# Patient Record
Sex: Female | Born: 1950 | Race: Black or African American | Hispanic: No | Smoking: Never smoker
Health system: Southern US, Community
[De-identification: ages and names within clinical notes are randomized; demographics above are authoritative.]

## PROBLEM LIST (undated history)

## (undated) DIAGNOSIS — J189 Pneumonia, unspecified organism: Secondary | ICD-10-CM

## (undated) DIAGNOSIS — R7303 Prediabetes: Secondary | ICD-10-CM

## (undated) DIAGNOSIS — I10 Essential (primary) hypertension: Secondary | ICD-10-CM

## (undated) DIAGNOSIS — D649 Anemia, unspecified: Secondary | ICD-10-CM

## (undated) DIAGNOSIS — M1990 Unspecified osteoarthritis, unspecified site: Secondary | ICD-10-CM

## (undated) DIAGNOSIS — M5430 Sciatica, unspecified side: Secondary | ICD-10-CM

## (undated) DIAGNOSIS — U071 COVID-19: Secondary | ICD-10-CM

## (undated) DIAGNOSIS — C801 Malignant (primary) neoplasm, unspecified: Secondary | ICD-10-CM

## (undated) DIAGNOSIS — N189 Chronic kidney disease, unspecified: Secondary | ICD-10-CM

## (undated) DIAGNOSIS — F419 Anxiety disorder, unspecified: Secondary | ICD-10-CM

## (undated) HISTORY — PX: COLONOSCOPY W/ POLYPECTOMY: SHX1380

## (undated) HISTORY — PX: ECTOPIC PREGNANCY SURGERY: SHX613

## (undated) HISTORY — PX: FEMUR SURGERY: SHX943

## (undated) HISTORY — DX: Essential (primary) hypertension: I10

## (undated) HISTORY — DX: Prediabetes: R73.03

## (undated) HISTORY — PX: TUBAL LIGATION: SHX77

## (undated) HISTORY — DX: COVID-19: U07.1

## (undated) MED FILL — Fosaprepitant Dimeglumine For IV Infusion 150 MG (Base Eq): INTRAVENOUS | Qty: 5 | Status: AC

---

## 2009-04-05 ENCOUNTER — Ambulatory Visit: Payer: Self-pay | Admitting: Unknown Physician Specialty

## 2009-07-04 ENCOUNTER — Ambulatory Visit: Payer: Self-pay | Admitting: Internal Medicine

## 2009-10-09 LAB — HM PAP SMEAR: HM Pap smear: NORMAL

## 2010-11-15 ENCOUNTER — Ambulatory Visit: Payer: Self-pay | Admitting: Internal Medicine

## 2010-11-29 ENCOUNTER — Ambulatory Visit: Payer: Self-pay | Admitting: Internal Medicine

## 2010-12-11 ENCOUNTER — Telehealth: Payer: Self-pay | Admitting: Internal Medicine

## 2010-12-19 NOTE — Telephone Encounter (Signed)
Please make pt a follow up appointment to discuss blood pressure issues.

## 2010-12-27 NOTE — Telephone Encounter (Signed)
Spoke with pt 12/27/10.  Pt stated bp was under control  She would call back to make appointment if she has any problems/rbh

## 2011-06-21 ENCOUNTER — Other Ambulatory Visit: Payer: Self-pay | Admitting: Internal Medicine

## 2011-06-21 NOTE — Telephone Encounter (Signed)
NEEDS OV 

## 2011-08-22 ENCOUNTER — Other Ambulatory Visit: Payer: Self-pay | Admitting: Internal Medicine

## 2011-09-06 ENCOUNTER — Telehealth: Payer: Self-pay | Admitting: Internal Medicine

## 2011-09-06 MED ORDER — LOSARTAN POTASSIUM 50 MG PO TABS: 50.0000 mg | ORAL_TABLET | Freq: Every day | ORAL | Status: DC

## 2011-09-06 NOTE — Telephone Encounter (Signed)
Patient informed, Rx done.

## 2011-09-06 NOTE — Telephone Encounter (Signed)
[phone] before5    [phone] after 5 Pt called stated she left voice mail yesterday regarding About bp meds Her ent wanted her to  discontinue  her linsipril  Because of cough and wanted dr walker prescribe something differed [facility] Pt has not taken bp meds in a week bp is going up

## 2011-09-06 NOTE — Telephone Encounter (Signed)
We can call in Losartan 50mg  daily. She needs BMP drawn next week. Follow up within 1 month.

## 2011-10-03 ENCOUNTER — Other Ambulatory Visit: Payer: Self-pay

## 2011-10-10 ENCOUNTER — Ambulatory Visit (INDEPENDENT_AMBULATORY_CARE_PROVIDER_SITE_OTHER)
Admission: RE | Admit: 2011-10-10 | Discharge: 2011-10-10 | Disposition: A | Discharge status: Home / Self Care | Payer: PRIVATE HEALTH INSURANCE | Source: Ambulatory Visit | Attending: Internal Medicine | Admitting: Internal Medicine

## 2011-10-10 ENCOUNTER — Ambulatory Visit (INDEPENDENT_AMBULATORY_CARE_PROVIDER_SITE_OTHER): Payer: PRIVATE HEALTH INSURANCE | Admitting: Internal Medicine

## 2011-10-10 ENCOUNTER — Encounter: Payer: Self-pay | Admitting: Internal Medicine

## 2011-10-10 VITALS — BP 130/90 | HR 91 | Temp 98.4°F | Ht 59.0 in | Wt 157.5 lb

## 2011-10-10 DIAGNOSIS — Z1211 Encounter for screening for malignant neoplasm of colon: Secondary | ICD-10-CM | POA: Insufficient documentation

## 2011-10-10 DIAGNOSIS — R059 Cough, unspecified: Secondary | ICD-10-CM

## 2011-10-10 DIAGNOSIS — R05 Cough: Secondary | ICD-10-CM | POA: Insufficient documentation

## 2011-10-10 DIAGNOSIS — I10 Essential (primary) hypertension: Secondary | ICD-10-CM

## 2011-10-10 DIAGNOSIS — R053 Chronic cough: Secondary | ICD-10-CM

## 2011-10-10 DIAGNOSIS — Z0000 Encounter for general adult medical examination without abnormal findings: Secondary | ICD-10-CM

## 2011-10-10 LAB — CBC WITH DIFFERENTIAL/PLATELET
Basophils Relative: 0.8 % (ref 0.0–3.0)
Eosinophils Absolute: 0 10*3/uL (ref 0.0–0.7)
Eosinophils Relative: 1.4 % (ref 0.0–5.0)
Hemoglobin: 12.6 g/dL (ref 12.0–15.0)
Lymphocytes Relative: 46.4 % — ABNORMAL HIGH (ref 12.0–46.0)
Monocytes Relative: 8.6 % (ref 3.0–12.0)
Neutro Abs: 1.5 10*3/uL (ref 1.4–7.7)
Neutrophils Relative %: 42.8 % — ABNORMAL LOW (ref 43.0–77.0)
RBC: 4.03 Mil/uL (ref 3.87–5.11)
WBC: 3.5 10*3/uL — ABNORMAL LOW (ref 4.5–10.5)

## 2011-10-10 LAB — LIPID PANEL
HDL: 66.5 mg/dL (ref >39.00)
Total CHOL/HDL Ratio: 2
Triglycerides: 73 mg/dL (ref 0.0–149.0)
VLDL: 14.6 mg/dL (ref 0.0–40.0)

## 2011-10-10 LAB — COMPREHENSIVE METABOLIC PANEL
ALT: 24 U/L (ref 0–35)
AST: 26 U/L (ref 0–37)
Alkaline Phosphatase: 87 U/L (ref 39–117)
Calcium: 9.2 mg/dL (ref 8.4–10.5)
Chloride: 110 mEq/L (ref 96–112)
Creatinine, Ser: 0.7 mg/dL (ref 0.4–1.2)
Total Bilirubin: 0.5 mg/dL (ref 0.3–1.2)

## 2011-10-10 LAB — MICROALBUMIN / CREATININE URINE RATIO
Creatinine,U: 24.8 mg/dL
Microalb Creat Ratio: 0.8 mg/g (ref 0.0–30.0)

## 2011-10-10 MED ORDER — LOSARTAN POTASSIUM 50 MG PO TABS: 50.0000 mg | ORAL_TABLET | Freq: Every day | ORAL | Status: DC

## 2011-10-10 NOTE — Assessment & Plan Note (Signed)
Blood pressure fairly well-controlled today. We'll continue to monitor. If consistently greater than 140/90, will increase dose of losartan to 100 mg. We'll check renal function with labs today. Followup in one month.

## 2011-10-10 NOTE — Assessment & Plan Note (Signed)
Patient with chronic cough which was initially evaluated by an ENT physician. Patient reports that ENT evaluation is normal. Exam today is normal. No improvement in cough with change from lisinopril to losartan. Patient has no history of smoking or exposure to tobacco smoke. However, chest x-ray today shows emphysematous changes. Will set up with pulmonary medicine for evaluation to include PFTs. Question if she might benefit from long-acting bronchodilator and steroid.

## 2011-10-10 NOTE — Progress Notes (Signed)
Subjective:    Patient ID: Victoria Foley, female    DOB: May 15, 1950, 61 y.o.   MRN: [id number]  HPI 61 year old female with history of hypertension presents for followup. In regards to her hypertension, she was recently changed from lisinopril to losartan because of concern of chronic cough. She notes that she has had a cough for several months. This is described as dry irritating cough. She denies any sputum production, chest pain, shortness of breath, fever, chills. She reports normal energy level. She is not a smoker. She has not been exposed to secondhand smoke.  In regards to her hypertension, she does not regularly check her blood pressure. She reports compliance with her losartan. She denies any chest pain, palpitations, headache.  In regards to her weight, she reports that she is making efforts at weight loss. She is starting a walking program and has been limiting her overall caloric intake.  Outpatient Encounter Prescriptions as of 10/10/2011  Medication Sig Dispense Refill  . losartan (COZAAR) 50 MG tablet Take 1 tablet (50 mg total) by mouth daily.  90 tablet  4    Review of Systems  Constitutional: Negative for fever, chills, appetite change, fatigue and unexpected weight change.  HENT: Negative for ear pain, congestion, sore throat, trouble swallowing, neck pain, voice change and sinus pressure.   Eyes: Negative for visual disturbance.  Respiratory: Positive for cough. Negative for shortness of breath, wheezing and stridor.   Cardiovascular: Negative for chest pain, palpitations and leg swelling.  Gastrointestinal: Negative for nausea, vomiting, abdominal pain, diarrhea, constipation, blood in stool, abdominal distention and anal bleeding.  Genitourinary: Negative for dysuria and flank pain.  Musculoskeletal: Negative for myalgias, arthralgias and gait problem.  Skin: Negative for color change and rash.  Neurological: Negative for dizziness and headaches.  Hematological:  Negative for adenopathy. Does not bruise/bleed easily.  Psychiatric/Behavioral: Negative for suicidal ideas, disturbed wake/sleep cycle and dysphoric mood. The patient is not nervous/anxious.    BP 130/90  Pulse 91  Temp 98.4 F (36.9 C) (Oral)  Ht 4\' 11"  (1.499 m)  Wt 157 lb 8 oz (71.442 kg)  BMI 31.81 kg/m2  SpO2 97%     Objective:   Physical Exam  Constitutional: She is oriented to person, place, and time. She appears well-developed and well-nourished. No distress.  HENT:  Head: Normocephalic and atraumatic.  Right Ear: External ear normal.  Left Ear: External ear normal.  Nose: Nose normal.  Mouth/Throat: Oropharynx is clear and moist. No oropharyngeal exudate.  Eyes: Conjunctivae are normal. Pupils are equal, round, and reactive to light. Right eye exhibits no discharge. Left eye exhibits no discharge. No scleral icterus.  Neck: Normal range of motion. Neck supple. No tracheal deviation present. No thyromegaly present.  Cardiovascular: Normal rate, regular rhythm, normal heart sounds and intact distal pulses.  Exam reveals no gallop and no friction rub.   No murmur heard. Pulmonary/Chest: Effort normal and breath sounds normal. No respiratory distress. She has no wheezes. She has no rales. She exhibits no tenderness.  Abdominal: Soft. Bowel sounds are normal. She exhibits no distension. There is no tenderness.  Musculoskeletal: Normal range of motion. She exhibits no edema and no tenderness.  Lymphadenopathy:    She has no cervical adenopathy.  Neurological: She is alert and oriented to person, place, and time. No cranial nerve deficit. She exhibits normal muscle tone. Coordination normal.  Skin: Skin is warm and dry. No rash noted. She is not diaphoretic. No erythema. No pallor.  Psychiatric: She  has a normal mood and affect. Her behavior is normal. Judgment and thought content normal.          Assessment & Plan:

## 2011-11-01 ENCOUNTER — Encounter: Payer: Self-pay | Admitting: Pulmonary Disease

## 2011-11-01 ENCOUNTER — Ambulatory Visit (INDEPENDENT_AMBULATORY_CARE_PROVIDER_SITE_OTHER): Payer: PRIVATE HEALTH INSURANCE | Admitting: Pulmonary Disease

## 2011-11-01 VITALS — BP 128/84 | HR 65 | Temp 97.9°F | Ht 59.0 in | Wt 160.0 lb

## 2011-11-01 DIAGNOSIS — R059 Cough, unspecified: Secondary | ICD-10-CM

## 2011-11-01 DIAGNOSIS — R05 Cough: Secondary | ICD-10-CM

## 2011-11-01 DIAGNOSIS — R053 Chronic cough: Secondary | ICD-10-CM

## 2011-11-01 DIAGNOSIS — J438 Other emphysema: Secondary | ICD-10-CM

## 2011-11-01 DIAGNOSIS — J439 Emphysema, unspecified: Secondary | ICD-10-CM | POA: Insufficient documentation

## 2011-11-01 NOTE — Assessment & Plan Note (Addendum)
I think that Victoria Foley's cough is likely multifactorial in nature. I explained to her today that I think that reflux, postnasal drip, and ACE inhibitor are contributed. It sounds as if increasing postnasal drip from environmental allergies started the problem and then it eventually stopped once the ACE inhibitor was stopped.  Fortunately she does not have ongoing cough now which I think is in large part due to stopping the ACE inhibitor. Today we focused on strategies to prevent cough from coming back in the future. There is generally no 1 cause of cough and in studies that have attempted to look for the etiology of cough the majority of patients have had to do for causes of cough. Our focus should be on controlling her postnasal drip and acid reflux to prevent further episodes of cough.  Plan: -Acid reflux lifestyle modifications reviewed. -If and when cough comes back she is instructed to treat her postnasal drip with Lloyd Huger med rinses, Chlor-Trimeton, and over-the-counter decongestant -If the cough recurs we will discuss performing a CT of the chest to look for emphysema as discussed below

## 2011-11-01 NOTE — Progress Notes (Signed)
Subjective:    Patient ID: Victoria Foley, female    DOB: 20-Nov-1950, 61 y.o.   MRN: [id number]  HPI Mrs. Fleece is a very pleasant 61 year old female who came to our clinic today for evaluation of cough. She states that in the spring she developed a cough after having a lot of allergies and sinus congestion. The cough persisted for several weeks and so she was referred to an ear nose and throat physician. The ENT physician said that he could find no abnormalities and recommended that she stop her ACE inhibitor. She stop the ACE and the cough went away temporary lead but then it recurred again for 2 more weeks. She has now had nearly 4 weeks without the cough and she is feeling much better. She is sent switch from lisinopril to losartan. She is not sure if switching to lisinopril helped since she still had cough after coming off of it. She states that her sinus symptoms are well controlled on Claritin and fluticasone but they were poorly controlled at the time of the onset of the cough. She also notes that she has acid reflux and states that many times a week she notes regurgitation and heartburn symptoms. She has taken Zantac in the past as needed in this is worked well. She is not familiar with which foods cause gastroesophageal reflux disease. When she had the cough she said it was dry and not associated with fever chest pain or shortness of breath. She has no respiratory complaints today.  Past Medical History  Diagnosis Date  . Hypertension      Family History  Problem Relation Age of Onset  . Diabetes Mother   . Hypertension Mother   . Heart disease Mother   . Cancer Mother     breast and stomach  . Diabetes Father   . Hypertension Father   . Heart disease Father   . Kidney disease Father      History   Social History  . Marital Status: Married    Spouse Name: N/A    Number of Children: N/A  . Years of Education: N/A   Occupational History  . Not on file.   Social History  Main Topics  . Smoking status: Never Smoker   . Smokeless tobacco: Not on file  . Alcohol Use: No  . Drug Use: Not on file  . Sexually Active: Not on file   Other Topics Concern  . Not on file   Social History Narrative   Lives in [place] with husband. No petsWork - [employer]Diet - regular dietExercise - none     Allergies  Allergen Reactions  . Diclofenac Hives     Outpatient Prescriptions Prior to Visit  Medication Sig Dispense Refill  . losartan (COZAAR) 50 MG tablet Take 1 tablet (50 mg total) by mouth daily.  90 tablet  4      Review of Systems  Constitutional: Negative for fever, chills and unexpected weight change.  HENT: Negative for ear pain, nosebleeds, congestion, sore throat, rhinorrhea, sneezing, trouble swallowing, dental problem, voice change, postnasal drip and sinus pressure.   Eyes: Negative for visual disturbance.  Respiratory: Negative for cough, choking and shortness of breath.   Cardiovascular: Negative for chest pain and leg swelling.  Gastrointestinal: Negative for vomiting, abdominal pain and diarrhea.  Genitourinary: Negative for difficulty urinating.  Musculoskeletal: Positive for arthralgias.  Skin: Negative for rash.  Neurological: Negative for tremors, syncope and headaches.  Hematological: Does not bruise/bleed easily.  Objective:   Physical Exam Filed Vitals:   11/01/11 1549  BP: 128/84  Pulse: 65  Temp: 97.9 F (36.6 C)  TempSrc: Oral  Height: 4\' 11"  (1.499 m)  Weight: 160 lb (72.576 kg)  SpO2: 100%   Gen: well appearing, no acute distress HEENT: NCAT, PERRL, EOMi, OP clear, neck supple without masses PULM: CTA B CV: RRR, no mgr, no JVD AB: BS+, soft, nontender, no hsm Ext: warm, no edema, no clubbing, no cyanosis Derm: no rash or skin breakdown Neuro: A&Ox4, CN II-XII intact, strength 5/5 in all 4 extremities  June 2013 chest x-ray: Shows hyperinflation bilaterally, likely upper lobe emphysema, and  flattened diaphragms bilaterally  11/01/2011 simple spirometry: Normal    Assessment & Plan:   Chronic cough I think that Ms. Nordstrom's cough is likely multifactorial in nature. I explained to her today that I think that reflux, postnasal drip, and ACE inhibitor are contributed. It sounds as if increasing postnasal drip from environmental allergies started the problem and then it eventually stopped once the ACE inhibitor was stopped.  Fortunately she does not have ongoing cough now which I think is in large part due to stopping the ACE inhibitor. Today we focused on strategies to prevent cough from coming back in the future. There is generally no 1 cause of cough and in studies that have attempted to look for the etiology of cough the majority of patients have had to do for causes of cough. Our focus should be on controlling her postnasal drip and acid reflux to prevent further episodes of cough.  Plan: -Acid reflux lifestyle modifications reviewed. -If and when cough comes back she is instructed to treat her postnasal drip with Lloyd Huger med rinses, Chlor-Trimeton, and over-the-counter decongestant -If the cough recurs we will discuss performing a CT of the chest to look for emphysema as discussed below   Emphysema I agree with radiology that there appears to be some degree of emphysema on her chest x-ray obtained in June 2013. However she has no symptoms of emphysema and her spirometry performed today in clinic is totally normal. I explained to her that there is a rare genetic condition (alpha 1 anti-trypsin deficiency) that can be associated with emphysema in nonsmokers. I explained to her that the best way to determine whether or not she has emphysema at this point would be to perform a CT scan of the chest and then an alpha-1 test. She said that since she's doing pretty well she would prefer to forego these tests but if she changes her mind she'll let me know. If she develops a long lasting cough  again in the future we should order the CT.   Updated Medication List Outpatient Encounter Prescriptions as of 11/01/2011  Medication Sig Dispense Refill  . losartan (COZAAR) 50 MG tablet Take 1 tablet (50 mg total) by mouth daily.  90 tablet  4  . meloxicam (MOBIC) 7.5 MG tablet Take 7.5 mg by mouth daily as needed.

## 2011-11-01 NOTE — Assessment & Plan Note (Addendum)
I agree with radiology that there appears to be some degree of emphysema on her chest x-ray obtained in June 2013. However she has no symptoms of emphysema and her spirometry performed today in clinic is totally normal. I explained to her that there is a rare genetic condition (alpha 1 anti-trypsin deficiency) that can be associated with emphysema in nonsmokers. I explained to her that the best way to determine whether or not she has emphysema at this point would be to perform a CT scan of the chest and then an alpha-1 test. She said that since she's doing pretty well she would prefer to forego these tests but if she changes her mind she'll let me know. If she develops a long lasting cough again in the future we should order the CT.

## 2011-11-01 NOTE — Patient Instructions (Signed)
Please follow the lifestyle/diet recommendations for acid reflux we've provided you. If you develop cough again, we recommend the following: Use Neil Med rinses with distilled water at least twice per day using the instructions on the package. 1/2 hour after using the [facility] Med rinse, use Nasonex two puffs in each nostril once per day. Use chlortrimeton and an over the counter decongestant (ask the pharmacist for a recommendation) as needed for the cough. If you are not better in 2 weeks, then start taking Prilosec 20mg  oral daily. Use delsym as needed for cough.  If you decide that you would like to have the CT scan of your chest to look for emphysema, then call us and we will order the test for you.  We will see you back as needed or after the CT if you decide to go that route.

## 2012-02-17 ENCOUNTER — Ambulatory Visit (INDEPENDENT_AMBULATORY_CARE_PROVIDER_SITE_OTHER): Payer: PRIVATE HEALTH INSURANCE | Admitting: Internal Medicine

## 2012-02-17 ENCOUNTER — Encounter: Payer: Self-pay | Admitting: Internal Medicine

## 2012-02-17 VITALS — BP 150/90 | HR 100 | Temp 98.5°F | Ht 59.0 in | Wt 156.0 lb

## 2012-02-17 DIAGNOSIS — I10 Essential (primary) hypertension: Secondary | ICD-10-CM

## 2012-02-17 DIAGNOSIS — R0989 Other specified symptoms and signs involving the circulatory and respiratory systems: Secondary | ICD-10-CM | POA: Insufficient documentation

## 2012-02-17 DIAGNOSIS — H6690 Otitis media, unspecified, unspecified ear: Secondary | ICD-10-CM

## 2012-02-17 DIAGNOSIS — H6693 Otitis media, unspecified, bilateral: Secondary | ICD-10-CM | POA: Insufficient documentation

## 2012-02-17 MED ORDER — AMOXICILLIN-POT CLAVULANATE 875-125 MG PO TABS: 1.0000 | ORAL_TABLET | Freq: Two times a day (BID) | ORAL | Status: DC

## 2012-02-17 NOTE — Assessment & Plan Note (Signed)
Blood pressure elevated today. Encouraged patient to limit sodium intake. She will obtain a blood pressure monitor for home. She will check her blood pressure once daily and e-mail or call with results. We discussed potentially increasing losartan to 100 mg daily. Followup in 2 weeks.

## 2012-02-17 NOTE — Progress Notes (Signed)
Subjective:    Patient ID: Victoria Foley, female    DOB: 1950/07/28, 61 y.o.   MRN: [id number]  HPI  61 year old female presents for an acute visit complaining of 2 month history of "whooshing sound "in her ears at night that wakes her from sleep. She also notes some ringing in her left ear. She was seen by ENT physician for this but reports evaluation was normal. She denies any ear pain, fever, chills. She does have some chronic nasal congestion. She also feels that this "whooshing" sound may be radiating from her neck and carotids. She is worried about carotid stenosis. She denies any dizziness, lightheadedness, focal neurologic defects. This sound is preventing her from sleeping.  Outpatient Encounter Prescriptions as of 02/17/2012  Medication Sig Dispense Refill  . losartan (COZAAR) 50 MG tablet Take 1 tablet (50 mg total) by mouth daily.  90 tablet  4  . meloxicam (MOBIC) 7.5 MG tablet Take 7.5 mg by mouth daily as needed.      [name] amoxicillin-clavulanate (AUGMENTIN) 875-125 MG per tablet Take 1 tablet by mouth 2 (two) times daily.  20 tablet  0    Review of Systems  Constitutional: Negative for fever, chills, appetite change, fatigue and unexpected weight change.  HENT: Positive for congestion and tinnitus. Negative for hearing loss, ear pain, sore throat, trouble swallowing, neck pain, voice change, sinus pressure and ear discharge.   Eyes: Negative for visual disturbance.  Respiratory: Negative for cough, shortness of breath, wheezing and stridor.   Cardiovascular: Negative for chest pain, palpitations and leg swelling.  Gastrointestinal: Negative for nausea, vomiting, abdominal pain, diarrhea, constipation, blood in stool, abdominal distention and anal bleeding.  Genitourinary: Negative for dysuria and flank pain.  Musculoskeletal: Negative for myalgias, arthralgias and gait problem.  Skin: Negative for color change and rash.  Neurological: Negative for dizziness and headaches.    Hematological: Negative for adenopathy. Does not bruise/bleed easily.  Psychiatric/Behavioral: Positive for disturbed wake/sleep cycle. Negative for suicidal ideas and dysphoric mood. The patient is not nervous/anxious.    BP 150/90  Pulse 100  Temp 98.5 F (36.9 C) (Oral)  Ht 4\' 11"  (1.499 m)  Wt 156 lb (70.761 kg)  BMI 31.51 kg/m2  SpO2 99%     Objective:   Physical Exam  Constitutional: She is oriented to person, place, and time. She appears well-developed and well-nourished. No distress.  HENT:  Head: Normocephalic and atraumatic.  Right Ear: External ear normal.  Left Ear: External ear normal.  Nose: Nose normal.  Mouth/Throat: Oropharynx is clear and moist. No oropharyngeal exudate.  Eyes: Conjunctivae normal are normal. Pupils are equal, round, and reactive to light. Right eye exhibits no discharge. Left eye exhibits no discharge. No scleral icterus.  Neck: Normal range of motion and full passive range of motion without pain. Neck supple. Carotid bruit is not present. No tracheal deviation present. No mass and no thyromegaly present.  Cardiovascular: Normal rate, regular rhythm, normal heart sounds and intact distal pulses.  Exam reveals no gallop and no friction rub.   No murmur heard. Pulmonary/Chest: Effort normal and breath sounds normal. No respiratory distress. She has no wheezes. She has no rales. She exhibits no tenderness.  Musculoskeletal: Normal range of motion. She exhibits no edema and no tenderness.  Lymphadenopathy:    She has no cervical adenopathy.  Neurological: She is alert and oriented to person, place, and time. No cranial nerve deficit. She exhibits normal muscle tone. Coordination normal.  Skin: Skin is warm and  dry. No rash noted. She is not diaphoretic. No erythema. No pallor.  Psychiatric: She has a normal mood and affect. Her behavior is normal. Judgment and thought content normal.          Assessment & Plan:

## 2012-02-17 NOTE — Assessment & Plan Note (Signed)
Exam remarkable for bilateral middle ear effusions. Question if this may be contributing to "whooshing" sound that pt hears. Will treat with augmentin and OTC claritin to see if any improvement.

## 2012-02-17 NOTE — Assessment & Plan Note (Signed)
Patient complains of a "whooshing "sound over her neck and ears. She is concerned that this may be related to carotid artery stenosis. No audible sound heard on exam today. However, given risk factors of hypertension, will set up carotid Dopplers for evaluation.

## 2012-02-25 ENCOUNTER — Encounter: Payer: Self-pay | Admitting: Internal Medicine

## 2012-02-25 MED ORDER — AZITHROMYCIN 250 MG PO TABS: ORAL_TABLET | ORAL | Status: DC

## 2012-02-26 ENCOUNTER — Encounter: Payer: Self-pay | Admitting: Internal Medicine

## 2012-03-13 ENCOUNTER — Ambulatory Visit: Payer: PRIVATE HEALTH INSURANCE

## 2012-03-13 ENCOUNTER — Ambulatory Visit (INDEPENDENT_AMBULATORY_CARE_PROVIDER_SITE_OTHER): Payer: PRIVATE HEALTH INSURANCE | Admitting: Internal Medicine

## 2012-03-13 ENCOUNTER — Encounter: Payer: Self-pay | Admitting: Internal Medicine

## 2012-03-13 VITALS — BP 140/90 | HR 100 | Temp 97.9°F | Resp 16 | Wt 156.0 lb

## 2012-03-13 DIAGNOSIS — Z634 Disappearance and death of family member: Secondary | ICD-10-CM

## 2012-03-13 DIAGNOSIS — I10 Essential (primary) hypertension: Secondary | ICD-10-CM

## 2012-03-13 MED ORDER — LOSARTAN POTASSIUM 100 MG PO TABS: 100.0000 mg | ORAL_TABLET | Freq: Every day | ORAL | Status: DC

## 2012-03-13 NOTE — Progress Notes (Signed)
  Subjective:    Patient ID: Victoria Foley, female    DOB: [birth year], 61 y.o.   MRN: [id number]  HPI 61 year old female with history of hypertension presents for followup. She has not been regularly checking blood pressure at home. She denies any headache, chest pain, palpitations. She reports compliance with Cozaar 50 mg daily.  She notes that her niece recently died unexpectedly at age 22 after an infection in the setting of diabetes. She describes some depressed mood and anxiety related to this event.     Outpatient Prescriptions Prior to Visit  Medication Sig Dispense Refill  . losartan (COZAAR) 50 MG tablet Take 1 tablet (50 mg total) by mouth daily.  90 tablet  4  . meloxicam (MOBIC) 7.5 MG tablet Take 7.5 mg by mouth daily as needed.        BP 140/90  Pulse 100  Temp 97.9 F (36.6 C) (Oral)  Resp 16  Wt 156 lb (70.761 kg)  Review of Systems  Constitutional: Negative for fever, chills, appetite change, fatigue and unexpected weight change.  HENT: Negative for ear pain, congestion, sore throat, trouble swallowing, neck pain, voice change and sinus pressure.   Eyes: Negative for visual disturbance.  Respiratory: Negative for cough, shortness of breath, wheezing and stridor.   Cardiovascular: Negative for chest pain, palpitations and leg swelling.  Gastrointestinal: Negative for nausea, vomiting, abdominal pain, diarrhea, constipation, blood in stool, abdominal distention and anal bleeding.  Genitourinary: Negative for dysuria and flank pain.  Musculoskeletal: Negative for myalgias, arthralgias and gait problem.  Skin: Negative for color change and rash.  Neurological: Negative for dizziness and headaches.  Hematological: Negative for adenopathy. Does not bruise/bleed easily.  Psychiatric/Behavioral: Positive for dysphoric mood. Negative for suicidal ideas and sleep disturbance. The patient is nervous/anxious.        Objective:   Physical Exam  Constitutional: She is  oriented to person, place, and time. She appears well-developed and well-nourished. No distress.  HENT:  Head: Normocephalic and atraumatic.  Right Ear: External ear normal.  Left Ear: External ear normal.  Nose: Nose normal.  Mouth/Throat: Oropharynx is clear and moist. No oropharyngeal exudate.  Eyes: Conjunctivae normal are normal. Pupils are equal, round, and reactive to light. Right eye exhibits no discharge. Left eye exhibits no discharge. No scleral icterus.  Neck: Normal range of motion. Neck supple. No tracheal deviation present. No thyromegaly present.  Cardiovascular: Normal rate, regular rhythm, normal heart sounds and intact distal pulses.  Exam reveals no gallop and no friction rub.   No murmur heard. Pulmonary/Chest: Effort normal and breath sounds normal. No respiratory distress. She has no wheezes. She has no rales. She exhibits no tenderness.  Musculoskeletal: Normal range of motion. She exhibits no edema and no tenderness.  Lymphadenopathy:    She has no cervical adenopathy.  Neurological: She is alert and oriented to person, place, and time. No cranial nerve deficit. She exhibits normal muscle tone. Coordination normal.  Skin: Skin is warm and dry. No rash noted. She is not diaphoretic. No erythema. No pallor.  Psychiatric: Her behavior is normal. Judgment and thought content normal. She exhibits a depressed mood.          Assessment & Plan:

## 2012-03-13 NOTE — Assessment & Plan Note (Addendum)
Patient's niece recently passed away unexpectedly. Offered support today. Will set up counseling as needed. Follow up 4 weeks.

## 2012-03-13 NOTE — Assessment & Plan Note (Signed)
Blood pressure continues to be elevated today. Will increase losartan to 100 mg daily. Will recheck renal function including creatinine and potassium level in one week. Nurse check for blood pressure in one week. Followup in 4 weeks.

## 2012-03-20 ENCOUNTER — Other Ambulatory Visit: Payer: PRIVATE HEALTH INSURANCE

## 2012-06-19 ENCOUNTER — Encounter: Payer: Self-pay | Admitting: Internal Medicine

## 2012-09-23 ENCOUNTER — Other Ambulatory Visit: Payer: Self-pay | Admitting: Internal Medicine

## 2012-09-24 MED ORDER — FLUTICASONE PROPIONATE 50 MCG/ACT NA SUSP: 2.0000 | Freq: Every day | NASAL | Status: DC

## 2012-09-24 NOTE — Telephone Encounter (Signed)
Please Advise.... Patient was last seen in 02/2012 and its not on her medication list

## 2012-11-06 ENCOUNTER — Encounter: Payer: Self-pay | Admitting: Internal Medicine

## 2013-02-25 ENCOUNTER — Other Ambulatory Visit: Payer: Self-pay

## 2013-03-16 ENCOUNTER — Encounter: Payer: Self-pay | Admitting: Internal Medicine

## 2013-04-06 ENCOUNTER — Other Ambulatory Visit: Payer: Self-pay | Admitting: Internal Medicine

## 2013-04-06 DIAGNOSIS — I10 Essential (primary) hypertension: Secondary | ICD-10-CM

## 2013-04-06 NOTE — Telephone Encounter (Signed)
Okay to refill? Last seen in November 2013

## 2013-04-06 NOTE — Telephone Encounter (Signed)
No. Needs a follow up visit. We can work her in this week.

## 2013-04-08 MED ORDER — LOSARTAN POTASSIUM 100 MG PO TABS: 100.0000 mg | ORAL_TABLET | Freq: Every day | ORAL | Status: DC

## 2013-04-08 NOTE — Telephone Encounter (Signed)
Left message to call back  

## 2013-04-08 NOTE — Telephone Encounter (Signed)
Spoke with patient, she is totally out of her medication. Where can she be worked in at?

## 2013-04-08 NOTE — Telephone Encounter (Signed)
Patient returned call, informed her Dr. Dan Humphreys would send her in 1 month supply but she must schedule an appointment. Per patient she was at a funeral at the moment but she will call back tomorrow to schedule that appointment. Rx sent to pharmacy on file.

## 2013-04-08 NOTE — Addendum Note (Signed)
Addended by: Theola Sequin on: 04/08/2013 02:48 PM   Modules accepted: Orders

## 2013-04-08 NOTE — Telephone Encounter (Signed)
Let's refill x 1 month, then make an appointment in 1-2 weeks, first available slot.

## 2013-05-05 ENCOUNTER — Other Ambulatory Visit: Payer: Self-pay | Admitting: Internal Medicine

## 2013-05-05 NOTE — Telephone Encounter (Signed)
Appt 05/31/13

## 2013-05-07 ENCOUNTER — Other Ambulatory Visit: Payer: Self-pay | Admitting: Internal Medicine

## 2013-05-31 ENCOUNTER — Encounter: Payer: PRIVATE HEALTH INSURANCE | Admitting: Internal Medicine

## 2013-06-01 LAB — HM PAP SMEAR: HM Pap smear: NEGATIVE

## 2013-06-01 LAB — HM MAMMOGRAPHY

## 2013-06-02 ENCOUNTER — Ambulatory Visit (INDEPENDENT_AMBULATORY_CARE_PROVIDER_SITE_OTHER): Payer: BC Managed Care – PPO | Admitting: Internal Medicine

## 2013-06-02 ENCOUNTER — Other Ambulatory Visit (HOSPITAL_COMMUNITY)
Admission: RE | Admit: 2013-06-02 | Discharge: 2013-06-02 | Disposition: A | Discharge status: Home / Self Care | Payer: BC Managed Care – PPO | Source: Ambulatory Visit | Attending: Internal Medicine | Admitting: Internal Medicine

## 2013-06-02 ENCOUNTER — Encounter: Payer: Self-pay | Admitting: Internal Medicine

## 2013-06-02 VITALS — BP 150/90 | HR 100 | Temp 97.9°F | Ht <= 58 in | Wt 157.0 lb

## 2013-06-02 DIAGNOSIS — Z1151 Encounter for screening for human papillomavirus (HPV): Secondary | ICD-10-CM | POA: Insufficient documentation

## 2013-06-02 DIAGNOSIS — Z01419 Encounter for gynecological examination (general) (routine) without abnormal findings: Secondary | ICD-10-CM | POA: Insufficient documentation

## 2013-06-02 DIAGNOSIS — Z0000 Encounter for general adult medical examination without abnormal findings: Secondary | ICD-10-CM

## 2013-06-02 DIAGNOSIS — I10 Essential (primary) hypertension: Secondary | ICD-10-CM

## 2013-06-02 LAB — CBC WITH DIFFERENTIAL/PLATELET
Basophils Absolute: 0 10*3/uL (ref 0.0–0.1)
Basophils Relative: 0.7 % (ref 0.0–3.0)
Eosinophils Absolute: 0.1 10*3/uL (ref 0.0–0.7)
Eosinophils Relative: 1.8 % (ref 0.0–5.0)
HCT: 40.9 % (ref 36.0–46.0)
HEMOGLOBIN: 13.4 g/dL (ref 12.0–15.0)
LYMPHS ABS: 1.5 10*3/uL (ref 0.7–4.0)
Lymphocytes Relative: 37.2 % (ref 12.0–46.0)
MCHC: 32.7 g/dL (ref 30.0–36.0)
MCV: 96.6 fl (ref 78.0–100.0)
Monocytes Absolute: 0.3 10*3/uL (ref 0.1–1.0)
Monocytes Relative: 6.6 % (ref 3.0–12.0)
Neutro Abs: 2.2 10*3/uL (ref 1.4–7.7)
Neutrophils Relative %: 53.7 % (ref 43.0–77.0)
Platelets: 275 10*3/uL (ref 150.0–400.0)
RBC: 4.24 Mil/uL (ref 3.87–5.11)
RDW: 13.4 % (ref 11.5–14.6)
WBC: 4 10*3/uL — ABNORMAL LOW (ref 4.5–10.5)

## 2013-06-02 LAB — COMPREHENSIVE METABOLIC PANEL
ALT: 16 U/L (ref 0–35)
AST: 20 U/L (ref 0–37)
Albumin: 4.1 g/dL (ref 3.5–5.2)
Alkaline Phosphatase: 83 U/L (ref 39–117)
BILIRUBIN TOTAL: 0.9 mg/dL (ref 0.3–1.2)
BUN: 8 mg/dL (ref 6–23)
CO2: 26 meq/L (ref 19–32)
Calcium: 9.2 mg/dL (ref 8.4–10.5)
Chloride: 108 mEq/L (ref 96–112)
Creatinine, Ser: 0.8 mg/dL (ref 0.4–1.2)
GFR: 99.31 mL/min (ref >60.00)
Glucose, Bld: 97 mg/dL (ref 70–99)
Potassium: 3.9 mEq/L (ref 3.5–5.1)
Sodium: 143 mEq/L (ref 135–145)
Total Protein: 7.4 g/dL (ref 6.0–8.3)

## 2013-06-02 LAB — LIPID PANEL
CHOL/HDL RATIO: 2
Cholesterol: 142 mg/dL (ref 0–200)
HDL: 57.3 mg/dL (ref >39.00)
LDL CALC: 73 mg/dL (ref 0–99)
Triglycerides: 58 mg/dL (ref 0.0–149.0)
VLDL: 11.6 mg/dL (ref 0.0–40.0)

## 2013-06-02 LAB — MICROALBUMIN / CREATININE URINE RATIO
Creatinine,U: 77.6 mg/dL
Microalb Creat Ratio: 0.3 mg/g (ref 0.0–30.0)
Microalb, Ur: 0.2 mg/dL (ref 0.0–1.9)

## 2013-06-02 LAB — HM COLONOSCOPY

## 2013-06-02 MED ORDER — LOSARTAN POTASSIUM 100 MG PO TABS: ORAL_TABLET | ORAL | Status: DC

## 2013-06-02 NOTE — Assessment & Plan Note (Signed)
BP Readings from Last 3 Encounters:  06/02/13 150/90  03/13/12 140/90  02/17/12 150/90   BP elevated today, however pt reports better control at home. Will have her monitor and RTC for nurse BP check in next few weeks. If persistently above 140/90, would favor adding Amlodipine.

## 2013-06-02 NOTE — Progress Notes (Signed)
Subjective:    Patient ID: Victoria Foley, female    DOB: 1951-02-28, 63 y.o.   MRN: [id number]  HPI 63YO female presents for annual exam. She is generally feeling well. She notes some increased stress recently dealing with some marital issues. She reports feeling safe where she lives and denies any physical or emotional abuse. She has no concerns today. She is trying to follow a healthy diet. She is not currently exercising, but planning to increase walking.   Review of Systems  Constitutional: Negative for fever, chills, appetite change, fatigue and unexpected weight change.  HENT: Negative for congestion, ear pain, sinus pressure, sore throat, trouble swallowing and voice change.   Eyes: Negative for visual disturbance.  Respiratory: Negative for cough, shortness of breath, wheezing and stridor.   Cardiovascular: Negative for chest pain, palpitations and leg swelling.  Gastrointestinal: Negative for nausea, vomiting, abdominal pain, diarrhea, constipation, blood in stool, abdominal distention and anal bleeding.  Genitourinary: Negative for dysuria and flank pain.  Musculoskeletal: Negative for arthralgias, gait problem, myalgias and neck pain.  Skin: Negative for color change and rash.  Neurological: Negative for dizziness and headaches.  Hematological: Negative for adenopathy. Does not bruise/bleed easily.  Psychiatric/Behavioral: Negative for suicidal ideas, sleep disturbance and dysphoric mood. The patient is not nervous/anxious.        Objective:    BP 150/90  Pulse 100  Temp(Src) 97.9 F (36.6 C) (Oral)  Ht 4\' 10"  (1.473 m)  Wt 157 lb (71.215 kg)  BMI 32.82 kg/m2  SpO2 98% Physical Exam  Constitutional: She is oriented to person, place, and time. She appears well-developed and well-nourished. No distress.  HENT:  Head: Normocephalic and atraumatic.  Right Ear: External ear normal.  Left Ear: External ear normal.  Nose: Nose normal.  Mouth/Throat: Oropharynx is clear  and moist. No oropharyngeal exudate.  Eyes: Conjunctivae are normal. Pupils are equal, round, and reactive to light. Right eye exhibits no discharge. Left eye exhibits no discharge. No scleral icterus.  Neck: Normal range of motion. Neck supple. No tracheal deviation present. No thyromegaly present.  Cardiovascular: Normal rate, regular rhythm, normal heart sounds and intact distal pulses.  Exam reveals no gallop and no friction rub.   No murmur heard. Pulmonary/Chest: Effort normal and breath sounds normal. No respiratory distress. She has no wheezes. She has no rales. She exhibits no tenderness.  Abdominal: Soft. Bowel sounds are normal. She exhibits no distension and no mass. There is no tenderness. There is no rebound and no guarding.  Genitourinary: Rectum normal, vagina normal and uterus normal. No breast swelling, tenderness, discharge or bleeding. Pelvic exam was performed with patient supine. There is no rash, tenderness or lesion on the right labia. There is no rash, tenderness or lesion on the left labia. Uterus is not enlarged and not tender. Cervix exhibits no motion tenderness, no discharge and no friability. Right adnexum displays no mass, no tenderness and no fullness. Left adnexum displays no mass, no tenderness and no fullness. No erythema or tenderness around the vagina. No vaginal discharge found.  Musculoskeletal: Normal range of motion. She exhibits no edema and no tenderness.  Lymphadenopathy:    She has no cervical adenopathy.  Neurological: She is alert and oriented to person, place, and time. No cranial nerve deficit. She exhibits normal muscle tone. Coordination normal.  Skin: Skin is warm and dry. No rash noted. She is not diaphoretic. No erythema. No pallor.  Psychiatric: She has a normal mood and affect. Her behavior  is normal. Judgment and thought content normal.          Assessment & Plan:   Problem List Items Addressed This Visit   None       No Follow-up  on file.

## 2013-06-02 NOTE — Progress Notes (Signed)
Pre-visit discussion using our clinic review tool. No additional management support is needed unless otherwise documented below in the visit note.  

## 2013-06-02 NOTE — Assessment & Plan Note (Signed)
General medical exam normal today including breast and pelvic exam. PAP pending. Encouraged healthy diet and regular physical activity. Suggested counseling given recent marital issues, but she declines. She declines flu vaccine. Labs today including CBC, CMP, lipids. Follow up 1 month to recheck BP.

## 2013-06-03 ENCOUNTER — Telehealth: Payer: Self-pay | Admitting: Internal Medicine

## 2013-06-03 LAB — VITAMIN D 25 HYDROXY (VIT D DEFICIENCY, FRACTURES): Vit D, 25-Hydroxy: 29 ng/mL — ABNORMAL LOW (ref 30–89)

## 2013-06-03 NOTE — Telephone Encounter (Signed)
Relevant patient education assigned to patient using Emmi. ° °

## 2013-06-04 ENCOUNTER — Encounter: Payer: Self-pay | Admitting: Emergency Medicine

## 2013-06-14 ENCOUNTER — Encounter: Payer: Self-pay | Admitting: Internal Medicine

## 2013-06-29 ENCOUNTER — Ambulatory Visit: Payer: Self-pay | Admitting: Internal Medicine

## 2013-07-19 ENCOUNTER — Encounter: Payer: Self-pay | Admitting: Internal Medicine

## 2013-07-21 LAB — HM COLONOSCOPY

## 2013-08-06 ENCOUNTER — Encounter: Payer: Self-pay | Admitting: *Deleted

## 2013-12-02 ENCOUNTER — Encounter: Payer: Self-pay | Admitting: Internal Medicine

## 2013-12-02 ENCOUNTER — Ambulatory Visit (INDEPENDENT_AMBULATORY_CARE_PROVIDER_SITE_OTHER): Payer: BC Managed Care – PPO | Admitting: Internal Medicine

## 2013-12-02 VITALS — BP 130/80 | HR 91 | Temp 98.2°F | Ht <= 58 in | Wt 156.0 lb

## 2013-12-02 DIAGNOSIS — I10 Essential (primary) hypertension: Secondary | ICD-10-CM

## 2013-12-02 NOTE — Patient Instructions (Signed)
Continue current medications    Follow up in 6 months

## 2013-12-02 NOTE — Progress Notes (Signed)
Pre visit review using our clinic review tool, if applicable. No additional management support is needed unless otherwise documented below in the visit note. 

## 2013-12-02 NOTE — Assessment & Plan Note (Addendum)
BP Readings from Last 3 Encounters:  12/02/13 130/80  06/02/13 150/90  03/13/12 140/90   BP well controlled. Continue current medications. Renal function with labs today.

## 2013-12-02 NOTE — Progress Notes (Signed)
   Subjective:    Patient ID: Victoria Foley, female    DOB: [birth year], 63 y.o.   MRN: [id number]  HPI 63YO female presents for follow up.  HTN - BP running 140/60 at home. No chest pain, headache. Compliant with medications. No new concerns today. Feeling well. Going through counseling with her husband which has been helpful.   Review of Systems  Constitutional: Negative for fever, chills, appetite change, fatigue and unexpected weight change.  Eyes: Negative for visual disturbance.  Respiratory: Negative for shortness of breath.   Cardiovascular: Negative for chest pain, palpitations and leg swelling.  Gastrointestinal: Negative for abdominal pain.  Skin: Negative for color change and rash.  Hematological: Negative for adenopathy. Does not bruise/bleed easily.  Psychiatric/Behavioral: Negative for dysphoric mood. The patient is not nervous/anxious.        Objective:    BP 130/80  Pulse 91  Temp(Src) 98.2 F (36.8 C) (Oral)  Ht 4\' 10"  (1.473 m)  Wt 156 lb (70.761 kg)  BMI 32.61 kg/m2  SpO2 99% Physical Exam  Constitutional: She is oriented to person, place, and time. She appears well-developed and well-nourished. No distress.  HENT:  Head: Normocephalic and atraumatic.  Right Ear: External ear normal.  Left Ear: External ear normal.  Nose: Nose normal.  Mouth/Throat: Oropharynx is clear and moist. No oropharyngeal exudate.  Eyes: Conjunctivae are normal. Pupils are equal, round, and reactive to light. Right eye exhibits no discharge. Left eye exhibits no discharge. No scleral icterus.  Neck: Normal range of motion. Neck supple. No tracheal deviation present. No thyromegaly present.  Cardiovascular: Normal rate, regular rhythm, normal heart sounds and intact distal pulses.  Exam reveals no gallop and no friction rub.   No murmur heard. Pulmonary/Chest: Effort normal and breath sounds normal. No accessory muscle usage. Not tachypneic. No respiratory distress. She has no  decreased breath sounds. She has no wheezes. She has no rhonchi. She has no rales. She exhibits no tenderness.  Musculoskeletal: Normal range of motion. She exhibits no edema and no tenderness.  Lymphadenopathy:    She has no cervical adenopathy.  Neurological: She is alert and oriented to person, place, and time. No cranial nerve deficit. She exhibits normal muscle tone. Coordination normal.  Skin: Skin is warm and dry. No rash noted. She is not diaphoretic. No erythema. No pallor.  Psychiatric: She has a normal mood and affect. Her behavior is normal. Judgment and thought content normal.          Assessment & Plan:   Problem List Items Addressed This Visit     Unprioritized   Hypertension - Primary      BP Readings from Last 3 Encounters:  12/02/13 130/80  06/02/13 150/90  03/13/12 140/90   BP well controlled. Continue current medications. Renal function with labs today.    Relevant Orders      Microalbumin / creatinine urine ratio      Comprehensive metabolic panel       Return in about 6 months (around 06/04/2014) for Physical.

## 2013-12-03 LAB — COMPREHENSIVE METABOLIC PANEL
ALT: 18 U/L (ref 0–35)
AST: 20 U/L (ref 0–37)
Albumin: 4 g/dL (ref 3.5–5.2)
Alkaline Phosphatase: 73 U/L (ref 39–117)
BILIRUBIN TOTAL: 0.7 mg/dL (ref 0.2–1.2)
BUN: 14 mg/dL (ref 6–23)
CO2: 27 mEq/L (ref 19–32)
CREATININE: 0.8 mg/dL (ref 0.4–1.2)
Calcium: 9.5 mg/dL (ref 8.4–10.5)
Chloride: 106 mEq/L (ref 96–112)
GFR: 99.14 mL/min (ref >60.00)
Glucose, Bld: 97 mg/dL (ref 70–99)
Potassium: 4 mEq/L (ref 3.5–5.1)
Sodium: 136 mEq/L (ref 135–145)
Total Protein: 6.7 g/dL (ref 6.0–8.3)

## 2013-12-03 LAB — MICROALBUMIN / CREATININE URINE RATIO
CREATININE, U: 84.5 mg/dL
MICROALB UR: 0.2 mg/dL (ref 0.0–1.9)
MICROALB/CREAT RATIO: 0.2 mg/g (ref 0.0–30.0)

## 2013-12-16 ENCOUNTER — Ambulatory Visit (INDEPENDENT_AMBULATORY_CARE_PROVIDER_SITE_OTHER): Payer: BC Managed Care – PPO | Admitting: Adult Health

## 2013-12-16 ENCOUNTER — Encounter: Payer: Self-pay | Admitting: Adult Health

## 2013-12-16 VITALS — BP 143/82 | HR 120 | Temp 98.6°F | Resp 14 | Wt 154.5 lb

## 2013-12-16 DIAGNOSIS — R300 Dysuria: Secondary | ICD-10-CM | POA: Insufficient documentation

## 2013-12-16 DIAGNOSIS — N390 Urinary tract infection, site not specified: Secondary | ICD-10-CM

## 2013-12-16 LAB — POCT URINALYSIS DIPSTICK
BILIRUBIN UA: NEGATIVE
GLUCOSE UA: NEGATIVE
Ketones, UA: NEGATIVE
Nitrite, UA: NEGATIVE
Protein, UA: NEGATIVE
Urobilinogen, UA: 0.2
pH, UA: 5.5

## 2013-12-16 MED ORDER — CIPROFLOXACIN HCL 500 MG PO TABS: 500.0000 mg | ORAL_TABLET | Freq: Two times a day (BID) | ORAL | Status: DC

## 2013-12-16 NOTE — Progress Notes (Signed)
Patient ID: Victoria Foley, female   DOB: 12/14/1950, 63 y.o.   MRN: [id number]   Subjective:    Patient ID: Victoria Foley, female    DOB: Sep 17, 1950, 63 y.o.   MRN: [id number]  HPI  Pt is a 63 year old female who presents to clinic with dysuria. She noticed hematuria last evening. Has not taken any over-the-counter products. No fever, chills or flank pain reported.  Past Medical History  Diagnosis Date  . Hypertension     Current Outpatient Prescriptions on File Prior to Visit  Medication Sig Dispense Refill  . fluticasone (FLONASE) 50 MCG/ACT nasal spray Place 2 sprays into the nose daily.  16 g  6  . losartan (COZAAR) 100 MG tablet TAKE ONE TABLET BY MOUTH ONCE DAILY  90 tablet  3  . UNABLE TO FIND [place] Herbal tea      . Calcium-Magnesium-Vitamin D (CALCIUM MAGNESIUM PO) Take by mouth.      . Cholecalciferol (VITAMIN D-3 PO) Take by mouth.       No current facility-administered medications on file prior to visit.     Review of Systems  Constitutional: Negative for fever and chills.  Genitourinary: Positive for dysuria, urgency, frequency and hematuria. Negative for flank pain.       Objective:  BP 143/82  Pulse 120  Temp(Src) 98.6 F (37 C) (Oral)  Resp 14  Wt 154 lb 8 oz (70.081 kg)  SpO2 99%   Physical Exam  Constitutional: She is oriented to person, place, and time. No distress.  Cardiovascular: Normal rate and regular rhythm.   Pulmonary/Chest: Effort normal. No respiratory distress.  Musculoskeletal: Normal range of motion.  Neurological: She is alert and oriented to person, place, and time.  Skin: Skin is warm and dry.  Psychiatric: She has a normal mood and affect. Her behavior is normal. Judgment and thought content normal.      Assessment & Plan:   1. Dysuria Start Cipro 500 mg bid x 7 days. AZO for urinary symptoms Return 3 days post antibiotic for urinalysis to make sure blood has cleared. Tylenol for fever, chills or discomfort prn - POCT  urinalysis dipstick - CULTURE, URINE COMPREHENSIVE

## 2013-12-16 NOTE — Patient Instructions (Signed)
  Start Cipro 500 mg twice a day for 7 days.  You may try over-the-counter AZO for urinary symptoms of burning or pain with urination.  You may take Tylenol for fever or chills.  You will need to return 3 days after completing the antibiotics for a repeat urinalysis to make sure that there is no more blood in the urine.

## 2013-12-19 LAB — CULTURE, URINE COMPREHENSIVE

## 2013-12-20 ENCOUNTER — Encounter: Payer: Self-pay | Admitting: Adult Health

## 2013-12-22 NOTE — Telephone Encounter (Signed)
Called pt, notified of results and verbalized understanding.

## 2013-12-28 ENCOUNTER — Other Ambulatory Visit (INDEPENDENT_AMBULATORY_CARE_PROVIDER_SITE_OTHER): Payer: BC Managed Care – PPO

## 2013-12-28 ENCOUNTER — Telehealth: Payer: Self-pay | Admitting: *Deleted

## 2013-12-28 ENCOUNTER — Encounter: Payer: Self-pay | Admitting: Internal Medicine

## 2013-12-28 DIAGNOSIS — N390 Urinary tract infection, site not specified: Secondary | ICD-10-CM

## 2013-12-28 LAB — POCT URINALYSIS DIPSTICK
Bilirubin, UA: NEGATIVE
Glucose, UA: NEGATIVE
Ketones, UA: NEGATIVE
Nitrite, UA: NEGATIVE
PROTEIN UA: NEGATIVE
Spec Grav, UA: 1.015
Urobilinogen, UA: 0.2
pH, UA: 5

## 2013-12-28 NOTE — Telephone Encounter (Signed)
What labs and dx?  

## 2013-12-28 NOTE — Telephone Encounter (Signed)
Urinalysis for hematuria.

## 2013-12-30 ENCOUNTER — Encounter: Payer: Self-pay | Admitting: Internal Medicine

## 2013-12-30 LAB — URINE CULTURE: Colony Count: 3000

## 2014-04-03 ENCOUNTER — Other Ambulatory Visit: Payer: Self-pay | Admitting: Internal Medicine

## 2014-06-05 ENCOUNTER — Other Ambulatory Visit: Payer: Self-pay | Admitting: Internal Medicine

## 2014-06-06 ENCOUNTER — Encounter: Payer: BC Managed Care – PPO | Admitting: Internal Medicine

## 2014-06-07 ENCOUNTER — Ambulatory Visit (INDEPENDENT_AMBULATORY_CARE_PROVIDER_SITE_OTHER): Payer: BC Managed Care – PPO | Admitting: Internal Medicine

## 2014-06-07 ENCOUNTER — Encounter: Payer: Self-pay | Admitting: Internal Medicine

## 2014-06-07 VITALS — BP 136/78 | HR 110 | Temp 98.0°F | Ht <= 58 in | Wt 163.0 lb

## 2014-06-07 DIAGNOSIS — L309 Dermatitis, unspecified: Secondary | ICD-10-CM

## 2014-06-07 DIAGNOSIS — I10 Essential (primary) hypertension: Secondary | ICD-10-CM

## 2014-06-07 DIAGNOSIS — Z0000 Encounter for general adult medical examination without abnormal findings: Secondary | ICD-10-CM

## 2014-06-07 LAB — LIPID PANEL
CHOL/HDL RATIO: 3
Cholesterol: 151 mg/dL (ref 0–200)
HDL: 59.5 mg/dL (ref >39.00)
LDL CALC: 80 mg/dL (ref 0–99)
NonHDL: 91.5
Triglycerides: 56 mg/dL (ref 0.0–149.0)
VLDL: 11.2 mg/dL (ref 0.0–40.0)

## 2014-06-07 LAB — COMPREHENSIVE METABOLIC PANEL
ALT: 17 U/L (ref 0–35)
AST: 22 U/L (ref 0–37)
Albumin: 4 g/dL (ref 3.5–5.2)
Alkaline Phosphatase: 87 U/L (ref 39–117)
BILIRUBIN TOTAL: 0.5 mg/dL (ref 0.2–1.2)
BUN: 10 mg/dL (ref 6–23)
CALCIUM: 9.1 mg/dL (ref 8.4–10.5)
CHLORIDE: 108 meq/L (ref 96–112)
CO2: 27 mEq/L (ref 19–32)
CREATININE: 0.77 mg/dL (ref 0.40–1.20)
GFR: 97.5 mL/min (ref >60.00)
Glucose, Bld: 98 mg/dL (ref 70–99)
Potassium: 3.8 mEq/L (ref 3.5–5.1)
Sodium: 138 mEq/L (ref 135–145)
Total Protein: 7.3 g/dL (ref 6.0–8.3)

## 2014-06-07 LAB — CBC WITH DIFFERENTIAL/PLATELET
BASOS PCT: 0.6 % (ref 0.0–3.0)
Basophils Absolute: 0 10*3/uL (ref 0.0–0.1)
EOS ABS: 0 10*3/uL (ref 0.0–0.7)
EOS PCT: 1.6 % (ref 0.0–5.0)
HCT: 38.9 % (ref 36.0–46.0)
HEMOGLOBIN: 13.2 g/dL (ref 12.0–15.0)
Lymphocytes Relative: 54.9 % — ABNORMAL HIGH (ref 12.0–46.0)
Lymphs Abs: 1.5 10*3/uL (ref 0.7–4.0)
MCHC: 33.9 g/dL (ref 30.0–36.0)
MCV: 92.1 fl (ref 78.0–100.0)
Monocytes Absolute: 0.2 10*3/uL (ref 0.1–1.0)
Monocytes Relative: 7.3 % (ref 3.0–12.0)
NEUTROS ABS: 1 10*3/uL — AB (ref 1.4–7.7)
NEUTROS PCT: 35.6 % — AB (ref 43.0–77.0)
Platelets: 244 10*3/uL (ref 150.0–400.0)
RBC: 4.23 Mil/uL (ref 3.87–5.11)
RDW: 13.3 % (ref 11.5–15.5)
WBC: 2.8 10*3/uL — AB (ref 4.0–10.5)

## 2014-06-07 LAB — MICROALBUMIN / CREATININE URINE RATIO
CREATININE, U: 14.7 mg/dL
MICROALB/CREAT RATIO: 4.8 mg/g (ref 0.0–30.0)
Microalb, Ur: <0.7 mg/dL (ref 0.0–1.9)

## 2014-06-07 LAB — HEMOGLOBIN A1C: HEMOGLOBIN A1C: 6.2 % (ref 4.6–6.5)

## 2014-06-07 LAB — VITAMIN D 25 HYDROXY (VIT D DEFICIENCY, FRACTURES): VITD: 31.41 ng/mL (ref 30.00–100.00)

## 2014-06-07 MED ORDER — LOSARTAN POTASSIUM 100 MG PO TABS: 100.0000 mg | ORAL_TABLET | Freq: Every day | ORAL | Status: DC

## 2014-06-07 MED ORDER — TRIAMCINOLONE ACETONIDE 0.1 % EX CREA: 1.0000 "application " | TOPICAL_CREAM | Freq: Two times a day (BID) | CUTANEOUS | Status: DC

## 2014-06-07 NOTE — Assessment & Plan Note (Addendum)
General medical exam normal today including breast exam. PAP and pelvic deferred as PAP normal, HPV neg in 2015. Colonoscopy UTD. Immunizations UTD except for flu vaccine which is declined. Will check labs today including CBC, CMP, lipids, TSH.

## 2014-06-07 NOTE — Assessment & Plan Note (Signed)
BP Readings from Last 3 Encounters:  06/07/14 161/93  12/16/13 143/82  12/02/13 130/80   Will recheck BP in 1 week on Losartan, as she has been out of medication. Renal function with labs today.

## 2014-06-07 NOTE — Progress Notes (Signed)
Pre visit review using our clinic review tool, if applicable. No additional management support is needed unless otherwise documented below in the visit note. 

## 2014-06-07 NOTE — Patient Instructions (Signed)

## 2014-06-07 NOTE — Progress Notes (Signed)
Subjective:    Patient ID: Victoria Foley, female    DOB: February 10, 1951, 64 y.o.   MRN: [id number]  HPI  64YO female presents for annual exam.  HTN - out of BP med. No HA, CP.   Notes itchy area of skin posterior back over last few weeks. Made worse by wearing panty hose. Not using anything for this.  Feeling well. No other concerns today.  Past medical, surgical, family and social history per today's encounter.  Review of Systems  Constitutional: Negative for fever, chills, appetite change, fatigue and unexpected weight change.  Eyes: Negative for visual disturbance.  Respiratory: Negative for shortness of breath.   Cardiovascular: Negative for chest pain and leg swelling.  Gastrointestinal: Negative for nausea, vomiting, abdominal pain, diarrhea and constipation.  Musculoskeletal: Negative for myalgias and arthralgias.  Skin: Negative for color change and rash.  Hematological: Negative for adenopathy. Does not bruise/bleed easily.  Psychiatric/Behavioral: Negative for sleep disturbance and dysphoric mood. The patient is not nervous/anxious.        Objective:    BP 136/78 mmHg  Pulse 110  Temp(Src) 98 F (36.7 C) (Oral)  Ht 4\' 10"  (1.473 m)  Wt 163 lb (73.936 kg)  BMI 34.08 kg/m2  SpO2 100% Physical Exam  Constitutional: She is oriented to person, place, and time. She appears well-developed and well-nourished. No distress.  HENT:  Head: Normocephalic and atraumatic.  Right Ear: External ear normal.  Left Ear: External ear normal.  Nose: Nose normal.  Mouth/Throat: Oropharynx is clear and moist. No oropharyngeal exudate.  Eyes: Conjunctivae are normal. Pupils are equal, round, and reactive to light. Right eye exhibits no discharge. Left eye exhibits no discharge. No scleral icterus.  Neck: Normal range of motion. Neck supple. No tracheal deviation present. No thyromegaly present.  Cardiovascular: Normal rate, regular rhythm, normal heart sounds and intact distal  pulses.  Exam reveals no gallop and no friction rub.   No murmur heard. Pulmonary/Chest: Effort normal and breath sounds normal. No accessory muscle usage. No tachypnea. No respiratory distress. She has no decreased breath sounds. She has no wheezes. She has no rales. She exhibits no tenderness. Right breast exhibits no inverted nipple, no mass, no nipple discharge, no skin change and no tenderness. Left breast exhibits no inverted nipple, no mass, no nipple discharge, no skin change and no tenderness. Breasts are symmetrical.  Abdominal: Soft. Bowel sounds are normal. She exhibits no distension and no mass. There is no tenderness. There is no rebound and no guarding.  Musculoskeletal: Normal range of motion. She exhibits no edema or tenderness.  Lymphadenopathy:    She has no cervical adenopathy.  Neurological: She is alert and oriented to person, place, and time. No cranial nerve deficit. She exhibits normal muscle tone. Coordination normal.  Skin: Skin is warm and dry. Rash noted. Rash is maculopapular. She is not diaphoretic. No erythema. No pallor.     Psychiatric: She has a normal mood and affect. Her behavior is normal. Judgment and thought content normal.          Assessment & Plan:   Problem List Items Addressed This Visit      Unprioritized   Hypertension    BP Readings from Last 3 Encounters:  06/07/14 161/93  12/16/13 143/82  12/02/13 130/80   Will recheck BP in 1 week on Losartan, as she has been out of medication. Renal function with labs today.      Relevant Medications   losartan (COZAAR) tablet  Routine general medical examination at a health care facility - Primary    General medical exam normal today including breast exam. PAP and pelvic deferred as PAP normal, HPV neg in 2015. Colonoscopy UTD. Immunizations UTD except for flu vaccine which is declined. Will check labs today including CBC, CMP, lipids, TSH.      Relevant Orders   CBC with  Differential/Platelet   Comprehensive metabolic panel   Lipid panel   Hemoglobin A1c   Microalbumin / creatinine urine ratio   Vit D  25 hydroxy (rtn osteoporosis monitoring)    Other Visit Diagnoses    Dermatitis        Relevant Medications    TRIAMCINOLONE ACETONIDE 0.1% EX CREA        Return in about 6 months (around 12/06/2014) for Recheck.

## 2014-06-13 ENCOUNTER — Encounter: Payer: Self-pay | Admitting: Internal Medicine

## 2014-06-13 NOTE — Telephone Encounter (Signed)
Cancelled appointment

## 2014-06-22 ENCOUNTER — Other Ambulatory Visit (INDEPENDENT_AMBULATORY_CARE_PROVIDER_SITE_OTHER): Payer: BLUE CROSS/BLUE SHIELD

## 2014-06-22 ENCOUNTER — Telehealth: Payer: Self-pay | Admitting: *Deleted

## 2014-06-22 DIAGNOSIS — D72819 Decreased white blood cell count, unspecified: Secondary | ICD-10-CM

## 2014-06-22 LAB — CBC WITH DIFFERENTIAL/PLATELET
BASOS PCT: 0.6 % (ref 0.0–3.0)
Basophils Absolute: 0 10*3/uL (ref 0.0–0.1)
EOS PCT: 1.7 % (ref 0.0–5.0)
Eosinophils Absolute: 0.1 10*3/uL (ref 0.0–0.7)
HCT: 38.6 % (ref 36.0–46.0)
Hemoglobin: 13.2 g/dL (ref 12.0–15.0)
Lymphs Abs: 1.8 10*3/uL (ref 0.7–4.0)
MCHC: 34.3 g/dL (ref 30.0–36.0)
MCV: 91.4 fl (ref 78.0–100.0)
Monocytes Absolute: 0.2 10*3/uL (ref 0.1–1.0)
Monocytes Relative: 7.3 % (ref 3.0–12.0)
Neutro Abs: 1 10*3/uL — ABNORMAL LOW (ref 1.4–7.7)
Neutrophils Relative %: 32.7 % — ABNORMAL LOW (ref 43.0–77.0)
Platelets: 232 10*3/uL (ref 150.0–400.0)
RBC: 4.22 Mil/uL (ref 3.87–5.11)
RDW: 13.3 % (ref 11.5–15.5)
WBC: 3 10*3/uL — AB (ref 4.0–10.5)

## 2014-06-22 NOTE — Telephone Encounter (Signed)
Labs and dx?  

## 2014-06-22 NOTE — Telephone Encounter (Signed)
Repeat CBC for leukopenia

## 2014-09-21 ENCOUNTER — Encounter: Payer: Self-pay | Admitting: Nurse Practitioner

## 2014-09-21 ENCOUNTER — Ambulatory Visit (INDEPENDENT_AMBULATORY_CARE_PROVIDER_SITE_OTHER): Payer: BLUE CROSS/BLUE SHIELD | Admitting: Nurse Practitioner

## 2014-09-21 VITALS — BP 118/78 | HR 91 | Temp 97.4°F | Resp 12 | Ht <= 58 in | Wt 161.1 lb

## 2014-09-21 DIAGNOSIS — R399 Unspecified symptoms and signs involving the genitourinary system: Secondary | ICD-10-CM

## 2014-09-21 LAB — POCT URINALYSIS DIPSTICK
Bilirubin, UA: NEGATIVE
Glucose, UA: NEGATIVE
Ketones, UA: NEGATIVE
Leukocytes, UA: NEGATIVE
NITRITE UA: NEGATIVE
Protein, UA: NEGATIVE
Spec Grav, UA: <=1.005
UROBILINOGEN UA: 0.2
pH, UA: 5.5

## 2014-09-21 MED ORDER — SULFAMETHOXAZOLE-TRIMETHOPRIM 800-160 MG PO TABS: 1.0000 | ORAL_TABLET | Freq: Two times a day (BID) | ORAL | Status: DC

## 2014-09-21 NOTE — Assessment & Plan Note (Signed)
POCT probable for UTI. Will treat with Bactrim DS twice daily for 3 days. Encouraged yogurt/probiotics. FU prn worsening/failure to improve.

## 2014-09-21 NOTE — Progress Notes (Signed)
   Subjective:    Patient ID: Victoria Foley, female    DOB: 10-03-1950, 64 y.o.   MRN: [id number]  HPI  Victoria Foley is a 64 yo female with a CC of dysuria x 7 days.   1) Frequency and dysuria. Drinking water and mixed in some vinegar. Denies other symptoms.  Review of Systems  Constitutional: Negative for fever, chills, diaphoresis and fatigue.  Respiratory: Negative for chest tightness, shortness of breath and wheezing.   Cardiovascular: Negative for chest pain, palpitations and leg swelling.  Gastrointestinal: Negative for nausea, vomiting and diarrhea.  Genitourinary: Positive for frequency. Negative for hematuria, flank pain and difficulty urinating.  Skin: Negative for rash.      Objective:   Physical Exam  Constitutional: She is oriented to person, place, and time. She appears well-developed and well-nourished. No distress.  BP 118/78 mmHg  Pulse 91  Temp(Src) 97.4 F (36.3 C) (Oral)  Resp 12  Ht '4\' 10"'$  (1.473 m)  Wt 161 lb 1.9 oz (73.084 kg)  BMI 33.68 kg/m2  SpO2 95%   HENT:  Head: Normocephalic and atraumatic.  Right Ear: External ear normal.  Left Ear: External ear normal.  Cardiovascular: Normal rate, regular rhythm and normal heart sounds.  Exam reveals no gallop and no friction rub.   No murmur heard. Pulmonary/Chest: Effort normal and breath sounds normal. No respiratory distress. She has no wheezes. She has no rales. She exhibits no tenderness.  Abdominal: There is no CVA tenderness.  Neurological: She is alert and oriented to person, place, and time. No cranial nerve deficit. She exhibits normal muscle tone. Coordination normal.  Skin: Skin is warm and dry. No rash noted. She is not diaphoretic.  Psychiatric: She has a normal mood and affect. Her behavior is normal. Judgment and thought content normal.      Assessment & Plan:

## 2014-09-21 NOTE — Progress Notes (Signed)
Pre visit review using our clinic review tool, if applicable. No additional management support is needed unless otherwise documented below in the visit note. 

## 2014-09-21 NOTE — Patient Instructions (Addendum)
Take the Bactrim DS 1 tablet twice daily for 3 days.   Take with yogurt or probiotics ( Align, [place] or [employer])  Lots of water!

## 2014-09-24 LAB — URINE CULTURE

## 2014-12-07 ENCOUNTER — Ambulatory Visit: Payer: Self-pay | Admitting: Internal Medicine

## 2015-08-30 ENCOUNTER — Other Ambulatory Visit: Payer: Self-pay | Admitting: Internal Medicine

## 2015-08-31 NOTE — Telephone Encounter (Signed)
Last OV with you was 2/216/16, Please advise refill. thanks

## 2015-08-31 NOTE — Telephone Encounter (Signed)
Needs a follow up visit, however we can refill x1

## 2015-10-04 ENCOUNTER — Encounter: Payer: Self-pay | Admitting: Internal Medicine

## 2015-10-04 ENCOUNTER — Ambulatory Visit (INDEPENDENT_AMBULATORY_CARE_PROVIDER_SITE_OTHER): Payer: BLUE CROSS/BLUE SHIELD | Admitting: Internal Medicine

## 2015-10-04 VITALS — BP 132/88 | HR 105 | Ht <= 58 in | Wt 167.0 lb

## 2015-10-04 DIAGNOSIS — Z0000 Encounter for general adult medical examination without abnormal findings: Secondary | ICD-10-CM

## 2015-10-04 LAB — COMPREHENSIVE METABOLIC PANEL
ALK PHOS: 85 U/L (ref 39–117)
ALT: 19 U/L (ref 0–35)
AST: 23 U/L (ref 0–37)
Albumin: 4.4 g/dL (ref 3.5–5.2)
BILIRUBIN TOTAL: 0.6 mg/dL (ref 0.2–1.2)
BUN: 16 mg/dL (ref 6–23)
CO2: 25 meq/L (ref 19–32)
Calcium: 9.5 mg/dL (ref 8.4–10.5)
Chloride: 107 mEq/L (ref 96–112)
Creatinine, Ser: 0.78 mg/dL (ref 0.40–1.20)
GFR: 95.65 mL/min (ref >60.00)
GLUCOSE: 96 mg/dL (ref 70–99)
Potassium: 4 mEq/L (ref 3.5–5.1)
SODIUM: 139 meq/L (ref 135–145)
TOTAL PROTEIN: 7.5 g/dL (ref 6.0–8.3)

## 2015-10-04 LAB — HEMOGLOBIN A1C: HEMOGLOBIN A1C: 6.1 % (ref 4.6–6.5)

## 2015-10-04 LAB — POCT URINALYSIS DIPSTICK
BILIRUBIN UA: NEGATIVE
GLUCOSE UA: NEGATIVE
Ketones, UA: NEGATIVE
NITRITE UA: NEGATIVE
Protein, UA: NEGATIVE
RBC UA: NEGATIVE
Spec Grav, UA: 1.015
Urobilinogen, UA: 0.2
pH, UA: 6

## 2015-10-04 LAB — CBC WITH DIFFERENTIAL/PLATELET
BASOS ABS: 0 10*3/uL (ref 0.0–0.1)
BASOS PCT: 0.4 % (ref 0.0–3.0)
EOS ABS: 0.1 10*3/uL (ref 0.0–0.7)
Eosinophils Relative: 1.6 % (ref 0.0–5.0)
HCT: 39.7 % (ref 36.0–46.0)
HEMOGLOBIN: 13.3 g/dL (ref 12.0–15.0)
LYMPHS PCT: 48.5 % — AB (ref 12.0–46.0)
Lymphs Abs: 1.5 10*3/uL (ref 0.7–4.0)
MCHC: 33.6 g/dL (ref 30.0–36.0)
MCV: 91.8 fl (ref 78.0–100.0)
Monocytes Absolute: 0.3 10*3/uL (ref 0.1–1.0)
Monocytes Relative: 8 % (ref 3.0–12.0)
Neutro Abs: 1.3 10*3/uL — ABNORMAL LOW (ref 1.4–7.7)
Neutrophils Relative %: 41.5 % — ABNORMAL LOW (ref 43.0–77.0)
Platelets: 256 10*3/uL (ref 150.0–400.0)
RBC: 4.32 Mil/uL (ref 3.87–5.11)
RDW: 13.3 % (ref 11.5–15.5)
WBC: 3.2 10*3/uL — AB (ref 4.0–10.5)

## 2015-10-04 LAB — LIPID PANEL
CHOL/HDL RATIO: 3
Cholesterol: 157 mg/dL (ref 0–200)
HDL: 60.4 mg/dL (ref >39.00)
LDL Cholesterol: 86 mg/dL (ref 0–99)
NONHDL: 96.75
Triglycerides: 56 mg/dL (ref 0.0–149.0)
VLDL: 11.2 mg/dL (ref 0.0–40.0)

## 2015-10-04 LAB — MICROALBUMIN / CREATININE URINE RATIO
CREATININE, U: 20.3 mg/dL
MICROALB/CREAT RATIO: 3.5 mg/g (ref 0.0–30.0)
Microalb, Ur: <0.7 mg/dL (ref 0.0–1.9)

## 2015-10-04 MED ORDER — LOSARTAN POTASSIUM 100 MG PO TABS: 100.0000 mg | ORAL_TABLET | Freq: Every day | ORAL | Status: DC

## 2015-10-04 NOTE — Progress Notes (Signed)
Subjective:    Patient ID: Victoria Foley, female    DOB: Mar 21, 1951, 65 y.o.   MRN: [id number]  HPI  65YO female presents for physical exam.  Feeling well. No concerns today.   Wt Readings from Last 3 Encounters:  10/04/15 167 lb (75.751 kg)  09/21/14 161 lb 1.9 oz (73.084 kg)  06/07/14 163 lb (73.936 kg)   BP Readings from Last 3 Encounters:  10/04/15 132/88  09/21/14 118/78  06/07/14 136/78    Past Medical History  Diagnosis Date  . Hypertension    Family History  Problem Relation Age of Onset  . Diabetes Mother   . Hypertension Mother   . Heart disease Mother   . Cancer Mother     breast and stomach  . Diabetes Father   . Hypertension Father   . Heart disease Father   . Kidney disease Father    Past Surgical History  Procedure Laterality Date  . Femur surgery      left, s/p rod for fracture  . Cesarean section    . Tubal ligation     Social History   Social History  . Marital Status: Married    Spouse Name: N/A  . Number of Children: N/A  . Years of Education: N/A   Social History Main Topics  . Smoking status: Never Smoker   . Smokeless tobacco: None  . Alcohol Use: No  . Drug Use: None  . Sexual Activity: Not Asked   Other Topics Concern  . None   Social History Narrative   Lives in [place] with husband. No pets      Work - [employer]   Diet - regular diet   Exercise - none    Review of Systems  Constitutional: Negative for fever, chills, appetite change, fatigue and unexpected weight change.  Eyes: Negative for visual disturbance.  Respiratory: Negative for shortness of breath.   Cardiovascular: Negative for chest pain and leg swelling.  Gastrointestinal: Negative for nausea, vomiting, abdominal pain, diarrhea and constipation.  Musculoskeletal: Negative for myalgias and arthralgias.  Skin: Negative for color change and rash.  Hematological: Negative for adenopathy. Does not bruise/bleed easily.    Psychiatric/Behavioral: Negative for sleep disturbance and dysphoric mood. The patient is not nervous/anxious.        Objective:    BP 132/88 mmHg  Pulse 105  Ht '4\' 10"'$  (1.473 m)  Wt 167 lb (75.751 kg)  BMI 34.91 kg/m2  SpO2 97% Physical Exam  Constitutional: She is oriented to person, place, and time. She appears well-developed and well-nourished. No distress.  HENT:  Head: Normocephalic and atraumatic.  Right Ear: External ear normal.  Left Ear: External ear normal.  Nose: Nose normal.  Mouth/Throat: Oropharynx is clear and moist. No oropharyngeal exudate.  Eyes: Conjunctivae are normal. Pupils are equal, round, and reactive to light. Right eye exhibits no discharge. Left eye exhibits no discharge. No scleral icterus.  Neck: Normal range of motion. Neck supple. No tracheal deviation present. No thyromegaly present.  Cardiovascular: Normal rate, regular rhythm, normal heart sounds and intact distal pulses.  Exam reveals no gallop and no friction rub.   No murmur heard. Pulmonary/Chest: Effort normal and breath sounds normal. No accessory muscle usage. No tachypnea. No respiratory distress. She has no decreased breath sounds. She has no wheezes. She has no rales. She exhibits no tenderness. Right breast exhibits no inverted nipple, no mass, no nipple discharge, no skin change and no tenderness. Left breast exhibits no  inverted nipple, no mass, no nipple discharge, no skin change and no tenderness. Breasts are symmetrical.  Abdominal: Soft. Bowel sounds are normal. She exhibits no distension and no mass. There is no tenderness. There is no rebound and no guarding.  Musculoskeletal: Normal range of motion. She exhibits no edema or tenderness.  Lymphadenopathy:    She has no cervical adenopathy.  Neurological: She is alert and oriented to person, place, and time. No cranial nerve deficit. She exhibits normal muscle tone. Coordination normal.  Skin: Skin is warm and dry. No rash noted.  She is not diaphoretic. No erythema. No pallor.  Psychiatric: She has a normal mood and affect. Her behavior is normal. Judgment and thought content normal.          Assessment & Plan:   Problem List Items Addressed This Visit      Unprioritized   Routine general medical examination at a health care facility - Primary    General medical exam normal today including breast exam. PAP and pelvic deferred given age and normal PAP, HPV neg in 2015. Labs today. Mammogram ordered. Colonoscopy UTD. Declines flu vaccine. Encouraged healthy diet and exercise.      Relevant Orders   POCT Urinalysis Dipstick (Completed)   CBC with Differential/Platelet   Comprehensive metabolic panel   Lipid panel   Hemoglobin A1c   Microalbumin / creatinine urine ratio   MM Digital Screening       Return in about 1 year (around 10/03/2016) for Physical.  Ronette Deter, MD Internal Medicine [facility]

## 2015-10-04 NOTE — Progress Notes (Signed)
Pre visit review using our clinic review tool, if applicable. No additional management support is needed unless otherwise documented below in the visit note. 

## 2015-10-04 NOTE — Assessment & Plan Note (Addendum)
General medical exam normal today including breast exam. PAP and pelvic deferred given age and normal PAP, HPV neg in 2015. Labs today. Mammogram ordered. Colonoscopy UTD. Declines flu vaccine. Encouraged healthy diet and exercise.

## 2015-10-04 NOTE — Patient Instructions (Signed)
Health Maintenance, Female Adopting a healthy lifestyle and getting preventive care can go a long way to promote health and wellness. Talk with your health care provider about what schedule of regular examinations is right for you. This is a good chance for you to check in with your provider about disease prevention and staying healthy. In between checkups, there are plenty of things you can do on your own. Experts have done a lot of research about which lifestyle changes and preventive measures are most likely to keep you healthy. Ask your health care provider for more information. WEIGHT AND DIET  Eat a healthy diet  Be sure to include plenty of vegetables, fruits, low-fat dairy products, and lean protein.  Do not eat a lot of foods high in solid fats, added sugars, or salt.  Get regular exercise. This is one of the most important things you can do for your health.  Most adults should exercise for at least 150 minutes each week. The exercise should increase your heart rate and make you sweat (moderate-intensity exercise).  Most adults should also do strengthening exercises at least twice a week. This is in addition to the moderate-intensity exercise.  Maintain a healthy weight  Body mass index (BMI) is a measurement that can be used to identify possible weight problems. It estimates body fat based on height and weight. Your health care provider can help determine your BMI and help you achieve or maintain a healthy weight.  For females 20 years of age and older:   A BMI below 18.5 is considered underweight.  A BMI of 18.5 to 24.9 is normal.  A BMI of 25 to 29.9 is considered overweight.  A BMI of 30 and above is considered obese.  Watch levels of cholesterol and blood lipids  You should start having your blood tested for lipids and cholesterol at 64 years of age, then have this test every 5 years.  You may need to have your cholesterol levels checked more often if:  Your lipid  or cholesterol levels are high.  You are older than 64 years of age.  You are at high risk for heart disease.  CANCER SCREENING   Lung Cancer  Lung cancer screening is recommended for adults 55-80 years old who are at high risk for lung cancer because of a history of smoking.  A yearly low-dose CT scan of the lungs is recommended for people who:  Currently smoke.  Have quit within the past 15 years.  Have at least a 30-pack-year history of smoking. A pack year is smoking an average of one pack of cigarettes a day for 1 year.  Yearly screening should continue until it has been 15 years since you quit.  Yearly screening should stop if you develop a health problem that would prevent you from having lung cancer treatment.  Breast Cancer  Practice breast self-awareness. This means understanding how your breasts normally appear and feel.  It also means doing regular breast self-exams. Let your health care provider know about any changes, no matter how small.  If you are in your 20s or 30s, you should have a clinical breast exam (CBE) by a health care provider every 1-3 years as part of a regular health exam.  If you are 40 or older, have a CBE every year. Also consider having a breast X-ray (mammogram) every year.  If you have a family history of breast cancer, talk to your health care provider about genetic screening.  If you   are at high risk for breast cancer, talk to your health care provider about having an MRI and a mammogram every year.  Breast cancer gene (BRCA) assessment is recommended for women who have family members with BRCA-related cancers. BRCA-related cancers include:  Breast.  Ovarian.  Tubal.  Peritoneal cancers.  Results of the assessment will determine the need for genetic counseling and BRCA1 and BRCA2 testing. Cervical Cancer Your health care provider may recommend that you be screened regularly for cancer of the pelvic organs (ovaries, uterus, and  vagina). This screening involves a pelvic examination, including checking for microscopic changes to the surface of your cervix (Pap test). You may be encouraged to have this screening done every 3 years, beginning at age 21.  For women ages 30-65, health care providers may recommend pelvic exams and Pap testing every 3 years, or they may recommend the Pap and pelvic exam, combined with testing for human papilloma virus (HPV), every 5 years. Some types of HPV increase your risk of cervical cancer. Testing for HPV may also be done on women of any age with unclear Pap test results.  Other health care providers may not recommend any screening for nonpregnant women who are considered low risk for pelvic cancer and who do not have symptoms. Ask your health care provider if a screening pelvic exam is right for you.  If you have had past treatment for cervical cancer or a condition that could lead to cancer, you need Pap tests and screening for cancer for at least 20 years after your treatment. If Pap tests have been discontinued, your risk factors (such as having a new sexual partner) need to be reassessed to determine if screening should resume. Some women have medical problems that increase the chance of getting cervical cancer. In these cases, your health care provider may recommend more frequent screening and Pap tests. Colorectal Cancer  This type of cancer can be detected and often prevented.  Routine colorectal cancer screening usually begins at 64 years of age and continues through 64 years of age.  Your health care provider may recommend screening at an earlier age if you have risk factors for colon cancer.  Your health care provider may also recommend using home test kits to check for hidden blood in the stool.  A small camera at the end of a tube can be used to examine your colon directly (sigmoidoscopy or colonoscopy). This is done to check for the earliest forms of colorectal  cancer.  Routine screening usually begins at age 50.  Direct examination of the colon should be repeated every 5-10 years through 64 years of age. However, you may need to be screened more often if early forms of precancerous polyps or small growths are found. Skin Cancer  Check your skin from head to toe regularly.  Tell your health care provider about any new moles or changes in moles, especially if there is a change in a mole's shape or color.  Also tell your health care provider if you have a mole that is larger than the size of a pencil eraser.  Always use sunscreen. Apply sunscreen liberally and repeatedly throughout the day.  Protect yourself by wearing long sleeves, pants, a wide-brimmed hat, and sunglasses whenever you are outside. HEART DISEASE, DIABETES, AND HIGH BLOOD PRESSURE   High blood pressure causes heart disease and increases the risk of stroke. High blood pressure is more likely to develop in:  People who have blood pressure in the high end   of the normal range (130-139/85-89 mm Hg).  People who are overweight or obese.  People who are African American.  If you are 38-23 years of age, have your blood pressure checked every 3-5 years. If you are 61 years of age or older, have your blood pressure checked every year. You should have your blood pressure measured twice--once when you are at a hospital or clinic, and once when you are not at a hospital or clinic. Record the average of the two measurements. To check your blood pressure when you are not at a hospital or clinic, you can use:  An automated blood pressure machine at a pharmacy.  A home blood pressure monitor.  If you are between 45 years and 39 years old, ask your health care provider if you should take aspirin to prevent strokes.  Have regular diabetes screenings. This involves taking a blood sample to check your fasting blood sugar level.  If you are at a normal weight and have a low risk for diabetes,  have this test once every three years after 64 years of age.  If you are overweight and have a high risk for diabetes, consider being tested at a younger age or more often. PREVENTING INFECTION  Hepatitis B  If you have a higher risk for hepatitis B, you should be screened for this virus. You are considered at high risk for hepatitis B if:  You were born in a country where hepatitis B is common. Ask your health care provider which countries are considered high risk.  Your parents were born in a high-risk country, and you have not been immunized against hepatitis B (hepatitis B vaccine).  You have HIV or AIDS.  You use needles to inject street drugs.  You live with someone who has hepatitis B.  You have had sex with someone who has hepatitis B.  You get hemodialysis treatment.  You take certain medicines for conditions, including cancer, organ transplantation, and autoimmune conditions. Hepatitis C  Blood testing is recommended for:  Everyone born from [birth year] through 1964.  Anyone with known risk factors for hepatitis C. Sexually transmitted infections (STIs)  You should be screened for sexually transmitted infections (STIs) including gonorrhea and chlamydia if:  You are sexually active and are younger than 64 years of age.  You are older than 64 years of age and your health care provider tells you that you are at risk for this type of infection.  Your sexual activity has changed since you were last screened and you are at an increased risk for chlamydia or gonorrhea. Ask your health care provider if you are at risk.  If you do not have HIV, but are at risk, it may be recommended that you take a prescription medicine daily to prevent HIV infection. This is called pre-exposure prophylaxis (PrEP). You are considered at risk if:  You are sexually active and do not regularly use condoms or know the HIV status of your partner(s).  You take drugs by injection.  You are sexually  active with a partner who has HIV. Talk with your health care provider about whether you are at high risk of being infected with HIV. If you choose to begin PrEP, you should first be tested for HIV. You should then be tested every 3 months for as long as you are taking PrEP.  PREGNANCY   If you are premenopausal and you may become pregnant, ask your health care provider about preconception counseling.  If you may  become pregnant, take 400 to 800 micrograms (mcg) of folic acid every day.  If you want to prevent pregnancy, talk to your health care provider about birth control (contraception). OSTEOPOROSIS AND MENOPAUSE   Osteoporosis is a disease in which the bones lose minerals and strength with aging. This can result in serious bone fractures. Your risk for osteoporosis can be identified using a bone density scan.  If you are 61 years of age or older, or if you are at risk for osteoporosis and fractures, ask your health care provider if you should be screened.  Ask your health care provider whether you should take a calcium or vitamin D supplement to lower your risk for osteoporosis.  Menopause may have certain physical symptoms and risks.  Hormone replacement therapy may reduce some of these symptoms and risks. Talk to your health care provider about whether hormone replacement therapy is right for you.  HOME CARE INSTRUCTIONS   Schedule regular health, dental, and eye exams.  Stay current with your immunizations.   Do not use any tobacco products including cigarettes, chewing tobacco, or electronic cigarettes.  If you are pregnant, do not drink alcohol.  If you are breastfeeding, limit how much and how often you drink alcohol.  Limit alcohol intake to no more than 1 drink per day for nonpregnant women. One drink equals 12 ounces of beer, 5 ounces of wine, or 1 ounces of hard liquor.  Do not use street drugs.  Do not share needles.  Ask your health care provider for help if  you need support or information about quitting drugs.  Tell your health care provider if you often feel depressed.  Tell your health care provider if you have ever been abused or do not feel safe at home.   This information is not intended to replace advice given to you by your health care provider. Make sure you discuss any questions you have with your health care provider.   Document Released: 10/22/2010 Document Revised: 04/29/2014 Document Reviewed: 03/10/2013 Elsevier Interactive Patient Education [employer].

## 2015-10-18 DIAGNOSIS — H2513 Age-related nuclear cataract, bilateral: Secondary | ICD-10-CM | POA: Diagnosis not present

## 2015-12-20 ENCOUNTER — Other Ambulatory Visit: Payer: Self-pay | Admitting: Internal Medicine

## 2015-12-20 ENCOUNTER — Ambulatory Visit
Admission: RE | Admit: 2015-12-20 | Discharge: 2015-12-20 | Disposition: A | Discharge status: Home / Self Care | Payer: BLUE CROSS/BLUE SHIELD | Source: Ambulatory Visit | Attending: Internal Medicine | Admitting: Internal Medicine

## 2015-12-20 DIAGNOSIS — Z1231 Encounter for screening mammogram for malignant neoplasm of breast: Secondary | ICD-10-CM | POA: Insufficient documentation

## 2015-12-20 DIAGNOSIS — Z0000 Encounter for general adult medical examination without abnormal findings: Secondary | ICD-10-CM

## 2016-03-04 ENCOUNTER — Ambulatory Visit (INDEPENDENT_AMBULATORY_CARE_PROVIDER_SITE_OTHER): Payer: BLUE CROSS/BLUE SHIELD | Admitting: Podiatry

## 2016-03-04 ENCOUNTER — Ambulatory Visit (INDEPENDENT_AMBULATORY_CARE_PROVIDER_SITE_OTHER): Payer: BLUE CROSS/BLUE SHIELD

## 2016-03-04 VITALS — BP 133/81 | HR 103 | Temp 97.4°F | Resp 16 | Ht 59.0 in | Wt 168.0 lb

## 2016-03-04 DIAGNOSIS — M79672 Pain in left foot: Secondary | ICD-10-CM

## 2016-03-04 DIAGNOSIS — M7752 Other enthesopathy of left foot: Secondary | ICD-10-CM | POA: Diagnosis not present

## 2016-03-04 NOTE — Progress Notes (Signed)
   Subjective:    Patient ID: Victoria Foley, female    DOB: 05-Sep-1950, 65 y.o.   MRN: [id number]  HPI: She presents today chief complaint of pain comes past several months of the forefoot left. She states that it bothers her with certain shoe gear. She denies any trauma.    Review of Systems  All other systems reviewed and are negative.      Objective:   Physical Exam: Vital signs are stable alert and oriented 3. Pulses are strongly palpable. Neurologic sensorium is intact. Degenerative reflexes are intact. Muscle strength was 5 over 5 dorsiflexion plantar flexors and inverters everters OF the musculature is intact. Orthopedic evaluation demonstrates pain on palpation and range of motion of the second metatarsophalangeal joint of the left foot. Radiographs taken today in the office demonstrate osseously mature individual with no major osseous abnormalities. Slightly elongated second metatarsal possibly associated with capsulitis.       Assessment & Plan:  Capsulitis second metatarsophalangeal joint left foot.  Plan: Discussed etiology pathology conservative surgical therapies injected the second metatarsophalangeal joint periarticular [place] today with Kenalog and local anesthetic left foot and discussed appropriate shoe gear. Follow up with her in 1 month

## 2016-10-04 ENCOUNTER — Encounter: Payer: BLUE CROSS/BLUE SHIELD | Admitting: Internal Medicine

## 2016-10-04 ENCOUNTER — Ambulatory Visit (INDEPENDENT_AMBULATORY_CARE_PROVIDER_SITE_OTHER): Payer: BLUE CROSS/BLUE SHIELD | Admitting: Family Medicine

## 2016-10-04 ENCOUNTER — Other Ambulatory Visit (HOSPITAL_COMMUNITY)
Admission: RE | Admit: 2016-10-04 | Discharge: 2016-10-04 | Disposition: A | Discharge status: Home / Self Care | Payer: BLUE CROSS/BLUE SHIELD | Source: Ambulatory Visit | Attending: Internal Medicine | Admitting: Internal Medicine

## 2016-10-04 VITALS — BP 124/86 | HR 102 | Temp 98.5°F | Ht 59.0 in | Wt 171.0 lb

## 2016-10-04 DIAGNOSIS — Z1159 Encounter for screening for other viral diseases: Secondary | ICD-10-CM

## 2016-10-04 DIAGNOSIS — Z124 Encounter for screening for malignant neoplasm of cervix: Secondary | ICD-10-CM | POA: Diagnosis not present

## 2016-10-04 DIAGNOSIS — Z01419 Encounter for gynecological examination (general) (routine) without abnormal findings: Secondary | ICD-10-CM | POA: Insufficient documentation

## 2016-10-04 DIAGNOSIS — Z0000 Encounter for general adult medical examination without abnormal findings: Secondary | ICD-10-CM

## 2016-10-04 LAB — COMPREHENSIVE METABOLIC PANEL
ALBUMIN: 4.4 g/dL (ref 3.5–5.2)
ALT: 22 U/L (ref 0–35)
AST: 27 U/L (ref 0–37)
Alkaline Phosphatase: 84 U/L (ref 39–117)
BUN: 15 mg/dL (ref 6–23)
CHLORIDE: 105 meq/L (ref 96–112)
CO2: 23 mEq/L (ref 19–32)
CREATININE: 0.81 mg/dL (ref 0.40–1.20)
Calcium: 9.5 mg/dL (ref 8.4–10.5)
GFR: 91.28 mL/min (ref >60.00)
Glucose, Bld: 91 mg/dL (ref 70–99)
Potassium: 3.7 mEq/L (ref 3.5–5.1)
SODIUM: 138 meq/L (ref 135–145)
TOTAL PROTEIN: 7.5 g/dL (ref 6.0–8.3)
Total Bilirubin: 0.8 mg/dL (ref 0.2–1.2)

## 2016-10-04 LAB — LIPID PANEL
CHOLESTEROL: 152 mg/dL (ref 0–200)
HDL: 63.1 mg/dL (ref >39.00)
LDL CALC: 77 mg/dL (ref 0–99)
NONHDL: 88.9
Total CHOL/HDL Ratio: 2
Triglycerides: 62 mg/dL (ref 0.0–149.0)
VLDL: 12.4 mg/dL (ref 0.0–40.0)

## 2016-10-04 LAB — CBC
HCT: 40.4 % (ref 36.0–46.0)
Hemoglobin: 13.4 g/dL (ref 12.0–15.0)
MCHC: 33.3 g/dL (ref 30.0–36.0)
MCV: 93.6 fl (ref 78.0–100.0)
PLATELETS: 251 10*3/uL (ref 150.0–400.0)
RBC: 4.31 Mil/uL (ref 3.87–5.11)
RDW: 13.5 % (ref 11.5–15.5)
WBC: 2.9 10*3/uL — ABNORMAL LOW (ref 4.0–10.5)

## 2016-10-04 LAB — TSH: TSH: 2.12 u[IU]/mL (ref 0.35–4.50)

## 2016-10-04 LAB — HEMOGLOBIN A1C: HEMOGLOBIN A1C: 6.3 % (ref 4.6–6.5)

## 2016-10-04 NOTE — Assessment & Plan Note (Signed)
Pap performed today.  Labs today - see orders. Declines immunizations. Advised healthy diet and weight loss.

## 2016-10-04 NOTE — Patient Instructions (Signed)

## 2016-10-04 NOTE — Progress Notes (Signed)
Subjective:  Patient ID: Victoria Foley, female    DOB: 06/18/1950  Age: 66 y.o. MRN: [id number]  CC: Annual physical  HPI Victoria Foley is a 66 y.o. female presents to the clinic today for an annual physical exam.  Preventative Healthcare  Pap smear: In need of.  Mammogram: Up to date. Needs later this year.  Colonoscopy: Up to date.  Immunizations  Tetanus - Declines.   Pneumococcal - Not indicated.  Zoster - Declines.  Hepatitis C screening - Okay with screening.   Labs: Labs today.  Alcohol use: No.  Smoking/tobacco use: No.  STD/HIV testing: Declines.   PMH, Surgical Hx, Family Hx, Social History reviewed and updated as below.  Past Medical History:  Diagnosis Date  . Hypertension    Past Surgical History:  Procedure Laterality Date  . CESAREAN SECTION    . FEMUR SURGERY     left, s/p rod for fracture  . TUBAL LIGATION     Family History  Problem Relation Age of Onset  . Diabetes Mother   . Hypertension Mother   . Heart disease Mother   . Cancer Mother        breast and stomach  . Breast cancer Mother 64  . Diabetes Father   . Hypertension Father   . Heart disease Father   . Kidney disease Father    Social History  Substance Use Topics  . Smoking status: Never Smoker  . Smokeless tobacco: Not on file  . Alcohol use No    Review of Systems  HENT: Positive for tinnitus.   Musculoskeletal:       Occasional knee pain.  All other systems reviewed and are negative.  Objective:   Today's Vitals: BP 124/86 (BP Location: Left Arm, Patient Position: Sitting, Cuff Size: Normal)   Pulse (!) 102   Temp 98.5 F (36.9 C) (Oral)   Ht 4\' 11"  (1.499 m)   Wt 171 lb (77.6 kg)   SpO2 98%   BMI 34.54 kg/m   Physical Exam  Constitutional: She is oriented to person, place, and time. She appears well-developed and well-nourished. No distress.  HENT:  Head: Normocephalic and atraumatic.  Nose: Nose normal.  Mouth/Throat: Oropharynx is clear  and moist. No oropharyngeal exudate.  Normal TM's bilaterally.   Eyes: Conjunctivae are normal. No scleral icterus.  Neck: Neck supple.  Cardiovascular: Normal rate and regular rhythm.   No murmur heard. Pulmonary/Chest: Effort normal and breath sounds normal. She has no wheezes. She has no rales.  Abdominal: Soft. She exhibits no distension. There is no tenderness. There is no rebound and no guarding.  Genitourinary:  Genitourinary Comments: Pelvic Exam: External: normal female genitalia without lesions or masses Vagina: normal without lesions or masses Cervix: normal without lesions or masses. Pap smear: performed   Musculoskeletal: Normal range of motion. She exhibits no edema.  Lymphadenopathy:    She has no cervical adenopathy.  Neurological: She is alert and oriented to person, place, and time.  Skin: Skin is warm and dry. No rash noted.  Psychiatric: She has a normal mood and affect.  Vitals reviewed.  Assessment & Plan:   Problem List Items Addressed This Visit    Annual physical exam - Primary    Pap performed today.  Labs today - see orders. Declines immunizations. Advised healthy diet and weight loss.       Relevant Orders   CBC   Hemoglobin A1c   Comprehensive metabolic panel   Lipid panel  TSH    Other Visit Diagnoses    Need for hepatitis C screening test       Relevant Orders   Hepatitis C Antibody   Cervical cancer screening       Relevant Orders   Cytology - PAP     Follow-up: Return in about 1 year (around 10/04/2017).  [place]

## 2016-10-05 LAB — HEPATITIS C ANTIBODY: HCV AB: NEGATIVE

## 2016-10-08 LAB — CYTOLOGY - PAP: DIAGNOSIS: NEGATIVE

## 2016-12-02 ENCOUNTER — Other Ambulatory Visit: Payer: Self-pay | Admitting: Family Medicine

## 2016-12-02 ENCOUNTER — Encounter: Payer: Self-pay | Admitting: Family Medicine

## 2016-12-02 ENCOUNTER — Other Ambulatory Visit: Payer: Self-pay | Admitting: Internal Medicine

## 2016-12-02 DIAGNOSIS — L309 Dermatitis, unspecified: Secondary | ICD-10-CM

## 2016-12-02 MED ORDER — TRIAMCINOLONE ACETONIDE 0.1 % EX CREA: 1.0000 "application " | TOPICAL_CREAM | Freq: Two times a day (BID) | CUTANEOUS | 0 refills | Status: DC

## 2016-12-02 MED ORDER — FLUTICASONE PROPIONATE 50 MCG/ACT NA SUSP: NASAL | 1 refills | Status: DC

## 2016-12-02 NOTE — Telephone Encounter (Signed)
Please advise for refill, thanks, last filled in 2016 by Dr. Gilford Rile

## 2016-12-03 MED ORDER — LOSARTAN POTASSIUM 100 MG PO TABS: 100.0000 mg | ORAL_TABLET | Freq: Every day | ORAL | 3 refills | Status: DC

## 2016-12-04 DIAGNOSIS — M1712 Unilateral primary osteoarthritis, left knee: Secondary | ICD-10-CM | POA: Diagnosis not present

## 2016-12-04 DIAGNOSIS — M25562 Pain in left knee: Secondary | ICD-10-CM | POA: Diagnosis not present

## 2017-02-10 ENCOUNTER — Ambulatory Visit (INDEPENDENT_AMBULATORY_CARE_PROVIDER_SITE_OTHER): Payer: BLUE CROSS/BLUE SHIELD | Admitting: Podiatry

## 2017-02-10 ENCOUNTER — Encounter: Payer: Self-pay | Admitting: Podiatry

## 2017-02-10 DIAGNOSIS — M7752 Other enthesopathy of left foot: Secondary | ICD-10-CM | POA: Diagnosis not present

## 2017-02-12 NOTE — Progress Notes (Signed)
She presents today for follow-up of her capsulitis of the second metatarsophalangeal joint. She states that this foot has done okay until about August minutes started to hurt more. Her last visit was 03/04/2016.  Objective: Vital signs are stable she is alert and oriented 3. Pulses are palpable. She has mild edema overlying the second metatarsophalangeal joint of the left foot but very little in the way of digital deformity. She does have pain on in range of motion of the second metatarsophalangeal joint. It is mildly tender on palpation dorsally plantarly and dorsolateral aspect of the joint in particular.  Assessment: Capsulitis of the second metatarsophalangeal joint of the left foot.  Plan: Discussed etiology pathology conservative versus surgical therapies. At this point we performed a periarticular injection with Kenalog and local anesthetic to help alleviate her symptoms. We discussed appropriate shoe gear stretching exercises ice therapy and shoe modifications. Instructing her to make sure that she wear stiff soled shoes even in the house and not to go barefoot wear flip flops or sandals. I will follow-up with her on an as-needed basis.

## 2017-10-02 ENCOUNTER — Other Ambulatory Visit: Payer: Self-pay | Admitting: Family Medicine

## 2017-10-07 ENCOUNTER — Encounter: Payer: BLUE CROSS/BLUE SHIELD | Admitting: Family Medicine

## 2017-10-10 ENCOUNTER — Ambulatory Visit: Payer: BLUE CROSS/BLUE SHIELD | Admitting: Internal Medicine

## 2017-10-10 ENCOUNTER — Encounter: Payer: Self-pay | Admitting: Internal Medicine

## 2017-10-10 VITALS — BP 142/94 | HR 77 | Temp 98.1°F | Ht 59.0 in | Wt 154.2 lb

## 2017-10-10 DIAGNOSIS — L309 Dermatitis, unspecified: Secondary | ICD-10-CM | POA: Diagnosis not present

## 2017-10-10 DIAGNOSIS — I10 Essential (primary) hypertension: Secondary | ICD-10-CM | POA: Diagnosis not present

## 2017-10-10 DIAGNOSIS — Z1159 Encounter for screening for other viral diseases: Secondary | ICD-10-CM | POA: Diagnosis not present

## 2017-10-10 DIAGNOSIS — E559 Vitamin D deficiency, unspecified: Secondary | ICD-10-CM | POA: Diagnosis not present

## 2017-10-10 DIAGNOSIS — Z1231 Encounter for screening mammogram for malignant neoplasm of breast: Secondary | ICD-10-CM

## 2017-10-10 DIAGNOSIS — Z1389 Encounter for screening for other disorder: Secondary | ICD-10-CM | POA: Diagnosis not present

## 2017-10-10 DIAGNOSIS — Z0000 Encounter for general adult medical examination without abnormal findings: Secondary | ICD-10-CM | POA: Diagnosis not present

## 2017-10-10 DIAGNOSIS — Z0184 Encounter for antibody response examination: Secondary | ICD-10-CM

## 2017-10-10 DIAGNOSIS — D72819 Decreased white blood cell count, unspecified: Secondary | ICD-10-CM

## 2017-10-10 DIAGNOSIS — R7303 Prediabetes: Secondary | ICD-10-CM | POA: Insufficient documentation

## 2017-10-10 DIAGNOSIS — Z1329 Encounter for screening for other suspected endocrine disorder: Secondary | ICD-10-CM

## 2017-10-10 MED ORDER — LOSARTAN POTASSIUM 100 MG PO TABS: 100.0000 mg | ORAL_TABLET | Freq: Every day | ORAL | 3 refills | Status: DC

## 2017-10-10 MED ORDER — TRIAMCINOLONE ACETONIDE 0.1 % EX CREA: 1.0000 "application " | TOPICAL_CREAM | Freq: Two times a day (BID) | CUTANEOUS | 2 refills | Status: DC

## 2017-10-10 NOTE — Progress Notes (Signed)
Pre visit review using our clinic review tool, if applicable. No additional management support is needed unless otherwise documented below in the visit note. 

## 2017-10-10 NOTE — Patient Instructions (Addendum)
Please follow up in 3-4 months  Schedule fasting labs no food only water and medications x 12 hours as soon as possible    DASH Eating Plan DASH stands for "Dietary Approaches to Stop Hypertension." The DASH eating plan is a healthy eating plan that has been shown to reduce high blood pressure (hypertension). It may also reduce your risk for type 2 diabetes, heart disease, and stroke. The DASH eating plan may also help with weight loss. What are tips for following this plan? General guidelines  Avoid eating more than 2,300 mg (milligrams) of salt (sodium) a day. If you have hypertension, you may need to reduce your sodium intake to 1,500 mg a day.  Limit alcohol intake to no more than 1 drink a day for nonpregnant women and 2 drinks a day for men. One drink equals 12 oz of beer, 5 oz of wine, or 1 oz of hard liquor.  Work with your health care provider to maintain a healthy body weight or to lose weight. Ask what an ideal weight is for you.  Get at least 30 minutes of exercise that causes your heart to beat faster (aerobic exercise) most days of the week. Activities may include walking, swimming, or biking.  Work with your health care provider or diet and nutrition specialist (dietitian) to adjust your eating plan to your individual calorie needs. Reading food labels  Check food labels for the amount of sodium per serving. Choose foods with less than 5 percent of the Daily Value of sodium. Generally, foods with less than 300 mg of sodium per serving fit into this eating plan.  To find whole grains, look for the word "whole" as the first word in the ingredient list. Shopping  Buy products labeled as "low-sodium" or "no salt added."  Buy fresh foods. Avoid canned foods and premade or frozen meals. Cooking  Avoid adding salt when cooking. Use salt-free seasonings or herbs instead of table salt or sea salt. Check with your health care provider or pharmacist before using salt  substitutes.  Do not fry foods. Cook foods using healthy methods such as baking, boiling, grilling, and broiling instead.  Cook with heart-healthy oils, such as olive, canola, soybean, or sunflower oil. Meal planning   Eat a balanced diet that includes: ? 5 or more servings of fruits and vegetables each day. At each meal, try to fill half of your plate with fruits and vegetables. ? Up to 6-8 servings of whole grains each day. ? Less than 6 oz of lean meat, poultry, or fish each day. A 3-oz serving of meat is about the same size as a deck of cards. One egg equals 1 oz. ? 2 servings of low-fat dairy each day. ? A serving of nuts, seeds, or beans 5 times each week. ? Heart-healthy fats. Healthy fats called Omega-3 fatty acids are found in foods such as flaxseeds and coldwater fish, like sardines, salmon, and mackerel.  Limit how much you eat of the following: ? Canned or prepackaged foods. ? Food that is high in trans fat, such as fried foods. ? Food that is high in saturated fat, such as fatty meat. ? Sweets, desserts, sugary drinks, and other foods with added sugar. ? Full-fat dairy products.  Do not salt foods before eating.  Try to eat at least 2 vegetarian meals each week.  Eat more home-cooked food and less restaurant, buffet, and fast food.  When eating at a restaurant, ask that your food be prepared with  less salt or no salt, if possible. What foods are recommended? The items listed may not be a complete list. Talk with your dietitian about what dietary choices are best for you. Grains Whole-grain or whole-wheat bread. Whole-grain or whole-wheat pasta. Brown rice. [name]. Bulgur. Whole-grain and low-sodium cereals. Pita bread. Low-fat, low-sodium crackers. Whole-wheat flour tortillas. Vegetables Fresh or frozen vegetables (raw, steamed, roasted, or grilled). Low-sodium or reduced-sodium tomato and vegetable juice. Low-sodium or reduced-sodium tomato sauce and tomato  paste. Low-sodium or reduced-sodium canned vegetables. Fruits All fresh, dried, or frozen fruit. Canned fruit in natural juice (without added sugar). Meat and other protein foods Skinless chicken or [place]. Ground chicken or [place]. Pork with fat trimmed off. Fish and seafood. Egg whites. Dried beans, peas, or lentils. Unsalted nuts, nut butters, and seeds. Unsalted canned beans. Lean cuts of beef with fat trimmed off. Low-sodium, lean deli meat. Dairy Low-fat (1%) or fat-free (skim) milk. Fat-free, low-fat, or reduced-fat cheeses. Nonfat, low-sodium ricotta or cottage cheese. Low-fat or nonfat yogurt. Low-fat, low-sodium cheese. Fats and oils Soft margarine without trans fats. Vegetable oil. Low-fat, reduced-fat, or light mayonnaise and salad dressings (reduced-sodium). Canola, safflower, olive, soybean, and sunflower oils. Avocado. Seasoning and other foods Herbs. Spices. Seasoning mixes without salt. Unsalted popcorn and pretzels. Fat-free sweets. What foods are not recommended? The items listed may not be a complete list. Talk with your dietitian about what dietary choices are best for you. Grains Baked goods made with fat, such as croissants, muffins, or some breads. Dry pasta or rice meal packs. Vegetables Creamed or fried vegetables. Vegetables in a cheese sauce. Regular canned vegetables (not low-sodium or reduced-sodium). Regular canned tomato sauce and paste (not low-sodium or reduced-sodium). Regular tomato and vegetable juice (not low-sodium or reduced-sodium). Victoria Foley. Olives. Fruits Canned fruit in a light or heavy syrup. Fried fruit. Fruit in cream or butter sauce. Meat and other protein foods Fatty cuts of meat. Ribs. Fried meat. Victoria Foley. Sausage. Bologna and other processed lunch meats. Salami. Fatback. Hotdogs. Bratwurst. Salted nuts and seeds. Canned beans with added salt. Canned or smoked fish. Whole eggs or egg yolks. Chicken or [place] with skin. Dairy Whole or 2% milk,  cream, and half-and-half. Whole or full-fat cream cheese. Whole-fat or sweetened yogurt. Full-fat cheese. Nondairy creamers. Whipped toppings. Processed cheese and cheese spreads. Fats and oils Butter. Stick margarine. Lard. Shortening. Ghee. Bacon fat. Tropical oils, such as coconut, palm kernel, or palm oil. Seasoning and other foods Salted popcorn and pretzels. Onion salt, garlic salt, seasoned salt, table salt, and sea salt. Worcestershire sauce. Tartar sauce. Barbecue sauce. Teriyaki sauce. Soy sauce, including reduced-sodium. Steak sauce. Canned and packaged gravies. Fish sauce. Oyster sauce. Cocktail sauce. Horseradish that you find on the shelf. Ketchup. Mustard. Meat flavorings and tenderizers. Bouillon cubes. Hot sauce and Tabasco sauce. Premade or packaged marinades. Premade or packaged taco seasonings. Relishes. Regular salad dressings. Where to find more information:  National Heart, Lung, and [place]: https://wilson-eaton.com/  American Heart Association: www.heart.org Summary  The DASH eating plan is a healthy eating plan that has been shown to reduce high blood pressure (hypertension). It may also reduce your risk for type 2 diabetes, heart disease, and stroke.  With the DASH eating plan, you should limit salt (sodium) intake to 2,300 mg a day. If you have hypertension, you may need to reduce your sodium intake to 1,500 mg a day.  When on the DASH eating plan, aim to eat more fresh fruits and vegetables, whole grains, lean proteins,  low-fat dairy, and heart-healthy fats.  Work with your health care provider or diet and nutrition specialist (dietitian) to adjust your eating plan to your individual calorie needs. This information is not intended to replace advice given to you by your health care provider. Make sure you discuss any questions you have with your health care provider. Document Released: 03/28/2011 Document Revised: 04/01/2016 Document Reviewed: 04/01/2016 Elsevier  Interactive Patient Education  2018 [employer].  Hypertension Hypertension is another name for high blood pressure. High blood pressure forces your heart to work harder to pump blood. This can cause problems over time. There are two numbers in a blood pressure reading. There is a top number (systolic) over a bottom number (diastolic). It is best to have a blood pressure below 120/80. Healthy choices can help lower your blood pressure. You may need medicine to help lower your blood pressure if:  Your blood pressure cannot be lowered with healthy choices.  Your blood pressure is higher than 130/80.  Follow these instructions at home: Eating and drinking  If directed, follow the DASH eating plan. This diet includes: ? Filling half of your plate at each meal with fruits and vegetables. ? Filling one quarter of your plate at each meal with whole grains. Whole grains include whole wheat pasta, brown rice, and whole grain bread. ? Eating or drinking low-fat dairy products, such as skim milk or low-fat yogurt. ? Filling one quarter of your plate at each meal with low-fat (lean) proteins. Low-fat proteins include fish, skinless chicken, eggs, beans, and tofu. ? Avoiding fatty meat, cured and processed meat, or chicken with skin. ? Avoiding premade or processed food.  Eat less than 1,500 mg of salt (sodium) a day.  Limit alcohol use to no more than 1 drink a day for nonpregnant women and 2 drinks a day for men. One drink equals 12 oz of beer, 5 oz of wine, or 1 oz of hard liquor. Lifestyle  Work with your doctor to stay at a healthy weight or to lose weight. Ask your doctor what the best weight is for you.  Get at least 30 minutes of exercise that causes your heart to beat faster (aerobic exercise) most days of the week. This may include walking, swimming, or biking.  Get at least 30 minutes of exercise that strengthens your muscles (resistance exercise) at least 3 days a week. This may  include lifting weights or pilates.  Do not use any products that contain nicotine or tobacco. This includes cigarettes and e-cigarettes. If you need help quitting, ask your doctor.  Check your blood pressure at home as told by your doctor.  Keep all follow-up visits as told by your doctor. This is important. Medicines  Take over-the-counter and prescription medicines only as told by your doctor. Follow directions carefully.  Do not skip doses of blood pressure medicine. The medicine does not work as well if you skip doses. Skipping doses also puts you at risk for problems.  Ask your doctor about side effects or reactions to medicines that you should watch for. Contact a doctor if:  You think you are having a reaction to the medicine you are taking.  You have headaches that keep coming back (recurring).  You feel dizzy.  You have swelling in your ankles.  You have trouble with your vision. Get help right away if:  You get a very bad headache.  You start to feel confused.  You feel weak or numb.  You feel faint.  You get very bad pain in your: ? Chest. ? Belly (abdomen).  You throw up (vomit) more than once.  You have trouble breathing. Summary  Hypertension is another name for high blood pressure.  Making healthy choices can help lower blood pressure. If your blood pressure cannot be controlled with healthy choices, you may need to take medicine. This information is not intended to replace advice given to you by your health care provider. Make sure you discuss any questions you have with your health care provider. Document Released: 09/25/2007 Document Revised: 03/06/2016 Document Reviewed: 03/06/2016 Elsevier Interactive Patient Education  2018 [place] white blood cell count/Leukopenia Leukopenia is a condition in which you have a low number of white blood cells. White blood cells help the body to fight infections. The number of white blood cells in  the body varies from person to person. There are five types of white blood cells. Two types (lymphocytes and neutrophils) make up most of the white blood cell count. When lymphocytes are low, the condition is called lymphocytopenia. When neutrophils are low, it is called neutropenia. Neutropenia is the most dangerous type of leukopenia because it can lead to dangerous infections. What are the causes? This condition is commonly caused by damage to soft tissue inside of the bones (bone marrow), which is where most white blood cells are made. Bone marrow can get damaged by:  Medicine or X-ray treatments for cancer (chemotherapy or radiation therapy).  Serious infections.  Cancer of the white blood cells (leukemia, lymphoma, or myeloma).  Medicines, including: ? Certain antibiotics. ? Certain heart medicines. ? Steroids. ? Certain medicines used to treat diseases of the immune system (autoimmune diseases), like rheumatoid arthritis.  Leukopenia also happens when white blood cells are destroyed after leaving the bone marrow, which may result from:  Liver disease.  Autoimmune disease.  Vitamin B deficiencies.  What are the signs or symptoms? One of the most common signs of leukopenia, especially severe neutropenia, is having a lot of bacterial infections. Different infections have different symptoms. An infection in your lungs may cause coughing. A urinary tract infection may cause frequent urination and a burning sensation. You may also get infections of the blood, skin, rectum, throat, sinuses, or ears. Some people have no symptoms. If you do have symptoms, they may include:  Fever.  Fatigue.  Swollen glands (lymph nodes).  Painful mouth ulcers.  Gum disease.  How is this diagnosed? This condition may be diagnosed based on:  Your medical history.  A physical exam to check for swollen lymph nodes and an enlarged spleen. Your spleen is an organ on the left side of your body that  stores white blood cells.  Tests, such as: ? A complete blood count. This blood test counts each type of white cell. ? Bone marrow aspiration. Some bone marrow is removed to be checked under a microscope. ? Lymph node biopsy. Some lymph node tissue is removed to be checked under a microscope. ? Other types of blood tests or imaging tests.  How is this treated? Treatment of leukopenia depends on the cause. Some common treatments include:  Antibiotic medicine to treat bacterial infections.  Stopping medicines that may cause leukopenia.  Medicines to stimulate neutrophil production (hematopoietic growth factors), to treat neutropenia.  Follow these instructions at home:  Take over-the-counter and prescription medicines only as told by your health care provider. This includes supplements and vitamins.  If you were prescribed an antibiotic medicine, take it as told  by your health care provider. Do not stop taking the antibiotic even if you start to feel better.  Preventing infection is important if you have leukopenia. To prevent infection: ? Avoid close contact with sick people. ? Wash your hands frequently with soap and water. If soap and water are not available, use hand sanitizer. ? Do not&nbsp;eat uncooked or undercooked meats. ? Wash fruits and vegetables before eating them. ? Do not eat or drink unpasteurized dairy products. ? Get regular dental care, and maintain good dental hygiene. You should visit the dentist at least once every 6 months.  Keep all follow-up visits as told by your health care provider. This is important. Contact a health care provider if:  You have chills or a fever.  You have symptoms of an infection. Get help right away if:  You have a fever that lasts for more than 2-3 days.  You have symptoms that last for more than 2-3 days.  You have trouble breathing.  You have chest pain. This information is not intended to replace advice given to you by  your health care provider. Make sure you discuss any questions you have with your health care provider. Document Released: 04/13/2013 Document Revised: 02/27/2016 Document Reviewed: 02/27/2016 Elsevier Interactive Patient Education  [employer].

## 2017-10-10 NOTE — Progress Notes (Signed)
Chief Complaint  Patient presents with  . Annual Exam  . Transitions Of Care   TOC/physical no complaints declines vaccines   1. Low WBC since 2013 feeling well  2. HTN sl elevated today takes losartan 100 mg qhs  3. Irritation underarms uses secret deoderant and prn TMC to help with irritation    Review of Systems  Constitutional: Negative for weight loss.  HENT: Negative for hearing loss.   Eyes: Negative for blurred vision.  Respiratory: Negative for shortness of breath.   Cardiovascular: Negative for chest pain.  Gastrointestinal: Negative for abdominal pain and blood in stool.  Musculoskeletal: Negative for falls.  Skin: Positive for rash.  Neurological: Negative for headaches.  Psychiatric/Behavioral: Negative for depression.   Past Medical History:  Diagnosis Date  . Hypertension    Past Surgical History:  Procedure Laterality Date  . CESAREAN SECTION    . FEMUR SURGERY     left, s/p rod for fracture  . TUBAL LIGATION     Family History  Problem Relation Age of Onset  . Diabetes Mother   . Hypertension Mother   . Heart disease Mother   . Cancer Mother        breast and stomach  . Breast cancer Mother 36  . Diabetes Father   . Hypertension Father   . Heart disease Father   . Kidney disease Father    Social History   Socioeconomic History  . Marital status: Married    Spouse name: Not on file  . Number of children: Not on file  . Years of education: Not on file  . Highest education level: Not on file  Occupational History  . Not on file  Social Needs  . Financial resource strain: Not on file  . Food insecurity:    Worry: Not on file    Inability: Not on file  . Transportation needs:    Medical: Not on file    Non-medical: Not on file  Tobacco Use  . Smoking status: Never Smoker  . Smokeless tobacco: Never Used  Substance and Sexual Activity  . Alcohol use: No  . Drug use: Not on file  . Sexual activity: Not on file  Lifestyle  . Physical  activity:    Days per week: Not on file    Minutes per session: Not on file  . Stress: Not on file  Relationships  . Social connections:    Talks on phone: Not on file    Gets together: Not on file    Attends religious service: Not on file    Active member of club or organization: Not on file    Attends meetings of clubs or organizations: Not on file    Relationship status: Not on file  . Intimate partner violence:    Fear of current or ex partner: Not on file    Emotionally abused: Not on file    Physically abused: Not on file    Forced sexual activity: Not on file  Other Topics Concern  . Not on file  Social History Narrative   Lives in [place] with husband. No pets      Work - [employer]   Diet - regular diet   Exercise - none   Current Meds  Medication Sig  . fluticasone (FLONASE) 50 MCG/ACT nasal spray USE TWO SPRAYS IN EACH NOSTRIL ONCE DAILY  . losartan (COZAAR) 100 MG tablet Take 1 tablet (100 mg total) by mouth daily.  [name] triamcinolone cream (  KENALOG) 0.1 % Apply 1 application topically 2 (two) times daily.  . [DISCONTINUED] losartan (COZAAR) 100 MG tablet Take 1 tablet (100 mg total) by mouth daily.  . [DISCONTINUED] triamcinolone cream (KENALOG) 0.1 % Apply 1 application topically 2 (two) times daily.   Allergies  Allergen Reactions  . Diclofenac Hives  . Lisinopril     cough   No results found for this or any previous visit (from the past 2160 hour(s)). Objective  Body mass index is 31.14 kg/m. Wt Readings from Last 3 Encounters:  10/10/17 154 lb 3.2 oz (69.9 kg)  10/04/16 171 lb (77.6 kg)  03/04/16 168 lb (76.2 kg)   Temp Readings from Last 3 Encounters:  10/10/17 98.1 F (36.7 C) (Oral)  10/04/16 98.5 F (36.9 C) (Oral)  03/04/16 97.4 F (36.3 C)   BP Readings from Last 3 Encounters:  10/10/17 (!) 142/94  10/04/16 124/86  03/04/16 133/81   Pulse Readings from Last 3 Encounters:  10/10/17 77  10/04/16 (!) 102  03/04/16 (!) 103     Physical Exam  Constitutional: She is oriented to person, place, and time. She appears well-developed and well-nourished. She is cooperative.  HENT:  Head: Normocephalic and atraumatic.  Mouth/Throat: Oropharynx is clear and moist and mucous membranes are normal.  Eyes: Pupils are equal, round, and reactive to light. Conjunctivae are normal.  Cardiovascular: Normal rate, regular rhythm and normal heart sounds.  Pulmonary/Chest: Effort normal and breath sounds normal. Right breast exhibits no inverted nipple, no mass, no nipple discharge, no skin change and no tenderness. Left breast exhibits no inverted nipple, no mass, no nipple discharge, no skin change and no tenderness. No breast swelling, tenderness, discharge or bleeding. Breasts are symmetrical.  Neurological: She is alert and oriented to person, place, and time. Gait normal.  Skin: Skin is warm, dry and intact.  Psychiatric: She has a normal mood and affect. Her speech is normal and behavior is normal. Judgment and thought content normal. Cognition and memory are normal.  Nursing note and vitals reviewed.   Assessment   1. Annual  2. Leukopenia  3. HTN/prediabets  4. Contact dermatitis  Plan  1.  Declines vaccines will redisc Tdap in future   Out of age window pap had 10/04/16 normal no HPV no h/o abnormal  Colonoscopy had 07/23/13 Dr. Tiffany Kocher hyperplastic polyp repeat in 10 years  Breast exam today nl referred mammo DEXA 07/04/09 normal  Declines STD check, hep B Hep C neg 10/04/16  sch fasting labs  rec healthy diet choices and exercise  UA today  2.  Repeat if abnormal again since 2013 refer H/o further w/u  3. Refilled losartan 100 mg qhs  F/u in 3-4 months is elevated add hctz or norvasc rec buy cuff and log bp  4. Refilled TMC rec try electric razor and change deoderant   Provider: Dr. Olivia Mackie McLean-Scocuzza-[facility]

## 2017-10-11 LAB — MICROSCOPIC EXAMINATION: Casts: NONE SEEN /lpf

## 2017-10-11 LAB — URINALYSIS, ROUTINE W REFLEX MICROSCOPIC
BILIRUBIN UA: NEGATIVE
GLUCOSE, UA: NEGATIVE
KETONES UA: NEGATIVE
NITRITE UA: NEGATIVE
PROTEIN UA: NEGATIVE
RBC UA: NEGATIVE
SPEC GRAV UA: 1.006 (ref 1.005–1.030)
UUROB: 0.2 mg/dL (ref 0.2–1.0)
pH, UA: 5 (ref 5.0–7.5)

## 2017-10-14 ENCOUNTER — Other Ambulatory Visit: Payer: Self-pay | Admitting: Internal Medicine

## 2017-10-14 ENCOUNTER — Other Ambulatory Visit (INDEPENDENT_AMBULATORY_CARE_PROVIDER_SITE_OTHER): Payer: BLUE CROSS/BLUE SHIELD

## 2017-10-14 DIAGNOSIS — Z1329 Encounter for screening for other suspected endocrine disorder: Secondary | ICD-10-CM

## 2017-10-14 DIAGNOSIS — I10 Essential (primary) hypertension: Secondary | ICD-10-CM

## 2017-10-14 DIAGNOSIS — Z0184 Encounter for antibody response examination: Secondary | ICD-10-CM | POA: Diagnosis not present

## 2017-10-14 DIAGNOSIS — E559 Vitamin D deficiency, unspecified: Secondary | ICD-10-CM | POA: Diagnosis not present

## 2017-10-14 DIAGNOSIS — Z1159 Encounter for screening for other viral diseases: Secondary | ICD-10-CM | POA: Diagnosis not present

## 2017-10-14 DIAGNOSIS — D72819 Decreased white blood cell count, unspecified: Secondary | ICD-10-CM

## 2017-10-14 LAB — COMPREHENSIVE METABOLIC PANEL
ALBUMIN: 4.3 g/dL (ref 3.5–5.2)
ALT: 15 U/L (ref 0–35)
AST: 21 U/L (ref 0–37)
Alkaline Phosphatase: 70 U/L (ref 39–117)
BUN: 15 mg/dL (ref 6–23)
CALCIUM: 9.4 mg/dL (ref 8.4–10.5)
CHLORIDE: 105 meq/L (ref 96–112)
CO2: 26 meq/L (ref 19–32)
Creatinine, Ser: 0.75 mg/dL (ref 0.40–1.20)
GFR: 99.45 mL/min (ref >60.00)
Glucose, Bld: 96 mg/dL (ref 70–99)
POTASSIUM: 4 meq/L (ref 3.5–5.1)
SODIUM: 139 meq/L (ref 135–145)
Total Bilirubin: 0.8 mg/dL (ref 0.2–1.2)
Total Protein: 7.4 g/dL (ref 6.0–8.3)

## 2017-10-14 LAB — CBC WITH DIFFERENTIAL/PLATELET
BASOS PCT: 1.2 % (ref 0.0–3.0)
Basophils Absolute: 0 10*3/uL (ref 0.0–0.1)
EOS ABS: 0 10*3/uL (ref 0.0–0.7)
EOS PCT: 1.5 % (ref 0.0–5.0)
HCT: 38.3 % (ref 36.0–46.0)
Hemoglobin: 12.9 g/dL (ref 12.0–15.0)
Lymphocytes Relative: 46.5 % — ABNORMAL HIGH (ref 12.0–46.0)
Lymphs Abs: 1.4 10*3/uL (ref 0.7–4.0)
MCHC: 33.6 g/dL (ref 30.0–36.0)
MCV: 95.1 fl (ref 78.0–100.0)
MONO ABS: 0.2 10*3/uL (ref 0.1–1.0)
Monocytes Relative: 7.2 % (ref 3.0–12.0)
NEUTROS ABS: 1.3 10*3/uL — AB (ref 1.4–7.7)
Neutrophils Relative %: 43.6 % (ref 43.0–77.0)
PLATELETS: 259 10*3/uL (ref 150.0–400.0)
RBC: 4.03 Mil/uL (ref 3.87–5.11)
RDW: 13.3 % (ref 11.5–15.5)
WBC: 3 10*3/uL — AB (ref 4.0–10.5)

## 2017-10-14 LAB — LIPID PANEL
Cholesterol: 149 mg/dL (ref 0–200)
HDL: 60.6 mg/dL (ref >39.00)
LDL Cholesterol: 77 mg/dL (ref 0–99)
NonHDL: 88.85
Total CHOL/HDL Ratio: 2
Triglycerides: 59 mg/dL (ref 0.0–149.0)
VLDL: 11.8 mg/dL (ref 0.0–40.0)

## 2017-10-14 LAB — VITAMIN D 25 HYDROXY (VIT D DEFICIENCY, FRACTURES): VITD: 46.13 ng/mL (ref 30.00–100.00)

## 2017-10-14 LAB — TSH: TSH: 2.22 u[IU]/mL (ref 0.35–4.50)

## 2017-10-14 LAB — HEMOGLOBIN A1C: HEMOGLOBIN A1C: 6 % (ref 4.6–6.5)

## 2017-10-15 LAB — MEASLES/MUMPS/RUBELLA IMMUNITY
Mumps IgG: 67 AU/mL
Rubella: 23.7 index
Rubeola IgG: >300 AU/mL

## 2017-10-16 ENCOUNTER — Telehealth: Payer: Self-pay

## 2017-10-16 NOTE — Telephone Encounter (Signed)
Copied from [place] [id number]. Topic: Referral - Question >> Oct 16, 2017  8:24 AM Synthia Innocent wrote: Reason for CRM: Someone called her to schedule referral oncology appt, she called back to the number that called her from [facility], they state they did not call. Concerned and would like appt scheduled. Please advise

## 2017-10-22 ENCOUNTER — Encounter: Payer: Self-pay | Admitting: Internal Medicine

## 2017-10-22 ENCOUNTER — Inpatient Hospital Stay: Payer: BLUE CROSS/BLUE SHIELD | Attending: Oncology | Admitting: Internal Medicine

## 2017-10-22 DIAGNOSIS — Z800 Family history of malignant neoplasm of digestive organs: Secondary | ICD-10-CM | POA: Diagnosis not present

## 2017-10-22 DIAGNOSIS — D72819 Decreased white blood cell count, unspecified: Secondary | ICD-10-CM | POA: Diagnosis not present

## 2017-10-22 DIAGNOSIS — I10 Essential (primary) hypertension: Secondary | ICD-10-CM

## 2017-10-22 DIAGNOSIS — Z79899 Other long term (current) drug therapy: Secondary | ICD-10-CM

## 2017-10-22 DIAGNOSIS — Z803 Family history of malignant neoplasm of breast: Secondary | ICD-10-CM | POA: Diagnosis not present

## 2017-10-22 DIAGNOSIS — D708 Other neutropenia: Secondary | ICD-10-CM

## 2017-10-22 DIAGNOSIS — D709 Neutropenia, unspecified: Secondary | ICD-10-CM

## 2017-10-22 NOTE — Assessment & Plan Note (Addendum)
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  I had a long discussion with the patient regarding the multiple etiologies of her mild neutropenia-including but not limited to benign [drug exposure; autoimmune; benign ethnic neutropenia] versus malignant causes [clinically less likely-but potentially could include bone marrow malignant disorders].   #Given the fact patient is asymptomatic/and the neutropenia is mild to moderate/intermittent since 2013-I think it is reasonable to hold off any further work-up at this time-   Unless patient gets symptomatic/or her blood counts gets worse.  #The above plan of care was discussed with the patient and her family detail.  She was asked to call us if her counts get worse/symptomatic.  She is agreeable with the plan.  Patient follow-up with me in 6 months with labs.  Thank you Dr.McClean for allowing me to participate in the care of your pleasant patient. Please do not hesitate to contact me with questions or concerns in the interim.  # 45 minutes face-to-face with the patient discussing the above plan of care; more than 50% of time spent on prognosis/ natural history; counseling and coordination.

## 2017-10-22 NOTE — Progress Notes (Signed)
[place] NOTE  Patient Care Team: McLean-Scocuzza, Nino Glow, MD as PCP - General (Internal Medicine)  CHIEF COMPLAINTS/PURPOSE OF CONSULTATION:  Leukopenia.  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     HISTORY OF PRESENTING ILLNESS:  Victoria Foley 67 y.o.  female with no significant past medical history except for well-controlled hypertension has been referred to [place] for further evaluation recommendations for intermittent leukopenia.  Patient denies any infectious signs and symptoms.  No fever no chills.  Denies any skin infections/denies any recent hospitalizations.  Patient does admit to weight loss of 20 pounds which is intentional over 1 to 2 years.  Appetite is good.  No nausea no vomiting.  Review of Systems  Constitutional: Negative for chills, diaphoresis, fever, malaise/fatigue and weight loss.  HENT: Negative for nosebleeds and sore throat.   Eyes: Negative for double vision.  Respiratory: Negative for cough, hemoptysis, sputum production, shortness of breath and wheezing.   Cardiovascular: Negative for chest pain, palpitations, orthopnea and leg swelling.  Gastrointestinal: Negative for abdominal pain, blood in stool, constipation, diarrhea, heartburn, melena, nausea and vomiting.  Genitourinary: Negative for dysuria, frequency and urgency.  Musculoskeletal: Negative for back pain and joint pain.  Skin: Negative.  Negative for itching and rash.  Neurological: Negative for dizziness, tingling, focal weakness, weakness and headaches.  Endo/Heme/Allergies: Does not bruise/bleed easily.  Psychiatric/Behavioral: Negative for depression. The patient is not nervous/anxious and does not have insomnia.      MEDICAL HISTORY:  Past Medical History:  Diagnosis Date  . Hypertension     SURGICAL HISTORY: Past Surgical History:   Procedure Laterality Date  . CESAREAN SECTION    . FEMUR SURGERY     left, s/p rod for fracture  . TUBAL LIGATION      SOCIAL HISTORY: 15 mins away; never smoked; never alcohol; working Environmental consultant at [employer].  Social History   Socioeconomic History  . Marital status: Married    Spouse name: Not on file  . Number of children: Not on file  . Years of education: Not on file  . Highest education level: Not on file  Occupational History  . Not on file  Social Needs  . Financial resource strain: Not on file  . Food insecurity:    Worry: Not on file    Inability: Not on file  . Transportation needs:    Medical: Not on file    Non-medical: Not on file  Tobacco Use  . Smoking status: Never Smoker  . Smokeless tobacco: Never Used  Substance and Sexual Activity  . Alcohol use: No  . Drug use: Not on file  . Sexual activity: Not on file  Lifestyle  . Physical activity:    Days per week: Not on file    Minutes per session: Not on file  . Stress: Not on file  Relationships  . Social connections:    Talks on phone: Not on file    Gets together: Not on file    Attends religious service: Not on file    Active member of club or organization: Not on file    Attends meetings of clubs or organizations: Not on file    Relationship status: Not on file  . Intimate partner violence:    Fear of current or ex partner: Not on file    Emotionally abused: Not on file    Physically abused:  Not on file    Forced sexual activity: Not on file  Other Topics Concern  . Not on file  Social History Narrative   Lives in [place] with husband. No pets   2 daughters and 3 granddaughters      Work - [employer]   Diet - regular diet   Exercise - none    FAMILY HISTORY: Family History  Problem Relation Age of Onset  . Diabetes Mother   . Hypertension Mother   . Heart disease Mother   . Cancer Mother        breast and stomach  . Breast cancer Mother 79  . Diabetes Father   .  Hypertension Father   . Heart disease Father   . Kidney disease Father     ALLERGIES:  is allergic to diclofenac and lisinopril.  MEDICATIONS:  Current Outpatient Medications  Medication Sig Dispense Refill  . fluticasone (FLONASE) 50 MCG/ACT nasal spray USE TWO SPRAYS IN EACH NOSTRIL ONCE DAILY 16 g 1  . losartan (COZAAR) 100 MG tablet Take 1 tablet (100 mg total) by mouth daily. 90 tablet 3  . triamcinolone cream (KENALOG) 0.1 % Apply 1 application topically 2 (two) times daily. 45 g 2   No current facility-administered medications for this visit.       [name]  PHYSICAL EXAMINATION: ECOG PERFORMANCE STATUS: 0 - Asymptomatic  Vitals:   10/22/17 1148  BP: (!) 158/99  Pulse: (!) 120  Resp: 16  Temp: (!) 97.3 F (36.3 C)   Filed Weights   10/22/17 1148  Weight: 151 lb (68.5 kg)    GENERAL: Well-nourished well-developed; Alert, no distress and comfortable.  Accompanied by family.  EYES: no pallor or icterus OROPHARYNX: no thrush or ulceration; NECK: supple; no lymph nodes felt. LYMPH:  no palpable lymphadenopathy in the axillary or inguinal regions LUNGS: Decreased breath sounds auscultation bilaterally. No wheeze or crackles HEART/CVS: regular rate & rhythm and no murmurs; No lower extremity edema ABDOMEN:abdomen soft, non-tender and normal bowel sounds. No hepatomegaly or splenomegaly.  Musculoskeletal:no cyanosis of digits and no clubbing  PSYCH: alert & oriented x 3 with fluent speech NEURO: no focal motor/sensory deficits SKIN:  no rashes or significant lesions  LABORATORY DATA:  I have reviewed the data as listed Lab Results  Component Value Date   WBC 3.0 (L) 10/14/2017   HGB 12.9 10/14/2017   HCT 38.3 10/14/2017   MCV 95.1 10/14/2017   PLT 259.0 10/14/2017   Recent Labs    10/14/17 0955  NA 139  K 4.0  CL 105  CO2 26  GLUCOSE 96  BUN 15  CREATININE 0.75  CALCIUM 9.4  PROT 7.4  ALBUMIN 4.3  AST 21  ALT 15  ALKPHOS 70  BILITOT 0.8     RADIOGRAPHIC STUDIES: I have personally reviewed the radiological images as listed and agreed with the findings in the report. No results found.  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  I had a long discussion with the patient regarding the multiple etiologies of her mild neutropenia-including but not limited to benign [drug exposure; autoimmune; benign ethnic neutropenia] versus malignant causes [clinically less likely-but potentially could include bone marrow malignant disorders].   #Given the fact patient is asymptomatic/and the neutropenia is mild to moderate/intermittent since 2013-I think it is reasonable to hold off any further work-up at this time-   Unless patient gets symptomatic/or her blood counts gets worse.  #The above plan  of care was discussed with the patient and her family detail.  She was asked to call us if her counts get worse/symptomatic.  She is agreeable with the plan.  Patient follow-up with me in 6 months with labs.  Thank you Dr.McClean for allowing me to participate in the care of your pleasant patient. Please do not hesitate to contact me with questions or concerns in the interim.  # 45 minutes face-to-face with the patient discussing the above plan of care; more than 50% of time spent on prognosis/ natural history; counseling and coordination.      All questions were answered. The patient knows to call the clinic with any problems, questions or concerns.       Cammie Sickle, MD 10/23/2017 2:33 PM

## 2017-10-27 ENCOUNTER — Encounter: Payer: BLUE CROSS/BLUE SHIELD | Admitting: Oncology

## 2017-10-31 ENCOUNTER — Ambulatory Visit
Admission: RE | Admit: 2017-10-31 | Discharge: 2017-10-31 | Disposition: A | Discharge status: Home / Self Care | Payer: BLUE CROSS/BLUE SHIELD | Source: Ambulatory Visit | Attending: Internal Medicine | Admitting: Internal Medicine

## 2017-10-31 DIAGNOSIS — Z1231 Encounter for screening mammogram for malignant neoplasm of breast: Secondary | ICD-10-CM | POA: Insufficient documentation

## 2017-11-02 ENCOUNTER — Other Ambulatory Visit: Payer: Self-pay | Admitting: Internal Medicine

## 2017-11-02 DIAGNOSIS — N6310 Unspecified lump in the right breast, unspecified quadrant: Secondary | ICD-10-CM

## 2017-11-03 ENCOUNTER — Encounter: Payer: Self-pay | Admitting: *Deleted

## 2017-11-05 ENCOUNTER — Ambulatory Visit
Admission: RE | Admit: 2017-11-05 | Discharge: 2017-11-05 | Disposition: A | Discharge status: Home / Self Care | Payer: BLUE CROSS/BLUE SHIELD | Source: Ambulatory Visit | Attending: Internal Medicine | Admitting: Internal Medicine

## 2017-11-05 DIAGNOSIS — N6310 Unspecified lump in the right breast, unspecified quadrant: Secondary | ICD-10-CM

## 2017-11-05 DIAGNOSIS — N6313 Unspecified lump in the right breast, lower outer quadrant: Secondary | ICD-10-CM | POA: Diagnosis not present

## 2017-11-05 DIAGNOSIS — R928 Other abnormal and inconclusive findings on diagnostic imaging of breast: Secondary | ICD-10-CM | POA: Diagnosis not present

## 2017-11-07 ENCOUNTER — Other Ambulatory Visit: Payer: Self-pay | Admitting: Internal Medicine

## 2017-11-07 DIAGNOSIS — N6001 Solitary cyst of right breast: Secondary | ICD-10-CM

## 2018-01-13 ENCOUNTER — Ambulatory Visit: Payer: BLUE CROSS/BLUE SHIELD | Admitting: Internal Medicine

## 2018-01-13 ENCOUNTER — Encounter: Payer: Self-pay | Admitting: Internal Medicine

## 2018-01-13 VITALS — BP 138/88 | HR 80 | Temp 97.6°F | Ht 59.0 in | Wt 153.4 lb

## 2018-01-13 DIAGNOSIS — M79672 Pain in left foot: Secondary | ICD-10-CM

## 2018-01-13 DIAGNOSIS — I10 Essential (primary) hypertension: Secondary | ICD-10-CM

## 2018-01-13 DIAGNOSIS — E162 Hypoglycemia, unspecified: Secondary | ICD-10-CM

## 2018-01-13 DIAGNOSIS — D72819 Decreased white blood cell count, unspecified: Secondary | ICD-10-CM

## 2018-01-13 DIAGNOSIS — R7303 Prediabetes: Secondary | ICD-10-CM

## 2018-01-13 NOTE — Patient Instructions (Addendum)
Please have repeat mammogram 04/2018 order is in for [place]  Please follow up with hematology/oncolonopy blood doctor 04/2018  Referred to Dr. Milinda Pointer   F/u in 6 month s   Foot Pain Many things can cause foot pain. Some common causes are:  An injury.  A sprain.  Arthritis.  Blisters.  Bunions.  Follow these instructions at home: Pay attention to any changes in your symptoms. Take these actions to help with your discomfort:  If directed, put ice on the affected area: ? Put ice in a plastic bag. ? Place a towel between your skin and the bag. ? Leave the ice on for 15-20 minutes, 3?4 times a day for 2 days.  Take over-the-counter and prescription medicines only as told by your health care provider.  Wear comfortable, supportive shoes that fit you well. Do not wear high heels.  Do not stand or walk for long periods of time.  Do not lift a lot of weight. This can put added pressure on your feet.  Do stretches to relieve foot pain and stiffness as told by your health care provider.  Rub your foot gently.  Keep your feet clean and dry.  Contact a health care provider if:  Your pain does not get better after a few days of self-care.  Your pain gets worse.  You cannot stand on your foot. Get help right away if:  Your foot is numb or tingling.  Your foot or toes are swollen.  Your foot or toes turn white or blue.  You have warmth and redness along your foot. This information is not intended to replace advice given to you by your health care provider. Make sure you discuss any questions you have with your health care provider. Document Released: 05/05/2015 Document Revised: 09/14/2015 Document Reviewed: 05/04/2014 Elsevier Interactive Patient Education  [employer].

## 2018-01-13 NOTE — Progress Notes (Signed)
Chief Complaint  Patient presents with  . Follow-up   3 month f/u  1. HTN controlled on losartan 100 mg qd logging at home and 130s/70s 2. C/o left foot pain ball of foot intermittently 0/10 today walking or sitting in the car makes sore prev saw Dr. Milinda Pointer podiatry 01/2017 with kenalog injection will refer back today  3. Palpitations today fasting x 2 days only protein shake trying to lose will check cbg to make sure not hypoglycemic. cbg 68  Repeat HR 80 manual   Review of Systems  Constitutional: Positive for weight loss.       Trying to lose  HENT: Negative for hearing loss.   Eyes: Negative for blurred vision.  Respiratory: Negative for shortness of breath.   Cardiovascular: Negative for chest pain.  Musculoskeletal: Positive for joint pain.  Skin: Negative for rash.  Neurological: Negative for headaches.  Psychiatric/Behavioral: Negative for depression.   Past Medical History:  Diagnosis Date  . Hypertension    Past Surgical History:  Procedure Laterality Date  . CESAREAN SECTION    . FEMUR SURGERY     left, s/p rod for fracture  . TUBAL LIGATION     Family History  Problem Relation Age of Onset  . Diabetes Mother   . Hypertension Mother   . Heart disease Mother   . Cancer Mother        breast and stomach  . Breast cancer Mother 31  . Diabetes Father   . Hypertension Father   . Heart disease Father   . Kidney disease Father    Social History   Socioeconomic History  . Marital status: Married    Spouse name: Not on file  . Number of children: Not on file  . Years of education: Not on file  . Highest education level: Not on file  Occupational History  . Not on file  Social Needs  . Financial resource strain: Not on file  . Food insecurity:    Worry: Not on file    Inability: Not on file  . Transportation needs:    Medical: Not on file    Non-medical: Not on file  Tobacco Use  . Smoking status: Never Smoker  . Smokeless tobacco: Never Used    Substance and Sexual Activity  . Alcohol use: No  . Drug use: Not on file  . Sexual activity: Not on file  Lifestyle  . Physical activity:    Days per week: Not on file    Minutes per session: Not on file  . Stress: Not on file  Relationships  . Social connections:    Talks on phone: Not on file    Gets together: Not on file    Attends religious service: Not on file    Active member of club or organization: Not on file    Attends meetings of clubs or organizations: Not on file    Relationship status: Not on file  . Intimate partner violence:    Fear of current or ex partner: Not on file    Emotionally abused: Not on file    Physically abused: Not on file    Forced sexual activity: Not on file  Other Topics Concern  . Not on file  Social History Narrative   Lives in [place] with husband. No pets   2 daughters and 3 granddaughters      Work - [employer]   Diet - regular diet   Exercise - none   Current  Meds  Medication Sig  . fluticasone (FLONASE) 50 MCG/ACT nasal spray USE TWO SPRAYS IN EACH NOSTRIL ONCE DAILY  . losartan (COZAAR) 100 MG tablet Take 1 tablet (100 mg total) by mouth daily.  [name] triamcinolone cream (KENALOG) 0.1 % Apply 1 application topically 2 (two) times daily.   Allergies  Allergen Reactions  . Diclofenac Hives  . Lisinopril     cough   No results found for this or any previous visit (from the past 2160 hour(s)). Objective  Body mass index is 30.98 kg/m. Wt Readings from Last 3 Encounters:  01/13/18 153 lb 6.4 oz (69.6 kg)  10/22/17 151 lb (68.5 kg)  10/10/17 154 lb 3.2 oz (69.9 kg)   Temp Readings from Last 3 Encounters:  01/13/18 97.6 F (36.4 C) (Oral)  10/22/17 (!) 97.3 F (36.3 C) (Tympanic)  10/10/17 98.1 F (36.7 C) (Oral)   BP Readings from Last 3 Encounters:  01/13/18 138/88  10/22/17 (!) 158/99  10/10/17 (!) 142/94   Pulse Readings from Last 3 Encounters:  01/13/18 (!) 108  10/22/17 (!) 120  10/10/17 77     Physical Exam  Constitutional: She is oriented to person, place, and time. Vital signs are normal. She appears well-developed and well-nourished. She is cooperative.  HENT:  Head: Normocephalic and atraumatic.  Mouth/Throat: Oropharynx is clear and moist and mucous membranes are normal.  Eyes: Pupils are equal, round, and reactive to light. Conjunctivae are normal.  Cardiovascular: Normal rate, regular rhythm and normal heart sounds.  Pulmonary/Chest: Effort normal and breath sounds normal.  Musculoskeletal:       Feet:  Neurological: She is alert and oriented to person, place, and time. Gait normal.  Skin: Skin is warm, dry and intact.  Psychiatric: She has a normal mood and affect. Her speech is normal and behavior is normal. Judgment and thought content normal. Cognition and memory are normal.  Nursing note and vitals reviewed.   Assessment   1. HTN controlled  2. Left foot pain  3. Palpitations normalized likely related to low cbg 68 today repeat HR 80 manually  4. Prediabetes  5. HM Plan   1. Cont meds  2. Referred podiatry  3. Encouraged to snack with fasting  4. Exercise to lose healthy diet choices  5.  Declines vaccines will redisc Tdap in future   Out of age window pap had 10/04/16 normal no HPV no h/o abnormal  Colonoscopy had 07/23/13 Dr. Tiffany Kocher hyperplastic polyp repeat in 10 years  mammo 10/2017 oil cyst right breast repeat dx mammo in 6 months DEXA 07/04/09 normal  Declines STD check, hep B Hep C neg 10/04/16  sch fasting labs  rec healthy diet choices and exercise   Provider: Dr. Olivia Mackie McLean-Scocuzza-Internal Medicine

## 2018-01-13 NOTE — Progress Notes (Signed)
Pre visit review using our clinic review tool, if applicable. No additional management support is needed unless otherwise documented below in the visit note. 

## 2018-02-02 ENCOUNTER — Encounter: Payer: Self-pay | Admitting: Podiatry

## 2018-02-02 ENCOUNTER — Encounter

## 2018-02-02 ENCOUNTER — Ambulatory Visit (INDEPENDENT_AMBULATORY_CARE_PROVIDER_SITE_OTHER): Payer: BLUE CROSS/BLUE SHIELD | Admitting: Podiatry

## 2018-02-02 DIAGNOSIS — M7752 Other enthesopathy of left foot: Secondary | ICD-10-CM

## 2018-02-02 NOTE — Progress Notes (Signed)
She presents today chief complaint of pain to the second metatarsal phalangeal joint of the left foot.  States that is been bothering me since August.  She states that it only starts up after a long car trip.  Objective: Vital signs are stable she is alert and oriented x3 she has pain on palpation and range of motion of the second metatarsophalangeal joint of the left foot pulses remain palpable.  Mild medial deviation of the second toe with a diastases between the second and third digits no dorsal dislocations.  Assessment: Capsulitis second metatarsophalangeal joint left foot.  Plan: Discussed etiology pathology conservative surgical therapies at this point time reinjected 10 mg of Kenalog around the second metatarsal phalangeal joint most of it is lateral.  This has local anesthetic associated with it.  And was performed after sterile Betadine skin prep.  Follow-up with her on an as-needed basis.

## 2018-02-04 ENCOUNTER — Ambulatory Visit: Payer: BLUE CROSS/BLUE SHIELD | Admitting: Podiatry

## 2018-03-09 ENCOUNTER — Encounter: Payer: Self-pay | Admitting: Internal Medicine

## 2018-03-10 ENCOUNTER — Encounter: Payer: Self-pay | Admitting: Family Medicine

## 2018-03-10 ENCOUNTER — Ambulatory Visit: Payer: BLUE CROSS/BLUE SHIELD | Admitting: Family Medicine

## 2018-03-10 VITALS — BP 130/88 | HR 97 | Temp 97.6°F | Ht 59.0 in | Wt 154.0 lb

## 2018-03-10 DIAGNOSIS — R300 Dysuria: Secondary | ICD-10-CM | POA: Diagnosis not present

## 2018-03-10 DIAGNOSIS — N390 Urinary tract infection, site not specified: Secondary | ICD-10-CM

## 2018-03-10 DIAGNOSIS — R319 Hematuria, unspecified: Secondary | ICD-10-CM | POA: Diagnosis not present

## 2018-03-10 LAB — POCT URINALYSIS DIPSTICK
Bilirubin, UA: NEGATIVE
GLUCOSE UA: NEGATIVE
KETONES UA: NEGATIVE
Nitrite, UA: NEGATIVE
Protein, UA: NEGATIVE
SPEC GRAV UA: 1.01 (ref 1.010–1.025)
Urobilinogen, UA: 0.2 E.U./dL
pH, UA: 6.5 (ref 5.0–8.0)

## 2018-03-10 MED ORDER — SULFAMETHOXAZOLE-TRIMETHOPRIM 800-160 MG PO TABS
1.0000 | ORAL_TABLET | Freq: Two times a day (BID) | ORAL | 0 refills | Status: DC
Start: 2018-03-10 — End: 2018-10-14

## 2018-03-10 NOTE — Patient Instructions (Signed)
Happy Holidays!     Urinary Tract Infection, Adult A urinary tract infection (UTI) is an infection of any part of the urinary tract. The urinary tract includes the:  Kidneys.  Ureters.  Bladder.  Urethra.  These organs make, store, and get rid of pee (urine) in the body. Follow these instructions at home:  Take over-the-counter and prescription medicines only as told by your doctor.  If you were prescribed an antibiotic medicine, take it as told by your doctor. Do not stop taking the antibiotic even if you start to feel better.  Avoid the following drinks: ? Alcohol. ? Caffeine. ? Tea. ? Carbonated drinks.  Drink enough fluid to keep your pee clear or pale yellow.  Keep all follow-up visits as told by your doctor. This is important.  Make sure to: ? Empty your bladder often and completely. Do not to hold pee for long periods of time. ? Empty your bladder before and after sex. ? Wipe from front to back after a bowel movement if you are female. Use each tissue one time when you wipe. Contact a doctor if:  You have back pain.  You have a fever.  You feel sick to your stomach (nauseous).  You throw up (vomit).  Your symptoms do not get better after 3 days.  Your symptoms go away and then come back. Get help right away if:  You have very bad back pain.  You have very bad lower belly (abdominal) pain.  You are throwing up and cannot keep down any medicines or water. This information is not intended to replace advice given to you by your health care provider. Make sure you discuss any questions you have with your health care provider. Document Released: 09/25/2007 Document Revised: 09/14/2015 Document Reviewed: 02/27/2015 Elsevier Interactive Patient Education  [employer].

## 2018-03-10 NOTE — Progress Notes (Signed)
   Subjective:    Patient ID: Victoria Foley, female    DOB: Jan 31, 1951, 67 y.o.   MRN: [id number]  HPI  Presents to clinic c/o frequent urination for 2 weeks.  Patient does also report some burning with urination at times, but not every time she uses the restroom.  Denies any fever or chills.  Denies nausea, vomiting or diarrhea.  Patient Active Problem List   Diagnosis Date Noted  . Left foot pain 01/13/2018  . Other neutropenia ([place]) 10/22/2017  . Dermatitis 10/10/2017  . Leukopenia 10/10/2017  . Prediabetes 10/10/2017  . Annual physical exam 10/04/2016  . Emphysema 11/01/2011  . Hypertension 10/10/2011   Social History   Tobacco Use  . Smoking status: Never Smoker  . Smokeless tobacco: Never Used  Substance Use Topics  . Alcohol use: No   Review of Systems  Constitutional: Negative for chills, fatigue and fever.  HENT: Negative for congestion, ear pain, sinus pain and sore throat.   Eyes: Negative.   Respiratory: Negative for cough, shortness of breath and wheezing.   Cardiovascular: Negative for chest pain, palpitations and leg swelling.  Gastrointestinal: Negative for abdominal pain, diarrhea, nausea and vomiting.  Genitourinary: + dysuria, frequency and urgency.  Musculoskeletal: Negative for arthralgias and myalgias.  Skin: Negative for color change, pallor and rash.  Neurological: Negative for syncope, light-headedness and headaches.  Psychiatric/Behavioral: The patient is not nervous/anxious.       Objective:   Physical Exam  Constitutional: She is oriented to person, place, and time. She appears well-nourished. No distress.  HENT:  Head: Normocephalic and atraumatic.  Eyes: Conjunctivae and EOM are normal. No scleral icterus.  Neck: Neck supple. No tracheal deviation present.  Cardiovascular: Normal rate and regular rhythm.  Pulmonary/Chest: Effort normal and breath sounds normal. No respiratory distress.  Abdominal: Soft. Bowel sounds are normal. There  is tenderness (mild suprapubic tenderness. ).  Musculoskeletal: She exhibits no edema.  Neurological: She is alert and oriented to person, place, and time.  Skin: Skin is warm and dry. No pallor.  Psychiatric: She has a normal mood and affect. Her behavior is normal.  Nursing note and vitals reviewed.  Vitals:   03/10/18 1111  BP: 130/88  Pulse: 97  Temp: 97.6 F (36.4 C)  SpO2: 98%      Assessment & Plan:   UTI, dysuria- urinalysis is positive for some blood and leukocytes.  We will send urine culture to lab as well.  Patient will take Bactrim twice daily for 5 days.  Advised to increase water intake, avoid excess sugary and or caffeinated beverages.  Patient will be contacted with urine culture results.  We will recheck urinalysis microscopic in approximately 4 weeks to be sure her blood has resolved.  Keep regularly scheduled follow-up with PCP.  Return to clinic sooner if any issues arise or if current symptoms persist or worsen.

## 2018-03-12 LAB — URINE CULTURE
MICRO NUMBER:: 91392277
SPECIMEN QUALITY: ADEQUATE

## 2018-03-30 ENCOUNTER — Other Ambulatory Visit: Payer: BLUE CROSS/BLUE SHIELD

## 2018-03-30 DIAGNOSIS — R319 Hematuria, unspecified: Secondary | ICD-10-CM | POA: Diagnosis not present

## 2018-03-30 DIAGNOSIS — N390 Urinary tract infection, site not specified: Secondary | ICD-10-CM | POA: Diagnosis not present

## 2018-03-31 LAB — URINALYSIS, ROUTINE W REFLEX MICROSCOPIC
BILIRUBIN UA: NEGATIVE
Glucose, UA: NEGATIVE
Ketones, UA: NEGATIVE
Leukocytes, UA: NEGATIVE
NITRITE UA: NEGATIVE
PH UA: 6.5 (ref 5.0–7.5)
PROTEIN UA: NEGATIVE
RBC UA: NEGATIVE
Specific Gravity, UA: 1.006 (ref 1.005–1.030)
UUROB: 0.2 mg/dL (ref 0.2–1.0)

## 2018-04-08 ENCOUNTER — Ambulatory Visit: Payer: BLUE CROSS/BLUE SHIELD | Admitting: Internal Medicine

## 2018-04-08 DIAGNOSIS — Z0289 Encounter for other administrative examinations: Secondary | ICD-10-CM

## 2018-04-29 ENCOUNTER — Inpatient Hospital Stay: Payer: BLUE CROSS/BLUE SHIELD

## 2018-04-29 ENCOUNTER — Inpatient Hospital Stay: Payer: BLUE CROSS/BLUE SHIELD | Admitting: Internal Medicine

## 2018-05-22 ENCOUNTER — Ambulatory Visit
Admission: RE | Admit: 2018-05-22 | Discharge: 2018-05-22 | Disposition: A | Discharge status: Home / Self Care | Payer: BLUE CROSS/BLUE SHIELD | Source: Ambulatory Visit | Attending: Internal Medicine | Admitting: Internal Medicine

## 2018-05-22 DIAGNOSIS — R928 Other abnormal and inconclusive findings on diagnostic imaging of breast: Secondary | ICD-10-CM | POA: Diagnosis not present

## 2018-05-22 DIAGNOSIS — N6001 Solitary cyst of right breast: Secondary | ICD-10-CM | POA: Insufficient documentation

## 2018-05-25 ENCOUNTER — Other Ambulatory Visit: Payer: Self-pay | Admitting: Internal Medicine

## 2018-05-25 DIAGNOSIS — Z1231 Encounter for screening mammogram for malignant neoplasm of breast: Secondary | ICD-10-CM

## 2018-05-25 DIAGNOSIS — N6310 Unspecified lump in the right breast, unspecified quadrant: Secondary | ICD-10-CM

## 2018-07-26 ENCOUNTER — Encounter: Payer: Self-pay | Admitting: Internal Medicine

## 2018-07-27 ENCOUNTER — Other Ambulatory Visit: Payer: Self-pay | Admitting: Internal Medicine

## 2018-07-27 ENCOUNTER — Telehealth: Payer: Self-pay | Admitting: Internal Medicine

## 2018-07-27 DIAGNOSIS — N3000 Acute cystitis without hematuria: Secondary | ICD-10-CM

## 2018-07-27 NOTE — Telephone Encounter (Signed)
Please schedule a telephone or video visit for this  But 1st pt needs to drop off urine for culture today if possible   Thanks [place]

## 2018-07-27 NOTE — Telephone Encounter (Signed)
patient has been scheduled for tomorrow

## 2018-07-28 ENCOUNTER — Other Ambulatory Visit: Payer: Self-pay

## 2018-07-28 ENCOUNTER — Encounter: Payer: Self-pay | Admitting: Internal Medicine

## 2018-07-28 ENCOUNTER — Ambulatory Visit (INDEPENDENT_AMBULATORY_CARE_PROVIDER_SITE_OTHER): Payer: BLUE CROSS/BLUE SHIELD | Admitting: Internal Medicine

## 2018-07-28 ENCOUNTER — Other Ambulatory Visit: Payer: BLUE CROSS/BLUE SHIELD

## 2018-07-28 DIAGNOSIS — N3000 Acute cystitis without hematuria: Secondary | ICD-10-CM | POA: Diagnosis not present

## 2018-07-28 NOTE — Addendum Note (Signed)
Addended by: Arby Barrette on: 07/28/2018 10:43 AM   Modules accepted: Orders

## 2018-07-28 NOTE — Progress Notes (Signed)
Telemedicine Note  I connected with Victoria Foley on 07/28/18 at  9:35 AM EDT by a video enabled telemedicine application and verified that I am speaking with the correct person using two identifiers.  Location patient: home Location provider:work  Persons participating in the virtual visit: patient, provider  I discussed the limitations of evaluation and management by telemedicine and the availability of in person appointments. The patient expressed understanding and agreed to proceed.   HPI: Saturday 07/25/2018 noticed increased urinary frequency and tingling but now sx's have improved. She has only tried increased water intake nothing else and has had UTI before. No fever     ROS: See pertinent positives and negatives per HPI.  Past Medical History:  Diagnosis Date  . Hypertension   . Prediabetes     Past Surgical History:  Procedure Laterality Date  . CESAREAN SECTION    . FEMUR SURGERY     left, s/p rod for fracture  . TUBAL LIGATION      Family History  Problem Relation Age of Onset  . Diabetes Mother   . Hypertension Mother   . Heart disease Mother   . Cancer Mother        breast and stomach  . Breast cancer Mother 62  . Diabetes Father   . Hypertension Father   . Heart disease Father   . Kidney disease Father     SOCIAL HX: married    Current Outpatient Medications:  .  fluticasone (FLONASE) 50 MCG/ACT nasal spray, USE TWO SPRAYS IN EACH NOSTRIL ONCE DAILY, Disp: 16 g, Rfl: 1 .  losartan (COZAAR) 100 MG tablet, Take 1 tablet (100 mg total) by mouth daily., Disp: 90 tablet, Rfl: 3 .  sulfamethoxazole-trimethoprim (BACTRIM DS,SEPTRA DS) 800-160 MG tablet, Take 1 tablet by mouth 2 (two) times daily., Disp: 10 tablet, Rfl: 0 .  triamcinolone cream (KENALOG) 0.1 %, Apply 1 application topically 2 (two) times daily., Disp: 45 g, Rfl: 2  EXAM:  VITALS per patient if applicable:  GENERAL: alert, oriented, appears well and in no acute distress  HEENT:  atraumatic, conjunttiva clear, no obvious abnormalities on inspection of external nose and ears  NECK: normal movements of the head and neck  LUNGS: on inspection no signs of respiratory distress, breathing rate appears normal, no obvious gross SOB, gasping or wheezing  CV: no obvious cyanosis  MS: moves all visible extremities without noticeable abnormality  PSYCH/NEURO: pleasant and cooperative, no obvious depression or anxiety, speech and thought processing grossly intact  ASSESSMENT AND PLAN:  Discussed the following assessment and plan:  Acute cystitis without hematuria UA and culture today at 10:45 am  Increase water  Can try AZO with pyridium (this will turn urine orange), AZO with cranberry or organic cranberry juice Pt to wait for now on antibiotics x 2 days when culture result is back    I discussed the assessment and treatment plan with the patient. The patient was provided an opportunity to ask questions and all were answered. The patient agreed with the plan and demonstrated an understanding of the instructions.   The patient was advised to call back or seek an in-person evaluation if the symptoms worsen or if the condition fails to improve as anticipated.  I provided 5-10 minutes of non-face-to-face time during this encounter.   Nino Glow McLean-Scocuzza, MD

## 2018-07-29 LAB — URINALYSIS, ROUTINE W REFLEX MICROSCOPIC
Bilirubin, UA: NEGATIVE
Glucose, UA: NEGATIVE
Ketones, UA: NEGATIVE
Nitrite, UA: NEGATIVE
Protein,UA: NEGATIVE
RBC, UA: NEGATIVE
Specific Gravity, UA: 1.008 (ref 1.005–1.030)
Urobilinogen, Ur: 0.2 mg/dL (ref 0.2–1.0)
pH, UA: 7.5 (ref 5.0–7.5)

## 2018-07-29 LAB — MICROSCOPIC EXAMINATION
Casts: NONE SEEN /lpf
Epithelial Cells (non renal): NONE SEEN /hpf (ref 0–10)

## 2018-07-30 ENCOUNTER — Other Ambulatory Visit: Payer: Self-pay | Admitting: Internal Medicine

## 2018-07-30 DIAGNOSIS — N3000 Acute cystitis without hematuria: Secondary | ICD-10-CM

## 2018-07-30 LAB — URINE CULTURE

## 2018-07-30 MED ORDER — CIPROFLOXACIN HCL 500 MG PO TABS: 500.0000 mg | ORAL_TABLET | Freq: Two times a day (BID) | ORAL | 0 refills | Status: DC

## 2018-10-14 ENCOUNTER — Ambulatory Visit (INDEPENDENT_AMBULATORY_CARE_PROVIDER_SITE_OTHER): Payer: BC Managed Care – PPO | Admitting: Internal Medicine

## 2018-10-14 ENCOUNTER — Other Ambulatory Visit: Payer: Self-pay

## 2018-10-14 DIAGNOSIS — J309 Allergic rhinitis, unspecified: Secondary | ICD-10-CM

## 2018-10-14 DIAGNOSIS — Z1329 Encounter for screening for other suspected endocrine disorder: Secondary | ICD-10-CM | POA: Diagnosis not present

## 2018-10-14 DIAGNOSIS — Z1231 Encounter for screening mammogram for malignant neoplasm of breast: Secondary | ICD-10-CM

## 2018-10-14 DIAGNOSIS — I10 Essential (primary) hypertension: Secondary | ICD-10-CM | POA: Diagnosis not present

## 2018-10-14 DIAGNOSIS — N3000 Acute cystitis without hematuria: Secondary | ICD-10-CM

## 2018-10-14 DIAGNOSIS — E785 Hyperlipidemia, unspecified: Secondary | ICD-10-CM

## 2018-10-14 DIAGNOSIS — N6001 Solitary cyst of right breast: Secondary | ICD-10-CM

## 2018-10-14 MED ORDER — LORATADINE 10 MG PO TABS: 10.0000 mg | ORAL_TABLET | Freq: Every day | ORAL | 3 refills | Status: DC | PRN

## 2018-10-14 MED ORDER — FLUTICASONE PROPIONATE 50 MCG/ACT NA SUSP: NASAL | 12 refills | Status: DC

## 2018-10-14 MED ORDER — LOSARTAN POTASSIUM 100 MG PO TABS: 100.0000 mg | ORAL_TABLET | Freq: Every day | ORAL | 3 refills | Status: DC

## 2018-10-14 NOTE — Progress Notes (Signed)
Virtual Visit via Video Note  I connected with Victoria Foley   on 10/14/18 at  8:30 AM EDT by a video enabled telemedicine application and verified that I am speaking with the correct person using two identifiers.  Location patient: home Location provider:work  Persons participating in the virtual visit: patient, provider  I discussed the limitations of evaluation and management by telemedicine and the availability of in person appointments. The patient expressed understanding and agreed to proceed.   HPI: 1. HTN on losartan 100 mg qd  BP 6/23 143/70 then 130/70 she has not been checking her BP daily  2. UTI sx's resolved  3. HLD will repeat lipid upcoming  4. Chronic pain in b/l feet she f/u with podiatry and had injection in her left foot which caused numbness periodically which comes and goes since injection  5. Allergies seasonal worse in spring she wants refill of flonase and also take otc claritin    ROS: See pertinent positives and negatives per HPI.  Past Medical History:  Diagnosis Date  . Hypertension   . Prediabetes     Past Surgical History:  Procedure Laterality Date  . CESAREAN SECTION    . FEMUR SURGERY     left, s/p rod for fracture  . TUBAL LIGATION      Family History  Problem Relation Age of Onset  . Diabetes Mother   . Hypertension Mother   . Heart disease Mother   . Cancer Mother        breast and stomach  . Breast cancer Mother 41  . Diabetes Father   . Hypertension Father   . Heart disease Father   . Kidney disease Father     SOCIAL HX: married lives a home    Current Outpatient Medications:  .  fluticasone (FLONASE) 50 MCG/ACT nasal spray, USE TWO SPRAYS IN EACH NOSTRIL ONCE DAILY prn, Disp: 16 g, Rfl: 12 .  loratadine (CLARITIN) 10 MG tablet, Take 1 tablet (10 mg total) by mouth daily as needed for allergies., Disp: 90 tablet, Rfl: 3 .  losartan (COZAAR) 100 MG tablet, Take 1 tablet (100 mg total) by mouth daily., Disp: 90 tablet, Rfl:  3 .  triamcinolone cream (KENALOG) 0.1 %, Apply 1 application topically 2 (two) times daily., Disp: 45 g, Rfl: 2  EXAM:  VITALS per patient if applicable:  GENERAL: alert, oriented, appears well and in no acute distress  HEENT: atraumatic, conjunttiva clear, no obvious abnormalities on inspection of external nose and ears  NECK: normal movements of the head and neck  LUNGS: on inspection no signs of respiratory distress, breathing rate appears normal, no obvious gross SOB, gasping or wheezing  CV: no obvious cyanosis  MS: moves all visible extremities without noticeable abnormality  PSYCH/NEURO: pleasant and cooperative, no obvious depression or anxiety, speech and thought processing grossly intact  ASSESSMENT AND PLAN:  Discussed the following assessment and plan:  Acute cystitis without hematuria - Plan: Urinalysis, Routine w reflex microscopic, Urine Culture  Essential hypertension - Plan: losartan (COZAAR) 100 MG tablet, Comprehensive metabolic panel, CBC w/Diff, Lipid panel My chart in 2 weeks if BP not <130/<80 will rec norvasc 2.5 mg qd pt thinks allergic to sulfa   Hyperlipidemia, unspecified hyperlipidemia type - Plan: Lipid panel   Cyst of right breast - Plan: MM DIAG BREAST TOMO BILATERAL ordered    Allergic rhinitis, unspecified seasonality, unspecified trigger - Plan: loratadine (CLARITIN) 10 MG tablet, fluticasone (FLONASE) 50 MCG/ACT nasal spray  HM Declines  vaccines will redisc Tdap in future   Out of age window pap had 10/04/16 normal no HPV no h/o abnormal  Colonoscopy had 07/23/13 Dr. Tiffany Kocher hyperplastic polyp repeat in 10 years  mammo 10/2017 oil cyst right breast repeat dx mammo in 6 months -re ordered mammo 11/20/18 had 05/22/18 rec b/l diag in 6 months right breast cyst   DEXA 07/04/09 normal  Declines STD check, hep B Hep C neg 10/04/16  sch fasting labs  rec healthy diet choices and exercise     I discussed the assessment and treatment plan  with the patient. The patient was provided an opportunity to ask questions and all were answered. The patient agreed with the plan and demonstrated an understanding of the instructions.   The patient was advised to call back or seek an in-person evaluation if the symptoms worsen or if the condition fails to improve as anticipated.  Time spent 15 minutes  Delorise Jackson, MD

## 2018-10-14 NOTE — Patient Instructions (Signed)
Consider alpha lipoic acid 600 mg up to 2x per day for numbness in foot   Paresthesia Paresthesia is an abnormal burning or prickling sensation. It is usually felt in the hands, arms, legs, or feet. However, it may occur in any part of the body. Usually, paresthesia is not painful. It may feel like:  Tingling or numbness.  Pins and needles.  Skin crawling.  Buzzing.  Arms or legs falling asleep.  Itching. Paresthesia may occur without any clear cause, or it may be caused by:  Breathing too quickly (hyperventilation).  Pressure on a nerve.  An underlying medical condition.  Side effects of a medication.  Nutritional deficiencies.  Exposure to toxic chemicals. Most people experience temporary (transient) paresthesia at some time in their lives. For some people, it may be long-lasting (chronic) because of an underlying medical condition. If you have paresthesia that lasts a long time, you may need to be evaluated by your health care provider. Follow these instructions at home: Alcohol use   Do not drink alcohol if: ? Your health care provider tells you not to drink. ? You are pregnant, may be pregnant, or are planning to become pregnant.  If you drink alcohol, limit how much you have: ? 0-1 drink a day for women. ? 0-2 drinks a day for men.  Be aware of how much alcohol is in your drink. In the [place] one drink equals one typical bottle of beer (12 oz), one-half glass of wine (5 oz), or one shot of hard liquor (1 oz). Nutrition  Eat a healthy diet. This includes: ? Eating foods that are high in fiber, such as fresh fruits and vegetables, whole grains, and beans. ? Limiting foods that are high in fat and processed sugars, such as fried or sweet foods. General instructions  Take over-the-counter and prescription medicines only as told by your health care provider.  Do not use any products that contain nicotine or tobacco, such as cigarettes and e-cigarettes. These can  keep blood from reaching damaged nerves. If you need help quitting, ask your health care provider.  If you have diabetes, work closely with your health care provider to keep your blood sugar under control.  If you have numbness in your feet: ? Check every day for signs of injury or infection. Watch for redness, warmth, and swelling. ? Wear padded socks and comfortable shoes. These help protect your feet.  Keep all follow-up visits as told by your health care provider. This is important. Contact a health care provider if you:  Have paresthesia that gets worse or does not go away.  Have a burning or prickling feeling that gets worse when you walk.  Have pain, cramps, or dizziness.  Develop a rash. Get help right away if you:  Feel weak.  Have trouble walking or moving.  Have problems with speech, understanding, or vision.  Feel confused.  Cannot control your bladder or bowel movements.  Have numbness after an injury.  Develop new weakness in an arm or leg.  Faint. Summary  Paresthesia is an abnormal burning or prickling sensation that is usually felt in the hands, arms, legs, or feet. It may also occur in other parts of the body.  Paresthesia may occur without any clear cause, or it may be caused by breathing too quickly (hyperventilation), pressure on a nerve, an underlying medical condition, side effects of a medication, nutritional deficiencies, or exposure to toxic chemicals.  If you have paresthesia that lasts a long time, you  may need to be evaluated by your health care provider. This information is not intended to replace advice given to you by your health care provider. Make sure you discuss any questions you have with your health care provider. Document Released: 03/29/2002 Document Revised: 04/17/2017 Document Reviewed: 04/17/2017 Elsevier Interactive Patient Education  2019 [employer].

## 2018-10-27 ENCOUNTER — Other Ambulatory Visit: Payer: BC Managed Care – PPO

## 2018-11-04 ENCOUNTER — Other Ambulatory Visit: Payer: Self-pay

## 2018-11-04 ENCOUNTER — Other Ambulatory Visit (INDEPENDENT_AMBULATORY_CARE_PROVIDER_SITE_OTHER): Payer: BC Managed Care – PPO

## 2018-11-04 DIAGNOSIS — Z1329 Encounter for screening for other suspected endocrine disorder: Secondary | ICD-10-CM | POA: Diagnosis not present

## 2018-11-04 DIAGNOSIS — E785 Hyperlipidemia, unspecified: Secondary | ICD-10-CM

## 2018-11-04 DIAGNOSIS — N3000 Acute cystitis without hematuria: Secondary | ICD-10-CM

## 2018-11-04 DIAGNOSIS — I10 Essential (primary) hypertension: Secondary | ICD-10-CM

## 2018-11-04 LAB — COMPREHENSIVE METABOLIC PANEL
ALT: 14 U/L (ref 0–35)
AST: 18 U/L (ref 0–37)
Albumin: 4.3 g/dL (ref 3.5–5.2)
Alkaline Phosphatase: 80 U/L (ref 39–117)
BUN: 18 mg/dL (ref 6–23)
CO2: 26 mEq/L (ref 19–32)
Calcium: 8.9 mg/dL (ref 8.4–10.5)
Chloride: 108 mEq/L (ref 96–112)
Creatinine, Ser: 0.85 mg/dL (ref 0.40–1.20)
GFR: 80.72 mL/min (ref >60.00)
Glucose, Bld: 96 mg/dL (ref 70–99)
Potassium: 4.3 mEq/L (ref 3.5–5.1)
Sodium: 141 mEq/L (ref 135–145)
Total Bilirubin: 0.6 mg/dL (ref 0.2–1.2)
Total Protein: 6.7 g/dL (ref 6.0–8.3)

## 2018-11-04 LAB — CBC WITH DIFFERENTIAL/PLATELET
Basophils Absolute: 0 10*3/uL (ref 0.0–0.1)
Basophils Relative: 0.7 % (ref 0.0–3.0)
Eosinophils Absolute: 0.1 10*3/uL (ref 0.0–0.7)
Eosinophils Relative: 2.1 % (ref 0.0–5.0)
HCT: 38.7 % (ref 36.0–46.0)
Hemoglobin: 13 g/dL (ref 12.0–15.0)
Lymphocytes Relative: 49 % — ABNORMAL HIGH (ref 12.0–46.0)
Lymphs Abs: 1.6 10*3/uL (ref 0.7–4.0)
MCHC: 33.5 g/dL (ref 30.0–36.0)
MCV: 95.1 fl (ref 78.0–100.0)
Monocytes Absolute: 0.3 10*3/uL (ref 0.1–1.0)
Monocytes Relative: 8 % (ref 3.0–12.0)
Neutro Abs: 1.3 10*3/uL — ABNORMAL LOW (ref 1.4–7.7)
Neutrophils Relative %: 40.2 % — ABNORMAL LOW (ref 43.0–77.0)
Platelets: 234 10*3/uL (ref 150.0–400.0)
RBC: 4.07 Mil/uL (ref 3.87–5.11)
RDW: 13.1 % (ref 11.5–15.5)
WBC: 3.2 10*3/uL — ABNORMAL LOW (ref 4.0–10.5)

## 2018-11-04 LAB — LIPID PANEL
Cholesterol: 136 mg/dL (ref 0–200)
HDL: 62.3 mg/dL (ref >39.00)
LDL Cholesterol: 65 mg/dL (ref 0–99)
NonHDL: 73.7
Total CHOL/HDL Ratio: 2
Triglycerides: 44 mg/dL (ref 0.0–149.0)
VLDL: 8.8 mg/dL (ref 0.0–40.0)

## 2018-11-04 LAB — TSH: TSH: 2.31 u[IU]/mL (ref 0.35–4.50)

## 2018-11-05 ENCOUNTER — Ambulatory Visit
Admission: RE | Admit: 2018-11-05 | Discharge: 2018-11-05 | Disposition: A | Discharge status: Home / Self Care | Payer: BC Managed Care – PPO | Source: Ambulatory Visit | Attending: Internal Medicine | Admitting: Internal Medicine

## 2018-11-05 DIAGNOSIS — Z1231 Encounter for screening mammogram for malignant neoplasm of breast: Secondary | ICD-10-CM | POA: Diagnosis not present

## 2018-11-05 DIAGNOSIS — N6310 Unspecified lump in the right breast, unspecified quadrant: Secondary | ICD-10-CM

## 2018-11-05 DIAGNOSIS — N6001 Solitary cyst of right breast: Secondary | ICD-10-CM | POA: Insufficient documentation

## 2018-11-05 DIAGNOSIS — R928 Other abnormal and inconclusive findings on diagnostic imaging of breast: Secondary | ICD-10-CM | POA: Diagnosis not present

## 2018-11-06 LAB — URINE CULTURE
MICRO NUMBER:: 669822
Result:: NO GROWTH
SPECIMEN QUALITY:: ADEQUATE

## 2018-11-06 LAB — URINALYSIS, ROUTINE W REFLEX MICROSCOPIC
Bacteria, UA: NONE SEEN /HPF
Bilirubin Urine: NEGATIVE
Glucose, UA: NEGATIVE
Hgb urine dipstick: NEGATIVE
Hyaline Cast: NONE SEEN /LPF
Ketones, ur: NEGATIVE
Nitrite: NEGATIVE
Protein, ur: NEGATIVE
Specific Gravity, Urine: 1.018 (ref 1.001–1.03)
pH: <=5 (ref 5.0–8.0)

## 2018-12-14 ENCOUNTER — Encounter: Payer: Self-pay | Admitting: Internal Medicine

## 2019-02-18 ENCOUNTER — Ambulatory Visit: Payer: BC Managed Care – PPO | Admitting: Internal Medicine

## 2019-04-15 ENCOUNTER — Other Ambulatory Visit: Payer: BC Managed Care – PPO

## 2019-06-14 ENCOUNTER — Ambulatory Visit: Payer: Self-pay | Attending: Internal Medicine

## 2019-06-14 DIAGNOSIS — Z23 Encounter for immunization: Secondary | ICD-10-CM | POA: Insufficient documentation

## 2019-06-14 NOTE — Progress Notes (Signed)
   Covid-19 Vaccination Clinic  Name:  Victoria Foley    MRN: [id number] DOB: Apr 07, 1951  06/14/2019  [name] was observed post Covid-19 immunization for 15 minutes without incidence. She was provided with Vaccine Information Sheet and instruction to access the V-Safe system.   [name] was instructed to call 911 with any severe reactions post vaccine: [name] Difficulty breathing  . Swelling of your face and throat  . A fast heartbeat  . A bad rash all over your body  . Dizziness and weakness    Immunizations Administered    Name Date Dose VIS Date Route   Moderna COVID-19 Vaccine 06/14/2019  4:38 PM 0.5 mL 03/23/2019 Intramuscular   Manufacturer: Moderna   Lot: [id number]   [place]: 23953-202-33

## 2019-07-13 ENCOUNTER — Ambulatory Visit: Payer: Self-pay | Attending: Internal Medicine

## 2019-07-13 DIAGNOSIS — Z23 Encounter for immunization: Secondary | ICD-10-CM

## 2019-07-13 NOTE — Progress Notes (Signed)
   Covid-19 Vaccination Clinic  Name:  Victoria Foley    MRN: [id number] DOB: 1950-08-06  07/13/2019  Ms. Shepler was observed post Covid-19 immunization for 15 minutes without incident. She was provided with Vaccine Information Sheet and instruction to access the V-Safe system.   Ms. Seibold was instructed to call 911 with any severe reactions post vaccine: [name] Difficulty breathing  . Swelling of face and throat  . A fast heartbeat  . A bad rash all over body  . Dizziness and weakness   Immunizations Administered    Name Date Dose VIS Date Route   Moderna COVID-19 Vaccine 07/13/2019  3:32 PM 0.5 mL 03/23/2019 Intramuscular   Manufacturer: Levan Hurst   Lot: [id number]   [place]: [id number]

## 2019-07-19 ENCOUNTER — Telehealth: Payer: Self-pay | Admitting: Internal Medicine

## 2019-07-19 NOTE — Telephone Encounter (Signed)
Please call and schedule pt f/u in office   [place]

## 2019-07-21 ENCOUNTER — Ambulatory Visit (INDEPENDENT_AMBULATORY_CARE_PROVIDER_SITE_OTHER): Payer: BC Managed Care – PPO | Admitting: Internal Medicine

## 2019-07-21 ENCOUNTER — Encounter: Payer: Self-pay | Admitting: Internal Medicine

## 2019-07-21 ENCOUNTER — Other Ambulatory Visit: Payer: Self-pay

## 2019-07-21 VITALS — BP 130/78 | HR 84 | Temp 97.7°F | Ht 59.0 in | Wt 162.4 lb

## 2019-07-21 DIAGNOSIS — H9319 Tinnitus, unspecified ear: Secondary | ICD-10-CM

## 2019-07-21 DIAGNOSIS — I10 Essential (primary) hypertension: Secondary | ICD-10-CM | POA: Diagnosis not present

## 2019-07-21 DIAGNOSIS — L739 Follicular disorder, unspecified: Secondary | ICD-10-CM

## 2019-07-21 DIAGNOSIS — N3000 Acute cystitis without hematuria: Secondary | ICD-10-CM | POA: Diagnosis not present

## 2019-07-21 DIAGNOSIS — J309 Allergic rhinitis, unspecified: Secondary | ICD-10-CM

## 2019-07-21 DIAGNOSIS — R928 Other abnormal and inconclusive findings on diagnostic imaging of breast: Secondary | ICD-10-CM

## 2019-07-21 DIAGNOSIS — R7303 Prediabetes: Secondary | ICD-10-CM

## 2019-07-21 DIAGNOSIS — N6310 Unspecified lump in the right breast, unspecified quadrant: Secondary | ICD-10-CM

## 2019-07-21 MED ORDER — LORATADINE 10 MG PO TABS: 10.0000 mg | ORAL_TABLET | Freq: Every day | ORAL | 3 refills | Status: DC | PRN

## 2019-07-21 MED ORDER — HYDROCORTISONE 2.5 % EX CREA: TOPICAL_CREAM | Freq: Two times a day (BID) | CUTANEOUS | 2 refills | Status: DC

## 2019-07-21 MED ORDER — FLUTICASONE PROPIONATE 50 MCG/ACT NA SUSP: NASAL | 12 refills | Status: DC

## 2019-07-21 MED ORDER — CLINDAMYCIN PHOSPHATE 1 % EX LOTN: TOPICAL_LOTION | Freq: Two times a day (BID) | CUTANEOUS | 11 refills | Status: DC

## 2019-07-21 MED ORDER — LOSARTAN POTASSIUM 100 MG PO TABS: 100.0000 mg | ORAL_TABLET | Freq: Every day | ORAL | 3 refills | Status: DC

## 2019-07-21 NOTE — Patient Instructions (Addendum)
[employer] Essential deodorant Rosemary Lavender  Jason deodorant    Folliculitis  Folliculitis is inflammation of the hair follicles. Folliculitis most commonly occurs on the scalp, thighs, legs, back, and buttocks. However, it can occur anywhere on the body. What are the causes? This condition may be caused by:  A bacterial infection (common).  A fungal infection.  A viral infection.  Contact with certain chemicals, especially oils and tars.  Shaving or waxing.  Greasy ointments or creams applied to the skin. Long-lasting folliculitis and folliculitis that keeps coming back may be caused by bacteria. This bacteria can live anywhere on your skin and is often found in the nostrils. What increases the risk? You are more likely to develop this condition if you have:  A weakened immune system.  Diabetes.  Obesity. What are the signs or symptoms? Symptoms of this condition include:  Redness.  Soreness.  Swelling.  Itching.  Small white or yellow, pus-filled, itchy spots (pustules) that appear over a reddened area. If there is an infection that goes deep into the follicle, these may develop into a boil (furuncle).  A group of closely packed boils (carbuncle). These tend to form in hairy, sweaty areas of the body. How is this diagnosed? This condition is diagnosed with a skin exam. To find what is causing the condition, your health care provider may take a sample of one of the pustules or boils for testing in a lab. How is this treated? This condition may be treated by:  Applying warm compresses to the affected areas.  Taking an antibiotic medicine or applying an antibiotic medicine to the skin.  Applying or bathing with an antiseptic solution.  Taking an over-the-counter medicine to help with itching.  Having a procedure to drain any pustules or boils. This may be done if a pustule or boil contains a lot of pus or fluid.  Having laser hair removal. This may be  done to treat long-lasting folliculitis. Follow these instructions at home: Managing pain and swelling   If directed, apply heat to the affected area as often as told by your health care provider. Use the heat source that your health care provider recommends, such as a moist heat pack or a heating pad. ? Place a towel between your skin and the heat source. ? Leave the heat on for 20-30 minutes. ? Remove the heat if your skin turns bright red. This is especially important if you are unable to feel pain, heat, or cold. You may have a greater risk of getting burned. General instructions  If you were prescribed an antibiotic medicine, take it or apply it as told by your health care provider. Do not stop using the antibiotic even if your condition improves.  Check the irritated area every day for signs of infection. Check for: ? Redness, swelling, or pain. ? Fluid or blood. ? Warmth. ? Pus or a bad smell.  Do not shave irritated skin.  Take over-the-counter and prescription medicines only as told by your health care provider.  Keep all follow-up visits as told by your health care provider. This is important. Get help right away if:  You have more redness, swelling, or pain in the affected area.  Red streaks are spreading from the affected area.  You have a fever. Summary  Folliculitis is inflammation of the hair follicles. Folliculitis most commonly occurs on the scalp, thighs, legs, back, and buttocks.  This condition may be treated by taking an antibiotic medicine or applying  an antibiotic medicine to the skin, and applying or bathing with an antiseptic solution.  If you were prescribed an antibiotic medicine, take it or apply it as told by your health care provider. Do not stop using the antibiotic even if your condition improves.  Get help right away if you have new or worsening symptoms.  Keep all follow-up visits as told by your health care provider. This is important. This  information is not intended to replace advice given to you by your health care provider. Make sure you discuss any questions you have with your health care provider. Document Revised: 11/15/2017 Document Reviewed: 11/15/2017 Elsevier Patient Education  [place].   Tinnitus Tinnitus refers to hearing a sound when there is no actual source for that sound. This is often described as ringing in the ears. However, people with this condition may hear a variety of noises, in one ear or in both ears. The sounds of tinnitus can be soft, loud, or somewhere in between. Tinnitus can last for a few seconds or can be constant for days. It may go away without treatment and come back at various times. When tinnitus is constant or happens often, it can lead to other problems, such as trouble sleeping and trouble concentrating. Almost everyone experiences tinnitus at some point. Tinnitus that is long-lasting (chronic) or comes back often (recurs) may require medical attention. What are the causes? The cause of tinnitus is often not known. In some cases, it can result from:  Exposure to loud noises from machinery, music, or other sources.  An object (foreign body) stuck in the ear.  Earwax buildup.  Drinking alcohol or caffeine.  Taking certain medicines.  Age-related hearing loss. It may also be caused by medical conditions such as:  Ear or sinus infections.  High blood pressure.  Heart diseases.  Anemia.  Allergies.  Meniere's disease.  Thyroid problems.  Tumors.  A weak, bulging blood vessel (aneurysm) near the ear. What are the signs or symptoms? The main symptom of tinnitus is hearing a sound when there is no source for that sound. It may sound like:  Buzzing.  Roaring.  Ringing.  Blowing air.  Hissing.  Whistling.  Sizzling.  Humming.  Running water.  A musical note.  Tapping. Symptoms may affect only one ear (unilateral) or both ears (bilateral). How  is this diagnosed? Tinnitus is diagnosed based on your symptoms, your medical history, and a physical exam. Your health care provider may do a thorough hearing test (audiologic exam) if your tinnitus:  Is unilateral.  Causes hearing difficulties.  Lasts 6 months or longer. You may work with a health care provider who specializes in hearing disorders (audiologist). You may be asked questions about your symptoms and how they affect your daily life. You may have other tests done, such as:  CT scan.  MRI.  An imaging test of how blood flows through your blood vessels (angiogram). How is this treated? Treating an underlying medical condition can sometimes make tinnitus go away. If your tinnitus continues, other treatments may include:  Medicines.  Therapy and counseling to help you manage the stress of living with tinnitus.  Sound generators to mask the tinnitus. These include: ? Tabletop sound machines that play relaxing sounds to help you fall asleep. ? Wearable devices that fit in your ear and play sounds or music. ? Acoustic neural stimulation. This involves using headphones to listen to music that contains an auditory signal. Over time, listening to this signal  may change some pathways in your brain and make you less sensitive to tinnitus. This treatment is used for very severe cases when no other treatment is working.  Using hearing aids or cochlear implants if your tinnitus is related to hearing loss. Hearing aids are worn in the outer ear. Cochlear implants are surgically placed in the inner ear. Follow these instructions at home: Managing symptoms      When possible, avoid being in loud places and being exposed to loud sounds.  Wear hearing protection, such as earplugs, when you are exposed to loud noises.  Use a white noise machine, a humidifier, or other devices to mask the sound of tinnitus.  Practice techniques for reducing stress, such as meditation, yoga, or deep  breathing. Work with your health care provider if you need help with managing stress.  Sleep with your head slightly raised. This may reduce the impact of tinnitus. General instructions  Do not use stimulants, such as nicotine, alcohol, or caffeine. Talk with your health care provider about other stimulants to avoid. Stimulants are substances that can make you feel alert and attentive by increasing certain activities in the body (such as heart rate and blood pressure). These substances may make tinnitus worse.  Take over-the-counter and prescription medicines only as told by your health care provider.  Try to get plenty of sleep each night.  Keep all follow-up visits as told by your health care provider. This is important. Contact a health care provider if:  Your tinnitus continues for 3 weeks or longer without stopping.  You develop sudden hearing loss.  Your symptoms get worse or do not get better with home care.  You feel you are not able to manage the stress of living with tinnitus. Get help right away if:  You develop tinnitus after a head injury.  You have tinnitus along with any of the following: ? Dizziness. ? Loss of balance. ? Nausea and vomiting. ? Sudden, severe headache. These symptoms may represent a serious problem that is an emergency. Do not wait to see if the symptoms will go away. Get medical help right away. Call your local emergency services (911 in the [place] Do not drive yourself to the hospital. Summary  Tinnitus refers to hearing a sound when there is no actual source for that sound. This is often described as ringing in the ears.  Symptoms may affect only one ear (unilateral) or both ears (bilateral).  Use a white noise machine, a humidifier, or other devices to mask the sound of tinnitus.  Do not use stimulants, such as nicotine, alcohol, or caffeine. Talk with your health care provider about other stimulants to avoid. These substances may make  tinnitus worse. This information is not intended to replace advice given to you by your health care provider. Make sure you discuss any questions you have with your health care provider. Document Revised: 10/21/2018 Document Reviewed: 01/16/2017 Elsevier Patient Education  2020 [employer].

## 2019-07-21 NOTE — Progress Notes (Signed)
Chief Complaint  Patient presents with  . Arm Problem    Underarm irritation from shaving no matter what products are used. Onset every year but worse this year.    F/u 1. HTN controlled on losartan 100 mg qd  2. Folliculitis worse x few months and worse this year using toms deo which is new and disposable razors  Rash is itching under arms tried cortisone otc    Review of Systems  Constitutional: Negative for weight loss.  Respiratory: Negative for shortness of breath.   Cardiovascular: Negative for chest pain.  Gastrointestinal: Negative for abdominal pain.  Musculoskeletal: Negative for falls.  Skin: Positive for itching and rash.  Neurological: Negative for headaches.  Psychiatric/Behavioral: Negative for memory loss.   Past Medical History:  Diagnosis Date  . Hypertension   . Prediabetes    Past Surgical History:  Procedure Laterality Date  . CESAREAN SECTION    . FEMUR SURGERY     left, s/p rod for fracture  . TUBAL LIGATION     Family History  Problem Relation Age of Onset  . Diabetes Mother   . Hypertension Mother   . Heart disease Mother   . Cancer Mother        breast and stomach  . Breast cancer Mother 74  . Diabetes Father   . Hypertension Father   . Heart disease Father   . Kidney disease Father    Social History   Socioeconomic History  . Marital status: Married    Spouse name: Not on file  . Number of children: Not on file  . Years of education: Not on file  . Highest education level: Not on file  Occupational History  . Not on file  Tobacco Use  . Smoking status: Never Smoker  . Smokeless tobacco: Never Used  Substance and Sexual Activity  . Alcohol use: No  . Drug use: Not on file  . Sexual activity: Not on file  Other Topics Concern  . Not on file  Social History Narrative   Lives in [place] with husband. No pets   2 daughters and 3 granddaughters      Work - [employer]   Diet - regular diet   Exercise - none   Social  Determinants of Radio broadcast assistant Strain:   . Difficulty of Paying Living Expenses:   Food Insecurity:   . Worried About Charity fundraiser in the Last Year:   . Arboriculturist in the Last Year:   Transportation Needs:   . Film/video editor (Medical):   [name] Lack of Transportation (Non-Medical):   Physical Activity:   . Days of Exercise per Week:   . Minutes of Exercise per Session:   Stress:   . Feeling of Stress :   Social Connections:   . Frequency of Communication with Friends and Family:   . Frequency of Social Gatherings with Friends and Family:   . Attends Religious Services:   . Active Member of Clubs or Organizations:   . Attends Archivist Meetings:   [name] Marital Status:   Intimate Partner Violence:   . Fear of Current or Ex-Partner:   . Emotionally Abused:   [name] Physically Abused:   . Sexually Abused:    Current Meds  Medication Sig  . fluticasone (FLONASE) 50 MCG/ACT nasal spray USE TWO SPRAYS IN EACH NOSTRIL ONCE DAILY prn  . loratadine (CLARITIN) 10 MG tablet Take 1 tablet (10 mg total)  by mouth daily as needed for allergies.  [name] losartan (COZAAR) 100 MG tablet Take 1 tablet (100 mg total) by mouth daily.  [name] triamcinolone cream (KENALOG) 0.1 % Apply 1 application topically 2 (two) times daily.  . [DISCONTINUED] fluticasone (FLONASE) 50 MCG/ACT nasal spray USE TWO SPRAYS IN EACH NOSTRIL ONCE DAILY prn  . [DISCONTINUED] loratadine (CLARITIN) 10 MG tablet Take 1 tablet (10 mg total) by mouth daily as needed for allergies.  . [DISCONTINUED] losartan (COZAAR) 100 MG tablet Take 1 tablet (100 mg total) by mouth daily.   Allergies  Allergen Reactions  . Diclofenac Hives  . Lisinopril     cough  . Sulfa Antibiotics     ? Allergy    No results found for this or any previous visit (from the past 2160 hour(s)). Objective  Body mass index is 32.8 kg/m. Wt Readings from Last 3 Encounters:  07/21/19 162 lb 6.4 oz (73.7 kg)  03/10/18 154 lb (69.9  kg)  01/13/18 153 lb 6.4 oz (69.6 kg)   Temp Readings from Last 3 Encounters:  07/21/19 97.7 F (36.5 C) (Temporal)  03/10/18 97.6 F (36.4 C) (Oral)  01/13/18 97.6 F (36.4 C) (Oral)   BP Readings from Last 3 Encounters:  07/21/19 130/78  03/10/18 130/88  01/13/18 138/88   Pulse Readings from Last 3 Encounters:  07/21/19 84  03/10/18 97  01/13/18 80    Physical Exam Vitals and nursing note reviewed.  Constitutional:      Appearance: Normal appearance. She is well-developed and well-groomed.  HENT:     Head: Normocephalic and atraumatic.  Eyes:     Conjunctiva/sclera: Conjunctivae normal.     Pupils: Pupils are equal, round, and reactive to light.  Cardiovascular:     Rate and Rhythm: Normal rate and regular rhythm.     Heart sounds: Normal heart sounds. No murmur.  Pulmonary:     Effort: Pulmonary effort is normal.     Breath sounds: Normal breath sounds.  Abdominal:     General: Abdomen is flat. Bowel sounds are normal.     Tenderness: There is no abdominal tenderness.  Skin:    General: Skin is warm and dry.  Neurological:     General: No focal deficit present.     Mental Status: She is alert and oriented to person, place, and time. Mental status is at baseline.     Gait: Gait normal.  Psychiatric:        Attention and Perception: Attention and perception normal.        Mood and Affect: Mood and affect normal.        Speech: Speech normal.        Behavior: Behavior normal. Behavior is cooperative.        Thought Content: Thought content normal.        Cognition and Memory: Cognition and memory normal.        Judgment: Judgment normal.     Assessment  Plan  Folliculitis - Plan: clindamycin (CLEOCIN T) 1 % lotion, hydrocortisone 2.5 % cream  Essential hypertension - Plan: losartan (COZAAR) 100 MG tablet, Comprehensive metabolic panel, Lipid panel, CBC w/Diff  Allergic rhinitis, unspecified seasonality, unspecified trigger - Plan: loratadine (CLARITIN)  10 MG tablet, fluticasone (FLONASE) 50 MCG/ACT nasal spray  Acute cystitis without hematuria - Plan: Urinalysis, Routine w reflex microscopic, Urine Culture  Prediabetes - Plan: Hemoglobin A1c  Tinnitus, unspecified laterality  Breast mass, right - Plan: MM DIAG BREAST TOMO BILATERAL  Abnormal  mammogram - Plan: MM DIAG BREAST TOMO BILATERAL  HM Declines vaccines will redisc Tdap in future  covid had 2/2   Out of age window pap had 10/04/16 normal no HPV no h/o abnormal  Colonoscopy had 07/23/13 Dr. Tiffany Kocher hyperplastic polyp repeat in 10 years mammo 10/2017 oil cyst right breast repeat dx mammo in 6 months -re ordered mammo 11/20/18 had 05/22/18 rec b/l diag in 6 months right breast cyst   DEXA 07/04/09 normal  Declines STD check, hep B Hep C neg 10/04/16  sch fasting labs  rec healthy diet choices and exercise  Provider: Dr. Olivia Mackie McLean-Scocuzza-[facility]

## 2019-09-17 ENCOUNTER — Encounter: Payer: Self-pay | Admitting: Internal Medicine

## 2019-09-23 ENCOUNTER — Other Ambulatory Visit: Payer: Self-pay

## 2019-09-23 ENCOUNTER — Other Ambulatory Visit (INDEPENDENT_AMBULATORY_CARE_PROVIDER_SITE_OTHER): Payer: PPO

## 2019-09-23 DIAGNOSIS — R7303 Prediabetes: Secondary | ICD-10-CM | POA: Diagnosis not present

## 2019-09-23 DIAGNOSIS — I10 Essential (primary) hypertension: Secondary | ICD-10-CM

## 2019-09-23 DIAGNOSIS — N3000 Acute cystitis without hematuria: Secondary | ICD-10-CM

## 2019-09-23 LAB — LIPID PANEL
Cholesterol: 147 mg/dL (ref 0–200)
HDL: 65.8 mg/dL (ref >39.00)
LDL Cholesterol: 70 mg/dL (ref 0–99)
NonHDL: 81.65
Total CHOL/HDL Ratio: 2
Triglycerides: 57 mg/dL (ref 0.0–149.0)
VLDL: 11.4 mg/dL (ref 0.0–40.0)

## 2019-09-23 LAB — COMPREHENSIVE METABOLIC PANEL
ALT: 15 U/L (ref 0–35)
AST: 19 U/L (ref 0–37)
Albumin: 4.2 g/dL (ref 3.5–5.2)
Alkaline Phosphatase: 71 U/L (ref 39–117)
BUN: 15 mg/dL (ref 6–23)
CO2: 28 mEq/L (ref 19–32)
Calcium: 9.2 mg/dL (ref 8.4–10.5)
Chloride: 105 mEq/L (ref 96–112)
Creatinine, Ser: 0.78 mg/dL (ref 0.40–1.20)
GFR: 88.9 mL/min (ref >60.00)
Glucose, Bld: 97 mg/dL (ref 70–99)
Potassium: 4.5 mEq/L (ref 3.5–5.1)
Sodium: 138 mEq/L (ref 135–145)
Total Bilirubin: 0.7 mg/dL (ref 0.2–1.2)
Total Protein: 6.9 g/dL (ref 6.0–8.3)

## 2019-09-23 LAB — CBC WITH DIFFERENTIAL/PLATELET
Basophils Absolute: 0 10*3/uL (ref 0.0–0.1)
Basophils Relative: 0.9 % (ref 0.0–3.0)
Eosinophils Absolute: 0.1 10*3/uL (ref 0.0–0.7)
Eosinophils Relative: 1.9 % (ref 0.0–5.0)
HCT: 39.2 % (ref 36.0–46.0)
Hemoglobin: 13.2 g/dL (ref 12.0–15.0)
Lymphocytes Relative: 44.7 % (ref 12.0–46.0)
Lymphs Abs: 1.3 10*3/uL (ref 0.7–4.0)
MCHC: 33.6 g/dL (ref 30.0–36.0)
MCV: 95.2 fl (ref 78.0–100.0)
Monocytes Absolute: 0.3 10*3/uL (ref 0.1–1.0)
Monocytes Relative: 9 % (ref 3.0–12.0)
Neutro Abs: 1.2 10*3/uL — ABNORMAL LOW (ref 1.4–7.7)
Neutrophils Relative %: 43.5 % (ref 43.0–77.0)
Platelets: 235 10*3/uL (ref 150.0–400.0)
RBC: 4.12 Mil/uL (ref 3.87–5.11)
RDW: 13.7 % (ref 11.5–15.5)
WBC: 2.8 10*3/uL — ABNORMAL LOW (ref 4.0–10.5)

## 2019-09-23 LAB — HEMOGLOBIN A1C: Hgb A1c MFr Bld: 6.2 % (ref 4.6–6.5)

## 2019-09-24 LAB — URINALYSIS, ROUTINE W REFLEX MICROSCOPIC
Bilirubin Urine: NEGATIVE
Glucose, UA: NEGATIVE
Hgb urine dipstick: NEGATIVE
Ketones, ur: NEGATIVE
Leukocytes,Ua: NEGATIVE
Nitrite: NEGATIVE
Protein, ur: NEGATIVE
Specific Gravity, Urine: 1.004 (ref 1.001–1.03)
pH: 7.5 (ref 5.0–8.0)

## 2019-09-24 LAB — URINE CULTURE
MICRO NUMBER:: 10549083
SPECIMEN QUALITY:: ADEQUATE

## 2019-09-28 ENCOUNTER — Other Ambulatory Visit: Payer: Self-pay

## 2019-09-30 ENCOUNTER — Other Ambulatory Visit: Payer: Self-pay

## 2019-09-30 ENCOUNTER — Ambulatory Visit (INDEPENDENT_AMBULATORY_CARE_PROVIDER_SITE_OTHER): Payer: PPO | Admitting: Internal Medicine

## 2019-09-30 ENCOUNTER — Encounter: Payer: Self-pay | Admitting: Internal Medicine

## 2019-09-30 VITALS — BP 126/84 | HR 121 | Temp 97.5°F | Ht <= 58 in | Wt 158.1 lb

## 2019-09-30 DIAGNOSIS — E559 Vitamin D deficiency, unspecified: Secondary | ICD-10-CM

## 2019-09-30 DIAGNOSIS — E610 Copper deficiency: Secondary | ICD-10-CM | POA: Diagnosis not present

## 2019-09-30 DIAGNOSIS — E538 Deficiency of other specified B group vitamins: Secondary | ICD-10-CM

## 2019-09-30 DIAGNOSIS — R7303 Prediabetes: Secondary | ICD-10-CM

## 2019-09-30 DIAGNOSIS — I10 Essential (primary) hypertension: Secondary | ICD-10-CM

## 2019-09-30 DIAGNOSIS — D72819 Decreased white blood cell count, unspecified: Secondary | ICD-10-CM | POA: Diagnosis not present

## 2019-09-30 DIAGNOSIS — R928 Other abnormal and inconclusive findings on diagnostic imaging of breast: Secondary | ICD-10-CM

## 2019-09-30 DIAGNOSIS — Z1329 Encounter for screening for other suspected endocrine disorder: Secondary | ICD-10-CM

## 2019-09-30 DIAGNOSIS — Z1389 Encounter for screening for other disorder: Secondary | ICD-10-CM

## 2019-09-30 LAB — FOLATE: Folate: >24.8 ng/mL (ref >5.9)

## 2019-09-30 LAB — VITAMIN B12: Vitamin B-12: 741 pg/mL (ref 211–911)

## 2019-09-30 NOTE — Patient Instructions (Addendum)
Ambi for dark spots  tendskin for ingrown hairs and irritation after shaving Alpha lipoic acid 600 mg 2x per day can try this if needed  Peripheral Neuropathy Peripheral neuropathy is a type of nerve damage. It affects nerves that carry signals between the spinal cord and the arms, legs, and the rest of the body (peripheral nerves). It does not affect nerves in the spinal cord or brain. In peripheral neuropathy, one nerve or a group of nerves may be damaged. Peripheral neuropathy is a broad category that includes many specific nerve disorders, like diabetic neuropathy, hereditary neuropathy, and carpal tunnel syndrome. What are the causes? This condition may be caused by:  Diabetes. This is the most common cause of peripheral neuropathy.  Nerve injury.  Pressure or stress on a nerve that lasts a long time.  Lack (deficiency) of B vitamins. This can result from alcoholism, poor diet, or a restricted diet.  Infections.  Autoimmune diseases, such as rheumatoid arthritis and systemic lupus erythematosus.  Nerve diseases that are passed from parent to child (inherited).  Some medicines, such as cancer medicines (chemotherapy).  Poisonous (toxic) substances, such as lead and mercury.  Too little blood flowing to the legs.  Kidney disease.  Thyroid disease. In some cases, the cause of this condition is not known. What are the signs or symptoms? Symptoms of this condition depend on which of your nerves is damaged. Common symptoms include:  Loss of feeling (numbness) in the feet, hands, or both.  Tingling in the feet, hands, or both.  Burning pain.  Very sensitive skin.  Weakness.  Not being able to move a part of the body (paralysis).  Muscle twitching.  Clumsiness or poor coordination.  Loss of balance.  Not being able to control your bladder.  Feeling dizzy.  Sexual problems. How is this diagnosed? Diagnosing and finding the cause of peripheral neuropathy can be  difficult. Your health care provider will take your medical history and do a physical exam. A neurological exam will also be done. This involves checking things that are affected by your brain, spinal cord, and nerves (nervous system). For example, your health care provider will check your reflexes, how you move, and what you can feel. You may have other tests, such as:  Blood tests.  Electromyogram (EMG) and nerve conduction tests. These tests check nerve function and how well the nerves are controlling the muscles.  Imaging tests, such as CT scans or MRI to rule out other causes of your symptoms.  Removing a small piece of nerve to be examined in a lab (nerve biopsy). This is rare.  Removing and examining a small amount of the fluid that surrounds the brain and spinal cord (lumbar puncture). This is rare. How is this treated? Treatment for this condition may involve:  Treating the underlying cause of the neuropathy, such as diabetes, kidney disease, or vitamin deficiencies.  Stopping medicines that can cause neuropathy, such as chemotherapy.  Medicine to relieve pain. Medicines may include: ? Prescription or over-the-counter pain medicine. ? Antiseizure medicine. ? Antidepressants. ? Pain-relieving patches that are applied to painful areas of skin.  Surgery to relieve pressure on a nerve or to destroy a nerve that is causing pain.  Physical therapy to help improve movement and balance.  Devices to help you move around (assistive devices). Follow these instructions at home: Medicines  Take over-the-counter and prescription medicines only as told by your health care provider. Do not take any other medicines without first asking your  health care provider.  Do not drive or use heavy machinery while taking prescription pain medicine. Lifestyle   Do not use any products that contain nicotine or tobacco, such as cigarettes and e-cigarettes. Smoking keeps blood from reaching damaged  nerves. If you need help quitting, ask your health care provider.  Avoid or limit alcohol. Too much alcohol can cause a vitamin B deficiency, and vitamin B is needed for healthy nerves.  Eat a healthy diet. This includes: ? Eating foods that are high in fiber, such as fresh fruits and vegetables, whole grains, and beans. ? Limiting foods that are high in fat and processed sugars, such as fried or sweet foods. General instructions   If you have diabetes, work closely with your health care provider to keep your blood sugar under control.  If you have numbness in your feet: ? Check every day for signs of injury or infection. Watch for redness, warmth, and swelling. ? Wear padded socks and comfortable shoes. These help protect your feet.  Develop a good support system. Living with peripheral neuropathy can be stressful. Consider talking with a mental health specialist or joining a support group.  Use assistive devices and attend physical therapy as told by your health care provider. This may include using a walker or a cane.  Keep all follow-up visits as told by your health care provider. This is important. Contact a health care provider if:  You have new signs or symptoms of peripheral neuropathy.  You are struggling emotionally from dealing with peripheral neuropathy.  Your pain is not well-controlled. Get help right away if:  You have an injury or infection that is not healing normally.  You develop new weakness in an arm or leg.  You fall frequently. Summary  Peripheral neuropathy is when the nerves in the arms, or legs are damaged, resulting in numbness, weakness, or pain.  There are many causes of peripheral neuropathy, including diabetes, pinched nerves, vitamin deficiencies, autoimmune disease, and hereditary conditions.  Diagnosing and finding the cause of peripheral neuropathy can be difficult. Your health care provider will take your medical history, do a physical  exam, and do tests, including blood tests and nerve function tests.  Treatment involves treating the underlying cause of the neuropathy and taking medicines to help control pain. Physical therapy and assistive devices may also help. This information is not intended to replace advice given to you by your health care provider. Make sure you discuss any questions you have with your health care provider. Document Revised: 03/21/2017 Document Reviewed: 06/17/2016 Elsevier Patient Education  2020 [employer].    Prediabetes Eating Plan Prediabetes is a condition that causes blood sugar (glucose) levels to be higher than normal. This increases the risk for developing diabetes. In order to prevent diabetes from developing, your health care provider may recommend a diet and other lifestyle changes to help you:  Control your blood glucose levels.  Improve your cholesterol levels.  Manage your blood pressure. Your health care provider may recommend working with a diet and nutrition specialist (dietitian) to make a meal plan that is best for you. What are tips for following this plan? Lifestyle  Set weight loss goals with the help of your health care team. It is recommended that most people with prediabetes lose 7% of their current body weight.  Exercise for at least 30 minutes at least 5 days a week.  Attend a support group or seek ongoing support from a mental health counselor.  Take over-the-counter  and prescription medicines only as told by your health care provider. Reading food labels  Read food labels to check the amount of fat, salt (sodium), and sugar in prepackaged foods. Avoid foods that have: ? Saturated fats. ? Trans fats. ? Added sugars.  Avoid foods that have more than 300 milligrams (mg) of sodium per serving. Limit your daily sodium intake to less than 2,300 mg each day. Shopping  Avoid buying pre-made and processed foods. Cooking  Cook with olive oil. Do not use  butter, lard, or ghee.  Bake, broil, grill, or boil foods. Avoid frying. Meal planning   Work with your dietitian to develop an eating plan that is right for you. This may include: ? Tracking how many calories you take in. Use a food diary, notebook, or mobile application to track what you eat at each meal. ? Using the glycemic index (GI) to plan your meals. The index tells you how quickly a food will raise your blood glucose. Choose low-GI foods. These foods take a longer time to raise blood glucose.  Consider following a Mediterranean diet. This diet includes: ? Several servings each day of fresh fruits and vegetables. ? Eating fish at least twice a week. ? Several servings each day of whole grains, beans, nuts, and seeds. ? Using olive oil instead of other fats. ? Moderate alcohol consumption. ? Eating small amounts of red meat and whole-fat dairy.  If you have high blood pressure, you may need to limit your sodium intake or follow a diet such as the DASH eating plan. DASH is an eating plan that aims to lower high blood pressure. What foods are recommended? The items listed below may not be a complete list. Talk with your dietitian about what dietary choices are best for you. Grains Whole grains, such as whole-wheat or whole-grain breads, crackers, cereals, and pasta. Unsweetened oatmeal. Bulgur. Barley. Quinoa. Brown rice. Corn or whole-wheat flour tortillas or taco shells. Vegetables Lettuce. Spinach. Peas. Beets. Cauliflower. Cabbage. Broccoli. Carrots. Tomatoes. Squash. Eggplant. Herbs. Peppers. Onions. Cucumbers. Brussels sprouts. Fruits Berries. Bananas. Apples. Oranges. Grapes. Papaya. Mango. Pomegranate. Kiwi. Grapefruit. Cherries. Meats and other protein foods Seafood. Poultry without skin. Lean cuts of pork and beef. Tofu. Eggs. Nuts. Beans. Dairy Low-fat or fat-free dairy products, such as yogurt, cottage cheese, and cheese. Beverages Water. Tea. Coffee. Sugar-free or  diet soda. Seltzer water. Lowfat or no-fat milk. Milk alternatives, such as soy or almond milk. Fats and oils Olive oil. Canola oil. Sunflower oil. Grapeseed oil. Avocado. Walnuts. Sweets and desserts Sugar-free or low-fat pudding. Sugar-free or low-fat ice cream and other frozen treats. Seasoning and other foods Herbs. Sodium-free spices. Mustard. Relish. Low-fat, low-sugar ketchup. Low-fat, low-sugar barbecue sauce. Low-fat or fat-free mayonnaise. What foods are not recommended? The items listed below may not be a complete list. Talk with your dietitian about what dietary choices are best for you. Grains Refined white flour and flour products, such as bread, pasta, snack foods, and cereals. Vegetables Canned vegetables. Frozen vegetables with butter or cream sauce. Fruits Fruits canned with syrup. Meats and other protein foods Fatty cuts of meat. Poultry with skin. Breaded or fried meat. Processed meats. Dairy Full-fat yogurt, cheese, or milk. Beverages Sweetened drinks, such as sweet iced tea and soda. Fats and oils Butter. Lard. Ghee. Sweets and desserts Baked goods, such as cake, cupcakes, pastries, cookies, and cheesecake. Seasoning and other foods Spice mixes with added salt. Ketchup. Barbecue sauce. Mayonnaise. Summary  To prevent diabetes from developing, you may  need to make diet and other lifestyle changes to help control blood sugar, improve cholesterol levels, and manage your blood pressure.  Set weight loss goals with the help of your health care team. It is recommended that most people with prediabetes lose 7 percent of their current body weight.  Consider following a Mediterranean diet that includes plenty of fresh fruits and vegetables, whole grains, beans, nuts, seeds, fish, lean meat, low-fat dairy, and healthy oils. This information is not intended to replace advice given to you by your health care provider. Make sure you discuss any questions you have with your  health care provider. Document Revised: 07/31/2018 Document Reviewed: 06/12/2016 Elsevier Patient Education  2020 [employer].

## 2019-09-30 NOTE — Progress Notes (Signed)
Chief Complaint  Patient presents with  . Annual Exam   F/u  1. HTN controlled on losartan 100 mg qd 2. Prediabetes A1C 6.2 will start exercise program  3. Rash underarms improved but dark spots using otc medication to fade  4. Leukopenia, chronic declines h/o but agreeable folic acid, N82 and copper today denies fatigue, wt loss, night sweats   Review of Systems  Constitutional: Negative for weight loss.  HENT: Negative for hearing loss.   Eyes: Negative for blurred vision.  Respiratory: Negative for shortness of breath.   Cardiovascular: Negative for chest pain.  Gastrointestinal: Negative for abdominal pain.  Musculoskeletal: Negative for falls.  Skin: Negative for rash.  Neurological: Negative for headaches.  Psychiatric/Behavioral: Negative for depression.   Past Medical History:  Diagnosis Date  . Hypertension   . Prediabetes    Past Surgical History:  Procedure Laterality Date  . CESAREAN SECTION    . FEMUR SURGERY     left, s/p rod for fracture  . TUBAL LIGATION     Family History  Problem Relation Age of Onset  . Diabetes Mother   . Hypertension Mother   . Heart disease Mother   . Cancer Mother        breast and stomach  . Breast cancer Mother 15  . Diabetes Father   . Hypertension Father   . Heart disease Father   . Kidney disease Father    Social History   Socioeconomic History  . Marital status: Married    Spouse name: Not on file  . Number of children: Not on file  . Years of education: Not on file  . Highest education level: Not on file  Occupational History  . Not on file  Tobacco Use  . Smoking status: Never Smoker  . Smokeless tobacco: Never Used  Substance and Sexual Activity  . Alcohol use: No  . Drug use: Not on file  . Sexual activity: Not on file  Other Topics Concern  . Not on file  Social History Narrative   Lives in [place] with husband. No pets   2 daughters and 3 granddaughters      Work - [employer] retired  09/2019   Diet - regular diet   Exercise - none   Social Determinants of Health   Financial Resource Strain:   . Difficulty of Paying Living Expenses:   Food Insecurity:   . Worried About Charity fundraiser in the Last Year:   . Arboriculturist in the Last Year:   Transportation Needs:   . Film/video editor (Medical):   [name] Lack of Transportation (Non-Medical):   Physical Activity:   . Days of Exercise per Week:   . Minutes of Exercise per Session:   Stress:   . Feeling of Stress :   Social Connections:   . Frequency of Communication with Friends and Family:   . Frequency of Social Gatherings with Friends and Family:   . Attends Religious Services:   . Active Member of Clubs or Organizations:   . Attends Archivist Meetings:   [name] Marital Status:   Intimate Partner Violence:   . Fear of Current or Ex-Partner:   . Emotionally Abused:   [name] Physically Abused:   . Sexually Abused:    Current Meds  Medication Sig  . Chromium Picolinate (CHROMIUM PICOLATE PO) Take by mouth daily.  . fluticasone (FLONASE) 50 MCG/ACT nasal spray USE TWO SPRAYS IN EACH NOSTRIL ONCE DAILY  prn  . loratadine (CLARITIN) 10 MG tablet Take 1 tablet (10 mg total) by mouth daily as needed for allergies.  [name] losartan (COZAAR) 100 MG tablet Take 1 tablet (100 mg total) by mouth daily.  . Multiple Vitamin (ONE-DAILY MULTI-VITAMIN PO) Take by mouth daily.  [name] VITAMIN D PO Take by mouth daily.   Allergies  Allergen Reactions  . Diclofenac Hives  . Lisinopril     cough  . Sulfa Antibiotics     ? Allergy    Recent Results (from the past 2160 hour(s))  Urine Culture     Status: None   Collection Time: 09/23/19  9:46 AM   Specimen: Urine  Result Value Ref Range   MICRO NUMBER: [id number]    SPECIMEN QUALITY: Adequate    Sample Source NOT GIVEN    STATUS: FINAL    ISOLATE 1:      Growth of mixed flora was isolated, suggesting probable contamination. No further testing will be performed. If  clinically indicated, recollection using a method to minimize contamination, with prompt transfer to Urine Culture Transport Tube, is  recommended.   Urinalysis, Routine w reflex microscopic     Status: None   Collection Time: 09/23/19  9:46 AM  Result Value Ref Range   Color, Urine YELLOW YELLOW   APPearance CLEAR CLEAR   Specific Gravity, Urine 1.004 1.001 - 1.03   pH 7.5 5.0 - 8.0   Glucose, UA NEGATIVE NEGATIVE   Bilirubin Urine NEGATIVE NEGATIVE   Ketones, ur NEGATIVE NEGATIVE   Hgb urine dipstick NEGATIVE NEGATIVE   Protein, ur NEGATIVE NEGATIVE   Nitrite NEGATIVE NEGATIVE   Leukocytes,Ua NEGATIVE NEGATIVE  Hemoglobin A1c     Status: None   Collection Time: 09/23/19  9:46 AM  Result Value Ref Range   Hgb A1c MFr Bld 6.2 4.6 - 6.5 %    Comment: Glycemic Control Guidelines for People with Diabetes:Non Diabetic:  <6%Goal of Therapy: <7%Additional Action Suggested:  >8%   CBC w/Diff     Status: Abnormal   Collection Time: 09/23/19  9:46 AM  Result Value Ref Range   WBC 2.8 (L) 4.0 - 10.5 K/uL   RBC 4.12 3.87 - 5.11 Mil/uL   Hemoglobin 13.2 12.0 - 15.0 g/dL   HCT 39.2 36 - 46 %   MCV 95.2 78.0 - 100.0 fl   MCHC 33.6 30.0 - 36.0 g/dL   RDW 13.7 11.5 - 15.5 %   Platelets 235.0 150 - 400 K/uL   Neutrophils Relative % 43.5 43 - 77 %   Lymphocytes Relative 44.7 12 - 46 %   Monocytes Relative 9.0 3 - 12 %   Eosinophils Relative 1.9 0 - 5 %   Basophils Relative 0.9 0 - 3 %   Neutro Abs 1.2 (L) 1.4 - 7.7 K/uL   Lymphs Abs 1.3 0.7 - 4.0 K/uL   Monocytes Absolute 0.3 0 - 1 K/uL   Eosinophils Absolute 0.1 0 - 0 K/uL   Basophils Absolute 0.0 0 - 0 K/uL  Lipid panel     Status: None   Collection Time: 09/23/19  9:46 AM  Result Value Ref Range   Cholesterol 147 0 - 200 mg/dL    Comment: ATP III Classification       Desirable:  < 200 mg/dL               Borderline High:  200 - 239 mg/dL          High:  > =  240 mg/dL   Triglycerides 57.0 0 - 149 mg/dL    Comment: Normal:  <150  mg/dLBorderline High:  150 - 199 mg/dL   HDL 65.80 >39.00 mg/dL   VLDL 11.4 0.0 - 40.0 mg/dL   LDL Cholesterol 70 0 - 99 mg/dL   Total CHOL/HDL Ratio 2     Comment:                Men          Women1/2 Average Risk     3.4          3.3Average Risk          5.0          4.42X Average Risk          9.6          7.13X Average Risk          15.0          11.0                       NonHDL 81.65     Comment: NOTE:  Non-HDL goal should be 30 mg/dL higher than patient's LDL goal (i.e. LDL goal of < 70 mg/dL, would have non-HDL goal of < 100 mg/dL)  Comprehensive metabolic panel     Status: None   Collection Time: 09/23/19  9:46 AM  Result Value Ref Range   Sodium 138 135 - 145 mEq/L   Potassium 4.5 3.5 - 5.1 mEq/L   Chloride 105 96 - 112 mEq/L   CO2 28 19 - 32 mEq/L   Glucose, Bld 97 70 - 99 mg/dL   BUN 15 6 - 23 mg/dL   Creatinine, Ser 0.78 0.40 - 1.20 mg/dL   Total Bilirubin 0.7 0.2 - 1.2 mg/dL   Alkaline Phosphatase 71 39 - 117 U/L   AST 19 0 - 37 U/L   ALT 15 0 - 35 U/L   Total Protein 6.9 6.0 - 8.3 g/dL   Albumin 4.2 3.5 - 5.2 g/dL   GFR 88.90 >60.00 mL/min   Calcium 9.2 8.4 - 10.5 mg/dL   Objective  Body mass index is 33.19 kg/m. Wt Readings from Last 3 Encounters:  09/30/19 158 lb 1.9 oz (71.7 kg)  07/21/19 162 lb 6.4 oz (73.7 kg)  03/10/18 154 lb (69.9 kg)   Temp Readings from Last 3 Encounters:  09/30/19 (!) 97.5 F (36.4 C) (Temporal)  07/21/19 97.7 F (36.5 C) (Temporal)  03/10/18 97.6 F (36.4 C) (Oral)   BP Readings from Last 3 Encounters:  09/30/19 126/84  07/21/19 130/78  03/10/18 130/88   Pulse Readings from Last 3 Encounters:  09/30/19 (!) 121  07/21/19 84  03/10/18 97    Physical Exam Vitals and nursing note reviewed.  Constitutional:      Appearance: Normal appearance. She is well-developed and well-groomed.  HENT:     Head: Normocephalic and atraumatic.  Eyes:     Conjunctiva/sclera: Conjunctivae normal.     Pupils: Pupils are equal, round,  and reactive to light.  Cardiovascular:     Rate and Rhythm: Normal rate and regular rhythm.     Heart sounds: Normal heart sounds. No murmur heard.   Pulmonary:     Effort: Pulmonary effort is normal.     Breath sounds: Normal breath sounds.  Skin:    General: Skin is warm and dry.  Neurological:     General: No focal deficit present.  Mental Status: She is alert and oriented to person, place, and time. Mental status is at baseline.     Gait: Gait normal.  Psychiatric:        Attention and Perception: Attention and perception normal.        Mood and Affect: Mood and affect normal.        Speech: Speech normal.        Behavior: Behavior normal. Behavior is cooperative.        Thought Content: Thought content normal.        Cognition and Memory: Cognition and memory normal.        Judgment: Judgment normal.     Assessment  Plan  Essential hypertension Cont meds   Leukopenia, unspecified type - Plan: B12, Folate, Copper, Serum Declines h/o for now  Abnormal mammogram - Plan: MM DIAG BREAST TOMO BILATERAL due 11/05/19   Prediabetes  rec exercise and healthy diet   HM Declines vaccines  will redisc Tdap in future  covid had 2/2   Out of age window pap had 10/04/16 normal no HPV no h/o abnormal  Colonoscopy had 07/23/13 Dr. Tiffany Kocher hyperplastic polyp repeat in 10 years mammo 10/2017 oil cyst right breast repeat dx mammo in 6 months -re ordered mammo 11/20/18 had 05/22/18 rec b/l diag in 6 months right breast cyst -ordered mammo pt to call and schedule  DEXA 07/04/09 normal  Declines STD check, hep B Hep C neg 10/04/16  sch fasting labs  rec healthy diet choices and exercise  Provider: Dr. Olivia Mackie McLean-Scocuz[facility]

## 2019-10-02 LAB — COPPER, SERUM: Copper: 143 ug/dL (ref 70–175)

## 2019-11-09 ENCOUNTER — Ambulatory Visit
Admission: RE | Admit: 2019-11-09 | Discharge: 2019-11-09 | Disposition: A | Discharge status: Home / Self Care | Payer: PPO | Source: Ambulatory Visit | Attending: Internal Medicine | Admitting: Internal Medicine

## 2019-11-09 DIAGNOSIS — R928 Other abnormal and inconclusive findings on diagnostic imaging of breast: Secondary | ICD-10-CM

## 2019-12-01 DIAGNOSIS — H2513 Age-related nuclear cataract, bilateral: Secondary | ICD-10-CM | POA: Diagnosis not present

## 2020-01-17 ENCOUNTER — Telehealth (INDEPENDENT_AMBULATORY_CARE_PROVIDER_SITE_OTHER): Payer: PPO | Admitting: Nurse Practitioner

## 2020-01-17 ENCOUNTER — Encounter: Payer: Self-pay | Admitting: Nurse Practitioner

## 2020-01-17 VITALS — BP 141/82 | HR 88 | Ht <= 58 in | Wt 155.0 lb

## 2020-01-17 DIAGNOSIS — J309 Allergic rhinitis, unspecified: Secondary | ICD-10-CM

## 2020-01-17 NOTE — Progress Notes (Signed)
Virtual Visit via Virtual Note  This visit type was conducted due to national recommendations for restrictions regarding the COVID-19 pandemic (e.g. social distancing).  This format is felt to be most appropriate for this patient at this time.  All issues noted in this document were discussed and addressed.  No physical exam was performed (except for noted visual exam findings with Video Visits).   I connected with@ on 01/17/20 at  2:00 PM EDT by a video enabled telemedicine application or telephone and verified that I am speaking with the correct person using two identifiers. Location patient: home Location provider: work or home office Persons participating in the virtual visit: patient, provider  I discussed the limitations, risks, security and privacy concerns of performing an evaluation and management service by telephone and the availability of in person appointments. I also discussed with the patient that there may be a patient responsible charge related to this service. The patient expressed understanding and agreed to proceed.  Reason for visit: Patient had a runny nose, dry mouth, and burning sensation in the top of her mouth.  She started using Flonase is feeling better.  HPI: Patient reports she just had a runny nose and just a little congestion started about 2 days ago.  Her mouth was dry her eyes were itchy.  She has been drinking a lot of water, tried a little Claritin, nasal spray, and she is feeling much better now.  She does not think she has a sinus infection. This feels like her allergies. No fevers or chills headache facial pressure sore throat.  She has had 2 negative  Covid tests.  She is vaccinated with Moderna in  March. She feels like her house is dry and has not tried her humidifier.    ROS: See pertinent positives and negatives per HPI.  Past Medical History:  Diagnosis Date  . Hypertension   . Prediabetes     Past Surgical History:  Procedure Laterality Date  .  CESAREAN SECTION    . FEMUR SURGERY     left, s/p rod for fracture  . TUBAL LIGATION      Family History  Problem Relation Age of Onset  . Diabetes Mother   . Hypertension Mother   . Heart disease Mother   . Cancer Mother        breast and stomach  . Breast cancer Mother 8  . Diabetes Father   . Hypertension Father   . Heart disease Father   . Kidney disease Father     SOCIAL HX: Never smoked.   Current Outpatient Medications:  .  Chromium Picolinate (CHROMIUM PICOLATE PO), Take by mouth daily., Disp: , Rfl:  .  fluticasone (FLONASE) 50 MCG/ACT nasal spray, USE TWO SPRAYS IN EACH NOSTRIL ONCE DAILY prn, Disp: 16 g, Rfl: 12 .  loratadine (CLARITIN) 10 MG tablet, Take 1 tablet (10 mg total) by mouth daily as needed for allergies., Disp: 90 tablet, Rfl: 3 .  losartan (COZAAR) 100 MG tablet, Take 1 tablet (100 mg total) by mouth daily., Disp: 90 tablet, Rfl: 3 .  Multiple Vitamin (ONE-DAILY MULTI-VITAMIN PO), Take by mouth daily., Disp: , Rfl:  .  Omega-3 Fatty Acids (FISH OIL) 1000 MG CAPS, Take by mouth., Disp: , Rfl:  .  VITAMIN D PO, Take by mouth daily., Disp: , Rfl:   EXAM:  VITALS per patient if applicable:  GENERAL: alert, oriented, appears well and in no acute distress  HEENT: atraumatic, conjunctiva clear, no obvious abnormalities  on inspection of external nose and ears  NECK: normal movements of the head and neck  LUNGS: on inspection no signs of respiratory distress, breathing rate appears normal, no obvious gross SOB, gasping or wheezing  CV: no obvious cyanosis  MS: moves all visible extremities without noticeable abnormality  PSYCH/NEURO: pleasant and cooperative, no obvious depression or anxiety, speech and thought processing grossly intact  ASSESSMENT AND PLAN:  Discussed the following assessment and plan:  No diagnosis found.  No problem-specific Assessment & Plan notes found for this encounter.   Please begin taking your Claritin on a daily  basis. You may continue with your Flonase twice daily.  You have itchy eyes, and allergy symptoms and these routine allergy medicines taken on a regular basis  may help.  Use your humidification at home.    You may use simply saline nasal spray per bottle directions.  You do not have symptoms of a sinus infection at this time, and fortunately two Covid tests have returned negative. Please continue to treat for allergies as you are doing and monitor for resolution.  Call back if symptoms do not resolve or if you develop fevers, chills, headache, facial pressure, purulent yellow-green discharge, cough, or simply start to feel worse over time. I discussed the assessment and treatment plan with the patient. The patient was provided an opportunity to ask questions and all were answered. The patient agreed with the plan and demonstrated an understanding of the instructions.   The patient was advised to call back or seek an in-person evaluation if the symptoms worsen or if the condition fails to improve as anticipated.  Denice Paradise, NP Adult Nurse Practitioner [place] [phone]

## 2020-01-17 NOTE — Patient Instructions (Addendum)
Please begin taking your Claritin on a daily basis. You may continue with your Flonase twice daily.  You have itchy eyes, and allergy symptoms and these routine allergy medicines taken on a regular basis  may help.  Use your humidification at home.    You may use simply saline nasal spray per bottle directions.  You do not have symptoms of a sinus infection at this time, and fortunately two Covid tests have returned negative. Please continue to treat for allergies as you are doing and monitor for resolution.  Call back if symptoms do not resolve or if you develop fevers, chills, headache, facial pressure, purulent yellow-green discharge, cough, or simply start to feel worse over time.    Allergic Rhinitis, Adult Allergic rhinitis is an allergic reaction that affects the mucous membrane inside the nose. It causes sneezing, a runny or stuffy nose, and the feeling of mucus going down the back of the throat (postnasal drip). Allergic rhinitis can be mild to severe. There are two types of allergic rhinitis:  Seasonal. This type is also called hay fever. It happens only during certain seasons.  Perennial. This type can happen at any time of the year. What are the causes? This condition happens when the body's defense system (immune system) responds to certain harmless substances called allergens as though they were germs.  Seasonal allergic rhinitis is triggered by pollen, which can come from grasses, trees, and weeds. Perennial allergic rhinitis may be caused by:  House dust mites.  Pet dander.  Mold spores. What are the signs or symptoms? Symptoms of this condition include:  Sneezing.  Runny or stuffy nose (nasal congestion).  Postnasal drip.  Itchy nose.  Tearing of the eyes.  Trouble sleeping.  Daytime sleepiness. How is this diagnosed? This condition may be diagnosed based on:  Your medical history.  A physical exam.  Tests to check for related conditions, such  as: ? Asthma. ? Pink eye. ? Ear infection. ? Upper respiratory infection.  Tests to find out which allergens trigger your symptoms. These may include skin or blood tests. How is this treated? There is no cure for this condition, but treatment can help control symptoms. Treatment may include:  Taking medicines that block allergy symptoms, such as antihistamines. Medicine may be given as a shot, nasal spray, or pill.  Avoiding the allergen.  Desensitization. This treatment involves getting ongoing shots until your body becomes less sensitive to the allergen. This treatment may be done if other treatments do not help.  If taking medicine and avoiding the allergen does not work, new, stronger medicines may be prescribed. Follow these instructions at home:  Find out what you are allergic to. Common allergens include smoke, dust, and pollen.  Avoid the things you are allergic to. These are some things you can do to help avoid allergens: ? Replace carpet with wood, tile, or vinyl flooring. Carpet can trap dander and dust. ? Do not smoke. Do not allow smoking in your home. ? Change your heating and air conditioning filter at least once a month. ? During allergy season:  Keep windows closed as much as possible.  Plan outdoor activities when pollen counts are lowest. This is usually during the evening hours.  When coming indoors, change clothing and shower before sitting on furniture or bedding.  Take over-the-counter and prescription medicines only as told by your health care provider.  Keep all follow-up visits as told by your health care provider. This is important. Contact a health  care provider if:  You have a fever.  You develop a persistent cough.  You make whistling sounds when you breathe (you wheeze).  Your symptoms interfere with your normal daily activities. Get help right away if:  You have shortness of breath. Summary  This condition can be managed by taking  medicines as directed and avoiding allergens.  Contact your health care provider if you develop a persistent cough or fever.  During allergy season, keep windows closed as much as possible. This information is not intended to replace advice given to you by your health care provider. Make sure you discuss any questions you have with your health care provider. Document Revised: 03/21/2017 Document Reviewed: 05/16/2016 Elsevier Patient Education  2020 [employer].

## 2020-02-28 ENCOUNTER — Other Ambulatory Visit: Payer: Self-pay

## 2020-02-28 ENCOUNTER — Ambulatory Visit (INDEPENDENT_AMBULATORY_CARE_PROVIDER_SITE_OTHER): Payer: PPO

## 2020-02-28 ENCOUNTER — Ambulatory Visit: Payer: PPO | Admitting: Podiatry

## 2020-02-28 ENCOUNTER — Encounter: Payer: Self-pay | Admitting: Podiatry

## 2020-02-28 DIAGNOSIS — M722 Plantar fascial fibromatosis: Secondary | ICD-10-CM

## 2020-02-28 NOTE — Patient Instructions (Signed)

## 2020-02-28 NOTE — Progress Notes (Signed)
She presents today concerned about pain that she is experiencing during the daytime primarily and numbness and tingling beneath the second metatarsophalangeal joint left.  She is also started to feel some pain in her bilateral heels right greater than left and states that that is only been going on intermittently over the past 4 to 5 months she really does not have any a.m. pain no pain with more activity.  Objective: Vital signs are stable alert and oriented x3.  Pulses are palpable.  There is no erythema edema cellulitis drainage or odor still has some tenderness on palpation of the second metatarsophalangeal joint.  She has pain on palpation bilateral heels.  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  Also she has plantar fasciitis.  Plan: I injected the right heel today 20 mg Kenalog 5 mg of Marcaine.  Provided her with information regarding physical therapy and a prescription for that.  I also provided her with plantar fascial braces.  Would like to follow-up with her when she is completed physical therapy.

## 2020-03-09 DIAGNOSIS — H2513 Age-related nuclear cataract, bilateral: Secondary | ICD-10-CM | POA: Diagnosis not present

## 2020-03-27 DIAGNOSIS — R2689 Other abnormalities of gait and mobility: Secondary | ICD-10-CM | POA: Diagnosis not present

## 2020-03-27 DIAGNOSIS — M25572 Pain in left ankle and joints of left foot: Secondary | ICD-10-CM | POA: Diagnosis not present

## 2020-03-27 DIAGNOSIS — M25571 Pain in right ankle and joints of right foot: Secondary | ICD-10-CM | POA: Diagnosis not present

## 2020-03-31 DIAGNOSIS — M25571 Pain in right ankle and joints of right foot: Secondary | ICD-10-CM | POA: Diagnosis not present

## 2020-03-31 DIAGNOSIS — M25572 Pain in left ankle and joints of left foot: Secondary | ICD-10-CM | POA: Diagnosis not present

## 2020-03-31 DIAGNOSIS — R2689 Other abnormalities of gait and mobility: Secondary | ICD-10-CM | POA: Diagnosis not present

## 2020-04-04 DIAGNOSIS — M25571 Pain in right ankle and joints of right foot: Secondary | ICD-10-CM | POA: Diagnosis not present

## 2020-04-04 DIAGNOSIS — R2689 Other abnormalities of gait and mobility: Secondary | ICD-10-CM | POA: Diagnosis not present

## 2020-04-04 DIAGNOSIS — M25572 Pain in left ankle and joints of left foot: Secondary | ICD-10-CM | POA: Diagnosis not present

## 2020-04-07 DIAGNOSIS — R2689 Other abnormalities of gait and mobility: Secondary | ICD-10-CM | POA: Diagnosis not present

## 2020-04-07 DIAGNOSIS — M25572 Pain in left ankle and joints of left foot: Secondary | ICD-10-CM | POA: Diagnosis not present

## 2020-04-07 DIAGNOSIS — M25571 Pain in right ankle and joints of right foot: Secondary | ICD-10-CM | POA: Diagnosis not present

## 2020-04-10 ENCOUNTER — Other Ambulatory Visit: Payer: Self-pay

## 2020-04-10 ENCOUNTER — Ambulatory Visit (INDEPENDENT_AMBULATORY_CARE_PROVIDER_SITE_OTHER): Payer: PPO | Admitting: Podiatry

## 2020-04-10 ENCOUNTER — Encounter: Payer: Self-pay | Admitting: Podiatry

## 2020-04-10 DIAGNOSIS — M778 Other enthesopathies, not elsewhere classified: Secondary | ICD-10-CM

## 2020-04-10 DIAGNOSIS — M722 Plantar fascial fibromatosis: Secondary | ICD-10-CM

## 2020-04-10 DIAGNOSIS — M2170 Unequal limb length (acquired), unspecified site: Secondary | ICD-10-CM | POA: Diagnosis not present

## 2020-04-10 NOTE — Progress Notes (Signed)
She presents today for follow-up of her capsulitis bilaterally.  She states that it does not really hurt anymore but still have a pulling sensation that seems to be improving.  Objective: Vital signs are stable alert oriented x3.  Pulses are palpable.  There is no erythema edema cellulitis drainage or odor.  Assessment: Resolving neuritis and capsulitis.  Plan: Follow-up with me on an as-needed basis.  We discussed appropriate shoe gear stretching exercise ice therapy and shoe gear modifications.

## 2020-04-11 DIAGNOSIS — M25571 Pain in right ankle and joints of right foot: Secondary | ICD-10-CM | POA: Diagnosis not present

## 2020-04-11 DIAGNOSIS — R2689 Other abnormalities of gait and mobility: Secondary | ICD-10-CM | POA: Diagnosis not present

## 2020-04-11 DIAGNOSIS — M25572 Pain in left ankle and joints of left foot: Secondary | ICD-10-CM | POA: Diagnosis not present

## 2020-04-17 DIAGNOSIS — M25571 Pain in right ankle and joints of right foot: Secondary | ICD-10-CM | POA: Diagnosis not present

## 2020-04-17 DIAGNOSIS — R2689 Other abnormalities of gait and mobility: Secondary | ICD-10-CM | POA: Diagnosis not present

## 2020-04-17 DIAGNOSIS — M25572 Pain in left ankle and joints of left foot: Secondary | ICD-10-CM | POA: Diagnosis not present

## 2020-04-20 DIAGNOSIS — R2689 Other abnormalities of gait and mobility: Secondary | ICD-10-CM | POA: Diagnosis not present

## 2020-04-20 DIAGNOSIS — M25572 Pain in left ankle and joints of left foot: Secondary | ICD-10-CM | POA: Diagnosis not present

## 2020-04-20 DIAGNOSIS — M25571 Pain in right ankle and joints of right foot: Secondary | ICD-10-CM | POA: Diagnosis not present

## 2020-04-28 ENCOUNTER — Other Ambulatory Visit: Payer: Self-pay

## 2020-04-28 DIAGNOSIS — J309 Allergic rhinitis, unspecified: Secondary | ICD-10-CM

## 2020-04-28 MED ORDER — LORATADINE 10 MG PO TABS: 10.0000 mg | ORAL_TABLET | Freq: Every day | ORAL | 1 refills | Status: DC | PRN

## 2020-08-20 ENCOUNTER — Other Ambulatory Visit: Payer: Self-pay | Admitting: Internal Medicine

## 2020-08-20 DIAGNOSIS — I10 Essential (primary) hypertension: Secondary | ICD-10-CM

## 2020-09-27 ENCOUNTER — Telehealth: Payer: Self-pay

## 2020-09-27 NOTE — Telephone Encounter (Signed)
Patient's last labs were a year ago.   Which orders would you like placed? I can place the orders and then call Patient to schedule

## 2020-09-27 NOTE — Telephone Encounter (Signed)
Pt would like labs prior to her appt on 10/03/20

## 2020-09-28 NOTE — Addendum Note (Signed)
Addended by: Orland Mustard on: 09/28/2020 05:24 PM   Modules accepted: Orders

## 2020-09-28 NOTE — Telephone Encounter (Signed)
Please sch fasting labs before appt 10/03/20

## 2020-09-29 NOTE — Telephone Encounter (Signed)
Per chart fasting labs have been scheduled.

## 2020-10-02 ENCOUNTER — Other Ambulatory Visit (INDEPENDENT_AMBULATORY_CARE_PROVIDER_SITE_OTHER): Payer: PPO

## 2020-10-02 ENCOUNTER — Other Ambulatory Visit: Payer: Self-pay

## 2020-10-02 DIAGNOSIS — D72819 Decreased white blood cell count, unspecified: Secondary | ICD-10-CM

## 2020-10-02 DIAGNOSIS — Z1389 Encounter for screening for other disorder: Secondary | ICD-10-CM | POA: Diagnosis not present

## 2020-10-02 DIAGNOSIS — I10 Essential (primary) hypertension: Secondary | ICD-10-CM | POA: Diagnosis not present

## 2020-10-02 DIAGNOSIS — Z1329 Encounter for screening for other suspected endocrine disorder: Secondary | ICD-10-CM

## 2020-10-02 DIAGNOSIS — R7303 Prediabetes: Secondary | ICD-10-CM

## 2020-10-02 DIAGNOSIS — E559 Vitamin D deficiency, unspecified: Secondary | ICD-10-CM

## 2020-10-02 LAB — COMPREHENSIVE METABOLIC PANEL
ALT: 14 U/L (ref 0–35)
AST: 19 U/L (ref 0–37)
Albumin: 4.4 g/dL (ref 3.5–5.2)
Alkaline Phosphatase: 78 U/L (ref 39–117)
BUN: 14 mg/dL (ref 6–23)
CO2: 26 mEq/L (ref 19–32)
Calcium: 9.4 mg/dL (ref 8.4–10.5)
Chloride: 105 mEq/L (ref 96–112)
Creatinine, Ser: 0.82 mg/dL (ref 0.40–1.20)
GFR: 73.24 mL/min (ref >60.00)
Glucose, Bld: 94 mg/dL (ref 70–99)
Potassium: 3.6 mEq/L (ref 3.5–5.1)
Sodium: 140 mEq/L (ref 135–145)
Total Bilirubin: 0.9 mg/dL (ref 0.2–1.2)
Total Protein: 7.2 g/dL (ref 6.0–8.3)

## 2020-10-02 LAB — CBC WITH DIFFERENTIAL/PLATELET
Basophils Absolute: 0 10*3/uL (ref 0.0–0.1)
Basophils Relative: 0.8 % (ref 0.0–3.0)
Eosinophils Absolute: 0 10*3/uL (ref 0.0–0.7)
Eosinophils Relative: 1.3 % (ref 0.0–5.0)
HCT: 40.8 % (ref 36.0–46.0)
Hemoglobin: 13.5 g/dL (ref 12.0–15.0)
Lymphocytes Relative: 41.3 % (ref 12.0–46.0)
Lymphs Abs: 1.4 10*3/uL (ref 0.7–4.0)
MCHC: 33 g/dL (ref 30.0–36.0)
MCV: 95.6 fl (ref 78.0–100.0)
Monocytes Absolute: 0.2 10*3/uL (ref 0.1–1.0)
Monocytes Relative: 6 % (ref 3.0–12.0)
Neutro Abs: 1.7 10*3/uL (ref 1.4–7.7)
Neutrophils Relative %: 50.6 % (ref 43.0–77.0)
Platelets: 252 10*3/uL (ref 150.0–400.0)
RBC: 4.27 Mil/uL (ref 3.87–5.11)
RDW: 13.6 % (ref 11.5–15.5)
WBC: 3.3 10*3/uL — ABNORMAL LOW (ref 4.0–10.5)

## 2020-10-02 LAB — LIPID PANEL
Cholesterol: 149 mg/dL (ref 0–200)
HDL: 66.2 mg/dL (ref >39.00)
LDL Cholesterol: 72 mg/dL (ref 0–99)
NonHDL: 83.23
Total CHOL/HDL Ratio: 2
Triglycerides: 57 mg/dL (ref 0.0–149.0)
VLDL: 11.4 mg/dL (ref 0.0–40.0)

## 2020-10-02 LAB — TSH: TSH: 2.54 u[IU]/mL (ref 0.35–4.50)

## 2020-10-02 LAB — VITAMIN D 25 HYDROXY (VIT D DEFICIENCY, FRACTURES): VITD: 52.09 ng/mL (ref 30.00–100.00)

## 2020-10-02 LAB — HEMOGLOBIN A1C: Hgb A1c MFr Bld: 6 % (ref 4.6–6.5)

## 2020-10-03 ENCOUNTER — Encounter: Payer: Self-pay | Admitting: Internal Medicine

## 2020-10-03 ENCOUNTER — Ambulatory Visit (INDEPENDENT_AMBULATORY_CARE_PROVIDER_SITE_OTHER): Payer: PPO | Admitting: Internal Medicine

## 2020-10-03 VITALS — BP 150/80 | HR 123 | Temp 98.2°F | Ht <= 58 in | Wt 148.6 lb

## 2020-10-03 DIAGNOSIS — H259 Unspecified age-related cataract: Secondary | ICD-10-CM

## 2020-10-03 DIAGNOSIS — F419 Anxiety disorder, unspecified: Secondary | ICD-10-CM | POA: Diagnosis not present

## 2020-10-03 DIAGNOSIS — R000 Tachycardia, unspecified: Secondary | ICD-10-CM

## 2020-10-03 DIAGNOSIS — R7303 Prediabetes: Secondary | ICD-10-CM

## 2020-10-03 DIAGNOSIS — Z0001 Encounter for general adult medical examination with abnormal findings: Secondary | ICD-10-CM

## 2020-10-03 DIAGNOSIS — I10 Essential (primary) hypertension: Secondary | ICD-10-CM

## 2020-10-03 DIAGNOSIS — F32A Depression, unspecified: Secondary | ICD-10-CM

## 2020-10-03 DIAGNOSIS — Z1231 Encounter for screening mammogram for malignant neoplasm of breast: Secondary | ICD-10-CM | POA: Diagnosis not present

## 2020-10-03 DIAGNOSIS — F4321 Adjustment disorder with depressed mood: Secondary | ICD-10-CM

## 2020-10-03 DIAGNOSIS — J309 Allergic rhinitis, unspecified: Secondary | ICD-10-CM

## 2020-10-03 DIAGNOSIS — E2839 Other primary ovarian failure: Secondary | ICD-10-CM

## 2020-10-03 LAB — URINALYSIS, ROUTINE W REFLEX MICROSCOPIC
Bilirubin Urine: NEGATIVE
Glucose, UA: NEGATIVE
Hgb urine dipstick: NEGATIVE
Ketones, ur: NEGATIVE
Leukocytes,Ua: NEGATIVE
Nitrite: NEGATIVE
Protein, ur: NEGATIVE
Specific Gravity, Urine: 1.004 (ref 1.001–1.035)
pH: 6.5 (ref 5.0–8.0)

## 2020-10-03 LAB — PATHOLOGIST SMEAR REVIEW

## 2020-10-03 MED ORDER — FLUTICASONE PROPIONATE 50 MCG/ACT NA SUSP: NASAL | 12 refills | Status: DC

## 2020-10-03 MED ORDER — NEBIVOLOL HCL 2.5 MG PO TABS: 2.5000 mg | ORAL_TABLET | Freq: Every day | ORAL | 3 refills | Status: DC

## 2020-10-03 NOTE — Patient Instructions (Addendum)
Consider Tdap vaccine and 3rd and 4th moderna   Call and schedule 11/08/20 mammogram and bone density   Therapy   [facility] [address] [place] [phone] 2. [facility]  [facility] [place] [address] [phone]   [facility] [place] [address] [place] [address] [phone]  [facility] [address] [phone]  Td (Tetanus, Diphtheria) Vaccine (Tdap) : What You Need to Know 1. Why get vaccinated? Td vaccine can prevent tetanus and diphtheria. Tetanus enters the body through cuts or wounds. Diphtheria spreads from person to person. TETANUS (T) causes painful stiffening of the muscles. Tetanus can lead to serious health problems, including being unable to open the mouth, having trouble swallowing and breathing, or death. DIPHTHERIA (D) can lead to difficulty breathing, heart failure, paralysis, or death. 2. Td vaccine Td is only for children 7 years and older, adolescents, and adults.  Td is usually given as a booster dose every 10 years, or after 5 years in the case of a severe or dirty wound or burn. Another vaccine, called "Tdap," may be used instead of Td. Tdap protects against pertussis, also known as "whooping cough," in addition to tetanus anddiphtheria. Td may be given at the same time as other vaccines. 3. Talk with your health care provider Tell your vaccination provider if the person getting the vaccine: Has had an allergic reaction after a previous dose of any vaccine that protects against tetanus or diphtheria, or has any severe, life-threatening allergies Has ever had Guillain-Barr Syndrome (also called "GBS") Has had severe pain or swelling after a previous dose of any vaccine that protects against tetanus or diphtheria In some cases, your health care provider may decide to postpone Td vaccinationuntil a future visit. People  with minor illnesses, such as a cold, may be vaccinated. People who are moderately or severely ill should usually wait until they recover beforegetting Td vaccine.  Your health care provider can give you more information. 4. Risks of a vaccine reaction Pain, redness, or swelling where the shot was given, mild fever, headache, feeling tired, and nausea, vomiting, diarrhea, or stomachache sometimes happen after Td vaccination. People sometimes faint after medical procedures, including vaccination. Tellyour provider if you feel dizzy or have vision changes or ringing in the ears.  As with any medicine, there is a very remote chance of a vaccine causing asevere allergic reaction, other serious injury, or death. 5. What if there is a serious problem? An allergic reaction could occur after the vaccinated person leaves the clinic. If you see signs of a severe allergic reaction (hives, swelling of the face and throat, difficulty breathing, a fast heartbeat, dizziness, or weakness), call [phone] get the person to the nearest hospital.  For other signs that concern you, call your health care provider.  Adverse reactions should be reported to the Vaccine Adverse Event Reporting System (VAERS). Your health care provider will usually file this report, or you can do it yourself. Visit the VAERS website at www.vaers.SamedayNews.es or call 445-457-2341. VAERS is only for reporting reactions, and VAERS staff members do not give medical advice. 6. The National Vaccine Injury Compensation Program The [employer] Vaccine Injury Compensation Program (VICP) is a federal program that was created to compensate people who may have been injured by certain vaccines. Claims regarding alleged injury or death due to vaccination have a time limit for filing, which may  be as short as two years. Visit the VICP website at GoldCloset.com.ee or call 254 738 4728to learn about the program and about filing a claim. 7. How can I  learn more? Ask your health care provider. Call your local or state health department. Visit the website of the Food and Drug Administration (FDA) for vaccine package inserts and additional information at TraderRating.uy. Contact the Centers for Disease Control and Prevention (CDC): Call (517)849-7526 (1-800-CDC-INFO) or Visit CDC's website at http://hunter.com/. Vaccine Information Statement Td (Tetanus, Diphtheria) Vaccine (11/26/2019) This information is not intended to replace advice given to you by your health care provider. Make sure you discuss any questions you have with your healthcare provider. Document Revised: 01/13/2020 Document Reviewed: 01/13/2020 Elsevier Patient Education  2022 [employer].

## 2020-10-03 NOTE — Progress Notes (Signed)
Chief Complaint  Patient presents with   Follow-up   Annual  Anxiety/depression phq 9 score 8 and gad 7 6 husband died 06/05/2020 motorcycle accident married 55 years declines meds wants therapy possibly with Melody Comas and ST uncontrolled not checking BP at home she is anxiety today takes losartan 100 mg qd  W/o sxs  Review of Systems  Constitutional:  Positive for weight loss.  HENT:  Negative for hearing loss.   Eyes:  Negative for blurred vision.  Respiratory:  Negative for shortness of breath.   Cardiovascular:  Negative for chest pain.  Gastrointestinal:  Negative for abdominal pain.  Musculoskeletal:  Positive for falls. Negative for joint pain.  Skin:  Negative for rash.  Neurological:  Negative for dizziness and headaches.  Psychiatric/Behavioral:  Positive for depression. The patient is nervous/anxious.   Past Medical History:  Diagnosis Date   Hypertension    Prediabetes    Past Surgical History:  Procedure Laterality Date   CESAREAN SECTION     FEMUR SURGERY     left, s/p rod for fracture   TUBAL LIGATION     Family History  Problem Relation Age of Onset   Diabetes Mother    Hypertension Mother    Heart disease Mother    Cancer Mother        breast and stomach   Breast cancer Mother 44   Diabetes Father    Hypertension Father    Heart disease Father    Kidney disease Father    Social History   Socioeconomic History   Marital status: Married    Spouse name: Not on file   Number of children: Not on file   Years of education: Not on file   Highest education level: Not on file  Occupational History   Not on file  Tobacco Use   Smoking status: Never   Smokeless tobacco: Never  Substance and Sexual Activity   Alcohol use: No   Drug use: Not on file   Sexual activity: Not on file  Other Topics Concern   Not on file  Social History Narrative   Lives in [place] but husband died 06/05/20 motorcycle accident bday 10/19/20 married 82 years   No pets   2  daughters and 3 granddaughters      Work - [employer] retired 09/2019   Diet - regular diet   Exercise - none   Social Determinants of Radio broadcast assistant Strain: Not on file  Food Insecurity: Not on file  Transportation Needs: Not on file  Physical Activity: Not on file  Stress: Not on file  Social Connections: Not on file  Intimate Partner Violence: Not on file   Current Meds  Medication Sig   Chromium Picolinate (CHROMIUM PICOLATE PO) Take by mouth daily.   loratadine (CLARITIN) 10 MG tablet Take 1 tablet (10 mg total) by mouth daily as needed for allergies.   losartan (COZAAR) 100 MG tablet TAKE ONE TABLET BY MOUTH DAILY   Multiple Vitamin (ONE-DAILY MULTI-VITAMIN PO) Take by mouth daily.   nebivolol (BYSTOLIC) 2.5 MG tablet Take 1 tablet (2.5 mg total) by mouth daily.   [DISCONTINUED] fluticasone (FLONASE) 50 MCG/ACT nasal spray USE TWO SPRAYS IN EACH NOSTRIL ONCE DAILY prn   Allergies  Allergen Reactions   Diclofenac Hives   Lisinopril     cough   Sulfa Antibiotics     ? Allergy    Recent Results (from the past 2160 hour(s))  Vitamin D (  25 hydroxy)     Status: None   Collection Time: 10/02/20  8:46 AM  Result Value Ref Range   VITD 52.09 30.00 - 100.00 ng/mL  Hemoglobin A1c     Status: None   Collection Time: 10/02/20  8:46 AM  Result Value Ref Range   Hgb A1c MFr Bld 6.0 4.6 - 6.5 %    Comment: Glycemic Control Guidelines for People with Diabetes:Non Diabetic:  <6%Goal of Therapy: <7%Additional Action Suggested:  >8%   Urinalysis, Routine w reflex microscopic     Status: None   Collection Time: 10/02/20  8:46 AM  Result Value Ref Range   Color, Urine YELLOW YELLOW   APPearance CLEAR CLEAR   Specific Gravity, Urine 1.004 1.001 - 1.035   pH 6.5 5.0 - 8.0   Glucose, UA NEGATIVE NEGATIVE   Bilirubin Urine NEGATIVE NEGATIVE   Ketones, ur NEGATIVE NEGATIVE   Hgb urine dipstick NEGATIVE NEGATIVE   Protein, ur NEGATIVE NEGATIVE   Nitrite NEGATIVE  NEGATIVE   Leukocytes,Ua NEGATIVE NEGATIVE  TSH     Status: None   Collection Time: 10/02/20  8:46 AM  Result Value Ref Range   TSH 2.54 0.35 - 4.50 uIU/mL  CBC with Differential/Platelet     Status: Abnormal   Collection Time: 10/02/20  8:46 AM  Result Value Ref Range   WBC 3.3 (L) 4.0 - 10.5 K/uL   RBC 4.27 3.87 - 5.11 Mil/uL   Hemoglobin 13.5 12.0 - 15.0 g/dL   HCT 40.8 36.0 - 46.0 %   MCV 95.6 78.0 - 100.0 fl   MCHC 33.0 30.0 - 36.0 g/dL   RDW 13.6 11.5 - 15.5 %   Platelets 252.0 150.0 - 400.0 K/uL   Neutrophils Relative % 50.6 43.0 - 77.0 %   Lymphocytes Relative 41.3 12.0 - 46.0 %   Monocytes Relative 6.0 3.0 - 12.0 %   Eosinophils Relative 1.3 0.0 - 5.0 %   Basophils Relative 0.8 0.0 - 3.0 %   Neutro Abs 1.7 1.4 - 7.7 K/uL   Lymphs Abs 1.4 0.7 - 4.0 K/uL   Monocytes Absolute 0.2 0.1 - 1.0 K/uL   Eosinophils Absolute 0.0 0.0 - 0.7 K/uL   Basophils Absolute 0.0 0.0 - 0.1 K/uL  Comprehensive metabolic panel     Status: None   Collection Time: 10/02/20  8:46 AM  Result Value Ref Range   Sodium 140 135 - 145 mEq/L   Potassium 3.6 3.5 - 5.1 mEq/L   Chloride 105 96 - 112 mEq/L   CO2 26 19 - 32 mEq/L   Glucose, Bld 94 70 - 99 mg/dL   BUN 14 6 - 23 mg/dL   Creatinine, Ser 0.82 0.40 - 1.20 mg/dL   Total Bilirubin 0.9 0.2 - 1.2 mg/dL   Alkaline Phosphatase 78 39 - 117 U/L   AST 19 0 - 37 U/L   ALT 14 0 - 35 U/L   Total Protein 7.2 6.0 - 8.3 g/dL   Albumin 4.4 3.5 - 5.2 g/dL   GFR 73.24 >60.00 mL/min    Comment: Calculated using the CKD-EPI Creatinine Equation (2021)   Calcium 9.4 8.4 - 10.5 mg/dL  Lipid panel     Status: None   Collection Time: 10/02/20  8:46 AM  Result Value Ref Range   Cholesterol 149 0 - 200 mg/dL    Comment: ATP III Classification       Desirable:  < 200 mg/dL  Borderline High:  200 - 239 mg/dL          High:  > = 240 mg/dL   Triglycerides 57.0 0.0 - 149.0 mg/dL    Comment: Normal:  <150 mg/dLBorderline High:  150 - 199 mg/dL   HDL  66.20 >39.00 mg/dL   VLDL 11.4 0.0 - 40.0 mg/dL   LDL Cholesterol 72 0 - 99 mg/dL   Total CHOL/HDL Ratio 2     Comment:                Men          Women1/2 Average Risk     3.4          3.3Average Risk          5.0          4.42X Average Risk          9.6          7.13X Average Risk          15.0          11.0                       NonHDL 83.23     Comment: NOTE:  Non-HDL goal should be 30 mg/dL higher than patient's LDL goal (i.e. LDL goal of < 70 mg/dL, would have non-HDL goal of < 100 mg/dL)   Objective  Body mass index is 31.2 kg/m. Wt Readings from Last 3 Encounters:  10/03/20 148 lb 9.6 oz (67.4 kg)  01/17/20 155 lb (70.3 kg)  09/30/19 158 lb 1.9 oz (71.7 kg)   Temp Readings from Last 3 Encounters:  10/03/20 98.2 F (36.8 C) (Oral)  09/30/19 (!) 97.5 F (36.4 C) (Temporal)  07/21/19 97.7 F (36.5 C) (Temporal)   BP Readings from Last 3 Encounters:  10/03/20 (!) 150/80  01/17/20 (!) 141/82  09/30/19 126/84   Pulse Readings from Last 3 Encounters:  10/03/20 (!) 123  01/17/20 88  09/30/19 (!) 121    Physical Exam Vitals and nursing note reviewed.  Constitutional:      Appearance: Normal appearance. She is well-developed and well-groomed. She is obese.  Cardiovascular:     Rate and Rhythm: Regular rhythm. Tachycardia present.     Heart sounds: Normal heart sounds. No murmur heard. Pulmonary:     Effort: Pulmonary effort is normal.     Breath sounds: Normal breath sounds.  Abdominal:     Tenderness: There is no abdominal tenderness.  Skin:    General: Skin is warm and dry.  Neurological:     General: No focal deficit present.     Mental Status: She is alert and oriented to person, place, and time. Mental status is at baseline.     Gait: Gait normal.  Psychiatric:        Attention and Perception: Attention and perception normal.        Mood and Affect: Mood and affect normal.        Speech: Speech normal.        Behavior: Behavior normal. Behavior is  cooperative.        Thought Content: Thought content normal.        Cognition and Memory: Cognition and memory normal.        Judgment: Judgment normal.    Assessment  Plan  Encounter for general adult medical examination with abnormal findings See below HM  Grief Anxiety and depression Disc with  therapy today   Allergic rhinitis, unspecified seasonality, unspecified trigger - Plan: fluticasone (FLONASE) 50 MCG/ACT nasal spray  Senile cataract of left eye, unspecified age-related cataract type F/u eye MD no surgery yet    Hypertension, unspecified type - Plan: nebivolol (BYSTOLIC) 2.5 MG tablet losartan 100 mg qd will monitor BP and get filled if BP>130/>80  Sinus tachycardia - Plan: nebivolol (BYSTOLIC) 2.5 MG tablet  Prediabetes  Healthy diet and exercise   HM Declines vaccines will redisc Tdap in future rx given 10/03/20  covid had 2/2 consider booster   Out of age window pap had 10/04/16 normal no HPV no h/o abnormal Colonoscopy had 07/23/13 Dr. Tiffany Kocher hyperplastic polyp repeat in 10 years  mammo 10/2017 oil cyst right breast repeat dx mammo in 6 months -re ordered mammo 11/20/18 had 05/22/18 rec b/l diag in 6 months right breast cyst  -ordered mammo pt to call and schedule   DEXA 07/04/09 normal Declines STD check, hep B Hep C neg 10/04/16 rec healthy diet choices and exercise     Provider: Dr. Olivia Mackie McLean-Scocuz[facility]

## 2020-10-12 IMAGING — MG DIGITAL DIAGNOSTIC BILAT W/ TOMO W/ CAD
8 series · 8 of 24 positions shown · non-contrast
Comparison: 11/05/2018 and earlier

CLINICAL DATA: Two year follow-up for 2 probably benign masses in
the RIGHT breast.

EXAM:
DIGITAL DIAGNOSTIC BILATERAL MAMMOGRAM WITH TOMO AND CAD

[L CC synth-2D]
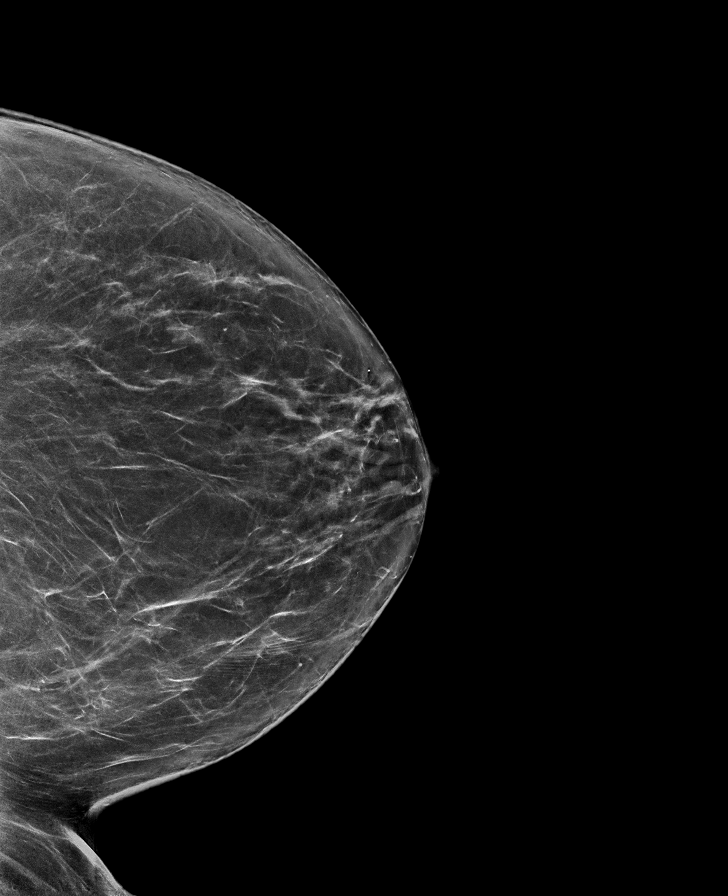

[R CC synth-2D]
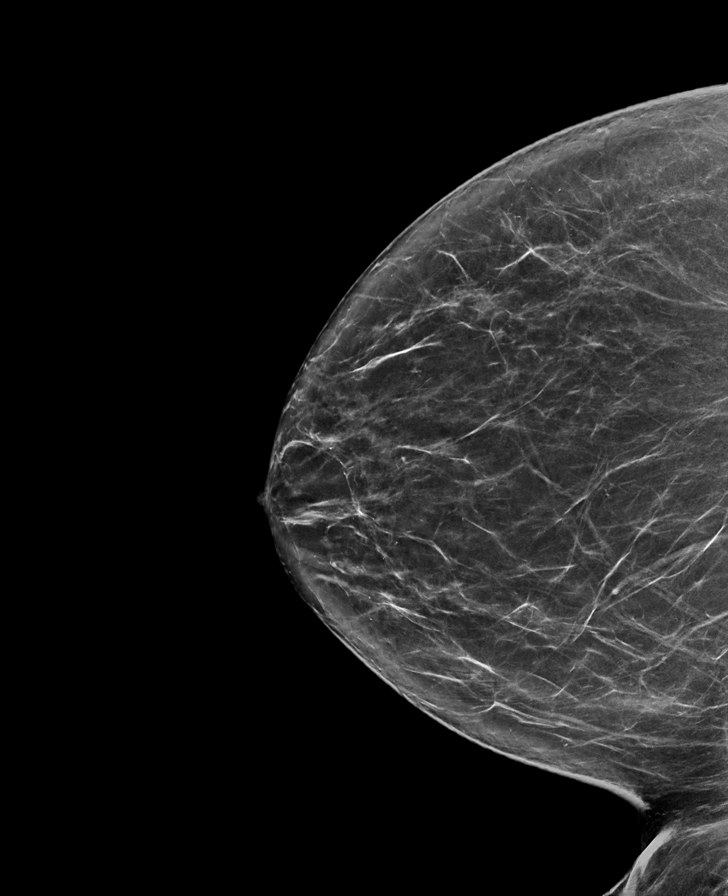

[R MLO synth-2D]
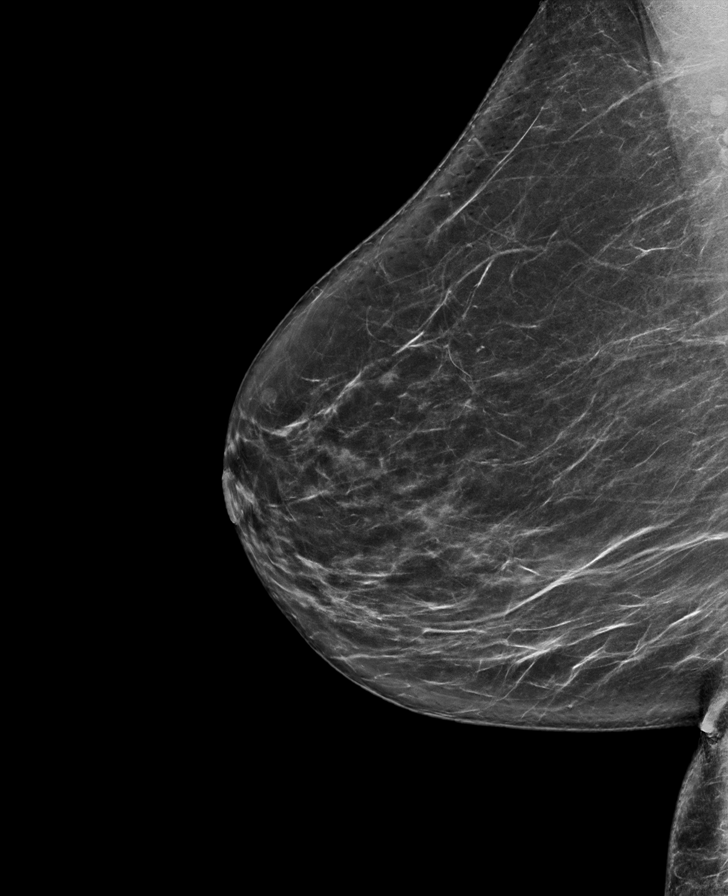

[L MLO synth-2D]
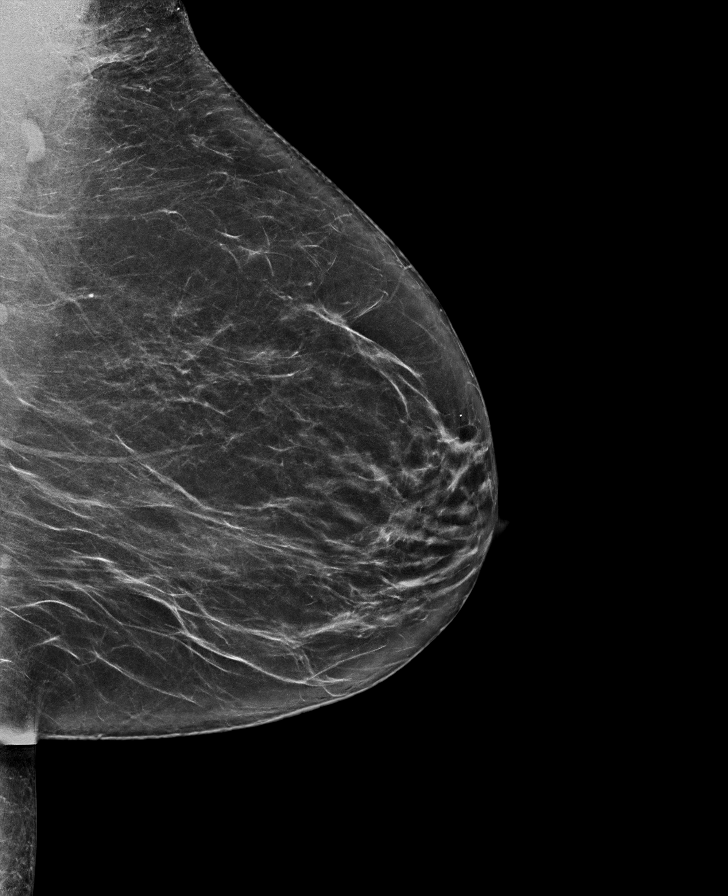

[R CC tomo · tomo slice 39/76.0]
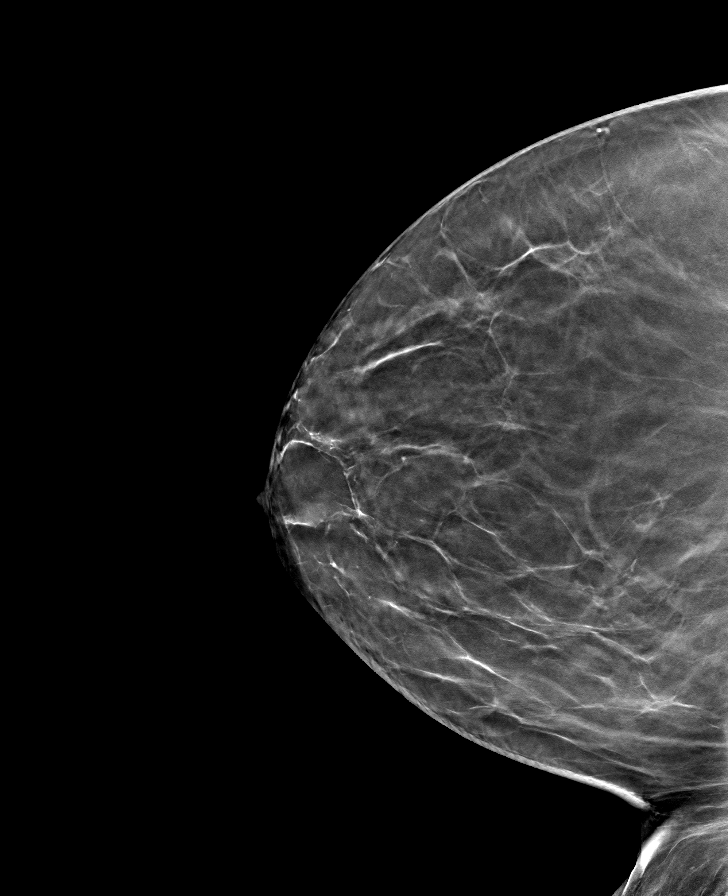

[L CC tomo · tomo slice 39/77.0]
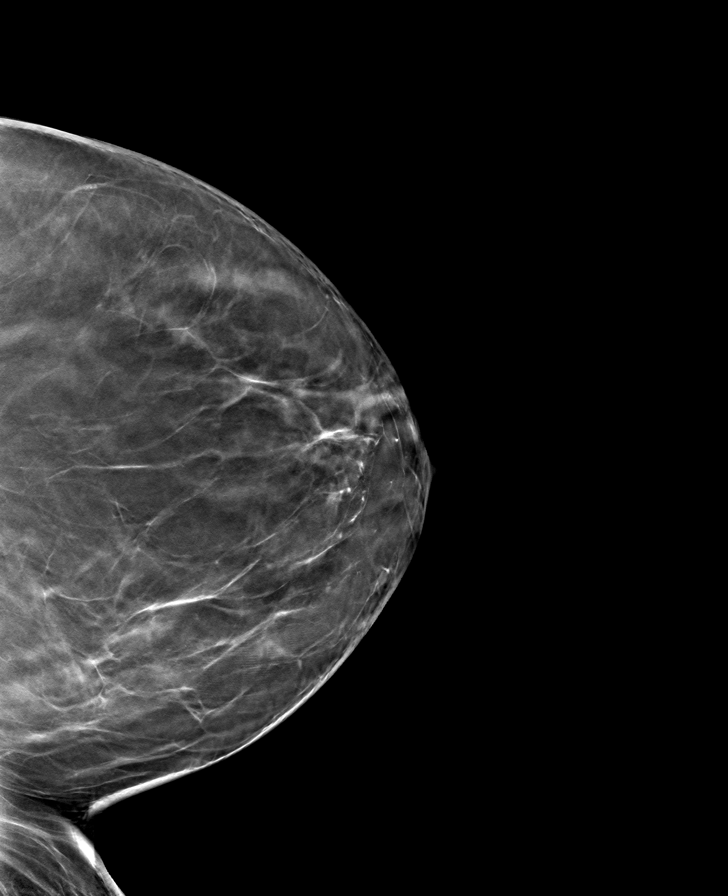

[R MLO tomo · tomo slice 41/80.0]
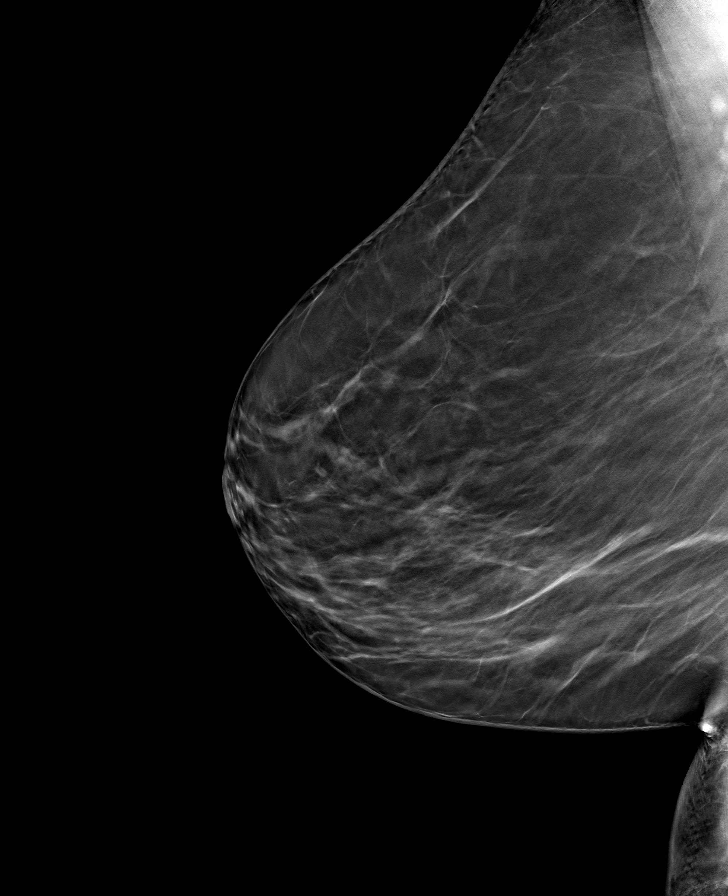

[L MLO tomo · tomo slice 40/79.0]
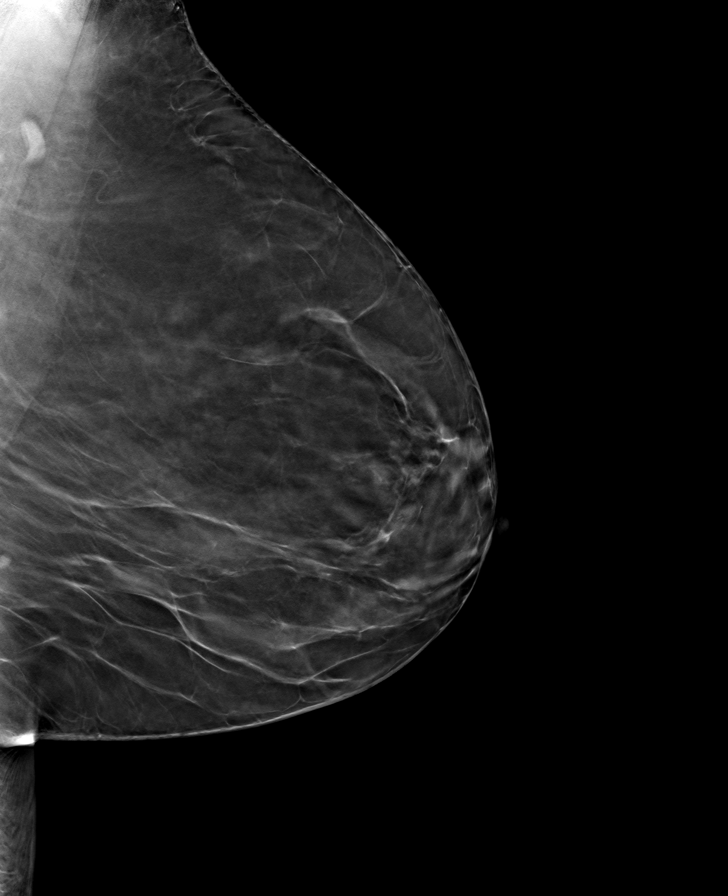

[8 of 24 positions shown; findings below may reference images not displayed]

ACR Breast Density Category b: There are scattered areas of
fibroglandular density.
FINDINGS: Circumscribed low-attenuation mass in the superficial LOWER OUTER
QUADRANT of the RIGHT breast appear stable compared with prior
studies. A smaller mass is identified also in the LOWER OUTER
QUADRANT RIGHT breast, posterior depth, and also appears stable. No
new or suspicious findings in either breast.

Mammographic images were processed with CAD.
IMPRESSION: No mammographic evidence for malignancy. Stable benign masses in the
RIGHT breast.

RECOMMENDATION:
Screening mammogram in one year.(Code:[id number])

I have discussed the findings and recommendations with the patient.
If applicable, a reminder letter will be sent to the patient
regarding the next appointment.

BI-RADS CATEGORY  2: Benign.

## 2020-12-11 ENCOUNTER — Ambulatory Visit
Admission: RE | Admit: 2020-12-11 | Discharge: 2020-12-11 | Disposition: A | Discharge status: Home / Self Care | Payer: PPO | Source: Ambulatory Visit | Attending: Internal Medicine | Admitting: Internal Medicine

## 2020-12-11 ENCOUNTER — Other Ambulatory Visit: Payer: Self-pay

## 2020-12-11 DIAGNOSIS — Z1231 Encounter for screening mammogram for malignant neoplasm of breast: Secondary | ICD-10-CM

## 2020-12-11 DIAGNOSIS — E2839 Other primary ovarian failure: Secondary | ICD-10-CM | POA: Insufficient documentation

## 2020-12-11 DIAGNOSIS — Z780 Asymptomatic menopausal state: Secondary | ICD-10-CM | POA: Diagnosis not present

## 2020-12-19 ENCOUNTER — Ambulatory Visit (INDEPENDENT_AMBULATORY_CARE_PROVIDER_SITE_OTHER): Payer: PPO

## 2020-12-19 VITALS — BP 129/86 | HR 85 | Ht 59.84 in | Wt 148.0 lb

## 2020-12-19 DIAGNOSIS — Z0000 Encounter for general adult medical examination without abnormal findings: Secondary | ICD-10-CM | POA: Diagnosis not present

## 2020-12-19 NOTE — Patient Instructions (Addendum)
  Ms. Beever , Thank you for taking time to come for your Medicare Wellness Visit. I appreciate your ongoing commitment to your health goals. Please review the following plan we discussed and let me know if I can assist you in the future.   These are the goals we discussed:  Goals      Maintain Healthy Lifestyle     Healthy diet Stay active        This is a list of the screening recommended for you and due dates:  Health Maintenance  Topic Date Due   Zoster (Shingles) Vaccine (1 of 2) 01/03/2021*   COVID-19 Vaccine (3 - Moderna risk series) 09/20/2021*   Pneumonia vaccines (1 of 2 - PCV13) 10/03/2021*   Tetanus Vaccine  12/19/2021*   Mammogram  12/12/2022   Colon Cancer Screening  07/22/2023   DEXA scan (bone density measurement)  Completed   Hepatitis C Screening: USPSTF Recommendation to screen - Ages 2-79 yo.  Completed   HPV Vaccine  Aged Out   Flu Shot  Discontinued  *Topic was postponed. The date shown is not the original due date.

## 2020-12-19 NOTE — Progress Notes (Signed)
Subjective:   Victoria Foley is a 70 y.o. female who presents for an Initial Medicare Annual Wellness Visit.  Review of Systems    No ROS.  Medicare Wellness Virtual Visit.  Visual/audio telehealth visit, UTA vital signs.   See social history for additional risk factors.   Cardiac Risk Factors include: advanced age (>45men, >107 women);hypertension     Objective:    Today's Vitals   12/19/20 1041  BP: 129/86  Pulse: 85  Weight: 148 lb (67.1 kg)  Height: 4' 11.84" (1.52 m)   Body mass index is 29.06 kg/m.  Advanced Directives 12/19/2020 10/22/2017  Does Patient Have a Medical Advance Directive? No Yes  Type of Advance Directive - Living will  Does patient want to make changes to medical advance directive? - No - Patient declined  Would patient like information on creating a medical advance directive? No - Patient declined -    Current Medications (verified) Outpatient Encounter Medications as of 12/19/2020  Medication Sig   Chromium Picolinate (CHROMIUM PICOLATE PO) Take by mouth daily.   fluticasone (FLONASE) 50 MCG/ACT nasal spray USE TWO SPRAYS IN EACH NOSTRIL ONCE DAILY prn   loratadine (CLARITIN) 10 MG tablet Take 10 mg by mouth daily.   losartan (COZAAR) 100 MG tablet TAKE ONE TABLET BY MOUTH DAILY   Multiple Vitamin (ONE-DAILY MULTI-VITAMIN PO) Take by mouth daily.   nebivolol (BYSTOLIC) 2.5 MG tablet Take 1 tablet (2.5 mg total) by mouth daily. (Patient not taking: Reported on 12/19/2020)   Omega-3 Fatty Acids (FISH OIL) 1000 MG CAPS Take by mouth. (Patient not taking: Reported on 10/03/2020)   No facility-administered encounter medications on file as of 12/19/2020.    Allergies (verified) Diclofenac, Lisinopril, and Sulfa antibiotics   History: Past Medical History:  Diagnosis Date   Hypertension    Prediabetes    Past Surgical History:  Procedure Laterality Date   CESAREAN SECTION     FEMUR SURGERY     left, s/p rod for fracture   TUBAL LIGATION      Family History  Problem Relation Age of Onset   Diabetes Mother    Hypertension Mother    Heart disease Mother    Cancer Mother        breast and stomach   Breast cancer Mother 12   Diabetes Father    Hypertension Father    Heart disease Father    Kidney disease Father    Social History   Socioeconomic History   Marital status: Married    Spouse name: Not on file   Number of children: Not on file   Years of education: Not on file   Highest education level: Not on file  Occupational History   Not on file  Tobacco Use   Smoking status: Never   Smokeless tobacco: Never  Substance and Sexual Activity   Alcohol use: No   Drug use: Not on file   Sexual activity: Not on file  Other Topics Concern   Not on file  Social History Narrative   Lives in [place] but husband died 06-26-2020 motorcycle accident bday 10/19/20 married 74 years   No pets   2 daughters and 3 granddaughters      Work - [employer] retired 09/2019   Diet - regular diet   Exercise - none   Social Determinants of Health   Financial Resource Strain: Low Risk    Difficulty of Paying Living Expenses: Not hard at all  Food Insecurity: No  Food Insecurity   Worried About Charity fundraiser in the Last Year: Never true   Ran Out of Food in the Last Year: Never true  Transportation Needs: No Transportation Needs   Lack of Transportation (Medical): No   Lack of Transportation (Non-Medical): No  Physical Activity: Not on file  Stress: No Stress Concern Present   Feeling of Stress : Not at all  Social Connections: Unknown   Frequency of Communication with Friends and Family: More than three times a week   Frequency of Social Gatherings with Friends and Family: Not on file   Attends Religious Services: Not on file   Active Member of Clubs or Organizations: Not on file   Attends Archivist Meetings: Not on file   Marital Status: Not on file    Tobacco Counseling Counseling given: Not  Answered   Clinical Intake:  Pre-visit preparation completed: Yes        Diabetes: No  How often do you need to have someone help you when you read instructions, pamphlets, or other written materials from your doctor or pharmacy?: 1 - Never   Interpreter Needed?: No      Activities of Daily Living In your present state of health, do you have any difficulty performing the following activities: 12/19/2020  Hearing? N  Vision? N  Difficulty concentrating or making decisions? N  Walking or climbing stairs? N  Dressing or bathing? N  Doing errands, shopping? N  Preparing Food and eating ? N  Using the Toilet? N  In the past six months, have you accidently leaked urine? N  Do you have problems with loss of bowel control? N  Managing your Medications? N  Managing your Finances? N  Housekeeping or managing your Housekeeping? N  Some recent data might be hidden    Patient Care Team: McLean-Scocuzza, Nino Glow, MD as PCP - General (Internal Medicine)  Indicate any recent Medical Services you may have received from other than [facility] providers in the past year (date may be approximate).     Assessment:   This is a routine wellness examination for Victoria Foley.  I connected with Victoria Foley today by telephone and verified that I am speaking with the correct person using two identifiers. Location patient: home Location provider: work Persons participating in the virtual visit: patient, Marine scientist.    I discussed the limitations, risks, security and privacy concerns of performing an evaluation and management service by telephone and the availability of in person appointments. The patient expressed understanding and verbally consented to this telephonic visit.    Interactive audio and video telecommunications were attempted between this provider and patient, however failed, due to patient having technical difficulties OR patient did not have access to video capability.  We continued and completed  visit with audio only.  Some vital signs may be absent or patient reported.   Hearing/Vision screen Hearing Screening - Comments:: Patient is able to hear conversational tones without difficulty.  No issues reported. Vision Screening - Comments:: They have seen their ophthalmologist in the last 12 months.    Dietary issues and exercise activities discussed: Current Exercise Habits: Home exercise routine, Type of exercise: strength training/weights, Intensity: Mild   Goals Addressed             This Visit's Progress    Maintain Healthy Lifestyle       Healthy diet Stay active       Depression Screen [facility] 2/9 Scores 12/19/2020 10/03/2020 09/30/2019 07/21/2019 01/13/2018 10/04/2015  06/07/2014  PHQ - 2 Score 0 2 0 0 0 0 0  PHQ- 9 Score - 8 - - - - -    Fall Risk Fall Risk  12/19/2020 10/03/2020 09/30/2019 07/21/2019 01/13/2018  Falls in the past year? 0 1 0 0 No  Number falls in past yr: - 1 0 0 -  Injury with Fall? - 1 0 0 -  Risk for fall due to : - History of fall(s) - - -  Follow up Falls evaluation completed Falls evaluation completed Falls evaluation completed Falls evaluation completed -    FALL RISK PREVENTION PERTAINING TO THE HOME: Adequate lighting in your home to reduce risk of falls? Yes   ASSISTIVE DEVICES UTILIZED TO PREVENT FALLS: Use of a cane, walker or w/c? No   TIMED UP AND GO: Was the test performed? No .   Cognitive Function:        Immunizations Immunization History  Administered Date(s) Administered   Moderna Sars-Covid-2 Vaccination 06/14/2019, 07/13/2019   TDAP status: Due, Education has been provided regarding the importance of this vaccine. Advised may receive this vaccine at local pharmacy or Health Dept. Aware to provide a copy of the vaccination record if obtained from local pharmacy or Health Dept. Verbalized acceptance and understanding. Deferred.   Health Maintenance Health Maintenance  Topic Date Due   Zoster Vaccines- Shingrix (1 of  2) 01/03/2021 (Originally 11/03/1969)   COVID-19 Vaccine (3 - Moderna risk series) 09/20/2021 (Originally 08/10/2019)   PNA vac Low Risk Adult (1 of 2 - PCV13) 10/03/2021 (Originally 11/02/2016)   TETANUS/TDAP  12/19/2021 (Originally 11/03/1969)   MAMMOGRAM  12/12/2022   COLONOSCOPY (Pts 45-60yrs Insurance coverage will need to be confirmed)  07/22/2023   DEXA SCAN  Completed   Hepatitis C Screening  Completed   HPV VACCINES  Aged Out   INFLUENZA VACCINE  Discontinued   Lung Cancer Screening: (Low Dose CT Chest recommended if Age 58-80 years, 30 pack-year currently smoking OR have quit w/in 15years.) does not qualify.   Vision Screening: Recommended annual ophthalmology exams for early detection of glaucoma and other disorders of the eye.  Dental Screening: Recommended annual dental exams for proper oral hygiene.  Community Resource Referral / Chronic Care Management: CRR required this visit?  No   CCM required this visit?  No      Plan:   Keep all routine maintenance appointments.   I have personally reviewed and noted the following in the patient's chart:   Medical and social history Use of alcohol, tobacco or illicit drugs  Current medications and supplements including opioid prescriptions. Patient is not currently taking opioid prescriptions. Functional ability and status Nutritional status Physical activity Advanced directives List of other physicians Hospitalizations, surgeries, and ER visits in previous 12 months Vitals Screenings to include cognitive, depression, and falls Referrals and appointments  In addition, I have reviewed and discussed with patient certain preventive protocols, quality metrics, and best practice recommendations. A written personalized care plan for preventive services as well as general preventive health recommendations were provided to patient via mychart.     Varney Biles, LPN   4/33/2951

## 2021-01-16 ENCOUNTER — Other Ambulatory Visit: Payer: Self-pay | Admitting: Internal Medicine

## 2021-01-25 ENCOUNTER — Telehealth: Payer: Self-pay | Admitting: Internal Medicine

## 2021-01-25 NOTE — Telephone Encounter (Signed)
Patient is requesting a refill on her loratadine (CLARITIN) 10 MG tablet.

## 2021-01-25 NOTE — Telephone Encounter (Signed)
Called and spoke with [name]. Victoria Foley states that she was unaware that we have already sent off the medication. Pt will call the pharmacy and go pick up her medicine.

## 2021-03-05 ENCOUNTER — Telehealth: Payer: Self-pay | Admitting: Internal Medicine

## 2021-03-05 NOTE — Telephone Encounter (Signed)
Patient returned your call.

## 2021-03-05 NOTE — Telephone Encounter (Signed)
See mychart in regards to patient's mom.

## 2021-03-06 DIAGNOSIS — M545 Low back pain, unspecified: Secondary | ICD-10-CM | POA: Diagnosis not present

## 2021-03-06 DIAGNOSIS — M25551 Pain in right hip: Secondary | ICD-10-CM | POA: Diagnosis not present

## 2021-03-08 DIAGNOSIS — H2513 Age-related nuclear cataract, bilateral: Secondary | ICD-10-CM | POA: Diagnosis not present

## 2021-03-09 DIAGNOSIS — M545 Low back pain, unspecified: Secondary | ICD-10-CM | POA: Diagnosis not present

## 2021-03-26 ENCOUNTER — Encounter: Payer: Self-pay | Admitting: Internal Medicine

## 2021-03-27 DIAGNOSIS — M545 Low back pain, unspecified: Secondary | ICD-10-CM | POA: Diagnosis not present

## 2021-04-03 DIAGNOSIS — M5416 Radiculopathy, lumbar region: Secondary | ICD-10-CM | POA: Diagnosis not present

## 2021-04-05 ENCOUNTER — Encounter: Payer: Self-pay | Admitting: Internal Medicine

## 2021-04-05 ENCOUNTER — Ambulatory Visit (INDEPENDENT_AMBULATORY_CARE_PROVIDER_SITE_OTHER): Payer: PPO | Admitting: Internal Medicine

## 2021-04-05 ENCOUNTER — Other Ambulatory Visit: Payer: Self-pay

## 2021-04-05 VITALS — BP 128/86 | HR 92 | Temp 97.2°F | Ht 59.84 in | Wt 151.0 lb

## 2021-04-05 DIAGNOSIS — R7303 Prediabetes: Secondary | ICD-10-CM | POA: Diagnosis not present

## 2021-04-05 DIAGNOSIS — H269 Unspecified cataract: Secondary | ICD-10-CM

## 2021-04-05 DIAGNOSIS — D708 Other neutropenia: Secondary | ICD-10-CM

## 2021-04-05 DIAGNOSIS — F4321 Adjustment disorder with depressed mood: Secondary | ICD-10-CM

## 2021-04-05 DIAGNOSIS — R937 Abnormal findings on diagnostic imaging of other parts of musculoskeletal system: Secondary | ICD-10-CM

## 2021-04-05 DIAGNOSIS — Z1231 Encounter for screening mammogram for malignant neoplasm of breast: Secondary | ICD-10-CM

## 2021-04-05 DIAGNOSIS — M47816 Spondylosis without myelopathy or radiculopathy, lumbar region: Secondary | ICD-10-CM | POA: Diagnosis not present

## 2021-04-05 DIAGNOSIS — Z23 Encounter for immunization: Secondary | ICD-10-CM

## 2021-04-05 DIAGNOSIS — G5701 Lesion of sciatic nerve, right lower limb: Secondary | ICD-10-CM | POA: Insufficient documentation

## 2021-04-05 DIAGNOSIS — I10 Essential (primary) hypertension: Secondary | ICD-10-CM

## 2021-04-05 DIAGNOSIS — D72819 Decreased white blood cell count, unspecified: Secondary | ICD-10-CM

## 2021-04-05 DIAGNOSIS — M25551 Pain in right hip: Secondary | ICD-10-CM | POA: Insufficient documentation

## 2021-04-05 DIAGNOSIS — R000 Tachycardia, unspecified: Secondary | ICD-10-CM | POA: Diagnosis not present

## 2021-04-05 DIAGNOSIS — M5416 Radiculopathy, lumbar region: Secondary | ICD-10-CM

## 2021-04-05 DIAGNOSIS — J309 Allergic rhinitis, unspecified: Secondary | ICD-10-CM | POA: Diagnosis not present

## 2021-04-05 DIAGNOSIS — E2839 Other primary ovarian failure: Secondary | ICD-10-CM

## 2021-04-05 LAB — CBC WITH DIFFERENTIAL/PLATELET
Basophils Absolute: 0 10*3/uL (ref 0.0–0.1)
Basophils Relative: 0.9 % (ref 0.0–3.0)
Eosinophils Absolute: 0.1 10*3/uL (ref 0.0–0.7)
Eosinophils Relative: 2.1 % (ref 0.0–5.0)
HCT: 40.1 % (ref 36.0–46.0)
Hemoglobin: 13.5 g/dL (ref 12.0–15.0)
Lymphocytes Relative: 48 % — ABNORMAL HIGH (ref 12.0–46.0)
Lymphs Abs: 1.5 10*3/uL (ref 0.7–4.0)
MCHC: 33.8 g/dL (ref 30.0–36.0)
MCV: 94.6 fl (ref 78.0–100.0)
Monocytes Absolute: 0.2 10*3/uL (ref 0.1–1.0)
Monocytes Relative: 7.5 % (ref 3.0–12.0)
Neutro Abs: 1.3 10*3/uL — ABNORMAL LOW (ref 1.4–7.7)
Neutrophils Relative %: 41.5 % — ABNORMAL LOW (ref 43.0–77.0)
Platelets: 285 10*3/uL (ref 150.0–400.0)
RBC: 4.24 Mil/uL (ref 3.87–5.11)
RDW: 13.4 % (ref 11.5–15.5)
WBC: 3 10*3/uL — ABNORMAL LOW (ref 4.0–10.5)

## 2021-04-05 LAB — LIPID PANEL
Cholesterol: 182 mg/dL (ref 0–200)
HDL: 80.3 mg/dL (ref >39.00)
LDL Cholesterol: 84 mg/dL (ref 0–99)
NonHDL: 102.12
Total CHOL/HDL Ratio: 2
Triglycerides: 89 mg/dL (ref 0.0–149.0)
VLDL: 17.8 mg/dL (ref 0.0–40.0)

## 2021-04-05 LAB — HEMOGLOBIN A1C: Hgb A1c MFr Bld: 6 % (ref 4.6–6.5)

## 2021-04-05 LAB — COMPREHENSIVE METABOLIC PANEL
ALT: 23 U/L (ref 0–35)
AST: 22 U/L (ref 0–37)
Albumin: 4.6 g/dL (ref 3.5–5.2)
Alkaline Phosphatase: 94 U/L (ref 39–117)
BUN: 14 mg/dL (ref 6–23)
CO2: 27 mEq/L (ref 19–32)
Calcium: 10.1 mg/dL (ref 8.4–10.5)
Chloride: 104 mEq/L (ref 96–112)
Creatinine, Ser: 0.89 mg/dL (ref 0.40–1.20)
GFR: 66.15 mL/min (ref >60.00)
Glucose, Bld: 93 mg/dL (ref 70–99)
Potassium: 4.5 mEq/L (ref 3.5–5.1)
Sodium: 140 mEq/L (ref 135–145)
Total Bilirubin: 0.9 mg/dL (ref 0.2–1.2)
Total Protein: 7.7 g/dL (ref 6.0–8.3)

## 2021-04-05 MED ORDER — LOSARTAN POTASSIUM 100 MG PO TABS: 100.0000 mg | ORAL_TABLET | Freq: Every day | ORAL | 3 refills | Status: DC

## 2021-04-05 MED ORDER — LORATADINE 10 MG PO TABS: ORAL_TABLET | ORAL | 3 refills | Status: DC

## 2021-04-05 MED ORDER — TETANUS-DIPHTH-ACELL PERTUSSIS 5-2.5-18.5 LF-MCG/0.5 IM SUSP: 0.5000 mL | Freq: Once | INTRAMUSCULAR | 0 refills | Status: AC

## 2021-04-05 MED ORDER — NEBIVOLOL HCL 2.5 MG PO TABS: 2.5000 mg | ORAL_TABLET | Freq: Every day | ORAL | 3 refills | Status: DC

## 2021-04-05 NOTE — Patient Instructions (Addendum)
Consider 4 total moderna doses  Piriformis Syndrome Piriformis syndrome is a condition that can cause pain and numbness in your buttocks and down the back of your leg. Piriformis syndrome happens when the small muscle that connects the base of your spine to your hip (piriformis muscle) presses on the nerve that runs down the back of your leg (sciatic nerve). The piriformis muscle helps your hip rotate and helps to bring your leg back and out. It also helps shift your weight to keep you stable while you are walking. The sciatic nerve runs under or through the piriformis muscle. Damage to the piriformis muscle can cause spasms that put pressure on the nerve below. This causes pain and discomfort while sitting and moving. The pain may feel as if it begins in the buttock and spreads (radiates) down your hip and thigh. What are the causes? This condition is caused by pressure on the sciatic nerve from the piriformis muscle. The piriformis muscle can get irritated with overuse, especially if other hip muscles are weak and the piriformis muscle has to do extra work. Piriformis syndrome can also occur after an injury, like a fall onto your buttocks. What increases the risk? You are more likely to develop this condition if you: Are a woman. Sit for long periods of time. Are a cyclist. Have weak buttocks muscles (gluteal muscles). What are the signs or symptoms? Symptoms of this condition include: Pain, tingling, or numbness that starts in the buttock and runs down the back of your leg (sciatica). Pain in the groin or thigh area. Your symptoms may get worse: The longer you sit. When you walk, run, or climb stairs. When straining to have a bowel movement. How is this diagnosed? This condition is diagnosed based on your symptoms, medical history, and physical exam. During the exam, your health care provider may: Move your leg into different positions to check for pain. Press on the muscles of your hip  and buttock to see if that increases your symptoms. You may also have tests, including: Imaging tests such as X-rays, CT, MRI, or ultrasound. Electromyogram (EMG). This test measures electrical signals sent by your nerves into the muscles. Nerve conduction study. This test measures how well electrical signals pass through your nerves. How is this treated? This condition may be treated by: Stopping all activities that cause pain or make your condition worse. Applying ice or using heat therapy. Taking medicines to reduce pain and swelling. Taking a muscle relaxer (muscle relaxant) to stop muscle spasms. Doing range-of-motion and strengthening exercises (physical therapy) as told by your health care provider. Having massage, acupuncture, or local electrical stimulation (transcutaneous electrical nerve stimulation, TENS). Getting an injection of medicine in the piriformis muscle. Your health care provider will choose the medicine based on your condition. He or she may inject: An anti-inflammatory medicine (steroid) to reduce swelling. A numbing medicine (local anesthetic) to block the pain. Botulinum toxin. The toxin blocks nerve impulses to specific muscles to reduce muscle tension. In rare cases, you may need surgery to cut the muscle and release pressure on the nerve if other treatments do not work. Follow these instructions at home: Activity Do not sit for long periods. Get up and walk around every 20 minutes or as often as told by your health care provider. When driving long distances, make sure to take frequent stops to get up and stretch. Use a cushion when you sit on hard surfaces. Do exercises as told by your health care provider. Return  to your normal activities as told by your health care provider. Ask your health care provider what activities are safe for you. Managing pain, stiffness, and swelling   If directed, apply heat to the area as often as told by your health care provider.  Use the heat source that your health care provider recommends, such as a moist heat pack or a heating pad. Place a towel between your skin and the heat source. Leave the heat on for 20-30 minutes. Remove the heat if your skin turns bright red. This is especially important if you are unable to feel pain, heat, or cold. You have a greater risk of getting burned. If directed, put ice on the injured area. To do this: Put ice in a plastic bag. Place a towel between your skin and the bag. Leave the ice on for 20 minutes, 2-3 times a day. Remove the ice if your skin turns bright red. This is very important. If you cannot feel pain, heat, or cold, you have a greater risk of damage to the area. General instructions Take over-the-counter and prescription medicines only as told by your health care provider. Ask your health care provider if the medicine prescribed to you requires you to avoid driving or using machinery. You may need to take these actions to prevent or treat constipation: Drink enough fluid to keep your urine pale yellow. Take over-the-counter or prescription medicines. Eat foods that are high in fiber, such as beans, whole grains, and fresh fruits and vegetables. Limit foods that are high in fat and processed sugars, such as fried or sweet foods. Keep all follow-up visits. This is important. How is this prevented? Do not sit for longer than 20 minutes at a time. When you sit, choose padded surfaces. Warm up and stretch before being active. Cool down and stretch after being active. Contact a health care provider if: Your pain and stiffness continue or get worse. Your leg or hip becomes weak. You have changes in your bowel function or bladder function. Summary Piriformis syndrome is a condition that can cause pain, tingling, and numbness in your buttocks and down the back of your leg. You may try applying heat or ice to relieve the pain. Do not sit for long periods. Get up and walk  around every 20 minutes or as often as told by your health care provider. This information is not intended to replace advice given to you by your health care provider. Make sure you discuss any questions you have with your health care provider. Document Revised: 10/02/2020 Document Reviewed: 10/02/2020 Elsevier Patient Education  [place].  Piriformis Syndrome Rehab Ask your health care provider which exercises are safe for you. Do exercises exactly as told by your health care provider and adjust them as directed. It is normal to feel mild stretching, pulling, tightness, or discomfort as you do these exercises. Stop right away if you feel sudden pain or your pain gets worse. Do not begin these exercises until told by your health care provider. Stretching and range-of-motion exercises These exercises warm up your muscles and joints and improve the movement and flexibility of your hip and pelvis. The exercises also help to relieve pain, numbness, and tingling. Nerve root  Sit on a firm surface that is high enough that you can swing your left / right foot freely. Place a folded towel under your left / right thigh. This is optional. Drop your head forward and round your back. While you keep your left /  right foot relaxed, slowly straighten your left / right knee until you feel a slight pull behind your knee or calf. If your leg is fully extended and you still do not feel a pull, slowly tilt your foot and toes toward you. Hold this position for __________ seconds. Slowly return your knee to its starting position. Hip rotation This is an exercise in which you lie on your back and stretch the muscles that rotate your hip (hip rotators) to stretch your buttocks. Lie on your back on a firm surface. Pull your left / right knee toward your same shoulder with your left / right hand until your knee is pointing toward the ceiling. Hold your left / right ankle with your other hand. Keeping your knee  steady, gently pull your left / right ankle toward your other shoulder until you feel a stretch in your buttocks. Hold this position for __________ seconds. Repeat __________ times. Complete this exercise __________ times a day. Hip extensor This is an exercise in which you lie on your back and pull your knee to your chest. Lie on your back on a firm surface. Both of your legs should be straight. Pull your left / right knee to your chest. Hold your leg in this position by holding on to the back of your thigh or the front of your knee. Hold this position for __________ seconds. Slowly return to the starting position. Repeat __________ times. Complete this exercise __________ times a day. Strengthening exercises These exercises build strength and endurance in your hip and thigh muscles. Endurance is the ability to use your muscles for a long time, even after they get tired. Straight leg raises, side-lying This exercise strengthens the muscles that rotate the leg at the hip and move it away from your body (hip abductors). Lie on your side with your left / right leg in the top position. Lie so your head, shoulder, knee, and hip line up. Bend your bottom knee to help you balance. Lift your top leg 4-6 inches (10-15 cm) while keeping your toes pointed straight ahead. Hold this position for __________ seconds. Slowly lower your leg to the starting position. Let your muscles relax completely after each repetition. Repeat __________ times. Complete this exercise __________ times a day. Hip abduction and rotation This is sometimes called quadruped (on hands and knees) exercises. Get on your hands and knees on a firm, lightly padded surface. Your hands should be directly below your shoulders, and your knees should be directly below your hips. Lift your left / right knee out to the side. Keep your knee bent. Do not twist your body. Hold this position for __________ seconds. Slowly lower your  leg. Repeat __________ times. Complete this exercise __________ times a day. Straight leg raises, prone This exercise stretches the muscles that move the hips (hip extensors). Lie on your abdomen on a firm surface (prone position). Tense the muscles in your buttocks and lift your left / right leg about 4 inches (10 cm). Keep your knee straight as you lift your leg. If you cannot lift your leg that high without arching your back, place a pillow under your hips. Hold this position for __________ seconds. Slowly lower your leg to the starting position. Let your muscles relax completely after each repetition. Repeat __________ times. Complete this exercise __________ times a day. This information is not intended to replace advice given to you by your health care provider. Make sure you discuss any questions you have with your health  care provider. Document Revised: 10/10/2020 Document Reviewed: 10/10/2020 Elsevier Patient Education  [place].

## 2021-04-05 NOTE — Progress Notes (Signed)
Chief Complaint  Patient presents with   Follow-up   F/u  1. Grief will start counseling next well due to husband of 32 years passin 2022  2. Abnormal MRI, lumbar radiculopathy right hip pain [facility] had MRI lumbar 03/28/21 lumbar radiculopathy L4, facet arthritis mild to severe multiple levels tried ibuprofen and zanaflex ? Dose and prednisne and better right hip 2/10 pain today  3. Htn on losartan 100 mg qd did not pick up bystolic 2.5 mg qd    Review of Systems  Constitutional:  Negative for weight loss.  HENT:  Negative for hearing loss.   Eyes:  Negative for blurred vision.  Respiratory:  Negative for shortness of breath.   Cardiovascular:  Negative for chest pain.  Gastrointestinal:  Negative for abdominal pain and blood in stool.  Genitourinary:  Negative for dysuria.  Musculoskeletal:  Negative for falls and joint pain.  Skin:  Negative for rash.  Neurological:  Negative for headaches.  Psychiatric/Behavioral:  Negative for depression.   Past Medical History:  Diagnosis Date   Hypertension    Prediabetes    Past Surgical History:  Procedure Laterality Date   CESAREAN SECTION     FEMUR SURGERY     left, s/p rod for fracture   TUBAL LIGATION     Family History  Problem Relation Age of Onset   Diabetes Mother    Hypertension Mother    Heart disease Mother    Cancer Mother        breast and stomach   Breast cancer Mother 70   Diabetes Father    Hypertension Father    Heart disease Father    Kidney disease Father    Social History   Socioeconomic History   Marital status: Married    Spouse name: Not on file   Number of children: Not on file   Years of education: Not on file   Highest education level: Not on file  Occupational History   Not on file  Tobacco Use   Smoking status: Never   Smokeless tobacco: Never  Substance and Sexual Activity   Alcohol use: No   Drug use: Not on file   Sexual activity: Not on file  Other Topics Concern    Not on file  Social History Narrative   Lives in [place] but husband died Jun 08, 2020 motorcycle accident bday 10/19/20 married 22 years   No pets   2 daughters and 3 granddaughters      Work - [employer] retired 09/2019   Diet - regular diet   Exercise - none   Social Determinants of Radio broadcast assistant Strain: Low Risk    Difficulty of Paying Living Expenses: Not hard at all  Food Insecurity: No Food Insecurity   Worried About Charity fundraiser in the Last Year: Never true   Arboriculturist in the Last Year: Never true  Transportation Needs: No Transportation Needs   Lack of Transportation (Medical): No   Lack of Transportation (Non-Medical): No  Physical Activity: Not on file  Stress: No Stress Concern Present   Feeling of Stress : Not at all  Social Connections: Unknown   Frequency of Communication with Friends and Family: More than three times a week   Frequency of Social Gatherings with Friends and Family: Not on file   Attends Religious Services: Not on file   Active Member of Clubs or Organizations: Not on file   Attends Archivist Meetings:  Not on file   Marital Status: Not on file  Intimate Partner Violence: Not At Risk   Fear of Current or Ex-Partner: No   Emotionally Abused: No   Physically Abused: No   Sexually Abused: No   Current Meds  Medication Sig   BLACK CURRANT SEED OIL PO Take by mouth.   Chromium Picolinate (CHROMIUM PICOLATE PO) Take by mouth daily.   fluticasone (FLONASE) 50 MCG/ACT nasal spray USE TWO SPRAYS IN EACH NOSTRIL ONCE DAILY prn   Multiple Vitamin (ONE-DAILY MULTI-VITAMIN PO) Take by mouth daily.   Omega-3 Fatty Acids (FISH OIL) 1000 MG CAPS Take by mouth.   Tdap (BOOSTRIX) 5-2.5-18.5 LF-MCG/0.5 injection Inject 0.5 mLs into the muscle once for 1 dose.   [DISCONTINUED] loratadine (CLARITIN) 10 MG tablet TAKE ONE TABLET BY MOUTH DAILY AS NEEDED FOR ALLERGIES   [DISCONTINUED] losartan (COZAAR) 100 MG tablet TAKE ONE  TABLET BY MOUTH DAILY   Allergies  Allergen Reactions   Diclofenac Hives   Lisinopril     cough   Sulfa Antibiotics     ? Allergy    No results found for this or any previous visit (from the past 2160 hour(s)). Objective  Body mass index is 29.65 kg/m. Wt Readings from Last 3 Encounters:  04/05/21 151 lb (68.5 kg)  12/19/20 148 lb (67.1 kg)  10/03/20 148 lb 9.6 oz (67.4 kg)   Temp Readings from Last 3 Encounters:  04/05/21 (!) 97.2 F (36.2 C) (Temporal)  10/03/20 98.2 F (36.8 C) (Oral)  09/30/19 (!) 97.5 F (36.4 C) (Temporal)   BP Readings from Last 3 Encounters:  04/05/21 128/86  12/19/20 129/86  10/03/20 (!) 150/80   Pulse Readings from Last 3 Encounters:  04/05/21 92  12/19/20 85  10/03/20 (!) 123    Physical Exam Vitals and nursing note reviewed.  Constitutional:      Appearance: Normal appearance. She is well-developed and well-groomed.  HENT:     Head: Normocephalic and atraumatic.  Eyes:     Conjunctiva/sclera: Conjunctivae normal.     Pupils: Pupils are equal, round, and reactive to light.  Cardiovascular:     Rate and Rhythm: Normal rate and regular rhythm.     Heart sounds: Normal heart sounds. No murmur heard. Pulmonary:     Effort: Pulmonary effort is normal.     Breath sounds: Normal breath sounds.  Abdominal:     General: Abdomen is flat. Bowel sounds are normal.     Tenderness: There is no abdominal tenderness.  Musculoskeletal:        General: No tenderness.  Skin:    General: Skin is warm and dry.  Neurological:     General: No focal deficit present.     Mental Status: She is alert and oriented to person, place, and time. Mental status is at baseline.     Cranial Nerves: Cranial nerves 2-12 are intact.     Gait: Gait is intact.  Psychiatric:        Attention and Perception: Attention and perception normal.        Mood and Affect: Mood and affect normal.        Speech: Speech normal.        Behavior: Behavior normal. Behavior is  cooperative.        Thought Content: Thought content normal.        Cognition and Memory: Cognition and memory normal.        Judgment: Judgment normal.    Assessment  Plan  Essential hypertension - Plan: Comprehensive metabolic panel, Lipid panel, CBC with Differential/Platelet, losartan (COZAAR) 790 MG tablet Bystolic 2.5 mg qd   Leukopenia, unspecified type - Plan: CBC with Differential/Platelet, Pathologist smear review  Prediabetes - Plan: Hemoglobin A1c Healthy diet and exercise   Sinus tachycardia - Plan: nebivolol (BYSTOLIC) 2.5 MG tablet  Right hip pain - Plan: Ambulatory referral to Physical Therapy, CANCELED: Ambulatory referral to Physical Therapy [facility]   Abnormal MRI, lumbar spine - Plan: Ambulatory referral to Physical Therapy,   Lumbar facet arthropathy - Plan: Ambulatory referral to Physical Therapy,   Lumbar radiculopathy - Plan: Ambulatory referral to Physical Therapy,  Piriformis syndrome, right - Plan: Ambulatory referral to Physical Therapy  Allergic rhinitis, unspecified seasonality, unspecified trigger - Plan: loratadine (CLARITIN) 10 MG tablet  Grief  Counseling will start next week   HM Declines vaccines will redisc Tdap in future rx given 10/03/20, 04/04/21  covid had 2/2 consider booster declines as of 04/04/21 consider 2 more shots   Out of age window pap had 10/04/16 normal no HPV no h/o abnormal  Colonoscopy had 07/23/13 Dr. Tiffany Kocher hyperplastic polyp repeat in 10 years   mammo 10/2017 oil cyst right breast repeat dx mammo in 6 months -re ordered mammo 11/20/18 had 05/22/18 rec b/l diag in 6 months right breast cyst  -mammogram 12/11/20 negative    DEXA 07/04/09 normal  Declines STD check, hep B Hep C neg 10/04/16 rec healthy diet choices and exercise  Provider: Dr. Olivia Mackie McLean-Scocuzza-[facility]

## 2021-04-06 LAB — PATHOLOGIST SMEAR REVIEW

## 2021-04-09 ENCOUNTER — Encounter: Payer: Self-pay | Admitting: Internal Medicine

## 2021-04-09 NOTE — Telephone Encounter (Signed)
-----   Message from Delorise Jackson, MD sent at 04/06/2021  6:37 PM EST ----- +prediabetes  Liver kidneys normal  Cholesterol normal  White blood cell ct low stable for years does not appear worrisome

## 2021-04-20 ENCOUNTER — Telehealth: Payer: PPO | Admitting: Physician Assistant

## 2021-04-20 DIAGNOSIS — U071 COVID-19: Secondary | ICD-10-CM | POA: Diagnosis not present

## 2021-04-20 MED ORDER — NIRMATRELVIR/RITONAVIR (PAXLOVID)TABLET: ORAL_TABLET | ORAL | 0 refills | Status: DC

## 2021-04-20 NOTE — Progress Notes (Signed)
Ms. denis, koppel are scheduled for a virtual visit with your provider today.    Just as we do with appointments in the office, we must obtain your consent to participate.  Your consent will be active for this visit and any virtual visit you may have with one of our providers in the next 365 days.    If you have a MyChart account, I can also send a copy of this consent to you electronically.  All virtual visits are billed to your insurance company just like a traditional visit in the office.  As this is a virtual visit, video technology does not allow for your provider to perform a traditional examination.  This may limit your provider's ability to fully assess your condition.  If your provider identifies any concerns that need to be evaluated in person or the need to arrange testing such as labs, EKG, etc, we will make arrangements to do so.    Although advances in technology are sophisticated, we cannot ensure that it will always work on either your end or our end.  If the connection with a video visit is poor, we may have to switch to a telephone visit.  With either a video or telephone visit, we are not always able to ensure that we have a secure connection.   I need to obtain your verbal consent now.   Are you willing to proceed with your visit today?   Victoria Foley has provided verbal consent on 04/20/2021 for a virtual visit (video or telephone).   Abigail Butts, PA-C 04/20/2021  1:04 PM   Date:  04/20/2021   ID:  Victoria Foley, DOB 03/26/1951, MRN [id number]  Patient Location: Home Provider Location: Home Office   Participants: Patient and Provider for Visit and Wrap up  Method of visit: Video  Location of Patient: Home Location of Provider: Home Office Consent was obtain for visit over the video. Services rendered by provider: Visit was performed via video  A video enabled telemedicine application was used and I verified that I am speaking with the correct person  using two identifiers.  PCP:  McLean-Scocuzza, Nino Glow, MD   Chief Complaint:  COVID +  History of Present Illness:    Victoria Foley is a 70 y.o. female with history as stated below. Presents video telehealth for an acute care visit.  Pt reports sinus pressure, congestion and headache.  Pt reports dry cough started 3 days ago. No fevers, chills, N/V, chest pain.  Denies SOB.  Pt taking Nyquil with some relief.  Nothing makes the symptoms worse.  Pt reports vaccinated for COVID, not boosted.    Denies previous COVID infection.   Medication - losartan, vitamin supplements, probiotic - pt not taking Bystolic  Records reviewed - creatinine normal in Dec 2022.    Past Medical, Surgical, Social History, Allergies, and Medications have been Reviewed.  Patient Active Problem List   Diagnosis Date Noted   Abnormal MRI, lumbar spine 04/05/2021   Lumbar facet arthropathy 04/05/2021   Lumbar radiculopathy 04/05/2021   Right hip pain 04/05/2021   Piriformis syndrome, right 04/05/2021   Cataract of left eye 04/05/2021   Age-related cataract of left eye 10/03/2020   Sinus tachycardia 10/03/2020   Tinnitus 07/21/2019   Left foot pain 01/13/2018   Other neutropenia ([place]) 10/22/2017   Dermatitis 10/10/2017   Leukopenia 10/10/2017   Prediabetes 10/10/2017   Annual physical exam 10/04/2016   Emphysema 11/01/2011   Essential hypertension 10/10/2011  Social History   Tobacco Use   Smoking status: Never   Smokeless tobacco: Never  Substance Use Topics   Alcohol use: No     Current Outpatient Medications:    nirmatrelvir/ritonavir EUA (PAXLOVID) 20 x 150 MG & 10 x 100MG  TABS, Take nirmatrelvir (150 mg) two tablets twice daily for 5 days and ritonavir (100 mg) one tablet twice daily for 5 days., Disp: 30 tablet, Rfl: 0   BLACK CURRANT SEED OIL PO, Take by mouth., Disp: , Rfl:    Chromium Picolinate (CHROMIUM PICOLATE PO), Take by mouth daily., Disp: , Rfl:    fluticasone  (FLONASE) 50 MCG/ACT nasal spray, USE TWO SPRAYS IN EACH NOSTRIL ONCE DAILY prn, Disp: 16 g, Rfl: 12   loratadine (CLARITIN) 10 MG tablet, TAKE ONE TABLET BY MOUTH DAILY AS NEEDED FOR ALLERGIES, Disp: 90 tablet, Rfl: 3   losartan (COZAAR) 100 MG tablet, Take 1 tablet (100 mg total) by mouth daily., Disp: 90 tablet, Rfl: 3   Multiple Vitamin (ONE-DAILY MULTI-VITAMIN PO), Take by mouth daily., Disp: , Rfl:    nebivolol (BYSTOLIC) 2.5 MG tablet, Take 1 tablet (2.5 mg total) by mouth daily., Disp: 90 tablet, Rfl: 3   Omega-3 Fatty Acids (FISH OIL) 1000 MG CAPS, Take by mouth., Disp: , Rfl:    Allergies  Allergen Reactions   Diclofenac Hives   Lisinopril     cough   Sulfa Antibiotics     ? Allergy      Review of Systems  Constitutional:  Negative for chills and fever.  HENT:  Positive for congestion. Negative for ear pain and sore throat.   Eyes:  Negative for blurred vision and double vision.  Respiratory:  Positive for cough. Negative for shortness of breath and wheezing.   Cardiovascular:  Negative for chest pain, palpitations and leg swelling.  Gastrointestinal:  Negative for abdominal pain, diarrhea, nausea and vomiting.  Genitourinary:  Negative for dysuria.  Musculoskeletal:  Negative for myalgias.  Skin:  Negative for rash.  Neurological:  Positive for headaches. Negative for loss of consciousness and weakness.  Psychiatric/Behavioral:  The patient is not nervous/anxious.   See HPI for history of present illness.  Physical Exam Constitutional:      General: She is not in acute distress.    Appearance: Normal appearance. She is not ill-appearing.  HENT:     Head: Normocephalic and atraumatic.     Nose: No congestion.  Eyes:     Extraocular Movements: Extraocular movements intact.  Pulmonary:     Effort: Pulmonary effort is normal.     Comments: Dry cough Speaks in full sentences Musculoskeletal:        General: Normal range of motion.     Cervical back: Normal range of  motion.  Skin:    Coloration: Skin is not pale.  Neurological:     General: No focal deficit present.     Mental Status: She is alert. Mental status is at baseline.  Psychiatric:        Mood and Affect: Mood normal.              A&P  1. COVID-19  - Paxlovid  - Continue NyQuil and other symptomatic therapy  -Rest, push fluids  -Go to the emergency department for worsening symptoms, shortness of breath or other concerns.   Patient voiced understanding and agreement to plan.   Time:   Today, I have spent 15 minutes with the patient with telehealth technology discussing the above problems,  reviewing the chart, previous notes, medications and orders.    Tests Ordered: No orders of the defined types were placed in this encounter.   Medication Changes: Meds ordered this encounter  Medications   nirmatrelvir/ritonavir EUA (PAXLOVID) 20 x 150 MG & 10 x 100MG  TABS    Sig: Take nirmatrelvir (150 mg) two tablets twice daily for 5 days and ritonavir (100 mg) one tablet twice daily for 5 days.    Dispense:  30 tablet    Refill:  0     Disposition:  Follow up ER for worsening symptoms  Signed, Abigail Butts, PA-C  04/20/2021 1:11 PM

## 2021-04-20 NOTE — Patient Instructions (Signed)
1. COVID-19  - Paxlovid  - Continue NyQuil and other symptomatic therapy  -Rest, push fluids  -Go to the emergency department for worsening symptoms, shortness of breath or other concerns.

## 2021-05-02 DIAGNOSIS — M25551 Pain in right hip: Secondary | ICD-10-CM | POA: Diagnosis not present

## 2021-05-02 DIAGNOSIS — M4726 Other spondylosis with radiculopathy, lumbar region: Secondary | ICD-10-CM | POA: Diagnosis not present

## 2021-05-04 DIAGNOSIS — M4726 Other spondylosis with radiculopathy, lumbar region: Secondary | ICD-10-CM | POA: Diagnosis not present

## 2021-05-04 DIAGNOSIS — M25551 Pain in right hip: Secondary | ICD-10-CM | POA: Diagnosis not present

## 2021-05-09 DIAGNOSIS — M4726 Other spondylosis with radiculopathy, lumbar region: Secondary | ICD-10-CM | POA: Diagnosis not present

## 2021-05-09 DIAGNOSIS — M25551 Pain in right hip: Secondary | ICD-10-CM | POA: Diagnosis not present

## 2021-05-11 DIAGNOSIS — M4726 Other spondylosis with radiculopathy, lumbar region: Secondary | ICD-10-CM | POA: Diagnosis not present

## 2021-05-11 DIAGNOSIS — M25551 Pain in right hip: Secondary | ICD-10-CM | POA: Diagnosis not present

## 2021-05-14 DIAGNOSIS — M25551 Pain in right hip: Secondary | ICD-10-CM | POA: Diagnosis not present

## 2021-05-14 DIAGNOSIS — M4726 Other spondylosis with radiculopathy, lumbar region: Secondary | ICD-10-CM | POA: Diagnosis not present

## 2021-05-17 DIAGNOSIS — M4726 Other spondylosis with radiculopathy, lumbar region: Secondary | ICD-10-CM | POA: Diagnosis not present

## 2021-05-17 DIAGNOSIS — M25551 Pain in right hip: Secondary | ICD-10-CM | POA: Diagnosis not present

## 2021-05-21 DIAGNOSIS — M4726 Other spondylosis with radiculopathy, lumbar region: Secondary | ICD-10-CM | POA: Diagnosis not present

## 2021-05-21 DIAGNOSIS — M25551 Pain in right hip: Secondary | ICD-10-CM | POA: Diagnosis not present

## 2021-05-24 DIAGNOSIS — M4726 Other spondylosis with radiculopathy, lumbar region: Secondary | ICD-10-CM | POA: Diagnosis not present

## 2021-05-24 DIAGNOSIS — M25551 Pain in right hip: Secondary | ICD-10-CM | POA: Diagnosis not present

## 2021-05-28 DIAGNOSIS — M4726 Other spondylosis with radiculopathy, lumbar region: Secondary | ICD-10-CM | POA: Diagnosis not present

## 2021-05-28 DIAGNOSIS — M25551 Pain in right hip: Secondary | ICD-10-CM | POA: Diagnosis not present

## 2021-05-30 DIAGNOSIS — M25551 Pain in right hip: Secondary | ICD-10-CM | POA: Diagnosis not present

## 2021-05-30 DIAGNOSIS — M4726 Other spondylosis with radiculopathy, lumbar region: Secondary | ICD-10-CM | POA: Diagnosis not present

## 2021-08-10 ENCOUNTER — Other Ambulatory Visit: Payer: Self-pay | Admitting: Internal Medicine

## 2021-08-10 DIAGNOSIS — I10 Essential (primary) hypertension: Secondary | ICD-10-CM

## 2021-08-14 DIAGNOSIS — M5416 Radiculopathy, lumbar region: Secondary | ICD-10-CM | POA: Diagnosis not present

## 2021-08-14 DIAGNOSIS — M25551 Pain in right hip: Secondary | ICD-10-CM | POA: Diagnosis not present

## 2021-09-07 ENCOUNTER — Ambulatory Visit (INDEPENDENT_AMBULATORY_CARE_PROVIDER_SITE_OTHER): Payer: PPO

## 2021-09-07 ENCOUNTER — Ambulatory Visit
Admission: EM | Admit: 2021-09-07 | Discharge: 2021-09-07 | Disposition: A | Discharge status: Home / Self Care | Payer: PPO | Attending: Emergency Medicine | Admitting: Emergency Medicine

## 2021-09-07 ENCOUNTER — Encounter: Payer: Self-pay | Admitting: Emergency Medicine

## 2021-09-07 DIAGNOSIS — M25562 Pain in left knee: Secondary | ICD-10-CM | POA: Diagnosis not present

## 2021-09-07 DIAGNOSIS — M1712 Unilateral primary osteoarthritis, left knee: Secondary | ICD-10-CM | POA: Diagnosis not present

## 2021-09-07 DIAGNOSIS — M7989 Other specified soft tissue disorders: Secondary | ICD-10-CM | POA: Diagnosis not present

## 2021-09-07 DIAGNOSIS — M25572 Pain in left ankle and joints of left foot: Secondary | ICD-10-CM | POA: Diagnosis not present

## 2021-09-07 HISTORY — DX: Sciatica, unspecified side: M54.30
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
## 2021-09-07 NOTE — Discharge Instructions (Addendum)
I will call you with your x-ray results.  Take Tylenol or ibuprofen as needed for discomfort.  Follow-up with orthopedics if your symptoms are not improving.
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
## 2021-09-12 DIAGNOSIS — M25551 Pain in right hip: Secondary | ICD-10-CM | POA: Diagnosis not present

## 2021-09-26 DIAGNOSIS — M5416 Radiculopathy, lumbar region: Secondary | ICD-10-CM | POA: Diagnosis not present

## 2021-10-04 ENCOUNTER — Telehealth: Payer: Self-pay | Admitting: Internal Medicine

## 2021-10-04 ENCOUNTER — Encounter: Payer: Self-pay | Admitting: Internal Medicine

## 2021-10-04 ENCOUNTER — Ambulatory Visit (INDEPENDENT_AMBULATORY_CARE_PROVIDER_SITE_OTHER): Payer: PPO | Admitting: Internal Medicine

## 2021-10-04 ENCOUNTER — Ambulatory Visit (INDEPENDENT_AMBULATORY_CARE_PROVIDER_SITE_OTHER): Payer: PPO

## 2021-10-04 VITALS — BP 140/90 | HR 100 | Temp 98.6°F | Resp 14 | Ht 59.84 in | Wt 149.4 lb

## 2021-10-04 DIAGNOSIS — J309 Allergic rhinitis, unspecified: Secondary | ICD-10-CM | POA: Diagnosis not present

## 2021-10-04 DIAGNOSIS — I10 Essential (primary) hypertension: Secondary | ICD-10-CM | POA: Diagnosis not present

## 2021-10-04 DIAGNOSIS — R5383 Other fatigue: Secondary | ICD-10-CM

## 2021-10-04 DIAGNOSIS — M5416 Radiculopathy, lumbar region: Secondary | ICD-10-CM | POA: Diagnosis not present

## 2021-10-04 DIAGNOSIS — R7303 Prediabetes: Secondary | ICD-10-CM | POA: Diagnosis not present

## 2021-10-04 DIAGNOSIS — M25561 Pain in right knee: Secondary | ICD-10-CM | POA: Diagnosis not present

## 2021-10-04 DIAGNOSIS — M25551 Pain in right hip: Secondary | ICD-10-CM

## 2021-10-04 DIAGNOSIS — J9801 Acute bronchospasm: Secondary | ICD-10-CM

## 2021-10-04 DIAGNOSIS — M47814 Spondylosis without myelopathy or radiculopathy, thoracic region: Secondary | ICD-10-CM

## 2021-10-04 DIAGNOSIS — N281 Cyst of kidney, acquired: Secondary | ICD-10-CM

## 2021-10-04 DIAGNOSIS — R911 Solitary pulmonary nodule: Secondary | ICD-10-CM

## 2021-10-04 DIAGNOSIS — Z1389 Encounter for screening for other disorder: Secondary | ICD-10-CM | POA: Diagnosis not present

## 2021-10-04 DIAGNOSIS — R000 Tachycardia, unspecified: Secondary | ICD-10-CM

## 2021-10-04 DIAGNOSIS — Z1231 Encounter for screening mammogram for malignant neoplasm of breast: Secondary | ICD-10-CM | POA: Diagnosis not present

## 2021-10-04 DIAGNOSIS — R748 Abnormal levels of other serum enzymes: Secondary | ICD-10-CM

## 2021-10-04 DIAGNOSIS — Z1329 Encounter for screening for other suspected endocrine disorder: Secondary | ICD-10-CM

## 2021-10-04 DIAGNOSIS — C3490 Malignant neoplasm of unspecified part of unspecified bronchus or lung: Secondary | ICD-10-CM

## 2021-10-04 DIAGNOSIS — R0982 Postnasal drip: Secondary | ICD-10-CM | POA: Diagnosis not present

## 2021-10-04 DIAGNOSIS — R062 Wheezing: Secondary | ICD-10-CM | POA: Diagnosis not present

## 2021-10-04 LAB — CBC WITH DIFFERENTIAL/PLATELET
Basophils Absolute: 0 10*3/uL (ref 0.0–0.1)
Basophils Relative: 0.6 % (ref 0.0–3.0)
Eosinophils Absolute: 0.1 10*3/uL (ref 0.0–0.7)
Eosinophils Relative: 1.8 % (ref 0.0–5.0)
HCT: 39.1 % (ref 36.0–46.0)
Hemoglobin: 12.9 g/dL (ref 12.0–15.0)
Lymphocytes Relative: 36.4 % (ref 12.0–46.0)
Lymphs Abs: 1.6 10*3/uL (ref 0.7–4.0)
MCHC: 33 g/dL (ref 30.0–36.0)
MCV: 95 fl (ref 78.0–100.0)
Monocytes Absolute: 0.3 10*3/uL (ref 0.1–1.0)
Monocytes Relative: 6.9 % (ref 3.0–12.0)
Neutro Abs: 2.3 10*3/uL (ref 1.4–7.7)
Neutrophils Relative %: 54.3 % (ref 43.0–77.0)
Platelets: 235 10*3/uL (ref 150.0–400.0)
RBC: 4.11 Mil/uL (ref 3.87–5.11)
RDW: 13.8 % (ref 11.5–15.5)
WBC: 4.3 10*3/uL (ref 4.0–10.5)

## 2021-10-04 LAB — COMPREHENSIVE METABOLIC PANEL
ALT: 19 U/L (ref 0–35)
AST: 24 U/L (ref 0–37)
Albumin: 4.5 g/dL (ref 3.5–5.2)
Alkaline Phosphatase: 159 U/L — ABNORMAL HIGH (ref 39–117)
BUN: 20 mg/dL (ref 6–23)
CO2: 26 mEq/L (ref 19–32)
Calcium: 10.3 mg/dL (ref 8.4–10.5)
Chloride: 105 mEq/L (ref 96–112)
Creatinine, Ser: 0.86 mg/dL (ref 0.40–1.20)
GFR: 68.69 mL/min (ref >60.00)
Glucose, Bld: 101 mg/dL — ABNORMAL HIGH (ref 70–99)
Potassium: 4.5 mEq/L (ref 3.5–5.1)
Sodium: 140 mEq/L (ref 135–145)
Total Bilirubin: 0.7 mg/dL (ref 0.2–1.2)
Total Protein: 7.4 g/dL (ref 6.0–8.3)

## 2021-10-04 LAB — LIPID PANEL
Cholesterol: 183 mg/dL (ref 0–200)
HDL: 83.7 mg/dL (ref >39.00)
LDL Cholesterol: 86 mg/dL (ref 0–99)
NonHDL: 99.08
Total CHOL/HDL Ratio: 2
Triglycerides: 67 mg/dL (ref 0.0–149.0)
VLDL: 13.4 mg/dL (ref 0.0–40.0)

## 2021-10-04 LAB — HEMOGLOBIN A1C: Hgb A1c MFr Bld: 6.1 % (ref 4.6–6.5)

## 2021-10-04 LAB — TSH: TSH: 1.81 u[IU]/mL (ref 0.35–5.50)

## 2021-10-04 MED ORDER — LORATADINE 10 MG PO TABS: ORAL_TABLET | ORAL | 3 refills | Status: DC

## 2021-10-04 MED ORDER — ALBUTEROL SULFATE HFA 108 (90 BASE) MCG/ACT IN AERS: 2.0000 | INHALATION_SPRAY | Freq: Four times a day (QID) | RESPIRATORY_TRACT | 0 refills | Status: DC | PRN

## 2021-10-04 MED ORDER — FLUTICASONE PROPIONATE 50 MCG/ACT NA SUSP: NASAL | 12 refills | Status: DC

## 2021-10-04 MED ORDER — LOSARTAN POTASSIUM 100 MG PO TABS: 100.0000 mg | ORAL_TABLET | Freq: Every day | ORAL | 3 refills | Status: DC

## 2021-10-04 MED ORDER — TRAMADOL HCL 50 MG PO TABS
50.0000 mg | ORAL_TABLET | Freq: Two times a day (BID) | ORAL | 0 refills | Status: AC | PRN
Start: 2021-10-04 — End: 2021-10-09

## 2021-10-04 MED ORDER — NEBIVOLOL HCL 2.5 MG PO TABS: 2.5000 mg | ORAL_TABLET | Freq: Every day | ORAL | 3 refills | Status: DC

## 2021-10-04 MED ORDER — AZELASTINE HCL 0.1 % NA SOLN: 2.0000 | Freq: Two times a day (BID) | NASAL | 12 refills | Status: DC

## 2021-10-04 NOTE — Telephone Encounter (Signed)
Lft pt vm to call ofc to sch CT. thanks 

## 2021-10-04 NOTE — Addendum Note (Signed)
Addended by: Orland Mustard on: 10/04/2021 12:30 PM   Modules accepted: Orders

## 2021-10-04 NOTE — Patient Instructions (Addendum)
Colonoscopy 07/24/2023 due   Call for mammogram [facility] [address], [place], [place] [address] Hours:  Open ? Closes 5?PM Phone: [phone]    High Cholesterol  High cholesterol is a condition in which the blood has high levels of a white, waxy substance similar to fat (cholesterol). The liver makes all the cholesterol that the body needs. The human body needs small amounts of cholesterol to help build cells. A person gets extra or excess cholesterol from the food that he or she eats. The blood carries cholesterol from the liver to the rest of the body. If you have high cholesterol, deposits (plaques) may build up on the walls of your arteries. Arteries are the blood vessels that carry blood away from your heart. These plaques make the arteries narrow and stiff. Cholesterol plaques increase your risk for heart attack and stroke. Work with your health care provider to keep your cholesterol levels in a healthy range. What increases the risk? The following factors may make you more likely to develop this condition: Eating foods that are high in animal fat (saturated fat) or cholesterol. Being overweight. Not getting enough exercise. A family history of high cholesterol (familial hypercholesterolemia). Use of tobacco products. Having diabetes. What are the signs or symptoms? In most cases, high cholesterol does not usually cause any symptoms. In severe cases, very high cholesterol levels can cause: Fatty bumps under the skin (xanthomas). A white or gray ring around the black center (pupil) of the eye. How is this diagnosed? This condition may be diagnosed based on the results of a blood test. If you are older than 70 years of age, your health care provider may check your cholesterol levels every 4-6 years. You may be checked more often if you have high cholesterol or other risk factors for heart disease. The blood test for cholesterol measures: "Bad" cholesterol, or LDL  cholesterol. This is the main type of cholesterol that causes heart disease. The desired level is less than 100 mg/dL (2.59 mmol/L). "Good" cholesterol, or HDL cholesterol. HDL helps protect against heart disease by cleaning the arteries and carrying the LDL to the liver for processing. The desired level for HDL is 60 mg/dL (1.55 mmol/L) or higher. Triglycerides. These are fats that your body can store or burn for energy. The desired level is less than 150 mg/dL (1.69 mmol/L). Total cholesterol. This measures the total amount of cholesterol in your blood and includes LDL, HDL, and triglycerides. The desired level is less than 200 mg/dL (5.17 mmol/L). How is this treated? Treatment for high cholesterol starts with lifestyle changes, such as diet and exercise. Diet changes. You may be asked to eat foods that have more fiber and less saturated fats or added sugar. Lifestyle changes. These may include regular exercise, maintaining a healthy weight, and quitting use of tobacco products. Medicines. These are given when diet and lifestyle changes have not worked. You may be prescribed a statin medicine to help lower your cholesterol levels. Follow these instructions at home: Eating and drinking  Eat a healthy, balanced diet. This diet includes: Daily servings of a variety of fresh, frozen, or canned fruits and vegetables. Daily servings of whole grain foods that are rich in fiber. Foods that are low in saturated fats and trans fats. These include poultry and fish without skin, lean cuts of meat, and low-fat dairy products. A variety of fish, especially oily fish that contain omega-3 fatty acids. Aim to eat fish at least 2 times a week.  Avoid foods and drinks that have added sugar. Use healthy cooking methods, such as roasting, grilling, broiling, baking, poaching, steaming, and stir-frying. Do not fry your food except for stir-frying. If you drink alcohol: Limit how much you have to: 0-1 drink a day  for women who are not pregnant. 0-2 drinks a day for men. Know how much alcohol is in a drink. In the [place] one drink equals one 12 oz bottle of beer (355 mL), one 5 oz glass of wine (148 mL), or one 1 oz glass of hard liquor (44 mL). Lifestyle  Get regular exercise. Aim to exercise for a total of 150 minutes a week. Increase your activity level by doing activities such as gardening, walking, and taking the stairs. Do not use any products that contain nicotine or tobacco. These products include cigarettes, chewing tobacco, and vaping devices, such as e-cigarettes. If you need help quitting, ask your health care provider. General instructions Take over-the-counter and prescription medicines only as told by your health care provider. Keep all follow-up visits. This is important. Where to find more information American Heart Association: www.heart.org National Heart, Lung, and Blood Institute: https://wilson-eaton.com/ Contact a health care provider if: You have trouble achieving or maintaining a healthy diet or weight. You are starting an exercise program. You are unable to stop smoking. Get help right away if: You have chest pain. You have trouble breathing. You have discomfort or pain in your jaw, neck, back, shoulder, or arm. You have any symptoms of a stroke. "BE FAST" is an easy way to remember the main warning signs of a stroke: B - Balance. Signs are dizziness, sudden trouble walking, or loss of balance. E - Eyes. Signs are trouble seeing or a sudden change in vision. F - Face. Signs are sudden weakness or numbness of the face, or the face or eyelid drooping on one side. A - Arms. Signs are weakness or numbness in an arm. This happens suddenly and usually on one side of the body. S - Speech. Signs are sudden trouble speaking, slurred speech, or trouble understanding what people say. T - Time. Time to call emergency services. Write down what time symptoms started. You have other signs of a  stroke, such as: A sudden, severe headache with no known cause. Nausea or vomiting. Seizure. These symptoms may represent a serious problem that is an emergency. Do not wait to see if the symptoms will go away. Get medical help right away. Call your local emergency services (911 in the [place] Do not drive yourself to the hospital. Summary Cholesterol plaques increase your risk for heart attack and stroke. Work with your health care provider to keep your cholesterol levels in a healthy range. Eat a healthy, balanced diet, get regular exercise, and maintain a healthy weight. Do not use any products that contain nicotine or tobacco. These products include cigarettes, chewing tobacco, and vaping devices, such as e-cigarettes. Get help right away if you have any symptoms of a stroke. This information is not intended to replace advice given to you by your health care provider. Make sure you discuss any questions you have with your health care provider. Document Revised: 06/22/2020 Document Reviewed: 06/12/2020 Elsevier Patient Education  [place].  Cholesterol Content in Foods Cholesterol is a waxy, fat-like substance that helps to carry fat in the blood. The body needs cholesterol in small amounts, but too much cholesterol can cause damage to the arteries and heart. What foods have cholesterol?  Cholesterol is found  in animal-based foods, such as meat, seafood, and dairy. Generally, low-fat dairy and lean meats have less cholesterol than full-fat dairy and fatty meats. The milligrams of cholesterol per serving (mg per serving) of common cholesterol-containing foods are listed below. Meats and other proteins Egg -- one large whole egg has 186 mg. Veal shank -- 4 oz (113 g) has 141 mg. Lean ground [place] (93% lean) -- 4 oz (113 g) has 118 mg. Fat-trimmed lamb loin -- 4 oz (113 g) has 106 mg. Lean ground beef (90% lean) -- 4 oz (113 g) has 100 mg. Lobster -- 3.5 oz (99 g) has 90 mg. Pork  loin chops -- 4 oz (113 g) has 86 mg. Canned salmon -- 3.5 oz (99 g) has 83 mg. Fat-trimmed beef top loin -- 4 oz (113 g) has 78 mg. Frankfurter -- 1 frank (3.5 oz or 99 g) has 77 mg. Crab -- 3.5 oz (99 g) has 71 mg. Roasted chicken without skin, white meat -- 4 oz (113 g) has 66 mg. Light bologna -- 2 oz (57 g) has 45 mg. Deli-cut [place] -- 2 oz (57 g) has 31 mg. Canned tuna -- 3.5 oz (99 g) has 31 mg. [name] -- 1 oz (28 g) has 29 mg. Oysters and mussels (raw) -- 3.5 oz (99 g) has 25 mg. Mackerel -- 1 oz (28 g) has 22 mg. Trout -- 1 oz (28 g) has 20 mg. Pork sausage -- 1 link (1 oz or 28 g) has 17 mg. Salmon -- 1 oz (28 g) has 16 mg. Tilapia -- 1 oz (28 g) has 14 mg. Dairy Soft-serve ice cream --  cup (4 oz or 86 g) has 103 mg. Whole-milk yogurt -- 1 cup (8 oz or 245 g) has 29 mg. Cheddar cheese -- 1 oz (28 g) has 28 mg. American cheese -- 1 oz (28 g) has 28 mg. Whole milk -- 1 cup (8 oz or 250 mL) has 23 mg. 2% milk -- 1 cup (8 oz or 250 mL) has 18 mg. Cream cheese -- 1 tablespoon (Tbsp) (14.5 g) has 15 mg. Cottage cheese --  cup (4 oz or 113 g) has 14 mg. Low-fat (1%) milk -- 1 cup (8 oz or 250 mL) has 10 mg. Sour cream -- 1 Tbsp (12 g) has 8.5 mg. Low-fat yogurt -- 1 cup (8 oz or 245 g) has 8 mg. Nonfat Greek yogurt -- 1 cup (8 oz or 228 g) has 7 mg. Half-and-half cream -- 1 Tbsp (15 mL) has 5 mg. Fats and oils Cod liver oil -- 1 tablespoon (Tbsp) (13.6 g) has 82 mg. Butter -- 1 Tbsp (14 g) has 15 mg. Lard -- 1 Tbsp (12.8 g) has 14 mg. Bacon grease -- 1 Tbsp (12.9 g) has 14 mg. Mayonnaise -- 1 Tbsp (13.8 g) has 5-10 mg. Margarine -- 1 Tbsp (14 g) has 3-10 mg. The items listed above may not be a complete list of foods with cholesterol. Exact amounts of cholesterol in these foods may vary depending on specific ingredients and brands. Contact a dietitian for more information. What foods do not have cholesterol? Most plant-based foods do not have cholesterol unless you combine  them with a food that has cholesterol. Foods without cholesterol include: Grains and cereals. Vegetables. Fruits. Vegetable oils, such as olive, canola, and sunflower oil. Legumes, such as peas, beans, and lentils. Nuts and seeds. Egg whites. The items listed above may not be a complete list of foods that  do not have cholesterol. Contact a dietitian for more information. Summary The body needs cholesterol in small amounts, but too much cholesterol can cause damage to the arteries and heart. Cholesterol is found in animal-based foods, such as meat, seafood, and dairy. Generally, low-fat dairy and lean meats have less cholesterol than full-fat dairy and fatty meats. This information is not intended to replace advice given to you by your health care provider. Make sure you discuss any questions you have with your health care provider. Document Revised: 08/18/2020 Document Reviewed: 08/18/2020 Elsevier Patient Education  [place].

## 2021-10-04 NOTE — Addendum Note (Signed)
Addended by: Orland Mustard on: 10/04/2021 03:41 PM   Modules accepted: Orders

## 2021-10-04 NOTE — Addendum Note (Signed)
Addended by: Orland Mustard on: 10/04/2021 12:34 PM   Modules accepted: Orders
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
## 2021-10-05 ENCOUNTER — Other Ambulatory Visit (INDEPENDENT_AMBULATORY_CARE_PROVIDER_SITE_OTHER): Payer: PPO

## 2021-10-05 DIAGNOSIS — M5416 Radiculopathy, lumbar region: Secondary | ICD-10-CM | POA: Diagnosis not present

## 2021-10-05 DIAGNOSIS — R748 Abnormal levels of other serum enzymes: Secondary | ICD-10-CM | POA: Diagnosis not present

## 2021-10-05 LAB — URINALYSIS, ROUTINE W REFLEX MICROSCOPIC
Bilirubin Urine: NEGATIVE
Glucose, UA: NEGATIVE
Hgb urine dipstick: NEGATIVE
Ketones, ur: NEGATIVE
Leukocytes,Ua: NEGATIVE
Nitrite: NEGATIVE
Protein, ur: NEGATIVE
Specific Gravity, Urine: 1.005 (ref 1.001–1.035)
pH: 7.5 (ref 5.0–8.0)

## 2021-10-10 ENCOUNTER — Ambulatory Visit
Admission: RE | Admit: 2021-10-10 | Discharge: 2021-10-10 | Disposition: A | Discharge status: Home / Self Care | Payer: PPO | Source: Ambulatory Visit | Attending: Internal Medicine | Admitting: Internal Medicine

## 2021-10-10 DIAGNOSIS — R911 Solitary pulmonary nodule: Secondary | ICD-10-CM | POA: Diagnosis not present

## 2021-10-10 DIAGNOSIS — R918 Other nonspecific abnormal finding of lung field: Secondary | ICD-10-CM | POA: Diagnosis not present

## 2021-10-11 ENCOUNTER — Other Ambulatory Visit: Payer: Self-pay | Admitting: Family

## 2021-10-11 ENCOUNTER — Telehealth: Payer: Self-pay

## 2021-10-11 DIAGNOSIS — R911 Solitary pulmonary nodule: Secondary | ICD-10-CM

## 2021-10-11 LAB — ALKALINE PHOSPHATASE, ISOENZYMES
Alkaline Phosphatase: 176 IU/L — ABNORMAL HIGH (ref 44–121)
BONE FRACTION: 48 % (ref 14–68)
INTESTINAL FRAC.: 2 % (ref 0–18)
LIVER FRACTION: 50 % (ref 18–85)

## 2021-10-11 LAB — GAMMA GT: GGT: 23 IU/L (ref 0–60)

## 2021-10-11 NOTE — Telephone Encounter (Signed)
Malachy Mood called from [facility] to state the CT chest results for patient were faxed to [place] around midnight last night.  Malachy Mood states there is a critical finding on it.  Malachy Mood states there is a left upper pulmonary nodule, highly suspicious for metastatic disease.

## 2021-10-12 ENCOUNTER — Other Ambulatory Visit: Payer: Self-pay | Admitting: Family

## 2021-10-12 DIAGNOSIS — R911 Solitary pulmonary nodule: Secondary | ICD-10-CM

## 2021-10-12 DIAGNOSIS — R748 Abnormal levels of other serum enzymes: Secondary | ICD-10-CM

## 2021-10-17 ENCOUNTER — Ambulatory Visit: Payer: PPO | Admitting: Pulmonary Disease

## 2021-10-17 ENCOUNTER — Telehealth: Payer: Self-pay

## 2021-10-17 ENCOUNTER — Encounter: Payer: Self-pay | Admitting: Pulmonary Disease

## 2021-10-17 VITALS — BP 136/80 | HR 99 | Temp 97.4°F | Ht 59.0 in | Wt 148.0 lb

## 2021-10-17 DIAGNOSIS — R911 Solitary pulmonary nodule: Secondary | ICD-10-CM

## 2021-10-17 DIAGNOSIS — R918 Other nonspecific abnormal finding of lung field: Secondary | ICD-10-CM | POA: Diagnosis not present

## 2021-10-17 NOTE — Patient Instructions (Signed)
We are getting 2 scans 1 is a PET/CT and 1 is a mapping scan that will help me guide for the procedure.  Procedure will be scheduled for 31 July at 12:30 PM  We will see you in follow-up in 4 to 6 weeks time call sooner should any new problems arise.  We will stay in contact with you after the procedure.

## 2021-10-17 NOTE — Telephone Encounter (Signed)
Robotic bronchoscopy with navigation and cellvizio scheduled 11/19/2021. RFX:[id number] ME:BRAX nodule  Rodena Piety, please advise. Thanks
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
## 2021-10-18 NOTE — Telephone Encounter (Signed)
Phone pre admit visit 11/09/2021 between 1-5p and covid test 11/16/2021 between 8-12.  Patient is aware of above dates and voiced her understanding.  Nothing further needed.

## 2021-10-18 NOTE — Telephone Encounter (Signed)
For the codes 31627, [phone] Prior [place] Not Required Refer # Harrell Gave T. 10/18/2021

## 2021-10-19 DIAGNOSIS — R2689 Other abnormalities of gait and mobility: Secondary | ICD-10-CM | POA: Diagnosis not present

## 2021-10-19 DIAGNOSIS — M25551 Pain in right hip: Secondary | ICD-10-CM | POA: Diagnosis not present

## 2021-10-21 ENCOUNTER — Encounter: Payer: Self-pay | Admitting: Internal Medicine

## 2021-10-25 DIAGNOSIS — M25551 Pain in right hip: Secondary | ICD-10-CM | POA: Diagnosis not present

## 2021-10-25 DIAGNOSIS — R2689 Other abnormalities of gait and mobility: Secondary | ICD-10-CM | POA: Diagnosis not present

## 2021-10-29 ENCOUNTER — Encounter: Payer: Self-pay | Admitting: Pulmonary Disease

## 2021-10-29 ENCOUNTER — Encounter: Payer: Self-pay | Admitting: Internal Medicine

## 2021-11-05 DIAGNOSIS — R2689 Other abnormalities of gait and mobility: Secondary | ICD-10-CM | POA: Diagnosis not present

## 2021-11-05 DIAGNOSIS — M25551 Pain in right hip: Secondary | ICD-10-CM | POA: Diagnosis not present

## 2021-11-08 DIAGNOSIS — M25551 Pain in right hip: Secondary | ICD-10-CM | POA: Diagnosis not present

## 2021-11-08 DIAGNOSIS — R2689 Other abnormalities of gait and mobility: Secondary | ICD-10-CM | POA: Diagnosis not present

## 2021-11-09 ENCOUNTER — Encounter
Admission: RE | Admit: 2021-11-09 | Discharge: 2021-11-09 | Disposition: A | Discharge status: Home / Self Care | Payer: PPO | Source: Ambulatory Visit | Attending: Pulmonary Disease | Admitting: Pulmonary Disease

## 2021-11-09 ENCOUNTER — Other Ambulatory Visit: Payer: Self-pay

## 2021-11-09 DIAGNOSIS — I10 Essential (primary) hypertension: Secondary | ICD-10-CM

## 2021-11-09 DIAGNOSIS — Z01812 Encounter for preprocedural laboratory examination: Secondary | ICD-10-CM

## 2021-11-09 HISTORY — DX: Anxiety disorder, unspecified: F41.9

## 2021-11-09 HISTORY — DX: Pneumonia, unspecified organism: J18.9

## 2021-11-09 HISTORY — DX: Prediabetes: R73.03

## 2021-11-09 HISTORY — DX: Anemia, unspecified: D64.9

## 2021-11-09 HISTORY — DX: Unspecified osteoarthritis, unspecified site: M19.90

## 2021-11-09 NOTE — Patient Instructions (Addendum)
Your procedure is scheduled on: 11/19/21 - Monday Report to the Registration Desk on the 1st floor of the [facility]. To find out your arrival time, please call [phone] between 1PM - 3PM on: 11/16/21 - Friday If your arrival time is 6:00 am, do not arrive prior to that time as the [facility] entrance doors do not open until 6:00 am.  REMEMBER: Instructions that are not followed completely may result in serious medical risk, up to and including death; or upon the discretion of your surgeon and anesthesiologist your surgery may need to be rescheduled.  Do not eat food after midnight the night before surgery.  No gum chewing, lozengers or hard candies.  TAKE THESE MEDICATIONS THE MORNING OF SURGERY WITH A SIP OF WATER:  - nebivolol (BYSTOLIC)  Use albuterol (VENTOLIN HFA)  on the day of surgery and bring to the hospital.  One week prior to surgery: Stop Anti-inflammatories (NSAIDS) such as Advil, Aleve, Ibuprofen, Motrin, Naproxen, Naprosyn and Aspirin based products such as Excedrin, Goodys Powder, BC Powder.  Stop ANY OVER THE COUNTER supplements until after surgery.  You may take Tylenol if needed for pain up until the day of surgery.  No Alcohol for 24 hours before or after surgery.  No Smoking including e-cigarettes for 24 hours prior to surgery.  No chewable tobacco products for at least 6 hours prior to surgery.  No nicotine patches on the day of surgery.  Do not use any "recreational" drugs for at least a week prior to your surgery.  Please be advised that the combination of cocaine and anesthesia may have negative outcomes, up to and including death. If you test positive for cocaine, your surgery will be cancelled.  On the morning of surgery brush your teeth with toothpaste and water, you may rinse your mouth with mouthwash if you wish. Do not swallow any toothpaste or mouthwash.  Do not wear jewelry, make-up, hairpins, clips or nail polish.  Do not wear  lotions, powders, or perfumes.   Do not shave body from the neck down 48 hours prior to surgery just in case you cut yourself which could leave a site for infection.  Also, freshly shaved skin may become irritated if using the CHG soap.  Contact lenses, hearing aids and dentures may not be worn into surgery.  Do not bring valuables to the hospital. [facility] is not responsible for any missing/lost belongings or valuables.   Notify your doctor if there is any change in your medical condition (cold, fever, infection).  Wear comfortable clothing (specific to your surgery type) to the hospital.  After surgery, you can help prevent lung complications by doing breathing exercises.  Take deep breaths and cough every 1-2 hours. Your doctor may order a device called an Incentive Spirometer to help you take deep breaths. When coughing or sneezing, hold a pillow firmly against your incision with both hands. This is called "splinting." Doing this helps protect your incision. It also decreases belly discomfort.  If you are being admitted to the hospital overnight, leave your suitcase in the car. After surgery it may be brought to your room.  If you are being discharged the day of surgery, you will not be allowed to drive home. You will need a responsible adult (18 years or older) to drive you home and stay with you that night.   If you are taking public transportation, you will need to have a responsible adult (18 years or older) with you. Please  confirm with your physician that it is acceptable to use public transportation.   Please call the [facility] Dept. at [phone] if you have any questions about these instructions.  Surgery Visitation Policy:  Patients undergoing a surgery or procedure may have two family members or support persons with them as long as the person is not COVID-19 positive or experiencing its symptoms.   Inpatient Visitation:    Visiting hours are 7 a.m.  to 8 p.m. Up to four visitors are allowed at one time in a patient room, including children. The visitors may rotate out with other people during the day. One designated support person (adult) may remain overnight.

## 2021-11-12 DIAGNOSIS — N281 Cyst of kidney, acquired: Secondary | ICD-10-CM | POA: Insufficient documentation

## 2021-11-12 DIAGNOSIS — R2689 Other abnormalities of gait and mobility: Secondary | ICD-10-CM | POA: Diagnosis not present

## 2021-11-12 DIAGNOSIS — M25551 Pain in right hip: Secondary | ICD-10-CM | POA: Diagnosis not present

## 2021-11-12 NOTE — Addendum Note (Signed)
Addended by: Orland Mustard on: 11/12/2021 09:37 PM   Modules accepted: Orders

## 2021-11-14 ENCOUNTER — Ambulatory Visit
Admission: RE | Admit: 2021-11-14 | Discharge: 2021-11-14 | Disposition: A | Discharge status: Home / Self Care | Payer: PPO | Source: Ambulatory Visit | Attending: Pulmonary Disease | Admitting: Pulmonary Disease

## 2021-11-14 DIAGNOSIS — K8020 Calculus of gallbladder without cholecystitis without obstruction: Secondary | ICD-10-CM | POA: Insufficient documentation

## 2021-11-14 DIAGNOSIS — M4316 Spondylolisthesis, lumbar region: Secondary | ICD-10-CM | POA: Insufficient documentation

## 2021-11-14 DIAGNOSIS — R918 Other nonspecific abnormal finding of lung field: Secondary | ICD-10-CM | POA: Insufficient documentation

## 2021-11-14 DIAGNOSIS — R911 Solitary pulmonary nodule: Secondary | ICD-10-CM | POA: Diagnosis present

## 2021-11-14 LAB — GLUCOSE, CAPILLARY: Glucose-Capillary: 104 mg/dL — ABNORMAL HIGH (ref 70–99)

## 2021-11-14 MED ORDER — FLUDEOXYGLUCOSE F - 18 (FDG) INJECTION
7.7000 | Freq: Once | INTRAVENOUS | Status: AC | PRN
Start: 2021-11-14 — End: 2021-11-14
  Administered 2021-11-14: 8.09 via INTRAVENOUS

## 2021-11-15 DIAGNOSIS — R2689 Other abnormalities of gait and mobility: Secondary | ICD-10-CM | POA: Diagnosis not present

## 2021-11-15 DIAGNOSIS — M25551 Pain in right hip: Secondary | ICD-10-CM | POA: Diagnosis not present

## 2021-11-16 ENCOUNTER — Encounter
Admission: RE | Admit: 2021-11-16 | Discharge: 2021-11-16 | Disposition: A | Discharge status: Home / Self Care | Payer: PPO | Source: Ambulatory Visit | Attending: Pulmonary Disease | Admitting: Pulmonary Disease

## 2021-11-16 DIAGNOSIS — Z01812 Encounter for preprocedural laboratory examination: Secondary | ICD-10-CM | POA: Insufficient documentation

## 2021-11-16 DIAGNOSIS — Z01818 Encounter for other preprocedural examination: Secondary | ICD-10-CM

## 2021-11-16 DIAGNOSIS — Z20822 Contact with and (suspected) exposure to covid-19: Secondary | ICD-10-CM | POA: Diagnosis not present

## 2021-11-16 DIAGNOSIS — I10 Essential (primary) hypertension: Secondary | ICD-10-CM | POA: Diagnosis not present

## 2021-11-16 LAB — SARS CORONAVIRUS 2 (TAT 6-24 HRS): SARS Coronavirus 2: NEGATIVE

## 2021-11-19 ENCOUNTER — Ambulatory Visit: Payer: PPO | Admitting: Anesthesiology

## 2021-11-19 ENCOUNTER — Encounter: Payer: Self-pay | Admitting: Pulmonary Disease

## 2021-11-19 ENCOUNTER — Ambulatory Visit: Payer: PPO

## 2021-11-19 ENCOUNTER — Encounter
Admission: RE | Disposition: A | Discharge status: Home / Self Care | Payer: Self-pay | Source: Home / Self Care | Attending: Pulmonary Disease

## 2021-11-19 ENCOUNTER — Other Ambulatory Visit: Payer: Self-pay

## 2021-11-19 ENCOUNTER — Ambulatory Visit
Admission: RE | Admit: 2021-11-19 | Discharge: 2021-11-19 | Disposition: A | Discharge status: Home / Self Care | Payer: PPO | Attending: Pulmonary Disease | Admitting: Pulmonary Disease

## 2021-11-19 ENCOUNTER — Telehealth: Payer: Self-pay | Admitting: Pulmonary Disease

## 2021-11-19 DIAGNOSIS — R918 Other nonspecific abnormal finding of lung field: Secondary | ICD-10-CM

## 2021-11-19 DIAGNOSIS — R911 Solitary pulmonary nodule: Secondary | ICD-10-CM | POA: Insufficient documentation

## 2021-11-19 DIAGNOSIS — R590 Localized enlarged lymph nodes: Secondary | ICD-10-CM

## 2021-11-19 DIAGNOSIS — M898X8 Other specified disorders of bone, other site: Secondary | ICD-10-CM

## 2021-11-19 DIAGNOSIS — R079 Chest pain, unspecified: Secondary | ICD-10-CM | POA: Diagnosis not present

## 2021-11-19 DIAGNOSIS — C3412 Malignant neoplasm of upper lobe, left bronchus or lung: Secondary | ICD-10-CM | POA: Insufficient documentation

## 2021-11-19 DIAGNOSIS — F419 Anxiety disorder, unspecified: Secondary | ICD-10-CM | POA: Diagnosis not present

## 2021-11-19 DIAGNOSIS — C3410 Malignant neoplasm of upper lobe, unspecified bronchus or lung: Secondary | ICD-10-CM | POA: Diagnosis not present

## 2021-11-19 DIAGNOSIS — E119 Type 2 diabetes mellitus without complications: Secondary | ICD-10-CM | POA: Diagnosis not present

## 2021-11-19 DIAGNOSIS — C3490 Malignant neoplasm of unspecified part of unspecified bronchus or lung: Secondary | ICD-10-CM | POA: Diagnosis not present

## 2021-11-19 DIAGNOSIS — Z8616 Personal history of COVID-19: Secondary | ICD-10-CM | POA: Diagnosis not present

## 2021-11-19 DIAGNOSIS — R000 Tachycardia, unspecified: Secondary | ICD-10-CM | POA: Diagnosis not present

## 2021-11-19 DIAGNOSIS — C3492 Malignant neoplasm of unspecified part of left bronchus or lung: Secondary | ICD-10-CM | POA: Diagnosis not present

## 2021-11-19 SURGERY — BRONCHOSCOPY, WITH BIOPSY USING ELECTROMAGNETIC NAVIGATION
Anesthesia: General | Laterality: Left

## 2021-11-19 MED ORDER — DEXAMETHASONE SODIUM PHOSPHATE 10 MG/ML IJ SOLN
INTRAMUSCULAR | Status: DC | PRN
  Administered 2021-11-19: 10 mg via INTRAVENOUS

## 2021-11-19 MED ORDER — CHLORHEXIDINE GLUCONATE 0.12 % MT SOLN: 15.0000 mL | Freq: Once | OROMUCOSAL | Status: AC

## 2021-11-19 MED ORDER — FAMOTIDINE 20 MG PO TABS: 20.0000 mg | ORAL_TABLET | Freq: Once | ORAL | Status: AC

## 2021-11-19 MED ORDER — FENTANYL CITRATE (PF) 100 MCG/2ML IJ SOLN
INTRAMUSCULAR | Status: AC
  Filled 2021-11-19: qty 2

## 2021-11-19 MED ORDER — LIDOCAINE HCL (CARDIAC) PF 100 MG/5ML IV SOSY
PREFILLED_SYRINGE | INTRAVENOUS | Status: DC | PRN
  Administered 2021-11-19: 80 mg via INTRAVENOUS

## 2021-11-19 MED ORDER — LACTATED RINGERS IV SOLN: INTRAVENOUS | Status: DC

## 2021-11-19 MED ORDER — SODIUM CHLORIDE 0.9 % IV SOLN: Freq: Once | INTRAVENOUS | Status: DC

## 2021-11-19 MED ORDER — IPRATROPIUM-ALBUTEROL 0.5-2.5 (3) MG/3ML IN SOLN
RESPIRATORY_TRACT | Status: AC
  Administered 2021-11-19: 3 mL via RESPIRATORY_TRACT
  Filled 2021-11-19: qty 3

## 2021-11-19 MED ORDER — CHLORHEXIDINE GLUCONATE 0.12 % MT SOLN
OROMUCOSAL | Status: AC
  Administered 2021-11-19: 15 mL via OROMUCOSAL
  Filled 2021-11-19: qty 15

## 2021-11-19 MED ORDER — FAMOTIDINE 20 MG PO TABS
ORAL_TABLET | ORAL | Status: AC
  Administered 2021-11-19: 20 mg via ORAL
  Filled 2021-11-19: qty 1

## 2021-11-19 MED ORDER — IPRATROPIUM-ALBUTEROL 0.5-2.5 (3) MG/3ML IN SOLN: 3.0000 mL | Freq: Once | RESPIRATORY_TRACT | Status: AC

## 2021-11-19 MED ORDER — MIDAZOLAM HCL 2 MG/2ML IJ SOLN
INTRAMUSCULAR | Status: AC
  Filled 2021-11-19: qty 2

## 2021-11-19 MED ORDER — FENTANYL CITRATE (PF) 100 MCG/2ML IJ SOLN
INTRAMUSCULAR | Status: DC | PRN
  Administered 2021-11-19: 50 ug via INTRAVENOUS

## 2021-11-19 MED ORDER — ROCURONIUM BROMIDE 100 MG/10ML IV SOLN
INTRAVENOUS | Status: DC | PRN
  Administered 2021-11-19: 20 mg via INTRAVENOUS
  Administered 2021-11-19: 50 mg via INTRAVENOUS

## 2021-11-19 MED ORDER — ORAL CARE MOUTH RINSE: 15.0000 mL | Freq: Once | OROMUCOSAL | Status: AC

## 2021-11-19 MED ORDER — GLYCOPYRROLATE 0.2 MG/ML IJ SOLN
INTRAMUSCULAR | Status: DC | PRN
  Administered 2021-11-19: .2 mg via INTRAVENOUS

## 2021-11-19 MED ORDER — ONDANSETRON HCL 4 MG/2ML IJ SOLN
INTRAMUSCULAR | Status: DC | PRN
  Administered 2021-11-19 (×2): 4 mg via INTRAVENOUS

## 2021-11-19 MED ORDER — SUGAMMADEX SODIUM 200 MG/2ML IV SOLN
INTRAVENOUS | Status: DC | PRN
  Administered 2021-11-19: 300 mg via INTRAVENOUS

## 2021-11-19 MED ORDER — MIDAZOLAM HCL 2 MG/2ML IJ SOLN
INTRAMUSCULAR | Status: DC | PRN
  Administered 2021-11-19: 2 mg via INTRAVENOUS

## 2021-11-19 MED ORDER — PROPOFOL 10 MG/ML IV BOLUS
INTRAVENOUS | Status: DC | PRN
  Administered 2021-11-19 (×2): 100 mg via INTRAVENOUS

## 2021-11-19 NOTE — Anesthesia Preprocedure Evaluation (Signed)
Anesthesia Evaluation  Patient identified by MRN, date of birth, ID band Patient awake    Reviewed: Allergy & Precautions, NPO status , Patient's Chart, lab work & pertinent test results  Airway Mallampati: III  TM Distance: >3 FB Neck ROM: full    Dental  (+) Chipped   Pulmonary pneumonia,    Pulmonary exam normal        Cardiovascular Exercise Tolerance: Good hypertension, (-) angina(-) Past MI Normal cardiovascular exam     Neuro/Psych  Neuromuscular disease negative psych ROS   GI/Hepatic negative GI ROS, Neg liver ROS,   Endo/Other  diabetes  Renal/GU Renal disease     Musculoskeletal   Abdominal   Peds  Hematology negative hematology ROS (+)   Anesthesia Other Findings Past Medical History: No date: Anemia No date: Anxiety No date: Arthritis No date: COVID-19     Comment:  03/2021 No date: Hypertension No date: Pneumonia No date: Pre-diabetes No date: Prediabetes No date: Sciatica  Past Surgical History: No date: CESAREAN SECTION No date: COLONOSCOPY W/ POLYPECTOMY No date: ECTOPIC PREGNANCY SURGERY No date: FEMUR SURGERY     Comment:  left, s/p rod for fracture No date: TUBAL LIGATION  BMI    Body Mass Index: 29.88 kg/m      Reproductive/Obstetrics negative OB ROS                             Anesthesia Physical Anesthesia Plan  ASA: 3  Anesthesia Plan: General ETT   Post-op Pain Management:    Induction: Intravenous  PONV Risk Score and Plan: Ondansetron, Dexamethasone, Midazolam and Treatment may vary due to age or medical condition  Airway Management Planned: Oral ETT  Additional Equipment:   Intra-op Plan:   Post-operative Plan: Extubation in OR  Informed Consent: I have reviewed the patients History and Physical, chart, labs and discussed the procedure including the risks, benefits and alternatives for the proposed anesthesia with the patient or  authorized representative who has indicated his/her understanding and acceptance.     Dental Advisory Given  Plan Discussed with: Anesthesiologist, CRNA and Surgeon  Anesthesia Plan Comments: (Patient consented for risks of anesthesia including but not limited to:  - adverse reactions to medications - damage to eyes, teeth, lips or other oral mucosa - nerve damage due to positioning  - sore throat or hoarseness - Damage to heart, brain, nerves, lungs, other parts of body or loss of life  Patient voiced understanding.)        Anesthesia Quick Evaluation

## 2021-11-19 NOTE — Anesthesia Procedure Notes (Signed)
Procedure Name: Intubation Date/Time: 11/19/2021 1:20 PM  Performed by: Lily Peer, Hava Massingale, CRNAPre-anesthesia Checklist: Patient identified, Emergency Drugs available, Suction available and Patient being monitored Patient Re-evaluated:Patient Re-evaluated prior to induction Oxygen Delivery Method: Circle system utilized Preoxygenation: Pre-oxygenation with 100% oxygen Induction Type: IV induction Ventilation: Mask ventilation without difficulty Laryngoscope Size: McGraph and 3 Grade View: Grade I Tube type: Oral Tube size: 8.0 mm Number of attempts: 1 Airway Equipment and Method: Stylet Placement Confirmation: ETT inserted through vocal cords under direct vision, positive ETCO2 and breath sounds checked- equal and bilateral Secured at: 19 cm Tube secured with: Tape Dental Injury: Teeth and Oropharynx as per pre-operative assessment

## 2021-11-19 NOTE — Telephone Encounter (Signed)
Patient is aware of below message and voiced her understanding.  Nothing further needed.   

## 2021-11-19 NOTE — Op Note (Signed)
PROCEDURES:  Robotic assisted bronchoscopy Cellvizio probe based confocal laser endomicroscopy (pCLE) Augmented fluoroscopy Endobronchial ultrasound   Indication: Left upper lobe nodule/mass 2.4 x 2.2 cm, hilar mass/adenopathy 2.1 cm,  innumerable small nodules in the lung all subcentimeter, query carcinoma with metastasis.  Preoperative Diagnosis: Left upper lobe nodule/mass and hilar mass/adenopathy plus innumerable small nodules in the lung, rule out carcinoma Post Procedure Diagnosis: Same as above. Consent: Verbal/Written: obtained  Benefits, limitations and potential complications of the procedure were discussed with the patient/family.  Complications from bronchoscopy are rare and most often minor, but if they occur they may include breathing difficulty, vocal cord spasm, hoarseness, slight fever, vomiting, dizziness, bronchospasm, infection, low blood oxygen, bleeding from biopsy site, or an allergic reaction to medications.  It is uncommon for patients to experience other more serious complications for example: Collapsed lung requiring chest tube placement, respiratory failure, heart attack and/or cardiac arrhythmia.  Patient understood the potential complications and agreed to proceed.  Surgeon: Renold Don, MD Assistant/Scrub: Sullivan Lone, RRT Circulator: Annia Belt, RRT Anesthesiologist/CRNA: Norm Salt, CRNA Cytotechnology: [facility] Fluoroscopy technician: Denver Faster, RT Representatives: Shon Millet, Auris Warwick ([employer])  Type of Anesthesia: General endotracheal  Procedures Performed:   Robotic bronchoscopy: Procedure consists of robotic navigation comprised of electromagnetics, optical pattern recognition and robotic kinematic data - to triangulate bronchoscope location during the procedure and provide accurate positional data to biopsy a lesion. Cellvizio probe based confocal laser endomicroscopy (pCLE) utilizing blue laser endomicroscopy. Augmented  fluoroscopy with Body Vision. Endobronchial ultrasound for examination of the mediastinum.  Description of Procedure:  Robotic bronchoscopy: The patient was brought to Procedure Room 2 (Bronchoscopy Suite) in the OR area where appropriate timeout was taken with the staff after the patient was inducted under general anesthesia.  The patient was inducted under general anesthesia and intubated by the anesthesia team.  Patient was intubated with a #8 ET tube without difficulty.  Tube was secured at 4 cm above the carina.  A Portex adapter was placed on the ET tube flange.  Once the patient was under adequate general anesthesia the Olympus therapeutic video bronchoscope was advanced and an anatomic airway tour and surveillance bronchoscopy was performed.The distal trachea appeared unremarkable. The main carina was sharp.  No secretions were seen in either right or left mainstem bronchi. The RUL, RLL, RML appeared to be free of endobronchial masses, lesions, or purulent secretions. Likewise, the LLL/LUL appeared to be free of endobronchial masses, lesions, or purulent secretions.  There was some mild erythema of the most apical subsegment of the left upper lobe however there was no distinct mass. Once the survey bronchoscopy was completed, registration for the augmented fluoroscopy (Body Vision) was then performed with the fluoroscopic C arm.  Once this was completed, the robotic bronchoscope ET tube adapter was placed and ETT was cut to proper length and secured on the mid plane.  The [facility] robotic scope was then advanced through the ETT and registration was performed successfully.  There was good correlation between the robotic mapping and bronchoscopic mapping. With the assistance of fused navigation, the bronchoscope was advanced to the left upper lobe nodule/mass. The tip of the working channel sat within 12 mm of the nodule  Positioning was confirmed with augmented fluoroscopy.  Airway narrowing could be seen  distally in this area.  At this point Cellvizio probe based confocal laser endomicroscopy (pCLE) was utilized to confirm target acquisition.  The images through [facility] were consistent with targeted lesion changes.   Augmented  fluoroscopy via Body Vision was utilized to optimize the position most favorable for biopsies, then the robotic bronchoscope was anchored to maintain position.  At this point a cytology brush was used to sample the area, ROSE was positive for lesional cells on first pass.  A second brushing was then performed and the brush cut into CytoLyt preservative for cytology analysis.  After confirmation of excellent hemostasis, 6 transbronchial biopsies were performed of the LUL nodule/mass.  Once this was completed the robotic bronchoscope was then retracted and navigation to the second target previously selected in the lingula was performed.  Once the target was acquired, imaging was Cellvizio endomicroscopy was performed.  The positioning was also confirmed with augmented fluoroscopy.  Cellvizio images where consistent with targeted lesion.  At this point a total of 10 biopsies were obtained utilizing Cellvizio to "sweep" the area.  A BAL was also obtained at this segment, which sent for cytology analysis.  For BAL sample acquisition purposes, 20 ml of normal saline were instilled, and approximately 4 ml were recovered/trapped and sent for analysis.  This concluded the robotic portion of the case and the robotic bronchoscope was then retracted all the way out after confirmation of excellent hemostasis.   Once this was completed and the robotic bronchoscope was retrieved the Olympus endobronchial ultrasound (EBUS) scope was introduced into the airway via Portex adapter.  At this point the mediastinum was examined there was a cluster of lymph nodes noted in the subcarinal space, that were too small to sample.  The precarinal area did not show any significant adenopathy.  There was small shotty  adenopathy on the right hilar area that did not meet criteria for malignant node.  Reexamination of the left hilar area showed the suprahilar mass but no distinct lymphadenopathy, this was surrounded by numerous blood vessels.  There was not a clear window for sampling of this area.  After the endobronchial ultrasound examination was completed, the airway was inspected for adequate hemostasis.  Once this was confirmed, the patient received bronchial lavage with 10 mL of 1% lidocaine via the ET tube. The patient tolerated the procedure well. No significant bleeding was observed at the conclusion of the procedure.  At this point, the patient was allowed to emerge from general anesthesia, and was extubated in the procedure room without incident.  The patient  was taken to the PACU in satisfactory condition.  Auscultation of the lungs showed no change from pre bronchoscopy examination.  Patient tolerated the procedure very well with no untoward effects of anesthesia noted.   Specimens Obtained:  Transbronchial Forceps Biopsy: X 6 of nodule/mass, left upper lobe, X 10 of lingula nodule  Transbronchial Brush: X 2, left upper lobe  Targeted BAL: 4 mL, lingula  Fluoroscopy: Augmented fluoroscopy (Body Vision) was utilized during the course of this procedure to assure that biopsies were taken in a safe manner under fluoroscopic guidance with spot films required.  Total fluoroscopy time: 3 minutes 33 seconds, total dose: 25 mGy.  Intraoperative images:  Left upper lobe erythema but no distinct mass:   Fluoroscopy image during lingula sampling:   Airway narrowing at distal bronchus noted on navigation to lesion in the left upper lobe, see arrow:   Cellvizio images of the left upper lobe nodule/mass:       Acquisition of second target in the lingula:   Cellvizio image of the nodule in the lingula:   EBUS images: Left hilar station:   Subcarinal station subcentimeter lymph nodes, benign  appearing:   Complications:None, no pneumothorax on post film:   Estimated Blood Loss: Nil    Assessment and Plan/Additional Comments: 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 Derrill Kay, MD Advanced Bronchoscopy PCCM [facility]    *This note was dictated using voice recognition software/Dragon.  Despite best efforts to proofread, errors can occur which can change the meaning.  Any change was purely unintentional.

## 2021-11-19 NOTE — Progress Notes (Signed)
   11/19/21 1200  Clinical Encounter Type  Visited With Patient  Visit Type Initial;Pre-op   Chaplain provided pre-op support

## 2021-11-19 NOTE — Discharge Instructions (Addendum)
AMBULATORY SURGERY  DISCHARGE INSTRUCTIONS   The drugs that you were given will stay in your system until tomorrow so for the next 24 hours you should not:  Drive an automobile Make any legal decisions Drink any alcoholic beverage   You may resume regular meals tomorrow.  Today it is better to start with liquids and gradually work up to solid foods.  You may eat anything you prefer, but it is better to start with liquids, then soup and crackers, and gradually work up to solid foods.   Please notify your doctor immediately if you have any unusual bleeding, trouble breathing, redness and pain at the surgery site, drainage, fever, or pain not relieved by medication.    Additional Instructions:  Per Dr. Patsey Berthold, you will get a call Wednesday or Thursday with your results and then a follow up appointment will be scheduled. Dr. Patsey Berthold also says that you will need a MRI of your hip related to pain, that they will be scheduling.       Please contact your physician with any problems or Same Day Surgery at [phone], Monday through Friday 6 am to 4 pm, or [place] at [facility] number at [phone].

## 2021-11-19 NOTE — Telephone Encounter (Signed)
Patient has abnormal imaging by PET/CT of the pelvic bony structures.  This could represent metastatic disease.  She is being evaluated for lung cancer.  Radiologist recommended dedicated MRI of the pelvis, see PET/CT report of 14 November 2021.  Renold Don, MD Advanced Bronchoscopy PCCM [facility]

## 2021-11-19 NOTE — Interval H&P Note (Signed)
Victoria Foley has presented today for surgery, with the diagnosis of left upper lobe nodule/mass and multiple lung nodules, left hilar adenopathy.  The various methods of treatment have been discussed with the patient and family. After consideration of risks, benefits and other options for treatment, the patient has consented to  Procedure(s) with comments: ROBOTIC ASSISTED BRONCHOSCOPY, ENDOBRONCHIAL ULTRASOUND and biopsies as necessary procedures as a surgical intervention.  The patient's history has been reviewed, patient examined, no change in status, stable for surgery.  I have reviewed the patient's chart and labs.  Questions were answered to the patient's satisfaction.  Benefits, limitations and potential complications of the procedure were discussed with the patient/family.  Complications from bronchoscopy are rare and most often minor, but if they occur they may include breathing difficulty, vocal cord spasm, hoarseness, slight fever, vomiting, dizziness, bronchospasm, infection, low blood oxygen, bleeding from biopsy site, or an allergic reaction to medications.  It is uncommon for patients to experience other more serious complications for example: Collapsed lung requiring chest tube placement, respiratory failure, heart attack and/or cardiac arrhythmia.  Patient agrees to proceed.  Renold Don, MD Advanced Bronchoscopy PCCM [facility]    *This note was dictated using voice recognition software/Dragon.  Despite best efforts to proofread, errors can occur which can change the meaning. Any transcriptional errors that result from this process are unintentional and may not be fully corrected at the time of dictation.

## 2021-11-19 NOTE — Transfer of Care (Signed)
Immediate Anesthesia Transfer of Care Note  Patient: Victoria Foley  Procedure(s) Performed: ROBOTIC ASSISTED NAVIGATIONAL BRONCHOSCOPY (Left)  Patient Location: PACU  Anesthesia Type:General  Level of Consciousness: awake, drowsy and patient cooperative  Airway & Oxygen Therapy: Patient Spontanous Breathing and Patient connected to face mask oxygen  Post-op Assessment: Report given to RN and Post -op Vital signs reviewed and stable  Post vital signs: Reviewed and stable  Last Vitals:  Vitals Value Taken Time  BP 139/68 11/19/21 1451  Temp    Pulse 93 11/19/21 1454  Resp 16 11/19/21 1454  SpO2 100 % 11/19/21 1454  Vitals shown include unvalidated device data.  Last Pain:  Vitals:   11/19/21 1109  TempSrc: Temporal  PainSc: 0-No pain         Complications: No notable events documented.
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
## 2021-11-20 NOTE — Anesthesia Postprocedure Evaluation (Signed)
Anesthesia Post Note  Patient: Victoria Foley  Procedure(s) Performed: ROBOTIC ASSISTED NAVIGATIONAL BRONCHOSCOPY (Left)  Patient location during evaluation: PACU Anesthesia Type: General Level of consciousness: awake and alert Pain management: pain level controlled Vital Signs Assessment: post-procedure vital signs reviewed and stable Respiratory status: spontaneous breathing, nonlabored ventilation, respiratory function stable and patient connected to nasal cannula oxygen Cardiovascular status: blood pressure returned to baseline and stable Postop Assessment: no apparent nausea or vomiting Anesthetic complications: no   No notable events documented.   Last Vitals:  Vitals:   11/19/21 1515 11/19/21 1527  BP: 138/71 (!) 149/80  Pulse: 93 96  Resp: 14 16  Temp: (!) 36.3 C   SpO2: 99% 96%    Last Pain:  Vitals:   11/19/21 1527  TempSrc:   PainSc: 0-No pain                 Molli Barrows

## 2021-11-21 ENCOUNTER — Telehealth: Payer: Self-pay | Admitting: Internal Medicine

## 2021-11-21 ENCOUNTER — Other Ambulatory Visit: Payer: Self-pay | Admitting: Anatomic Pathology

## 2021-11-21 ENCOUNTER — Encounter: Payer: Self-pay | Admitting: Internal Medicine

## 2021-11-21 DIAGNOSIS — M25551 Pain in right hip: Secondary | ICD-10-CM | POA: Diagnosis not present

## 2021-11-21 DIAGNOSIS — R2689 Other abnormalities of gait and mobility: Secondary | ICD-10-CM | POA: Diagnosis not present

## 2021-11-21 DIAGNOSIS — C3490 Malignant neoplasm of unspecified part of unspecified bronchus or lung: Secondary | ICD-10-CM | POA: Insufficient documentation

## 2021-11-21 LAB — CYTOLOGY - NON PAP

## 2021-11-21 LAB — SURGICAL PATHOLOGY

## 2021-11-21 NOTE — Telephone Encounter (Signed)
Spoke with pt today re bronchoscopy results after Dr. Merrie Roof   Dr. Patsey Berthold placed oncology referral and MRI pelvis based on PET scan 11/14/21  Pt states she will talk with her daughters will do local oncology but she is also consider [facility] in [place] or [facility] as well and having both local and distant teams I rec if this is what she desires  she is not a smoker nor been exposed to 2nd hand smoking   She will let me know after speaking to her daughters what she wants to do I will be in [place] x 2 weeks starting Monday and not back until 12/10/21 but will document this for covering teams and Dr. Patsey Berthold is aware  Dr. Olivia Mackie McLean-Scocuzza

## 2021-11-22 ENCOUNTER — Encounter: Payer: Self-pay | Admitting: Internal Medicine

## 2021-11-22 ENCOUNTER — Encounter: Payer: Self-pay | Admitting: *Deleted

## 2021-11-22 DIAGNOSIS — C3490 Malignant neoplasm of unspecified part of unspecified bronchus or lung: Secondary | ICD-10-CM

## 2021-11-22 NOTE — Progress Notes (Signed)
Referral received from Dr. Patsey Berthold for newly diagnosis adenocarcinoma. Reviewed upcoming appts with patient. Informed that it is recommended for her to have brain MRI to complete staging for lung cancer. Brain MRI scheduled with MRI of the pelvis on Tues 8/8 with arrival at 630pm. All questions answered during call. Contact info given and instructed to call with any questions or needs. Pt verbalized understanding.

## 2021-11-23 ENCOUNTER — Telehealth: Payer: Self-pay | Admitting: Internal Medicine

## 2021-11-23 NOTE — Telephone Encounter (Signed)
Patient would like a medical care referral for [place].

## 2021-11-25 NOTE — Addendum Note (Signed)
Addended by: Orland Mustard on: 11/25/2021 11:58 PM   Modules accepted: Orders

## 2021-11-26 NOTE — Telephone Encounter (Signed)
Placed 2 referrals  [facility]  And  Locally Dr. Janese Banks   Please schedule for this patient asap   Thank you

## 2021-11-27 ENCOUNTER — Encounter: Payer: Self-pay | Admitting: Pulmonary Disease

## 2021-11-27 ENCOUNTER — Ambulatory Visit
Admission: RE | Admit: 2021-11-27 | Discharge: 2021-11-27 | Disposition: A | Discharge status: Home / Self Care | Payer: PPO | Source: Ambulatory Visit | Attending: Pulmonary Disease | Admitting: Pulmonary Disease

## 2021-11-27 ENCOUNTER — Institutional Professional Consult (permissible substitution): Payer: PPO | Admitting: Radiation Oncology

## 2021-11-27 ENCOUNTER — Ambulatory Visit
Admission: RE | Admit: 2021-11-27 | Discharge: 2021-11-27 | Disposition: A | Discharge status: Home / Self Care | Payer: PPO | Source: Ambulatory Visit | Attending: Internal Medicine | Admitting: Internal Medicine

## 2021-11-27 DIAGNOSIS — G9389 Other specified disorders of brain: Secondary | ICD-10-CM | POA: Diagnosis not present

## 2021-11-27 DIAGNOSIS — S32401A Unspecified fracture of right acetabulum, initial encounter for closed fracture: Secondary | ICD-10-CM | POA: Diagnosis not present

## 2021-11-27 DIAGNOSIS — C3490 Malignant neoplasm of unspecified part of unspecified bronchus or lung: Secondary | ICD-10-CM | POA: Diagnosis not present

## 2021-11-27 DIAGNOSIS — M5126 Other intervertebral disc displacement, lumbar region: Secondary | ICD-10-CM | POA: Diagnosis not present

## 2021-11-27 DIAGNOSIS — M898X8 Other specified disorders of bone, other site: Secondary | ICD-10-CM | POA: Diagnosis not present

## 2021-11-27 DIAGNOSIS — R1900 Intra-abdominal and pelvic swelling, mass and lump, unspecified site: Secondary | ICD-10-CM | POA: Diagnosis not present

## 2021-11-27 DIAGNOSIS — M4316 Spondylolisthesis, lumbar region: Secondary | ICD-10-CM | POA: Diagnosis not present

## 2021-11-27 MED ORDER — GADOBUTROL 1 MMOL/ML IV SOLN
6.0000 mL | Freq: Once | INTRAVENOUS | Status: AC | PRN
  Administered 2021-11-27: 6 mL via INTRAVENOUS

## 2021-11-28 ENCOUNTER — Encounter: Payer: Self-pay | Admitting: *Deleted

## 2021-11-28 ENCOUNTER — Ambulatory Visit
Admission: RE | Admit: 2021-11-28 | Discharge: 2021-11-28 | Disposition: A | Discharge status: Home / Self Care | Payer: PPO | Source: Ambulatory Visit | Attending: Radiation Oncology | Admitting: Radiation Oncology

## 2021-11-28 ENCOUNTER — Encounter: Payer: Self-pay | Admitting: Internal Medicine

## 2021-11-28 ENCOUNTER — Inpatient Hospital Stay: Payer: PPO | Attending: Internal Medicine | Admitting: Internal Medicine

## 2021-11-28 ENCOUNTER — Other Ambulatory Visit: Payer: Self-pay

## 2021-11-28 ENCOUNTER — Inpatient Hospital Stay: Payer: PPO

## 2021-11-28 ENCOUNTER — Other Ambulatory Visit: Payer: Self-pay | Admitting: *Deleted

## 2021-11-28 VITALS — BP 145/80 | HR 85 | Temp 97.8°F | Resp 18 | Ht 59.02 in | Wt 144.6 lb

## 2021-11-28 DIAGNOSIS — I10 Essential (primary) hypertension: Secondary | ICD-10-CM | POA: Insufficient documentation

## 2021-11-28 DIAGNOSIS — R059 Cough, unspecified: Secondary | ICD-10-CM | POA: Insufficient documentation

## 2021-11-28 DIAGNOSIS — K8020 Calculus of gallbladder without cholecystitis without obstruction: Secondary | ICD-10-CM | POA: Insufficient documentation

## 2021-11-28 DIAGNOSIS — J90 Pleural effusion, not elsewhere classified: Secondary | ICD-10-CM | POA: Diagnosis not present

## 2021-11-28 DIAGNOSIS — Z8616 Personal history of COVID-19: Secondary | ICD-10-CM | POA: Insufficient documentation

## 2021-11-28 DIAGNOSIS — Z79899 Other long term (current) drug therapy: Secondary | ICD-10-CM | POA: Insufficient documentation

## 2021-11-28 DIAGNOSIS — C7951 Secondary malignant neoplasm of bone: Secondary | ICD-10-CM | POA: Insufficient documentation

## 2021-11-28 DIAGNOSIS — Z923 Personal history of irradiation: Secondary | ICD-10-CM | POA: Insufficient documentation

## 2021-11-28 DIAGNOSIS — Z803 Family history of malignant neoplasm of breast: Secondary | ICD-10-CM | POA: Insufficient documentation

## 2021-11-28 DIAGNOSIS — Z510 Encounter for antineoplastic radiation therapy: Secondary | ICD-10-CM | POA: Insufficient documentation

## 2021-11-28 DIAGNOSIS — Z800 Family history of malignant neoplasm of digestive organs: Secondary | ICD-10-CM | POA: Insufficient documentation

## 2021-11-28 DIAGNOSIS — C3490 Malignant neoplasm of unspecified part of unspecified bronchus or lung: Secondary | ICD-10-CM

## 2021-11-28 DIAGNOSIS — Z7952 Long term (current) use of systemic steroids: Secondary | ICD-10-CM | POA: Diagnosis not present

## 2021-11-28 DIAGNOSIS — Z5112 Encounter for antineoplastic immunotherapy: Secondary | ICD-10-CM | POA: Insufficient documentation

## 2021-11-28 DIAGNOSIS — M25551 Pain in right hip: Secondary | ICD-10-CM

## 2021-11-28 DIAGNOSIS — D649 Anemia, unspecified: Secondary | ICD-10-CM | POA: Diagnosis not present

## 2021-11-28 DIAGNOSIS — C3412 Malignant neoplasm of upper lobe, left bronchus or lung: Secondary | ICD-10-CM | POA: Insufficient documentation

## 2021-11-28 DIAGNOSIS — Z9221 Personal history of antineoplastic chemotherapy: Secondary | ICD-10-CM | POA: Insufficient documentation

## 2021-11-28 DIAGNOSIS — M129 Arthropathy, unspecified: Secondary | ICD-10-CM | POA: Insufficient documentation

## 2021-11-28 DIAGNOSIS — E538 Deficiency of other specified B group vitamins: Secondary | ICD-10-CM | POA: Insufficient documentation

## 2021-11-28 LAB — CBC WITH DIFFERENTIAL/PLATELET
Abs Immature Granulocytes: 0.01 10*3/uL (ref 0.00–0.07)
Basophils Absolute: 0 10*3/uL (ref 0.0–0.1)
Basophils Relative: 1 %
Eosinophils Absolute: 0 10*3/uL (ref 0.0–0.5)
Eosinophils Relative: 1 %
HCT: 37.4 % (ref 36.0–46.0)
Hemoglobin: 12.6 g/dL (ref 12.0–15.0)
Immature Granulocytes: 0 %
Lymphocytes Relative: 22 %
Lymphs Abs: 1.1 10*3/uL (ref 0.7–4.0)
MCH: 31.8 pg (ref 26.0–34.0)
MCHC: 33.7 g/dL (ref 30.0–36.0)
MCV: 94.4 fL (ref 80.0–100.0)
Monocytes Absolute: 0.3 10*3/uL (ref 0.1–1.0)
Monocytes Relative: 6 %
Neutro Abs: 3.3 10*3/uL (ref 1.7–7.7)
Neutrophils Relative %: 70 %
Platelets: 254 10*3/uL (ref 150–400)
RBC: 3.96 MIL/uL (ref 3.87–5.11)
RDW: 13 % (ref 11.5–15.5)
WBC: 4.7 10*3/uL (ref 4.0–10.5)
nRBC: 0 % (ref 0.0–0.2)

## 2021-11-28 LAB — COMPREHENSIVE METABOLIC PANEL
ALT: 13 U/L (ref 0–44)
AST: 23 U/L (ref 15–41)
Albumin: 4.1 g/dL (ref 3.5–5.0)
Alkaline Phosphatase: 186 U/L — ABNORMAL HIGH (ref 38–126)
Anion gap: 10 (ref 5–15)
BUN: 19 mg/dL (ref 8–23)
CO2: 24 mmol/L (ref 22–32)
Calcium: 9.7 mg/dL (ref 8.9–10.3)
Chloride: 106 mmol/L (ref 98–111)
Creatinine, Ser: 1.16 mg/dL — ABNORMAL HIGH (ref 0.44–1.00)
GFR, Estimated: 51 mL/min — ABNORMAL LOW (ref >60)
Glucose, Bld: 123 mg/dL — ABNORMAL HIGH (ref 70–99)
Potassium: 3.7 mmol/L (ref 3.5–5.1)
Sodium: 140 mmol/L (ref 135–145)
Total Bilirubin: 0.8 mg/dL (ref 0.3–1.2)
Total Protein: 7.5 g/dL (ref 6.5–8.1)

## 2021-11-28 LAB — TSH: TSH: 1.374 u[IU]/mL (ref 0.350–4.500)

## 2021-11-28 MED ORDER — TRAMADOL HCL 50 MG PO TABS
ORAL_TABLET | ORAL | 0 refills | Status: DC
  Filled 2021-11-28: qty 30, 7d supply, fill #0

## 2021-11-28 MED ORDER — TRAMADOL HCL 50 MG PO TABS: 50.0000 mg | ORAL_TABLET | Freq: Four times a day (QID) | ORAL | 0 refills | Status: AC | PRN

## 2021-11-28 MED ORDER — TRAMADOL HCL 50 MG PO TABS: 50.0000 mg | ORAL_TABLET | Freq: Four times a day (QID) | ORAL | 0 refills | Status: DC | PRN

## 2021-11-28 MED ORDER — TRAMADOL HCL 50 MG PO TABS
50.0000 mg | ORAL_TABLET | Freq: Four times a day (QID) | ORAL | 0 refills | Status: DC | PRN
Start: 2021-11-28 — End: 2021-11-28

## 2021-11-28 NOTE — Progress Notes (Signed)
Met with patient and her family during initial consult with Dr. Agrawal and Dr. Chrystal to discuss lung cancer diagnosis and treatment options. All questions answered during visit. Reviewed upcoming appts with patient and her family. Introduced to navigator services. Contact info given and instructed to call with any questions or needs. Pt and her family verbalized understanding.  

## 2021-11-28 NOTE — Telephone Encounter (Signed)
The purpose of the follow-up is routine after procedure.  If she is not having issues after the procedure the most important follow-ups for her are with oncology which she already has scheduled.  If she wishes to cancel the appointment that is okay.

## 2021-11-28 NOTE — Progress Notes (Signed)
DISCONTINUE ON PATHWAY REGIMEN - Non-Small Cell Lung  No Medical Intervention - Off Treatment.  REASON: Other Reason PRIOR TREATMENT: Off Treatment  START OFF PATHWAY REGIMEN - Non-Small Cell Lung   [id number]:Pembrolizumab 200 mg  IV D1 + Pemetrexed 500 mg/m2 IV D1 + Carboplatin AUC=5 IV D1 q21 Days:   A cycle is every 21 days:     Pembrolizumab      Pemetrexed      Carboplatin   **Always confirm dose/schedule in your pharmacy ordering system**  Patient Characteristics: Stage IV Metastatic, Nonsquamous, Awaiting Molecular Test Results and Need to Start Chemotherapy, PS = 0, 1 Therapeutic Status: Stage IV Metastatic Histology: Nonsquamous Cell Broad Molecular Profiling Status: Awaiting Molecular Test Results and Need to Start Chemotherapy ECOG Performance Status: 1 Intent of Therapy: Non-Curative / Palliative Intent, Discussed with Patient

## 2021-11-28 NOTE — Progress Notes (Unsigned)
[facility] NOTE  Patient Care Team: McLean-Scocuzza, Nino Glow, MD as PCP - General (Internal Medicine) Telford Nab, RN as Oncology Nurse Navigator  ASSESSMENT & PLAN:  Primary lung adenocarcinoma [facility]) We discussed about diagnosis of stage IV lung cancer, prognosis and treatment options.  The goal of the treatment will be palliative intent. Foundation 1 testing will be sent to look for targetable treatment options.   Discussed about combination of chemotherapy with immunotherapy based on keynote 189 trial. Eligible patients with previously untreated metastatic nonsquamous non-small-cell lung cancer without EGFR/ALK alterations were randomly assigned 2:1 to pembrolizumab 200 mg or placebo once every 3 weeks for up to 35 cycles with pemetrexed and investigator's choice of carboplatin/cisplatin for four cycles, followed by maintenance pemetrexed until disease progression or unacceptable toxicity. Primary end points were overall survival (OS) and progression-free survival (PFS). Among 616 randomly assigned patients (n = 410, pembrolizumab plus pemetrexed-platinum; n = 206, placebo plus pemetrexed-platinum), median time from random assignment to data cutoff (June 27, 2020) was 64.6 (range, 60.1-72.4) months. Hazard ratio (95% CI) for OS was 0.60 (0.50 to 0.72) and PFS was 0.50 (0.42 to 0.60) for pembrolizumab plus platinum-pemetrexed versus placebo plus platinum-pemetrexed.  Side effects were discussed including but not limited to nausea, vomiting, fever, chills, low blood counts, hair thinning, impaired kidney function, inflammation of various organs such as thyroid, colon, liver, lung, rash.  We will do folic acid, vitamin V03 and Decadron 4 mg 1 day prior and 3 days after pemetrexed. Chemotherapy teaching session will be scheduled. Port placement discussed. Patient is understandbly overwhelmed with the information. She will think about it and let me know.   Patient will  undergo CT simulation for RT to right hip tomorrow Dr. Donella Stade.  Plan is for 10 sessions of radiation therapy.  In the meantime, we may have foundation 1 results available.  If she does not have targeted mutations we will start her on chemoimmunotherapy after completing radiation to avoid combined hematologic toxicity.  MRI brain was reviewed. No evidence of intracranial metastasis. There is 1 cm indeterminate lesion in frontal calvarium.  We will monitor. Labs today  Pt is in the process to get second opinion from [place].     Right hip pain - secondary to metastatic disease.  - MRI pelvis showed IMPRESSION: 1. Abnormal marrow signal throughout the right ilium involving the right acetabulum, right superior pubic ramus and right inferior pubic ramus, right side of the sacrum and a smaller focus of abnormal marrow lesion in the left side of the sacrum. Findings are concerning for metastatic disease. 2. Nondisplaced pathologic fracture of the quadrilateral plate of the right acetabulum.  Plan for 10 sessions of RT starting Dr. Donella Stade for palliation  Prescribed tramadol 50 mg every 6 hours as needed for 2 weeks.   Orders Placed This Encounter  Procedures   CBC with Differential    Standing Status:   Future    Number of Occurrences:   1    Standing Expiration Date:   11/28/2022   Comprehensive metabolic panel    Standing Status:   Future    Number of Occurrences:   1    Standing Expiration Date:   11/28/2022   TSH    Standing Status:   Future    Number of Occurrences:   1    Standing Expiration Date:   11/29/2022    The total time spent in the appointment was 60 minutes encounter with patients including review of  chart and various tests results, discussions about plan of care and coordination of care plan   All questions were answered. The patient knows to call the clinic with any problems, questions or concerns. No barriers to learning was detected.  Victoria Canary, MD 8/10/202312:24  PM  CHIEF COMPLAINTS/PURPOSE OF CONSULTATION:  Newly diagnosed stage IV lung cancer  HISTORY OF PRESENTING ILLNESS:  Victoria Foley 71 y.o. female is here accompanied with daughter Lynelle Smoke and second daughter Lexine Baton over the phone.   Patient denies fever, chills, nausea, vomiting, shortness of breath, abdominal pain, bleeding, bowel or bladder issues. Occasional cough.  Energy level is good.  Appetite is good. Reports 5 lbs weight loss in the past few months.  Patient reports pain in right hip and pelvis. Has started using cane for ambulation, uses naproxen on and off. Requesting for better pain medications.    I have reviewed her chart and materials related to her cancer extensively and collaborated history with the patient. Summary of oncologic history is as follows: Oncology History  Primary lung adenocarcinoma ([place])  10/10/2021 Initial Diagnosis   Primary lung adenocarcinoma ([place]) Stage IV  Patient seen by PCP for wheezing for 2 weeks. Imaging with CXR followed by CT chest showed Spiculated 2.1 x 2.2 cm left upper lobe pulmonary nodule with associated pleural tethering. Innumerable subcentimeter pulmonary nodules throughout the lungs.      11/14/2021 PET scan   IMPRESSION: 1. Left suprahilar nodule with maximum SUV 4.5, and a more distal 2.2 cm left upper lobe nodule with maximum SUV of 5.5. 2. Innumerable scattered small pulmonary nodules, most of which are under 5 mm in diameter, throughout both lungs. Some are cavitary. Possibilities include cavitating hematogenous disseminated malignancy versus infectious etiology subset as septic emboli. Most of the nodules are stable, some are minimally enlarged compared to 10/10/2021. 3. Abnormal mild permeative type bony findings in the right hemipelvis associated with substantially accentuated metabolic activity. Possible associated pathologic fracture through the right quadrilateral plate and right acetabulum. The findings involve  the right ilium and ischium but also the right lateral sacral ala and a small focus in the left sacrum.    Pathology Results   She had transbronchial biopsies by Dr. Patsey Berthold on 11/19/21.  Pathology from LUL nodule showed adenocarcinoma, consistent with lung primary.    11/19/2021 Cancer Staging   Staging form: Lung, AJCC 8th Edition - Clinical stage from 11/19/2021: Stage IVB (cT1c, cM1c) - Signed by Victoria Canary, MD on 11/27/2021 Stage prefix: Initial diagnosis   12/18/2021 -  Chemotherapy   Patient is on Treatment Plan : LUNG Carboplatin (5) + Pemetrexed (500) + Pembrolizumab (200) D1 q21d Induction x 4 cycles / Maintenance Pemetrexed (500) + Pembrolizumab (200) D1 q21d       MEDICAL HISTORY:  Past Medical History:  Diagnosis Date   Anemia    Anxiety    Arthritis    COVID-19    03/2021   Hypertension    Pneumonia    Pre-diabetes    Prediabetes    Sciatica     SURGICAL HISTORY: Past Surgical History:  Procedure Laterality Date   CESAREAN SECTION     COLONOSCOPY W/ POLYPECTOMY     ECTOPIC PREGNANCY SURGERY     FEMUR SURGERY     left, s/p rod for fracture   TUBAL LIGATION      SOCIAL HISTORY: Social History   Socioeconomic History   Marital status: Widowed    Spouse name: Not on file   Number  of children: Not on file   Years of education: Not on file   Highest education level: Not on file  Occupational History   Not on file  Tobacco Use   Smoking status: Never   Smokeless tobacco: Never  Vaping Use   Vaping Use: Never used  Substance and Sexual Activity   Alcohol use: No   Drug use: Never   Sexual activity: Not on file  Other Topics Concern   Not on file  Social History Narrative   Lives in [place] but husband died 07-02-2020 motorcycle accident bday 10/19/20 married 53 years   No pets   2 daughters and 3 granddaughters      Work - [employer] retired 09/2019   Diet - regular diet   Exercise - none   Social Determinants of Health   Financial  Resource Strain: Low Risk  (12/19/2020)   Overall Financial Resource Strain (CARDIA)    Difficulty of Paying Living Expenses: Not hard at all  Food Insecurity: No Food Insecurity (12/19/2020)   Hunger Vital Sign    Worried About Running Out of Food in the Last Year: Never true    [place] in the Last Year: Never true  Transportation Needs: No Transportation Needs (12/19/2020)   PRAPARE - Hydrologist (Medical): No    Lack of Transportation (Non-Medical): No  Physical Activity: Not on file  Stress: No Stress Concern Present (12/19/2020)   [place]    Feeling of Stress : Not at all  Social Connections: Unknown (12/19/2020)   Social Connection and Isolation Panel [NHANES]    Frequency of Communication with Friends and Family: More than three times a week    Frequency of Social Gatherings with Friends and Family: Not on file    Attends Religious Services: Not on file    Active Member of Clubs or Organizations: Not on file    Attends Archivist Meetings: Not on file    Marital Status: Not on file  Intimate Partner Violence: Not At Risk (12/19/2020)   Humiliation, Afraid, Rape, and Kick questionnaire    Fear of Current or Ex-Partner: No    Emotionally Abused: No    Physically Abused: No    Sexually Abused: No    FAMILY HISTORY: Family History  Problem Relation Age of Onset   Diabetes Mother    Hypertension Mother    Heart disease Mother    Cancer Mother        breast and stomach   Breast cancer Mother 68   Diabetes Father    Hypertension Father    Heart disease Father    Kidney disease Father     ALLERGIES:  is allergic to diclofenac, lisinopril, and sulfa antibiotics.  MEDICATIONS:  Current Outpatient Medications  Medication Sig Dispense Refill   albuterol (VENTOLIN HFA) 108 (90 Base) MCG/ACT inhaler Inhale 2 puffs into the lungs every 6 (six) hours as needed for  wheezing or shortness of breath. 18 g 0   cholecalciferol (VITAMIN D3) 25 MCG (1000 UNIT) tablet Take 1,000 Units by mouth daily.     fluticasone (FLONASE) 50 MCG/ACT nasal spray USE TWO SPRAYS IN EACH NOSTRIL ONCE DAILY prn 16 g 12   loratadine (CLARITIN) 10 MG tablet TAKE ONE TABLET BY MOUTH DAILY AS NEEDED FOR ALLERGIES 90 tablet 3   losartan (COZAAR) 100 MG tablet Take 1 tablet (100 mg total) by mouth daily. [place]  tablet 3   Multiple Vitamin (ONE-DAILY MULTI-VITAMIN PO) Take by mouth daily.     nebivolol (BYSTOLIC) 2.5 MG tablet Take 1 tablet (2.5 mg total) by mouth daily. 90 tablet 3   Chromium Picolinate (CHROMIUM PICOLATE PO) Take by mouth daily. (Patient not taking: Reported on 11/28/2021)     Omega-3 Fatty Acids (FISH OIL) 1000 MG CAPS Take by mouth. (Patient not taking: Reported on 11/28/2021)     traMADol (ULTRAM) 50 MG tablet Take 1 tablet (50 mg total) by mouth every 6 (six) hours as needed for up to 15 days for moderate pain or severe pain. 60 tablet 0   traMADol (ULTRAM) 50 MG tablet Take one tablet by mouth every 6 hours as needed for up to 15 days for moderate or severe pain 60 tablet 0   Turmeric (QC TUMERIC COMPLEX PO) Take 2 tablets by mouth daily. (Patient not taking: Reported on 11/28/2021)     No current facility-administered medications for this visit.    REVIEW OF SYSTEMS:   Constitutional: Denies fevers, chills or abnormal night sweats Eyes: Denies blurriness of vision, double vision or watery eyes Ears, nose, mouth, throat, and face: Denies mucositis or sore throat Respiratory: Denies cough, dyspnea or wheezes Cardiovascular: Denies palpitation, chest discomfort or lower extremity swelling Gastrointestinal:  Denies nausea, heartburn or change in bowel habits Skin: Denies abnormal skin rashes Lymphatics: Denies new lymphadenopathy or easy bruising Neurological:Denies numbness, tingling or new weaknesses Behavioral/Psych: Mood is stable, no new changes  All other systems  were reviewed with the patient and are negative.  PHYSICAL EXAMINATION: ECOG PERFORMANCE STATUS: 2  Vitals:   11/28/21 0905  BP: (!) 145/80  Pulse: 85  Resp: 18  Temp: 97.8 F (36.6 C)  SpO2: 100%   Filed Weights   11/28/21 0905  Weight: 144 lb 9.6 oz (65.6 kg)    GENERAL:alert, no distress and comfortable SKIN: skin color, texture, turgor are normal, no rashes or significant lesions NECK: supple LUNGS: normal breathing effort HEART: regular rate, no lower extremity edema Musculoskeletal:no cyanosis of digits and no clubbing  PSYCH: alert & oriented x 3 with fluent speech NEURO: no focal motor/sensory deficits  LABORATORY DATA:  I have reviewed the data as listed Lab Results  Component Value Date   WBC 4.7 11/28/2021   HGB 12.6 11/28/2021   HCT 37.4 11/28/2021   MCV 94.4 11/28/2021   PLT 254 11/28/2021   Recent Labs    04/05/21 1106 10/04/21 1129 10/05/21 1128 11/28/21 0956  NA 140 140  --  140  K 4.5 4.5  --  3.7  CL 104 105  --  106  CO2 27 26  --  24  GLUCOSE 93 101*  --  123*  BUN 14 20  --  19  CREATININE 0.89 0.86  --  1.16*  CALCIUM 10.1 10.3  --  9.7  GFRNONAA  --   --   --  51*  PROT 7.7 7.4  --  7.5  ALBUMIN 4.6 4.5  --  4.1  AST 22 24  --  23  ALT 23 19  --  13  ALKPHOS 94 159* 176* 186*  BILITOT 0.9 0.7  --  0.8    RADIOGRAPHIC STUDIES: I have personally reviewed the radiological images as listed and agreed with the findings in the report. MR PELVIS W WO CONTRAST  Result Date: 11/28/2021 CLINICAL DATA:  Pelvic mass. Patient presents for further characterization. EXAM: MRI PELVIS WITHOUT AND WITH CONTRAST TECHNIQUE:  Multiplanar multisequence MR imaging of the pelvis was performed both before and after administration of intravenous contrast. CONTRAST:  61m GADAVIST GADOBUTROL 1 MMOL/ML IV SOLN COMPARISON:  PET-CT 11/14/2021 FINDINGS: Bones: Prior left hip ORIF. No acute left hip fracture, dislocation or avascular necrosis. No right proximal  femur fracture. Nondisplaced fracture of the quadrilateral plate of the right acetabulum through pathologic bone. Abnormal marrow signal throughout the right ilium involving the right acetabulum, right superior pubic ramus and right inferior pubic ramus. Abnormal marrow signal in the right side of the sacrum and a smaller focus of abnormal marrow lesion in the left side of the sacrum. Normal sacrum and sacroiliac joints. No SI joint widening or erosive changes. Grade 1 anterolisthesis of L4 on L5 secondary to moderate facet arthropathy. Mild broad-based disc bulge at L4-5. Articular cartilage and labrum Articular cartilage:  No chondral defect. Labrum: Grossly intact, but evaluation is limited by lack of intraarticular fluid. Joint or bursal effusion Joint effusion:  No hip joint effusion.  No SI joint effusion. Bursae:  Mild right iliopsoas bursitis. Muscles and tendons Flexors: Normal. Extensors: Normal. Abductors: Normal. Adductors: Normal. Gluteals: Mild muscle edema in the right gluteus minimus and medius muscles likely reflecting mild muscle strain. Hamstrings: Normal. Other findings No pelvic free fluid. No fluid collection or hematoma. No inguinal lymphadenopathy. No inguinal hernia. IMPRESSION: 1. Abnormal marrow signal throughout the right ilium involving the right acetabulum, right superior pubic ramus and right inferior pubic ramus, right side of the sacrum and a smaller focus of abnormal marrow lesion in the left side of the sacrum. Findings are concerning for metastatic disease. 2. Nondisplaced pathologic fracture of the quadrilateral plate of the right acetabulum. 3. Mild muscle edema in the right gluteus minimus and medius muscles likely reflecting mild muscle strain. Electronically Signed   By: HKathreen DevoidM.D.   On: 11/28/2021 09:46   MR Brain W Wo Contrast  Result Date: 11/28/2021 CLINICAL DATA:  Non-small cell lung cancer (NSCLC), staging Metastatic disease evaluation EXAM: MRI HEAD WITHOUT  AND WITH CONTRAST TECHNIQUE: Multiplanar, multiecho pulse sequences of the brain and surrounding structures were obtained without and with intravenous contrast. CONTRAST:  647mGADAVIST GADOBUTROL 1 MMOL/ML IV SOLN COMPARISON:  None Available. FINDINGS: Brain: There is no acute infarction or intracranial hemorrhage. There is no hydrocephalus or extra-axial fluid collection. Ventricles and sulci are normal in size and configuration. Minimal patchy T2 hyperintensity in the supratentorial white matter is nonspecific but could reflect minor chronic microvascular ischemic changes. No abnormal parenchymal enhancement. Vascular: Major vessel flow voids at the skull base are preserved. Skull and upper cervical spine: 1 cm enhancing lesion of the left frontal calvarium (series 32, image 109). Normal marrow signal is otherwise preserved. Sinuses/Orbits: Paranasal sinuses are aerated. Orbits are unremarkable. Other: Sella is unremarkable.  Mastoid air cells are clear. IMPRESSION: No evidence of intracranial metastatic disease. Indeterminate 1 cm left frontal calvarium lesion. Electronically Signed   By: PrMacy Mis.D.   On: 11/28/2021 09:17   DG Chest Port 1 View  Result Date: 11/19/2021 CLINICAL DATA:  Status post bronchoscopy EXAM: PORTABLE CHEST 1 VIEW COMPARISON:  Previous studies including CT chest done on 11/14/2021 FINDINGS: Cardiac size is within normal limits. There is homogeneous opacity in left suprahilar region. Diffuse nodularity is seen in both lungs. There is blunting of left lateral CP angle suggesting small effusion. There is no pneumothorax. IMPRESSION: There is homogeneous opacity in left suprahilar region suggesting pneumonia or underlying neoplasm. Diffuse nodularity in  both lungs suggests possible metastatic disease. Small left pleural effusion. Electronically Signed   By: Elmer Picker M.D.   On: 11/19/2021 15:08   DG C-Arm 1-60 Min-No Report  Result Date: 11/19/2021 Fluoroscopy was  utilized by the requesting physician.  No radiographic interpretation.   CT SUPER D CHEST WO [place] PILOT  Result Date: 11/14/2021 CLINICAL DATA:  Lung nodules, preprocedural for bronchoscopy EXAM: CT CHEST WITHOUT CONTRAST TECHNIQUE: Multidetector CT imaging of the chest was performed using thin slice collimation for electromagnetic bronchoscopy planning purposes, without intravenous contrast. RADIATION DOSE REDUCTION: This exam was performed according to the departmental dose-optimization program which includes automated exposure control, adjustment of the mA and/or kV according to patient size and/or use of iterative reconstruction technique. COMPARISON:  PET-CT 11/14/2021 and chest CT 10/10/2021 FINDINGS: Cardiovascular: Mild atherosclerotic calcification of the aortic arch. Mediastinum/Nodes: Unremarkable Lungs/Pleura: Innumerable scattered pulmonary nodules, most under 5 mm in diameter, many cavitary. Left upper lobe suprahilar lesion about 2.1 cm in diameter, with associated substantial density extending around the left upper lobe bronchial structures which are narrowed, and a second more distal/cephalad nodule measuring 2.4 by 2.2 cm on image 37 series 3 with surrounding airspace opacity. There is also some peripheral ground-glass opacity in the right upper lobe on image 31 series 3 likely from postobstructive pneumonitis related to the dominant lesions. As noted on the PET-CT, some of the smaller nodules are mildly enlarged compared to 10/10/2021 although for the most part the nodules are stable and very similar to that exam, without definite new nodules observed. Upper Abdomen: Cholelithiasis. Musculoskeletal: There is some faintly accentuated activity in the right transverse process of T8 on the PET-CT, without visible abnormality on the CT scan in this region. IMPRESSION: 1. Innumerable scattered pulmonary nodules, the majority under 5 mm in diameter, and many cavitary. Possibilities include  infectious process such as tuberculosis or fungal disease, or hematogenous metastatic disease with predilection for cavitation. Most of these nodules are stable from 10/10/2021, some are minimally larger. 2. Dominant left suprahilar lesion and adjacent more peripheral left upper lobe lesion, both just over 2 cm in diameter, potentially reflecting malignancy or manifestation of atypical infection such as tuberculosis. 3. Cholelithiasis. 4. No CT abnormalities observed in the vicinity of the mildly accentuated right transverse process activity at T8 on today's PET-CT. Electronically Signed   By: Van Clines M.D.   On: 11/14/2021 16:07   NM PET Image Initial (PI) Skull Base To Thigh (F-18 FDG)  Result Date: 11/14/2021 CLINICAL DATA:  Initial treatment strategy for pulmonary nodule. EXAM: NUCLEAR MEDICINE PET SKULL BASE TO THIGH TECHNIQUE: 8.1 mCi F-18 FDG was injected intravenously. Full-ring PET imaging was performed from the skull base to thigh after the radiotracer. CT data was obtained and used for attenuation correction and anatomic localization. Fasting blood glucose: One hundred four mg/dl COMPARISON:  Chest CT 10/10/2021 FINDINGS: Mediastinal blood pool activity: SUV max 2.3 Liver activity: SUV max NA NECK: No significant abnormal hypermetabolic activity in this region. Incidental CT findings: none CHEST: Left upper lobe suprahilar airspace opacity noted within immediately suprahilar component measuring about 1.9 cm in diameter with maximum SUV 4.5, and a somewhat more distal nodular component measuring 2.2 by 1.9 cm on image 61 of series 2 with maximum SUV 5.5. Surrounding mild airspace opacity noted. Innumerable small pulmonary nodules. Many of these have central lucency potentially early cavitation. Most of these nodules are under 0.5 cm in diameter. However, an index right lower lobe nodule measures 0.7 by  0.6 cm on image 66 series 3, formerly 0.6 by 0.6 cm on 10/10/2021. Many of the nodules are  stable and summer mildly enlarged compared to prior. I do not definitively see new nodules compared to the 10/10/2021 exam. For the most part these individual small nodules are not discernibly hypermetabolic on PET-CT, but are below sensitive PET-CT size thresholds. Incidental CT findings: none ABDOMEN/PELVIS: No significant abnormal hypermetabolic activity in this region. Incidental CT findings: Cholelithiasis. SKELETON: Indistinct cortical irregularity, abnormal hypermetabolic activity throughout most of the right iliac and extending down into the right ischium comp on accompanied by some areas of cortical irregularity and possible mild permeative findings. Possible pathologic fracture in the quadrilateral space and anterior acetabulum, questionably extending vertically in the iliac bone. Posterior right iliac involvement adjacent to the SI joint has a maximum SUV of 5.6. There is adjacent abnormal accentuated metabolic activity in the right sacral ala, particularly adjacent to the SI joint, maximum SUV 6.7. Subtle focus of accentuated metabolic activity in the left sacrum, maximum SUV 4.4. Faint focus of accentuated activity in the right lateral process of T8, maximum SUV 2.5 Incidental CT findings: IM nail in the femur traversing a mid femoral region of healed deformity. Grade 1 anterolisthesis at L4-5. IMPRESSION: 1. Left suprahilar nodule with maximum SUV 4.5, and a more distal 2.2 cm left upper lobe nodule with maximum SUV of 5.5. 2. Innumerable scattered small pulmonary nodules, most of which are under 5 mm in diameter, throughout both lungs. Some are cavitary. Possibilities include cavitating hematogenous disseminated malignancy versus infectious etiology subset as septic emboli. Most of the nodules are stable, some are minimally enlarged compared to 10/10/2021. 3. Abnormal mild permeative type bony findings in the right hemipelvis associated with substantially accentuated metabolic activity. Possible  associated pathologic fracture through the right quadrilateral plate and right acetabulum. The findings involve the right ilium and ischium but also the right lateral sacral ala and a small focus in the left sacrum. 4. Overall integrating these findings, possibilities include infectious disease such as tuberculosis involving the lungs with bony involvement of the pelvis; lung cancer with bony spread and potential hematogenous dissemination leading to the innumerable pulmonary nodules; or an unusual right pelvic osteosarcoma with metastatic disease to the lungs. I recommend dedicated MRI of the pelvis with and without contrast to further investigate the pelvic findings, and consider bronchoscopy for tissue sampling in the left suprahilar region. Also correlate with any constitutional symptoms for special risk factors for infectious disease. 5. Cholelithiasis. 6. Grade 1 anterolisthesis at L4-5. Electronically Signed   By: Van Clines M.D.   On: 11/14/2021 16:01

## 2021-11-28 NOTE — Patient Instructions (Addendum)
[facility] orders faxed to [place], copy faxed to pathology department for specimen to be sent as well  Message received from Dr. Johnanna Schneiders in pathology, request originally submitted for tissue from lung nodule biopsy of LUL. More tissue available in biopsy of left lingular so this will be sent instead. Update order with new specimen # and tissue information faxed to [facility], copy of pathology report faxed as well.
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
## 2021-11-28 NOTE — Assessment & Plan Note (Signed)
-   secondary to metastatic disease.  - MRI pelvis showed IMPRESSION: 1. Abnormal marrow signal throughout the right ilium involving the right acetabulum, right superior pubic ramus and right inferior pubic ramus, right side of the sacrum and a smaller focus of abnormal marrow lesion in the left side of the sacrum. Findings are concerning for metastatic disease. 2. Nondisplaced pathologic fracture of the quadrilateral plate of the right acetabulum.  Plan for 10 sessions of RT starting Dr. Donella Stade for palliation  Prescribed tramadol 50 mg every 6 hours as needed for 2 weeks.

## 2021-11-28 NOTE — Progress Notes (Deleted)
Non-Small Cell Lung - No Medical Intervention - Off Treatment.  Patient Characteristics: Stage IV Metastatic, Nonsquamous, Awaiting Molecular Test Results and Need to Start Chemotherapy, PS = 0, 1 Therapeutic Status: Stage IV Metastatic Histology: Nonsquamous Cell Broad Molecular Profiling Status: Awaiting Molecular Test Results and Need to Start Chemotherapy ECOG Performance Status: 1

## 2021-11-28 NOTE — Telephone Encounter (Signed)
Dr. Gonzalez, please advise. Thanks 

## 2021-11-28 NOTE — Assessment & Plan Note (Addendum)
We discussed about diagnosis of stage IV lung cancer, prognosis and treatment options.  The goal of the treatment will be palliative intent. [facility] testing will be sent to look for targetable treatment options.   Discussed about combination of chemotherapy with immunotherapy based on keynote 189 trial. Eligible patients with previously untreated metastatic nonsquamous non-small-cell lung cancer without EGFR/ALK alterations were randomly assigned 2:1 to pembrolizumab 200 mg or placebo once every 3 weeks for up to 35 cycles with pemetrexed and investigator's choice of carboplatin/cisplatin for four cycles, followed by maintenance pemetrexed until disease progression or unacceptable toxicity. Primary end points were overall survival (OS) and progression-free survival (PFS). Among 616 randomly assigned patients (n = 410, pembrolizumab plus pemetrexed-platinum; n = 206, placebo plus pemetrexed-platinum), median time from random assignment to data cutoff (June 27, 2020) was 64.6 (range, 60.1-72.4) months. Hazard ratio (95% CI) for OS was 0.60 (0.50 to 0.72) and PFS was 0.50 (0.42 to 0.60) for pembrolizumab plus platinum-pemetrexed versus placebo plus platinum-pemetrexed.  Side effects were discussed including but not limited to nausea, vomiting, fever, chills, low blood counts, hair thinning, impaired kidney function, inflammation of various organs such as thyroid, colon, liver, lung, rash.  We will do folic acid, vitamin W65 and Decadron 4 mg 1 day prior and 3 days after pemetrexed. Chemotherapy teaching session will be scheduled. Port placement discussed. Patient is understandbly overwhelmed with the information. She will think about it and let me know.   Patient will undergo CT simulation for RT to right hip tomorrow Dr. Donella Stade.  Plan is for 10 sessions of radiation therapy.  In the meantime, we may have [facility] 1 results available.  If she does not have targeted mutations we will start her on  chemoimmunotherapy after completing radiation to avoid combined hematologic toxicity.  MRI brain was reviewed. No evidence of intracranial metastasis. There is 1 cm indeterminate lesion in frontal calvarium.  We will monitor. Labs today  Pt is in the process to get second opinion from [place].

## 2021-11-29 ENCOUNTER — Other Ambulatory Visit: Payer: Self-pay

## 2021-11-29 ENCOUNTER — Ambulatory Visit
Admission: RE | Admit: 2021-11-29 | Discharge: 2021-11-29 | Disposition: A | Discharge status: Home / Self Care | Payer: PPO | Source: Ambulatory Visit | Attending: Radiation Oncology | Admitting: Radiation Oncology

## 2021-11-29 ENCOUNTER — Encounter: Payer: Self-pay | Admitting: *Deleted

## 2021-11-29 ENCOUNTER — Inpatient Hospital Stay: Payer: PPO

## 2021-11-29 ENCOUNTER — Encounter: Payer: Self-pay | Admitting: Internal Medicine

## 2021-11-29 DIAGNOSIS — C7951 Secondary malignant neoplasm of bone: Secondary | ICD-10-CM | POA: Diagnosis not present

## 2021-11-29 DIAGNOSIS — Z510 Encounter for antineoplastic radiation therapy: Secondary | ICD-10-CM | POA: Diagnosis not present

## 2021-11-29 DIAGNOSIS — C3412 Malignant neoplasm of upper lobe, left bronchus or lung: Secondary | ICD-10-CM | POA: Diagnosis not present

## 2021-11-29 NOTE — Progress Notes (Signed)
Met with patient and her daughter during CT simulation appt today. Pt stated that she has noticed that she is itching all over and feels like her eyes are puffy today. Informed pt to continue to monitor symptoms at this time. Encouraged topical lotion to help with dry skin and may take benadryl tonight to see if it helps with her itching. Reviewed upcoming appt for follow up and possible infusion on 8/29 with Dr. Darrall Dears. Pt informed that results from [facility] testing should be back by that appt. Results and possible treatment changes will be discussed with her at that time. Instructed pt to call with any further questions or needs. Pt verbalized understanding.

## 2021-11-30 ENCOUNTER — Other Ambulatory Visit: Payer: Self-pay | Admitting: *Deleted

## 2021-11-30 DIAGNOSIS — C3490 Malignant neoplasm of unspecified part of unspecified bronchus or lung: Secondary | ICD-10-CM

## 2021-12-01 ENCOUNTER — Other Ambulatory Visit: Payer: Self-pay

## 2021-12-03 DIAGNOSIS — C3412 Malignant neoplasm of upper lobe, left bronchus or lung: Secondary | ICD-10-CM | POA: Diagnosis not present

## 2021-12-03 DIAGNOSIS — Z510 Encounter for antineoplastic radiation therapy: Secondary | ICD-10-CM | POA: Diagnosis not present

## 2021-12-03 DIAGNOSIS — C7951 Secondary malignant neoplasm of bone: Secondary | ICD-10-CM | POA: Diagnosis not present

## 2021-12-04 ENCOUNTER — Encounter: Payer: Self-pay | Admitting: Internal Medicine

## 2021-12-04 ENCOUNTER — Ambulatory Visit: Payer: PPO | Admitting: Pulmonary Disease

## 2021-12-05 ENCOUNTER — Other Ambulatory Visit: Payer: Self-pay

## 2021-12-05 ENCOUNTER — Inpatient Hospital Stay (HOSPITAL_BASED_OUTPATIENT_CLINIC_OR_DEPARTMENT_OTHER): Payer: PPO | Admitting: Hospice and Palliative Medicine

## 2021-12-05 ENCOUNTER — Ambulatory Visit
Admission: RE | Admit: 2021-12-05 | Discharge: 2021-12-05 | Disposition: A | Discharge status: Home / Self Care | Payer: PPO | Source: Ambulatory Visit | Attending: Radiation Oncology | Admitting: Radiation Oncology

## 2021-12-05 DIAGNOSIS — Z510 Encounter for antineoplastic radiation therapy: Secondary | ICD-10-CM | POA: Diagnosis not present

## 2021-12-05 DIAGNOSIS — C3490 Malignant neoplasm of unspecified part of unspecified bronchus or lung: Secondary | ICD-10-CM

## 2021-12-05 DIAGNOSIS — C3412 Malignant neoplasm of upper lobe, left bronchus or lung: Secondary | ICD-10-CM | POA: Diagnosis not present

## 2021-12-05 DIAGNOSIS — C7951 Secondary malignant neoplasm of bone: Secondary | ICD-10-CM | POA: Diagnosis not present

## 2021-12-05 LAB — RAD ONC ARIA SESSION SUMMARY
Course Elapsed Days: 0
Plan Fractions Treated to Date: 1
Plan Prescribed Dose Per Fraction: 3 Gy
Plan Total Fractions Prescribed: 10
Plan Total Prescribed Dose: 30 Gy
Reference Point Dosage Given to Date: 3 Gy
Reference Point Session Dosage Given: 3 Gy
Session Number: 1

## 2021-12-05 NOTE — Progress Notes (Signed)
Multidisciplinary Oncology Council Documentation  KIRSI HUGH was presented by our [facility] on 12/05/2021, which included representatives from:  Palliative Care Dietitian  Physical/Occupational Therapist Nurse Navigator Genetics Speech Therapist Social work Survivorship RN Financial Navigator Research RN   Tayla currently presents with history of lung cancer  We reviewed previous medical and familial history, history of present illness, and recent lab results along with all available histopathologic and imaging studies. The [place] considered available treatment options and made the following recommendations/referrals:  Consider palliative care and nutrition and rehab screening   The MOC is a meeting of clinicians from various specialty areas who evaluate and discuss patients for whom a multidisciplinary approach is being considered. Final determinations in the plan of care are those of the provider(s).   Today's extended care, comprehensive team conference, Areej was not present for the discussion and was not examined.

## 2021-12-06 ENCOUNTER — Ambulatory Visit
Admission: RE | Admit: 2021-12-06 | Discharge: 2021-12-06 | Disposition: A | Discharge status: Home / Self Care | Payer: PPO | Source: Ambulatory Visit | Attending: Radiation Oncology | Admitting: Radiation Oncology

## 2021-12-06 ENCOUNTER — Other Ambulatory Visit: Payer: Self-pay

## 2021-12-06 DIAGNOSIS — Z510 Encounter for antineoplastic radiation therapy: Secondary | ICD-10-CM | POA: Diagnosis not present

## 2021-12-06 DIAGNOSIS — C3490 Malignant neoplasm of unspecified part of unspecified bronchus or lung: Secondary | ICD-10-CM | POA: Diagnosis not present

## 2021-12-06 LAB — RAD ONC ARIA SESSION SUMMARY
Course Elapsed Days: 1
Plan Fractions Treated to Date: 2
Plan Prescribed Dose Per Fraction: 3 Gy
Plan Total Fractions Prescribed: 10
Plan Total Prescribed Dose: 30 Gy
Reference Point Dosage Given to Date: 6 Gy
Reference Point Session Dosage Given: 3 Gy
Session Number: 2

## 2021-12-07 ENCOUNTER — Encounter: Payer: Self-pay | Admitting: Internal Medicine

## 2021-12-07 ENCOUNTER — Other Ambulatory Visit: Payer: Self-pay | Admitting: *Deleted

## 2021-12-07 ENCOUNTER — Other Ambulatory Visit: Payer: Self-pay

## 2021-12-07 ENCOUNTER — Other Ambulatory Visit: Payer: Self-pay | Admitting: Internal Medicine

## 2021-12-07 ENCOUNTER — Ambulatory Visit
Admission: RE | Admit: 2021-12-07 | Discharge: 2021-12-07 | Disposition: A | Discharge status: Home / Self Care | Payer: PPO | Source: Ambulatory Visit | Attending: Radiation Oncology | Admitting: Radiation Oncology

## 2021-12-07 ENCOUNTER — Telehealth: Payer: Self-pay | Admitting: *Deleted

## 2021-12-07 DIAGNOSIS — C3490 Malignant neoplasm of unspecified part of unspecified bronchus or lung: Secondary | ICD-10-CM

## 2021-12-07 DIAGNOSIS — Z510 Encounter for antineoplastic radiation therapy: Secondary | ICD-10-CM | POA: Diagnosis not present

## 2021-12-07 LAB — RAD ONC ARIA SESSION SUMMARY
Course Elapsed Days: 2
Plan Fractions Treated to Date: 3
Plan Prescribed Dose Per Fraction: 3 Gy
Plan Total Fractions Prescribed: 10
Plan Total Prescribed Dose: 30 Gy
Reference Point Dosage Given to Date: 9 Gy
Reference Point Session Dosage Given: 3 Gy
Session Number: 3

## 2021-12-07 MED ORDER — DEXAMETHASONE 4 MG PO TABS
ORAL_TABLET | ORAL | 0 refills | Status: DC
  Filled 2021-12-07: qty 6, 3d supply, fill #0

## 2021-12-07 MED ORDER — FOLIC ACID 1 MG PO TABS
1.0000 mg | ORAL_TABLET | Freq: Every day | ORAL | 3 refills | Status: DC
  Filled 2021-12-07: qty 100, 90d supply, fill #0
  Filled 2022-03-08: qty 100, 90d supply, fill #1

## 2021-12-07 MED ORDER — ONDANSETRON HCL 8 MG PO TABS
8.0000 mg | ORAL_TABLET | Freq: Three times a day (TID) | ORAL | 1 refills | Status: DC | PRN
  Filled 2021-12-07: qty 30, 10d supply, fill #0

## 2021-12-07 MED ORDER — LIDOCAINE-PRILOCAINE 2.5-2.5 % EX CREA
TOPICAL_CREAM | CUTANEOUS | 3 refills | Status: DC
  Filled 2021-12-07 – 2021-12-11 (×3): qty 30, 30d supply, fill #0

## 2021-12-07 MED ORDER — PROCHLORPERAZINE MALEATE 10 MG PO TABS
10.0000 mg | ORAL_TABLET | Freq: Four times a day (QID) | ORAL | 1 refills | Status: DC | PRN
  Filled 2021-12-07: qty 30, 8d supply, fill #0

## 2021-12-07 NOTE — Progress Notes (Signed)
Patient on schedule for Port placement 8/22, called and spoke with patient on phone with pre procedure instructions given. Made aware to be here at 0900, NPO after [place] prior to procedure as well as driver post procedure/recovery/discharge. Stated understanding.

## 2021-12-07 NOTE — Telephone Encounter (Signed)
Call placed to patient to review upcoming appointments for port placement on 8/22 and b12 injection on 8/23. IR RN Jocelyn Lamer has also reached out to discuss port placement instructions for 8/22. Patient denies any concerns or questions at this time and is agreeable to current plan. Encouraged to call with any needs or concerns.

## 2021-12-08 ENCOUNTER — Other Ambulatory Visit: Payer: Self-pay

## 2021-12-10 ENCOUNTER — Other Ambulatory Visit: Payer: Self-pay

## 2021-12-10 ENCOUNTER — Other Ambulatory Visit (HOSPITAL_COMMUNITY): Payer: Self-pay | Admitting: Physician Assistant

## 2021-12-10 ENCOUNTER — Ambulatory Visit
Admission: RE | Admit: 2021-12-10 | Discharge: 2021-12-10 | Disposition: A | Discharge status: Home / Self Care | Payer: PPO | Source: Ambulatory Visit | Attending: Radiation Oncology | Admitting: Radiation Oncology

## 2021-12-10 DIAGNOSIS — C3492 Malignant neoplasm of unspecified part of left bronchus or lung: Secondary | ICD-10-CM | POA: Diagnosis not present

## 2021-12-10 DIAGNOSIS — C7951 Secondary malignant neoplasm of bone: Secondary | ICD-10-CM | POA: Diagnosis not present

## 2021-12-10 DIAGNOSIS — Z510 Encounter for antineoplastic radiation therapy: Secondary | ICD-10-CM | POA: Diagnosis not present

## 2021-12-10 LAB — RAD ONC ARIA SESSION SUMMARY
Course Elapsed Days: 5
Plan Fractions Treated to Date: 4
Plan Prescribed Dose Per Fraction: 3 Gy
Plan Total Fractions Prescribed: 10
Plan Total Prescribed Dose: 30 Gy
Reference Point Dosage Given to Date: 12 Gy
Reference Point Session Dosage Given: 3 Gy
Session Number: 4

## 2021-12-10 NOTE — H&P (Signed)
Chief Complaint: Patient was seen in consultation today for stage IV lung adenocarcinoma at the request of [place]  Referring Physician(s): [place]  Supervising Physician: Jacqulynn Cadet  Patient Status: [facility] - Out-pt  History of Present Illness: Victoria Foley is a 71 y.o. female diagnosed with stage VI lung adenocarcinoma after presenting to PCP in June 2023 c/o wheezing x 2 weeks. CXR followed by CT chest showed spiculated 2.1 x 2.2 cm LUL pulmonary nodule.Pt was referred to Dr. Darrall Dears, oncology. She is currently receiving chemotherapy and his been referred to IR for tunneled catheter with port to continue treatment.   Past Medical History:  Diagnosis Date   Anemia    Anxiety    Arthritis    COVID-19    03/2021   Hypertension    Pneumonia    Pre-diabetes    Prediabetes    Sciatica     Past Surgical History:  Procedure Laterality Date   CESAREAN SECTION     COLONOSCOPY W/ POLYPECTOMY     ECTOPIC PREGNANCY SURGERY     FEMUR SURGERY     left, s/p rod for fracture   TUBAL LIGATION      Allergies: Diclofenac, Lisinopril, and Sulfa antibiotics  Medications: Prior to Admission medications   Medication Sig Start Date End Date Taking? Authorizing Provider  albuterol (VENTOLIN HFA) 108 (90 Base) MCG/ACT inhaler Inhale 2 puffs into the lungs every 6 (six) hours as needed for wheezing or shortness of breath. 10/04/21   McLean-Scocuzza, Nino Glow, MD  cholecalciferol (VITAMIN D3) 25 MCG (1000 UNIT) tablet Take 1,000 Units by mouth daily.    [provider]  Chromium Picolinate (CHROMIUM PICOLATE PO) Take by mouth daily. Patient not taking: Reported on 11/28/2021    [provider]  dexamethasone (DECADRON) 4 MG tablet Take 1 tab 2 times daily starting day before pemetrexed. Then take 2 tabs daily x 2 days starting day after carboplatin. Take with food. 12/07/21   Jane Canary, MD  fluticasone (FLONASE) 50 MCG/ACT nasal spray USE TWO  SPRAYS IN EACH NOSTRIL ONCE DAILY prn 10/04/21   McLean-Scocuzza, Nino Glow, MD  folic acid (FOLVITE) 1 MG tablet Take 1 tablet (1 mg total) by mouth daily. Start 7 days before pemetrexed chemotherapy. Continue until 21 days after pemetrexed completed. 12/07/21   Jane Canary, MD  lidocaine-prilocaine (EMLA) cream Apply to port site 30 mins prior using once 12/07/21   Jane Canary, MD  loratadine (CLARITIN) 10 MG tablet TAKE ONE TABLET BY MOUTH DAILY AS NEEDED FOR ALLERGIES 10/04/21   McLean-Scocuzza, Nino Glow, MD  losartan (COZAAR) 100 MG tablet Take 1 tablet (100 mg total) by mouth daily. 10/04/21   McLean-Scocuzza, Nino Glow, MD  Multiple Vitamin (ONE-DAILY MULTI-VITAMIN PO) Take by mouth daily.    [provider]  nebivolol (BYSTOLIC) 2.5 MG tablet Take 1 tablet (2.5 mg total) by mouth daily. 10/04/21   McLean-Scocuzza, Nino Glow, MD  Omega-3 Fatty Acids (FISH OIL) 1000 MG CAPS Take by mouth. Patient not taking: Reported on 11/28/2021    [provider]  ondansetron (ZOFRAN) 8 MG tablet Take 1 tablet (8 mg total) by mouth every 8 (eight) hours as needed for nausea or vomiting. Start on the third day after carboplatin. 12/07/21   Jane Canary, MD  prochlorperazine (COMPAZINE) 10 MG tablet Take 1 tablet (10 mg total) by mouth every 6 (six) hours as needed for nausea or vomiting. 12/07/21   Jane Canary, MD  traMADol (ULTRAM) 50 MG tablet Take 1  tablet (50 mg total) by mouth every 6 (six) hours as needed for up to 15 days for moderate pain or severe pain. 11/28/21 12/13/21  Jane Canary, MD  traMADol Veatrice Bourbon) 50 MG tablet Take one tablet by mouth every 6 hours as needed for up to 15 days for moderate or severe pain 11/28/21   Jane Canary, MD  Turmeric (QC TUMERIC COMPLEX PO) Take 2 tablets by mouth daily. Patient not taking: Reported on 11/28/2021    [provider]     Family History  Problem Relation Age of Onset   Diabetes Mother    Hypertension Mother    Heart disease  Mother    Cancer Mother        breast and stomach   Breast cancer Mother 35   Diabetes Father    Hypertension Father    Heart disease Father    Kidney disease Father     Social History   Socioeconomic History   Marital status: Widowed    Spouse name: Not on file   Number of children: Not on file   Years of education: Not on file   Highest education level: Not on file  Occupational History   Not on file  Tobacco Use   Smoking status: Never   Smokeless tobacco: Never  Vaping Use   Vaping Use: Never used  Substance and Sexual Activity   Alcohol use: No   Drug use: Never   Sexual activity: Not Currently  Other Topics Concern   Not on file  Social History Narrative   Lives in [place] but husband died Jun 28, 2020 motorcycle accident bday 10/19/20 married 72 years   No pets   2 daughters and 3 granddaughters      Work - [employer] retired 09/2019   Diet - regular diet   Exercise - none   Social Determinants of Health   Financial Resource Strain: Low Risk  (12/19/2020)   Overall Financial Resource Strain (CARDIA)    Difficulty of Paying Living Expenses: Not hard at all  Food Insecurity: No Food Insecurity (12/19/2020)   Hunger Vital Sign    Worried About Running Out of Food in the Last Year: Never true    [place] in the Last Year: Never true  Transportation Needs: No Transportation Needs (12/19/2020)   PRAPARE - Hydrologist (Medical): No    Lack of Transportation (Non-Medical): No  Physical Activity: Not on file  Stress: No Stress Concern Present (12/19/2020)   [place]    Feeling of Stress : Not at all  Social Connections: Unknown (12/19/2020)   Social Connection and Isolation Panel [NHANES]    Frequency of Communication with Friends and Family: More than three times a week    Frequency of Social Gatherings with Friends and Family: Not on file    Attends Religious  Services: Not on file    Active Member of Clubs or Organizations: Not on file    Attends Archivist Meetings: Not on file    Marital Status: Not on file     Review of Systems: A 12 point ROS discussed and pertinent positives are indicated in the HPI above.  All other systems are negative.  Review of Systems  All other systems reviewed and are negative.   Vital Signs: BP (!) 143/77   Pulse 74   Temp 98 F (36.7 C) (Oral)   Resp 17   Ht  4\' 11"  (1.499 m)   Wt 147 lb (66.7 kg)   SpO2 97%   BMI 29.69 kg/m     Physical Exam Vitals reviewed.  Constitutional:      General: She is not in acute distress.    Appearance: Normal appearance. She is not ill-appearing.  HENT:     Head: Normocephalic and atraumatic.     Mouth/Throat:     Mouth: Mucous membranes are dry.     Pharynx: Oropharynx is clear.  Eyes:     Extraocular Movements: Extraocular movements intact.     Pupils: Pupils are equal, round, and reactive to light.  Cardiovascular:     Rate and Rhythm: Normal rate and regular rhythm.     Pulses: Normal pulses.     Heart sounds: Normal heart sounds.  Pulmonary:     Effort: Pulmonary effort is normal. No respiratory distress.     Breath sounds: Normal breath sounds.  Abdominal:     General: Bowel sounds are normal. There is no distension.     Tenderness: There is no abdominal tenderness. There is no guarding.  Musculoskeletal:     Right lower leg: No edema.     Left lower leg: No edema.  Skin:    General: Skin is warm and dry.  Neurological:     Mental Status: She is alert and oriented to person, place, and time.  Psychiatric:        Mood and Affect: Mood normal.        Behavior: Behavior normal.        Thought Content: Thought content normal.        Judgment: Judgment normal.     Imaging: MR PELVIS W WO CONTRAST  Result Date: 11/28/2021 CLINICAL DATA:  Pelvic mass. Patient presents for further characterization. EXAM: MRI PELVIS WITHOUT AND WITH  CONTRAST TECHNIQUE: Multiplanar multisequence MR imaging of the pelvis was performed both before and after administration of intravenous contrast. CONTRAST:  42mL GADAVIST GADOBUTROL 1 MMOL/ML IV SOLN COMPARISON:  PET-CT 11/14/2021 FINDINGS: Bones: Prior left hip ORIF. No acute left hip fracture, dislocation or avascular necrosis. No right proximal femur fracture. Nondisplaced fracture of the quadrilateral plate of the right acetabulum through pathologic bone. Abnormal marrow signal throughout the right ilium involving the right acetabulum, right superior pubic ramus and right inferior pubic ramus. Abnormal marrow signal in the right side of the sacrum and a smaller focus of abnormal marrow lesion in the left side of the sacrum. Normal sacrum and sacroiliac joints. No SI joint widening or erosive changes. Grade 1 anterolisthesis of L4 on L5 secondary to moderate facet arthropathy. Mild broad-based disc bulge at L4-5. Articular cartilage and labrum Articular cartilage:  No chondral defect. Labrum: Grossly intact, but evaluation is limited by lack of intraarticular fluid. Joint or bursal effusion Joint effusion:  No hip joint effusion.  No SI joint effusion. Bursae:  Mild right iliopsoas bursitis. Muscles and tendons Flexors: Normal. Extensors: Normal. Abductors: Normal. Adductors: Normal. Gluteals: Mild muscle edema in the right gluteus minimus and medius muscles likely reflecting mild muscle strain. Hamstrings: Normal. Other findings No pelvic free fluid. No fluid collection or hematoma. No inguinal lymphadenopathy. No inguinal hernia. IMPRESSION: 1. Abnormal marrow signal throughout the right ilium involving the right acetabulum, right superior pubic ramus and right inferior pubic ramus, right side of the sacrum and a smaller focus of abnormal marrow lesion in the left side of the sacrum. Findings are concerning for metastatic disease. 2. Nondisplaced pathologic fracture of  the quadrilateral plate of the right  acetabulum. 3. Mild muscle edema in the right gluteus minimus and medius muscles likely reflecting mild muscle strain. Electronically Signed   By: Kathreen Devoid M.D.   On: 11/28/2021 09:46   MR Brain W Wo Contrast  Result Date: 11/28/2021 CLINICAL DATA:  Non-small cell lung cancer (NSCLC), staging Metastatic disease evaluation EXAM: MRI HEAD WITHOUT AND WITH CONTRAST TECHNIQUE: Multiplanar, multiecho pulse sequences of the brain and surrounding structures were obtained without and with intravenous contrast. CONTRAST:  87mL GADAVIST GADOBUTROL 1 MMOL/ML IV SOLN COMPARISON:  None Available. FINDINGS: Brain: There is no acute infarction or intracranial hemorrhage. There is no hydrocephalus or extra-axial fluid collection. Ventricles and sulci are normal in size and configuration. Minimal patchy T2 hyperintensity in the supratentorial white matter is nonspecific but could reflect minor chronic microvascular ischemic changes. No abnormal parenchymal enhancement. Vascular: Major vessel flow voids at the skull base are preserved. Skull and upper cervical spine: 1 cm enhancing lesion of the left frontal calvarium (series 32, image 109). Normal marrow signal is otherwise preserved. Sinuses/Orbits: Paranasal sinuses are aerated. Orbits are unremarkable. Other: Sella is unremarkable.  Mastoid air cells are clear. IMPRESSION: No evidence of intracranial metastatic disease. Indeterminate 1 cm left frontal calvarium lesion. Electronically Signed   By: Macy Mis M.D.   On: 11/28/2021 09:17   DG Chest Port 1 View  Result Date: 11/19/2021 CLINICAL DATA:  Status post bronchoscopy EXAM: PORTABLE CHEST 1 VIEW COMPARISON:  Previous studies including CT chest done on 11/14/2021 FINDINGS: Cardiac size is within normal limits. There is homogeneous opacity in left suprahilar region. Diffuse nodularity is seen in both lungs. There is blunting of left lateral CP angle suggesting small effusion. There is no pneumothorax. IMPRESSION:  There is homogeneous opacity in left suprahilar region suggesting pneumonia or underlying neoplasm. Diffuse nodularity in both lungs suggests possible metastatic disease. Small left pleural effusion. Electronically Signed   By: Elmer Picker M.D.   On: 11/19/2021 15:08   DG C-Arm 1-60 Min-No Report  Result Date: 11/19/2021 Fluoroscopy was utilized by the requesting physician.  No radiographic interpretation.   CT SUPER D CHEST WO MONARCH PILOT  Result Date: 11/14/2021 CLINICAL DATA:  Lung nodules, preprocedural for bronchoscopy EXAM: CT CHEST WITHOUT CONTRAST TECHNIQUE: Multidetector CT imaging of the chest was performed using thin slice collimation for electromagnetic bronchoscopy planning purposes, without intravenous contrast. RADIATION DOSE REDUCTION: This exam was performed according to the departmental dose-optimization program which includes automated exposure control, adjustment of the mA and/or kV according to patient size and/or use of iterative reconstruction technique. COMPARISON:  PET-CT 11/14/2021 and chest CT 10/10/2021 FINDINGS: Cardiovascular: Mild atherosclerotic calcification of the aortic arch. Mediastinum/Nodes: Unremarkable Lungs/Pleura: Innumerable scattered pulmonary nodules, most under 5 mm in diameter, many cavitary. Left upper lobe suprahilar lesion about 2.1 cm in diameter, with associated substantial density extending around the left upper lobe bronchial structures which are narrowed, and a second more distal/cephalad nodule measuring 2.4 by 2.2 cm on image 37 series 3 with surrounding airspace opacity. There is also some peripheral ground-glass opacity in the right upper lobe on image 31 series 3 likely from postobstructive pneumonitis related to the dominant lesions. As noted on the PET-CT, some of the smaller nodules are mildly enlarged compared to 10/10/2021 although for the most part the nodules are stable and very similar to that exam, without definite new nodules  observed. Upper Abdomen: Cholelithiasis. Musculoskeletal: There is some faintly accentuated activity in the right transverse process  of T8 on the PET-CT, without visible abnormality on the CT scan in this region. IMPRESSION: 1. Innumerable scattered pulmonary nodules, the majority under 5 mm in diameter, and many cavitary. Possibilities include infectious process such as tuberculosis or fungal disease, or hematogenous metastatic disease with predilection for cavitation. Most of these nodules are stable from 10/10/2021, some are minimally larger. 2. Dominant left suprahilar lesion and adjacent more peripheral left upper lobe lesion, both just over 2 cm in diameter, potentially reflecting malignancy or manifestation of atypical infection such as tuberculosis. 3. Cholelithiasis. 4. No CT abnormalities observed in the vicinity of the mildly accentuated right transverse process activity at T8 on today's PET-CT. Electronically Signed   By: Van Clines M.D.   On: 11/14/2021 16:07   NM PET Image Initial (PI) Skull Base To Thigh (F-18 FDG)  Result Date: 11/14/2021 CLINICAL DATA:  Initial treatment strategy for pulmonary nodule. EXAM: NUCLEAR MEDICINE PET SKULL BASE TO THIGH TECHNIQUE: 8.1 mCi F-18 FDG was injected intravenously. Full-ring PET imaging was performed from the skull base to thigh after the radiotracer. CT data was obtained and used for attenuation correction and anatomic localization. Fasting blood glucose: One hundred four mg/dl COMPARISON:  Chest CT 10/10/2021 FINDINGS: Mediastinal blood pool activity: SUV max 2.3 Liver activity: SUV max NA NECK: No significant abnormal hypermetabolic activity in this region. Incidental CT findings: none CHEST: Left upper lobe suprahilar airspace opacity noted within immediately suprahilar component measuring about 1.9 cm in diameter with maximum SUV 4.5, and a somewhat more distal nodular component measuring 2.2 by 1.9 cm on image 61 of series 2 with maximum SUV  5.5. Surrounding mild airspace opacity noted. Innumerable small pulmonary nodules. Many of these have central lucency potentially early cavitation. Most of these nodules are under 0.5 cm in diameter. However, an index right lower lobe nodule measures 0.7 by 0.6 cm on image 66 series 3, formerly 0.6 by 0.6 cm on 10/10/2021. Many of the nodules are stable and summer mildly enlarged compared to prior. I do not definitively see new nodules compared to the 10/10/2021 exam. For the most part these individual small nodules are not discernibly hypermetabolic on PET-CT, but are below sensitive PET-CT size thresholds. Incidental CT findings: none ABDOMEN/PELVIS: No significant abnormal hypermetabolic activity in this region. Incidental CT findings: Cholelithiasis. SKELETON: Indistinct cortical irregularity, abnormal hypermetabolic activity throughout most of the right iliac and extending down into the right ischium comp on accompanied by some areas of cortical irregularity and possible mild permeative findings. Possible pathologic fracture in the quadrilateral space and anterior acetabulum, questionably extending vertically in the iliac bone. Posterior right iliac involvement adjacent to the SI joint has a maximum SUV of 5.6. There is adjacent abnormal accentuated metabolic activity in the right sacral ala, particularly adjacent to the SI joint, maximum SUV 6.7. Subtle focus of accentuated metabolic activity in the left sacrum, maximum SUV 4.4. Faint focus of accentuated activity in the right lateral process of T8, maximum SUV 2.5 Incidental CT findings: IM nail in the femur traversing a mid femoral region of healed deformity. Grade 1 anterolisthesis at L4-5. IMPRESSION: 1. Left suprahilar nodule with maximum SUV 4.5, and a more distal 2.2 cm left upper lobe nodule with maximum SUV of 5.5. 2. Innumerable scattered small pulmonary nodules, most of which are under 5 mm in diameter, throughout both lungs. Some are cavitary.  Possibilities include cavitating hematogenous disseminated malignancy versus infectious etiology subset as septic emboli. Most of the nodules are stable, some are minimally enlarged  compared to 10/10/2021. 3. Abnormal mild permeative type bony findings in the right hemipelvis associated with substantially accentuated metabolic activity. Possible associated pathologic fracture through the right quadrilateral plate and right acetabulum. The findings involve the right ilium and ischium but also the right lateral sacral ala and a small focus in the left sacrum. 4. Overall integrating these findings, possibilities include infectious disease such as tuberculosis involving the lungs with bony involvement of the pelvis; lung cancer with bony spread and potential hematogenous dissemination leading to the innumerable pulmonary nodules; or an unusual right pelvic osteosarcoma with metastatic disease to the lungs. I recommend dedicated MRI of the pelvis with and without contrast to further investigate the pelvic findings, and consider bronchoscopy for tissue sampling in the left suprahilar region. Also correlate with any constitutional symptoms for special risk factors for infectious disease. 5. Cholelithiasis. 6. Grade 1 anterolisthesis at L4-5. Electronically Signed   By: Van Clines M.D.   On: 11/14/2021 16:01    Labs:  CBC: Recent Labs    04/05/21 1106 10/04/21 1129 11/28/21 0956  WBC 3.0* 4.3 4.7  HGB 13.5 12.9 12.6  HCT 40.1 39.1 37.4  PLT 285.0 235.0 254    COAGS: No results for input(s): "INR", "APTT" in the last 8760 hours.  BMP: Recent Labs    04/05/21 1106 10/04/21 1129 11/28/21 0956  NA 140 140 140  K 4.5 4.5 3.7  CL 104 105 106  CO2 27 26 24   GLUCOSE 93 101* 123*  BUN 14 20 19   CALCIUM 10.1 10.3 9.7  CREATININE 0.89 0.86 1.16*  GFRNONAA  --   --  51*    LIVER FUNCTION TESTS: Recent Labs    04/05/21 1106 10/04/21 1129 10/05/21 1128 11/28/21 0956  BILITOT 0.9 0.7  --   0.8  AST 22 24  --  23  ALT 23 19  --  13  ALKPHOS 94 159* 176* 186*  PROT 7.7 7.4  --  7.5  ALBUMIN 4.6 4.5  --  4.1    TUMOR MARKERS: No results for input(s): "AFPTM", "CEA", "CA199", "CHROMGRNA" in the last 8760 hours.  Assessment and Plan: Victoria Foley is a 71 yo female diagnosed with stage VI lung adenocarcinoma after presenting to PCP in June 2023 c/o wheezing x 2 weeks. CXR followed by CT chest showed spiculated 2.1 x 2.2 cm LUL pulmonary nodule.Pt was referred to Dr. Darrall Dears, oncology. She is currently receiving chemotherapy and his been referred to IR for tunneled catheter with port to continue treatment.   Pt resting on stretcher. She is A&O, calm and pleasant with daughter at bedside.   She is in no distress.  Pt is NPO per order.   Risks and benefits of image guided tunneled catheter with port placement was discussed with the patient including, but not limited to bleeding, infection, pneumothorax, or fibrin sheath development and need for additional procedures.  All of the patient's questions were answered, patient is agreeable to proceed. Consent signed and in chart.   Thank you for this interesting consult.  I greatly enjoyed meeting Victoria Foley and look forward to participating in their care.  A copy of this report was sent to the requesting provider on this date.  Electronically Signed: Tyson Alias, NP 12/11/2021, 10:03 AM   I spent a total of 20 minutes in face to face in clinical consultation, greater than 50% of which was counseling/coordinating care for stage IV lung adenocarcinoma.

## 2021-12-11 ENCOUNTER — Encounter: Payer: Self-pay | Admitting: Radiology

## 2021-12-11 ENCOUNTER — Ambulatory Visit
Admission: RE | Admit: 2021-12-11 | Discharge: 2021-12-11 | Disposition: A | Discharge status: Home / Self Care | Payer: PPO | Source: Ambulatory Visit | Attending: Internal Medicine | Admitting: Internal Medicine

## 2021-12-11 ENCOUNTER — Other Ambulatory Visit: Payer: Self-pay

## 2021-12-11 ENCOUNTER — Ambulatory Visit
Admission: RE | Admit: 2021-12-11 | Discharge: 2021-12-11 | Disposition: A | Discharge status: Home / Self Care | Payer: PPO | Source: Ambulatory Visit | Attending: Radiation Oncology | Admitting: Radiation Oncology

## 2021-12-11 DIAGNOSIS — Z452 Encounter for adjustment and management of vascular access device: Secondary | ICD-10-CM | POA: Diagnosis not present

## 2021-12-11 DIAGNOSIS — C3490 Malignant neoplasm of unspecified part of unspecified bronchus or lung: Secondary | ICD-10-CM

## 2021-12-11 DIAGNOSIS — C7951 Secondary malignant neoplasm of bone: Secondary | ICD-10-CM | POA: Diagnosis not present

## 2021-12-11 DIAGNOSIS — C799 Secondary malignant neoplasm of unspecified site: Secondary | ICD-10-CM | POA: Diagnosis not present

## 2021-12-11 DIAGNOSIS — Z510 Encounter for antineoplastic radiation therapy: Secondary | ICD-10-CM | POA: Diagnosis not present

## 2021-12-11 DIAGNOSIS — C3491 Malignant neoplasm of unspecified part of right bronchus or lung: Secondary | ICD-10-CM | POA: Insufficient documentation

## 2021-12-11 DIAGNOSIS — C3412 Malignant neoplasm of upper lobe, left bronchus or lung: Secondary | ICD-10-CM | POA: Diagnosis not present

## 2021-12-11 HISTORY — PX: IR IMAGING GUIDED PORT INSERTION: IMG5740

## 2021-12-11 LAB — RAD ONC ARIA SESSION SUMMARY
Course Elapsed Days: 6
Plan Fractions Treated to Date: 5
Plan Prescribed Dose Per Fraction: 3 Gy
Plan Total Fractions Prescribed: 10
Plan Total Prescribed Dose: 30 Gy
Reference Point Dosage Given to Date: 15 Gy
Reference Point Session Dosage Given: 3 Gy
Session Number: 5

## 2021-12-11 MED ORDER — MIDAZOLAM HCL 2 MG/2ML IJ SOLN
INTRAMUSCULAR | Status: AC | PRN
  Administered 2021-12-11: 1 mg via INTRAVENOUS

## 2021-12-11 MED ORDER — LIDOCAINE-EPINEPHRINE 1 %-1:100000 IJ SOLN
INTRAMUSCULAR | Status: AC
  Administered 2021-12-11: 20 mL
  Filled 2021-12-11: qty 1

## 2021-12-11 MED ORDER — FENTANYL CITRATE (PF) 100 MCG/2ML IJ SOLN
INTRAMUSCULAR | Status: AC
  Filled 2021-12-11: qty 2

## 2021-12-11 MED ORDER — SODIUM CHLORIDE 0.9 % IV SOLN
INTRAVENOUS | Status: DC
  Filled 2021-12-11: qty 1000

## 2021-12-11 MED ORDER — FENTANYL CITRATE (PF) 100 MCG/2ML IJ SOLN
INTRAMUSCULAR | Status: AC | PRN
  Administered 2021-12-11: 25 ug via INTRAVENOUS
  Administered 2021-12-11: 50 ug via INTRAVENOUS

## 2021-12-11 MED ORDER — HEPARIN SOD (PORK) LOCK FLUSH 100 UNIT/ML IV SOLN
INTRAVENOUS | Status: AC
  Administered 2021-12-11: 500 [IU]
  Filled 2021-12-11: qty 5

## 2021-12-11 MED ORDER — LORAZEPAM 2 MG/ML IJ SOLN
INTRAMUSCULAR | Status: AC | PRN
  Administered 2021-12-11: .5 mg via INTRAVENOUS

## 2021-12-11 MED ORDER — MIDAZOLAM HCL 2 MG/2ML IJ SOLN
INTRAMUSCULAR | Status: AC
  Filled 2021-12-11: qty 4

## 2021-12-11 NOTE — Procedures (Signed)
Interventional Radiology Procedure Note  Procedure: Placement of a right IJ approach single lumen PowerPort.  Tip is positioned at the superior cavoatrial junction and catheter is ready for immediate use.  Complications: No immediate Recommendations:  - Ok to shower tomorrow - Do not submerge for 7 days - Routine line care   Signed,  Marzell Isakson K. Cadince Hilscher, MD   

## 2021-12-12 ENCOUNTER — Inpatient Hospital Stay: Payer: PPO

## 2021-12-12 ENCOUNTER — Other Ambulatory Visit: Payer: Self-pay

## 2021-12-12 ENCOUNTER — Ambulatory Visit
Admission: RE | Admit: 2021-12-12 | Discharge: 2021-12-12 | Disposition: A | Discharge status: Home / Self Care | Payer: PPO | Source: Ambulatory Visit | Attending: Radiation Oncology | Admitting: Radiation Oncology

## 2021-12-12 DIAGNOSIS — C3490 Malignant neoplasm of unspecified part of unspecified bronchus or lung: Secondary | ICD-10-CM

## 2021-12-12 DIAGNOSIS — Z510 Encounter for antineoplastic radiation therapy: Secondary | ICD-10-CM | POA: Diagnosis not present

## 2021-12-12 LAB — RAD ONC ARIA SESSION SUMMARY
Course Elapsed Days: 7
Plan Fractions Treated to Date: 6
Plan Prescribed Dose Per Fraction: 3 Gy
Plan Total Fractions Prescribed: 10
Plan Total Prescribed Dose: 30 Gy
Reference Point Dosage Given to Date: 18 Gy
Reference Point Session Dosage Given: 3 Gy
Session Number: 6

## 2021-12-12 LAB — CBC
HCT: 34.1 % — ABNORMAL LOW (ref 36.0–46.0)
Hemoglobin: 11.5 g/dL — ABNORMAL LOW (ref 12.0–15.0)
MCH: 32.2 pg (ref 26.0–34.0)
MCHC: 33.7 g/dL (ref 30.0–36.0)
MCV: 95.5 fL (ref 80.0–100.0)
Platelets: 210 10*3/uL (ref 150–400)
RBC: 3.57 MIL/uL — ABNORMAL LOW (ref 3.87–5.11)
RDW: 13.2 % (ref 11.5–15.5)
WBC: 4.3 10*3/uL (ref 4.0–10.5)
nRBC: 0 % (ref 0.0–0.2)

## 2021-12-12 MED ORDER — CYANOCOBALAMIN 1000 MCG/ML IJ SOLN
1000.0000 ug | Freq: Once | INTRAMUSCULAR | Status: AC
  Administered 2021-12-12: 1000 ug via INTRAMUSCULAR
  Filled 2021-12-12: qty 1

## 2021-12-13 ENCOUNTER — Other Ambulatory Visit: Payer: Self-pay

## 2021-12-13 ENCOUNTER — Ambulatory Visit
Admission: RE | Admit: 2021-12-13 | Discharge: 2021-12-13 | Disposition: A | Discharge status: Home / Self Care | Payer: PPO | Source: Ambulatory Visit | Attending: Radiation Oncology | Admitting: Radiation Oncology

## 2021-12-13 DIAGNOSIS — Z510 Encounter for antineoplastic radiation therapy: Secondary | ICD-10-CM | POA: Diagnosis not present

## 2021-12-13 LAB — RAD ONC ARIA SESSION SUMMARY
Course Elapsed Days: 8
Plan Fractions Treated to Date: 7
Plan Prescribed Dose Per Fraction: 3 Gy
Plan Total Fractions Prescribed: 10
Plan Total Prescribed Dose: 30 Gy
Reference Point Dosage Given to Date: 21 Gy
Reference Point Session Dosage Given: 3 Gy
Session Number: 7

## 2021-12-14 ENCOUNTER — Other Ambulatory Visit: Payer: Self-pay

## 2021-12-14 ENCOUNTER — Ambulatory Visit
Admission: RE | Admit: 2021-12-14 | Discharge: 2021-12-14 | Disposition: A | Discharge status: Home / Self Care | Payer: PPO | Source: Ambulatory Visit | Attending: Radiation Oncology | Admitting: Radiation Oncology

## 2021-12-14 DIAGNOSIS — Z510 Encounter for antineoplastic radiation therapy: Secondary | ICD-10-CM | POA: Diagnosis not present

## 2021-12-14 LAB — RAD ONC ARIA SESSION SUMMARY
Course Elapsed Days: 9
Plan Fractions Treated to Date: 8
Plan Prescribed Dose Per Fraction: 3 Gy
Plan Total Fractions Prescribed: 10
Plan Total Prescribed Dose: 30 Gy
Reference Point Dosage Given to Date: 24 Gy
Reference Point Session Dosage Given: 3 Gy
Session Number: 8

## 2021-12-15 ENCOUNTER — Other Ambulatory Visit: Payer: Self-pay

## 2021-12-17 ENCOUNTER — Ambulatory Visit
Admission: RE | Admit: 2021-12-17 | Discharge: 2021-12-17 | Disposition: A | Discharge status: Home / Self Care | Payer: PPO | Source: Ambulatory Visit | Attending: Radiation Oncology | Admitting: Radiation Oncology

## 2021-12-17 ENCOUNTER — Other Ambulatory Visit: Payer: Self-pay

## 2021-12-17 ENCOUNTER — Ambulatory Visit: Payer: PPO

## 2021-12-17 DIAGNOSIS — Z510 Encounter for antineoplastic radiation therapy: Secondary | ICD-10-CM | POA: Diagnosis not present

## 2021-12-17 LAB — RAD ONC ARIA SESSION SUMMARY
Course Elapsed Days: 12
Plan Fractions Treated to Date: 9
Plan Prescribed Dose Per Fraction: 3 Gy
Plan Total Fractions Prescribed: 10
Plan Total Prescribed Dose: 30 Gy
Reference Point Dosage Given to Date: 27 Gy
Reference Point Session Dosage Given: 3 Gy
Session Number: 9

## 2021-12-18 ENCOUNTER — Encounter: Payer: Self-pay | Admitting: *Deleted

## 2021-12-18 ENCOUNTER — Other Ambulatory Visit: Payer: PPO

## 2021-12-18 ENCOUNTER — Inpatient Hospital Stay: Payer: PPO

## 2021-12-18 ENCOUNTER — Encounter: Payer: Self-pay | Admitting: Internal Medicine

## 2021-12-18 ENCOUNTER — Ambulatory Visit
Admission: RE | Admit: 2021-12-18 | Discharge: 2021-12-18 | Disposition: A | Discharge status: Home / Self Care | Payer: PPO | Source: Ambulatory Visit | Attending: Radiation Oncology | Admitting: Radiation Oncology

## 2021-12-18 ENCOUNTER — Other Ambulatory Visit: Payer: Self-pay

## 2021-12-18 ENCOUNTER — Inpatient Hospital Stay (HOSPITAL_BASED_OUTPATIENT_CLINIC_OR_DEPARTMENT_OTHER): Payer: PPO | Admitting: Internal Medicine

## 2021-12-18 VITALS — BP 139/77 | HR 72 | Temp 97.8°F | Resp 18

## 2021-12-18 VITALS — BP 153/84 | HR 80 | Temp 98.8°F | Resp 18 | Wt 147.6 lb

## 2021-12-18 DIAGNOSIS — C7951 Secondary malignant neoplasm of bone: Secondary | ICD-10-CM

## 2021-12-18 DIAGNOSIS — C3490 Malignant neoplasm of unspecified part of unspecified bronchus or lung: Secondary | ICD-10-CM

## 2021-12-18 DIAGNOSIS — Z510 Encounter for antineoplastic radiation therapy: Secondary | ICD-10-CM | POA: Diagnosis not present

## 2021-12-18 DIAGNOSIS — C3412 Malignant neoplasm of upper lobe, left bronchus or lung: Secondary | ICD-10-CM | POA: Diagnosis not present

## 2021-12-18 DIAGNOSIS — M25551 Pain in right hip: Secondary | ICD-10-CM | POA: Diagnosis not present

## 2021-12-18 LAB — RAD ONC ARIA SESSION SUMMARY
Course Elapsed Days: 13
Plan Fractions Treated to Date: 10
Plan Prescribed Dose Per Fraction: 3 Gy
Plan Total Fractions Prescribed: 10
Plan Total Prescribed Dose: 30 Gy
Reference Point Dosage Given to Date: 30 Gy
Reference Point Session Dosage Given: 3 Gy
Session Number: 10

## 2021-12-18 LAB — COMPREHENSIVE METABOLIC PANEL
ALT: 24 U/L (ref 0–44)
AST: 27 U/L (ref 15–41)
Albumin: 3.8 g/dL (ref 3.5–5.0)
Alkaline Phosphatase: 146 U/L — ABNORMAL HIGH (ref 38–126)
Anion gap: 6 (ref 5–15)
BUN: 10 mg/dL (ref 8–23)
CO2: 23 mmol/L (ref 22–32)
Calcium: 9.1 mg/dL (ref 8.9–10.3)
Chloride: 109 mmol/L (ref 98–111)
Creatinine, Ser: 0.71 mg/dL (ref 0.44–1.00)
GFR, Estimated: >60 mL/min (ref >60)
Glucose, Bld: 121 mg/dL — ABNORMAL HIGH (ref 70–99)
Potassium: 3.6 mmol/L (ref 3.5–5.1)
Sodium: 138 mmol/L (ref 135–145)
Total Bilirubin: 0.8 mg/dL (ref 0.3–1.2)
Total Protein: 7 g/dL (ref 6.5–8.1)

## 2021-12-18 LAB — CBC WITH DIFFERENTIAL/PLATELET
Abs Immature Granulocytes: 0.02 10*3/uL (ref 0.00–0.07)
Basophils Absolute: 0 10*3/uL (ref 0.0–0.1)
Basophils Relative: 1 %
Eosinophils Absolute: 0.1 10*3/uL (ref 0.0–0.5)
Eosinophils Relative: 3 %
HCT: 34.4 % — ABNORMAL LOW (ref 36.0–46.0)
Hemoglobin: 11.7 g/dL — ABNORMAL LOW (ref 12.0–15.0)
Immature Granulocytes: 1 %
Lymphocytes Relative: 17 %
Lymphs Abs: 0.7 10*3/uL (ref 0.7–4.0)
MCH: 32.3 pg (ref 26.0–34.0)
MCHC: 34 g/dL (ref 30.0–36.0)
MCV: 95 fL (ref 80.0–100.0)
Monocytes Absolute: 0.4 10*3/uL (ref 0.1–1.0)
Monocytes Relative: 9 %
Neutro Abs: 2.9 10*3/uL (ref 1.7–7.7)
Neutrophils Relative %: 69 %
Platelets: 221 10*3/uL (ref 150–400)
RBC: 3.62 MIL/uL — ABNORMAL LOW (ref 3.87–5.11)
RDW: 13 % (ref 11.5–15.5)
WBC: 4.2 10*3/uL (ref 4.0–10.5)
nRBC: 0 % (ref 0.0–0.2)

## 2021-12-18 MED ORDER — SODIUM CHLORIDE 0.9 % IV SOLN
150.0000 mg | Freq: Once | INTRAVENOUS | Status: AC
  Administered 2021-12-18: 150 mg via INTRAVENOUS
  Filled 2021-12-18: qty 5

## 2021-12-18 MED ORDER — PALONOSETRON HCL INJECTION 0.25 MG/5ML
0.2500 mg | Freq: Once | INTRAVENOUS | Status: AC
  Administered 2021-12-18: 0.25 mg via INTRAVENOUS
  Filled 2021-12-18: qty 5

## 2021-12-18 MED ORDER — HEPARIN SOD (PORK) LOCK FLUSH 100 UNIT/ML IV SOLN
INTRAVENOUS | Status: AC
  Filled 2021-12-18: qty 5

## 2021-12-18 MED ORDER — DEXAMETHASONE 4 MG PO TABS
ORAL_TABLET | ORAL | 0 refills | Status: DC
  Filled 2021-12-18: qty 20, 10d supply, fill #0

## 2021-12-18 MED ORDER — SODIUM CHLORIDE 0.9 % IV SOLN
500.0000 mg/m2 | Freq: Once | INTRAVENOUS | Status: AC
  Administered 2021-12-18: 800 mg via INTRAVENOUS
  Filled 2021-12-18: qty 32

## 2021-12-18 MED ORDER — SODIUM CHLORIDE 0.9 % IV SOLN
200.0000 mg | Freq: Once | INTRAVENOUS | Status: AC
  Administered 2021-12-18: 200 mg via INTRAVENOUS
  Filled 2021-12-18: qty 8

## 2021-12-18 MED ORDER — SODIUM CHLORIDE 0.9 % IV SOLN
10.0000 mg | Freq: Once | INTRAVENOUS | Status: AC
  Administered 2021-12-18: 10 mg via INTRAVENOUS
  Filled 2021-12-18: qty 1

## 2021-12-18 MED ORDER — BENZONATATE 200 MG PO CAPS
200.0000 mg | ORAL_CAPSULE | Freq: Three times a day (TID) | ORAL | 0 refills | Status: DC | PRN
  Filled 2021-12-18: qty 30, 10d supply, fill #0

## 2021-12-18 MED ORDER — SODIUM CHLORIDE 0.9 % IV SOLN
Freq: Once | INTRAVENOUS | Status: AC
  Filled 2021-12-18: qty 250

## 2021-12-18 MED ORDER — SODIUM CHLORIDE 0.9 % IV SOLN
396.0000 mg | Freq: Once | INTRAVENOUS | Status: AC
  Administered 2021-12-18: 400 mg via INTRAVENOUS
  Filled 2021-12-18: qty 40

## 2021-12-18 MED ORDER — HEPARIN SOD (PORK) LOCK FLUSH 100 UNIT/ML IV SOLN
500.0000 [IU] | Freq: Once | INTRAVENOUS | Status: AC | PRN
  Administered 2021-12-18: 500 [IU]
  Filled 2021-12-18: qty 5

## 2021-12-18 NOTE — Progress Notes (Signed)
Met with patient during follow up visit with Dr. Darrall Dears prior to start chemotherapy today. No barriers or needs identified during visit. Informed pt that will find out what [facility] needs prior to her consult with them but will let her know before she leaves today. Informed that will be given appts prior to leaving today as well. Instructed to call with any questions or needs. Pt verbalized understanding.

## 2021-12-18 NOTE — Progress Notes (Signed)
Patient here today for follow up and treatment consideration regarding lung cancer. Patient reports hip pain has improved with radiation, still has some stiffness when she is weight bearing. Patient has consulted with [facility].

## 2021-12-18 NOTE — Patient Instructions (Signed)
Instrucciones al darle de alta: Discharge Instructions Gracias por elegir al [facility] de Cncer de [place] para brindarle atencin mdica de oncologa y Music therapist.  Si usted tiene cita de laboratorio con [employer] de [place], por favor entre por la puerta principal y regstrese en el escritorio central de informacin.   Use ropa cmoda y [place] para tener fcil acceso a las vas del Portacath (acceso venoso de Engineer, site duracin) o la lnea PICC (catter central colocado por va perifrica).   Nos esforzamos por ofrecerle tiempo de calidad con su proveedor. Es posible que tenga que volver a programar su cita si llega tarde (15 minutos o ms).  El llegar tarde le afecta a usted y a otros pacientes cuyas citas son posteriores a Merchandiser, retail.  Adems, si usted falta a tres o ms citas sin avisar a la oficina, puede ser retirado(a) de la clnica a discrecin del proveedor.      Para las solicitudes de renovacin de recetas, pida a su farmacia que se ponga en contacto con nuestra oficina y deje que transcurran 81 horas para que se complete el proceso de las renovaciones.    Hoy usted recibi los siguientes agentes de quimioterapia e/o inmunoterapia       Para ayudar a prevenir las nuseas y los vmitos despus de su tratamiento, le recomendamos que tome su medicamento para las nuseas segn las indicaciones.  LOS SNTOMAS QUE DEBEN COMUNICARSE INMEDIATAMENTE SE INDICAN A CONTINUACIN: *FIEBRE SUPERIOR A 100.4 F (38 C) O MS *ESCALOFROS O SUDORACIN *NUSEAS Y VMITOS QUE NO SE CONTROLAN CON EL MEDICAMENTO PARA LAS NUSEAS *DIFICULTAD INUSUAL PARA RESPIRAR  *MORETONES O HEMORRAGIAS NO HABITUALES *PROBLEMAS URINARIOS (dolor o ardor al Garment/textile technologist o frecuencia para Garment/textile technologist) *PROBLEMAS INTESTINALES (diarrea inusual, estreimiento, dolor cerca del ano) SENSIBILIDAD EN LA BOCA Y EN LA GARGANTA CON O SIN LA PRESENCIA DE LCERAS (dolor de garganta, llagas en la boca o dolor de muelas/dientes) ERUPCIN, HINCHAZN  O DOLORES INUSUALES FLUJO VAGINAL INUSUAL O PICAZN/RASQUIA    Los puntos marcados con un asterisco ( *) indican una posible emergencia y debe hacer un seguimiento tan pronto como le sea posible o vaya al Departamento de Emergencias si se le presenta algn problema.  Por favor, muestre la [place] DE ADVERTENCIA DE Windy Canny DE ADVERTENCIA DE Benay Spice al registrarse en [address] de Emergencias y a la enfermera de triaje.  Si tiene preguntas despus de su visita o necesita cancelar o volver a programar su cita, por favor pngase en contacto con [facility] [place] [phone]  y [place] instrucciones. Las horas de oficina son de 8:00 a.m. a 4:30 p.m. de lunes a viernes. Por favor, tenga en cuenta que los mensajes de voz que se dejan despus de las 4:00 p.m. posiblemente no se devolvern hasta el siguiente da de [place].  Cerramos los fines de semana y [employer]. En todo momento tiene acceso a una enfermera para preguntas urgentes. Por favor, llame al nmero principal de la clnica [phone] y [place] instrucciones.   Para cualquier pregunta que no sea de carcter urgente, tambin puede ponerse en contacto con su proveedor [employer] Ahora ofrecemos visitas electrnicas para cualquier persona mayor de 18 aos que solicite atencin mdica en lnea para los sntomas que no sean urgentes. Para ms detalles vaya a [email or web address].   Tambin puede bajar la aplicacin de MyChart! Vaya a la tienda de aplicaciones, busque "MyChart", abra la aplicacin, seleccione [place], e ingrese  con su nombre de usuario y la contrasea de Pharmacist, community.  Las mscaras son opcionales en los centros de Hotel manager. Si desea que su equipo de cuidados mdicos use una [employer] atienden, por favor hgaselo saber al personal. Bethann Berkshire una persona de apoyo que tenga por lo menos 16 aos para que le acompae a sus citas.

## 2021-12-18 NOTE — Patient Instructions (Signed)
Take Dexamethasone 4 mg twice a day tomorrow.   Starting next cycle, take 4 mg two times a day, a day prior to chemotherapy. Do not take on the day of chemotherapy. Then take for one day after chemotherapy.

## 2021-12-18 NOTE — Progress Notes (Signed)
[place] OFFICE PROGRESS NOTE  Patient Care Team: McLean-Scocuzza, Nino Glow, MD as PCP - General (Internal Medicine) Telford Nab, RN as Oncology Nurse Navigator    ASSESSMENT & PLAN:  # Primary lung adenocarcinoma [facility]), Stage IV, PDL1 1% -Diagnosed 11/19/2021 after transbronchial biopsy of LUL nodule by Dr. Patsey Berthold.  Metastatic to pelvis, bilateral hips R>L and numerous pulmonary nodules. -MRI brain showed no intracranial mets. 1 cm indeterminate left frontal calvarium lesion. Andrews 1 testing showed PD-L1 1%.  No other targetable mutations.  Report below  -We will proceed with carboplatin AUC 5, pemetrexed 500 and Keytruda 200 mg every 3-week for 4 cycles.  We will repeat the scans after 4 cycles.  If she has positive response, we will start on maintenance pemetrexed and Keytruda.  She is taking folic acid 1 mg daily.  Vitamin B12 injection every 9 weeks.  We will do Decadron 4 mg twice daily a day prior and a day after chemotherapy.  Side effects were again discussed with the patient.  Labs reviewed and okay to proceed. -Antiemetics Zofran and Compazine as needed -Sought second opinion with Dr. Varney Biles at [facility] who agreed with our treatment plan.  He has sent out lung fusion panel which we will follow-up.  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 Nondisplaced pathologic fracture of the quadrilateral plate of the right acetabulum.  -Completed 10 sessions of RT total 30 Gy with Dr. Donella Stade on 12/18/2021 -Could not tolerate tramadol due to itching.  She is using Tylenol currently and reports her pain is manageable. -She was referred to Ortho oncology by Dr. Varney Biles [facility] Oncology at [facility]  when seen as a second opinion.  We were able to obtain the orders for the necessary x-ray and patient was informed that she can get them done as walk-in.  #Normocytic anemia -Could be related to radiation therapy -We will monitor.  # Dry Cough  -Could be related to her allergies and also irritation from mass -She is using Flonase and Claritin which is not helping.   - Discussed about starting NyQuil and sent a prescription for Tessalon Perles.  If still bothersome, we can consider codeine syrup.   RTC in 1 week for MD visit and labs RTC in 3 weeks for MD visit, labs and cycle 2   Orders Placed This Encounter  Procedures   DG FEMUR, MIN 2 VIEWS RIGHT    Standing Status:   Future    Standing Expiration Date:   12/19/2022    Order Specific Question:   Reason for Exam (SYMPTOM  OR DIAGNOSIS REQUIRED)    Answer:   right hip pain, difficulty weight bearing, known metastatic disease    Order Specific Question:   Preferred imaging location?    Answer:   [facility]   DG HIP UNILAT W OR W/O PELVIS 2-3 VIEWS RIGHT    Standing Status:   Future    Standing Expiration Date:   12/19/2022    Order Specific Question:   Reason for Exam (SYMPTOM  OR DIAGNOSIS REQUIRED)    Answer:   right hip pain, difficulty with weight bearing, known metastatic disease    Order Specific Question:   Preferred imaging location?  Answer:   [facility]- 12/06/2021   All questions were answered. The patient knows to call the clinic with any problems, questions or concerns. The total time spent in the appointment was 30 minutes encounter with patients including review of chart and various tests results, discussions about plan of care and coordination of care plan   Jane Canary, MD 12/18/2021 10:16 AM  INTERVAL HISTORY: Please see below for problem oriented charting. she returns for treatment follow-up accompanied with daughter Lynelle Smoke.  She is taking Tylenol for her right hip pain  which is manageable.  She is unable to tolerate tramadol due to itching.  She reports stiffness in her right hip all along.  Completed her last dose of radiation today.  Her appetite has been poor.  She continues to have dry cough with postnasal drip not responding to Claritin and Flonase.  Patient denies fever, chills, nausea, vomiting, shortness of breath, abdominal pain, bleeding, bowel or bladder issues.   REVIEW OF SYSTEMS:   Positive ROS as above.  Rest 10 points ROS negative   I have reviewed the past medical history, past surgical history, social history and family history with the patient and they are unchanged from previous note.  ALLERGIES:  is allergic to diclofenac, lisinopril, and sulfa antibiotics.  MEDICATIONS:  Current Outpatient Medications  Medication Sig Dispense Refill   albuterol (VENTOLIN HFA) 108 (90 Base) MCG/ACT inhaler Inhale 2 puffs into the lungs every 6 (six) hours as needed for wheezing or shortness of breath. 18 g 0   benzonatate (TESSALON) 200 MG capsule Take 1 capsule (200 mg total) by mouth 3 (three) times daily as needed for cough. 30 capsule 0   cholecalciferol (VITAMIN D3) 25 MCG (1000 UNIT) tablet Take 1,000 Units by mouth daily.     dexamethasone (DECADRON) 4 MG tablet Starting next cycle, take 4 mg two times a day, a day prior to chemotherapy. Do not take on the day of chemotherapy. Then take for one day after chemotherapy. 20 tablet 0   fluticasone (FLONASE) 50 MCG/ACT nasal spray USE TWO SPRAYS IN EACH NOSTRIL ONCE DAILY prn 16 g 12   folic acid (FOLVITE) 1 MG tablet Take 1 tablet (1 mg total) by mouth daily. Start 7 days before pemetrexed chemotherapy. Continue until 21 days after pemetrexed completed. 100 tablet 3   lidocaine-prilocaine (EMLA) cream Apply to port site 30 mins prior using once 30 g 3   loratadine (CLARITIN) 10 MG tablet TAKE ONE TABLET BY MOUTH DAILY AS NEEDED FOR ALLERGIES 90 tablet 3   losartan (COZAAR) 100 MG tablet Take 1 tablet  (100 mg total) by mouth daily. 90 tablet 3   Multiple Vitamin (ONE-DAILY MULTI-VITAMIN PO) Take by mouth daily.     nebivolol (BYSTOLIC) 2.5 MG tablet Take 1 tablet (2.5 mg total) by mouth daily. 90 tablet 3   Chromium Picolinate (CHROMIUM PICOLATE PO) Take by mouth daily. (Patient not taking: Reported on 11/28/2021)     Omega-3 Fatty Acids (FISH OIL) 1000 MG CAPS Take by mouth. (Patient not taking: Reported on 11/28/2021)     ondansetron (ZOFRAN) 8 MG tablet Take 1 tablet (8 mg total) by mouth every 8 (eight) hours as needed for nausea or vomiting. Start on the third day after carboplatin. (Patient not taking: Reported on 12/18/2021) 30 tablet 1   prochlorperazine (COMPAZINE) 10 MG tablet Take 1 tablet (10 mg total) by mouth every 6 (six) hours as needed for nausea or vomiting. (Patient not taking:  Reported on 12/18/2021) 30 tablet 1   Turmeric (QC TUMERIC COMPLEX PO) Take 2 tablets by mouth daily. (Patient not taking: Reported on 11/28/2021)     No current facility-administered medications for this visit.   Facility-Administered Medications Ordered in Other Visits  Medication Dose Route Frequency Provider Last Rate Last Admin   CARBOplatin (PARAPLATIN) 400 mg in sodium chloride 0.9 % 250 mL chemo infusion  400 mg Intravenous Once Jane Canary, MD       dexamethasone (DECADRON) 10 mg in sodium chloride 0.9 % 50 mL IVPB  10 mg Intravenous Once Jane Canary, MD       fosaprepitant (EMEND) 150 mg in sodium chloride 0.9 % 145 mL IVPB  150 mg Intravenous Once Jane Canary, MD       heparin lock flush 100 UNIT/ML injection            heparin lock flush 100 unit/mL  500 Units Intracatheter Once PRN Jane Canary, MD       pembrolizumab [facility]) 200 mg in sodium chloride 0.9 % 50 mL chemo infusion  200 mg Intravenous Once Jane Canary, MD       PEMEtrexed (ALIMTA) 800 mg in sodium chloride 0.9 % 100 mL chemo infusion  500 mg/m2 (Treatment Plan Recorded) Intravenous Once Jane Canary, MD         SUMMARY OF ONCOLOGIC HISTORY: Oncology History  Primary lung adenocarcinoma ([place])  10/10/2021 Initial Diagnosis   Primary lung adenocarcinoma ([place]) Stage IV  Patient seen by PCP for wheezing for 2 weeks. Imaging with CXR followed by CT chest showed Spiculated 2.1 x 2.2 cm left upper lobe pulmonary nodule with associated pleural tethering. Innumerable subcentimeter pulmonary nodules throughout the lungs.      11/14/2021 PET scan   IMPRESSION: 1. Left suprahilar nodule with maximum SUV 4.5, and a more distal 2.2 cm left upper lobe nodule with maximum SUV of 5.5. 2. Innumerable scattered small pulmonary nodules, most of which are under 5 mm in diameter, throughout both lungs. Some are cavitary. Possibilities include cavitating hematogenous disseminated malignancy versus infectious etiology subset as septic emboli. Most of the nodules are stable, some are minimally enlarged compared to 10/10/2021. 3. Abnormal mild permeative type bony findings in the right hemipelvis associated with substantially accentuated metabolic activity. Possible associated pathologic fracture through the right quadrilateral plate and right acetabulum. The findings involve the right ilium and ischium but also the right lateral sacral ala and a small focus in the left sacrum.    Pathology Results   She had transbronchial biopsies by Dr. Patsey Berthold on 11/19/21.  Pathology from LUL nodule showed adenocarcinoma, consistent with lung primary.    11/19/2021 Cancer Staging   Staging form: Lung, AJCC 8th Edition - Clinical stage from 11/19/2021: Stage IVB (cT1c, cM1c) - Signed by Jane Canary, MD on 11/27/2021 Stage prefix: Initial diagnosis   11/27/2021 Imaging   MRI pelvis  IMPRESSION: 1. Abnormal marrow signal throughout the right ilium involving the right acetabulum, right superior pubic ramus and right inferior pubic ramus, right side of the sacrum and a smaller focus of abnormal marrow lesion in the left side  of the sacrum. Findings are concerning for metastatic disease. 2. Nondisplaced pathologic fracture of the quadrilateral plate of the right acetabulum. 3. Mild muscle edema in the right gluteus minimus and medius muscles likely reflecting mild muscle strain.  MRI brain  IMPRESSION: No evidence of intracranial metastatic disease.   Indeterminate 1 cm left frontal calvarium lesion.   12/18/2021 -  Chemotherapy   Patient is on Treatment Plan : LUNG Carboplatin (5) + Pemetrexed (500) + Pembrolizumab (200) D1 q21d Induction x 4 cycles / Maintenance Pemetrexed (500) + Pembrolizumab (200) D1 q21d       PHYSICAL EXAMINATION: ECOG PERFORMANCE STATUS: 2 - Symptomatic, <50% confined to bed  Vitals:   12/18/21 1003  BP: (!) 153/84  Pulse: 80  Resp: 18  Temp: 98.8 F (37.1 C)  SpO2: 99%   Filed Weights   12/18/21 1003  Weight: 147 lb 9.6 oz (67 kg)    Physical Exam Constitutional:      Appearance: Normal appearance.  HENT:     Head: Normocephalic and atraumatic.  Cardiovascular:     Rate and Rhythm: Normal rate.  Pulmonary:     Effort: Pulmonary effort is normal.  Musculoskeletal:     Right lower leg: No edema.     Left lower leg: No edema.  Skin:    General: Skin is warm.  Neurological:     General: No focal deficit present.     Mental Status: She is oriented to person, place, and time.  Psychiatric:        Mood and Affect: Mood normal.     LABORATORY DATA:  I have reviewed the data as listed    Component Value Date/Time   NA 138 12/18/2021 0837   K 3.6 12/18/2021 0837   CL 109 12/18/2021 0837   CO2 23 12/18/2021 0837   GLUCOSE 121 (H) 12/18/2021 0837   BUN 10 12/18/2021 0837   CREATININE 0.71 12/18/2021 0837   CALCIUM 9.1 12/18/2021 0837   PROT 7.0 12/18/2021 0837   ALBUMIN 3.8 12/18/2021 0837   AST 27 12/18/2021 0837   ALT 24 12/18/2021 0837   ALKPHOS 146 (H) 12/18/2021 0837   BILITOT 0.8 12/18/2021 0837   GFRNONAA >60 12/18/2021 0837    No results  found for: "SPEP", "UPEP"  Lab Results  Component Value Date   WBC 4.2 12/18/2021   NEUTROABS 2.9 12/18/2021   HGB 11.7 (L) 12/18/2021   HCT 34.4 (L) 12/18/2021   MCV 95.0 12/18/2021   PLT 221 12/18/2021      Chemistry      Component Value Date/Time   NA 138 12/18/2021 0837   K 3.6 12/18/2021 0837   CL 109 12/18/2021 0837   CO2 23 12/18/2021 0837   BUN 10 12/18/2021 0837   CREATININE 0.71 12/18/2021 0837      Component Value Date/Time   CALCIUM 9.1 12/18/2021 0837   ALKPHOS 146 (H) 12/18/2021 0837   AST 27 12/18/2021 0837   ALT 24 12/18/2021 0837   BILITOT 0.8 12/18/2021 0837       RADIOGRAPHIC STUDIES: I have personally reviewed the radiological images as listed and agreed with the findings in the report. IR IMAGING GUIDED PORT INSERTION  Result Date: 12/11/2021 INDICATION: Primary adenocarcinoma of the right lung with metastatic disease. Patient presents for port catheter placement for durable venous access. EXAM: IMPLANTED PORT A CATH PLACEMENT WITH ULTRASOUND AND FLUOROSCOPIC GUIDANCE MEDICATIONS: None. ANESTHESIA/SEDATION: Versed 1.5 mg IV; Fentanyl 75 mcg IV; Moderate Sedation Time:  20 minutes The patient was continuously monitored during the procedure by the interventional radiology nurse under my direct supervision. FLUOROSCOPY: Radiation exposure index: 6.7 mGy reference air kerma COMPLICATIONS: None immediate. PROCEDURE: The right neck and chest was prepped with chlorhexidine, and draped in the usual sterile fashion using maximum barrier technique (cap and mask, sterile gown, sterile gloves, large sterile sheet, hand hygiene  and cutaneous antiseptic). Local anesthesia was attained by infiltration with 1% lidocaine with epinephrine. Ultrasound demonstrated patency of the right internal jugular vein, and this was documented with an image. Under real-time ultrasound guidance, this vein was accessed with a 21 gauge micropuncture needle and image documentation was performed.  A small dermatotomy was made at the access site with an 11 scalpel. A 0.018" wire was advanced into the SVC and the access needle exchanged for a 109F micropuncture vascular sheath. The 0.018" wire was then removed and a 0.035" wire advanced into the IVC. An appropriate location for the subcutaneous reservoir was selected below the clavicle and an incision was made through the skin and underlying soft tissues. The subcutaneous tissues were then dissected using a combination of blunt and sharp surgical technique and a pocket was formed. A single lumen power injectable portacatheter was then tunneled through the subcutaneous tissues from the pocket to the dermatotomy and the port reservoir placed within the subcutaneous pocket. The venous access site was then serially dilated and a peel away vascular sheath placed over the wire. The wire was removed and the port catheter advanced into position under fluoroscopic guidance. The catheter tip is positioned in the superior cavoatrial junction. This was documented with a spot image. The portacatheter was then tested and found to flush and aspirate well. The port was flushed with saline followed by 100 units/mL heparinized saline. The pocket was then closed in two layers using first subdermal inverted interrupted absorbable sutures followed by a running subcuticular suture. The epidermis was then sealed with Dermabond. The dermatotomy at the venous access site was also closed with Dermabond. IMPRESSION: Successful placement of a right IJ approach Power Port with ultrasound and fluoroscopic guidance. The catheter is ready for use. Electronically Signed   By: Jacqulynn Cadet M.D.   On: 12/11/2021 16:27   MR PELVIS W WO CONTRAST  Result Date: 11/28/2021 CLINICAL DATA:  Pelvic mass. Patient presents for further characterization. EXAM: MRI PELVIS WITHOUT AND WITH CONTRAST TECHNIQUE: Multiplanar multisequence MR imaging of the pelvis was performed both before and after  administration of intravenous contrast. CONTRAST:  22m GADAVIST GADOBUTROL 1 MMOL/ML IV SOLN COMPARISON:  PET-CT 11/14/2021 FINDINGS: Bones: Prior left hip ORIF. No acute left hip fracture, dislocation or avascular necrosis. No right proximal femur fracture. Nondisplaced fracture of the quadrilateral plate of the right acetabulum through pathologic bone. Abnormal marrow signal throughout the right ilium involving the right acetabulum, right superior pubic ramus and right inferior pubic ramus. Abnormal marrow signal in the right side of the sacrum and a smaller focus of abnormal marrow lesion in the left side of the sacrum. Normal sacrum and sacroiliac joints. No SI joint widening or erosive changes. Grade 1 anterolisthesis of L4 on L5 secondary to moderate facet arthropathy. Mild broad-based disc bulge at L4-5. Articular cartilage and labrum Articular cartilage:  No chondral defect. Labrum: Grossly intact, but evaluation is limited by lack of intraarticular fluid. Joint or bursal effusion Joint effusion:  No hip joint effusion.  No SI joint effusion. Bursae:  Mild right iliopsoas bursitis. Muscles and tendons Flexors: Normal. Extensors: Normal. Abductors: Normal. Adductors: Normal. Gluteals: Mild muscle edema in the right gluteus minimus and medius muscles likely reflecting mild muscle strain. Hamstrings: Normal. Other findings No pelvic free fluid. No fluid collection or hematoma. No inguinal lymphadenopathy. No inguinal hernia. IMPRESSION: 1. Abnormal marrow signal throughout the right ilium involving the right acetabulum, right superior pubic ramus and right inferior pubic ramus, right side of  the sacrum and a smaller focus of abnormal marrow lesion in the left side of the sacrum. Findings are concerning for metastatic disease. 2. Nondisplaced pathologic fracture of the quadrilateral plate of the right acetabulum. 3. Mild muscle edema in the right gluteus minimus and medius muscles likely reflecting mild muscle  strain. Electronically Signed   By: Kathreen Devoid M.D.   On: 11/28/2021 09:46   MR Brain W Wo Contrast  Result Date: 11/28/2021 CLINICAL DATA:  Non-small cell lung cancer (NSCLC), staging Metastatic disease evaluation EXAM: MRI HEAD WITHOUT AND WITH CONTRAST TECHNIQUE: Multiplanar, multiecho pulse sequences of the brain and surrounding structures were obtained without and with intravenous contrast. CONTRAST:  34m GADAVIST GADOBUTROL 1 MMOL/ML IV SOLN COMPARISON:  None Available. FINDINGS: Brain: There is no acute infarction or intracranial hemorrhage. There is no hydrocephalus or extra-axial fluid collection. Ventricles and sulci are normal in size and configuration. Minimal patchy T2 hyperintensity in the supratentorial white matter is nonspecific but could reflect minor chronic microvascular ischemic changes. No abnormal parenchymal enhancement. Vascular: Major vessel flow voids at the skull base are preserved. Skull and upper cervical spine: 1 cm enhancing lesion of the left frontal calvarium (series 32, image 109). Normal marrow signal is otherwise preserved. Sinuses/Orbits: Paranasal sinuses are aerated. Orbits are unremarkable. Other: Sella is unremarkable.  Mastoid air cells are clear. IMPRESSION: No evidence of intracranial metastatic disease. Indeterminate 1 cm left frontal calvarium lesion. Electronically Signed   By: PMacy MisM.D.   On: 11/28/2021 09:17   DG Chest Port 1 View  Result Date: 11/19/2021 CLINICAL DATA:  Status post bronchoscopy EXAM: PORTABLE CHEST 1 VIEW COMPARISON:  Previous studies including CT chest done on 11/14/2021 FINDINGS: Cardiac size is within normal limits. There is homogeneous opacity in left suprahilar region. Diffuse nodularity is seen in both lungs. There is blunting of left lateral CP angle suggesting small effusion. There is no pneumothorax. IMPRESSION: There is homogeneous opacity in left suprahilar region suggesting pneumonia or underlying neoplasm. Diffuse  nodularity in both lungs suggests possible metastatic disease. Small left pleural effusion. Electronically Signed   By: PElmer PickerM.D.   On: 11/19/2021 15:08   DG C-Arm 1-60 Min-No Report  Result Date: 11/19/2021 Fluoroscopy was utilized by the requesting physician.  No radiographic interpretation.

## 2021-12-19 ENCOUNTER — Telehealth: Payer: Self-pay

## 2021-12-19 ENCOUNTER — Ambulatory Visit
Admission: RE | Admit: 2021-12-19 | Discharge: 2021-12-19 | Disposition: A | Discharge status: Home / Self Care | Payer: PPO | Attending: Internal Medicine | Admitting: Internal Medicine

## 2021-12-19 ENCOUNTER — Encounter: Payer: Self-pay | Admitting: Internal Medicine

## 2021-12-19 ENCOUNTER — Ambulatory Visit
Admission: RE | Admit: 2021-12-19 | Discharge: 2021-12-19 | Disposition: A | Discharge status: Home / Self Care | Payer: PPO | Source: Ambulatory Visit | Attending: Internal Medicine | Admitting: Internal Medicine

## 2021-12-19 DIAGNOSIS — C7951 Secondary malignant neoplasm of bone: Secondary | ICD-10-CM | POA: Insufficient documentation

## 2021-12-19 DIAGNOSIS — M25551 Pain in right hip: Secondary | ICD-10-CM | POA: Diagnosis not present

## 2021-12-19 DIAGNOSIS — M1611 Unilateral primary osteoarthritis, right hip: Secondary | ICD-10-CM | POA: Diagnosis not present

## 2021-12-19 NOTE — Telephone Encounter (Signed)
Telephone call to patient for follow up after receiving first infusion.   Patient daughter Lynelle Smoke states infusion went great.  States eating good and drinking plenty of fluids.   Denies any nausea or vomiting.  Encouraged patient to call for any concerns or questions.

## 2021-12-20 ENCOUNTER — Ambulatory Visit (INDEPENDENT_AMBULATORY_CARE_PROVIDER_SITE_OTHER): Payer: PPO

## 2021-12-20 VITALS — Ht 59.0 in | Wt 147.0 lb

## 2021-12-20 DIAGNOSIS — Z0000 Encounter for general adult medical examination without abnormal findings: Secondary | ICD-10-CM

## 2021-12-20 NOTE — Patient Instructions (Addendum)
Victoria Foley , Thank you for taking time to come for your Medicare Wellness Visit. I appreciate your ongoing commitment to your health goals. Please review the following plan we discussed and let me know if I can assist you in the future.   These are the goals we discussed:  Goals      Maintain Healthy Lifestyle     Healthy diet Stay active Strength training         This is a list of the screening recommended for you and due dates:  Health Maintenance  Topic Date Due   Tetanus Vaccine  Never done   Zoster (Shingles) Vaccine (1 of 2) 01/04/2022*   Pneumonia Vaccine (1 - PCV) 04/05/2022*   Mammogram  12/12/2022   Colon Cancer Screening  07/22/2023   DEXA scan (bone density measurement)  Completed   Hepatitis C Screening: USPSTF Recommendation to screen - Ages 82-79 yo.  Completed   HPV Vaccine  Aged Out   Flu Shot  Discontinued   COVID-19 Vaccine  Discontinued  *Topic was postponed. The date shown is not the original due date.    Preventive Care 38 Years and Older, Female Preventive care refers to lifestyle choices and visits with your health care provider that can promote health and wellness. What does preventive care include? A yearly physical exam. This is also called an annual well check. Dental exams once or twice a year. Routine eye exams. Ask your health care provider how often you should have your eyes checked. Personal lifestyle choices, including: Daily care of your teeth and gums. Regular physical activity. Eating a healthy diet. Avoiding tobacco and drug use. Limiting alcohol use. Practicing safe sex. Taking low-dose aspirin every day. Taking vitamin and mineral supplements as recommended by your health care provider. What happens during an annual well check? The services and screenings done by your health care provider during your annual well check will depend on your age, overall health, lifestyle risk factors, and family history of disease. Counseling   Your health care provider may ask you questions about your: Alcohol use. Tobacco use. Drug use. Emotional well-being. Home and relationship well-being. Sexual activity. Eating habits. History of falls. Memory and ability to understand (cognition). Work and work Statistician. Reproductive health. Screening  You may have the following tests or measurements: Height, weight, and BMI. Blood pressure. Lipid and cholesterol levels. These may be checked every 5 years, or more frequently if you are over 10 years old. Skin check. Lung cancer screening. You may have this screening every year starting at age 40 if you have a 30-pack-year history of smoking and currently smoke or have quit within the past 15 years. Fecal occult blood test (FOBT) of the stool. You may have this test every year starting at age 90. Flexible sigmoidoscopy or colonoscopy. You may have a sigmoidoscopy every 5 years or a colonoscopy every 10 years starting at age 39. Hepatitis C blood test. Hepatitis B blood test. Sexually transmitted disease (STD) testing. Diabetes screening. This is done by checking your blood sugar (glucose) after you have not eaten for a while (fasting). You may have this done every 1-3 years. Bone density scan. This is done to screen for osteoporosis. You may have this done starting at age 86. Mammogram. This may be done every 1-2 years. Talk to your health care provider about how often you should have regular mammograms. Talk with your health care provider about your test results, treatment options, and if necessary, the need for  more tests. Vaccines  Your health care provider may recommend certain vaccines, such as: Influenza vaccine. This is recommended every year. Tetanus, diphtheria, and acellular pertussis (Tdap, Td) vaccine. You may need a Td booster every 10 years. Zoster vaccine. You may need this after age 46. Pneumococcal 13-valent conjugate (PCV13) vaccine. One dose is recommended  after age 41. Pneumococcal polysaccharide (PPSV23) vaccine. One dose is recommended after age 53. Talk to your health care provider about which screenings and vaccines you need and how often you need them. This information is not intended to replace advice given to you by your health care provider. Make sure you discuss any questions you have with your health care provider. Document Released: 05/05/2015 Document Revised: 12/27/2015 Document Reviewed: 02/07/2015 Elsevier Interactive Patient Education  2017 [place] Prevention in the Home Falls can cause injuries. They can happen to people of all ages. There are many things you can do to make your home safe and to help prevent falls. What can I do on the outside of my home? Regularly fix the edges of walkways and driveways and fix any cracks. Remove anything that might make you trip as you walk through a door, such as a raised step or threshold. Trim any bushes or trees on the path to your home. Use bright outdoor lighting. Clear any walking paths of anything that might make someone trip, such as rocks or tools. Regularly check to see if handrails are loose or broken. Make sure that both sides of any steps have handrails. Any raised decks and porches should have guardrails on the edges. Have any leaves, snow, or ice cleared regularly. Use sand or salt on walking paths during winter. Clean up any spills in your garage right away. This includes oil or grease spills. What can I do in the bathroom? Use night lights. Install grab bars by the toilet and in the tub and shower. Do not use towel bars as grab bars. Use non-skid mats or decals in the tub or shower. If you need to sit down in the shower, use a plastic, non-slip stool. Keep the floor dry. Clean up any water that spills on the floor as soon as it happens. Remove soap buildup in the tub or shower regularly. Attach bath mats securely with double-sided non-slip rug tape. Do not  have throw rugs and other things on the floor that can make you trip. What can I do in the bedroom? Use night lights. Make sure that you have a light by your bed that is easy to reach. Do not use any sheets or blankets that are too big for your bed. They should not hang down onto the floor. Have a firm chair that has side arms. You can use this for support while you get dressed. Do not have throw rugs and other things on the floor that can make you trip. What can I do in the kitchen? Clean up any spills right away. Avoid walking on wet floors. Keep items that you use a lot in easy-to-reach places. If you need to reach something above you, use a strong step stool that has a grab bar. Keep electrical cords out of the way. Do not use floor polish or wax that makes floors slippery. If you must use wax, use non-skid floor wax. Do not have throw rugs and other things on the floor that can make you trip. What can I do with my stairs? Do not leave any items on the stairs. Make  sure that there are handrails on both sides of the stairs and use them. Fix handrails that are broken or loose. Make sure that handrails are as long as the stairways. Check any carpeting to make sure that it is firmly attached to the stairs. Fix any carpet that is loose or worn. Avoid having throw rugs at the top or bottom of the stairs. If you do have throw rugs, attach them to the floor with carpet tape. Make sure that you have a light switch at the top of the stairs and the bottom of the stairs. If you do not have them, ask someone to add them for you. What else can I do to help prevent falls? Wear shoes that: Do not have high heels. Have rubber bottoms. Are comfortable and fit you well. Are closed at the toe. Do not wear sandals. If you use a stepladder: Make sure that it is fully opened. Do not climb a closed stepladder. Make sure that both sides of the stepladder are locked into place. Ask someone to hold it for  you, if possible. Clearly mark and make sure that you can see: Any grab bars or handrails. First and last steps. Where the edge of each step is. Use tools that help you move around (mobility aids) if they are needed. These include: Canes. Walkers. Scooters. Crutches. Turn on the lights when you go into a dark area. Replace any light bulbs as soon as they burn out. Set up your furniture so you have a clear path. Avoid moving your furniture around. If any of your floors are uneven, fix them. If there are any pets around you, be aware of where they are. Review your medicines with your doctor. Some medicines can make you feel dizzy. This can increase your chance of falling. Ask your doctor what other things that you can do to help prevent falls. This information is not intended to replace advice given to you by your health care provider. Make sure you discuss any questions you have with your health care provider. Document Released: 02/02/2009 Document Revised: 09/14/2015 Document Reviewed: 05/13/2014 Elsevier Interactive Patient Education  2017 [employer].

## 2021-12-20 NOTE — Progress Notes (Signed)
Subjective:   Victoria Foley is a 71 y.o. female who presents for Medicare Annual (Subsequent) preventive examination.  Review of Systems    No ROS.  Medicare Wellness Virtual Visit.  Visual/audio telehealth visit, UTA vital signs.   See social history for additional risk factors.   Cardiac Risk Factors include: advanced age (>32men, >4 women)     Objective:    Today's Vitals   12/20/21 1136  Weight: 147 lb (66.7 kg)  Height: 4\' 11"  (1.499 m)   Body mass index is 29.69 kg/m.     12/20/2021   11:34 AM 11/28/2021    9:04 AM 11/19/2021   11:19 AM 11/09/2021    3:07 PM 12/19/2020   10:51 AM 10/22/2017   11:46 AM  Advanced Directives  Does Patient Have a Medical Advance Directive? Yes Yes Yes Yes No Yes  Type of Paramedic of [place];Living will Living will Living will   Living will  Does patient want to make changes to medical advance directive? No - Patient declined  No - Patient declined   No - Patient declined  Copy of [place] in Chart? No - copy requested       Would patient like information on creating a medical advance directive?     No - Patient declined     Current Medications (verified) Outpatient Encounter Medications as of 12/20/2021  Medication Sig   albuterol (VENTOLIN HFA) 108 (90 Base) MCG/ACT inhaler Inhale 2 puffs into the lungs every 6 (six) hours as needed for wheezing or shortness of breath.   benzonatate (TESSALON) 200 MG capsule Take 1 capsule (200 mg total) by mouth 3 (three) times daily as needed for cough.   cholecalciferol (VITAMIN D3) 25 MCG (1000 UNIT) tablet Take 1,000 Units by mouth daily.   Chromium Picolinate (CHROMIUM PICOLATE PO) Take by mouth daily. (Patient not taking: Reported on 11/28/2021)   dexamethasone (DECADRON) 4 MG tablet Starting next cycle, take 4 mg two times a day, a day prior to chemotherapy. Do not take on the day of chemotherapy. Then take for one day after chemotherapy.    fluticasone (FLONASE) 50 MCG/ACT nasal spray USE TWO SPRAYS IN EACH NOSTRIL ONCE DAILY prn   folic acid (FOLVITE) 1 MG tablet Take 1 tablet (1 mg total) by mouth daily. Start 7 days before pemetrexed chemotherapy. Continue until 21 days after pemetrexed completed.   lidocaine-prilocaine (EMLA) cream Apply to port site 30 mins prior using once   loratadine (CLARITIN) 10 MG tablet TAKE ONE TABLET BY MOUTH DAILY AS NEEDED FOR ALLERGIES   losartan (COZAAR) 100 MG tablet Take 1 tablet (100 mg total) by mouth daily.   Multiple Vitamin (ONE-DAILY MULTI-VITAMIN PO) Take by mouth daily.   nebivolol (BYSTOLIC) 2.5 MG tablet Take 1 tablet (2.5 mg total) by mouth daily.   Omega-3 Fatty Acids (FISH OIL) 1000 MG CAPS Take by mouth. (Patient not taking: Reported on 11/28/2021)   ondansetron (ZOFRAN) 8 MG tablet Take 1 tablet (8 mg total) by mouth every 8 (eight) hours as needed for nausea or vomiting. Start on the third day after carboplatin. (Patient not taking: Reported on 12/18/2021)   prochlorperazine (COMPAZINE) 10 MG tablet Take 1 tablet (10 mg total) by mouth every 6 (six) hours as needed for nausea or vomiting. (Patient not taking: Reported on 12/18/2021)   Turmeric (QC TUMERIC COMPLEX PO) Take 2 tablets by mouth daily. (Patient not taking: Reported on 11/28/2021)   Facility-Administered Encounter Medications  as of 12/20/2021  Medication   heparin lock flush 100 UNIT/ML injection    Allergies (verified) Diclofenac, Lisinopril, and Sulfa antibiotics   History: Past Medical History:  Diagnosis Date   Anemia    Anxiety    Arthritis    COVID-19    03/2021   Hypertension    Pneumonia    Pre-diabetes    Prediabetes    Sciatica    Past Surgical History:  Procedure Laterality Date   CESAREAN SECTION     COLONOSCOPY W/ POLYPECTOMY     ECTOPIC PREGNANCY SURGERY     FEMUR SURGERY     left, s/p rod for fracture   IR IMAGING GUIDED PORT INSERTION  12/11/2021   TUBAL LIGATION     Family History   Problem Relation Age of Onset   Diabetes Mother    Hypertension Mother    Heart disease Mother    Cancer Mother        breast and stomach   Breast cancer Mother 19   Diabetes Father    Hypertension Father    Heart disease Father    Kidney disease Father    Social History   Socioeconomic History   Marital status: Widowed    Spouse name: Not on file   Number of children: Not on file   Years of education: Not on file   Highest education level: Not on file  Occupational History   Not on file  Tobacco Use   Smoking status: Never   Smokeless tobacco: Never  Vaping Use   Vaping Use: Never used  Substance and Sexual Activity   Alcohol use: No   Drug use: Never   Sexual activity: Not Currently  Other Topics Concern   Not on file  Social History Narrative   Lives in [place] but husband died 2020/06/08 motorcycle accident bday 10/19/20 married 48 years   No pets   2 daughters and 3 granddaughters      Work - [employer] retired 09/2019   Diet - regular diet   Exercise - none   Social Determinants of Health   Financial Resource Strain: Low Risk  (12/20/2021)   Overall Financial Resource Strain (CARDIA)    Difficulty of Paying Living Expenses: Not hard at all  Food Insecurity: No Food Insecurity (12/20/2021)   Hunger Vital Sign    Worried About Running Out of Food in the Last Year: Never true    [place] in the Last Year: Never true  Transportation Needs: No Transportation Needs (12/20/2021)   PRAPARE - Hydrologist (Medical): No    Lack of Transportation (Non-Medical): No  Physical Activity: Not on file  Stress: No Stress Concern Present (12/20/2021)   [place]    Feeling of Stress : Not at all  Social Connections: Unknown (12/19/2020)   Social Connection and Isolation Panel [NHANES]    Frequency of Communication with Friends and Family: More than three times a week     Frequency of Social Gatherings with Friends and Family: Not on file    Attends Religious Services: Not on file    Active Member of Clubs or Organizations: Not on file    Attends Archivist Meetings: Not on file    Marital Status: Not on file    Tobacco Counseling Counseling given: Not Answered   Clinical Intake:  Pre-visit preparation completed: Yes  Diabetes: No  How often do you need to have someone help you when you read instructions, pamphlets, or other written materials from your doctor or pharmacy?: 1 - Never  Interpreter Needed?: No      Activities of Daily Living    12/20/2021   11:39 AM 12/11/2021    9:34 AM  In your present state of health, do you have any difficulty performing the following activities:  Hearing? 0 0  Vision? 0 0  Difficulty concentrating or making decisions? 0 0  Walking or climbing stairs? 1 0  Dressing or bathing? 0 0  Doing errands, shopping? 0   Preparing Food and eating ? N   Using the Toilet? N   In the past six months, have you accidently leaked urine? N   Do you have problems with loss of bowel control? N   Managing your Medications? N   Managing your Finances? N   Housekeeping or managing your Housekeeping? N     Patient Care Team: McLean-Scocuzza, Nino Glow, MD as PCP - General (Internal Medicine) Telford Nab, RN as Oncology Nurse Navigator  Indicate any recent Medical Services you may have received from other than [facility] providers in the past year (date may be approximate).     Assessment:   This is a routine wellness examination for Yeraldi.  Virtual Visit via Telephone Note  I connected with  MARGARITA CROKE on 12/20/21 at 11:30 AM EDT by telephone and verified that I am speaking with the correct person using two identifiers.  Location: Patient: home Provider: office Persons participating in the virtual visit: patient/Nurse Health Advisor   I discussed the limitations of performing an evaluation  and management service by telehealth. We continued and completed visit with audio only. Some vital signs may be absent or patient reported.   Hearing/Vision screen Hearing Screening - Comments:: Patient is able to hear conversational tones without difficulty.  No issues reported. Vision Screening - Comments:: Followed by Dr. Edison Pace Wears corrective/readers lenses They have seen their ophthalmologist in the last 12 months.    Dietary issues and exercise activities discussed: Current Exercise Habits: The patient does not participate in regular exercise at present   Goals Addressed             This Visit's Progress    Maintain Healthy Lifestyle       Healthy diet Stay active Strength training        Depression Screen    12/20/2021   11:38 AM 10/04/2021   10:52 AM 12/19/2020   10:49 AM 10/03/2020    9:41 AM 09/30/2019    9:41 AM 07/21/2019    1:40 PM 01/13/2018    8:22 AM  PHQ 2/9 Scores  PHQ - 2 Score 0 1 0 2 0 0 0  PHQ- 9 Score    8       Fall Risk    12/20/2021   12:55 PM 10/04/2021   10:52 AM 12/19/2020   10:51 AM 10/03/2020    8:58 AM 09/30/2019    9:41 AM  Fall Risk   Falls in the past year? 0 0 0 1 0  Number falls in past yr:  1  1 0  Injury with Fall?  0  1 0  Risk for fall due to : Impaired balance/gait History of fall(s)  History of fall(s)   Risk for fall due to: Comment Cane/walker in use      Follow up Falls evaluation completed Falls evaluation completed  Falls evaluation completed Falls evaluation completed Falls evaluation completed    FALL RISK PREVENTION PERTAINING TO THE HOME: Home free of loose throw rugs in walkways, pet beds, electrical cords, etc? Yes  Adequate lighting in your home to reduce risk of falls? Yes   ASSISTIVE DEVICES UTILIZED TO PREVENT FALLS:  Life alert? No  Use of a cane, walker or w/c? Yes  Grab bars in the bathroom? Yes  Shower chair or bench in shower? No  Elevated toilet seat or a handicapped toilet? No   TIMED UP AND  GO: Was the test performed? No .   Cognitive Function:        12/20/2021   12:56 PM  6CIT Screen  What Year? 0 points  What month? 0 points  What time? 0 points  Count back from 20 0 points  Months in reverse 0 points  Repeat phrase 0 points  Total Score 0 points    Immunizations Immunization History  Administered Date(s) Administered   Moderna Sars-Covid-2 Vaccination 06/14/2019, 07/13/2019   TDAP status: Due, Education has been provided regarding the importance of this vaccine. Advised may receive this vaccine at local pharmacy or Health Dept. Aware to provide a copy of the vaccination record if obtained from local pharmacy or Health Dept. Verbalized acceptance and understanding.  Screening Tests Health Maintenance  Topic Date Due   TETANUS/TDAP  Never done   Zoster Vaccines- Shingrix (1 of 2) 01/04/2022 (Originally 11/03/1969)   Pneumonia Vaccine 109+ Years old (1 - PCV) 04/05/2022 (Originally 11/02/2016)   MAMMOGRAM  12/12/2022   COLONOSCOPY (Pts 45-49yrs Insurance coverage will need to be confirmed)  07/22/2023   DEXA SCAN  Completed   Hepatitis C Screening  Completed   HPV VACCINES  Aged Out   INFLUENZA VACCINE  Discontinued   COVID-19 Vaccine  Discontinued   Health Maintenance Health Maintenance Due  Topic Date Due   TETANUS/TDAP  Never done   Lung Cancer Screening: (Low Dose CT Chest recommended if Age 45-80 years, 30 pack-year currently smoking OR have quit w/in 15years.) does not qualify.   Vision Screening: Recommended annual ophthalmology exams for early detection of glaucoma and other disorders of the eye.  Dental Screening: Recommended annual dental exams for proper oral hygiene  Community Resource Referral / Chronic Care Management: CRR required this visit?  No   CCM required this visit?  No      Plan:     I have personally reviewed and noted the following in the patient's chart:   Medical and social history Use of alcohol, tobacco or illicit  drugs  Current medications and supplements including opioid prescriptions. Patient is not currently taking opioid prescriptions. Functional ability and status Nutritional status Physical activity Advanced directives List of other physicians Hospitalizations, surgeries, and ER visits in previous 12 months Vitals Screenings to include cognitive, depression, and falls Referrals and appointments  In addition, I have reviewed and discussed with patient certain preventive protocols, quality metrics, and best practice recommendations. A written personalized care plan for preventive services as well as general preventive health recommendations were provided to patient.     Varney Biles, LPN   11/10/5748

## 2021-12-21 ENCOUNTER — Other Ambulatory Visit: Payer: Self-pay | Admitting: Oncology

## 2021-12-21 ENCOUNTER — Encounter: Payer: Self-pay | Admitting: *Deleted

## 2021-12-21 ENCOUNTER — Telehealth: Payer: Self-pay | Admitting: Oncology

## 2021-12-21 DIAGNOSIS — R300 Dysuria: Secondary | ICD-10-CM

## 2021-12-21 MED ORDER — OMEPRAZOLE 20 MG PO CPDR
20.0000 mg | DELAYED_RELEASE_CAPSULE | Freq: Every day | ORAL | 0 refills | Status: DC
Start: 2021-12-21 — End: 2022-06-25

## 2021-12-21 NOTE — Telephone Encounter (Signed)
Patient called to report indigestion and mild dysuria. No fever chills, no flank pain.  Recommend omeprazole, avoid NSAIDS. Rx sent Dysuria, recommend increase oral hydration, otc azo, check UA, urine culture. She can come to hospital lab and get checked . Patient appreciates advise and will call if additional symptoms

## 2021-12-21 NOTE — Patient Instructions (Signed)
Image reports for right hip and right femur xrays faxed to [facility] as requested by Richarda Blade, NP. [facility] will have to request images through [facility] (communicated via fax that images would need to be requested by [facility]). Fax confirmation received.

## 2021-12-22 ENCOUNTER — Other Ambulatory Visit
Admission: RE | Admit: 2021-12-22 | Discharge: 2021-12-22 | Disposition: A | Discharge status: Home / Self Care | Payer: PPO | Attending: Oncology | Admitting: Oncology

## 2021-12-22 DIAGNOSIS — R300 Dysuria: Secondary | ICD-10-CM | POA: Insufficient documentation

## 2021-12-22 DIAGNOSIS — C3490 Malignant neoplasm of unspecified part of unspecified bronchus or lung: Secondary | ICD-10-CM | POA: Insufficient documentation

## 2021-12-22 LAB — URINALYSIS, COMPLETE (UACMP) WITH MICROSCOPIC
Bacteria, UA: NONE SEEN
Bilirubin Urine: NEGATIVE
Glucose, UA: NEGATIVE mg/dL
Ketones, ur: NEGATIVE mg/dL
Leukocytes,Ua: NEGATIVE
Nitrite: NEGATIVE
Protein, ur: NEGATIVE mg/dL
Specific Gravity, Urine: 1.009 (ref 1.005–1.030)
pH: 5 (ref 5.0–8.0)

## 2021-12-22 LAB — CBC
HCT: 33.8 % — ABNORMAL LOW (ref 36.0–46.0)
Hemoglobin: 11.2 g/dL — ABNORMAL LOW (ref 12.0–15.0)
MCH: 31.1 pg (ref 26.0–34.0)
MCHC: 33.1 g/dL (ref 30.0–36.0)
MCV: 93.9 fL (ref 80.0–100.0)
Platelets: 223 10*3/uL (ref 150–400)
RBC: 3.6 MIL/uL — ABNORMAL LOW (ref 3.87–5.11)
RDW: 12.7 % (ref 11.5–15.5)
WBC: 3 10*3/uL — ABNORMAL LOW (ref 4.0–10.5)
nRBC: 0 % (ref 0.0–0.2)

## 2021-12-24 LAB — URINE CULTURE

## 2021-12-25 ENCOUNTER — Other Ambulatory Visit: Payer: Self-pay | Admitting: *Deleted

## 2021-12-25 DIAGNOSIS — C3490 Malignant neoplasm of unspecified part of unspecified bronchus or lung: Secondary | ICD-10-CM

## 2021-12-26 ENCOUNTER — Inpatient Hospital Stay: Payer: PPO

## 2021-12-26 ENCOUNTER — Other Ambulatory Visit: Payer: Self-pay | Admitting: *Deleted

## 2021-12-26 ENCOUNTER — Inpatient Hospital Stay: Payer: PPO | Admitting: Internal Medicine

## 2021-12-26 ENCOUNTER — Encounter: Payer: Self-pay | Admitting: *Deleted

## 2021-12-26 ENCOUNTER — Encounter: Payer: Self-pay | Admitting: Internal Medicine

## 2021-12-26 ENCOUNTER — Inpatient Hospital Stay: Payer: PPO | Attending: Internal Medicine

## 2021-12-26 VITALS — BP 130/80 | HR 96 | Temp 98.7°F

## 2021-12-26 DIAGNOSIS — M47816 Spondylosis without myelopathy or radiculopathy, lumbar region: Secondary | ICD-10-CM | POA: Insufficient documentation

## 2021-12-26 DIAGNOSIS — Z79899 Other long term (current) drug therapy: Secondary | ICD-10-CM | POA: Diagnosis not present

## 2021-12-26 DIAGNOSIS — C3490 Malignant neoplasm of unspecified part of unspecified bronchus or lung: Secondary | ICD-10-CM

## 2021-12-26 DIAGNOSIS — C7801 Secondary malignant neoplasm of right lung: Secondary | ICD-10-CM | POA: Diagnosis not present

## 2021-12-26 DIAGNOSIS — Z5111 Encounter for antineoplastic chemotherapy: Secondary | ICD-10-CM | POA: Diagnosis not present

## 2021-12-26 DIAGNOSIS — D649 Anemia, unspecified: Secondary | ICD-10-CM | POA: Insufficient documentation

## 2021-12-26 DIAGNOSIS — R059 Cough, unspecified: Secondary | ICD-10-CM | POA: Diagnosis not present

## 2021-12-26 DIAGNOSIS — C3412 Malignant neoplasm of upper lobe, left bronchus or lung: Secondary | ICD-10-CM | POA: Diagnosis not present

## 2021-12-26 DIAGNOSIS — C7951 Secondary malignant neoplasm of bone: Secondary | ICD-10-CM | POA: Insufficient documentation

## 2021-12-26 DIAGNOSIS — Z7952 Long term (current) use of systemic steroids: Secondary | ICD-10-CM | POA: Diagnosis not present

## 2021-12-26 LAB — COMPREHENSIVE METABOLIC PANEL
ALT: 23 U/L (ref 0–44)
AST: 25 U/L (ref 15–41)
Albumin: 3.8 g/dL (ref 3.5–5.0)
Alkaline Phosphatase: 121 U/L (ref 38–126)
Anion gap: 6 (ref 5–15)
BUN: 24 mg/dL — ABNORMAL HIGH (ref 8–23)
CO2: 23 mmol/L (ref 22–32)
Calcium: 9.3 mg/dL (ref 8.9–10.3)
Chloride: 106 mmol/L (ref 98–111)
Creatinine, Ser: 1.07 mg/dL — ABNORMAL HIGH (ref 0.44–1.00)
GFR, Estimated: 56 mL/min — ABNORMAL LOW (ref >60)
Glucose, Bld: 137 mg/dL — ABNORMAL HIGH (ref 70–99)
Potassium: 3.9 mmol/L (ref 3.5–5.1)
Sodium: 135 mmol/L (ref 135–145)
Total Bilirubin: 0.6 mg/dL (ref 0.3–1.2)
Total Protein: 7.1 g/dL (ref 6.5–8.1)

## 2021-12-26 LAB — CBC WITH DIFFERENTIAL/PLATELET
Abs Immature Granulocytes: 0.02 10*3/uL (ref 0.00–0.07)
Basophils Absolute: 0 10*3/uL (ref 0.0–0.1)
Basophils Relative: 0 %
Eosinophils Absolute: 0 10*3/uL (ref 0.0–0.5)
Eosinophils Relative: 0 %
HCT: 32 % — ABNORMAL LOW (ref 36.0–46.0)
Hemoglobin: 11 g/dL — ABNORMAL LOW (ref 12.0–15.0)
Immature Granulocytes: 1 %
Lymphocytes Relative: 17 %
Lymphs Abs: 0.5 10*3/uL — ABNORMAL LOW (ref 0.7–4.0)
MCH: 31.8 pg (ref 26.0–34.0)
MCHC: 34.4 g/dL (ref 30.0–36.0)
MCV: 92.5 fL (ref 80.0–100.0)
Monocytes Absolute: 0.2 10*3/uL (ref 0.1–1.0)
Monocytes Relative: 5 %
Neutro Abs: 2.3 10*3/uL (ref 1.7–7.7)
Neutrophils Relative %: 77 %
Platelets: 152 10*3/uL (ref 150–400)
RBC: 3.46 MIL/uL — ABNORMAL LOW (ref 3.87–5.11)
RDW: 11.8 % (ref 11.5–15.5)
WBC: 3 10*3/uL — ABNORMAL LOW (ref 4.0–10.5)
nRBC: 0 % (ref 0.0–0.2)

## 2021-12-26 MED ORDER — SODIUM CHLORIDE 0.9% FLUSH
10.0000 mL | Freq: Once | INTRAVENOUS | Status: AC
  Administered 2021-12-26: 10 mL via INTRAVENOUS
  Filled 2021-12-26: qty 10

## 2021-12-26 MED ORDER — HEPARIN SOD (PORK) LOCK FLUSH 100 UNIT/ML IV SOLN
500.0000 [IU] | Freq: Once | INTRAVENOUS | Status: AC
  Administered 2021-12-26: 500 [IU] via INTRAVENOUS
  Filled 2021-12-26: qty 5

## 2021-12-26 MED ORDER — SODIUM CHLORIDE 0.9 % IV SOLN
Freq: Once | INTRAVENOUS | Status: AC
  Filled 2021-12-26: qty 250

## 2021-12-26 NOTE — Progress Notes (Signed)
[place] OFFICE PROGRESS NOTE  Patient Care Team: Earlie Server, MD as PCP - General (Oncology) Telford Nab, RN as Oncology Nurse Navigator    ASSESSMENT & PLAN:  # Primary lung adenocarcinoma [facility]), Stage IV, PDL1 1% -Diagnosed 11/19/2021 after transbronchial biopsy of LUL nodule by Dr. Patsey Berthold.  Metastatic to pelvis, bilateral hips R>L and numerous pulmonary nodules. -MRI brain showed no intracranial mets. 1 cm indeterminate left frontal calvarium lesion. Hal[place] 1 testing showed PD-L1 1%.  No other targetable mutations.  Report below  -Received cycle 1 of carboplatin, Alimta and Keytruda 1 week ago.  Overall tolerated well.  Labs reviewed and nonactionable. -Her appetite is fair.  We will proceed with hydration today. -Sought second opinion with Dr. Varney Biles at [facility] who agreed with our treatment plan.  He has sent out lung fusion panel which we will follow-up.  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 Nondisplaced pathologic fracture of the quadrilateral plate of the right acetabulum.  -Completed 10 sessions of RT total 30 Gy with Dr. Donella Stade on 12/18/2021 -Could not tolerate tramadol due to itching.  She is using Tylenol currently and reports her pain is manageable. -She did x-rays of hip as recommended by orthopedics at [facility].  We sent records to them.  #Normocytic anemia -Likely related to chemotherapy -We will monitor.  # Dry Cough  -Improved -She is using Flonase and Claritin which is not helping.   -She is using [employer] as needed.  RTC in 2 weeks for MD visit, cycle 2 and labs    No orders of the defined types were placed in this encounter.  [facility]-  12/06/2021   All questions were answered. The patient knows to call the clinic with any problems, questions or concerns. The total time spent in the appointment was 30 minutes encounter with patients including review of chart and various tests results, discussions about plan of care and coordination of care plan   Jane Canary, MD 12/26/2021 11:38 AM  INTERVAL HISTORY: Please see below for problem oriented charting. she returns for treatment follow-up accompanied with daughter Lexine Baton.  Patient was seen today post cycle 1 of chemotherapy.  Overall she tolerated well.  Denies any nausea vomiting, fever, chills.  Her appetite is fair.  She tries to eat as much as she can.  She supplements with protein shakes.  Reports occasional dizziness when standing up which has been happening for few weeks now.  Denies any vision changes.  Denies shortness of breath, chest pain.  She called over the weekend for mild dysuria and indigestion.  Her UA was unremarkable.  Symptoms have resolved now.  REVIEW OF SYSTEMS:   Positive ROS as above.  Rest 10 points ROS negative   I have reviewed the past medical history, past surgical history, social history and family history with the patient and they are unchanged from previous note.  ALLERGIES:  is allergic to diclofenac, lisinopril, and sulfa antibiotics.  MEDICATIONS:  Current Outpatient Medications  Medication Sig Dispense Refill   albuterol (VENTOLIN HFA) 108 (90 Base) MCG/ACT inhaler Inhale 2 puffs into the lungs every 6 (six) hours as needed for wheezing or shortness of breath. 18 g 0   benzonatate (TESSALON) 200  MG capsule Take 1 capsule (200 mg total) by mouth 3 (three) times daily as needed for cough. 30 capsule 0   cholecalciferol (VITAMIN D3) 25 MCG (1000 UNIT) tablet Take 1,000 Units by mouth daily.     Chromium Picolinate (CHROMIUM PICOLATE PO) Take by mouth daily.     fluticasone (FLONASE) 50 MCG/ACT nasal spray USE TWO SPRAYS IN EACH NOSTRIL ONCE  DAILY prn 16 g 12   folic acid (FOLVITE) 1 MG tablet Take 1 tablet (1 mg total) by mouth daily. Start 7 days before pemetrexed chemotherapy. Continue until 21 days after pemetrexed completed. 100 tablet 3   lidocaine-prilocaine (EMLA) cream Apply to port site 30 mins prior using once 30 g 3   loratadine (CLARITIN) 10 MG tablet TAKE ONE TABLET BY MOUTH DAILY AS NEEDED FOR ALLERGIES 90 tablet 3   losartan (COZAAR) 100 MG tablet Take 1 tablet (100 mg total) by mouth daily. 90 tablet 3   Multiple Vitamin (ONE-DAILY MULTI-VITAMIN PO) Take by mouth daily.     nebivolol (BYSTOLIC) 2.5 MG tablet Take 1 tablet (2.5 mg total) by mouth daily. 90 tablet 3   omeprazole (PRILOSEC) 20 MG capsule Take 1 capsule (20 mg total) by mouth daily. 10 capsule 0   dexamethasone (DECADRON) 4 MG tablet Starting next cycle, take 4 mg two times a day, a day prior to chemotherapy. Do not take on the day of chemotherapy. Then take for one day after chemotherapy. (Patient not taking: Reported on 12/26/2021) 20 tablet 0   Omega-3 Fatty Acids (FISH OIL) 1000 MG CAPS Take by mouth. (Patient not taking: Reported on 11/28/2021)     ondansetron (ZOFRAN) 8 MG tablet Take 1 tablet (8 mg total) by mouth every 8 (eight) hours as needed for nausea or vomiting. Start on the third day after carboplatin. (Patient not taking: Reported on 12/18/2021) 30 tablet 1   prochlorperazine (COMPAZINE) 10 MG tablet Take 1 tablet (10 mg total) by mouth every 6 (six) hours as needed for nausea or vomiting. (Patient not taking: Reported on 12/18/2021) 30 tablet 1   Turmeric (QC TUMERIC COMPLEX PO) Take 2 tablets by mouth daily. (Patient not taking: Reported on 11/28/2021)     No current facility-administered medications for this visit.   Facility-Administered Medications Ordered in Other Visits  Medication Dose Route Frequency Provider Last Rate Last Admin   heparin lock flush 100 UNIT/ML injection             SUMMARY OF ONCOLOGIC HISTORY: Oncology History   Primary lung adenocarcinoma ([place])  10/10/2021 Initial Diagnosis   Primary lung adenocarcinoma ([place]) Stage IV  Patient seen by PCP for wheezing for 2 weeks. Imaging with CXR followed by CT chest showed Spiculated 2.1 x 2.2 cm left upper lobe pulmonary nodule with associated pleural tethering. Innumerable subcentimeter pulmonary nodules throughout the lungs.      11/14/2021 PET scan   IMPRESSION: 1. Left suprahilar nodule with maximum SUV 4.5, and a more distal 2.2 cm left upper lobe nodule with maximum SUV of 5.5. 2. Innumerable scattered small pulmonary nodules, most of which are under 5 mm in diameter, throughout both lungs. Some are cavitary. Possibilities include cavitating hematogenous disseminated malignancy versus infectious etiology subset as septic emboli. Most of the nodules are stable, some are minimally enlarged compared to 10/10/2021. 3. Abnormal mild permeative type bony findings in the right hemipelvis associated with substantially accentuated metabolic activity. Possible associated pathologic fracture through the right quadrilateral plate and right acetabulum. The findings involve the  right ilium and ischium but also the right lateral sacral ala and a small focus in the left sacrum.    Pathology Results   She had transbronchial biopsies by Dr. Patsey Berthold on 11/19/21.  Pathology from LUL nodule showed adenocarcinoma, consistent with lung primary.    11/19/2021 Cancer Staging   Staging form: Lung, AJCC 8th Edition - Clinical stage from 11/19/2021: Stage IVB (cT1c, cM1c) - Signed by Jane Canary, MD on 11/27/2021 Stage prefix: Initial diagnosis   11/27/2021 Imaging   MRI pelvis  IMPRESSION: 1. Abnormal marrow signal throughout the right ilium involving the right acetabulum, right superior pubic ramus and right inferior pubic ramus, right side of the sacrum and a smaller focus of abnormal marrow lesion in the left side of the sacrum. Findings are concerning for metastatic  disease. 2. Nondisplaced pathologic fracture of the quadrilateral plate of the right acetabulum. 3. Mild muscle edema in the right gluteus minimus and medius muscles likely reflecting mild muscle strain.  MRI brain  IMPRESSION: No evidence of intracranial metastatic disease.   Indeterminate 1 cm left frontal calvarium lesion.   12/18/2021 -  Chemotherapy   Patient is on Treatment Plan : LUNG Carboplatin (5) + Pemetrexed (500) + Pembrolizumab (200) D1 q21d Induction x 4 cycles / Maintenance Pemetrexed (500) + Pembrolizumab (200) D1 q21d       PHYSICAL EXAMINATION: ECOG PERFORMANCE STATUS: 2 - Symptomatic, <50% confined to bed  Vitals:   12/26/21 0850  BP: 130/80  Pulse: 96  Temp: 98.7 F (37.1 C)  SpO2: 100%    There were no vitals filed for this visit.   Physical Exam Constitutional:      Appearance: Normal appearance.  HENT:     Head: Normocephalic and atraumatic.  Cardiovascular:     Rate and Rhythm: Normal rate.  Pulmonary:     Effort: Pulmonary effort is normal.  Musculoskeletal:     Right lower leg: No edema.     Left lower leg: No edema.  Skin:    General: Skin is warm.  Neurological:     General: No focal deficit present.     Mental Status: She is oriented to person, place, and time.  Psychiatric:        Mood and Affect: Mood normal.     LABORATORY DATA:  I have reviewed the data as listed    Component Value Date/Time   NA 135 12/26/2021 0842   K 3.9 12/26/2021 0842   CL 106 12/26/2021 0842   CO2 23 12/26/2021 0842   GLUCOSE 137 (H) 12/26/2021 0842   BUN 24 (H) 12/26/2021 0842   CREATININE 1.07 (H) 12/26/2021 0842   CALCIUM 9.3 12/26/2021 0842   PROT 7.1 12/26/2021 0842   ALBUMIN 3.8 12/26/2021 0842   AST 25 12/26/2021 0842   ALT 23 12/26/2021 0842   ALKPHOS 121 12/26/2021 0842   BILITOT 0.6 12/26/2021 0842   GFRNONAA 56 (L) 12/26/2021 0842    No results found for: "SPEP", "UPEP"  Lab Results  Component Value Date   WBC 3.0 (L)  12/26/2021   NEUTROABS 2.3 12/26/2021   HGB 11.0 (L) 12/26/2021   HCT 32.0 (L) 12/26/2021   MCV 92.5 12/26/2021   PLT 152 12/26/2021      Chemistry      Component Value Date/Time   NA 135 12/26/2021 0842   K 3.9 12/26/2021 0842   CL 106 12/26/2021 0842   CO2 23 12/26/2021 0842   BUN 24 (H) 12/26/2021 3382  CREATININE 1.07 (H) 12/26/2021 0842      Component Value Date/Time   CALCIUM 9.3 12/26/2021 0842   ALKPHOS 121 12/26/2021 0842   AST 25 12/26/2021 0842   ALT 23 12/26/2021 0842   BILITOT 0.6 12/26/2021 0842       RADIOGRAPHIC STUDIES: I have personally reviewed the radiological images as listed and agreed with the findings in the report. DG FEMUR, MIN 2 VIEWS RIGHT  Result Date: 12/20/2021 CLINICAL DATA:  Right hip pain and difficulty weight-bearing Known metastatic disease EXAM: RIGHT FEMUR 2 VIEWS; DG HIP (WITH OR WITHOUT PELVIS) 2-3V RIGHT COMPARISON:  MRI pelvis 11/27/2021 FINDINGS: Right hip: Left femoral intramedullary nail partially visualized. The visualized portions are intact. Advanced degenerative changes of the lower lumbar spine are partially visualized. Irregular lucency and sclerosis of the right acetabulum, superior and inferior pubic rami is consistent with known nondisplaced fracture and metastatic involvement. Right femur: No fracture or dislocation. No osseous erosions, lytic, or blastic lesions. IMPRESSION: 1. Irregular lucency and sclerosis of the right acetabulum, superior and inferior pubic rami is consistent with known metastatic involvement and nondisplaced fracture. These findings are better visualized on recent MRI from 11/27/2021. 2. No acute abnormality of the right femur. No lytic or blastic lesions of the right femur. Electronically Signed   By: Miachel Roux M.D.   On: 12/20/2021 09:57   DG HIP UNILAT W OR W/O PELVIS 2-3 VIEWS RIGHT  Result Date: 12/20/2021 CLINICAL DATA:  Right hip pain and difficulty weight-bearing Known metastatic disease  EXAM: RIGHT FEMUR 2 VIEWS; DG HIP (WITH OR WITHOUT PELVIS) 2-3V RIGHT COMPARISON:  MRI pelvis 11/27/2021 FINDINGS: Right hip: Left femoral intramedullary nail partially visualized. The visualized portions are intact. Advanced degenerative changes of the lower lumbar spine are partially visualized. Irregular lucency and sclerosis of the right acetabulum, superior and inferior pubic rami is consistent with known nondisplaced fracture and metastatic involvement. Right femur: No fracture or dislocation. No osseous erosions, lytic, or blastic lesions. IMPRESSION: 1. Irregular lucency and sclerosis of the right acetabulum, superior and inferior pubic rami is consistent with known metastatic involvement and nondisplaced fracture. These findings are better visualized on recent MRI from 11/27/2021. 2. No acute abnormality of the right femur. No lytic or blastic lesions of the right femur. Electronically Signed   By: Miachel Roux M.D.   On: 12/20/2021 09:57   IR IMAGING GUIDED PORT INSERTION  Result Date: 12/11/2021 INDICATION: Primary adenocarcinoma of the right lung with metastatic disease. Patient presents for port catheter placement for durable venous access. EXAM: IMPLANTED PORT A CATH PLACEMENT WITH ULTRASOUND AND FLUOROSCOPIC GUIDANCE MEDICATIONS: None. ANESTHESIA/SEDATION: Versed 1.5 mg IV; Fentanyl 75 mcg IV; Moderate Sedation Time:  20 minutes The patient was continuously monitored during the procedure by the interventional radiology nurse under my direct supervision. FLUOROSCOPY: Radiation exposure index: 6.7 mGy reference air kerma COMPLICATIONS: None immediate. PROCEDURE: The right neck and chest was prepped with chlorhexidine, and draped in the usual sterile fashion using maximum barrier technique (cap and mask, sterile gown, sterile gloves, large sterile sheet, hand hygiene and cutaneous antiseptic). Local anesthesia was attained by infiltration with 1% lidocaine with epinephrine. Ultrasound demonstrated  patency of the right internal jugular vein, and this was documented with an image. Under real-time ultrasound guidance, this vein was accessed with a 21 gauge micropuncture needle and image documentation was performed. A small dermatotomy was made at the access site with an 11 scalpel. A 0.018" wire was advanced into the SVC and the access  needle exchanged for a 80F micropuncture vascular sheath. The 0.018" wire was then removed and a 0.035" wire advanced into the IVC. An appropriate location for the subcutaneous reservoir was selected below the clavicle and an incision was made through the skin and underlying soft tissues. The subcutaneous tissues were then dissected using a combination of blunt and sharp surgical technique and a pocket was formed. A single lumen power injectable portacatheter was then tunneled through the subcutaneous tissues from the pocket to the dermatotomy and the port reservoir placed within the subcutaneous pocket. The venous access site was then serially dilated and a peel away vascular sheath placed over the wire. The wire was removed and the port catheter advanced into position under fluoroscopic guidance. The catheter tip is positioned in the superior cavoatrial junction. This was documented with a spot image. The portacatheter was then tested and found to flush and aspirate well. The port was flushed with saline followed by 100 units/mL heparinized saline. The pocket was then closed in two layers using first subdermal inverted interrupted absorbable sutures followed by a running subcuticular suture. The epidermis was then sealed with Dermabond. The dermatotomy at the venous access site was also closed with Dermabond. IMPRESSION: Successful placement of a right IJ approach Power Port with ultrasound and fluoroscopic guidance. The catheter is ready for use. Electronically Signed   By: Jacqulynn Cadet M.D.   On: 12/11/2021 16:27   MR PELVIS W WO CONTRAST  Result Date:  11/28/2021 CLINICAL DATA:  Pelvic mass. Patient presents for further characterization. EXAM: MRI PELVIS WITHOUT AND WITH CONTRAST TECHNIQUE: Multiplanar multisequence MR imaging of the pelvis was performed both before and after administration of intravenous contrast. CONTRAST:  11m GADAVIST GADOBUTROL 1 MMOL/ML IV SOLN COMPARISON:  PET-CT 11/14/2021 FINDINGS: Bones: Prior left hip ORIF. No acute left hip fracture, dislocation or avascular necrosis. No right proximal femur fracture. Nondisplaced fracture of the quadrilateral plate of the right acetabulum through pathologic bone. Abnormal marrow signal throughout the right ilium involving the right acetabulum, right superior pubic ramus and right inferior pubic ramus. Abnormal marrow signal in the right side of the sacrum and a smaller focus of abnormal marrow lesion in the left side of the sacrum. Normal sacrum and sacroiliac joints. No SI joint widening or erosive changes. Grade 1 anterolisthesis of L4 on L5 secondary to moderate facet arthropathy. Mild broad-based disc bulge at L4-5. Articular cartilage and labrum Articular cartilage:  No chondral defect. Labrum: Grossly intact, but evaluation is limited by lack of intraarticular fluid. Joint or bursal effusion Joint effusion:  No hip joint effusion.  No SI joint effusion. Bursae:  Mild right iliopsoas bursitis. Muscles and tendons Flexors: Normal. Extensors: Normal. Abductors: Normal. Adductors: Normal. Gluteals: Mild muscle edema in the right gluteus minimus and medius muscles likely reflecting mild muscle strain. Hamstrings: Normal. Other findings No pelvic free fluid. No fluid collection or hematoma. No inguinal lymphadenopathy. No inguinal hernia. IMPRESSION: 1. Abnormal marrow signal throughout the right ilium involving the right acetabulum, right superior pubic ramus and right inferior pubic ramus, right side of the sacrum and a smaller focus of abnormal marrow lesion in the left side of the sacrum. Findings  are concerning for metastatic disease. 2. Nondisplaced pathologic fracture of the quadrilateral plate of the right acetabulum. 3. Mild muscle edema in the right gluteus minimus and medius muscles likely reflecting mild muscle strain. Electronically Signed   By: HKathreen DevoidM.D.   On: 11/28/2021 09:46   MR Brain W Wo Contrast  Result Date: 11/28/2021 CLINICAL DATA:  Non-small cell lung cancer (NSCLC), staging Metastatic disease evaluation EXAM: MRI HEAD WITHOUT AND WITH CONTRAST TECHNIQUE: Multiplanar, multiecho pulse sequences of the brain and surrounding structures were obtained without and with intravenous contrast. CONTRAST:  23m GADAVIST GADOBUTROL 1 MMOL/ML IV SOLN COMPARISON:  None Available. FINDINGS: Brain: There is no acute infarction or intracranial hemorrhage. There is no hydrocephalus or extra-axial fluid collection. Ventricles and sulci are normal in size and configuration. Minimal patchy T2 hyperintensity in the supratentorial white matter is nonspecific but could reflect minor chronic microvascular ischemic changes. No abnormal parenchymal enhancement. Vascular: Major vessel flow voids at the skull base are preserved. Skull and upper cervical spine: 1 cm enhancing lesion of the left frontal calvarium (series 32, image 109). Normal marrow signal is otherwise preserved. Sinuses/Orbits: Paranasal sinuses are aerated. Orbits are unremarkable. Other: Sella is unremarkable.  Mastoid air cells are clear. IMPRESSION: No evidence of intracranial metastatic disease. Indeterminate 1 cm left frontal calvarium lesion. Electronically Signed   By: PMacy MisM.D.   On: 11/28/2021 09:17

## 2021-12-26 NOTE — Progress Notes (Signed)
Patient here today for follow up regarding lung cancer, patient had first chemotherapy treatment last week.

## 2021-12-27 ENCOUNTER — Encounter: Payer: Self-pay | Admitting: Internal Medicine

## 2022-01-07 MED FILL — Fosaprepitant Dimeglumine For IV Infusion 150 MG (Base Eq): INTRAVENOUS | Qty: 5 | Status: AC

## 2022-01-07 MED FILL — Dexamethasone Sodium Phosphate Inj 100 MG/10ML: INTRAMUSCULAR | Qty: 1 | Status: AC

## 2022-01-08 ENCOUNTER — Inpatient Hospital Stay: Payer: PPO | Admitting: Internal Medicine

## 2022-01-08 ENCOUNTER — Encounter: Payer: Self-pay | Admitting: Internal Medicine

## 2022-01-08 ENCOUNTER — Inpatient Hospital Stay: Payer: PPO

## 2022-01-08 VITALS — BP 123/70 | HR 87 | Temp 98.0°F | Resp 18 | Wt 145.6 lb

## 2022-01-08 DIAGNOSIS — C3490 Malignant neoplasm of unspecified part of unspecified bronchus or lung: Secondary | ICD-10-CM

## 2022-01-08 DIAGNOSIS — D649 Anemia, unspecified: Secondary | ICD-10-CM

## 2022-01-08 DIAGNOSIS — Z5111 Encounter for antineoplastic chemotherapy: Secondary | ICD-10-CM | POA: Diagnosis not present

## 2022-01-08 LAB — CBC WITH DIFFERENTIAL/PLATELET
Abs Immature Granulocytes: 0.2 10*3/uL — ABNORMAL HIGH (ref 0.00–0.07)
Basophils Absolute: 0 10*3/uL (ref 0.0–0.1)
Basophils Relative: 0 %
Eosinophils Absolute: 0 10*3/uL (ref 0.0–0.5)
Eosinophils Relative: 0 %
HCT: 29 % — ABNORMAL LOW (ref 36.0–46.0)
Hemoglobin: 9.9 g/dL — ABNORMAL LOW (ref 12.0–15.0)
Immature Granulocytes: 4 %
Lymphocytes Relative: 19 %
Lymphs Abs: 1 10*3/uL (ref 0.7–4.0)
MCH: 31.6 pg (ref 26.0–34.0)
MCHC: 34.1 g/dL (ref 30.0–36.0)
MCV: 92.7 fL (ref 80.0–100.0)
Monocytes Absolute: 0.5 10*3/uL (ref 0.1–1.0)
Monocytes Relative: 9 %
Neutro Abs: 3.7 10*3/uL (ref 1.7–7.7)
Neutrophils Relative %: 68 %
Platelets: 281 10*3/uL (ref 150–400)
RBC: 3.13 MIL/uL — ABNORMAL LOW (ref 3.87–5.11)
RDW: 12.3 % (ref 11.5–15.5)
WBC: 5.4 10*3/uL (ref 4.0–10.5)
nRBC: 0 % (ref 0.0–0.2)

## 2022-01-08 LAB — COMPREHENSIVE METABOLIC PANEL
ALT: 33 U/L (ref 0–44)
AST: 32 U/L (ref 15–41)
Albumin: 3.7 g/dL (ref 3.5–5.0)
Alkaline Phosphatase: 114 U/L (ref 38–126)
Anion gap: 4 — ABNORMAL LOW (ref 5–15)
BUN: 26 mg/dL — ABNORMAL HIGH (ref 8–23)
CO2: 23 mmol/L (ref 22–32)
Calcium: 9.5 mg/dL (ref 8.9–10.3)
Chloride: 108 mmol/L (ref 98–111)
Creatinine, Ser: 0.82 mg/dL (ref 0.44–1.00)
GFR, Estimated: >60 mL/min (ref >60)
Glucose, Bld: 124 mg/dL — ABNORMAL HIGH (ref 70–99)
Potassium: 3.8 mmol/L (ref 3.5–5.1)
Sodium: 135 mmol/L (ref 135–145)
Total Bilirubin: 0.4 mg/dL (ref 0.3–1.2)
Total Protein: 7.3 g/dL (ref 6.5–8.1)

## 2022-01-08 MED ORDER — SODIUM CHLORIDE 0.9 % IV SOLN
Freq: Once | INTRAVENOUS | Status: AC
  Filled 2022-01-08: qty 250

## 2022-01-08 MED ORDER — SODIUM CHLORIDE 0.9 % IV SOLN
10.0000 mg | Freq: Once | INTRAVENOUS | Status: AC
  Administered 2022-01-08: 10 mg via INTRAVENOUS
  Filled 2022-01-08: qty 10

## 2022-01-08 MED ORDER — PALONOSETRON HCL INJECTION 0.25 MG/5ML
0.2500 mg | Freq: Once | INTRAVENOUS | Status: AC
  Administered 2022-01-08: 0.25 mg via INTRAVENOUS
  Filled 2022-01-08: qty 5

## 2022-01-08 MED ORDER — SODIUM CHLORIDE 0.9 % IV SOLN
200.0000 mg | Freq: Once | INTRAVENOUS | Status: AC
  Administered 2022-01-08: 200 mg via INTRAVENOUS
  Filled 2022-01-08: qty 8

## 2022-01-08 MED ORDER — SODIUM CHLORIDE 0.9 % IV SOLN
150.0000 mg | Freq: Once | INTRAVENOUS | Status: AC
  Administered 2022-01-08: 150 mg via INTRAVENOUS
  Filled 2022-01-08: qty 150

## 2022-01-08 MED ORDER — SODIUM CHLORIDE 0.9 % IV SOLN
500.0000 mg/m2 | Freq: Once | INTRAVENOUS | Status: AC
  Administered 2022-01-08: 800 mg via INTRAVENOUS
  Filled 2022-01-08: qty 12

## 2022-01-08 MED ORDER — SODIUM CHLORIDE 0.9 % IV SOLN
396.0000 mg | Freq: Once | INTRAVENOUS | Status: AC
  Administered 2022-01-08: 400 mg via INTRAVENOUS
  Filled 2022-01-08: qty 40

## 2022-01-08 MED ORDER — HEPARIN SOD (PORK) LOCK FLUSH 100 UNIT/ML IV SOLN
500.0000 [IU] | Freq: Once | INTRAVENOUS | Status: AC | PRN
  Administered 2022-01-08: 500 [IU]
  Filled 2022-01-08: qty 5

## 2022-01-08 NOTE — Progress Notes (Signed)
Patient here today for follow up regarding lung cancer. Patient denies concerns today.

## 2022-01-08 NOTE — Progress Notes (Signed)
[place] OFFICE PROGRESS NOTE  Patient Care Team: Earlie Server, MD as PCP - General (Oncology) Telford Nab, RN as Oncology Nurse Navigator  TREATMENT:  Right hip palliative RT 30 cGy completed 12/18/2021 Carboplatin, alimta and Keytruda started 12/18/2021  ASSESSMENT & PLAN:  # Primary lung adenocarcinoma ([place]), Stage IV, PDL1 1% -Diagnosed 11/19/2021 after transbronchial biopsy of LUL nodule by Dr. Patsey Berthold.  Metastatic to pelvis, bilateral hips R>L and numerous pulmonary nodules. -MRI brain showed no intracranial mets. 1 cm indeterminate left frontal calvarium lesion. [place] 1 testing showed PD-L1 1%.  No other targetable mutations.  Report below  -Labs reviewed and acceptable.  Proceed with cycle 2 of carboplatin, Alimta and Keytruda.  -Sought second opinion with Dr. Varney Biles at [facility] who agreed with our treatment plan.  He has sent out lung fusion panel which we will follow-up.  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 Nondisplaced pathologic fracture of the quadrilateral plate of the right acetabulum.  -Completed 10 sessions of RT total 30 Gy with Dr. Donella Stade on 12/18/2021 -Her pain is significantly improved. -Was seen by [facility].  No surgical intervention needed.  #Normocytic anemia -Likely related to chemotherapy -Hemoglobin trending down.  Today 9.9.  We will do iron studies next time.  # Dry Cough  -Improved -She is using Flonase and Claritin which is not helping.   -She is using [employer] as needed.  RTC in 3 weeks for MD visit, cycle 3 carbo Alimta Keytruda and labs.   Orders Placed This Encounter  Procedures   Iron and TIBC([facility])    Standing Status:    Future    Standing Expiration Date:   01/09/2023   Ferritin    Standing Status:   Future    Standing Expiration Date:   01/09/2023   CBC with Differential    Standing Status:   Future    Standing Expiration Date:   01/30/2023   Comprehensive metabolic panel    Standing Status:   Future    Standing Expiration Date:   01/30/2023   [facility]- 12/06/2021   All questions were answered. The patient knows to call the clinic with any problems, questions or concerns. The total time spent in the appointment was 30 minutes encounter with patients including review of chart and various tests results, discussions about plan of care and coordination of care plan   Jane Canary, MD 01/08/2022 9:28 AM  INTERVAL HISTORY: Please see below for problem oriented charting. she returns for treatment follow-up accompanied with daughter Lexine Baton.  Here for cycle 2 of chemo.  She is tolerating well. Patient denies fever, chills, nausea, vomiting, shortness of breath, cough, abdominal pain, bleeding, bowel or bladder issues. Appetite and energy levels are fair. Denies pain. She did not think she felt any different after hydration last time.  REVIEW OF SYSTEMS:   Positive ROS as above.  Rest 10 points ROS negative   I have reviewed the past medical history, past surgical history, social history and family history with the patient and they are unchanged from previous note.  ALLERGIES:  is allergic to diclofenac, lisinopril, and sulfa antibiotics.  MEDICATIONS:  Current Outpatient Medications  Medication Sig Dispense Refill   albuterol (VENTOLIN HFA)  108 (90 Base) MCG/ACT inhaler Inhale 2 puffs into the lungs every 6 (six) hours as needed for wheezing or shortness of breath. 18 g 0   benzonatate (TESSALON) 200 MG capsule Take 1 capsule (200 mg total) by mouth 3 (three) times daily as needed for cough. 30 capsule 0   cholecalciferol (VITAMIN D3) 25 MCG (1000 UNIT) tablet Take 1,000 Units by mouth daily.      Chromium Picolinate (CHROMIUM PICOLATE PO) Take by mouth daily.     dexamethasone (DECADRON) 4 MG tablet Starting next cycle, take 4 mg two times a day, a day prior to chemotherapy. Do not take on the day of chemotherapy. Then take for one day after chemotherapy. 20 tablet 0   fluticasone (FLONASE) 50 MCG/ACT nasal spray USE TWO SPRAYS IN EACH NOSTRIL ONCE DAILY prn 16 g 12   folic acid (FOLVITE) 1 MG tablet Take 1 tablet (1 mg total) by mouth daily. Start 7 days before pemetrexed chemotherapy. Continue until 21 days after pemetrexed completed. 100 tablet 3   lidocaine-prilocaine (EMLA) cream Apply to port site 30 mins prior using once 30 g 3   loratadine (CLARITIN) 10 MG tablet TAKE ONE TABLET BY MOUTH DAILY AS NEEDED FOR ALLERGIES 90 tablet 3   losartan (COZAAR) 100 MG tablet Take 1 tablet (100 mg total) by mouth daily. 90 tablet 3   Multiple Vitamin (ONE-DAILY MULTI-VITAMIN PO) Take by mouth daily.     nebivolol (BYSTOLIC) 2.5 MG tablet Take 1 tablet (2.5 mg total) by mouth daily. 90 tablet 3   Omega-3 Fatty Acids (FISH OIL) 1000 MG CAPS Take by mouth.     omeprazole (PRILOSEC) 20 MG capsule Take 1 capsule (20 mg total) by mouth daily. 10 capsule 0   ondansetron (ZOFRAN) 8 MG tablet Take 1 tablet (8 mg total) by mouth every 8 (eight) hours as needed for nausea or vomiting. Start on the third day after carboplatin. (Patient not taking: Reported on 12/18/2021) 30 tablet 1   prochlorperazine (COMPAZINE) 10 MG tablet Take 1 tablet (10 mg total) by mouth every 6 (six) hours as needed for nausea or vomiting. (Patient not taking: Reported on 12/18/2021) 30 tablet 1   Turmeric (QC TUMERIC COMPLEX PO) Take 2 tablets by mouth daily. (Patient not taking: Reported on 11/28/2021)     No current facility-administered medications for this visit.   Facility-Administered Medications Ordered in Other Visits  Medication Dose Route Frequency Provider Last Rate Last Admin   heparin lock flush 100 UNIT/ML injection              SUMMARY OF ONCOLOGIC HISTORY: Oncology History  Primary lung adenocarcinoma ([place])  10/10/2021 Initial Diagnosis   Primary lung adenocarcinoma ([place]) Stage IV  Patient seen by PCP for wheezing for 2 weeks. Imaging with CXR followed by CT chest showed Spiculated 2.1 x 2.2 cm left upper lobe pulmonary nodule with associated pleural tethering. Innumerable subcentimeter pulmonary nodules throughout the lungs.      11/14/2021 PET scan   IMPRESSION: 1. Left suprahilar nodule with maximum SUV 4.5, and a more distal 2.2 cm left upper lobe nodule with maximum SUV of 5.5. 2. Innumerable scattered small pulmonary nodules, most of which are under 5 mm in diameter, throughout both lungs. Some are cavitary. Possibilities include cavitating hematogenous disseminated malignancy versus infectious etiology subset as septic emboli. Most of the nodules are stable, some are minimally enlarged compared to 10/10/2021. 3. Abnormal mild permeative type bony findings in the right hemipelvis associated with substantially  accentuated metabolic activity. Possible associated pathologic fracture through the right quadrilateral plate and right acetabulum. The findings involve the right ilium and ischium but also the right lateral sacral ala and a small focus in the left sacrum.    Pathology Results   She had transbronchial biopsies by Dr. Patsey Berthold on 11/19/21.  Pathology from LUL nodule showed adenocarcinoma, consistent with lung primary.    11/19/2021 Cancer Staging   Staging form: Lung, AJCC 8th Edition - Clinical stage from 11/19/2021: Stage IVB (cT1c, cM1c) - Signed by Jane Canary, MD on 11/27/2021 Stage prefix: Initial diagnosis   11/27/2021 Imaging   MRI pelvis  IMPRESSION: 1. Abnormal marrow signal throughout the right ilium involving the right acetabulum, right superior pubic ramus and right inferior pubic ramus, right side of the sacrum and a smaller focus of abnormal marrow lesion in the  left side of the sacrum. Findings are concerning for metastatic disease. 2. Nondisplaced pathologic fracture of the quadrilateral plate of the right acetabulum. 3. Mild muscle edema in the right gluteus minimus and medius muscles likely reflecting mild muscle strain.  MRI brain  IMPRESSION: No evidence of intracranial metastatic disease.   Indeterminate 1 cm left frontal calvarium lesion.   12/18/2021 -  Chemotherapy   Patient is on Treatment Plan : LUNG Carboplatin (5) + Pemetrexed (500) + Pembrolizumab (200) D1 q21d Induction x 4 cycles / Maintenance Pemetrexed (500) + Pembrolizumab (200) D1 q21d       PHYSICAL EXAMINATION: ECOG PERFORMANCE STATUS: 2 - Symptomatic, <50% confined to bed  Vitals:   01/08/22 0910  BP: 123/70  Pulse: 87  Resp: 18  Temp: 98 F (36.7 C)  SpO2: 100%    Filed Weights   01/08/22 0910  Weight: 145 lb 9.6 oz (66 kg)     Physical Exam Constitutional:      Appearance: Normal appearance.  HENT:     Head: Normocephalic and atraumatic.  Cardiovascular:     Rate and Rhythm: Normal rate.  Pulmonary:     Effort: Pulmonary effort is normal.  Musculoskeletal:     Right lower leg: No edema.     Left lower leg: No edema.  Skin:    General: Skin is warm.  Neurological:     General: No focal deficit present.     Mental Status: She is oriented to person, place, and time.  Psychiatric:        Mood and Affect: Mood normal.     LABORATORY DATA:  I have reviewed the data as listed    Component Value Date/Time   NA 135 01/08/2022 0846   K 3.8 01/08/2022 0846   CL 108 01/08/2022 0846   CO2 23 01/08/2022 0846   GLUCOSE 124 (H) 01/08/2022 0846   BUN 26 (H) 01/08/2022 0846   CREATININE 0.82 01/08/2022 0846   CALCIUM 9.5 01/08/2022 0846   PROT 7.3 01/08/2022 0846   ALBUMIN 3.7 01/08/2022 0846   AST 32 01/08/2022 0846   ALT 33 01/08/2022 0846   ALKPHOS 114 01/08/2022 0846   BILITOT 0.4 01/08/2022 0846   GFRNONAA >60 01/08/2022 0846    No  results found for: "SPEP", "UPEP"  Lab Results  Component Value Date   WBC 5.4 01/08/2022   NEUTROABS 3.7 01/08/2022   HGB 9.9 (L) 01/08/2022   HCT 29.0 (L) 01/08/2022   MCV 92.7 01/08/2022   PLT 281 01/08/2022      Chemistry      Component Value Date/Time   NA 135 01/08/2022 0846  K 3.8 01/08/2022 0846   CL 108 01/08/2022 0846   CO2 23 01/08/2022 0846   BUN 26 (H) 01/08/2022 0846   CREATININE 0.82 01/08/2022 0846      Component Value Date/Time   CALCIUM 9.5 01/08/2022 0846   ALKPHOS 114 01/08/2022 0846   AST 32 01/08/2022 0846   ALT 33 01/08/2022 0846   BILITOT 0.4 01/08/2022 0846       RADIOGRAPHIC STUDIES: I have personally reviewed the radiological images as listed and agreed with the findings in the report. DG FEMUR, MIN 2 VIEWS RIGHT  Result Date: 12/20/2021 CLINICAL DATA:  Right hip pain and difficulty weight-bearing Known metastatic disease EXAM: RIGHT FEMUR 2 VIEWS; DG HIP (WITH OR WITHOUT PELVIS) 2-3V RIGHT COMPARISON:  MRI pelvis 11/27/2021 FINDINGS: Right hip: Left femoral intramedullary nail partially visualized. The visualized portions are intact. Advanced degenerative changes of the lower lumbar spine are partially visualized. Irregular lucency and sclerosis of the right acetabulum, superior and inferior pubic rami is consistent with known nondisplaced fracture and metastatic involvement. Right femur: No fracture or dislocation. No osseous erosions, lytic, or blastic lesions. IMPRESSION: 1. Irregular lucency and sclerosis of the right acetabulum, superior and inferior pubic rami is consistent with known metastatic involvement and nondisplaced fracture. These findings are better visualized on recent MRI from 11/27/2021. 2. No acute abnormality of the right femur. No lytic or blastic lesions of the right femur. Electronically Signed   By: Miachel Roux M.D.   On: 12/20/2021 09:57   DG HIP UNILAT W OR W/O PELVIS 2-3 VIEWS RIGHT  Result Date: 12/20/2021 CLINICAL  DATA:  Right hip pain and difficulty weight-bearing Known metastatic disease EXAM: RIGHT FEMUR 2 VIEWS; DG HIP (WITH OR WITHOUT PELVIS) 2-3V RIGHT COMPARISON:  MRI pelvis 11/27/2021 FINDINGS: Right hip: Left femoral intramedullary nail partially visualized. The visualized portions are intact. Advanced degenerative changes of the lower lumbar spine are partially visualized. Irregular lucency and sclerosis of the right acetabulum, superior and inferior pubic rami is consistent with known nondisplaced fracture and metastatic involvement. Right femur: No fracture or dislocation. No osseous erosions, lytic, or blastic lesions. IMPRESSION: 1. Irregular lucency and sclerosis of the right acetabulum, superior and inferior pubic rami is consistent with known metastatic involvement and nondisplaced fracture. These findings are better visualized on recent MRI from 11/27/2021. 2. No acute abnormality of the right femur. No lytic or blastic lesions of the right femur. Electronically Signed   By: Miachel Roux M.D.   On: 12/20/2021 09:57   IR IMAGING GUIDED PORT INSERTION  Result Date: 12/11/2021 INDICATION: Primary adenocarcinoma of the right lung with metastatic disease. Patient presents for port catheter placement for durable venous access. EXAM: IMPLANTED PORT A CATH PLACEMENT WITH ULTRASOUND AND FLUOROSCOPIC GUIDANCE MEDICATIONS: None. ANESTHESIA/SEDATION: Versed 1.5 mg IV; Fentanyl 75 mcg IV; Moderate Sedation Time:  20 minutes The patient was continuously monitored during the procedure by the interventional radiology nurse under my direct supervision. FLUOROSCOPY: Radiation exposure index: 6.7 mGy reference air kerma COMPLICATIONS: None immediate. PROCEDURE: The right neck and chest was prepped with chlorhexidine, and draped in the usual sterile fashion using maximum barrier technique (cap and mask, sterile gown, sterile gloves, large sterile sheet, hand hygiene and cutaneous antiseptic). Local anesthesia was attained  by infiltration with 1% lidocaine with epinephrine. Ultrasound demonstrated patency of the right internal jugular vein, and this was documented with an image. Under real-time ultrasound guidance, this vein was accessed with a 21 gauge micropuncture needle and image documentation was performed.  A small dermatotomy was made at the access site with an 11 scalpel. A 0.018" wire was advanced into the SVC and the access needle exchanged for a 51F micropuncture vascular sheath. The 0.018" wire was then removed and a 0.035" wire advanced into the IVC. An appropriate location for the subcutaneous reservoir was selected below the clavicle and an incision was made through the skin and underlying soft tissues. The subcutaneous tissues were then dissected using a combination of blunt and sharp surgical technique and a pocket was formed. A single lumen power injectable portacatheter was then tunneled through the subcutaneous tissues from the pocket to the dermatotomy and the port reservoir placed within the subcutaneous pocket. The venous access site was then serially dilated and a peel away vascular sheath placed over the wire. The wire was removed and the port catheter advanced into position under fluoroscopic guidance. The catheter tip is positioned in the superior cavoatrial junction. This was documented with a spot image. The portacatheter was then tested and found to flush and aspirate well. The port was flushed with saline followed by 100 units/mL heparinized saline. The pocket was then closed in two layers using first subdermal inverted interrupted absorbable sutures followed by a running subcuticular suture. The epidermis was then sealed with Dermabond. The dermatotomy at the venous access site was also closed with Dermabond. IMPRESSION: Successful placement of a right IJ approach Power Port with ultrasound and fluoroscopic guidance. The catheter is ready for use. Electronically Signed   By: Jacqulynn Cadet M.D.   On:  12/11/2021 16:27

## 2022-01-08 NOTE — Patient Instructions (Signed)
[facility] AT [place]  Discharge Instructions: Thank you for choosing [facility] to provide your oncology and hematology care.  If you have a lab appointment with the [facility], please go directly to the [facility] and check in at the registration area.  Wear comfortable clothing and clothing appropriate for easy access to any Portacath or PICC line.   We strive to give you quality time with your provider. You may need to reschedule your appointment if you arrive late (15 or more minutes).  Arriving late affects you and other patients whose appointments are after yours.  Also, if you miss three or more appointments without notifying the office, you may be dismissed from the clinic at the provider's discretion.      For prescription refill requests, have your pharmacy contact our office and allow 72 hours for refills to be completed.    Today you received the following chemotherapy and/or immunotherapy agents KEYTRUDA, ALIMTA, CARBOPLATIN      To help prevent nausea and vomiting after your treatment, we encourage you to take your nausea medication as directed.  BELOW ARE SYMPTOMS THAT SHOULD BE REPORTED IMMEDIATELY: *FEVER GREATER THAN 100.4 F (38 C) OR HIGHER *CHILLS OR SWEATING *NAUSEA AND VOMITING THAT IS NOT CONTROLLED WITH YOUR NAUSEA MEDICATION *UNUSUAL SHORTNESS OF BREATH *UNUSUAL BRUISING OR BLEEDING *URINARY PROBLEMS (pain or burning when urinating, or frequent urination) *BOWEL PROBLEMS (unusual diarrhea, constipation, pain near the anus) TENDERNESS IN MOUTH AND THROAT WITH OR WITHOUT PRESENCE OF ULCERS (sore throat, sores in mouth, or a toothache) UNUSUAL RASH, SWELLING OR PAIN  UNUSUAL VAGINAL DISCHARGE OR ITCHING   Items with * indicate a potential emergency and should be followed up as soon as possible or go to the Emergency Department if any problems should occur.  Please show the CHEMOTHERAPY ALERT CARD or IMMUNOTHERAPY ALERT  CARD at check-in to the Emergency Department and triage nurse.  Should you have questions after your visit or need to cancel or reschedule your appointment, please contact [facility] [place] AT [place]  [phone] and follow the prompts.  Office hours are 8:00 a.m. to 4:30 p.m. Monday - Friday. Please note that voicemails left after 4:00 p.m. may not be returned until the following business day.  We are closed weekends and major holidays. You have access to a nurse at all times for urgent questions. Please call the main number to the clinic [phone] and follow the prompts.  For any non-urgent questions, you may also contact your provider using MyChart. We now offer e-Visits for anyone 53 and older to request care online for non-urgent symptoms. For details visit [email or web address].   Also download the MyChart app! Go to the app store, search "MyChart", open the app, select [facility], and log in with your MyChart username and password.  Masks are optional in the cancer centers. If you would like for your care team to wear a mask while they are taking care of you, please let them know. For doctor visits, patients may have with them one support person who is at least 70 years old. At this time, visitors are not allowed in the infusion area.  Pembrolizumab Injection What is this medication? PEMBROLIZUMAB (PEM broe LIZ ue mab) treats some types of cancer. It works by helping your immune system slow or stop the spread of cancer cells. It is a monoclonal antibody. This medicine may be used for other purposes; ask your health care provider or pharmacist if you  have questions. COMMON BRAND NAME(S): Keytruda What should I tell my care team before I take this medication? They need to know if you have any of these conditions: Allogeneic stem cell transplant (uses someone else's stem cells) Autoimmune diseases, such as Crohn disease, ulcerative colitis, lupus History of chest  radiation Nervous system problems, such as Guillain-Barre syndrome, myasthenia gravis Organ transplant An unusual or allergic reaction to pembrolizumab, other medications, foods, dyes, or preservatives Pregnant or trying to get pregnant Breast-feeding How should I use this medication? This medication is injected into a vein. It is given by your care team in a hospital or clinic setting. A special MedGuide will be given to you before each treatment. Be sure to read this information carefully each time. Talk to your care team about the use of this medication in children. While it may be prescribed for children as young as 6 months for selected conditions, precautions do apply. Overdosage: If you think you have taken too much of this medicine contact a poison control center or emergency room at once. NOTE: This medicine is only for you. Do not share this medicine with others. What if I miss a dose? Keep appointments for follow-up doses. It is important not to miss your dose. Call your care team if you are unable to keep an appointment. What may interact with this medication? Interactions have not been studied. This list may not describe all possible interactions. Give your health care provider a list of all the medicines, herbs, non-prescription drugs, or dietary supplements you use. Also tell them if you smoke, drink alcohol, or use illegal drugs. Some items may interact with your medicine. What should I watch for while using this medication? Your condition will be monitored carefully while you are receiving this medication. You may need blood work while taking this medication. This medication may cause serious skin reactions. They can happen weeks to months after starting the medication. Contact your care team right away if you notice fevers or flu-like symptoms with a rash. The rash may be red or purple and then turn into blisters or peeling of the skin. You may also notice a red rash with  swelling of the face, lips, or lymph nodes in your neck or under your arms. Tell your care team right away if you have any change in your eyesight. Talk to your care team if you may be pregnant. Serious birth defects can occur if you take this medication during pregnancy and for 4 months after the last dose. You will need a negative pregnancy test before starting this medication. Contraception is recommended while taking this medication and for 4 months after the last dose. Your care team can help you find the option that works for you. Do not breastfeed while taking this medication and for 4 months after the last dose. What side effects may I notice from receiving this medication? Side effects that you should report to your care team as soon as possible: Allergic reactions--skin rash, itching, hives, swelling of the face, lips, tongue, or throat Dry cough, shortness of breath or trouble breathing Eye pain, redness, irritation, or discharge with blurry or decreased vision Heart muscle inflammation--unusual weakness or fatigue, shortness of breath, chest pain, fast or irregular heartbeat, dizziness, swelling of the ankles, feet, or hands Hormone gland problems--headache, sensitivity to light, unusual weakness or fatigue, dizziness, fast or irregular heartbeat, increased sensitivity to cold or heat, excessive sweating, constipation, hair loss, increased thirst or amount of urine, tremors or shaking,  irritability Infusion reactions--chest pain, shortness of breath or trouble breathing, feeling faint or lightheaded Kidney injury (glomerulonephritis)--decrease in the amount of urine, red or dark brown urine, foamy or bubbly urine, swelling of the ankles, hands, or feet Liver injury--right upper belly pain, loss of appetite, nausea, light-colored stool, dark yellow or brown urine, yellowing skin or eyes, unusual weakness or fatigue Pain, tingling, or numbness in the hands or feet, muscle weakness, change in  vision, confusion or trouble speaking, loss of balance or coordination, trouble walking, seizures Rash, fever, and swollen lymph nodes Redness, blistering, peeling, or loosening of the skin, including inside the mouth Sudden or severe stomach pain, bloody diarrhea, fever, nausea, vomiting Side effects that usually do not require medical attention (report to your care team if they continue or are bothersome): Bone, joint, or muscle pain Diarrhea Fatigue Loss of appetite Nausea Skin rash This list may not describe all possible side effects. Call your doctor for medical advice about side effects. You may report side effects to FDA at 1-800-FDA-1088. Where should I keep my medication? This medication is given in a hospital or clinic. It will not be stored at home. NOTE: This sheet is a summary. It may not cover all possible information. If you have questions about this medicine, talk to your doctor, pharmacist, or health care provider.  2023 Elsevier/Gold Standard (2021-07-30 00:00:00)  Pemetrexed Injection What is this medication? PEMETREXED (PEM e TREX ed) treats some types of cancer. It works by slowing down the growth of cancer cells. This medicine may be used for other purposes; ask your health care provider or pharmacist if you have questions. COMMON BRAND NAME(S): Alimta, PEMFEXY What should I tell my care team before I take this medication? They need to know if you have any of these conditions: Infection, such as chickenpox, cold sores, or herpes Kidney disease Low blood cell levels (white cells, red cells, and platelets) Lung or breathing disease, such as asthma Radiation therapy An unusual or allergic reaction to pemetrexed, other medications, foods, dyes, or preservatives If you or your partner are pregnant or trying to get pregnant Breast-feeding How should I use this medication? This medication is injected into a vein. It is given by your care team in a hospital or clinic  setting. Talk to your care team about the use of this medication in children. Special care may be needed. Overdosage: If you think you have taken too much of this medicine contact a poison control center or emergency room at once. NOTE: This medicine is only for you. Do not share this medicine with others. What if I miss a dose? Keep appointments for follow-up doses. It is important not to miss your dose. Call your care team if you are unable to keep an appointment. What may interact with this medication? Do not take this medication with any of the following: Live virus vaccines This medication may also interact with the following: Ibuprofen This list may not describe all possible interactions. Give your health care provider a list of all the medicines, herbs, non-prescription drugs, or dietary supplements you use. Also tell them if you smoke, drink alcohol, or use illegal drugs. Some items may interact with your medicine. What should I watch for while using this medication? Your condition will be monitored carefully while you are receiving this medication. This medication may make you feel generally unwell. This is not uncommon as chemotherapy can affect healthy cells as well as cancer cells. Report any side effects. Continue your course  of treatment even though you feel ill unless your care team tells you to stop. This medication can cause serious side effects. To reduce the risk, your care team may give you other medications to take before receiving this one. Be sure to follow the directions from your care team. This medication can cause a rash or redness in areas of the body that have previously had radiation therapy. If you have had radiation therapy, tell your care team if you notice a rash in this area. This medication may increase your risk of getting an infection. Call your care team for advice if you get a fever, chills, sore throat, or other symptoms of a cold or flu. Do not treat  yourself. Try to avoid being around people who are sick. Be careful brushing or flossing your teeth or using a toothpick because you may get an infection or bleed more easily. If you have any dental work done, tell your dentist you are receiving this medication. Avoid taking medications that contain aspirin, acetaminophen, ibuprofen, naproxen, or ketoprofen unless instructed by your care team. These medications may hide a fever. Check with your care team if you have severe diarrhea, nausea, and vomiting, or if you sweat a lot. The loss of too much body fluid may make it dangerous for you to take this medication. Talk to your care team if you or your partner wish to become pregnant or think either of you might be pregnant. This medication can cause serious birth defects if taken during pregnancy and for 6 months after the last dose. A negative pregnancy test is required before starting this medication. A reliable form of contraception is recommended while taking this medication and for 6 months after the last dose. Talk to your care team about reliable forms of contraception. Do not father a child while taking this medication and for 3 months after the last dose. Use a condom while having sex during this time period. Do not breastfeed while taking this medication and for 1 week after the last dose. This medication may cause infertility. Talk to your care team if you are concerned about your fertility. What side effects may I notice from receiving this medication? Side effects that you should report to your care team as soon as possible: Allergic reactions--skin rash, itching, hives, swelling of the face, lips, tongue, or throat Dry cough, shortness of breath or trouble breathing Infection--fever, chills, cough, sore throat, wounds that don't heal, pain or trouble when passing urine, general feeling of discomfort or being unwell Kidney injury--decrease in the amount of urine, swelling of the ankles, hands,  or feet Low red blood cell level--unusual weakness or fatigue, dizziness, headache, trouble breathing Redness, blistering, peeling, or loosening of the skin, including inside the mouth Unusual bruising or bleeding Side effects that usually do not require medical attention (report to your care team if they continue or are bothersome): Fatigue Loss of appetite Nausea Vomiting This list may not describe all possible side effects. Call your doctor for medical advice about side effects. You may report side effects to FDA at 1-800-FDA-1088. Where should I keep my medication? This medication is given in a hospital or clinic. It will not be stored at home. NOTE: This sheet is a summary. It may not cover all possible information. If you have questions about this medicine, talk to your doctor, pharmacist, or health care provider.  2023 Elsevier/Gold Standard (2021-08-13 00:00:00)   Carboplatin Injection What is this medication? CARBOPLATIN (KAR boe pla tin)  treats some types of cancer. It works by slowing down the growth of cancer cells. This medicine may be used for other purposes; ask your health care provider or pharmacist if you have questions. COMMON BRAND NAME(S): Paraplatin What should I tell my care team before I take this medication? They need to know if you have any of these conditions: Blood disorders Hearing problems Kidney disease Recent or ongoing radiation therapy An unusual or allergic reaction to carboplatin, cisplatin, other medications, foods, dyes, or preservatives Pregnant or trying to get pregnant Breast-feeding How should I use this medication? This medication is injected into a vein. It is given by your care team in a hospital or clinic setting. Talk to your care team about the use of this medication in children. Special care may be needed. Overdosage: If you think you have taken too much of this medicine contact a poison control center or emergency room at once. NOTE:  This medicine is only for you. Do not share this medicine with others. What if I miss a dose? Keep appointments for follow-up doses. It is important not to miss your dose. Call your care team if you are unable to keep an appointment. What may interact with this medication? Medications for seizures Some antibiotics, such as amikacin, gentamicin, neomycin, streptomycin, tobramycin Vaccines This list may not describe all possible interactions. Give your health care provider a list of all the medicines, herbs, non-prescription drugs, or dietary supplements you use. Also tell them if you smoke, drink alcohol, or use illegal drugs. Some items may interact with your medicine. What should I watch for while using this medication? Your condition will be monitored carefully while you are receiving this medication. You may need blood work while taking this medication. This medication may make you feel generally unwell. This is not uncommon, as chemotherapy can affect healthy cells as well as cancer cells. Report any side effects. Continue your course of treatment even though you feel ill unless your care team tells you to stop. In some cases, you may be given additional medications to help with side effects. Follow all directions for their use. This medication may increase your risk of getting an infection. Call your care team for advice if you get a fever, chills, sore throat, or other symptoms of a cold or flu. Do not treat yourself. Try to avoid being around people who are sick. Avoid taking medications that contain aspirin, acetaminophen, ibuprofen, naproxen, or ketoprofen unless instructed by your care team. These medications may hide a fever. Be careful brushing or flossing your teeth or using a toothpick because you may get an infection or bleed more easily. If you have any dental work done, tell your dentist you are receiving this medication. Talk to your care team if you wish to become pregnant or think  you might be pregnant. This medication can cause serious birth defects. Talk to your care team about effective forms of contraception. Do not breast-feed while taking this medication. What side effects may I notice from receiving this medication? Side effects that you should report to your care team as soon as possible: Allergic reactions--skin rash, itching, hives, swelling of the face, lips, tongue, or throat Infection--fever, chills, cough, sore throat, wounds that don't heal, pain or trouble when passing urine, general feeling of discomfort or being unwell Low red blood cell level--unusual weakness or fatigue, dizziness, headache, trouble breathing Pain, tingling, or numbness in the hands or feet, muscle weakness, change in vision, confusion or trouble speaking,  loss of balance or coordination, trouble walking, seizures Unusual bruising or bleeding Side effects that usually do not require medical attention (report to your care team if they continue or are bothersome): Hair loss Nausea Unusual weakness or fatigue Vomiting This list may not describe all possible side effects. Call your doctor for medical advice about side effects. You may report side effects to FDA at 1-800-FDA-1088. Where should I keep my medication? This medication is given in a hospital or clinic. It will not be stored at home. NOTE: This sheet is a summary. It may not cover all possible information. If you have questions about this medicine, talk to your doctor, pharmacist, or health care provider.  2023 Elsevier/Gold Standard (2021-07-31 00:00:00)

## 2022-01-09 ENCOUNTER — Other Ambulatory Visit: Payer: Self-pay

## 2022-01-11 ENCOUNTER — Other Ambulatory Visit: Payer: Self-pay

## 2022-01-14 ENCOUNTER — Other Ambulatory Visit: Payer: Self-pay | Admitting: *Deleted

## 2022-01-14 ENCOUNTER — Encounter: Payer: Self-pay | Admitting: *Deleted

## 2022-01-14 ENCOUNTER — Other Ambulatory Visit: Payer: Self-pay

## 2022-01-14 ENCOUNTER — Ambulatory Visit: Payer: PPO

## 2022-01-14 DIAGNOSIS — C3490 Malignant neoplasm of unspecified part of unspecified bronchus or lung: Secondary | ICD-10-CM

## 2022-01-15 ENCOUNTER — Inpatient Hospital Stay: Payer: PPO

## 2022-01-17 ENCOUNTER — Inpatient Hospital Stay: Payer: PPO

## 2022-01-17 ENCOUNTER — Other Ambulatory Visit: Payer: Self-pay | Admitting: Internal Medicine

## 2022-01-17 ENCOUNTER — Encounter: Payer: Self-pay | Admitting: Hospice and Palliative Medicine

## 2022-01-17 ENCOUNTER — Inpatient Hospital Stay (HOSPITAL_BASED_OUTPATIENT_CLINIC_OR_DEPARTMENT_OTHER): Payer: PPO | Admitting: Hospice and Palliative Medicine

## 2022-01-17 VITALS — BP 103/59 | HR 83 | Temp 99.0°F | Resp 17 | Wt 145.2 lb

## 2022-01-17 DIAGNOSIS — C3490 Malignant neoplasm of unspecified part of unspecified bronchus or lung: Secondary | ICD-10-CM

## 2022-01-17 DIAGNOSIS — Z5111 Encounter for antineoplastic chemotherapy: Secondary | ICD-10-CM | POA: Diagnosis not present

## 2022-01-17 LAB — CBC WITH DIFFERENTIAL/PLATELET
Abs Immature Granulocytes: 0.02 10*3/uL (ref 0.00–0.07)
Basophils Absolute: 0 10*3/uL (ref 0.0–0.1)
Basophils Relative: 1 %
Eosinophils Absolute: 0 10*3/uL (ref 0.0–0.5)
Eosinophils Relative: 2 %
HCT: 28.9 % — ABNORMAL LOW (ref 36.0–46.0)
Hemoglobin: 10 g/dL — ABNORMAL LOW (ref 12.0–15.0)
Immature Granulocytes: 1 %
Lymphocytes Relative: 35 %
Lymphs Abs: 0.5 10*3/uL — ABNORMAL LOW (ref 0.7–4.0)
MCH: 31.6 pg (ref 26.0–34.0)
MCHC: 34.6 g/dL (ref 30.0–36.0)
MCV: 91.5 fL (ref 80.0–100.0)
Monocytes Absolute: 0.2 10*3/uL (ref 0.1–1.0)
Monocytes Relative: 10 %
Neutro Abs: 0.8 10*3/uL — ABNORMAL LOW (ref 1.7–7.7)
Neutrophils Relative %: 51 %
Platelets: 126 10*3/uL — ABNORMAL LOW (ref 150–400)
RBC: 3.16 MIL/uL — ABNORMAL LOW (ref 3.87–5.11)
RDW: 11.9 % (ref 11.5–15.5)
WBC: 1.5 10*3/uL — ABNORMAL LOW (ref 4.0–10.5)
nRBC: 1.3 % — ABNORMAL HIGH (ref 0.0–0.2)

## 2022-01-17 LAB — COMPREHENSIVE METABOLIC PANEL
ALT: 29 U/L (ref 0–44)
AST: 32 U/L (ref 15–41)
Albumin: 3.4 g/dL — ABNORMAL LOW (ref 3.5–5.0)
Alkaline Phosphatase: 122 U/L (ref 38–126)
Anion gap: 4 — ABNORMAL LOW (ref 5–15)
BUN: 20 mg/dL (ref 8–23)
CO2: 26 mmol/L (ref 22–32)
Calcium: 9 mg/dL (ref 8.9–10.3)
Chloride: 104 mmol/L (ref 98–111)
Creatinine, Ser: 1.06 mg/dL — ABNORMAL HIGH (ref 0.44–1.00)
GFR, Estimated: 57 mL/min — ABNORMAL LOW (ref >60)
Glucose, Bld: 108 mg/dL — ABNORMAL HIGH (ref 70–99)
Potassium: 4.2 mmol/L (ref 3.5–5.1)
Sodium: 134 mmol/L — ABNORMAL LOW (ref 135–145)
Total Bilirubin: 0.5 mg/dL (ref 0.3–1.2)
Total Protein: 7 g/dL (ref 6.5–8.1)

## 2022-01-17 MED ORDER — SODIUM CHLORIDE 0.9 % IV SOLN
Freq: Once | INTRAVENOUS | Status: AC
  Filled 2022-01-17: qty 250

## 2022-01-17 MED ORDER — LEVOFLOXACIN 500 MG PO TABS: 500.0000 mg | ORAL_TABLET | Freq: Every day | ORAL | 0 refills | Status: DC

## 2022-01-17 MED ORDER — HEPARIN SOD (PORK) LOCK FLUSH 100 UNIT/ML IV SOLN
500.0000 [IU] | Freq: Once | INTRAVENOUS | Status: AC | PRN
  Administered 2022-01-17: 500 [IU]
  Filled 2022-01-17: qty 5

## 2022-01-17 MED ORDER — SODIUM CHLORIDE 0.9% FLUSH
10.0000 mL | Freq: Once | INTRAVENOUS | Status: AC | PRN
  Administered 2022-01-17: 10 mL
  Filled 2022-01-17: qty 10

## 2022-01-17 NOTE — Progress Notes (Signed)
Patient was given an appt earlier in week for [facility] and possible fluids. Pt states no one called or messaged her back to let her know an appt had been made. Pt showed up in clinic today requesting IVF as she feels "dry". Appt made for [facility] and IVF's. Pt is eating and drinking. Denies N/V. Denies pain. Mild constipation not requiring any medication. Has some fatigue.

## 2022-01-17 NOTE — Progress Notes (Signed)
Symptom Management [place] at [facility] [phone]) [phone] [phone]) [phone]  Patient Care Team: Earlie Server, MD as PCP - General (Oncology) Telford Nab, RN as Oncology Nurse Navigator   NAME OF PATIENT: Victoria Foley  [id number]  09-20-1950   DATE OF VISIT: 01/17/22  REASON FOR CONSULT: RITHA SAMPEDRO is a 71 y.o. 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  Patient is status post RT to the right hip due to nondisplaced pathologic fracture.  She is on systemic chemotherapy/immunotherapy  INTERVAL HISTORY: Patient saw Dr. Darrall Dears on 01/08/2022 for cycle 2 carbo/Alimta/Keytruda.  Patient was a walk-in to clinic requesting IV fluids due to feeling dehydrated but otherwise feels like she is doing well.  She endorses poor oral intake recently.  She denies fever or chills.  No nausea, vomiting, or diarrhea.  She does have mild constipation for which she takes MiraLAX.  Denies any neurologic complaints. Denies recent fevers or illnesses. Denies any easy bleeding or bruising. Denies chest pain. Denies any nausea, vomiting, or diarrhea. Denies urinary complaints. Patient offers no further specific complaints today.   PAST MEDICAL HISTORY: Past Medical History:  Diagnosis Date   Anemia    Anxiety    Arthritis    COVID-19    03/2021   Hypertension    Pneumonia    Pre-diabetes    Prediabetes    Sciatica     PAST SURGICAL HISTORY:  Past Surgical History:  Procedure Laterality Date   CESAREAN SECTION     COLONOSCOPY W/ POLYPECTOMY     ECTOPIC PREGNANCY SURGERY     FEMUR SURGERY     left, s/p rod for fracture   IR IMAGING GUIDED PORT INSERTION  12/11/2021   TUBAL LIGATION      HEMATOLOGY/ONCOLOGY HISTORY:  Oncology History  Primary lung adenocarcinoma ([place])  10/10/2021 Initial Diagnosis   Primary lung adenocarcinoma ([place]) Stage  IV  Patient seen by PCP for wheezing for 2 weeks. Imaging with CXR followed by CT chest showed Spiculated 2.1 x 2.2 cm left upper lobe pulmonary nodule with associated pleural tethering. Innumerable subcentimeter pulmonary nodules throughout the lungs.      11/14/2021 PET scan   IMPRESSION: 1. Left suprahilar nodule with maximum SUV 4.5, and a more distal 2.2 cm left upper lobe nodule with maximum SUV of 5.5. 2. Innumerable scattered small pulmonary nodules, most of which are under 5 mm in diameter, throughout both lungs. Some are cavitary. Possibilities include cavitating hematogenous disseminated malignancy versus infectious etiology subset as septic emboli. Most of the nodules are stable, some are minimally enlarged compared to 10/10/2021. 3. Abnormal mild permeative type bony findings in the right hemipelvis associated with substantially accentuated metabolic activity. Possible associated pathologic fracture through the right quadrilateral plate and right acetabulum. The findings involve the right ilium and ischium but also the right lateral sacral ala and a small focus in the left sacrum.    Pathology Results   She had transbronchial biopsies by Dr. Patsey Berthold on 11/19/21.  Pathology from LUL nodule showed adenocarcinoma, consistent with lung primary.    11/19/2021 Cancer Staging   Staging form: Lung, AJCC 8th Edition - Clinical stage from 11/19/2021: Stage IVB (cT1c, cM1c) - Signed by Jane Canary, MD on 11/27/2021 Stage prefix: Initial diagnosis   11/27/2021 Imaging   MRI pelvis  IMPRESSION: 1. Abnormal marrow signal throughout the right ilium involving the right acetabulum, right superior pubic  ramus and right inferior pubic ramus, right side of the sacrum and a smaller focus of abnormal marrow lesion in the left side of the sacrum. Findings are concerning for metastatic disease. 2. Nondisplaced pathologic fracture of the quadrilateral plate of the right acetabulum. 3. Mild  muscle edema in the right gluteus minimus and medius muscles likely reflecting mild muscle strain.  MRI brain  IMPRESSION: No evidence of intracranial metastatic disease.   Indeterminate 1 cm left frontal calvarium lesion.   12/18/2021 -  Chemotherapy   Patient is on Treatment Plan : LUNG Carboplatin (5) + Pemetrexed (500) + Pembrolizumab (200) D1 q21d Induction x 4 cycles / Maintenance Pemetrexed (500) + Pembrolizumab (200) D1 q21d       ALLERGIES:  is allergic to diclofenac, lisinopril, and sulfa antibiotics.  MEDICATIONS:  Current Outpatient Medications  Medication Sig Dispense Refill   albuterol (VENTOLIN HFA) 108 (90 Base) MCG/ACT inhaler Inhale 2 puffs into the lungs every 6 (six) hours as needed for wheezing or shortness of breath. 18 g 0   benzonatate (TESSALON) 200 MG capsule Take 1 capsule (200 mg total) by mouth 3 (three) times daily as needed for cough. 30 capsule 0   cholecalciferol (VITAMIN D3) 25 MCG (1000 UNIT) tablet Take 1,000 Units by mouth daily.     Chromium Picolinate (CHROMIUM PICOLATE PO) Take by mouth daily.     dexamethasone (DECADRON) 4 MG tablet Starting next cycle, take 4 mg two times a day, a day prior to chemotherapy. Do not take on the day of chemotherapy. Then take for one day after chemotherapy. 20 tablet 0   fluticasone (FLONASE) 50 MCG/ACT nasal spray USE TWO SPRAYS IN EACH NOSTRIL ONCE DAILY prn 16 g 12   folic acid (FOLVITE) 1 MG tablet Take 1 tablet (1 mg total) by mouth daily. Start 7 days before pemetrexed chemotherapy. Continue until 21 days after pemetrexed completed. 100 tablet 3   lidocaine-prilocaine (EMLA) cream Apply to port site 30 mins prior using once 30 g 3   loratadine (CLARITIN) 10 MG tablet TAKE ONE TABLET BY MOUTH DAILY AS NEEDED FOR ALLERGIES 90 tablet 3   losartan (COZAAR) 100 MG tablet Take 1 tablet (100 mg total) by mouth daily. 90 tablet 3   Multiple Vitamin (ONE-DAILY MULTI-VITAMIN PO) Take by mouth daily.     nebivolol  (BYSTOLIC) 2.5 MG tablet Take 1 tablet (2.5 mg total) by mouth daily. 90 tablet 3   Omega-3 Fatty Acids (FISH OIL) 1000 MG CAPS Take by mouth.     omeprazole (PRILOSEC) 20 MG capsule Take 1 capsule (20 mg total) by mouth daily. 10 capsule 0   ondansetron (ZOFRAN) 8 MG tablet Take 1 tablet (8 mg total) by mouth every 8 (eight) hours as needed for nausea or vomiting. Start on the third day after carboplatin. (Patient not taking: Reported on 12/18/2021) 30 tablet 1   prochlorperazine (COMPAZINE) 10 MG tablet Take 1 tablet (10 mg total) by mouth every 6 (six) hours as needed for nausea or vomiting. (Patient not taking: Reported on 12/18/2021) 30 tablet 1   Turmeric (QC TUMERIC COMPLEX PO) Take 2 tablets by mouth daily. (Patient not taking: Reported on 11/28/2021)     No current facility-administered medications for this visit.   Facility-Administered Medications Ordered in Other Visits  Medication Dose Route Frequency Provider Last Rate Last Admin   heparin lock flush 100 UNIT/ML injection            heparin lock flush 100 unit/mL  500  Units Intracatheter Once PRN Jane Canary, MD        VITAL SIGNS: BP (!) 103/59 (BP Location: Left Arm, Patient Position: Sitting)   Pulse 83   Temp 99 F (37.2 C) (Tympanic)   Resp 17   Wt 145 lb 3.2 oz (65.9 kg)   SpO2 100%   BMI 29.33 kg/m  Filed Weights   01/17/22 1427  Weight: 145 lb 3.2 oz (65.9 kg)    Estimated body mass index is 29.33 kg/m as calculated from the following:   Height as of 12/20/21: 4\' 11"  (1.499 m).   Weight as of this encounter: 145 lb 3.2 oz (65.9 kg).  LABS: CBC:    Component Value Date/Time   WBC 1.5 (L) 01/17/2022 1359   HGB 10.0 (L) 01/17/2022 1359   HCT 28.9 (L) 01/17/2022 1359   PLT 126 (L) 01/17/2022 1359   MCV 91.5 01/17/2022 1359   NEUTROABS 0.8 (L) 01/17/2022 1359   LYMPHSABS 0.5 (L) 01/17/2022 1359   MONOABS 0.2 01/17/2022 1359   EOSABS 0.0 01/17/2022 1359   BASOSABS 0.0 01/17/2022 1359   Comprehensive  Metabolic Panel:    Component Value Date/Time   NA 134 (L) 01/17/2022 1359   K 4.2 01/17/2022 1359   CL 104 01/17/2022 1359   CO2 26 01/17/2022 1359   BUN 20 01/17/2022 1359   CREATININE 1.06 (H) 01/17/2022 1359   GLUCOSE 108 (H) 01/17/2022 1359   CALCIUM 9.0 01/17/2022 1359   AST 32 01/17/2022 1359   ALT 29 01/17/2022 1359   ALKPHOS 122 01/17/2022 1359   BILITOT 0.5 01/17/2022 1359   PROT 7.0 01/17/2022 1359   ALBUMIN 3.4 (L) 01/17/2022 1359    RADIOGRAPHIC STUDIES: DG FEMUR, MIN 2 VIEWS RIGHT  Result Date: 12/20/2021 CLINICAL DATA:  Right hip pain and difficulty weight-bearing Known metastatic disease EXAM: RIGHT FEMUR 2 VIEWS; DG HIP (WITH OR WITHOUT PELVIS) 2-3V RIGHT COMPARISON:  MRI pelvis 11/27/2021 FINDINGS: Right hip: Left femoral intramedullary nail partially visualized. The visualized portions are intact. Advanced degenerative changes of the lower lumbar spine are partially visualized. Irregular lucency and sclerosis of the right acetabulum, superior and inferior pubic rami is consistent with known nondisplaced fracture and metastatic involvement. Right femur: No fracture or dislocation. No osseous erosions, lytic, or blastic lesions. IMPRESSION: 1. Irregular lucency and sclerosis of the right acetabulum, superior and inferior pubic rami is consistent with known metastatic involvement and nondisplaced fracture. These findings are better visualized on recent MRI from 11/27/2021. 2. No acute abnormality of the right femur. No lytic or blastic lesions of the right femur. Electronically Signed   By: Miachel Roux M.D.   On: 12/20/2021 09:57   DG HIP UNILAT W OR W/O PELVIS 2-3 VIEWS RIGHT  Result Date: 12/20/2021 CLINICAL DATA:  Right hip pain and difficulty weight-bearing Known metastatic disease EXAM: RIGHT FEMUR 2 VIEWS; DG HIP (WITH OR WITHOUT PELVIS) 2-3V RIGHT COMPARISON:  MRI pelvis 11/27/2021 FINDINGS: Right hip: Left femoral intramedullary nail partially visualized. The  visualized portions are intact. Advanced degenerative changes of the lower lumbar spine are partially visualized. Irregular lucency and sclerosis of the right acetabulum, superior and inferior pubic rami is consistent with known nondisplaced fracture and metastatic involvement. Right femur: No fracture or dislocation. No osseous erosions, lytic, or blastic lesions. IMPRESSION: 1. Irregular lucency and sclerosis of the right acetabulum, superior and inferior pubic rami is consistent with known metastatic involvement and nondisplaced fracture. These findings are better visualized on recent MRI from 11/27/2021. 2.  No acute abnormality of the right femur. No lytic or blastic lesions of the right femur. Electronically Signed   By: Miachel Roux M.D.   On: 12/20/2021 09:57    PERFORMANCE STATUS (ECOG) : 1 - Symptomatic but completely ambulatory  Review of Systems Unless otherwise noted, a complete review of systems is negative.  Physical Exam General: NAD Cardiovascular: regular rate and rhythm Pulmonary: clear ant fields Abdomen: soft, nontender, + bowel sounds GU: no suprapubic tenderness Extremities: no edema, no joint deformities Skin: no rashes Neurological: Weakness but otherwise nonfocal  IMPRESSION/PLAN: Dehydration -Labs appear consistent with dehydration with mild hyponatremia/AKI.  Proceed with IV fluids.  Neutropenia -likely secondary to chemotherapy.  Patient should be at nadir.  Likely will need G-CSF in future.  Discussed with Dr. Doyne Keel and will proceed with prophylactic Levaquin.  Plan to bring patient back next week for labs/fluids   Patient expressed understanding and was in agreement with this plan. She also understands that She can call clinic at any time with any questions, concerns, or complaints.   Thank you for allowing me to participate in the care of this very pleasant patient.   Time Total: 15 minutes  Visit consisted of counseling and education dealing with the  complex and emotionally intense issues of symptom management in the setting of serious illness.Greater than 50%  of this time was spent counseling and coordinating care related to the above assessment and plan.  Signed by: Altha Harm, PhD, NP-C

## 2022-01-17 NOTE — Progress Notes (Signed)
Patient came for infusion today.  CBC showed WBC 1.5 with ANC 0.8. no signs of infection. Will do prophylactic FQ for 7 days.   Will obtain auth for Udenyca.

## 2022-01-18 ENCOUNTER — Other Ambulatory Visit: Payer: Self-pay

## 2022-01-21 ENCOUNTER — Inpatient Hospital Stay: Payer: PPO | Attending: Internal Medicine

## 2022-01-21 ENCOUNTER — Other Ambulatory Visit: Payer: Self-pay

## 2022-01-21 ENCOUNTER — Inpatient Hospital Stay: Payer: PPO

## 2022-01-21 ENCOUNTER — Ambulatory Visit
Admission: RE | Admit: 2022-01-21 | Discharge: 2022-01-21 | Disposition: A | Discharge status: Home / Self Care | Payer: PPO | Source: Ambulatory Visit | Attending: Radiation Oncology | Admitting: Radiation Oncology

## 2022-01-21 ENCOUNTER — Other Ambulatory Visit: Payer: Self-pay | Admitting: *Deleted

## 2022-01-21 ENCOUNTER — Inpatient Hospital Stay (HOSPITAL_BASED_OUTPATIENT_CLINIC_OR_DEPARTMENT_OTHER): Payer: PPO | Admitting: Medical Oncology

## 2022-01-21 VITALS — BP 112/67 | HR 98 | Temp 98.6°F | Resp 18 | Ht 59.0 in | Wt 146.2 lb

## 2022-01-21 DIAGNOSIS — C7951 Secondary malignant neoplasm of bone: Secondary | ICD-10-CM | POA: Diagnosis not present

## 2022-01-21 DIAGNOSIS — Z8616 Personal history of COVID-19: Secondary | ICD-10-CM | POA: Insufficient documentation

## 2022-01-21 DIAGNOSIS — R918 Other nonspecific abnormal finding of lung field: Secondary | ICD-10-CM | POA: Diagnosis not present

## 2022-01-21 DIAGNOSIS — Z7952 Long term (current) use of systemic steroids: Secondary | ICD-10-CM | POA: Diagnosis not present

## 2022-01-21 DIAGNOSIS — C3490 Malignant neoplasm of unspecified part of unspecified bronchus or lung: Secondary | ICD-10-CM

## 2022-01-21 DIAGNOSIS — Z923 Personal history of irradiation: Secondary | ICD-10-CM | POA: Diagnosis not present

## 2022-01-21 DIAGNOSIS — Z79899 Other long term (current) drug therapy: Secondary | ICD-10-CM | POA: Insufficient documentation

## 2022-01-21 DIAGNOSIS — T887XXA Unspecified adverse effect of drug or medicament, initial encounter: Secondary | ICD-10-CM

## 2022-01-21 DIAGNOSIS — I10 Essential (primary) hypertension: Secondary | ICD-10-CM | POA: Diagnosis not present

## 2022-01-21 DIAGNOSIS — M129 Arthropathy, unspecified: Secondary | ICD-10-CM | POA: Diagnosis not present

## 2022-01-21 DIAGNOSIS — R252 Cramp and spasm: Secondary | ICD-10-CM | POA: Diagnosis not present

## 2022-01-21 DIAGNOSIS — C3412 Malignant neoplasm of upper lobe, left bronchus or lung: Secondary | ICD-10-CM | POA: Diagnosis not present

## 2022-01-21 DIAGNOSIS — E860 Dehydration: Secondary | ICD-10-CM

## 2022-01-21 DIAGNOSIS — D649 Anemia, unspecified: Secondary | ICD-10-CM | POA: Diagnosis not present

## 2022-01-21 DIAGNOSIS — Z5111 Encounter for antineoplastic chemotherapy: Secondary | ICD-10-CM | POA: Diagnosis not present

## 2022-01-21 DIAGNOSIS — R609 Edema, unspecified: Secondary | ICD-10-CM | POA: Insufficient documentation

## 2022-01-21 DIAGNOSIS — R059 Cough, unspecified: Secondary | ICD-10-CM | POA: Diagnosis not present

## 2022-01-21 DIAGNOSIS — M6282 Rhabdomyolysis: Secondary | ICD-10-CM

## 2022-01-21 LAB — BASIC METABOLIC PANEL
Anion gap: 4 — ABNORMAL LOW (ref 5–15)
BUN: 18 mg/dL (ref 8–23)
CO2: 25 mmol/L (ref 22–32)
Calcium: 8.9 mg/dL (ref 8.9–10.3)
Chloride: 110 mmol/L (ref 98–111)
Creatinine, Ser: 1.33 mg/dL — ABNORMAL HIGH (ref 0.44–1.00)
GFR, Estimated: 43 mL/min — ABNORMAL LOW (ref >60)
Glucose, Bld: 113 mg/dL — ABNORMAL HIGH (ref 70–99)
Potassium: 4 mmol/L (ref 3.5–5.1)
Sodium: 139 mmol/L (ref 135–145)

## 2022-01-21 LAB — CBC WITH DIFFERENTIAL/PLATELET
Abs Immature Granulocytes: 0.02 10*3/uL (ref 0.00–0.07)
Basophils Absolute: 0 10*3/uL (ref 0.0–0.1)
Basophils Relative: 0 %
Eosinophils Absolute: 0 10*3/uL (ref 0.0–0.5)
Eosinophils Relative: 1 %
HCT: 26 % — ABNORMAL LOW (ref 36.0–46.0)
Hemoglobin: 9.1 g/dL — ABNORMAL LOW (ref 12.0–15.0)
Immature Granulocytes: 1 %
Lymphocytes Relative: 24 %
Lymphs Abs: 0.5 10*3/uL — ABNORMAL LOW (ref 0.7–4.0)
MCH: 31.9 pg (ref 26.0–34.0)
MCHC: 35 g/dL (ref 30.0–36.0)
MCV: 91.2 fL (ref 80.0–100.0)
Monocytes Absolute: 0.3 10*3/uL (ref 0.1–1.0)
Monocytes Relative: 16 %
Neutro Abs: 1.3 10*3/uL — ABNORMAL LOW (ref 1.7–7.7)
Neutrophils Relative %: 58 %
Platelets: 89 10*3/uL — ABNORMAL LOW (ref 150–400)
RBC: 2.85 MIL/uL — ABNORMAL LOW (ref 3.87–5.11)
RDW: 11.9 % (ref 11.5–15.5)
WBC: 2.1 10*3/uL — ABNORMAL LOW (ref 4.0–10.5)
nRBC: 0 % (ref 0.0–0.2)

## 2022-01-21 LAB — MAGNESIUM: Magnesium: 2 mg/dL (ref 1.7–2.4)

## 2022-01-21 LAB — CK: Total CK: 66 U/L (ref 38–234)

## 2022-01-21 MED ORDER — SODIUM CHLORIDE 0.9% FLUSH
10.0000 mL | Freq: Once | INTRAVENOUS | Status: AC
  Administered 2022-01-21: 10 mL via INTRAVENOUS
  Filled 2022-01-21: qty 10

## 2022-01-21 MED ORDER — HEPARIN SOD (PORK) LOCK FLUSH 100 UNIT/ML IV SOLN
500.0000 [IU] | Freq: Once | INTRAVENOUS | Status: AC
  Administered 2022-01-21: 500 [IU] via INTRAVENOUS
  Filled 2022-01-21: qty 5

## 2022-01-21 MED ORDER — SODIUM CHLORIDE 0.9 % IV SOLN
Freq: Once | INTRAVENOUS | Status: AC
  Filled 2022-01-21: qty 250

## 2022-01-21 NOTE — Progress Notes (Signed)
Radiation Oncology Follow up Note  Name: Victoria Foley   Date:   01/21/2022 MRN:  [id number] DOB: 04/27/1950    This 71 y.o. 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  REFERRING PROVIDER: McLean-Scocuzza, Olivia Mackie *  HPI: Patient is a 71 year old female now at 1 month having completed palliative radiation therapy to her right hip and right pelvis for metastatic involvement of stage IV adenocarcinoma the lung.  Seen today in routine follow-up she is doing well she still ambulates with some pain although it is markedly improved..  She is currently on carboplatinum Alimta and Keytruda.  She is tolerating that well.  COMPLICATIONS OF TREATMENT: none  FOLLOW UP COMPLIANCE: keeps appointments   PHYSICAL EXAM:  BP 112/67   Pulse 98   Temp 98.6 F (37 C)   Resp 18   Ht 4\' 11"  (1.499 m)   Wt 146 lb 3.2 oz (66.3 kg)   BMI 29.53 kg/m  Range of motion of her lower extremities does not elicit pain motor or sensory in detail levels are equal and symmetric in lower extremities.  Well-developed well-nourished patient in NAD. HEENT reveals PERLA, EOMI, discs not visualized.  Oral cavity is clear. No oral mucosal lesions are identified. Neck is clear without evidence of cervical or supraclavicular adenopathy. Lungs are clear to A&P. Cardiac examination is essentially unremarkable with regular rate and rhythm without murmur rub or thrill. Abdomen is benign with no organomegaly or masses noted. Motor sensory and DTR levels are equal and symmetric in the upper and lower extremities. Cranial nerves II through XII are grossly intact. Proprioception is intact. No peripheral adenopathy or edema is identified. No motor or sensory levels are noted. Crude visual fields are within normal range.  RADIOLOGY RESULTS: No current films for review  PLAN: Present time she is  achieved good palliative response to treatment to her right hip and pelvis.  And pleased with her overall progress.  I will turn follow-up care over to medical oncology she continues under treatment.  We will be happy to reevaluate her at any time for further palliative treatment.  I would like to take this opportunity to thank you for allowing me to participate in the care of your patient.Noreene Filbert, MD

## 2022-01-21 NOTE — Progress Notes (Unsigned)
Pt came up for leg cramps when walking at times. It started at sat. 9/30. She does not say pain just calls it cramping

## 2022-01-21 NOTE — Progress Notes (Signed)
Symptom Management Bee at [facility] [phone] [phone]  Patient Care Team: Earlie Server, MD as PCP - General (Oncology) Telford Nab, RN as Oncology Nurse Navigator   Name of the patient: Victoria Foley  [id number]  10-05-1950   Date of visit: 01/21/22  Reason for Consult: Victoria Foley is a 71 y.o. female with primary adenocarcinoma who presents today for:   Muscle Cramps: Patient reports that since Saturday she has noticed cramping of her legs when walking. Occurs as soon as she starts to walk and resolves when she stops walking. Occurring on both legs from hips to toes. Feels like ache like. No overt calf pain, SOB, chest pain, palpitation, fever, cold symptoms. No weakness or neuro changes. No other cramps notes. She took a tylenol yesterday which helped. Not good at hydrating with water per patient. No new supplements; only new medication is levaquin which she started on Saturday for sepsis prophylaxis given neutropenia.   PAST MEDICAL HISTORY: Past Medical History:  Diagnosis Date   Anemia    Anxiety    Arthritis    COVID-19    03/2021   Hypertension    Pneumonia    Pre-diabetes    Prediabetes    Sciatica     PAST SURGICAL HISTORY:  Past Surgical History:  Procedure Laterality Date   CESAREAN SECTION     COLONOSCOPY W/ POLYPECTOMY     ECTOPIC PREGNANCY SURGERY     FEMUR SURGERY     left, s/p rod for fracture   IR IMAGING GUIDED PORT INSERTION  12/11/2021   TUBAL LIGATION      HEMATOLOGY/ONCOLOGY HISTORY:  Oncology History  Primary lung adenocarcinoma ([place])  10/10/2021 Initial Diagnosis   Primary lung adenocarcinoma ([place]) Stage IV  Patient seen by PCP for wheezing for 2 weeks. Imaging with CXR followed by CT chest showed Spiculated 2.1 x 2.2 cm left upper lobe pulmonary nodule with associated pleural tethering. Innumerable subcentimeter pulmonary nodules throughout the lungs.      11/14/2021 PET  scan   IMPRESSION: 1. Left suprahilar nodule with maximum SUV 4.5, and a more distal 2.2 cm left upper lobe nodule with maximum SUV of 5.5. 2. Innumerable scattered small pulmonary nodules, most of which are under 5 mm in diameter, throughout both lungs. Some are cavitary. Possibilities include cavitating hematogenous disseminated malignancy versus infectious etiology subset as septic emboli. Most of the nodules are stable, some are minimally enlarged compared to 10/10/2021. 3. Abnormal mild permeative type bony findings in the right hemipelvis associated with substantially accentuated metabolic activity. Possible associated pathologic fracture through the right quadrilateral plate and right acetabulum. The findings involve the right ilium and ischium but also the right lateral sacral ala and a small focus in the left sacrum.    Pathology Results   She had transbronchial biopsies by Dr. Patsey Berthold on 11/19/21.  Pathology from LUL nodule showed adenocarcinoma, consistent with lung primary.    11/19/2021 Cancer Staging   Staging form: Lung, AJCC 8th Edition - Clinical stage from 11/19/2021: Stage IVB (cT1c, cM1c) - Signed by Jane Canary, MD on 11/27/2021 Stage prefix: Initial diagnosis   11/27/2021 Imaging   MRI pelvis  IMPRESSION: 1. Abnormal marrow signal throughout the right ilium involving the right acetabulum, right superior pubic ramus and right inferior pubic ramus, right side of the sacrum and a smaller focus of abnormal marrow lesion in the left side of the sacrum. Findings are concerning for metastatic disease. 2. Nondisplaced pathologic  fracture of the quadrilateral plate of the right acetabulum. 3. Mild muscle edema in the right gluteus minimus and medius muscles likely reflecting mild muscle strain.  MRI brain  IMPRESSION: No evidence of intracranial metastatic disease.   Indeterminate 1 cm left frontal calvarium lesion.   12/18/2021 -  Chemotherapy   Patient is on  Treatment Plan : LUNG Carboplatin (5) + Pemetrexed (500) + Pembrolizumab (200) D1 q21d Induction x 4 cycles / Maintenance Pemetrexed (500) + Pembrolizumab (200) D1 q21d       ALLERGIES:  is allergic to diclofenac, lisinopril, and sulfa antibiotics.  MEDICATIONS:  Current Outpatient Medications  Medication Sig Dispense Refill   albuterol (VENTOLIN HFA) 108 (90 Base) MCG/ACT inhaler Inhale 2 puffs into the lungs every 6 (six) hours as needed for wheezing or shortness of breath. 18 g 0   benzonatate (TESSALON) 200 MG capsule Take 1 capsule (200 mg total) by mouth 3 (three) times daily as needed for cough. 30 capsule 0   Chromium Picolinate (CHROMIUM PICOLATE PO) Take by mouth daily. (Patient not taking: Reported on 01/21/2022)     dexamethasone (DECADRON) 4 MG tablet Starting next cycle, take 4 mg two times a day, a day prior to chemotherapy. Do not take on the day of chemotherapy. Then take for one day after chemotherapy. 20 tablet 0   fluticasone (FLONASE) 50 MCG/ACT nasal spray USE TWO SPRAYS IN EACH NOSTRIL ONCE DAILY prn 16 g 12   folic acid (FOLVITE) 1 MG tablet Take 1 tablet (1 mg total) by mouth daily. Start 7 days before pemetrexed chemotherapy. Continue until 21 days after pemetrexed completed. 100 tablet 3   levofloxacin (LEVAQUIN) 500 MG tablet Take 1 tablet (500 mg total) by mouth daily. 7 tablet 0   lidocaine-prilocaine (EMLA) cream Apply to port site 30 mins prior using once 30 g 3   loratadine (CLARITIN) 10 MG tablet TAKE ONE TABLET BY MOUTH DAILY AS NEEDED FOR ALLERGIES 90 tablet 3   losartan (COZAAR) 100 MG tablet Take 1 tablet (100 mg total) by mouth daily. 90 tablet 3   Multiple Vitamin (ONE-DAILY MULTI-VITAMIN PO) Take by mouth daily.     nebivolol (BYSTOLIC) 2.5 MG tablet Take 1 tablet (2.5 mg total) by mouth daily. 90 tablet 3   Omega-3 Fatty Acids (FISH OIL) 1000 MG CAPS Take by mouth. (Patient not taking: Reported on 01/21/2022)     omeprazole (PRILOSEC) 20 MG capsule Take 1  capsule (20 mg total) by mouth daily. (Patient not taking: Reported on 01/21/2022) 10 capsule 0   ondansetron (ZOFRAN) 8 MG tablet Take 1 tablet (8 mg total) by mouth every 8 (eight) hours as needed for nausea or vomiting. Start on the third day after carboplatin. 30 tablet 1   prochlorperazine (COMPAZINE) 10 MG tablet Take 1 tablet (10 mg total) by mouth every 6 (six) hours as needed for nausea or vomiting. (Patient not taking: Reported on 12/18/2021) 30 tablet 1   Turmeric (QC TUMERIC COMPLEX PO) Take 2 tablets by mouth daily. (Patient not taking: Reported on 11/28/2021)     No current facility-administered medications for this visit.   Facility-Administered Medications Ordered in Other Visits  Medication Dose Route Frequency Provider Last Rate Last Admin   heparin lock flush 100 UNIT/ML injection            heparin lock flush 100 unit/mL  500 Units Intravenous Once Stepfon Rawles M, PA-C        VITAL SIGNS: There were no vitals taken for  this visit. There were no vitals filed for this visit.  Estimated body mass index is 29.29 kg/m as calculated from the following:   Height as of an earlier encounter on 01/21/22: 4\' 11"  (1.499 m).   Weight as of an earlier encounter on 01/21/22: 145 lb (65.8 kg). As obtained in Radiation Oncology just prior to her arrival upstairs: BP 112/67 Pulse 98 Weight 145 Height 4'11"  LABS: CBC:    Component Value Date/Time   WBC 2.1 (L) 01/21/2022 1405   HGB 9.1 (L) 01/21/2022 1405   HCT 26.0 (L) 01/21/2022 1405   PLT 89 (L) 01/21/2022 1405   MCV 91.2 01/21/2022 1405   NEUTROABS 1.3 (L) 01/21/2022 1405   LYMPHSABS 0.5 (L) 01/21/2022 1405   MONOABS 0.3 01/21/2022 1405   EOSABS 0.0 01/21/2022 1405   BASOSABS 0.0 01/21/2022 1405   Comprehensive Metabolic Panel:    Component Value Date/Time   NA 139 01/21/2022 1405   K 4.0 01/21/2022 1405   CL 110 01/21/2022 1405   CO2 25 01/21/2022 1405   BUN 18 01/21/2022 1405   CREATININE 1.33 (H) 01/21/2022  1405   GLUCOSE 113 (H) 01/21/2022 1405   CALCIUM 8.9 01/21/2022 1405   AST 32 01/17/2022 1359   ALT 29 01/17/2022 1359   ALKPHOS 122 01/17/2022 1359   BILITOT 0.5 01/17/2022 1359   PROT 7.0 01/17/2022 1359   ALBUMIN 3.4 (L) 01/17/2022 1359    RADIOGRAPHIC STUDIES: No results found.  PERFORMANCE STATUS (ECOG) : 1 - Symptomatic but completely ambulatory  Review of Systems Unless otherwise noted, a complete review of systems is negative.  Physical Exam General: NAD Cardiovascular: regular rate and rhythm, pedal pulses 2+ bilaterally. No significant varicosities of bilateral legs.  Pulmonary: clear ant fields Extremities: no edema, no joint deformities Skin: no rashes, pallor or edema, rubor MSK: No joint effusion, negative Homans sign bilaterally  Neurological: Extremity strength 5/5 bilaterally   Assessment and Plan- Patient is a 71 y.o. female    Encounter Diagnosis  Name Primary?   Muscle cramping Yes   Muscle cramping is new. CBC shows hemoglobin of 9.1 (10 4 days ago), WBC 2.1 up from 1.5 4 days ago. ANC is up to 1.3 from 0.8, BMP shows normal sodium, normal potassium, magnesium is also normal. Creatinine shows slight elevation to 1.33 from 1.06 (likely secondary to dehydration). Suspect cause is dehydration. 1L IVF administered. She will continue oral supplementation of fluids. She will finish her Levaquin course ideally however if cramping does not improve we will elect to hold this medication as her neutropenia has improved.    Patient expressed understanding and was in agreement with this plan. She also understands that She can call clinic at any time with any questions, concerns, or complaints.   Thank you for allowing me to participate in the care of this very pleasant patient.   Time Total: 25  Visit consisted of counseling and education dealing with the complex and emotionally intense issues of symptom management in the setting of serious illness.Greater than 50%   of this time was spent counseling and coordinating care related to the above assessment and plan.  Signed by: Nelwyn Salisbury, PA-C

## 2022-01-23 ENCOUNTER — Other Ambulatory Visit: Payer: Self-pay

## 2022-01-25 ENCOUNTER — Ambulatory Visit: Payer: PPO

## 2022-01-25 ENCOUNTER — Other Ambulatory Visit: Payer: PPO

## 2022-01-29 ENCOUNTER — Inpatient Hospital Stay (HOSPITAL_BASED_OUTPATIENT_CLINIC_OR_DEPARTMENT_OTHER): Payer: PPO | Admitting: Internal Medicine

## 2022-01-29 ENCOUNTER — Inpatient Hospital Stay: Payer: PPO

## 2022-01-29 ENCOUNTER — Encounter: Payer: Self-pay | Admitting: Internal Medicine

## 2022-01-29 VITALS — BP 146/85 | HR 99 | Temp 98.0°F | Resp 18 | Wt 149.4 lb

## 2022-01-29 DIAGNOSIS — C3490 Malignant neoplasm of unspecified part of unspecified bronchus or lung: Secondary | ICD-10-CM

## 2022-01-29 DIAGNOSIS — Z5111 Encounter for antineoplastic chemotherapy: Secondary | ICD-10-CM | POA: Diagnosis not present

## 2022-01-29 DIAGNOSIS — D649 Anemia, unspecified: Secondary | ICD-10-CM

## 2022-01-29 LAB — CBC WITH DIFFERENTIAL/PLATELET
Abs Immature Granulocytes: 0.21 10*3/uL — ABNORMAL HIGH (ref 0.00–0.07)
Basophils Absolute: 0 10*3/uL (ref 0.0–0.1)
Basophils Relative: 1 %
Eosinophils Absolute: 0 10*3/uL (ref 0.0–0.5)
Eosinophils Relative: 0 %
HCT: 24.3 % — ABNORMAL LOW (ref 36.0–46.0)
Hemoglobin: 8.3 g/dL — ABNORMAL LOW (ref 12.0–15.0)
Immature Granulocytes: 5 %
Lymphocytes Relative: 16 %
Lymphs Abs: 0.7 10*3/uL (ref 0.7–4.0)
MCH: 31.6 pg (ref 26.0–34.0)
MCHC: 34.2 g/dL (ref 30.0–36.0)
MCV: 92.4 fL (ref 80.0–100.0)
Monocytes Absolute: 0.6 10*3/uL (ref 0.1–1.0)
Monocytes Relative: 13 %
Neutro Abs: 2.8 10*3/uL (ref 1.7–7.7)
Neutrophils Relative %: 65 %
Platelets: 315 10*3/uL (ref 150–400)
RBC: 2.63 MIL/uL — ABNORMAL LOW (ref 3.87–5.11)
RDW: 13.1 % (ref 11.5–15.5)
WBC: 4.3 10*3/uL (ref 4.0–10.5)
nRBC: 0.5 % — ABNORMAL HIGH (ref 0.0–0.2)

## 2022-01-29 LAB — IRON AND TIBC
Iron: 84 ug/dL (ref 28–170)
Saturation Ratios: 35 % — ABNORMAL HIGH (ref 10.4–31.8)
TIBC: 242 ug/dL — ABNORMAL LOW (ref 250–450)
UIBC: 158 ug/dL

## 2022-01-29 LAB — COMPREHENSIVE METABOLIC PANEL
ALT: 38 U/L (ref 0–44)
AST: 37 U/L (ref 15–41)
Albumin: 3.2 g/dL — ABNORMAL LOW (ref 3.5–5.0)
Alkaline Phosphatase: 117 U/L (ref 38–126)
Anion gap: 7 (ref 5–15)
BUN: 22 mg/dL (ref 8–23)
CO2: 22 mmol/L (ref 22–32)
Calcium: 8.4 mg/dL — ABNORMAL LOW (ref 8.9–10.3)
Chloride: 107 mmol/L (ref 98–111)
Creatinine, Ser: 1.45 mg/dL — ABNORMAL HIGH (ref 0.44–1.00)
GFR, Estimated: 39 mL/min — ABNORMAL LOW (ref >60)
Glucose, Bld: 131 mg/dL — ABNORMAL HIGH (ref 70–99)
Potassium: 3.8 mmol/L (ref 3.5–5.1)
Sodium: 136 mmol/L (ref 135–145)
Total Bilirubin: 0.4 mg/dL (ref 0.3–1.2)
Total Protein: 7 g/dL (ref 6.5–8.1)

## 2022-01-29 LAB — FERRITIN: Ferritin: 800 ng/mL — ABNORMAL HIGH (ref 11–307)

## 2022-01-29 MED ORDER — DARBEPOETIN ALFA 200 MCG/0.4ML IJ SOSY
200.0000 ug | PREFILLED_SYRINGE | Freq: Once | INTRAMUSCULAR | Status: AC
  Administered 2022-01-29: 200 ug via SUBCUTANEOUS
  Filled 2022-01-29: qty 0.4

## 2022-01-29 MED ORDER — SODIUM CHLORIDE 0.9 % IV SOLN
312.0000 mg | Freq: Once | INTRAVENOUS | Status: AC
  Administered 2022-01-29: 310 mg via INTRAVENOUS
  Filled 2022-01-29: qty 31

## 2022-01-29 MED ORDER — SODIUM CHLORIDE 0.9% FLUSH
10.0000 mL | INTRAVENOUS | Status: DC | PRN
  Administered 2022-01-29: 10 mL via INTRAVENOUS
  Filled 2022-01-29: qty 10

## 2022-01-29 MED ORDER — PEGFILGRASTIM 6 MG/0.6ML ~~LOC~~ PSKT
6.0000 mg | PREFILLED_SYRINGE | Freq: Once | SUBCUTANEOUS | Status: AC
  Administered 2022-01-29: 6 mg via SUBCUTANEOUS
  Filled 2022-01-29: qty 0.6

## 2022-01-29 MED ORDER — SODIUM CHLORIDE 0.9 % IV SOLN
200.0000 mg | Freq: Once | INTRAVENOUS | Status: AC
  Administered 2022-01-29: 200 mg via INTRAVENOUS
  Filled 2022-01-29: qty 200

## 2022-01-29 MED ORDER — SODIUM CHLORIDE 0.9 % IV SOLN
Freq: Once | INTRAVENOUS | Status: AC
  Filled 2022-01-29: qty 250

## 2022-01-29 MED ORDER — PALONOSETRON HCL INJECTION 0.25 MG/5ML
0.2500 mg | Freq: Once | INTRAVENOUS | Status: AC
  Administered 2022-01-29: 0.25 mg via INTRAVENOUS
  Filled 2022-01-29: qty 5

## 2022-01-29 MED ORDER — SODIUM CHLORIDE 0.9 % IV SOLN
150.0000 mg | Freq: Once | INTRAVENOUS | Status: AC
  Administered 2022-01-29: 150 mg via INTRAVENOUS
  Filled 2022-01-29: qty 150

## 2022-01-29 MED ORDER — HEPARIN SOD (PORK) LOCK FLUSH 100 UNIT/ML IV SOLN
500.0000 [IU] | Freq: Once | INTRAVENOUS | Status: AC | PRN
  Filled 2022-01-29: qty 5

## 2022-01-29 MED ORDER — SODIUM CHLORIDE 0.9 % IV SOLN
10.0000 mg | Freq: Once | INTRAVENOUS | Status: AC
  Administered 2022-01-29: 10 mg via INTRAVENOUS
  Filled 2022-01-29: qty 10

## 2022-01-29 MED ORDER — SODIUM CHLORIDE 0.9 % IV SOLN
250.0000 mg/m2 | Freq: Once | INTRAVENOUS | Status: AC
  Administered 2022-01-29: 400 mg via INTRAVENOUS
  Filled 2022-01-29: qty 16

## 2022-01-29 MED ORDER — HEPARIN SOD (PORK) LOCK FLUSH 100 UNIT/ML IV SOLN
INTRAVENOUS | Status: AC
  Administered 2022-01-29: 500 [IU]
  Filled 2022-01-29: qty 5

## 2022-01-29 MED ORDER — CYANOCOBALAMIN 1000 MCG/ML IJ SOLN
1000.0000 ug | Freq: Once | INTRAMUSCULAR | Status: AC
  Administered 2022-01-29: 1000 ug via INTRAMUSCULAR
  Filled 2022-01-29: qty 1

## 2022-01-29 NOTE — Progress Notes (Signed)
CrCl ~39.  Per MD, reduce dose of Alimta 50% and carbo dose calculated for CrCl (todays dose only).

## 2022-01-29 NOTE — Patient Instructions (Signed)
With Neulasta Onpro, you can experience bone pain. If that happens, you can take Claritin 10 mg once daily as needed for up to 5 days.

## 2022-01-29 NOTE — Progress Notes (Signed)
[place] OFFICE PROGRESS NOTE  Patient Care Team: Earlie Server, MD as PCP - General (Oncology) Telford Nab, RN as Oncology Nurse Navigator  TREATMENT:  Right hip palliative RT 30 cGy completed 12/18/2021 Carboplatin, alimta and Keytruda started 12/18/2021  ASSESSMENT & PLAN:  # Primary lung adenocarcinoma ([place]), Stage IV, PDL1 1% -Diagnosed 11/19/2021 after transbronchial biopsy of LUL nodule by Dr. Patsey Berthold.  Metastatic to pelvis, bilateral hips R>L and numerous pulmonary nodules. -MRI brain showed no intracranial mets. 1 cm indeterminate left frontal calvarium lesion. [facility] testing showed PD-L1 1%.  No other targetable mutations.  Report below  -Labs reviewed. CBC with differential WBC 4.3, hemoglobin 8.3 and platelets 315.  ANC 2.8.  CMP showed worsening creatinine of 1.45.  Ferritin 800.  Saturation 35%.  Due to worsening creatinine, we will adjust the dose of carboplatin and decrease pemetrexed by 50%.  Continue with same dose of Keytruda.  Add 500 mL normal saline.  With cycle #2, patient had ANC nadir 0.8.  She was given prophylactic Levaquin which she took for 5 days.  Will add Neulasta onpro with current cycle.  In 1 week she will come back for hydration 1 L normal saline.  -Sought second opinion with Dr. Varney Biles at [facility] who agreed with our treatment plan.  He has sent out lung fusion panel which we will follow-up.  # Normocytic anemia  -Likely secondary to chemotherapy.  Hemoglobin today 8.3.  Iron panel consistent with anemia of chronic disease. - will add Aranesp 200 mcg with every cycle.  We will increase the dose depending on hemoglobin response.  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 Nondisplaced pathologic fracture of the quadrilateral plate of the right acetabulum.  -Completed 10 sessions of RT total 30 Gy with Dr. Donella Stade on 12/18/2021 -Her pain is significantly improved. -Was seen by [facility].  No surgical intervention needed.  # Dry Cough  -Improved -She is using Flonase and Claritin which is not helping.   -She is using [employer] as needed.  #Mild difficulty swallowing -Reports mild difficulty with swallowing of solid food started soon after chemotherapy.  Denies any pain. -Stable.  No nausea or vomiting. -Discussed we can continue to monitor for now.  # Access - port  RTC in 3 weeks for MD visit, cycle 4 carbo Alimta Keytruda and labs. RTC in 1 week for hydration and labs.   Orders Placed This Encounter  Procedures   CBC with Differential    Standing Status:   Future    Standing Expiration Date:   02/20/2023   Comprehensive metabolic panel    Standing Status:   Future    Standing Expiration Date:   02/20/2023   CBC with Differential    Standing Status:   Future    Standing Expiration Date:   16/04/958   Basic metabolic panel    Standing Status:   Future    Standing Expiration Date:   01/29/2023   [facility]- 12/06/2021   All questions were answered. The patient knows to call the clinic with any problems, questions or concerns. The total time spent in the appointment was 30 minutes encounter with patients including review of chart and various tests results,  discussions about plan of care and coordination of care plan   Jane Canary, MD 01/29/2022 9:45 AM  INTERVAL HISTORY: Please see below for problem oriented charting.  Patient following with [place] for treatment of stage IV lung adenocarcinoma. Patient seen today accompanied by her daughter prior to cycle 3 of chemoimmunotherapy. Her appetite is fair.  She thinks she can do better with hydration.  She did receive IV fluids midcycle last time and felt better.  She  reports mild difficulty with swallowing of solid foods which started soon after chemotherapy.  Denies any nausea, vomiting, fever or chills.  No issues with swallowing liquids.  She continues to have dry cough but noticed some improvement with Tessalon Perles.  REVIEW OF SYSTEMS:   Positive ROS as above.  Rest 10 points ROS negative   I have reviewed the past medical history, past surgical history, social history and family history with the patient and they are unchanged from previous note.  ALLERGIES:  is allergic to diclofenac, lisinopril, and sulfa antibiotics.  MEDICATIONS:  Current Outpatient Medications  Medication Sig Dispense Refill   albuterol (VENTOLIN HFA) 108 (90 Base) MCG/ACT inhaler Inhale 2 puffs into the lungs every 6 (six) hours as needed for wheezing or shortness of breath. 18 g 0   benzonatate (TESSALON) 200 MG capsule Take 1 capsule (200 mg total) by mouth 3 (three) times daily as needed for cough. 30 capsule 0   dexamethasone (DECADRON) 4 MG tablet Starting next cycle, take 4 mg two times a day, a day prior to chemotherapy. Do not take on the day of chemotherapy. Then take for one day after chemotherapy. 20 tablet 0   fluticasone (FLONASE) 50 MCG/ACT nasal spray USE TWO SPRAYS IN EACH NOSTRIL ONCE DAILY prn 16 g 12   folic acid (FOLVITE) 1 MG tablet Take 1 tablet (1 mg total) by mouth daily. Start 7 days before pemetrexed chemotherapy. Continue until 21 days after pemetrexed completed. 100 tablet 3   lidocaine-prilocaine (EMLA) cream Apply to port site 30 mins prior using once 30 g 3   loratadine (CLARITIN) 10 MG tablet TAKE ONE TABLET BY MOUTH DAILY AS NEEDED FOR ALLERGIES 90 tablet 3   losartan (COZAAR) 100 MG tablet Take 1 tablet (100 mg total) by mouth daily. 90 tablet 3   Multiple Vitamin (ONE-DAILY MULTI-VITAMIN PO) Take by mouth daily.     nebivolol (BYSTOLIC) 2.5 MG tablet Take 1 tablet (2.5 mg total) by mouth daily. 90 tablet 3   ondansetron (ZOFRAN) 8 MG tablet  Take 1 tablet (8 mg total) by mouth every 8 (eight) hours as needed for nausea or vomiting. Start on the third day after carboplatin. 30 tablet 1   Chromium Picolinate (CHROMIUM PICOLATE PO) Take by mouth daily. (Patient not taking: Reported on 01/21/2022)     Omega-3 Fatty Acids (FISH OIL) 1000 MG CAPS Take by mouth. (Patient not taking: Reported on 01/21/2022)     omeprazole (PRILOSEC) 20 MG capsule Take 1 capsule (20 mg total) by mouth daily. (Patient not taking: Reported on 01/21/2022) 10 capsule 0   prochlorperazine (COMPAZINE) 10 MG tablet Take 1 tablet (10 mg total) by mouth every 6 (six) hours as needed for nausea or vomiting. (Patient not taking: Reported on 12/18/2021) 30 tablet 1   Turmeric (QC TUMERIC COMPLEX PO) Take 2 tablets by mouth daily. (Patient not taking: Reported on 11/28/2021)     No current facility-administered medications for this visit.   Facility-Administered Medications Ordered in Other Visits  Medication  Dose Route Frequency Provider Last Rate Last Admin   heparin lock flush 100 UNIT/ML injection            sodium chloride flush (NS) 0.9 % injection 10 mL  10 mL Intravenous PRN Jane Canary, MD   10 mL at 01/29/22 0836    SUMMARY OF ONCOLOGIC HISTORY: Oncology History  Primary lung adenocarcinoma ([place])  10/10/2021 Initial Diagnosis   Primary lung adenocarcinoma ([place]) Stage IV  Patient seen by PCP for wheezing for 2 weeks. Imaging with CXR followed by CT chest showed Spiculated 2.1 x 2.2 cm left upper lobe pulmonary nodule with associated pleural tethering. Innumerable subcentimeter pulmonary nodules throughout the lungs.      11/14/2021 PET scan   IMPRESSION: 1. Left suprahilar nodule with maximum SUV 4.5, and a more distal 2.2 cm left upper lobe nodule with maximum SUV of 5.5. 2. Innumerable scattered small pulmonary nodules, most of which are under 5 mm in diameter, throughout both lungs. Some are cavitary. Possibilities include cavitating hematogenous  disseminated malignancy versus infectious etiology subset as septic emboli. Most of the nodules are stable, some are minimally enlarged compared to 10/10/2021. 3. Abnormal mild permeative type bony findings in the right hemipelvis associated with substantially accentuated metabolic activity. Possible associated pathologic fracture through the right quadrilateral plate and right acetabulum. The findings involve the right ilium and ischium but also the right lateral sacral ala and a small focus in the left sacrum.    Pathology Results   She had transbronchial biopsies by Dr. Patsey Berthold on 11/19/21.  Pathology from LUL nodule showed adenocarcinoma, consistent with lung primary.    11/19/2021 Cancer Staging   Staging form: Lung, AJCC 8th Edition - Clinical stage from 11/19/2021: Stage IVB (cT1c, cM1c) - Signed by Jane Canary, MD on 11/27/2021 Stage prefix: Initial diagnosis   11/27/2021 Imaging   MRI pelvis  IMPRESSION: 1. Abnormal marrow signal throughout the right ilium involving the right acetabulum, right superior pubic ramus and right inferior pubic ramus, right side of the sacrum and a smaller focus of abnormal marrow lesion in the left side of the sacrum. Findings are concerning for metastatic disease. 2. Nondisplaced pathologic fracture of the quadrilateral plate of the right acetabulum. 3. Mild muscle edema in the right gluteus minimus and medius muscles likely reflecting mild muscle strain.  MRI brain  IMPRESSION: No evidence of intracranial metastatic disease.   Indeterminate 1 cm left frontal calvarium lesion.   12/18/2021 -  Chemotherapy   Patient is on Treatment Plan : LUNG Carboplatin (5) + Pemetrexed (500) + Pembrolizumab (200) D1 q21d Induction x 4 cycles / Maintenance Pemetrexed (500) + Pembrolizumab (200) D1 q21d       PHYSICAL EXAMINATION: ECOG PERFORMANCE STATUS: 2 - Symptomatic, <50% confined to bed  Vitals:   01/29/22 0851  BP: (!) 146/85  Pulse: 99   Resp: 18  Temp: 98 F (36.7 C)  SpO2: 100%    Filed Weights   01/29/22 0851  Weight: 149 lb 6.4 oz (67.8 kg)     Physical Exam Constitutional:      Appearance: Normal appearance.  HENT:     Head: Normocephalic and atraumatic.  Cardiovascular:     Rate and Rhythm: Normal rate.  Pulmonary:     Effort: Pulmonary effort is normal.  Musculoskeletal:     Right lower leg: No edema.     Left lower leg: No edema.  Skin:    General: Skin is warm.  Neurological:     General:  No focal deficit present.     Mental Status: She is oriented to person, place, and time.  Psychiatric:        Mood and Affect: Mood normal.     LABORATORY DATA:  I have reviewed the data as listed    Component Value Date/Time   NA 136 01/29/2022 0836   K 3.8 01/29/2022 0836   CL 107 01/29/2022 0836   CO2 22 01/29/2022 0836   GLUCOSE 131 (H) 01/29/2022 0836   BUN 22 01/29/2022 0836   CREATININE 1.45 (H) 01/29/2022 0836   CALCIUM 8.4 (L) 01/29/2022 0836   PROT 7.0 01/29/2022 0836   ALBUMIN 3.2 (L) 01/29/2022 0836   AST 37 01/29/2022 0836   ALT 38 01/29/2022 0836   ALKPHOS 117 01/29/2022 0836   BILITOT 0.4 01/29/2022 0836   GFRNONAA 39 (L) 01/29/2022 0836    No results found for: "SPEP", "UPEP"  Lab Results  Component Value Date   WBC 4.3 01/29/2022   NEUTROABS 2.8 01/29/2022   HGB 8.3 (L) 01/29/2022   HCT 24.3 (L) 01/29/2022   MCV 92.4 01/29/2022   PLT 315 01/29/2022      Chemistry      Component Value Date/Time   NA 136 01/29/2022 0836   K 3.8 01/29/2022 0836   CL 107 01/29/2022 0836   CO2 22 01/29/2022 0836   BUN 22 01/29/2022 0836   CREATININE 1.45 (H) 01/29/2022 0836      Component Value Date/Time   CALCIUM 8.4 (L) 01/29/2022 0836   ALKPHOS 117 01/29/2022 0836   AST 37 01/29/2022 0836   ALT 38 01/29/2022 0836   BILITOT 0.4 01/29/2022 0836       RADIOGRAPHIC STUDIES: I have personally reviewed the radiological images as listed and agreed with the findings in the  report. No results found.

## 2022-01-29 NOTE — Patient Instructions (Signed)
[facility] CANCER CTR AT [place]  Discharge Instructions: Thank you for choosing [facility] to provide your oncology and hematology care.  If you have a lab appointment with the [facility], please go directly to the [facility] and check in at the registration area.  Wear comfortable clothing and clothing appropriate for easy access to any Portacath or PICC line.   We strive to give you quality time with your provider. You may need to reschedule your appointment if you arrive late (15 or more minutes).  Arriving late affects you and other patients whose appointments are after yours.  Also, if you miss three or more appointments without notifying the office, you may be dismissed from the clinic at the provider's discretion.      For prescription refill requests, have your pharmacy contact our office and allow 72 hours for refills to be completed.    Today you received the following chemotherapy and/or immunotherapy agents keytruda, alimta, carboplatin   To help prevent nausea and vomiting after your treatment, we encourage you to take your nausea medication as directed.  BELOW ARE SYMPTOMS THAT SHOULD BE REPORTED IMMEDIATELY: *FEVER GREATER THAN 100.4 F (38 C) OR HIGHER *CHILLS OR SWEATING *NAUSEA AND VOMITING THAT IS NOT CONTROLLED WITH YOUR NAUSEA MEDICATION *UNUSUAL SHORTNESS OF BREATH *UNUSUAL BRUISING OR BLEEDING *URINARY PROBLEMS (pain or burning when urinating, or frequent urination) *BOWEL PROBLEMS (unusual diarrhea, constipation, pain near the anus) TENDERNESS IN MOUTH AND THROAT WITH OR WITHOUT PRESENCE OF ULCERS (sore throat, sores in mouth, or a toothache) UNUSUAL RASH, SWELLING OR PAIN  UNUSUAL VAGINAL DISCHARGE OR ITCHING   Items with * indicate a potential emergency and should be followed up as soon as possible or go to the Emergency Department if any problems should occur.  Please show the CHEMOTHERAPY ALERT CARD or IMMUNOTHERAPY ALERT CARD  at check-in to the Emergency Department and triage nurse.  Should you have questions after your visit or need to cancel or reschedule your appointment, please contact [facility] CANCER [place] AT [place]  [phone] and follow the prompts.  Office hours are 8:00 a.m. to 4:30 p.m. Monday - Friday. Please note that voicemails left after 4:00 p.m. may not be returned until the following business day.  We are closed weekends and major holidays. You have access to a nurse at all times for urgent questions. Please call the main number to the clinic [phone] and follow the prompts.  For any non-urgent questions, you may also contact your provider using MyChart. We now offer e-Visits for anyone 12 and older to request care online for non-urgent symptoms. For details visit [email or web address].   Also download the MyChart app! Go to the app store, search "MyChart", open the app, select [facility], and log in with your MyChart username and password.  Masks are optional in the cancer centers. If you would like for your care team to wear a mask while they are taking care of you, please let them know. For doctor visits, patients may have with them one support person who is at least 70 years old. At this time, visitors are not allowed in the infusion area.

## 2022-01-29 NOTE — Progress Notes (Signed)
Patient here today for follow up regarding lung cancer. Patient denies pain today. Patient has completed Levaquin at this time, did not take last 2 doses due to itching. Patient reports appetite is fair, denies nausea and vomiting. Patient reports new difficulty swallowing.

## 2022-01-30 ENCOUNTER — Other Ambulatory Visit: Payer: Self-pay

## 2022-02-05 ENCOUNTER — Inpatient Hospital Stay: Payer: PPO

## 2022-02-05 VITALS — BP 118/73 | HR 95 | Temp 99.0°F | Resp 18

## 2022-02-05 DIAGNOSIS — Z5111 Encounter for antineoplastic chemotherapy: Secondary | ICD-10-CM | POA: Diagnosis not present

## 2022-02-05 DIAGNOSIS — C3490 Malignant neoplasm of unspecified part of unspecified bronchus or lung: Secondary | ICD-10-CM

## 2022-02-05 LAB — BASIC METABOLIC PANEL
Anion gap: 8 (ref 5–15)
BUN: 20 mg/dL (ref 8–23)
CO2: 20 mmol/L — ABNORMAL LOW (ref 22–32)
Calcium: 8.6 mg/dL — ABNORMAL LOW (ref 8.9–10.3)
Chloride: 111 mmol/L (ref 98–111)
Creatinine, Ser: 1.24 mg/dL — ABNORMAL HIGH (ref 0.44–1.00)
GFR, Estimated: 47 mL/min — ABNORMAL LOW (ref >60)
Glucose, Bld: 129 mg/dL — ABNORMAL HIGH (ref 70–99)
Potassium: 3.6 mmol/L (ref 3.5–5.1)
Sodium: 139 mmol/L (ref 135–145)

## 2022-02-05 LAB — CBC WITH DIFFERENTIAL/PLATELET
Abs Immature Granulocytes: 0.12 10*3/uL — ABNORMAL HIGH (ref 0.00–0.07)
Basophils Absolute: 0 10*3/uL (ref 0.0–0.1)
Basophils Relative: 1 %
Eosinophils Absolute: 0 10*3/uL (ref 0.0–0.5)
Eosinophils Relative: 0 %
HCT: 23.3 % — ABNORMAL LOW (ref 36.0–46.0)
Hemoglobin: 8 g/dL — ABNORMAL LOW (ref 12.0–15.0)
Immature Granulocytes: 3 %
Lymphocytes Relative: 12 %
Lymphs Abs: 0.5 10*3/uL — ABNORMAL LOW (ref 0.7–4.0)
MCH: 32 pg (ref 26.0–34.0)
MCHC: 34.3 g/dL (ref 30.0–36.0)
MCV: 93.2 fL (ref 80.0–100.0)
Monocytes Absolute: 0.2 10*3/uL (ref 0.1–1.0)
Monocytes Relative: 6 %
Neutro Abs: 2.9 10*3/uL (ref 1.7–7.7)
Neutrophils Relative %: 78 %
Platelets: 113 10*3/uL — ABNORMAL LOW (ref 150–400)
RBC: 2.5 MIL/uL — ABNORMAL LOW (ref 3.87–5.11)
RDW: 13 % (ref 11.5–15.5)
Smear Review: NORMAL
WBC: 3.8 10*3/uL — ABNORMAL LOW (ref 4.0–10.5)
nRBC: 0 % (ref 0.0–0.2)

## 2022-02-05 MED ORDER — SODIUM CHLORIDE 0.9 % IV SOLN
Freq: Once | INTRAVENOUS | Status: AC
  Filled 2022-02-05: qty 250

## 2022-02-05 MED ORDER — SODIUM CHLORIDE 0.9% FLUSH
10.0000 mL | Freq: Once | INTRAVENOUS | Status: AC | PRN
  Administered 2022-02-05: 10 mL
  Filled 2022-02-05: qty 10

## 2022-02-05 MED ORDER — HEPARIN SOD (PORK) LOCK FLUSH 100 UNIT/ML IV SOLN
500.0000 [IU] | Freq: Once | INTRAVENOUS | Status: AC | PRN
  Administered 2022-02-05: 500 [IU]
  Filled 2022-02-05: qty 5

## 2022-02-15 ENCOUNTER — Inpatient Hospital Stay: Payer: PPO

## 2022-02-15 ENCOUNTER — Other Ambulatory Visit: Payer: PPO

## 2022-02-15 NOTE — Progress Notes (Signed)
Nutrition Assessment   Reason for Assessment:  Poor appetite   ASSESSMENT:  71 year old female with stage IV lung cancer.  Past medical history of HTN, pre-DM.  Patient completed palliative radiation to right hip on 12/18/21.  Patient receiving carboplatin, pemetrexed and keytruda.   Spoke with patient via phone.  Patient reports that her appetite is been decreased for especially that last few weeks.  Reports indigestion.  Typical day is egg and toast or cereal or grits and bacon for breakfast or premier protein shake.  Lunch is sandwich or soup.  Supper is leftover from lunch or meat and couple sides.  Snacks on almonds, cheese and crackers, cheezits, peanut butter and crackers.  Denies nausea.  Some constipation but taking miralax and senokot.  Says that she has some tightness when swallowing.  Denies coughing or getting choked with swallowing.  Denies trouble swallowing liquids.     Medications: decadron, folic acid, omega 3, claritin, MVI, prilosec.     Labs: glucose 129, creatinine 1.24, Calcium 8.6   Anthropometrics:   Height: 4'11" Weight: 149 lb on 10/10 UBW: 151 lb  (03/2021) per chart BMI: 30  Weight during treatment 145-150s, overall stable   Estimated Energy Needs  Kcals: 1700-2000 Protein: 85-100 g Fluid: 1700-2000 ml   NUTRITION DIAGNOSIS: Increased nutrient needs related to cancer as evidenced by estimated needs.   INTERVENTION:  Encouraged protein food at each meal Continue premier protein shakes as snacks Discussed chopping foods adding moisture to foods for ease of swallowing.  Could consider referral to SLP to evaluate swallowing.  Contact information provided   MONITORING, EVALUATION, GOAL: weight trends, intake   Next Visit: Friday, Nov 17 phone call  Matyas Baisley B. Zenia Resides, [place], [place] Registered Dietitian [phone]

## 2022-02-16 ENCOUNTER — Other Ambulatory Visit: Payer: Self-pay

## 2022-02-19 ENCOUNTER — Encounter: Payer: Self-pay | Admitting: Internal Medicine

## 2022-02-19 ENCOUNTER — Inpatient Hospital Stay: Payer: PPO

## 2022-02-19 ENCOUNTER — Inpatient Hospital Stay (HOSPITAL_BASED_OUTPATIENT_CLINIC_OR_DEPARTMENT_OTHER): Payer: PPO | Admitting: Internal Medicine

## 2022-02-19 VITALS — BP 143/85 | HR 107 | Temp 98.7°F | Resp 20 | Wt 143.6 lb

## 2022-02-19 DIAGNOSIS — D649 Anemia, unspecified: Secondary | ICD-10-CM | POA: Diagnosis not present

## 2022-02-19 DIAGNOSIS — C3490 Malignant neoplasm of unspecified part of unspecified bronchus or lung: Secondary | ICD-10-CM

## 2022-02-19 DIAGNOSIS — Z5111 Encounter for antineoplastic chemotherapy: Secondary | ICD-10-CM

## 2022-02-19 DIAGNOSIS — T451X5A Adverse effect of antineoplastic and immunosuppressive drugs, initial encounter: Secondary | ICD-10-CM

## 2022-02-19 DIAGNOSIS — D6481 Anemia due to antineoplastic chemotherapy: Secondary | ICD-10-CM

## 2022-02-19 DIAGNOSIS — Z5112 Encounter for antineoplastic immunotherapy: Secondary | ICD-10-CM

## 2022-02-19 LAB — COMPREHENSIVE METABOLIC PANEL
ALT: 21 U/L (ref 0–44)
AST: 29 U/L (ref 15–41)
Albumin: 3.5 g/dL (ref 3.5–5.0)
Alkaline Phosphatase: 134 U/L — ABNORMAL HIGH (ref 38–126)
Anion gap: 6 (ref 5–15)
BUN: 21 mg/dL (ref 8–23)
CO2: 19 mmol/L — ABNORMAL LOW (ref 22–32)
Calcium: 9.2 mg/dL (ref 8.9–10.3)
Chloride: 109 mmol/L (ref 98–111)
Creatinine, Ser: 1.21 mg/dL — ABNORMAL HIGH (ref 0.44–1.00)
GFR, Estimated: 48 mL/min — ABNORMAL LOW (ref >60)
Glucose, Bld: 137 mg/dL — ABNORMAL HIGH (ref 70–99)
Potassium: 4 mmol/L (ref 3.5–5.1)
Sodium: 134 mmol/L — ABNORMAL LOW (ref 135–145)
Total Bilirubin: 0.2 mg/dL — ABNORMAL LOW (ref 0.3–1.2)
Total Protein: 7.6 g/dL (ref 6.5–8.1)

## 2022-02-19 LAB — CBC WITH DIFFERENTIAL/PLATELET
Abs Immature Granulocytes: 0.41 10*3/uL — ABNORMAL HIGH (ref 0.00–0.07)
Basophils Absolute: 0 10*3/uL (ref 0.0–0.1)
Basophils Relative: 0 %
Eosinophils Absolute: 0 10*3/uL (ref 0.0–0.5)
Eosinophils Relative: 0 %
HCT: 22.9 % — ABNORMAL LOW (ref 36.0–46.0)
Hemoglobin: 7.5 g/dL — ABNORMAL LOW (ref 12.0–15.0)
Immature Granulocytes: 3 %
Lymphocytes Relative: 8 %
Lymphs Abs: 1.1 10*3/uL (ref 0.7–4.0)
MCH: 31.5 pg (ref 26.0–34.0)
MCHC: 32.8 g/dL (ref 30.0–36.0)
MCV: 96.2 fL (ref 80.0–100.0)
Monocytes Absolute: 0.9 10*3/uL (ref 0.1–1.0)
Monocytes Relative: 7 %
Neutro Abs: 11.6 10*3/uL — ABNORMAL HIGH (ref 1.7–7.7)
Neutrophils Relative %: 82 %
Platelets: 236 10*3/uL (ref 150–400)
RBC: 2.38 MIL/uL — ABNORMAL LOW (ref 3.87–5.11)
RDW: 14.5 % (ref 11.5–15.5)
WBC: 14.1 10*3/uL — ABNORMAL HIGH (ref 4.0–10.5)
nRBC: 0.4 % — ABNORMAL HIGH (ref 0.0–0.2)

## 2022-02-19 MED ORDER — SODIUM CHLORIDE 0.9 % IV SOLN
500.0000 mg/m2 | Freq: Once | INTRAVENOUS | Status: AC
  Administered 2022-02-19: 800 mg via INTRAVENOUS
  Filled 2022-02-19: qty 20

## 2022-02-19 MED ORDER — SODIUM CHLORIDE 0.9 % IV SOLN
200.0000 mg | Freq: Once | INTRAVENOUS | Status: AC
  Administered 2022-02-19: 200 mg via INTRAVENOUS
  Filled 2022-02-19: qty 8

## 2022-02-19 MED ORDER — SODIUM CHLORIDE 0.9 % IV SOLN
150.0000 mg | Freq: Once | INTRAVENOUS | Status: AC
  Administered 2022-02-19: 150 mg via INTRAVENOUS
  Filled 2022-02-19: qty 150

## 2022-02-19 MED ORDER — SODIUM CHLORIDE 0.9 % IV SOLN
10.0000 mg | Freq: Once | INTRAVENOUS | Status: AC
  Administered 2022-02-19: 10 mg via INTRAVENOUS
  Filled 2022-02-19: qty 10

## 2022-02-19 MED ORDER — DARBEPOETIN ALFA 300 MCG/0.6ML IJ SOSY
300.0000 ug | PREFILLED_SYRINGE | Freq: Once | INTRAMUSCULAR | Status: AC
  Administered 2022-02-19: 300 ug via SUBCUTANEOUS
  Filled 2022-02-19: qty 0.6

## 2022-02-19 MED ORDER — SODIUM CHLORIDE 0.9 % IV SOLN: 310.0000 mg | Freq: Once | INTRAVENOUS | Status: DC

## 2022-02-19 MED ORDER — SODIUM CHLORIDE 0.9 % IV SOLN
350.0000 mg | Freq: Once | INTRAVENOUS | Status: AC
  Administered 2022-02-19: 350 mg via INTRAVENOUS
  Filled 2022-02-19: qty 35

## 2022-02-19 MED ORDER — SODIUM CHLORIDE 0.9 % IV SOLN
Freq: Once | INTRAVENOUS | Status: AC
  Filled 2022-02-19: qty 250

## 2022-02-19 MED ORDER — PALONOSETRON HCL INJECTION 0.25 MG/5ML
0.2500 mg | Freq: Once | INTRAVENOUS | Status: AC
  Administered 2022-02-19: 0.25 mg via INTRAVENOUS
  Filled 2022-02-19: qty 5

## 2022-02-19 MED ORDER — PEGFILGRASTIM 6 MG/0.6ML ~~LOC~~ PSKT
6.0000 mg | PREFILLED_SYRINGE | Freq: Once | SUBCUTANEOUS | Status: AC
  Administered 2022-02-19: 6 mg via SUBCUTANEOUS
  Filled 2022-02-19: qty 0.6

## 2022-02-19 NOTE — Patient Instructions (Signed)
[facility] CANCER CTR AT [place]  Discharge Instructions: Thank you for choosing [facility] to provide your oncology and hematology care.  If you have a lab appointment with the [facility], please go directly to the [facility] and check in at the registration area.  Wear comfortable clothing and clothing appropriate for easy access to any Portacath or PICC line.   We strive to give you quality time with your provider. You may need to reschedule your appointment if you arrive late (15 or more minutes).  Arriving late affects you and other patients whose appointments are after yours.  Also, if you miss three or more appointments without notifying the office, you may be dismissed from the clinic at the provider's discretion.      For prescription refill requests, have your pharmacy contact our office and allow 72 hours for refills to be completed.    Today you received the following chemotherapy and/or immunotherapy agents: Keytruda, Alimta, Carboplatin      To help prevent nausea and vomiting after your treatment, we encourage you to take your nausea medication as directed.  BELOW ARE SYMPTOMS THAT SHOULD BE REPORTED IMMEDIATELY: *FEVER GREATER THAN 100.4 F (38 C) OR HIGHER *CHILLS OR SWEATING *NAUSEA AND VOMITING THAT IS NOT CONTROLLED WITH YOUR NAUSEA MEDICATION *UNUSUAL SHORTNESS OF BREATH *UNUSUAL BRUISING OR BLEEDING *URINARY PROBLEMS (pain or burning when urinating, or frequent urination) *BOWEL PROBLEMS (unusual diarrhea, constipation, pain near the anus) TENDERNESS IN MOUTH AND THROAT WITH OR WITHOUT PRESENCE OF ULCERS (sore throat, sores in mouth, or a toothache) UNUSUAL RASH, SWELLING OR PAIN  UNUSUAL VAGINAL DISCHARGE OR ITCHING   Items with * indicate a potential emergency and should be followed up as soon as possible or go to the Emergency Department if any problems should occur.  Please show the CHEMOTHERAPY ALERT CARD or IMMUNOTHERAPY ALERT  CARD at check-in to the Emergency Department and triage nurse.  Should you have questions after your visit or need to cancel or reschedule your appointment, please contact [facility] CANCER [place] AT [place]  [phone] and follow the prompts.  Office hours are 8:00 a.m. to 4:30 p.m. Monday - Friday. Please note that voicemails left after 4:00 p.m. may not be returned until the following business day.  We are closed weekends and major holidays. You have access to a nurse at all times for urgent questions. Please call the main number to the clinic [phone] and follow the prompts.  For any non-urgent questions, you may also contact your provider using MyChart. We now offer e-Visits for anyone 64 and older to request care online for non-urgent symptoms. For details visit [email or web address].   Also download the MyChart app! Go to the app store, search "MyChart", open the app, select , and log in with your MyChart username and password.  Masks are optional in the cancer centers. If you would like for your care team to wear a mask while they are taking care of you, please let them know. For doctor visits, patients may have with them one support person who is at least 70 years old. At this time, visitors are not allowed in the infusion area.

## 2022-02-19 NOTE — Progress Notes (Signed)
Confirmed with MD - wants full dose Alimta today.

## 2022-02-19 NOTE — Progress Notes (Signed)
[facility] OFFICE PROGRESS NOTE  Patient Care Team: Earlie Server, MD as PCP - General (Oncology) Telford Nab, RN as Oncology Nurse Navigator  TREATMENT:  Right hip palliative RT 30 cGy completed 12/18/2021 Carboplatin, alimta and Keytruda started 12/18/2021  ASSESSMENT & PLAN:  # Primary lung adenocarcinoma ([place]), Stage IV, PDL1 1% -Diagnosed 11/19/2021 after transbronchial biopsy of LUL nodule by Dr. Patsey Berthold.  Metastatic to pelvis, bilateral hips R>L and numerous pulmonary nodules. -MRI brain showed no intracranial mets. 1 cm indeterminate left frontal calvarium lesion. [place] 1 testing showed PD-L1 1%.  No other targetable mutations.  Report below -Labs reviewed.  Creatinine improved from 1.4-1.2 today.  Hemoglobin 7.5 noted.  WBC and platelets acceptable.  I will proceed with cycle 4 of carboplatin, Alimta and Keytruda at full dose today.  With next cycle I will drop carboplatin.  Patient will come back next Wednesday for hydration and labs. Add thyroid function tests today.   -Ordered CT chest, abdomen and pelvis to assess for treatment response.  -Sought second opinion with Dr. [facility]ney Biles at [facility] who agreed with our treatment plan.  He has sent out lung fusion panel which we will follow-up.  # Normocytic anemia  -Likely secondary to chemotherapy and iron panel consistent with anemia of chronic disease. -Hemoglobin continues to trend down.  7.5 today we will increase the dose of Aranesp to 300 mcg. I will drop carboplatin with next cycle.  Next week when patient comes back for hydration I will hold tube for blood bank in case she needs blood transfusion.  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  Nondisplaced pathologic fracture of the quadrilateral plate of the right acetabulum.  -Completed 10 sessions of RT total 30 Gy with Dr. Donella Stade on 12/18/2021 -Her pain is significantly improved. -Was seen by [facility].  No surgical intervention needed.  # Dry Cough  -Improved -She is using Flonase and Claritin which is not helping.   -She is using [employer] as needed.  #Mild difficulty swallowing -Reports mild difficulty with swallowing of solid food started soon after chemotherapy.  Denies any pain. -Stable.  No nausea or vomiting. -Discussed we can continue to monitor for now.  # Access - port  RTC in 1 week for hydration, labs, poss blood transfusion RTC in 3 weeks for MD visit, labs, cycle 5 Alimta and Keytruda, to discuss scans.   Orders Placed This Encounter  Procedures   CT CHEST WO CONTRAST    Standing Status:   Future    Standing Expiration Date:   02/20/2023    Order Specific Question:   Preferred imaging location?    Answer:   [facility]    Order Specific Question:   Radiology Contrast Protocol - do NOT remove file path    Answer:   [email or web address]   CT ABDOMEN PELVIS WO CONTRAST    Standing Status:   Future    Standing Expiration Date:   02/19/2023    Order Specific Question:   Preferred imaging location?    Answer:   [facility]    Order Specific Question:   Is Oral Contrast requested for this exam?    Answer:   No oral contrast    Order  Specific Question:   Reason for No Oral Contrast    Answer:   Other    Order Specific Question:   Please answer why no oral contrast is requested    Answer:   no required   CBC with Differential    Standing Status:   Future    Standing Expiration Date:   03/13/2023   Comprehensive metabolic panel    Standing Status:   Future    Standing Expiration Date:   03/13/2023   CBC with Differential    Standing Status:   Future    Standing Expiration Date:    04/03/2023   Comprehensive metabolic panel    Standing Status:   Future    Standing Expiration Date:   04/03/2023   CBC with Differential    Standing Status:   Future    Standing Expiration Date:   04/24/2023   Comprehensive metabolic panel    Standing Status:   Future    Standing Expiration Date:   04/24/2023   CBC with Differential/Platelet    Standing Status:   Future    Standing Expiration Date:   42/70/6237   Basic metabolic panel    Standing Status:   Future    Standing Expiration Date:   02/19/2023   Vitamin B12    Standing Status:   Future    Standing Expiration Date:   02/20/2023   Hold Tube- Blood Bank    Standing Status:   Future    Standing Expiration Date:   02/20/2023   [facility]- 12/06/2021   All questions were answered. The patient knows to call the clinic with any problems, questions or concerns. The total time spent in the appointment was 35 minutes encounter with patients including review of chart and various tests results, discussions about plan of care and coordination of care plan   Jane Canary, MD 02/19/2022 10:13 AM  INTERVAL HISTORY: Please see below for problem oriented charting.  Patient following with [place] for treatment of stage IV lung adenocarcinoma. Patient seen today accompanied by her daughter prior to cycle 4 of chemoimmunotherapy. Patient continues to tolerate chemotherapy reasonably well.  Denies any fever, chills, nausea, vomiting, shortness of breath.  She has intermittent difficulty with swallowing solids.  Denies any diarrhea or abdominal pain.  REVIEW OF SYSTEMS:   Positive ROS as above.  Rest 10 points ROS negative   I have reviewed the past medical history, past surgical history, social history and family history with the patient and they are unchanged from previous note.  ALLERGIES:  is allergic to diclofenac, lisinopril, and sulfa antibiotics.  MEDICATIONS:  Current Outpatient Medications  Medication Sig Dispense Refill    albuterol (VENTOLIN HFA) 108 (90 Base) MCG/ACT inhaler Inhale 2 puffs into the lungs every 6 (six) hours as needed for wheezing or shortness of breath. 18 g 0   benzonatate (TESSALON) 200 MG capsule Take 1 capsule (200 mg total) by mouth 3 (three) times daily as needed for cough. 30 capsule 0   dexamethasone (DECADRON) 4 MG tablet Starting next cycle, take 4 mg two times a day, a day prior to chemotherapy. Do not take on the day of chemotherapy. Then take for one day after chemotherapy. 20 tablet 0   fluticasone (FLONASE) 50 MCG/ACT nasal spray USE TWO SPRAYS IN EACH NOSTRIL ONCE DAILY prn 16 g 12   folic acid (FOLVITE) 1 MG tablet Take 1 tablet (1 mg total) by mouth daily. Start 7 days before pemetrexed chemotherapy. Continue until 79  days after pemetrexed completed. 100 tablet 3   lidocaine-prilocaine (EMLA) cream Apply to port site 30 mins prior using once 30 g 3   loratadine (CLARITIN) 10 MG tablet TAKE ONE TABLET BY MOUTH DAILY AS NEEDED FOR ALLERGIES 90 tablet 3   losartan (COZAAR) 100 MG tablet Take 1 tablet (100 mg total) by mouth daily. 90 tablet 3   Multiple Vitamin (ONE-DAILY MULTI-VITAMIN PO) Take by mouth daily.     nebivolol (BYSTOLIC) 2.5 MG tablet Take 1 tablet (2.5 mg total) by mouth daily. 90 tablet 3   ondansetron (ZOFRAN) 8 MG tablet Take 1 tablet (8 mg total) by mouth every 8 (eight) hours as needed for nausea or vomiting. Start on the third day after carboplatin. 30 tablet 1   Chromium Picolinate (CHROMIUM PICOLATE PO) Take by mouth daily. (Patient not taking: Reported on 01/21/2022)     Omega-3 Fatty Acids (FISH OIL) 1000 MG CAPS Take by mouth. (Patient not taking: Reported on 01/21/2022)     omeprazole (PRILOSEC) 20 MG capsule Take 1 capsule (20 mg total) by mouth daily. (Patient not taking: Reported on 01/21/2022) 10 capsule 0   prochlorperazine (COMPAZINE) 10 MG tablet Take 1 tablet (10 mg total) by mouth every 6 (six) hours as needed for nausea or vomiting. (Patient not taking:  Reported on 12/18/2021) 30 tablet 1   Turmeric (QC TUMERIC COMPLEX PO) Take 2 tablets by mouth daily. (Patient not taking: Reported on 11/28/2021)     No current facility-administered medications for this visit.   Facility-Administered Medications Ordered in Other Visits  Medication Dose Route Frequency Provider Last Rate Last Admin   CARBOplatin (PARAPLATIN) 350 mg in sodium chloride 0.9 % 250 mL chemo infusion  350 mg Intravenous Once Jane Canary, MD       Darbepoetin Alfa (ARANESP) injection 300 mcg  300 mcg Subcutaneous Once Jane Canary, MD       dexamethasone (DECADRON) 10 mg in sodium chloride 0.9 % 50 mL IVPB  10 mg Intravenous Once Jane Canary, MD 204 mL/hr at 02/19/22 1008 10 mg at 02/19/22 1008   fosaprepitant (EMEND) 150 mg in sodium chloride 0.9 % 145 mL IVPB  150 mg Intravenous Once Jane Canary, MD       heparin lock flush 100 UNIT/ML injection            pegfilgrastim (NEULASTA ONPRO KIT) injection 6 mg  6 mg Subcutaneous Once Jane Canary, MD       pembrolizumab [facility]) 200 mg in sodium chloride 0.9 % 50 mL chemo infusion  200 mg Intravenous Once Jane Canary, MD       PEMEtrexed (ALIMTA) 800 mg in sodium chloride 0.9 % 100 mL chemo infusion  500 mg/m2 (Treatment Plan Recorded) Intravenous Once Jane Canary, MD        SUMMARY OF ONCOLOGIC HISTORY: Oncology History  Primary lung adenocarcinoma ([place])  10/10/2021 Initial Diagnosis   Primary lung adenocarcinoma ([place]) Stage IV  Patient seen by PCP for wheezing for 2 weeks. Imaging with CXR followed by CT chest showed Spiculated 2.1 x 2.2 cm left upper lobe pulmonary nodule with associated pleural tethering. Innumerable subcentimeter pulmonary nodules throughout the lungs.      11/14/2021 PET scan   IMPRESSION: 1. Left suprahilar nodule with maximum SUV 4.5, and a more distal 2.2 cm left upper lobe nodule with maximum SUV of 5.5. 2. Innumerable scattered small pulmonary nodules, most of which are under  5 mm in diameter, throughout both lungs. Some are cavitary. Possibilities  include cavitating hematogenous disseminated malignancy versus infectious etiology subset as septic emboli. Most of the nodules are stable, some are minimally enlarged compared to 10/10/2021. 3. Abnormal mild permeative type bony findings in the right hemipelvis associated with substantially accentuated metabolic activity. Possible associated pathologic fracture through the right quadrilateral plate and right acetabulum. The findings involve the right ilium and ischium but also the right lateral sacral ala and a small focus in the left sacrum.    Pathology Results   She had transbronchial biopsies by Dr. Patsey Berthold on 11/19/21.  Pathology from LUL nodule showed adenocarcinoma, consistent with lung primary.    11/19/2021 Cancer Staging   Staging form: Lung, AJCC 8th Edition - Clinical stage from 11/19/2021: Stage IVB (cT1c, cM1c) - Signed by Jane Canary, MD on 11/27/2021 Stage prefix: Initial diagnosis   11/27/2021 Imaging   MRI pelvis  IMPRESSION: 1. Abnormal marrow signal throughout the right ilium involving the right acetabulum, right superior pubic ramus and right inferior pubic ramus, right side of the sacrum and a smaller focus of abnormal marrow lesion in the left side of the sacrum. Findings are concerning for metastatic disease. 2. Nondisplaced pathologic fracture of the quadrilateral plate of the right acetabulum. 3. Mild muscle edema in the right gluteus minimus and medius muscles likely reflecting mild muscle strain.  MRI brain  IMPRESSION: No evidence of intracranial metastatic disease.   Indeterminate 1 cm left frontal calvarium lesion.   12/18/2021 -  Chemotherapy   Patient is on Treatment Plan : LUNG Carboplatin (5) + Pemetrexed (500) + Pembrolizumab (200) D1 q21d Induction x 4 cycles / Maintenance Pemetrexed (500) + Pembrolizumab (200) D1 q21d       PHYSICAL EXAMINATION: ECOG PERFORMANCE  STATUS: 2 - Symptomatic, <50% confined to bed  Vitals:   02/19/22 0923  BP: (!) 143/85  Pulse: (!) 107  Resp: 20  Temp: 98.7 F (37.1 C)  SpO2: 100%    Filed Weights   02/19/22 0923  Weight: 143 lb 9.6 oz (65.1 kg)     Physical Exam Constitutional:      Appearance: Normal appearance.  HENT:     Head: Normocephalic and atraumatic.  Cardiovascular:     Rate and Rhythm: Normal rate.  Pulmonary:     Effort: Pulmonary effort is normal.  Musculoskeletal:     Right lower leg: No edema.     Left lower leg: No edema.  Skin:    General: Skin is warm.  Neurological:     General: No focal deficit present.     Mental Status: She is oriented to person, place, and time.  Psychiatric:        Mood and Affect: Mood normal.     LABORATORY DATA:  I have reviewed the data as listed    Component Value Date/Time   NA 134 (L) 02/19/2022 0904   K 4.0 02/19/2022 0904   CL 109 02/19/2022 0904   CO2 19 (L) 02/19/2022 0904   GLUCOSE 137 (H) 02/19/2022 0904   BUN 21 02/19/2022 0904   CREATININE 1.21 (H) 02/19/2022 0904   CALCIUM 9.2 02/19/2022 0904   PROT 7.6 02/19/2022 0904   ALBUMIN 3.5 02/19/2022 0904   AST 29 02/19/2022 0904   ALT 21 02/19/2022 0904   ALKPHOS 134 (H) 02/19/2022 0904   BILITOT 0.2 (L) 02/19/2022 0904   GFRNONAA 48 (L) 02/19/2022 0904    No results found for: "SPEP", "UPEP"  Lab Results  Component Value Date   WBC 14.1 (H) 02/19/2022  NEUTROABS 11.6 (H) 02/19/2022   HGB 7.5 (L) 02/19/2022   HCT 22.9 (L) 02/19/2022   MCV 96.2 02/19/2022   PLT 236 02/19/2022      Chemistry      Component Value Date/Time   NA 134 (L) 02/19/2022 0904   K 4.0 02/19/2022 0904   CL 109 02/19/2022 0904   CO2 19 (L) 02/19/2022 0904   BUN 21 02/19/2022 0904   CREATININE 1.21 (H) 02/19/2022 0904      Component Value Date/Time   CALCIUM 9.2 02/19/2022 0904   ALKPHOS 134 (H) 02/19/2022 0904   AST 29 02/19/2022 0904   ALT 21 02/19/2022 0904   BILITOT 0.2 (L) 02/19/2022  0904       RADIOGRAPHIC STUDIES: I have personally reviewed the radiological images as listed and agreed with the findings in the report. No results found.

## 2022-02-20 ENCOUNTER — Other Ambulatory Visit: Payer: Self-pay

## 2022-02-20 LAB — THYROID PANEL WITH TSH
Free Thyroxine Index: 1.8 (ref 1.2–4.9)
T3 Uptake Ratio: 29 % (ref 24–39)
T4, Total: 6.3 ug/dL (ref 4.5–12.0)
TSH: 0.731 u[IU]/mL (ref 0.450–4.500)

## 2022-02-23 ENCOUNTER — Other Ambulatory Visit: Payer: Self-pay

## 2022-02-26 ENCOUNTER — Other Ambulatory Visit: Payer: Self-pay

## 2022-02-27 ENCOUNTER — Ambulatory Visit
Admission: RE | Admit: 2022-02-27 | Discharge: 2022-02-27 | Disposition: A | Discharge status: Home / Self Care | Payer: PPO | Source: Ambulatory Visit | Attending: Internal Medicine | Admitting: Internal Medicine

## 2022-02-27 ENCOUNTER — Other Ambulatory Visit: Payer: PPO

## 2022-02-27 ENCOUNTER — Ambulatory Visit (HOSPITAL_BASED_OUTPATIENT_CLINIC_OR_DEPARTMENT_OTHER): Payer: PPO | Admitting: Medical Oncology

## 2022-02-27 ENCOUNTER — Inpatient Hospital Stay: Payer: PPO | Attending: Internal Medicine

## 2022-02-27 ENCOUNTER — Inpatient Hospital Stay: Payer: PPO

## 2022-02-27 ENCOUNTER — Other Ambulatory Visit: Payer: Self-pay | Admitting: *Deleted

## 2022-02-27 VITALS — BP 102/65 | HR 105 | Temp 98.5°F | Resp 20

## 2022-02-27 DIAGNOSIS — D709 Neutropenia, unspecified: Secondary | ICD-10-CM

## 2022-02-27 DIAGNOSIS — Z5112 Encounter for antineoplastic immunotherapy: Secondary | ICD-10-CM | POA: Insufficient documentation

## 2022-02-27 DIAGNOSIS — D649 Anemia, unspecified: Secondary | ICD-10-CM

## 2022-02-27 DIAGNOSIS — Z1231 Encounter for screening mammogram for malignant neoplasm of breast: Secondary | ICD-10-CM | POA: Insufficient documentation

## 2022-02-27 DIAGNOSIS — C3490 Malignant neoplasm of unspecified part of unspecified bronchus or lung: Secondary | ICD-10-CM

## 2022-02-27 DIAGNOSIS — R300 Dysuria: Secondary | ICD-10-CM | POA: Diagnosis not present

## 2022-02-27 DIAGNOSIS — Z5111 Encounter for antineoplastic chemotherapy: Secondary | ICD-10-CM | POA: Diagnosis not present

## 2022-02-27 DIAGNOSIS — C3412 Malignant neoplasm of upper lobe, left bronchus or lung: Secondary | ICD-10-CM | POA: Insufficient documentation

## 2022-02-27 LAB — BASIC METABOLIC PANEL
Anion gap: 9 (ref 5–15)
BUN: 26 mg/dL — ABNORMAL HIGH (ref 8–23)
CO2: 18 mmol/L — ABNORMAL LOW (ref 22–32)
Calcium: 8.9 mg/dL (ref 8.9–10.3)
Chloride: 108 mmol/L (ref 98–111)
Creatinine, Ser: 1.41 mg/dL — ABNORMAL HIGH (ref 0.44–1.00)
GFR, Estimated: 40 mL/min — ABNORMAL LOW (ref >60)
Glucose, Bld: 115 mg/dL — ABNORMAL HIGH (ref 70–99)
Potassium: 4 mmol/L (ref 3.5–5.1)
Sodium: 135 mmol/L (ref 135–145)

## 2022-02-27 LAB — CBC WITH DIFFERENTIAL/PLATELET
Abs Immature Granulocytes: 0.1 10*3/uL — ABNORMAL HIGH (ref 0.00–0.07)
Basophils Absolute: 0 10*3/uL (ref 0.0–0.1)
Basophils Relative: 1 %
Eosinophils Absolute: 0 10*3/uL (ref 0.0–0.5)
Eosinophils Relative: 2 %
HCT: 19.3 % — ABNORMAL LOW (ref 36.0–46.0)
Hemoglobin: 6.4 g/dL — ABNORMAL LOW (ref 12.0–15.0)
Immature Granulocytes: 8 %
Lymphocytes Relative: 23 %
Lymphs Abs: 0.3 10*3/uL — ABNORMAL LOW (ref 0.7–4.0)
MCH: 32 pg (ref 26.0–34.0)
MCHC: 33.2 g/dL (ref 30.0–36.0)
MCV: 96.5 fL (ref 80.0–100.0)
Monocytes Absolute: 0.1 10*3/uL (ref 0.1–1.0)
Monocytes Relative: 8 %
Neutro Abs: 0.8 10*3/uL — ABNORMAL LOW (ref 1.7–7.7)
Neutrophils Relative %: 58 %
Platelets: 45 10*3/uL — ABNORMAL LOW (ref 150–400)
RBC: 2 MIL/uL — ABNORMAL LOW (ref 3.87–5.11)
RDW: 14.6 % (ref 11.5–15.5)
Smear Review: NORMAL
WBC: 1.3 10*3/uL — CL (ref 4.0–10.5)
nRBC: 0 % (ref 0.0–0.2)

## 2022-02-27 LAB — PREPARE RBC (CROSSMATCH)

## 2022-02-27 LAB — ABO/RH: ABO/RH(D): O NEG

## 2022-02-27 LAB — VITAMIN B12: Vitamin B-12: 3603 pg/mL — ABNORMAL HIGH (ref 180–914)

## 2022-02-27 MED ORDER — SODIUM CHLORIDE 0.9 % IV SOLN
Freq: Once | INTRAVENOUS | Status: AC
  Filled 2022-02-27: qty 250

## 2022-02-27 MED ORDER — LEVOFLOXACIN 500 MG PO TABS: 500.0000 mg | ORAL_TABLET | Freq: Every day | ORAL | 0 refills | Status: AC

## 2022-02-27 MED ORDER — SODIUM CHLORIDE 0.9% FLUSH
10.0000 mL | Freq: Once | INTRAVENOUS | Status: AC | PRN
  Administered 2022-02-27: 10 mL
  Filled 2022-02-27: qty 10

## 2022-02-27 MED ORDER — HEPARIN SOD (PORK) LOCK FLUSH 100 UNIT/ML IV SOLN
500.0000 [IU] | Freq: Once | INTRAVENOUS | Status: AC | PRN
  Administered 2022-02-27: 500 [IU]
  Filled 2022-02-27: qty 5

## 2022-02-27 NOTE — Progress Notes (Addendum)
Symptom Management [place] at [facility] Telephone:([phone]) [phone] Fax:([phone]) [phone]  Patient Care Team: Earlie Server, MD as PCP - General (Oncology) Telford Nab, RN as Oncology Nurse Navigator   Name of the patient: Victoria Foley  [id number]  07/08/1950   Date of visit: 02/27/22  Reason for Consult: Victoria Foley is a 71 y.o. female who presents today for:  Anemia: Patient is seen today in our [facility] for symptomatic anemia. She is currently s/p Cycle 4 of Carboplatin, altima and keytruda for her stage Iv lung cancer. Her last treatment was on 02/19/2022. Today she was here for fluids and found to have a critical WBC of 1.5, ANC of 0.8, platelet of 45 and Hgb of 6.4. She is afebrile. She reports that she is feeling more tired than normal. No chest pain, SOB, peripheral edema. No black stools, vomiting. Upon questioning she does mention possible scant dysuria.   Upon questioning she states that she has had a blood transfusion in the past when she was a teenager and did well with this- no side effects. She is agreeable if needed.    PAST MEDICAL HISTORY: Past Medical History:  Diagnosis Date   Anemia    Anxiety    Arthritis    COVID-19    03/2021   Hypertension    Pneumonia    Pre-diabetes    Prediabetes    Sciatica     PAST SURGICAL HISTORY:  Past Surgical History:  Procedure Laterality Date   CESAREAN SECTION     COLONOSCOPY W/ POLYPECTOMY     ECTOPIC PREGNANCY SURGERY     FEMUR SURGERY     left, s/p rod for fracture   IR IMAGING GUIDED PORT INSERTION  12/11/2021   TUBAL LIGATION      HEMATOLOGY/ONCOLOGY HISTORY:  Oncology History  Primary lung adenocarcinoma ([place])  10/10/2021 Initial Diagnosis   Primary lung adenocarcinoma ([place]) Stage IV  Patient seen by PCP for wheezing for 2 weeks. Imaging with CXR followed by CT chest showed Spiculated 2.1 x 2.2 cm left upper lobe pulmonary nodule with associated pleural tethering. Innumerable  subcentimeter pulmonary nodules throughout the lungs.      11/14/2021 PET scan   IMPRESSION: 1. Left suprahilar nodule with maximum SUV 4.5, and a more distal 2.2 cm left upper lobe nodule with maximum SUV of 5.5. 2. Innumerable scattered small pulmonary nodules, most of which are under 5 mm in diameter, throughout both lungs. Some are cavitary. Possibilities include cavitating hematogenous disseminated malignancy versus infectious etiology subset as septic emboli. Most of the nodules are stable, some are minimally enlarged compared to 10/10/2021. 3. Abnormal mild permeative type bony findings in the right hemipelvis associated with substantially accentuated metabolic activity. Possible associated pathologic fracture through the right quadrilateral plate and right acetabulum. The findings involve the right ilium and ischium but also the right lateral sacral ala and a small focus in the left sacrum.    Pathology Results   She had transbronchial biopsies by Dr. Patsey Berthold on 11/19/21.  Pathology from LUL nodule showed adenocarcinoma, consistent with lung primary.    11/19/2021 Cancer Staging   Staging form: Lung, AJCC 8th Edition - Clinical stage from 11/19/2021: Stage IVB (cT1c, cM1c) - Signed by Jane Canary, MD on 11/27/2021 Stage prefix: Initial diagnosis   11/27/2021 Imaging   MRI pelvis  IMPRESSION: 1. Abnormal marrow signal throughout the right ilium involving the right acetabulum, right superior pubic ramus and right inferior pubic ramus, right side of  the sacrum and a smaller focus of abnormal marrow lesion in the left side of the sacrum. Findings are concerning for metastatic disease. 2. Nondisplaced pathologic fracture of the quadrilateral plate of the right acetabulum. 3. Mild muscle edema in the right gluteus minimus and medius muscles likely reflecting mild muscle strain.  MRI brain  IMPRESSION: No evidence of intracranial metastatic disease.   Indeterminate 1 cm  left frontal calvarium lesion.   12/18/2021 -  Chemotherapy   Patient is on Treatment Plan : LUNG Carboplatin (5) + Pemetrexed (500) + Pembrolizumab (200) D1 q21d Induction x 4 cycles / Maintenance Pemetrexed (500) + Pembrolizumab (200) D1 q21d       ALLERGIES:  is allergic to diclofenac, lisinopril, and sulfa antibiotics.  MEDICATIONS:  Current Outpatient Medications  Medication Sig Dispense Refill   levofloxacin (LEVAQUIN) 500 MG tablet Take 1 tablet (500 mg total) by mouth daily for 7 days. 7 tablet 0   albuterol (VENTOLIN HFA) 108 (90 Base) MCG/ACT inhaler Inhale 2 puffs into the lungs every 6 (six) hours as needed for wheezing or shortness of breath. 18 g 0   benzonatate (TESSALON) 200 MG capsule Take 1 capsule (200 mg total) by mouth 3 (three) times daily as needed for cough. 30 capsule 0   Chromium Picolinate (CHROMIUM PICOLATE PO) Take by mouth daily. (Patient not taking: Reported on 01/21/2022)     dexamethasone (DECADRON) 4 MG tablet Starting next cycle, take 4 mg two times a day, a day prior to chemotherapy. Do not take on the day of chemotherapy. Then take for one day after chemotherapy. 20 tablet 0   fluticasone (FLONASE) 50 MCG/ACT nasal spray USE TWO SPRAYS IN EACH NOSTRIL ONCE DAILY prn 16 g 12   folic acid (FOLVITE) 1 MG tablet Take 1 tablet (1 mg total) by mouth daily. Start 7 days before pemetrexed chemotherapy. Continue until 21 days after pemetrexed completed. 100 tablet 3   lidocaine-prilocaine (EMLA) cream Apply to port site 30 mins prior using once 30 g 3   loratadine (CLARITIN) 10 MG tablet TAKE ONE TABLET BY MOUTH DAILY AS NEEDED FOR ALLERGIES 90 tablet 3   losartan (COZAAR) 100 MG tablet Take 1 tablet (100 mg total) by mouth daily. 90 tablet 3   Multiple Vitamin (ONE-DAILY MULTI-VITAMIN PO) Take by mouth daily.     nebivolol (BYSTOLIC) 2.5 MG tablet Take 1 tablet (2.5 mg total) by mouth daily. 90 tablet 3   Omega-3 Fatty Acids (FISH OIL) 1000 MG CAPS Take by mouth.  (Patient not taking: Reported on 01/21/2022)     omeprazole (PRILOSEC) 20 MG capsule Take 1 capsule (20 mg total) by mouth daily. (Patient not taking: Reported on 01/21/2022) 10 capsule 0   ondansetron (ZOFRAN) 8 MG tablet Take 1 tablet (8 mg total) by mouth every 8 (eight) hours as needed for nausea or vomiting. Start on the third day after carboplatin. 30 tablet 1   prochlorperazine (COMPAZINE) 10 MG tablet Take 1 tablet (10 mg total) by mouth every 6 (six) hours as needed for nausea or vomiting. (Patient not taking: Reported on 12/18/2021) 30 tablet 1   Turmeric (QC TUMERIC COMPLEX PO) Take 2 tablets by mouth daily. (Patient not taking: Reported on 11/28/2021)     No current facility-administered medications for this visit.   Facility-Administered Medications Ordered in Other Visits  Medication Dose Route Frequency Provider Last Rate Last Admin   heparin lock flush 100 UNIT/ML injection  VITAL SIGNS: There were no vitals taken for this visit. There were no vitals filed for this visit.  Estimated body mass index is 29 kg/m as calculated from the following:   Height as of 01/21/22: 4\' 11"  (1.499 m).   Weight as of 02/19/22: 143 lb 9.6 oz (65.1 kg).  LABS: CBC:    Component Value Date/Time   WBC 1.3 (LL) 02/27/2022 1408   HGB 6.4 (L) 02/27/2022 1408   HCT 19.3 (L) 02/27/2022 1408   PLT 45 (L) 02/27/2022 1408   MCV 96.5 02/27/2022 1408   NEUTROABS 0.8 (L) 02/27/2022 1408   LYMPHSABS 0.3 (L) 02/27/2022 1408   MONOABS 0.1 02/27/2022 1408   EOSABS 0.0 02/27/2022 1408   BASOSABS 0.0 02/27/2022 1408   Comprehensive Metabolic Panel:    Component Value Date/Time   NA 135 02/27/2022 1408   K 4.0 02/27/2022 1408   CL 108 02/27/2022 1408   CO2 18 (L) 02/27/2022 1408   BUN 26 (H) 02/27/2022 1408   CREATININE 1.41 (H) 02/27/2022 1408   GLUCOSE 115 (H) 02/27/2022 1408   CALCIUM 8.9 02/27/2022 1408   AST 29 02/19/2022 0904   ALT 21 02/19/2022 0904   ALKPHOS 134 (H) 02/19/2022  0904   BILITOT 0.2 (L) 02/19/2022 0904   PROT 7.6 02/19/2022 0904   ALBUMIN 3.5 02/19/2022 0904    RADIOGRAPHIC STUDIES: No results found.  PERFORMANCE STATUS (ECOG) : 1 - Symptomatic but completely ambulatory  Review of Systems Unless otherwise noted, a complete review of systems is negative.  Physical Exam General: NAD. Looks better than vitals/labs suggest  Cardiovascular: regular rate and rhythm Pulmonary: clear ant fields Abdomen: soft, nontender, + bowel sounds GU: no suprapubic tenderness Extremities: no edema, no joint deformities Skin: no rashes Neurological: Weakness but otherwise nonfocal  Assessment and Plan- Patient is a 71 y.o. female    Encounter Diagnoses  Name Primary?   Symptomatic anemia Yes   Neutropenia, unspecified type ([place])    Dysuria    New. Secondary to chemotherapy. Afebrile. She will need 2 units of PRBC as well as levaquin to cover for any infections prophylacticly. UA/Culture pending. Discussed and reviewed with patient. Also reviewed red flag signs and symptoms related to her anemia and neutropenia that would warrant ER visit between now and tomorrow. She will need close follow up. Alerting Dr. Darrall Dears to her neutropenia and anemia.   Disposition Levaquin to pharmacy Tomorrow 2 units PRBC RTC Friday for follow up [facility], labs (CBCw, CMP)    Patient expressed understanding and was in agreement with this plan. She also understands that She can call clinic at any time with any questions, concerns, or complaints.   Thank you for allowing me to participate in the care of this very pleasant patient.   Time Total: 25  Visit consisted of counseling and education dealing with the complex and emotionally intense issues of symptom management in the setting of serious illness.Greater than 50%  of this time was spent counseling and coordinating care related to the above assessment and plan.  Signed by: Nelwyn Salisbury, PA-C

## 2022-02-28 ENCOUNTER — Inpatient Hospital Stay: Payer: PPO

## 2022-02-28 ENCOUNTER — Other Ambulatory Visit: Payer: Self-pay

## 2022-02-28 ENCOUNTER — Ambulatory Visit: Payer: PPO

## 2022-02-28 DIAGNOSIS — Z5111 Encounter for antineoplastic chemotherapy: Secondary | ICD-10-CM | POA: Diagnosis not present

## 2022-02-28 DIAGNOSIS — D649 Anemia, unspecified: Secondary | ICD-10-CM

## 2022-02-28 DIAGNOSIS — R300 Dysuria: Secondary | ICD-10-CM

## 2022-02-28 LAB — URINALYSIS, COMPLETE (UACMP) WITH MICROSCOPIC
Bacteria, UA: NONE SEEN
Bilirubin Urine: NEGATIVE
Glucose, UA: NEGATIVE mg/dL
Hgb urine dipstick: NEGATIVE
Ketones, ur: NEGATIVE mg/dL
Leukocytes,Ua: NEGATIVE
Nitrite: NEGATIVE
Protein, ur: NEGATIVE mg/dL
Specific Gravity, Urine: 1.01 (ref 1.005–1.030)
Squamous Epithelial / HPF: NONE SEEN (ref 0–5)
pH: 6 (ref 5.0–8.0)

## 2022-02-28 MED ORDER — SODIUM CHLORIDE 0.9% IV SOLUTION
250.0000 mL | Freq: Once | INTRAVENOUS | Status: AC
  Administered 2022-02-28: 250 mL via INTRAVENOUS
  Filled 2022-02-28: qty 250

## 2022-02-28 MED ORDER — DIPHENHYDRAMINE HCL 50 MG/ML IJ SOLN
25.0000 mg | Freq: Once | INTRAMUSCULAR | Status: AC
  Administered 2022-02-28: 25 mg via INTRAVENOUS
  Filled 2022-02-28: qty 1

## 2022-02-28 MED ORDER — ACETAMINOPHEN 325 MG PO TABS
650.0000 mg | ORAL_TABLET | Freq: Once | ORAL | Status: AC
  Administered 2022-02-28: 650 mg via ORAL
  Filled 2022-02-28: qty 2

## 2022-02-28 MED ORDER — HEPARIN SOD (PORK) LOCK FLUSH 100 UNIT/ML IV SOLN
INTRAVENOUS | Status: AC
  Administered 2022-02-28: 500 [IU] via INTRAVENOUS
  Filled 2022-02-28: qty 5

## 2022-02-28 MED ORDER — HEPARIN SOD (PORK) LOCK FLUSH 100 UNIT/ML IV SOLN
500.0000 [IU] | Freq: Once | INTRAVENOUS | Status: AC
  Filled 2022-02-28: qty 5

## 2022-02-28 NOTE — Patient Instructions (Signed)
Blood Transfusion, Adult, Care After The following information offers guidance on how to care for yourself after your procedure. Your health care provider may also give you more specific instructions. If you have problems or questions, contact your health care provider. What can I expect after the procedure? After the procedure, it is common to have: Bruising and soreness where the IV was inserted. A headache. Follow these instructions at home: IV insertion site care     Follow instructions from your health care provider about how to take care of your IV insertion site. Make sure you: Wash your hands with soap and water for at least 20 seconds before and after you change your bandage (dressing). If soap and water are not available, use hand sanitizer. Change your dressing as told by your health care provider. Check your IV insertion site every day for signs of infection. Check for: Redness, swelling, or pain. Bleeding from the site. Warmth. Pus or a bad smell. General instructions Take over-the-counter and prescription medicines only as told by your health care provider. Rest as told by your health care provider. Return to your normal activities as told by your health care provider. Keep all follow-up visits. Lab tests may need to be done at certain periods to recheck your blood counts. Contact a health care provider if: You have itching or red, swollen areas of skin (hives). You have a fever or chills. You have pain in the head, back, or chest. You feel anxious or you feel weak after doing your normal activities. You have redness, swelling, warmth, or pain around the IV insertion site. You have blood coming from the IV insertion site that does not stop with pressure. You have pus or a bad smell coming from your IV insertion site. If you received your blood transfusion in an outpatient setting, you will be told whom to contact to report any reactions. Get help right away if: You  have symptoms of a serious allergic or immune system reaction, including: Trouble breathing or shortness of breath. Swelling of the face, feeling flushed, or widespread rash. Dark urine or blood in the urine. Fast heartbeat. These symptoms may be an emergency. Get help right away. Call 911. Do not wait to see if the symptoms will go away. Do not drive yourself to the hospital. Summary Bruising and soreness around the IV insertion site are common. Check your IV insertion site every day for signs of infection. Rest as told by your health care provider. Return to your normal activities as told by your health care provider. Get help right away for symptoms of a serious allergic or immune system reaction to the blood transfusion. This information is not intended to replace advice given to you by your health care provider. Make sure you discuss any questions you have with your health care provider. Document Revised: 07/06/2021 Document Reviewed: 07/06/2021 Elsevier Patient Education  2023 Elsevier Inc.  

## 2022-03-01 ENCOUNTER — Other Ambulatory Visit: Payer: Self-pay | Admitting: Oncology

## 2022-03-01 ENCOUNTER — Encounter: Payer: Self-pay | Admitting: Internal Medicine

## 2022-03-01 DIAGNOSIS — R928 Other abnormal and inconclusive findings on diagnostic imaging of breast: Secondary | ICD-10-CM

## 2022-03-01 DIAGNOSIS — R599 Enlarged lymph nodes, unspecified: Secondary | ICD-10-CM

## 2022-03-01 LAB — BPAM RBC
Blood Product Expiration Date: 202312102359
Blood Product Expiration Date: 202312102359
ISSUE DATE / TIME: 202311090959
ISSUE DATE / TIME: 202311091200
Unit Type and Rh: 5100
Unit Type and Rh: 5100

## 2022-03-01 LAB — TYPE AND SCREEN
ABO/RH(D): O NEG
Antibody Screen: NEGATIVE
Unit division: 0
Unit division: 0

## 2022-03-01 LAB — URINE CULTURE: Culture: <10000 — AB

## 2022-03-03 ENCOUNTER — Encounter: Payer: Self-pay | Admitting: Internal Medicine

## 2022-03-03 ENCOUNTER — Other Ambulatory Visit: Payer: Self-pay

## 2022-03-04 ENCOUNTER — Inpatient Hospital Stay: Payer: PPO

## 2022-03-04 ENCOUNTER — Other Ambulatory Visit: Payer: Self-pay

## 2022-03-04 VITALS — BP 149/77 | HR 93 | Temp 98.3°F | Resp 20

## 2022-03-04 DIAGNOSIS — C3490 Malignant neoplasm of unspecified part of unspecified bronchus or lung: Secondary | ICD-10-CM

## 2022-03-04 DIAGNOSIS — Z5111 Encounter for antineoplastic chemotherapy: Secondary | ICD-10-CM | POA: Diagnosis not present

## 2022-03-04 LAB — CBC WITH DIFFERENTIAL/PLATELET
Abs Immature Granulocytes: 0.8 10*3/uL — ABNORMAL HIGH (ref 0.00–0.07)
Basophils Absolute: 0 10*3/uL (ref 0.0–0.1)
Basophils Relative: 0 %
Eosinophils Absolute: 0 10*3/uL (ref 0.0–0.5)
Eosinophils Relative: 0 %
HCT: 29.3 % — ABNORMAL LOW (ref 36.0–46.0)
Hemoglobin: 9.8 g/dL — ABNORMAL LOW (ref 12.0–15.0)
Lymphocytes Relative: 9 %
Lymphs Abs: 0.9 10*3/uL (ref 0.7–4.0)
MCH: 30 pg (ref 26.0–34.0)
MCHC: 33.4 g/dL (ref 30.0–36.0)
MCV: 89.6 fL (ref 80.0–100.0)
Metamyelocytes Relative: 4 %
Monocytes Absolute: 0.4 10*3/uL (ref 0.1–1.0)
Monocytes Relative: 4 %
Myelocytes: 4 %
Neutro Abs: 7.8 10*3/uL — ABNORMAL HIGH (ref 1.7–7.7)
Neutrophils Relative %: 79 %
Platelets: 18 10*3/uL — CL (ref 150–400)
RBC: 3.27 MIL/uL — ABNORMAL LOW (ref 3.87–5.11)
RDW: 15.3 % (ref 11.5–15.5)
Smear Review: NORMAL
WBC: 9.9 10*3/uL (ref 4.0–10.5)
nRBC: 0.2 % (ref 0.0–0.2)

## 2022-03-04 LAB — COMPREHENSIVE METABOLIC PANEL
ALT: 106 U/L — ABNORMAL HIGH (ref 0–44)
AST: 81 U/L — ABNORMAL HIGH (ref 15–41)
Albumin: 3.5 g/dL (ref 3.5–5.0)
Alkaline Phosphatase: 155 U/L — ABNORMAL HIGH (ref 38–126)
Anion gap: 9 (ref 5–15)
BUN: 17 mg/dL (ref 8–23)
CO2: 19 mmol/L — ABNORMAL LOW (ref 22–32)
Calcium: 8.9 mg/dL (ref 8.9–10.3)
Chloride: 110 mmol/L (ref 98–111)
Creatinine, Ser: 1.41 mg/dL — ABNORMAL HIGH (ref 0.44–1.00)
GFR, Estimated: 40 mL/min — ABNORMAL LOW (ref >60)
Glucose, Bld: 113 mg/dL — ABNORMAL HIGH (ref 70–99)
Potassium: 3.7 mmol/L (ref 3.5–5.1)
Sodium: 138 mmol/L (ref 135–145)
Total Bilirubin: 0.4 mg/dL (ref 0.3–1.2)
Total Protein: 7.2 g/dL (ref 6.5–8.1)

## 2022-03-04 MED ORDER — SODIUM CHLORIDE 0.9% FLUSH
10.0000 mL | Freq: Once | INTRAVENOUS | Status: AC
  Administered 2022-03-04: 10 mL via INTRAVENOUS
  Filled 2022-03-04: qty 10

## 2022-03-04 MED ORDER — SODIUM CHLORIDE 0.9 % IV SOLN
Freq: Once | INTRAVENOUS | Status: AC
  Filled 2022-03-04: qty 250

## 2022-03-04 MED ORDER — HEPARIN SOD (PORK) LOCK FLUSH 100 UNIT/ML IV SOLN
500.0000 [IU] | Freq: Once | INTRAVENOUS | Status: AC
  Administered 2022-03-04: 500 [IU] via INTRAVENOUS
  Filled 2022-03-04: qty 5

## 2022-03-04 NOTE — Patient Instructions (Signed)
Managing Low Blood Counts During Cancer Treatment Cancer treatments such as chemotherapy and radiation can sometimes cause a drop in the supply of blood cells in the body, including red blood cells, white blood cells, and platelets. These blood cells are produced in the body and are released into the blood to perform specific functions: Red blood cells carry gases such as oxygen and carbon dioxide to and from your lungs. White blood cells help protect you from infection. Platelets help your body form blood clots to prevent and control bleeding. When cancer treatments cause a drop in blood cell counts, your body may not have enough cells to keep up its normal functions. Symptoms or problems that may result will vary depending on which type of blood cells the treatment is affecting. If your blood counts are low, you can take steps to help manage any problems. How can low blood counts affect me? Low blood counts have various effects depending on the type of blood cells involved: If you have a low number of red blood cells, you have a condition called anemia. This can cause symptoms such as: Feeling tired and weak. Feeling light-headed. Being short of breath. If you have a low number of white blood cells, you may be at higher risk for infections. If you have a low number of platelets, you may bleed more easily, or your body may have trouble stopping any bleeding. You may also have more bruising. How to manage symptoms or prevent problems from a low blood count If you have a low blood count, you can take steps to manage symptoms or prevent problems that may develop. The steps to take will depend on which type of blood cell is low. Low red blood cells Take these steps to help manage the symptoms of anemia: Go for a walk or do some light exercise each day. Take short naps during the day. Eat foods that contain a lot of iron and protein. These include leafy green vegetables, meat and fish, beans, sweet  potatoes, and dried fruit such as prunes, raisins, and apricots. Ask for help with errands and with work that needs to be done around the house. You need to save your energy. Take vitamins or supplements--such as iron, vitamin J18, or folic acid--as told by your health care provider. Practice relaxation techniques, such as yoga or meditation.  Low white blood cells Take these steps to help prevent infections: Keep your body clean. Make sure you: Wash your hands often with warm, soapy water. If you get a scrape or cut, clean it thoroughly right away. Take care when cleaning yourself after using the bathroom. Tell your health care provider if you have any rectal sores or bleeding. Avoid contact with pet waste. Wash your hands after handling pets. Do not swim or wade in lakes, ponds, rivers, water parks, or hot tubs. Avoid crowds of people and any person who has the flu or a fever. To protect yourself, you may be instructed to wear a mask whenever you are around other people. Avoid dental work. Check your mouth each day for sores or signs of infection. Do not share utensils. Avoid fresh flowers and plants or dried flowers. Follow food safety guidelines. Cook meat thoroughly and wash all raw fruits and vegetables.  Low platelets Take these steps to help prevent or control bleeding and bruising: Use an electric razor for shaving instead of a blade. Use a soft toothbrush and be careful when cleaning your mouth and teeth. Talk with your cancer  care team about whether you should avoid flossing. If your mouth is bleeding, rinse it with ice water. Avoid activities that could cause injury, such as contact sports. Talk with your health care provider about using stool softeners to avoid constipation and straining during bowel movements. Do not use medicines such as ibuprofen, aspirin, or naproxen unless your health care provider tells you to. Limit alcohol use. If you drink alcohol: Limit how much you  use to: 0-1 drink a day for women. 0-2 drinks a day for men. Be aware of how much alcohol is in your drink. In the [place] one drink equals one 12 oz bottle of beer (355 mL), one 5 oz glass of wine (148 mL), or one 1 oz glass of hard liquor (44 mL). Monitor any bleeding closely. If you start bleeding, hold pressure on the area for 5 minutes to stop the bleeding. Bleeding that does not stop is considered an emergency. What treatments can help increase a low blood count? If needed, your health care provider may recommend treatment for a low blood count. Treatment will depend on the type of blood cell that is low and the severity of your condition. Treatment options may include: Taking medicines to help stimulate the growth of blood cells. This is an option for treating a low red blood cell count. Your health care provider may also recommend that you take iron, folic acid, or vitamin B12 supplements. Making dietary changes. Including more iron and protein in your diet can help stimulate the growth of red blood cells. Adjusting your current medicines to help raise blood counts. Making changes to your treatment plan. Having a blood transfusion. This may be done if your blood count is very low. Where to find more information American Cancer Society: cancer.org Contact a health care provider if you: Feel extremely tired and weak. Have more bruising or bleeding. Feel ill or develop a cough. Have swelling or redness. Have mouth sores or a sore throat. Have painful urination or have blood in your urine or stool. Have abdominal pain or diarrhea. Are thinking of taking any new supplements or vitamins or making dietary changes. Get help right away if you: Are short of breath, have chest pain, or feel dizzy. Have a fever or chills. Have bleeding that will not stop. Summary Cancer treatments such as chemotherapy and radiation can sometimes cause a drop in the supply of blood cells in the body, including  red blood cells, white blood cells, and platelets. If you have a low blood count, you can take steps to manage symptoms or prevent problems that may develop. Depending on which type of blood cell is low, you may need to take steps to prevent infection, prevent bleeding, or manage symptoms that may develop. Any treatment will depend on the type of blood cell that is low and the severity of your condition. Contact a health care provider if you feel extremely tired and weak. This information is not intended to replace advice given to you by your health care provider. Make sure you discuss any questions you have with your health care provider. Document Revised: 08/15/2021 Document Reviewed: 12/25/2018 Elsevier Patient Education  [place].

## 2022-03-04 NOTE — Progress Notes (Signed)
Pt here today for IVF and labs. Platelets 18. Per Dr Darrall Dears have patient come in 11/14 for repeat labs. Education provided to patient about bleeding precautions and low platelet counts. Pt reports no bleeding but verbalized understanding to call us if she should start having bleeding

## 2022-03-05 ENCOUNTER — Telehealth: Payer: Self-pay | Admitting: *Deleted

## 2022-03-05 ENCOUNTER — Inpatient Hospital Stay: Payer: PPO

## 2022-03-05 DIAGNOSIS — Z5111 Encounter for antineoplastic chemotherapy: Secondary | ICD-10-CM | POA: Diagnosis not present

## 2022-03-05 DIAGNOSIS — C3490 Malignant neoplasm of unspecified part of unspecified bronchus or lung: Secondary | ICD-10-CM

## 2022-03-05 LAB — CBC
HCT: 30.8 % — ABNORMAL LOW (ref 36.0–46.0)
Hemoglobin: 10.1 g/dL — ABNORMAL LOW (ref 12.0–15.0)
MCH: 29.8 pg (ref 26.0–34.0)
MCHC: 32.8 g/dL (ref 30.0–36.0)
MCV: 90.9 fL (ref 80.0–100.0)
Platelets: 23 10*3/uL — CL (ref 150–400)
RBC: 3.39 MIL/uL — ABNORMAL LOW (ref 3.87–5.11)
RDW: 15.3 % (ref 11.5–15.5)
WBC: 11.9 10*3/uL — ABNORMAL HIGH (ref 4.0–10.5)
nRBC: 0.3 % — ABNORMAL HIGH (ref 0.0–0.2)

## 2022-03-05 NOTE — Telephone Encounter (Signed)
Critical lab result received from Collene Mares in lab. Platelets 23. Dr. Darrall Dears aware. Nurse reached out to patient to review results and assess any bleeding (active bleeding or concerns. Patient denies any bleeding at this time. ER/return to clinic precautions reviewed with patient and daughter. Patient to keep scheduled f/u as planned. Patient will reach out with any changes or concerns.

## 2022-03-08 ENCOUNTER — Ambulatory Visit
Admission: RE | Admit: 2022-03-08 | Discharge: 2022-03-08 | Disposition: A | Discharge status: Home / Self Care | Payer: PPO | Source: Ambulatory Visit | Attending: Internal Medicine | Admitting: Internal Medicine

## 2022-03-08 DIAGNOSIS — C7951 Secondary malignant neoplasm of bone: Secondary | ICD-10-CM | POA: Diagnosis not present

## 2022-03-08 DIAGNOSIS — C3490 Malignant neoplasm of unspecified part of unspecified bronchus or lung: Secondary | ICD-10-CM | POA: Insufficient documentation

## 2022-03-08 DIAGNOSIS — C7802 Secondary malignant neoplasm of left lung: Secondary | ICD-10-CM | POA: Diagnosis not present

## 2022-03-08 DIAGNOSIS — C7801 Secondary malignant neoplasm of right lung: Secondary | ICD-10-CM | POA: Diagnosis not present

## 2022-03-08 DIAGNOSIS — D7389 Other diseases of spleen: Secondary | ICD-10-CM | POA: Diagnosis not present

## 2022-03-08 DIAGNOSIS — K8020 Calculus of gallbladder without cholecystitis without obstruction: Secondary | ICD-10-CM | POA: Diagnosis not present

## 2022-03-08 DIAGNOSIS — N281 Cyst of kidney, acquired: Secondary | ICD-10-CM | POA: Diagnosis not present

## 2022-03-10 ENCOUNTER — Other Ambulatory Visit: Payer: Self-pay

## 2022-03-10 ENCOUNTER — Encounter: Payer: Self-pay | Admitting: Internal Medicine

## 2022-03-11 ENCOUNTER — Other Ambulatory Visit: Payer: Self-pay

## 2022-03-12 ENCOUNTER — Inpatient Hospital Stay (HOSPITAL_BASED_OUTPATIENT_CLINIC_OR_DEPARTMENT_OTHER): Payer: PPO | Admitting: Internal Medicine

## 2022-03-12 ENCOUNTER — Inpatient Hospital Stay: Payer: PPO

## 2022-03-12 VITALS — BP 154/81 | HR 98 | Temp 98.1°F | Ht 59.5 in | Wt 141.0 lb

## 2022-03-12 DIAGNOSIS — T451X5A Adverse effect of antineoplastic and immunosuppressive drugs, initial encounter: Secondary | ICD-10-CM | POA: Insufficient documentation

## 2022-03-12 DIAGNOSIS — Z5112 Encounter for antineoplastic immunotherapy: Secondary | ICD-10-CM | POA: Diagnosis not present

## 2022-03-12 DIAGNOSIS — C3490 Malignant neoplasm of unspecified part of unspecified bronchus or lung: Secondary | ICD-10-CM | POA: Diagnosis not present

## 2022-03-12 DIAGNOSIS — Z5111 Encounter for antineoplastic chemotherapy: Secondary | ICD-10-CM | POA: Diagnosis not present

## 2022-03-12 DIAGNOSIS — D6481 Anemia due to antineoplastic chemotherapy: Secondary | ICD-10-CM | POA: Diagnosis not present

## 2022-03-12 DIAGNOSIS — D6959 Other secondary thrombocytopenia: Secondary | ICD-10-CM | POA: Diagnosis not present

## 2022-03-12 DIAGNOSIS — M25551 Pain in right hip: Secondary | ICD-10-CM

## 2022-03-12 LAB — COMPREHENSIVE METABOLIC PANEL
ALT: 52 U/L — ABNORMAL HIGH (ref 0–44)
AST: 39 U/L (ref 15–41)
Albumin: 3.7 g/dL (ref 3.5–5.0)
Alkaline Phosphatase: 154 U/L — ABNORMAL HIGH (ref 38–126)
Anion gap: 8 (ref 5–15)
BUN: 31 mg/dL — ABNORMAL HIGH (ref 8–23)
CO2: 17 mmol/L — ABNORMAL LOW (ref 22–32)
Calcium: 9.1 mg/dL (ref 8.9–10.3)
Chloride: 108 mmol/L (ref 98–111)
Creatinine, Ser: 1.35 mg/dL — ABNORMAL HIGH (ref 0.44–1.00)
GFR, Estimated: 42 mL/min — ABNORMAL LOW (ref >60)
Glucose, Bld: 136 mg/dL — ABNORMAL HIGH (ref 70–99)
Potassium: 3.9 mmol/L (ref 3.5–5.1)
Sodium: 133 mmol/L — ABNORMAL LOW (ref 135–145)
Total Bilirubin: 0.4 mg/dL (ref 0.3–1.2)
Total Protein: 7.5 g/dL (ref 6.5–8.1)

## 2022-03-12 LAB — CBC WITH DIFFERENTIAL/PLATELET
Abs Immature Granulocytes: 0.18 10*3/uL — ABNORMAL HIGH (ref 0.00–0.07)
Basophils Absolute: 0 10*3/uL (ref 0.0–0.1)
Basophils Relative: 0 %
Eosinophils Absolute: 0 10*3/uL (ref 0.0–0.5)
Eosinophils Relative: 0 %
HCT: 28.9 % — ABNORMAL LOW (ref 36.0–46.0)
Hemoglobin: 9.5 g/dL — ABNORMAL LOW (ref 12.0–15.0)
Immature Granulocytes: 1 %
Lymphocytes Relative: 7 %
Lymphs Abs: 0.9 10*3/uL (ref 0.7–4.0)
MCH: 29.7 pg (ref 26.0–34.0)
MCHC: 32.9 g/dL (ref 30.0–36.0)
MCV: 90.3 fL (ref 80.0–100.0)
Monocytes Absolute: 0.6 10*3/uL (ref 0.1–1.0)
Monocytes Relative: 5 %
Neutro Abs: 10.9 10*3/uL — ABNORMAL HIGH (ref 1.7–7.7)
Neutrophils Relative %: 87 %
Platelets: 129 10*3/uL — ABNORMAL LOW (ref 150–400)
RBC: 3.2 MIL/uL — ABNORMAL LOW (ref 3.87–5.11)
RDW: 15.3 % (ref 11.5–15.5)
WBC: 12.6 10*3/uL — ABNORMAL HIGH (ref 4.0–10.5)
nRBC: 0.2 % (ref 0.0–0.2)

## 2022-03-12 MED ORDER — PROCHLORPERAZINE MALEATE 10 MG PO TABS
10.0000 mg | ORAL_TABLET | Freq: Once | ORAL | Status: AC
  Administered 2022-03-12: 10 mg via ORAL
  Filled 2022-03-12: qty 1

## 2022-03-12 MED ORDER — SODIUM CHLORIDE 0.9 % IV SOLN
200.0000 mg | Freq: Once | INTRAVENOUS | Status: AC
  Administered 2022-03-12: 200 mg via INTRAVENOUS
  Filled 2022-03-12: qty 200

## 2022-03-12 MED ORDER — PEGFILGRASTIM 6 MG/0.6ML ~~LOC~~ PSKT
6.0000 mg | PREFILLED_SYRINGE | Freq: Once | SUBCUTANEOUS | Status: AC
  Administered 2022-03-12: 6 mg via SUBCUTANEOUS
  Filled 2022-03-12: qty 0.6

## 2022-03-12 MED ORDER — SODIUM CHLORIDE 0.9 % IV SOLN
Freq: Once | INTRAVENOUS | Status: AC
  Filled 2022-03-12: qty 250

## 2022-03-12 MED ORDER — SODIUM CHLORIDE 0.9 % IV SOLN
250.0000 mg/m2 | Freq: Once | INTRAVENOUS | Status: AC
  Administered 2022-03-12: 400 mg via INTRAVENOUS
  Filled 2022-03-12: qty 16

## 2022-03-12 MED ORDER — HEPARIN SOD (PORK) LOCK FLUSH 100 UNIT/ML IV SOLN
500.0000 [IU] | Freq: Once | INTRAVENOUS | Status: AC | PRN
  Administered 2022-03-12: 500 [IU]
  Filled 2022-03-12: qty 5

## 2022-03-12 NOTE — Progress Notes (Signed)
Patient had hip pain after her last chemo treatment.  No pain today, Just "stiff".  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

## 2022-03-12 NOTE — Progress Notes (Signed)
[facility] OFFICE PROGRESS NOTE  Patient Care Team: Earlie Server, MD as PCP - General (Oncology) Telford Nab, RN as Oncology Nurse Navigator  TREATMENT:  Right hip palliative RT 30 cGy completed 12/18/2021 Carboplatin, alimta and Keytruda started 12/18/2021  ASSESSMENT & PLAN:  # Primary lung adenocarcinoma ([place]), Stage IV, PDL1 1% -Diagnosed 11/19/2021 after transbronchial biopsy of LUL nodule by Dr. Patsey Berthold.  Metastatic to pelvis, bilateral hips R>L and numerous pulmonary nodules. -MRI brain showed no intracranial mets. 1 cm indeterminate left frontal calvarium lesion. Hope 1 testing showed PD-L1 1%.  No other targetable mutations.  Report below  -Completed 4 cycles of carboplatin, Alimta and Keytruda on 02/19/2022.  Restaging CT chest abdomen pelvis showed good response to treatment.  Today we will start on maintenance Alimta and Keytruda.  With cycle 4 patient had hematologic toxicity with platelet nadir to 18,000.  I will dose reduce Alimta by 50%.  Labs reviewed and acceptable for treatment.  Hemoglobin 9.5.  Will hold off on Aranesp.  She will follow-up in 1 week for repeat labs and possible hydration.  -Sought second opinion with Dr. Varney Biles at [facility] who agreed with our treatment plan.  He has sent out lung fusion panel which we will follow-up.  # Normocytic anemia  -Likely secondary to chemotherapy and iron panel consistent with anemia of chronic disease. -Hemoglobin 9.5 today.  Hold off on Aranesp.  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 Donella Stade on 12/18/2021 -Was seen by [facility].  No surgical intervention needed. -Denies any pain.  Right hip feels stiff.  Referral to home PT.  # Dry Cough  -Improved -She is using Flonase and Claritin which is not helping.   -She is using [employer] as needed.  #Mild difficulty swallowing -Intermittent in nature.  CT chest did not show any  concerns.  # Access - port  RTC in 1 week for hydration, labs RTC in 3 weeks for MD visit, labs, Alimta and Keytruda.   Orders Placed This Encounter  Procedures   Thyroid Panel With TSH    Standing Status:   Future    Standing Expiration Date:   03/13/2023   CBC with Differential    Standing Status:   Future    Standing Expiration Date:   03/12/2023   Comprehensive metabolic panel    Standing Status:   Future    Standing Expiration Date:   03/12/2023   Ambulatory referral to [facility]    Referral Priority:   Routine    Referral Type:   [facility]    Referral Reason:   Specialty Services Required    Requested Specialty:   [place]    Number of Visits Requested:   1   [facility]- 12/06/2021   All questions were answered. The patient knows to call the clinic with any problems, questions or concerns. The total time spent in the appointment was 35 minutes encounter with patients including review of chart and various tests results, discussions about plan of care and coordination of care plan   Jane Canary, MD 03/12/2022 1:20 PM  INTERVAL HISTORY: Please see below for problem oriented charting.  Patient following with [place] for treatment of stage IV lung adenocarcinoma.  Currently on Alimta and Keytruda maintenance.  Patient seen today accompanied by daughter to discuss CT scan and for treatment. She reports feeling well.  1 day she had pain in  her right hip.  Feels more stiff now.  Denies fever, chills, nausea, vomiting, shortness of breath, chest pain, bleeding.  REVIEW OF SYSTEMS:   Positive ROS as above.  Rest 10 points ROS negative   I have reviewed the past medical history, past surgical history, social history and family history with the patient and they are unchanged from previous note.  ALLERGIES:  is allergic to diclofenac, lisinopril, and sulfa antibiotics.  MEDICATIONS:  Current Outpatient Medications  Medication Sig Dispense Refill    albuterol (VENTOLIN HFA) 108 (90 Base) MCG/ACT inhaler Inhale 2 puffs into the lungs every 6 (six) hours as needed for wheezing or shortness of breath. 18 g 0   benzonatate (TESSALON) 200 MG capsule Take 1 capsule (200 mg total) by mouth 3 (three) times daily as needed for cough. 30 capsule 0   dexamethasone (DECADRON) 4 MG tablet Starting next cycle, take 4 mg two times a day, a day prior to chemotherapy. Do not take on the day of chemotherapy. Then take for one day after chemotherapy. 20 tablet 0   fluticasone (FLONASE) 50 MCG/ACT nasal spray USE TWO SPRAYS IN EACH NOSTRIL ONCE DAILY prn 16 g 12   folic acid (FOLVITE) 1 MG tablet Take 1 tablet (1 mg total) by mouth daily. Start 7 days before pemetrexed chemotherapy. Continue until 21 days after pemetrexed completed. 100 tablet 3   lidocaine-prilocaine (EMLA) cream Apply to port site 30 mins prior using once 30 g 3   loratadine (CLARITIN) 10 MG tablet TAKE ONE TABLET BY MOUTH DAILY AS NEEDED FOR ALLERGIES 90 tablet 3   losartan (COZAAR) 100 MG tablet Take 1 tablet (100 mg total) by mouth daily. 90 tablet 3   Multiple Vitamin (ONE-DAILY MULTI-VITAMIN PO) Take by mouth daily.     nebivolol (BYSTOLIC) 2.5 MG tablet Take 1 tablet (2.5 mg total) by mouth daily. 90 tablet 3   ondansetron (ZOFRAN) 8 MG tablet Take 1 tablet (8 mg total) by mouth every 8 (eight) hours as needed for nausea or vomiting. Start on the third day after carboplatin. 30 tablet 1   Chromium Picolinate (CHROMIUM PICOLATE PO) Take by mouth daily. (Patient not taking: Reported on 01/21/2022)     Omega-3 Fatty Acids (FISH OIL) 1000 MG CAPS Take by mouth. (Patient not taking: Reported on 01/21/2022)     omeprazole (PRILOSEC) 20 MG capsule Take 1 capsule (20 mg total) by mouth daily. (Patient not taking: Reported on 01/21/2022) 10 capsule 0   prochlorperazine (COMPAZINE) 10 MG tablet Take 1 tablet (10 mg total) by mouth every 6 (six) hours as needed for nausea or vomiting. (Patient not taking:  Reported on 12/18/2021) 30 tablet 1   Turmeric (QC TUMERIC COMPLEX PO) Take 2 tablets by mouth daily. (Patient not taking: Reported on 11/28/2021)     No current facility-administered medications for this visit.   Facility-Administered Medications Ordered in Other Visits  Medication Dose Route Frequency Provider Last Rate Last Admin   heparin lock flush 100 UNIT/ML injection             SUMMARY OF ONCOLOGIC HISTORY: Oncology History  Primary lung adenocarcinoma ([place])  10/10/2021 Initial Diagnosis   Primary lung adenocarcinoma ([place]) Stage IV  Patient seen by PCP for wheezing for 2 weeks. Imaging with CXR followed by CT chest showed Spiculated 2.1 x 2.2 cm left upper lobe pulmonary nodule with associated pleural tethering. Innumerable subcentimeter pulmonary nodules throughout the lungs.      11/14/2021 PET scan   IMPRESSION:  1. Left suprahilar nodule with maximum SUV 4.5, and a more distal 2.2 cm left upper lobe nodule with maximum SUV of 5.5. 2. Innumerable scattered small pulmonary nodules, most of which are under 5 mm in diameter, throughout both lungs. Some are cavitary. Possibilities include cavitating hematogenous disseminated malignancy versus infectious etiology subset as septic emboli. Most of the nodules are stable, some are minimally enlarged compared to 10/10/2021. 3. Abnormal mild permeative type bony findings in the right hemipelvis associated with substantially accentuated metabolic activity. Possible associated pathologic fracture through the right quadrilateral plate and right acetabulum. The findings involve the right ilium and ischium but also the right lateral sacral ala and a small focus in the left sacrum.    Pathology Results   She had transbronchial biopsies by Dr. Patsey Berthold on 11/19/21.  Pathology from LUL nodule showed adenocarcinoma, consistent with lung primary.    11/19/2021 Cancer Staging   Staging form: Lung, AJCC 8th Edition - Clinical stage from  11/19/2021: Stage IVB (cT1c, cM1c) - Signed by Jane Canary, MD on 11/27/2021 Stage prefix: Initial diagnosis   11/27/2021 Imaging   MRI pelvis  IMPRESSION: 1. Abnormal marrow signal throughout the right ilium involving the right acetabulum, right superior pubic ramus and right inferior pubic ramus, right side of the sacrum and a smaller focus of abnormal marrow lesion in the left side of the sacrum. Findings are concerning for metastatic disease. 2. Nondisplaced pathologic fracture of the quadrilateral plate of the right acetabulum. 3. Mild muscle edema in the right gluteus minimus and medius muscles likely reflecting mild muscle strain.  MRI brain  IMPRESSION: No evidence of intracranial metastatic disease.   Indeterminate 1 cm left frontal calvarium lesion.   12/18/2021 -  Chemotherapy   Patient is on Treatment Plan : LUNG Carboplatin (5) + Pemetrexed (500) + Pembrolizumab (200) D1 q21d Induction x 4 cycles / Maintenance Pemetrexed (500) + Pembrolizumab (200) D1 q21d       PHYSICAL EXAMINATION: ECOG PERFORMANCE STATUS: 2 - Symptomatic, <50% confined to bed  Vitals:   03/12/22 1017  BP: (!) 154/81  Pulse: 98  Temp: 98.1 F (36.7 C)    Filed Weights   03/12/22 1017  Weight: 141 lb (64 kg)     Physical Exam Constitutional:      Appearance: Normal appearance.  HENT:     Head: Normocephalic and atraumatic.  Cardiovascular:     Rate and Rhythm: Normal rate.  Pulmonary:     Effort: Pulmonary effort is normal.  Musculoskeletal:     Right lower leg: No edema.     Left lower leg: No edema.  Skin:    General: Skin is warm.  Neurological:     General: No focal deficit present.     Mental Status: She is oriented to person, place, and time.  Psychiatric:        Mood and Affect: Mood normal.     LABORATORY DATA:  I have reviewed the data as listed    Component Value Date/Time   NA 133 (L) 03/12/2022 0918   K 3.9 03/12/2022 0918   CL 108 03/12/2022 0918   CO2  17 (L) 03/12/2022 0918   GLUCOSE 136 (H) 03/12/2022 0918   BUN 31 (H) 03/12/2022 0918   CREATININE 1.35 (H) 03/12/2022 0918   CALCIUM 9.1 03/12/2022 0918   PROT 7.5 03/12/2022 0918   ALBUMIN 3.7 03/12/2022 0918   AST 39 03/12/2022 0918   ALT 52 (H) 03/12/2022 0918   ALKPHOS 154 (H)  03/12/2022 0918   BILITOT 0.4 03/12/2022 0918   GFRNONAA 42 (L) 03/12/2022 0918    No results found for: "SPEP", "UPEP"  Lab Results  Component Value Date   WBC 12.6 (H) 03/12/2022   NEUTROABS 10.9 (H) 03/12/2022   HGB 9.5 (L) 03/12/2022   HCT 28.9 (L) 03/12/2022   MCV 90.3 03/12/2022   PLT 129 (L) 03/12/2022      Chemistry      Component Value Date/Time   NA 133 (L) 03/12/2022 0918   K 3.9 03/12/2022 0918   CL 108 03/12/2022 0918   CO2 17 (L) 03/12/2022 0918   BUN 31 (H) 03/12/2022 0918   CREATININE 1.35 (H) 03/12/2022 0918      Component Value Date/Time   CALCIUM 9.1 03/12/2022 0918   ALKPHOS 154 (H) 03/12/2022 0918   AST 39 03/12/2022 0918   ALT 52 (H) 03/12/2022 0918   BILITOT 0.4 03/12/2022 0918       RADIOGRAPHIC STUDIES: I have personally reviewed the radiological images as listed and agreed with the findings in the report. CT CHEST ABDOMEN PELVIS WO CONTRAST  Result Date: 03/10/2022 CLINICAL DATA:  Non-small cell lung cancer (NSCLC), monitor, currently on chemotherapy. * Tracking Code: BO * EXAM: CT CHEST, ABDOMEN AND PELVIS WITHOUT CONTRAST TECHNIQUE: Multidetector CT imaging of the chest, abdomen and pelvis was performed following the standard protocol without IV contrast. RADIATION DOSE REDUCTION: This exam was performed according to the departmental dose-optimization program which includes automated exposure control, adjustment of the mA and/or kV according to patient size and/or use of iterative reconstruction technique. COMPARISON:  11/14/2021 chest CT and PET-CT. FINDINGS: CT CHEST FINDINGS Cardiovascular: Normal heart size. No significant pericardial effusion/thickening.  Right internal jugular Port-A-Cath terminates at the cavoatrial junction. Great vessels are normal in course and caliber. Mediastinum/Nodes: No significant thyroid nodules. Unremarkable esophagus. No axillary adenopathy. No pathologically enlarged mediastinal nodes. No discrete right hilar adenopathy on these noncontrast images. Left superior hilar adenopathy has decreased from 1.1 cm to 0.7 cm (series 2/image 20). Lungs/Pleura: No pneumothorax. No pleural effusion. Irregular solid central left upper lobe 2.5 x 2.0 cm pulmonary nodule (series 3/image 40), decreased from 3.1 x 2.3 cm using similar measurement technique. Innumerable (> than 50) indistinct small pulmonary nodules scattered throughout both lungs are decreased in size. Representative 0.5 cm superior segment left lower lobe nodule (series 3/image 24), decreased from 0.8 cm. Representative peripheral right lower lobe 0.4 cm nodule (series 3/image 67), decreased from 0.7 cm. Similar mildly irregular interlobular septal thickening throughout both lungs with an upper lobe predominance. No appreciable new or enlarging pulmonary nodules. Musculoskeletal: Sclerotic right humeral head 1.2 cm lesion (series 3/image 29), better defined and increased in sclerosis in the interval. No additional focal osseous lesions in the chest. Mild thoracic spondylosis. CT ABDOMEN PELVIS FINDINGS Hepatobiliary: Normal liver with no liver mass. Cholelithiasis. No biliary ductal dilatation. Pancreas: Normal, with no mass or duct dilation. Spleen: Normal size spleen. Hypodense 1.7 cm superior splenic lesion (series 2/image 46), not appreciably changed. No new splenic lesions. Adrenals/Urinary Tract: No discrete adrenal nodules. No renal stones. No hydronephrosis. Simple 1.9 cm lower right renal cyst, for which no follow-up imaging is recommended. No additional contour deforming renal masses. Normal bladder. Stomach/Bowel: Normal non-distended stomach. Normal caliber small bowel with  no small bowel wall thickening. Normal appendix. Normal large bowel with no diverticulosis, large bowel wall thickening or pericolonic fat stranding. Vascular/Lymphatic: Atherosclerotic nonaneurysmal abdominal aorta. No pathologically enlarged lymph nodes in the abdomen  or pelvis. Reproductive: No adnexal masses.  Stable nonenlarged uterus. Other: No pneumoperitoneum, ascites or focal fluid collection. Musculoskeletal: Increased patchy abnormal sclerosis throughout the right iliac bone, lateral right sacral ala and in discrete L1, L4 and L5 vertebral lesions and left sacral ala lesions. Mild lumbar spondylosis. Partially visualized fixation rod in the proximal left femur. IMPRESSION: 1. Interval positive response to therapy. 2. Irregular solid central left upper lobe lung mass is decreased. Left suprahilar adenopathy is decreased. Innumerable small bilateral pulmonary metastases are all decreased. 3. Increased sclerosis throughout multifocal sclerotic bone metastases in the right humeral head, lumbar spine, sacrum and right hemipelvis, compatible with treatment response. 4. No unenhanced CT evidence of new or progressive metastatic disease. 5. Chronic findings include: Stable indeterminate hypodense 1.7 cm superior splenic lesion, previously non FDG avid on PET-CT, more likely benign. Cholelithiasis. Aortic Atherosclerosis (ICD10-I70.0). Electronically Signed   By: Ilona Sorrel M.D.   On: 03/10/2022 11:24   MM 3D SCREEN BREAST BILATERAL  Addendum Date: 03/03/2022   ADDENDUM REPORT: 03/03/2022 15:15 ADDENDUM: Additional information has been provided. Patient has stage IV lung cancer and is receiving palliative chemotherapy and treatment. As such, mild increase in size of several left axillary lymph nodes is most consistent with a reactive etiology. Findings: No suspicious mass, distortion, or microcalcifications are identified to suggest presence of breast malignancy. IMPRESSION: No mammographic evidence of  breast malignancy bilaterally. Favored reactive LEFT axillary lymph nodes. Recommendation: Recommend continued clinical management of known stage IV lung cancer. Annual mammography can be considered if clinically indicated. BI-RADS: 2: Benign. Electronically Signed   By: Valentino Saxon M.D.   On: 03/03/2022 15:15   Result Date: 03/03/2022 CLINICAL DATA:  Screening. EXAM: DIGITAL SCREENING BILATERAL MAMMOGRAM WITH TOMOSYNTHESIS AND CAD TECHNIQUE: Bilateral screening digital craniocaudal and mediolateral oblique mammograms were obtained. Bilateral screening digital breast tomosynthesis was performed. The images were evaluated with computer-aided detection. COMPARISON:  Previous exam(s). ACR Breast Density Category b: There are scattered areas of fibroglandular density. FINDINGS: In the left axilla, mildly prominent lymph nodes warrant further evaluation. In the right breast, no findings suspicious for malignancy. IMPRESSION: Further evaluation is suggested for possibly enlarged lymph nodes in the left axilla. RECOMMENDATION: Ultrasound of the left axilla. (Code:[id number]) The patient will be contacted regarding the findings, and additional imaging will be scheduled. BI-RADS CATEGORY  0: Incomplete. Need additional imaging evaluation and/or prior mammograms for comparison. Electronically Signed: By: Valentino Saxon M.D. On: 02/28/2022 16:34

## 2022-03-12 NOTE — Patient Instructions (Signed)
[facility] CANCER CTR AT [place]  Discharge Instructions: Thank you for choosing [facility] to provide your oncology and hematology care.  If you have a lab appointment with the [facility], please go directly to the [facility] and check in at the registration area.  Wear comfortable clothing and clothing appropriate for easy access to any Portacath or PICC line.   We strive to give you quality time with your provider. You may need to reschedule your appointment if you arrive late (15 or more minutes).  Arriving late affects you and other patients whose appointments are after yours.  Also, if you miss three or more appointments without notifying the office, you may be dismissed from the clinic at the provider's discretion.      For prescription refill requests, have your pharmacy contact our office and allow 72 hours for refills to be completed.    Today you received the following chemotherapy and/or immunotherapy agents- Keytruda, alimta      To help prevent nausea and vomiting after your treatment, we encourage you to take your nausea medication as directed.  BELOW ARE SYMPTOMS THAT SHOULD BE REPORTED IMMEDIATELY: *FEVER GREATER THAN 100.4 F (38 C) OR HIGHER *CHILLS OR SWEATING *NAUSEA AND VOMITING THAT IS NOT CONTROLLED WITH YOUR NAUSEA MEDICATION *UNUSUAL SHORTNESS OF BREATH *UNUSUAL BRUISING OR BLEEDING *URINARY PROBLEMS (pain or burning when urinating, or frequent urination) *BOWEL PROBLEMS (unusual diarrhea, constipation, pain near the anus) TENDERNESS IN MOUTH AND THROAT WITH OR WITHOUT PRESENCE OF ULCERS (sore throat, sores in mouth, or a toothache) UNUSUAL RASH, SWELLING OR PAIN  UNUSUAL VAGINAL DISCHARGE OR ITCHING   Items with * indicate a potential emergency and should be followed up as soon as possible or go to the Emergency Department if any problems should occur.  Please show the CHEMOTHERAPY ALERT CARD or IMMUNOTHERAPY ALERT CARD at  check-in to the Emergency Department and triage nurse.  Should you have questions after your visit or need to cancel or reschedule your appointment, please contact [facility] CANCER [place] AT [place]  [phone] and follow the prompts.  Office hours are 8:00 a.m. to 4:30 p.m. Monday - Friday. Please note that voicemails left after 4:00 p.m. may not be returned until the following business day.  We are closed weekends and major holidays. You have access to a nurse at all times for urgent questions. Please call the main number to the clinic [phone] and follow the prompts.  For any non-urgent questions, you may also contact your provider using MyChart. We now offer e-Visits for anyone 49 and older to request care online for non-urgent symptoms. For details visit [email or web address].   Also download the MyChart app! Go to the app store, search "MyChart", open the app, select [facility], and log in with your MyChart username and password.  Masks are optional in the cancer centers. If you would like for your care team to wear a mask while they are taking care of you, please let them know. For doctor visits, patients may have with them one support person who is at least 70 years old. At this time, visitors are not allowed in the infusion area.

## 2022-03-13 ENCOUNTER — Inpatient Hospital Stay: Payer: PPO

## 2022-03-13 NOTE — Progress Notes (Signed)
Nutrition Follow-up:  Patient with stage IV lung cancer.  Patient receiving carboplatin, pemetrexed and keytruda.  Spoke with patient via phone.  Reports that her appetite is a little bit better.  Indigestion is better. Says that she has been eating 2 meals a day and snacks in between.  Has been eating pork, beef, chicken, vegetables, cornbread. Snacks on peanut butter crackers, cheese and crackers, trail mix, yogurt, fruit, premier protein shake.      Medications: reviewed  Labs: reviewed  Anthropometrics:   Weight 141 lb 149 lb 10/10   NUTRITION DIAGNOSIS: Increased nutrient needs continue    INTERVENTION:  Continue to encourage good sources of protein at each meal/snack Continue premier protein shake    MONITORING, EVALUATION, GOAL: weight trends, intake   NEXT VISIT: Tuesday, Dec 12 during infusion  Tulani Kidney B. Zenia Resides, [place], [place] Registered Dietitian [phone]

## 2022-03-19 ENCOUNTER — Inpatient Hospital Stay: Payer: PPO

## 2022-03-19 VITALS — BP 108/70 | HR 102 | Temp 99.4°F | Resp 20

## 2022-03-19 DIAGNOSIS — Z5111 Encounter for antineoplastic chemotherapy: Secondary | ICD-10-CM | POA: Diagnosis not present

## 2022-03-19 DIAGNOSIS — C3490 Malignant neoplasm of unspecified part of unspecified bronchus or lung: Secondary | ICD-10-CM

## 2022-03-19 LAB — CBC WITH DIFFERENTIAL/PLATELET
Abs Immature Granulocytes: 0.1 10*3/uL — ABNORMAL HIGH (ref 0.00–0.07)
Basophils Absolute: 0 10*3/uL (ref 0.0–0.1)
Basophils Relative: 1 %
Eosinophils Absolute: 0 10*3/uL (ref 0.0–0.5)
Eosinophils Relative: 1 %
HCT: 27.9 % — ABNORMAL LOW (ref 36.0–46.0)
Hemoglobin: 9.3 g/dL — ABNORMAL LOW (ref 12.0–15.0)
Immature Granulocytes: 6 %
Lymphocytes Relative: 24 %
Lymphs Abs: 0.4 10*3/uL — ABNORMAL LOW (ref 0.7–4.0)
MCH: 30.4 pg (ref 26.0–34.0)
MCHC: 33.3 g/dL (ref 30.0–36.0)
MCV: 91.2 fL (ref 80.0–100.0)
Monocytes Absolute: 0.1 10*3/uL (ref 0.1–1.0)
Monocytes Relative: 6 %
Neutro Abs: 1.1 10*3/uL — ABNORMAL LOW (ref 1.7–7.7)
Neutrophils Relative %: 62 %
Platelets: 53 10*3/uL — ABNORMAL LOW (ref 150–400)
RBC: 3.06 MIL/uL — ABNORMAL LOW (ref 3.87–5.11)
RDW: 16.1 % — ABNORMAL HIGH (ref 11.5–15.5)
Smear Review: NORMAL
WBC: 1.8 10*3/uL — ABNORMAL LOW (ref 4.0–10.5)
nRBC: 0 % (ref 0.0–0.2)

## 2022-03-19 LAB — COMPREHENSIVE METABOLIC PANEL
ALT: 59 U/L — ABNORMAL HIGH (ref 0–44)
AST: 51 U/L — ABNORMAL HIGH (ref 15–41)
Albumin: 3.3 g/dL — ABNORMAL LOW (ref 3.5–5.0)
Alkaline Phosphatase: 142 U/L — ABNORMAL HIGH (ref 38–126)
Anion gap: 7 (ref 5–15)
BUN: 31 mg/dL — ABNORMAL HIGH (ref 8–23)
CO2: 18 mmol/L — ABNORMAL LOW (ref 22–32)
Calcium: 8.7 mg/dL — ABNORMAL LOW (ref 8.9–10.3)
Chloride: 109 mmol/L (ref 98–111)
Creatinine, Ser: 1.54 mg/dL — ABNORMAL HIGH (ref 0.44–1.00)
GFR, Estimated: 36 mL/min — ABNORMAL LOW (ref >60)
Glucose, Bld: 124 mg/dL — ABNORMAL HIGH (ref 70–99)
Potassium: 3.8 mmol/L (ref 3.5–5.1)
Sodium: 134 mmol/L — ABNORMAL LOW (ref 135–145)
Total Bilirubin: 0.6 mg/dL (ref 0.3–1.2)
Total Protein: 7.2 g/dL (ref 6.5–8.1)

## 2022-03-19 MED ORDER — HEPARIN SOD (PORK) LOCK FLUSH 100 UNIT/ML IV SOLN
500.0000 [IU] | Freq: Once | INTRAVENOUS | Status: AC
  Administered 2022-03-19: 500 [IU] via INTRAVENOUS
  Filled 2022-03-19: qty 5

## 2022-03-19 MED ORDER — SODIUM CHLORIDE 0.9 % IV SOLN
Freq: Once | INTRAVENOUS | Status: AC
  Filled 2022-03-19: qty 250

## 2022-03-19 MED ORDER — SODIUM CHLORIDE 0.9% FLUSH
10.0000 mL | Freq: Once | INTRAVENOUS | Status: AC
  Administered 2022-03-19: 10 mL via INTRAVENOUS
  Filled 2022-03-19: qty 10

## 2022-03-20 DIAGNOSIS — H2513 Age-related nuclear cataract, bilateral: Secondary | ICD-10-CM | POA: Diagnosis not present

## 2022-04-02 ENCOUNTER — Inpatient Hospital Stay: Payer: PPO | Attending: Internal Medicine | Admitting: Internal Medicine

## 2022-04-02 ENCOUNTER — Encounter: Payer: Self-pay | Admitting: Internal Medicine

## 2022-04-02 ENCOUNTER — Inpatient Hospital Stay: Payer: PPO

## 2022-04-02 ENCOUNTER — Encounter: Payer: Self-pay | Admitting: *Deleted

## 2022-04-02 VITALS — BP 129/72 | HR 95 | Temp 98.6°F | Resp 20 | Wt 141.0 lb

## 2022-04-02 DIAGNOSIS — K8020 Calculus of gallbladder without cholecystitis without obstruction: Secondary | ICD-10-CM | POA: Diagnosis not present

## 2022-04-02 DIAGNOSIS — T451X5A Adverse effect of antineoplastic and immunosuppressive drugs, initial encounter: Secondary | ICD-10-CM | POA: Diagnosis not present

## 2022-04-02 DIAGNOSIS — D72819 Decreased white blood cell count, unspecified: Secondary | ICD-10-CM | POA: Diagnosis not present

## 2022-04-02 DIAGNOSIS — M47814 Spondylosis without myelopathy or radiculopathy, thoracic region: Secondary | ICD-10-CM | POA: Insufficient documentation

## 2022-04-02 DIAGNOSIS — Z5112 Encounter for antineoplastic immunotherapy: Secondary | ICD-10-CM

## 2022-04-02 DIAGNOSIS — C3490 Malignant neoplasm of unspecified part of unspecified bronchus or lung: Secondary | ICD-10-CM

## 2022-04-02 DIAGNOSIS — R609 Edema, unspecified: Secondary | ICD-10-CM | POA: Diagnosis not present

## 2022-04-02 DIAGNOSIS — D6481 Anemia due to antineoplastic chemotherapy: Secondary | ICD-10-CM | POA: Insufficient documentation

## 2022-04-02 DIAGNOSIS — R1310 Dysphagia, unspecified: Secondary | ICD-10-CM | POA: Diagnosis not present

## 2022-04-02 DIAGNOSIS — R7401 Elevation of levels of liver transaminase levels: Secondary | ICD-10-CM | POA: Diagnosis not present

## 2022-04-02 DIAGNOSIS — Z79899 Other long term (current) drug therapy: Secondary | ICD-10-CM | POA: Insufficient documentation

## 2022-04-02 DIAGNOSIS — I10 Essential (primary) hypertension: Secondary | ICD-10-CM | POA: Insufficient documentation

## 2022-04-02 DIAGNOSIS — D6959 Other secondary thrombocytopenia: Secondary | ICD-10-CM | POA: Diagnosis not present

## 2022-04-02 DIAGNOSIS — C3412 Malignant neoplasm of upper lobe, left bronchus or lung: Secondary | ICD-10-CM | POA: Diagnosis not present

## 2022-04-02 DIAGNOSIS — C7951 Secondary malignant neoplasm of bone: Secondary | ICD-10-CM | POA: Diagnosis not present

## 2022-04-02 DIAGNOSIS — Z5111 Encounter for antineoplastic chemotherapy: Secondary | ICD-10-CM | POA: Diagnosis not present

## 2022-04-02 DIAGNOSIS — D649 Anemia, unspecified: Secondary | ICD-10-CM | POA: Insufficient documentation

## 2022-04-02 DIAGNOSIS — Z8616 Personal history of COVID-19: Secondary | ICD-10-CM | POA: Insufficient documentation

## 2022-04-02 DIAGNOSIS — Z7952 Long term (current) use of systemic steroids: Secondary | ICD-10-CM | POA: Diagnosis not present

## 2022-04-02 LAB — COMPREHENSIVE METABOLIC PANEL
ALT: 153 U/L — ABNORMAL HIGH (ref 0–44)
AST: 92 U/L — ABNORMAL HIGH (ref 15–41)
Albumin: 3.5 g/dL (ref 3.5–5.0)
Alkaline Phosphatase: 171 U/L — ABNORMAL HIGH (ref 38–126)
Anion gap: 7 (ref 5–15)
BUN: 29 mg/dL — ABNORMAL HIGH (ref 8–23)
CO2: 19 mmol/L — ABNORMAL LOW (ref 22–32)
Calcium: 8.7 mg/dL — ABNORMAL LOW (ref 8.9–10.3)
Chloride: 113 mmol/L — ABNORMAL HIGH (ref 98–111)
Creatinine, Ser: 1.5 mg/dL — ABNORMAL HIGH (ref 0.44–1.00)
GFR, Estimated: 37 mL/min — ABNORMAL LOW (ref >60)
Glucose, Bld: 106 mg/dL — ABNORMAL HIGH (ref 70–99)
Potassium: 4 mmol/L (ref 3.5–5.1)
Sodium: 139 mmol/L (ref 135–145)
Total Bilirubin: 0.6 mg/dL (ref 0.3–1.2)
Total Protein: 7.1 g/dL (ref 6.5–8.1)

## 2022-04-02 LAB — CBC WITH DIFFERENTIAL/PLATELET
Abs Immature Granulocytes: 0.05 10*3/uL (ref 0.00–0.07)
Basophils Absolute: 0 10*3/uL (ref 0.0–0.1)
Basophils Relative: 0 %
Eosinophils Absolute: 0.5 10*3/uL (ref 0.0–0.5)
Eosinophils Relative: 7 %
HCT: 24 % — ABNORMAL LOW (ref 36.0–46.0)
Hemoglobin: 7.9 g/dL — ABNORMAL LOW (ref 12.0–15.0)
Immature Granulocytes: 1 %
Lymphocytes Relative: 15 %
Lymphs Abs: 1.1 10*3/uL (ref 0.7–4.0)
MCH: 30.6 pg (ref 26.0–34.0)
MCHC: 32.9 g/dL (ref 30.0–36.0)
MCV: 93 fL (ref 80.0–100.0)
Monocytes Absolute: 0.7 10*3/uL (ref 0.1–1.0)
Monocytes Relative: 9 %
Neutro Abs: 4.8 10*3/uL (ref 1.7–7.7)
Neutrophils Relative %: 68 %
Platelets: 187 10*3/uL (ref 150–400)
RBC: 2.58 MIL/uL — ABNORMAL LOW (ref 3.87–5.11)
RDW: 18.5 % — ABNORMAL HIGH (ref 11.5–15.5)
WBC: 7.1 10*3/uL (ref 4.0–10.5)
nRBC: 0 % (ref 0.0–0.2)

## 2022-04-02 MED ORDER — CYANOCOBALAMIN 1000 MCG/ML IJ SOLN
1000.0000 ug | Freq: Once | INTRAMUSCULAR | Status: AC
  Administered 2022-04-02: 1000 ug via INTRAMUSCULAR
  Filled 2022-04-02: qty 1

## 2022-04-02 MED ORDER — SODIUM CHLORIDE 0.9% FLUSH
10.0000 mL | Freq: Once | INTRAVENOUS | Status: AC
  Administered 2022-04-02: 10 mL via INTRAVENOUS
  Filled 2022-04-02: qty 10

## 2022-04-02 MED ORDER — PROCHLORPERAZINE MALEATE 10 MG PO TABS
10.0000 mg | ORAL_TABLET | Freq: Once | ORAL | Status: AC
  Administered 2022-04-02: 10 mg via ORAL
  Filled 2022-04-02: qty 1

## 2022-04-02 MED ORDER — HEPARIN SOD (PORK) LOCK FLUSH 100 UNIT/ML IV SOLN
500.0000 [IU] | Freq: Once | INTRAVENOUS | Status: DC | PRN
  Filled 2022-04-02: qty 5

## 2022-04-02 MED ORDER — PEGFILGRASTIM 6 MG/0.6ML ~~LOC~~ PSKT
6.0000 mg | PREFILLED_SYRINGE | Freq: Once | SUBCUTANEOUS | Status: AC
  Administered 2022-04-02: 6 mg via SUBCUTANEOUS
  Filled 2022-04-02: qty 0.6

## 2022-04-02 MED ORDER — SODIUM CHLORIDE 0.9 % IV SOLN
200.0000 mg/m2 | Freq: Once | INTRAVENOUS | Status: AC
  Administered 2022-04-02: 300 mg via INTRAVENOUS
  Filled 2022-04-02: qty 12

## 2022-04-02 MED ORDER — SODIUM CHLORIDE 0.9 % IV SOLN
Freq: Once | INTRAVENOUS | Status: AC
  Filled 2022-04-02: qty 250

## 2022-04-02 MED ORDER — HEPARIN SOD (PORK) LOCK FLUSH 100 UNIT/ML IV SOLN
500.0000 [IU] | Freq: Once | INTRAVENOUS | Status: AC
  Administered 2022-04-02: 500 [IU] via INTRAVENOUS
  Filled 2022-04-02: qty 5

## 2022-04-02 MED ORDER — DARBEPOETIN ALFA 300 MCG/0.6ML IJ SOSY
300.0000 ug | PREFILLED_SYRINGE | Freq: Once | INTRAMUSCULAR | Status: AC
  Administered 2022-04-02: 300 ug via SUBCUTANEOUS
  Filled 2022-04-02: qty 0.6

## 2022-04-02 NOTE — Progress Notes (Signed)
Nutrition Follow-up:  Patient with stage IV lung cancer.  Patient receiving carboplatin, pemetrexed and keytruda.  Met with patient and daughter during infusion.  Patient reports that appetite is a little bit better.  Eating whatever she wants.  Drinking premier protein shakes. Yesterday able to eat 2 pieces of toast and 2 eggs for late breakfast.  Late afternoon ate [employer] shrimp, chicken and vegetables. Denies nausea. Reflux is better.  Has issues with constipation.      Medications: reviewed  Labs: reviewed  Anthropometrics:   Weight 141 lb on 12/12  141 lb on 11/21 143 lb on 10/31 149 lb on 10/10   NUTRITION DIAGNOSIS: Increased nutrient needs continue    INTERVENTION:  Encouraged patient to prevent constipation. Has miralax and senokot to take and will start taking it daily. Continue premier protein shake    MONITORING, EVALUATION, GOAL: weight trends, intake   NEXT VISIT: ~ 6 weeks with treatment  Victoria Foley B. Zenia Resides, [place], [place] Registered Dietitian [phone]

## 2022-04-02 NOTE — Patient Instructions (Signed)
Referral faxed to [facility] in [place] per request of Dr. Darrall Dears.

## 2022-04-02 NOTE — Progress Notes (Signed)
[place] OFFICE PROGRESS NOTE  Patient Care Team: Earlie Server, MD as PCP - General (Oncology) Telford Nab, RN as Oncology Nurse Navigator  TREATMENT:  Right hip palliative RT 30 cGy completed 12/18/2021 Carboplatin, alimta and Keytruda completed 02/19/22. Started maintenance alimta, keytruda  ASSESSMENT & PLAN:  # Primary lung adenocarcinoma ([place]), Stage IV, PDL1 1% -Diagnosed 11/19/2021 after transbronchial biopsy of LUL nodule by Dr. Patsey Berthold.  Metastatic to pelvis, bilateral hips R>L and numerous pulmonary nodules. -MRI brain showed no intracranial mets. 1 cm indeterminate left frontal calvarium lesion. [place] 1 testing showed PD-L1 1%.  No other targetable mutations.  Report below  - Restaging CT chest abdomen pelvis showed good response to treatment.    - Started on maintenance Alimta and Keytruda on 03/12/2022.  Alimta was dose reduced to 250 mg/m for hematologic toxicity last cycle.  After dose reduction, platelets dropped again to 53.  Hemoglobin 7.9.  Patient has increased hematologic toxicity from prior radiation to the hip also.  I will decrease Alimta to 200 mg/m.  Obtain midcycle labs.  Continue with Neulasta and Aranesp for now.  Hold Keytruda today for elevated LFTs.  -Sought second opinion with Dr. Varney Biles at [facility] who agreed with our treatment plan.    # Chemotherapy induced thrombocytopenia -as above  # Normocytic anemia  -Likely secondary to chemotherapy and iron panel consistent with anemia of chronic disease. -Hemoglobin 7.9 today.  Aranesp today.  # Transaminitis -AST/ALT/ALP slowly trending up.  Grade 1-2 hepatotoxicity could be from immunotherapy. -I will hold Keytruda today.  Repeat LFTs in 1 week.  Hold off on prednisone for now.  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 Donella Stade on 12/18/2021 -Was seen by [place].  No surgical intervention needed.  -Patient was  made referral to home PT which was denied because she did not meet the criteria for homebound.  I am concerned with patient being unstable with her gait and high fall risk.  Would advise home services to reconsider their evaluation.  Will also make referral to [facility] physical therapy in the meantime.  #Mild difficulty swallowing -Intermittent in nature.  CT chest did not show any concerns.  # Access - port  RTC in 1 week for hydration, labs RTC in 3 weeks for MD visit, labs, Alimta and Keytruda.   Orders Placed This Encounter  Procedures   CBC with Differential    Standing Status:   Future    Standing Expiration Date:   04/02/2023   Comprehensive metabolic panel    Standing Status:   Future    Standing Expiration Date:   04/02/2023   Thyroid Panel With TSH    Standing Status:   Future    Standing Expiration Date:   04/24/2023   Thyroid Panel With TSH    Standing Status:   Future    Standing Expiration Date:   05/15/2023   CBC with Differential    Standing Status:   Future    Standing Expiration Date:   05/15/2023   Comprehensive metabolic panel    Standing Status:   Future    Standing Expiration Date:   05/15/2023   Thyroid Panel With TSH    Standing Status:   Future    Standing Expiration Date:   06/05/2023   CBC with Differential    Standing Status:   Future    Standing Expiration Date:   06/05/2023   Comprehensive  metabolic panel    Standing Status:   Future    Standing Expiration Date:   06/05/2023   [facility]- 12/06/2021   All questions were answered. The patient knows to call the clinic with any problems, questions or concerns. The total time spent in the appointment was 35 minutes encounter with patients including review of chart and various tests results, discussions about plan of care and coordination of care plan   Jane Canary, MD 04/02/2022 11:53 AM  INTERVAL HISTORY: Please see below for problem oriented charting.  Patient following with [place] for treatment  of stage IV lung adenocarcinoma.  Currently on Alimta and Keytruda maintenance.  Patient seen today accompanied by daughter to discuss CT scan and for treatment. She reports feeling well.  1 day she had pain in her right hip.  Feels more stiff now.  Denies fever, chills, nausea, vomiting, shortness of breath, chest pain, bleeding.  REVIEW OF SYSTEMS:   Positive ROS as above.  Rest 10 points ROS negative   I have reviewed the past medical history, past surgical history, social history and family history with the patient and they are unchanged from previous note.  ALLERGIES:  is allergic to diclofenac, lisinopril, and sulfa antibiotics.  MEDICATIONS:  Current Outpatient Medications  Medication Sig Dispense Refill   albuterol (VENTOLIN HFA) 108 (90 Base) MCG/ACT inhaler Inhale 2 puffs into the lungs every 6 (six) hours as needed for wheezing or shortness of breath. 18 g 0   benzonatate (TESSALON) 200 MG capsule Take 1 capsule (200 mg total) by mouth 3 (three) times daily as needed for cough. 30 capsule 0   dexamethasone (DECADRON) 4 MG tablet Starting next cycle, take 4 mg two times a day, a day prior to chemotherapy. Do not take on the day of chemotherapy. Then take for one day after chemotherapy. 20 tablet 0   fluticasone (FLONASE) 50 MCG/ACT nasal spray USE TWO SPRAYS IN EACH NOSTRIL ONCE DAILY prn 16 g 12   folic acid (FOLVITE) 1 MG tablet Take 1 tablet (1 mg total) by mouth daily. Start 7 days before pemetrexed chemotherapy. Continue until 21 days after pemetrexed completed. 100 tablet 3   lidocaine-prilocaine (EMLA) cream Apply to port site 30 mins prior using once 30 g 3   loratadine (CLARITIN) 10 MG tablet TAKE ONE TABLET BY MOUTH DAILY AS NEEDED FOR ALLERGIES 90 tablet 3   losartan (COZAAR) 100 MG tablet Take 1 tablet (100 mg total) by mouth daily. 90 tablet 3   Multiple Vitamin (ONE-DAILY MULTI-VITAMIN PO) Take by mouth daily.     nebivolol (BYSTOLIC) 2.5 MG tablet Take 1 tablet (2.5  mg total) by mouth daily. 90 tablet 3   ondansetron (ZOFRAN) 8 MG tablet Take 1 tablet (8 mg total) by mouth every 8 (eight) hours as needed for nausea or vomiting. Start on the third day after carboplatin. 30 tablet 1   Chromium Picolinate (CHROMIUM PICOLATE PO) Take by mouth daily. (Patient not taking: Reported on 01/21/2022)     Omega-3 Fatty Acids (FISH OIL) 1000 MG CAPS Take by mouth. (Patient not taking: Reported on 01/21/2022)     omeprazole (PRILOSEC) 20 MG capsule Take 1 capsule (20 mg total) by mouth daily. (Patient not taking: Reported on 01/21/2022) 10 capsule 0   prochlorperazine (COMPAZINE) 10 MG tablet Take 1 tablet (10 mg total) by mouth every 6 (six) hours as needed for nausea or vomiting. (Patient not taking: Reported on 12/18/2021) 30 tablet 1   Turmeric (QC TUMERIC  COMPLEX PO) Take 2 tablets by mouth daily. (Patient not taking: Reported on 11/28/2021)     No current facility-administered medications for this visit.   Facility-Administered Medications Ordered in Other Visits  Medication Dose Route Frequency Provider Last Rate Last Admin   heparin lock flush 100 UNIT/ML injection            heparin lock flush 100 unit/mL  500 Units Intravenous Once Jane Canary, MD       heparin lock flush 100 unit/mL  500 Units Intracatheter Once PRN Jane Canary, MD        SUMMARY OF ONCOLOGIC HISTORY: Oncology History  Primary lung adenocarcinoma ([place])  10/10/2021 Initial Diagnosis   Primary lung adenocarcinoma ([place]) Stage IV  Patient seen by PCP for wheezing for 2 weeks. Imaging with CXR followed by CT chest showed Spiculated 2.1 x 2.2 cm left upper lobe pulmonary nodule with associated pleural tethering. Innumerable subcentimeter pulmonary nodules throughout the lungs.      11/14/2021 PET scan   IMPRESSION: 1. Left suprahilar nodule with maximum SUV 4.5, and a more distal 2.2 cm left upper lobe nodule with maximum SUV of 5.5. 2. Innumerable scattered small pulmonary nodules, most  of which are under 5 mm in diameter, throughout both lungs. Some are cavitary. Possibilities include cavitating hematogenous disseminated malignancy versus infectious etiology subset as septic emboli. Most of the nodules are stable, some are minimally enlarged compared to 10/10/2021. 3. Abnormal mild permeative type bony findings in the right hemipelvis associated with substantially accentuated metabolic activity. Possible associated pathologic fracture through the right quadrilateral plate and right acetabulum. The findings involve the right ilium and ischium but also the right lateral sacral ala and a small focus in the left sacrum.    Pathology Results   She had transbronchial biopsies by Dr. Patsey Berthold on 11/19/21.  Pathology from LUL nodule showed adenocarcinoma, consistent with lung primary.    11/19/2021 Cancer Staging   Staging form: Lung, AJCC 8th Edition - Clinical stage from 11/19/2021: Stage IVB (cT1c, cM1c) - Signed by Jane Canary, MD on 11/27/2021 Stage prefix: Initial diagnosis   11/27/2021 Imaging   MRI pelvis  IMPRESSION: 1. Abnormal marrow signal throughout the right ilium involving the right acetabulum, right superior pubic ramus and right inferior pubic ramus, right side of the sacrum and a smaller focus of abnormal marrow lesion in the left side of the sacrum. Findings are concerning for metastatic disease. 2. Nondisplaced pathologic fracture of the quadrilateral plate of the right acetabulum. 3. Mild muscle edema in the right gluteus minimus and medius muscles likely reflecting mild muscle strain.  MRI brain  IMPRESSION: No evidence of intracranial metastatic disease.   Indeterminate 1 cm left frontal calvarium lesion.   12/18/2021 -  Chemotherapy   Patient is on Treatment Plan : LUNG Carboplatin (5) + Pemetrexed (500) + Pembrolizumab (200) D1 q21d Induction x 4 cycles / Maintenance Pemetrexed (500) + Pembrolizumab (200) D1 q21d       PHYSICAL  EXAMINATION: ECOG PERFORMANCE STATUS: 2 - Symptomatic, <50% confined to bed  Vitals:   04/02/22 0948  BP: 129/72  Pulse: 95  Resp: 20  Temp: 98.6 F (37 C)  SpO2: 100%    Filed Weights   04/02/22 0948  Weight: 141 lb (64 kg)     Physical Exam Constitutional:      Appearance: Normal appearance.  HENT:     Head: Normocephalic and atraumatic.  Cardiovascular:     Rate and Rhythm: Normal rate.  Pulmonary:  Effort: Pulmonary effort is normal.  Musculoskeletal:     Right lower leg: No edema.     Left lower leg: No edema.  Skin:    General: Skin is warm.  Neurological:     General: No focal deficit present.     Mental Status: She is oriented to person, place, and time.  Psychiatric:        Mood and Affect: Mood normal.     LABORATORY DATA:  I have reviewed the data as listed    Component Value Date/Time   NA 139 04/02/2022 0936   K 4.0 04/02/2022 0936   CL 113 (H) 04/02/2022 0936   CO2 19 (L) 04/02/2022 0936   GLUCOSE 106 (H) 04/02/2022 0936   BUN 29 (H) 04/02/2022 0936   CREATININE 1.50 (H) 04/02/2022 0936   CALCIUM 8.7 (L) 04/02/2022 0936   PROT 7.1 04/02/2022 0936   ALBUMIN 3.5 04/02/2022 0936   AST 92 (H) 04/02/2022 0936   ALT 153 (H) 04/02/2022 0936   ALKPHOS 171 (H) 04/02/2022 0936   BILITOT 0.6 04/02/2022 0936   GFRNONAA 37 (L) 04/02/2022 0936    No results found for: "SPEP", "UPEP"  Lab Results  Component Value Date   WBC 7.1 04/02/2022   NEUTROABS 4.8 04/02/2022   HGB 7.9 (L) 04/02/2022   HCT 24.0 (L) 04/02/2022   MCV 93.0 04/02/2022   PLT 187 04/02/2022      Chemistry      Component Value Date/Time   NA 139 04/02/2022 0936   K 4.0 04/02/2022 0936   CL 113 (H) 04/02/2022 0936   CO2 19 (L) 04/02/2022 0936   BUN 29 (H) 04/02/2022 0936   CREATININE 1.50 (H) 04/02/2022 0936      Component Value Date/Time   CALCIUM 8.7 (L) 04/02/2022 0936   ALKPHOS 171 (H) 04/02/2022 0936   AST 92 (H) 04/02/2022 0936   ALT 153 (H) 04/02/2022 0936    BILITOT 0.6 04/02/2022 0936       RADIOGRAPHIC STUDIES: I have personally reviewed the radiological images as listed and agreed with the findings in the report. CT CHEST ABDOMEN PELVIS WO CONTRAST  Result Date: 03/10/2022 CLINICAL DATA:  Non-small cell lung cancer (NSCLC), monitor, currently on chemotherapy. * Tracking Code: BO * EXAM: CT CHEST, ABDOMEN AND PELVIS WITHOUT CONTRAST TECHNIQUE: Multidetector CT imaging of the chest, abdomen and pelvis was performed following the standard protocol without IV contrast. RADIATION DOSE REDUCTION: This exam was performed according to the departmental dose-optimization program which includes automated exposure control, adjustment of the mA and/or kV according to patient size and/or use of iterative reconstruction technique. COMPARISON:  11/14/2021 chest CT and PET-CT. FINDINGS: CT CHEST FINDINGS Cardiovascular: Normal heart size. No significant pericardial effusion/thickening. Right internal jugular Port-A-Cath terminates at the cavoatrial junction. Great vessels are normal in course and caliber. Mediastinum/Nodes: No significant thyroid nodules. Unremarkable esophagus. No axillary adenopathy. No pathologically enlarged mediastinal nodes. No discrete right hilar adenopathy on these noncontrast images. Left superior hilar adenopathy has decreased from 1.1 cm to 0.7 cm (series 2/image 20). Lungs/Pleura: No pneumothorax. No pleural effusion. Irregular solid central left upper lobe 2.5 x 2.0 cm pulmonary nodule (series 3/image 40), decreased from 3.1 x 2.3 cm using similar measurement technique. Innumerable (> than 50) indistinct small pulmonary nodules scattered throughout both lungs are decreased in size. Representative 0.5 cm superior segment left lower lobe nodule (series 3/image 24), decreased from 0.8 cm. Representative peripheral right lower lobe 0.4 cm nodule (series 3/image 67),  decreased from 0.7 cm. Similar mildly irregular interlobular septal thickening  throughout both lungs with an upper lobe predominance. No appreciable new or enlarging pulmonary nodules. Musculoskeletal: Sclerotic right humeral head 1.2 cm lesion (series 3/image 29), better defined and increased in sclerosis in the interval. No additional focal osseous lesions in the chest. Mild thoracic spondylosis. CT ABDOMEN PELVIS FINDINGS Hepatobiliary: Normal liver with no liver mass. Cholelithiasis. No biliary ductal dilatation. Pancreas: Normal, with no mass or duct dilation. Spleen: Normal size spleen. Hypodense 1.7 cm superior splenic lesion (series 2/image 46), not appreciably changed. No new splenic lesions. Adrenals/Urinary Tract: No discrete adrenal nodules. No renal stones. No hydronephrosis. Simple 1.9 cm lower right renal cyst, for which no follow-up imaging is recommended. No additional contour deforming renal masses. Normal bladder. Stomach/Bowel: Normal non-distended stomach. Normal caliber small bowel with no small bowel wall thickening. Normal appendix. Normal large bowel with no diverticulosis, large bowel wall thickening or pericolonic fat stranding. Vascular/Lymphatic: Atherosclerotic nonaneurysmal abdominal aorta. No pathologically enlarged lymph nodes in the abdomen or pelvis. Reproductive: No adnexal masses.  Stable nonenlarged uterus. Other: No pneumoperitoneum, ascites or focal fluid collection. Musculoskeletal: Increased patchy abnormal sclerosis throughout the right iliac bone, lateral right sacral ala and in discrete L1, L4 and L5 vertebral lesions and left sacral ala lesions. Mild lumbar spondylosis. Partially visualized fixation rod in the proximal left femur. IMPRESSION: 1. Interval positive response to therapy. 2. Irregular solid central left upper lobe lung mass is decreased. Left suprahilar adenopathy is decreased. Innumerable small bilateral pulmonary metastases are all decreased. 3. Increased sclerosis throughout multifocal sclerotic bone metastases in the right humeral  head, lumbar spine, sacrum and right hemipelvis, compatible with treatment response. 4. No unenhanced CT evidence of new or progressive metastatic disease. 5. Chronic findings include: Stable indeterminate hypodense 1.7 cm superior splenic lesion, previously non FDG avid on PET-CT, more likely benign. Cholelithiasis. Aortic Atherosclerosis (ICD10-I70.0). Electronically Signed   By: Ilona Sorrel M.D.   On: 03/10/2022 11:24

## 2022-04-02 NOTE — Patient Instructions (Signed)
[facility]  Discharge Instructions: Thank you for choosing [facility] to provide your oncology and hematology care.  If you have a lab appointment with the Cancer Center, please go directly to the Cancer Center and check in at the registration area.  Wear comfortable clothing and clothing appropriate for easy access to any Portacath or PICC line.   We strive to give you quality time with your provider. You may need to reschedule your appointment if you arrive late (15 or more minutes).  Arriving late affects you and other patients whose appointments are after yours.  Also, if you miss three or more appointments without notifying the office, you may be dismissed from the clinic at the provider's discretion.      For prescription refill requests, have your pharmacy contact our office and allow 72 hours for refills to be completed.    Today you received the following chemotherapy and/or immunotherapy agents: Alimta      To help prevent nausea and vomiting after your treatment, we encourage you to take your nausea medication as directed.  BELOW ARE SYMPTOMS THAT SHOULD BE REPORTED IMMEDIATELY: *FEVER GREATER THAN 100.4 F (38 C) OR HIGHER *CHILLS OR SWEATING *NAUSEA AND VOMITING THAT IS NOT CONTROLLED WITH YOUR NAUSEA MEDICATION *UNUSUAL SHORTNESS OF BREATH *UNUSUAL BRUISING OR BLEEDING *URINARY PROBLEMS (pain or burning when urinating, or frequent urination) *BOWEL PROBLEMS (unusual diarrhea, constipation, pain near the anus) TENDERNESS IN MOUTH AND THROAT WITH OR WITHOUT PRESENCE OF ULCERS (sore throat, sores in mouth, or a toothache) UNUSUAL RASH, SWELLING OR PAIN  UNUSUAL VAGINAL DISCHARGE OR ITCHING   Items with * indicate a potential emergency and should be followed up as soon as possible or go to the Emergency Department if any problems should occur.  Please show the CHEMOTHERAPY ALERT CARD or IMMUNOTHERAPY ALERT CARD at check-in to the  Emergency Department and triage nurse.  Should you have questions after your visit or need to cancel or reschedule your appointment, please contact [facility]  [phone] and follow the prompts.  Office hours are 8:00 a.m. to 4:30 p.m. Monday - Friday. Please note that voicemails left after 4:00 p.m. may not be returned until the following business day.  We are closed weekends and major holidays. You have access to a nurse at all times for urgent questions. Please call the main number to the clinic [phone] and follow the prompts.  For any non-urgent questions, you may also contact your provider using MyChart. We now offer e-Visits for anyone 18 and older to request care online for non-urgent symptoms. For details visit [email or web address].   Also download the MyChart app! Go to the app store, search "MyChart", open the app, select [place], and log in with your MyChart username and password.  Masks are optional in the cancer centers. If you would like for your care team to wear a mask while they are taking care of you, please let them know. For doctor visits, patients may have with them one support person who is at least 70 years old. At this time, visitors are not allowed in the infusion area.   

## 2022-04-02 NOTE — Progress Notes (Signed)
WBC 7.1 Hemoglobin 7.9 ANC 4.8 AST 92 ALT 153 Per Dr. Darrall Dears hold Beryle Flock, proceed with dose reduced (200mg /m2) Alimta. Pt to also receive Aranesp and Neulasta On Pro.

## 2022-04-02 NOTE — Progress Notes (Signed)
Patient states she has been itching on her back.Patient states the itching started on Wednesday.  Patient denies a rash.  Patient daughter states they have concerns about mobility due patient having stairs to get acces to her car. Patient daughter states they sent someone out for PT but was denied because she is not home bound. Patient daughter states they don't feel comfortable with her being a fall risk.

## 2022-04-03 ENCOUNTER — Other Ambulatory Visit: Payer: Self-pay

## 2022-04-03 LAB — THYROID PANEL WITH TSH
Free Thyroxine Index: 2 (ref 1.2–4.9)
T3 Uptake Ratio: 26 % (ref 24–39)
T4, Total: 7.8 ug/dL (ref 4.5–12.0)
TSH: 2.7 u[IU]/mL (ref 0.450–4.500)

## 2022-04-04 ENCOUNTER — Other Ambulatory Visit: Payer: Self-pay

## 2022-04-07 ENCOUNTER — Other Ambulatory Visit: Payer: Self-pay

## 2022-04-09 ENCOUNTER — Inpatient Hospital Stay: Payer: PPO

## 2022-04-09 ENCOUNTER — Other Ambulatory Visit: Payer: Self-pay

## 2022-04-09 DIAGNOSIS — Z5111 Encounter for antineoplastic chemotherapy: Secondary | ICD-10-CM | POA: Diagnosis not present

## 2022-04-09 DIAGNOSIS — C3490 Malignant neoplasm of unspecified part of unspecified bronchus or lung: Secondary | ICD-10-CM

## 2022-04-09 LAB — CBC WITH DIFFERENTIAL/PLATELET
Abs Immature Granulocytes: 0.37 10*3/uL — ABNORMAL HIGH (ref 0.00–0.07)
Basophils Absolute: 0 10*3/uL (ref 0.0–0.1)
Basophils Relative: 1 %
Eosinophils Absolute: 0.1 10*3/uL (ref 0.0–0.5)
Eosinophils Relative: 1 %
HCT: 22 % — ABNORMAL LOW (ref 36.0–46.0)
Hemoglobin: 7.3 g/dL — ABNORMAL LOW (ref 12.0–15.0)
Immature Granulocytes: 5 %
Lymphocytes Relative: 12 %
Lymphs Abs: 0.9 10*3/uL (ref 0.7–4.0)
MCH: 31.1 pg (ref 26.0–34.0)
MCHC: 33.2 g/dL (ref 30.0–36.0)
MCV: 93.6 fL (ref 80.0–100.0)
Monocytes Absolute: 0.4 10*3/uL (ref 0.1–1.0)
Monocytes Relative: 5 %
Neutro Abs: 5.4 10*3/uL (ref 1.7–7.7)
Neutrophils Relative %: 76 %
Platelets: 80 10*3/uL — ABNORMAL LOW (ref 150–400)
RBC: 2.35 MIL/uL — ABNORMAL LOW (ref 3.87–5.11)
RDW: 19.8 % — ABNORMAL HIGH (ref 11.5–15.5)
Smear Review: NORMAL
WBC: 7.2 10*3/uL (ref 4.0–10.5)
nRBC: 0 % (ref 0.0–0.2)

## 2022-04-09 LAB — COMPREHENSIVE METABOLIC PANEL
ALT: 79 U/L — ABNORMAL HIGH (ref 0–44)
AST: 60 U/L — ABNORMAL HIGH (ref 15–41)
Albumin: 3.6 g/dL (ref 3.5–5.0)
Alkaline Phosphatase: 197 U/L — ABNORMAL HIGH (ref 38–126)
Anion gap: 8 (ref 5–15)
BUN: 31 mg/dL — ABNORMAL HIGH (ref 8–23)
CO2: 19 mmol/L — ABNORMAL LOW (ref 22–32)
Calcium: 8.8 mg/dL — ABNORMAL LOW (ref 8.9–10.3)
Chloride: 111 mmol/L (ref 98–111)
Creatinine, Ser: 1.92 mg/dL — ABNORMAL HIGH (ref 0.44–1.00)
GFR, Estimated: 28 mL/min — ABNORMAL LOW (ref >60)
Glucose, Bld: 113 mg/dL — ABNORMAL HIGH (ref 70–99)
Potassium: 4 mmol/L (ref 3.5–5.1)
Sodium: 138 mmol/L (ref 135–145)
Total Bilirubin: 0.8 mg/dL (ref 0.3–1.2)
Total Protein: 7.4 g/dL (ref 6.5–8.1)

## 2022-04-09 NOTE — Progress Notes (Signed)
Patient refused fluids today stated she had a funeral to attend, received lab only today.

## 2022-04-10 ENCOUNTER — Telehealth: Payer: Self-pay | Admitting: Internal Medicine

## 2022-04-10 ENCOUNTER — Telehealth: Payer: Self-pay | Admitting: *Deleted

## 2022-04-10 DIAGNOSIS — D649 Anemia, unspecified: Secondary | ICD-10-CM

## 2022-04-10 DIAGNOSIS — N179 Acute kidney failure, unspecified: Secondary | ICD-10-CM

## 2022-04-10 NOTE — Telephone Encounter (Signed)
Pt on maintenance alimta and [place].   Midcycle labs reviewed. Cr uptrended 1.5 to 1.9. Baseline 1.3-1.5. Pt did not do hydration yesterday due to personal commitment.   Requested the staff to call the pt to come for 1L hydration, urine test .  Follow up in 1 week with APP, labs, hydration, hold tube.   Orders Placed This Encounter  Procedures   Protein / Creatinine Ratio, Urine   Sodium, urine, random   CBC with Differential   Comprehensive metabolic panel   Hold Tube- Blood Bank

## 2022-04-10 NOTE — Telephone Encounter (Signed)
Message received from Dr. Darrall Dears regarding lab results from 12/19. Per Dr. Darrall Dears patients creatinine is trending up at this time and patient would benefit from IVF as patient was unable to stay for IVF on 12/19 as scheduled. Patient called and advised of recommendations. Patient scheduled for IVF on 12/21 and lab/APP/Hydration on Wednesday 12/27. Patient verbalized understanding of plan. Scheduling reached out to patient to confirm appointments.

## 2022-04-11 ENCOUNTER — Inpatient Hospital Stay: Payer: PPO

## 2022-04-11 ENCOUNTER — Other Ambulatory Visit: Payer: Self-pay

## 2022-04-11 VITALS — BP 123/71 | HR 98 | Temp 99.2°F | Resp 20

## 2022-04-11 DIAGNOSIS — N179 Acute kidney failure, unspecified: Secondary | ICD-10-CM

## 2022-04-11 DIAGNOSIS — C3490 Malignant neoplasm of unspecified part of unspecified bronchus or lung: Secondary | ICD-10-CM

## 2022-04-11 DIAGNOSIS — Z5111 Encounter for antineoplastic chemotherapy: Secondary | ICD-10-CM | POA: Diagnosis not present

## 2022-04-11 LAB — SODIUM, URINE, RANDOM: Sodium, Ur: 50 mmol/L

## 2022-04-11 LAB — PROTEIN / CREATININE RATIO, URINE
Creatinine, Urine: 60 mg/dL
Protein Creatinine Ratio: 0.23 mg/mg{Cre} — ABNORMAL HIGH (ref 0.00–0.15)
Total Protein, Urine: 14 mg/dL

## 2022-04-11 MED ORDER — SODIUM CHLORIDE 0.9 % IV SOLN
Freq: Once | INTRAVENOUS | Status: AC
  Filled 2022-04-11: qty 250

## 2022-04-11 MED ORDER — SODIUM CHLORIDE 0.9% FLUSH
10.0000 mL | Freq: Once | INTRAVENOUS | Status: AC | PRN
  Administered 2022-04-11: 10 mL
  Filled 2022-04-11: qty 10

## 2022-04-11 MED ORDER — HEPARIN SOD (PORK) LOCK FLUSH 100 UNIT/ML IV SOLN
500.0000 [IU] | Freq: Once | INTRAVENOUS | Status: AC | PRN
  Administered 2022-04-11: 500 [IU]
  Filled 2022-04-11: qty 5

## 2022-04-17 ENCOUNTER — Encounter: Payer: Self-pay | Admitting: Medical Oncology

## 2022-04-17 ENCOUNTER — Inpatient Hospital Stay (HOSPITAL_BASED_OUTPATIENT_CLINIC_OR_DEPARTMENT_OTHER): Payer: PPO | Admitting: Medical Oncology

## 2022-04-17 ENCOUNTER — Inpatient Hospital Stay: Payer: PPO

## 2022-04-17 ENCOUNTER — Telehealth: Payer: Self-pay | Admitting: Medical Oncology

## 2022-04-17 VITALS — BP 131/78 | HR 99 | Temp 97.5°F | Resp 18

## 2022-04-17 DIAGNOSIS — D649 Anemia, unspecified: Secondary | ICD-10-CM

## 2022-04-17 DIAGNOSIS — R7401 Elevation of levels of liver transaminase levels: Secondary | ICD-10-CM

## 2022-04-17 DIAGNOSIS — N179 Acute kidney failure, unspecified: Secondary | ICD-10-CM | POA: Diagnosis not present

## 2022-04-17 DIAGNOSIS — D72819 Decreased white blood cell count, unspecified: Secondary | ICD-10-CM | POA: Diagnosis not present

## 2022-04-17 DIAGNOSIS — C3490 Malignant neoplasm of unspecified part of unspecified bronchus or lung: Secondary | ICD-10-CM

## 2022-04-17 DIAGNOSIS — Z5111 Encounter for antineoplastic chemotherapy: Secondary | ICD-10-CM | POA: Diagnosis not present

## 2022-04-17 LAB — CBC WITH DIFFERENTIAL/PLATELET
Abs Immature Granulocytes: 0.89 10*3/uL — ABNORMAL HIGH (ref 0.00–0.07)
Basophils Absolute: 0 10*3/uL (ref 0.0–0.1)
Basophils Relative: 0 %
Eosinophils Absolute: 0.2 10*3/uL (ref 0.0–0.5)
Eosinophils Relative: 1 %
HCT: 21 % — ABNORMAL LOW (ref 36.0–46.0)
Hemoglobin: 6.7 g/dL — ABNORMAL LOW (ref 12.0–15.0)
Immature Granulocytes: 6 %
Lymphocytes Relative: 10 %
Lymphs Abs: 1.4 10*3/uL (ref 0.7–4.0)
MCH: 31.9 pg (ref 26.0–34.0)
MCHC: 31.9 g/dL (ref 30.0–36.0)
MCV: 100 fL (ref 80.0–100.0)
Monocytes Absolute: 0.9 10*3/uL (ref 0.1–1.0)
Monocytes Relative: 7 %
Neutro Abs: 10.5 10*3/uL — ABNORMAL HIGH (ref 1.7–7.7)
Neutrophils Relative %: 76 %
Platelets: 126 10*3/uL — ABNORMAL LOW (ref 150–400)
RBC: 2.1 MIL/uL — ABNORMAL LOW (ref 3.87–5.11)
RDW: 24.6 % — ABNORMAL HIGH (ref 11.5–15.5)
Smear Review: NORMAL
WBC: 13.9 10*3/uL — ABNORMAL HIGH (ref 4.0–10.5)
nRBC: 0.6 % — ABNORMAL HIGH (ref 0.0–0.2)

## 2022-04-17 LAB — COMPREHENSIVE METABOLIC PANEL
ALT: 88 U/L — ABNORMAL HIGH (ref 0–44)
AST: 83 U/L — ABNORMAL HIGH (ref 15–41)
Albumin: 3.3 g/dL — ABNORMAL LOW (ref 3.5–5.0)
Alkaline Phosphatase: 194 U/L — ABNORMAL HIGH (ref 38–126)
Anion gap: 6 (ref 5–15)
BUN: 21 mg/dL (ref 8–23)
CO2: 20 mmol/L — ABNORMAL LOW (ref 22–32)
Calcium: 8.5 mg/dL — ABNORMAL LOW (ref 8.9–10.3)
Chloride: 112 mmol/L — ABNORMAL HIGH (ref 98–111)
Creatinine, Ser: 1.6 mg/dL — ABNORMAL HIGH (ref 0.44–1.00)
GFR, Estimated: 34 mL/min — ABNORMAL LOW (ref >60)
Glucose, Bld: 104 mg/dL — ABNORMAL HIGH (ref 70–99)
Potassium: 3.8 mmol/L (ref 3.5–5.1)
Sodium: 138 mmol/L (ref 135–145)
Total Bilirubin: 0.5 mg/dL (ref 0.3–1.2)
Total Protein: 6.8 g/dL (ref 6.5–8.1)

## 2022-04-17 LAB — PREPARE RBC (CROSSMATCH)

## 2022-04-17 MED ORDER — SODIUM CHLORIDE 0.9% FLUSH
10.0000 mL | Freq: Once | INTRAVENOUS | Status: AC
  Filled 2022-04-17: qty 10

## 2022-04-17 MED ORDER — HEPARIN SOD (PORK) LOCK FLUSH 100 UNIT/ML IV SOLN
500.0000 [IU] | Freq: Once | INTRAVENOUS | Status: AC
  Filled 2022-04-17: qty 5

## 2022-04-17 NOTE — Progress Notes (Signed)
Symptom Management [place] at [facility] Telephone:(336) [phone] Fax:(336) [phone]  Patient Care Team: Earlie Server, MD as PCP - General (Oncology) Telford Nab, RN as Oncology Nurse Navigator   Name of the patient: Victoria Foley  [id number]  06/09/1950   Date of visit: 04/17/22  Reason for Consult: Victoria Foley is a 71 y.o. female currently being treated by Dr. Tasia Catchings for primary adenocarcinoma of lung. S/p carboplatin/amilta/keytruda (02/19/2022), palliative RT 30 cGy which she completed on 12/18/2021 who is now on maintenance Alimta/keytruda(started on 03/12/2022). who presents today for:  Follow up on lab abnormalities: Patient was found to have elevated creatinine and elevated LFTs suspected to be secondary to her Alimta/Keytruda on 04/09/2022. Her creatinine at this time was 1.92 with previous creatinine a week earlier being 1.5. Her highest LFT levels were on 04/02/2022 with values of AST 92, ALT 153, Alk Phos 171.   Her Beryle Flock was held at her last visit on 04/02/2022 given the concern for the lab abnormalities. Since this time she has been monitored closely with labs and fluids as needed. At her last visit on 04/09/2022 her LFTS had begun to trend down with values of AST 60, ALT 79, Alk Phos 197.   Her Alimta has been dose reduced from 500 (02/19/2022) to 250 (03/12/2022), to 200 (12/12/223). Her last treatment was on 04/02/2022.   Today she reports that she feels a bit tired but overall is feeling ok. She had a nice Christmas with family. She offers no concerns today. She is eating and drinking well.    Denies any neurologic complaints. Denies recent fevers or illnesses. Denies any easy bleeding or bruising. Reports good appetite and denies weight loss. Denies chest pain. Denies any nausea, vomiting, constipation, or diarrhea. Denies urinary complaints. Patient offers no further specific complaints today.    PAST MEDICAL HISTORY: Past  Medical History:  Diagnosis Date   Anemia    Anxiety    Arthritis    COVID-19    03/2021   Hypertension    Pneumonia    Pre-diabetes    Prediabetes    Sciatica     PAST SURGICAL HISTORY:  Past Surgical History:  Procedure Laterality Date   CESAREAN SECTION     COLONOSCOPY W/ POLYPECTOMY     ECTOPIC PREGNANCY SURGERY     FEMUR SURGERY     left, s/p rod for fracture   IR IMAGING GUIDED PORT INSERTION  12/11/2021   TUBAL LIGATION      HEMATOLOGY/ONCOLOGY HISTORY:  Oncology History  Primary lung adenocarcinoma ([place])  10/10/2021 Initial Diagnosis   Primary lung adenocarcinoma ([place]) Stage IV  Patient seen by PCP for wheezing for 2 weeks. Imaging with CXR followed by CT chest showed Spiculated 2.1 x 2.2 cm left upper lobe pulmonary nodule with associated pleural tethering. Innumerable subcentimeter pulmonary nodules throughout the lungs.      11/14/2021 PET scan   IMPRESSION: 1. Left suprahilar nodule with maximum SUV 4.5, and a more distal 2.2 cm left upper lobe nodule with maximum SUV of 5.5. 2. Innumerable scattered small pulmonary nodules, most of which are under 5 mm in diameter, throughout both lungs. Some are cavitary. Possibilities include cavitating hematogenous disseminated malignancy versus infectious etiology subset as septic emboli. Most of the nodules are stable, some are minimally enlarged compared to 10/10/2021. 3. Abnormal mild permeative type bony findings in the right hemipelvis associated with substantially accentuated metabolic activity. Possible associated pathologic fracture through the right quadrilateral plate and  right acetabulum. The findings involve the right ilium and ischium but also the right lateral sacral ala and a small focus in the left sacrum.    Pathology Results   She had transbronchial biopsies by Dr. Patsey Berthold on 11/19/21.  Pathology from LUL nodule showed adenocarcinoma, consistent with lung primary.    11/19/2021 Cancer Staging    Staging form: Lung, AJCC 8th Edition - Clinical stage from 11/19/2021: Stage IVB (cT1c, cM1c) - Signed by Jane Canary, MD on 11/27/2021 Stage prefix: Initial diagnosis   11/27/2021 Imaging   MRI pelvis  IMPRESSION: 1. Abnormal marrow signal throughout the right ilium involving the right acetabulum, right superior pubic ramus and right inferior pubic ramus, right side of the sacrum and a smaller focus of abnormal marrow lesion in the left side of the sacrum. Findings are concerning for metastatic disease. 2. Nondisplaced pathologic fracture of the quadrilateral plate of the right acetabulum. 3. Mild muscle edema in the right gluteus minimus and medius muscles likely reflecting mild muscle strain.  MRI brain  IMPRESSION: No evidence of intracranial metastatic disease.   Indeterminate 1 cm left frontal calvarium lesion.   12/18/2021 -  Chemotherapy   Patient is on Treatment Plan : LUNG Carboplatin (5) + Pemetrexed (500) + Pembrolizumab (200) D1 q21d Induction x 4 cycles / Maintenance Pemetrexed (500) + Pembrolizumab (200) D1 q21d       ALLERGIES:  is allergic to diclofenac, lisinopril, and sulfa antibiotics.  MEDICATIONS:  Current Outpatient Medications  Medication Sig Dispense Refill   albuterol (VENTOLIN HFA) 108 (90 Base) MCG/ACT inhaler Inhale 2 puffs into the lungs every 6 (six) hours as needed for wheezing or shortness of breath. 18 g 0   Azelastine HCl 137 MCG/SPRAY SOLN Place 2 sprays into both nostrils 2 (two) times daily.     dexamethasone (DECADRON) 4 MG tablet Starting next cycle, take 4 mg two times a day, a day prior to chemotherapy. Do not take on the day of chemotherapy. Then take for one day after chemotherapy. 20 tablet 0   folic acid (FOLVITE) 1 MG tablet Take 1 tablet (1 mg total) by mouth daily. Start 7 days before pemetrexed chemotherapy. Continue until 21 days after pemetrexed completed. 100 tablet 3   lidocaine-prilocaine (EMLA) cream Apply to port site 30 mins  prior using once 30 g 3   losartan (COZAAR) 100 MG tablet Take 1 tablet (100 mg total) by mouth daily. 90 tablet 3   nebivolol (BYSTOLIC) 2.5 MG tablet Take 1 tablet (2.5 mg total) by mouth daily. 90 tablet 3   benzonatate (TESSALON) 200 MG capsule Take 1 capsule (200 mg total) by mouth 3 (three) times daily as needed for cough. (Patient not taking: Reported on 04/17/2022) 30 capsule 0   Chromium Picolinate (CHROMIUM PICOLATE PO) Take by mouth daily. (Patient not taking: Reported on 01/21/2022)     fluticasone (FLONASE) 50 MCG/ACT nasal spray USE TWO SPRAYS IN EACH NOSTRIL ONCE DAILY prn (Patient not taking: Reported on 04/17/2022) 16 g 12   loratadine (CLARITIN) 10 MG tablet TAKE ONE TABLET BY MOUTH DAILY AS NEEDED FOR ALLERGIES (Patient not taking: Reported on 04/17/2022) 90 tablet 3   Multiple Vitamin (ONE-DAILY MULTI-VITAMIN PO) Take by mouth daily. (Patient not taking: Reported on 04/17/2022)     Omega-3 Fatty Acids (FISH OIL) 1000 MG CAPS Take by mouth. (Patient not taking: Reported on 01/21/2022)     omeprazole (PRILOSEC) 20 MG capsule Take 1 capsule (20 mg total) by mouth daily. (Patient not taking:  Reported on 01/21/2022) 10 capsule 0   ondansetron (ZOFRAN) 8 MG tablet Take 1 tablet (8 mg total) by mouth every 8 (eight) hours as needed for nausea or vomiting. Start on the third day after carboplatin. (Patient not taking: Reported on 04/17/2022) 30 tablet 1   prochlorperazine (COMPAZINE) 10 MG tablet Take 1 tablet (10 mg total) by mouth every 6 (six) hours as needed for nausea or vomiting. (Patient not taking: Reported on 12/18/2021) 30 tablet 1   Turmeric (QC TUMERIC COMPLEX PO) Take 2 tablets by mouth daily. (Patient not taking: Reported on 11/28/2021)     No current facility-administered medications for this visit.   Facility-Administered Medications Ordered in Other Visits  Medication Dose Route Frequency Provider Last Rate Last Admin   heparin lock flush 100 UNIT/ML injection             heparin lock flush 100 unit/mL  500 Units Intravenous Once Jane Canary, MD       sodium chloride flush (NS) 0.9 % injection 10 mL  10 mL Intravenous Once Jane Canary, MD        VITAL SIGNS: BP 131/78 (BP Location: Right Arm, Patient Position: Sitting)   Pulse 99   Temp (!) 97.5 F (36.4 C) (Tympanic)   Resp 18   SpO2 100%  There were no vitals filed for this visit.  Estimated body mass index is 28 kg/m as calculated from the following:   Height as of 03/12/22: 4' 11.5" (1.511 m).   Weight as of 04/02/22: 141 lb (64 kg).  LABS: CBC:    Component Value Date/Time   WBC 13.9 (H) 04/17/2022 1253   HGB 6.7 (L) 04/17/2022 1253   HCT 21.0 (L) 04/17/2022 1253   PLT 126 (L) 04/17/2022 1253   MCV 100.0 04/17/2022 1253   NEUTROABS PENDING 04/17/2022 1253   LYMPHSABS PENDING 04/17/2022 1253   MONOABS PENDING 04/17/2022 1253   EOSABS PENDING 04/17/2022 1253   BASOSABS PENDING 04/17/2022 1253   Comprehensive Metabolic Panel:    Component Value Date/Time   NA 138 04/17/2022 1253   K 3.8 04/17/2022 1253   CL 112 (H) 04/17/2022 1253   CO2 20 (L) 04/17/2022 1253   BUN 21 04/17/2022 1253   CREATININE 1.60 (H) 04/17/2022 1253   GLUCOSE 104 (H) 04/17/2022 1253   CALCIUM 8.5 (L) 04/17/2022 1253   AST 83 (H) 04/17/2022 1253   ALT 88 (H) 04/17/2022 1253   ALKPHOS 194 (H) 04/17/2022 1253   BILITOT 0.5 04/17/2022 1253   PROT 6.8 04/17/2022 1253   ALBUMIN 3.3 (L) 04/17/2022 1253    RADIOGRAPHIC STUDIES: No results found.  PERFORMANCE STATUS (ECOG) : 1 - Symptomatic but completely ambulatory  Review of Systems Unless otherwise noted, a complete review of systems is negative.  Physical Exam General: NAD HEENT: Conjunctiva with mild to moderate pallor Cardiovascular: regular rate and rhythm Pulmonary: clear ant fields Abdomen: soft, nontender, + bowel sounds GU: no suprapubic tenderness Extremities: no edema, no joint deformities Skin: no rashes Neurological: Weakness but  otherwise nonfocal  Assessment and Plan- Patient is a 71 y.o. female  Encounter Diagnoses  Name Primary?   AKI (acute kidney injury) ([place]) Yes   Primary adenocarcinoma of lung, unspecified laterality ([place])    Anemia, unspecified type    Leukopenia, unspecified type    Transaminitis    AKI: Acute on chronic creatinine elevation which is improved as of today (1.6 from 1.93 on 04/09/2022). Improved with IVF and stopping [place]. Will continue to  monitor. Slow 500 ml fluid given today while CBC pending- stopped when CBC resulted. Continue holding Keytruda.   Anemia: Chronic in nature. Secondary to her AKI, Alimta/possibly keytruda. Hgb trending down with value of 6.7 today. Transfusion of 1 unit of blood tomorrow. She will need labs/SMC early next week.   Leukopenia: Chronic in nature likely secondary to her Alimta. Today her values today are shown below.  Lab Results  Component Value Date   WBC 13.9 (H) 04/17/2022   HGB 6.7 (L) 04/17/2022   HCT 21.0 (L) 04/17/2022   MCV 100.0 04/17/2022   PLT 126 (L) 04/17/2022    Transaminitis: New and thought to be secondary to her Keytruda. Improving slowly. She will continue to avoid tylenol, ETOH. We will continue to monitor and continue holding Keytruda at this time.  Lab Results  Component Value Date   ALT 88 (H) 04/17/2022   AST 83 (H) 04/17/2022   GGT 23 10/05/2021   ALKPHOS 194 (H) 04/17/2022   BILITOT 0.5 04/17/2022    Disposition: ~250 ml IVF today 1 unit PRBC tomorrow  RTC next week for [facility], labs +_ fluids/electrolytes   Patient expressed understanding and was in agreement with this plan. She also understands that She can call clinic at any time with any questions, concerns, or complaints.   Thank you for allowing me to participate in the care of this very pleasant patient.   Time Total: 25  Visit consisted of counseling and education dealing with the complex and emotionally intense issues of symptom management in the setting  of serious illness.Greater than 50%  of this time was spent counseling and coordinating care related to the above assessment and plan.  Signed by: Nelwyn Salisbury, PA-C

## 2022-04-17 NOTE — Telephone Encounter (Signed)
Spoke with pt in regards to scheduling per LOS 12/27.[name] Pt confirmed appts needed.

## 2022-04-18 ENCOUNTER — Inpatient Hospital Stay: Payer: PPO

## 2022-04-18 ENCOUNTER — Ambulatory Visit: Payer: PPO

## 2022-04-18 DIAGNOSIS — Z5111 Encounter for antineoplastic chemotherapy: Secondary | ICD-10-CM | POA: Diagnosis not present

## 2022-04-18 DIAGNOSIS — D649 Anemia, unspecified: Secondary | ICD-10-CM

## 2022-04-18 MED ORDER — HEPARIN SOD (PORK) LOCK FLUSH 100 UNIT/ML IV SOLN
500.0000 [IU] | Freq: Every day | INTRAVENOUS | Status: AC | PRN
  Administered 2022-04-18: 500 [IU]
  Filled 2022-04-18: qty 5

## 2022-04-18 MED ORDER — SODIUM CHLORIDE 0.9% IV SOLUTION
250.0000 mL | Freq: Once | INTRAVENOUS | Status: AC
  Administered 2022-04-18: 250 mL via INTRAVENOUS
  Filled 2022-04-18: qty 250

## 2022-04-18 MED ORDER — ACETAMINOPHEN 325 MG PO TABS
650.0000 mg | ORAL_TABLET | Freq: Once | ORAL | Status: AC
  Administered 2022-04-18: 650 mg via ORAL
  Filled 2022-04-18: qty 2

## 2022-04-18 MED ORDER — DIPHENHYDRAMINE HCL 50 MG/ML IJ SOLN
25.0000 mg | Freq: Once | INTRAMUSCULAR | Status: AC
  Administered 2022-04-18: 25 mg via INTRAVENOUS
  Filled 2022-04-18: qty 1

## 2022-04-18 NOTE — Progress Notes (Signed)
Nutrition Follow-up:   Patient with stage IV lung cancer.  Patient receiving carboplatin, pemetrexed, holding [place].    Met with patient during infusion today.  Patient reports that appetite is better. Patient eating 3 meals a day and including good sources of protein.  Reports that her heartburn has resolved. Denies any nutrition impact symptoms.    Medications: reviewed  Labs: reviewed  Anthropometrics:   Weight 141 lb on 12/12 141 lb on 11/21 143 lb on 10/31 149 lb on 10/10   NUTRITION DIAGNOSIS: Increased nutrient needs continue    INTERVENTION:  Continue good sources of protein and well balanced diet.     MONITORING, EVALUATION, GOAL: weight trends, intake   NEXT VISIT: Tuesday, Jan 23 during infusion  Rachna Schonberger B. Zenia Resides, [place], [place] Registered Dietitian [phone]

## 2022-04-19 LAB — BPAM RBC
Blood Product Expiration Date: 202401312359
ISSUE DATE / TIME: 202312280914
Unit Type and Rh: 5100

## 2022-04-19 LAB — TYPE AND SCREEN
ABO/RH(D): O NEG
Antibody Screen: NEGATIVE
Unit division: 0

## 2022-04-23 ENCOUNTER — Inpatient Hospital Stay: Payer: PPO

## 2022-04-23 ENCOUNTER — Encounter: Payer: Self-pay | Admitting: Internal Medicine

## 2022-04-23 ENCOUNTER — Inpatient Hospital Stay: Payer: PPO | Attending: Internal Medicine | Admitting: Internal Medicine

## 2022-04-23 VITALS — BP 147/78 | HR 95 | Temp 96.4°F | Resp 20 | Wt 142.6 lb

## 2022-04-23 DIAGNOSIS — C3490 Malignant neoplasm of unspecified part of unspecified bronchus or lung: Secondary | ICD-10-CM

## 2022-04-23 DIAGNOSIS — N179 Acute kidney failure, unspecified: Secondary | ICD-10-CM

## 2022-04-23 DIAGNOSIS — R1310 Dysphagia, unspecified: Secondary | ICD-10-CM | POA: Diagnosis not present

## 2022-04-23 DIAGNOSIS — R609 Edema, unspecified: Secondary | ICD-10-CM | POA: Diagnosis not present

## 2022-04-23 DIAGNOSIS — C3412 Malignant neoplasm of upper lobe, left bronchus or lung: Secondary | ICD-10-CM | POA: Diagnosis not present

## 2022-04-23 DIAGNOSIS — C7951 Secondary malignant neoplasm of bone: Secondary | ICD-10-CM | POA: Diagnosis not present

## 2022-04-23 DIAGNOSIS — D6481 Anemia due to antineoplastic chemotherapy: Secondary | ICD-10-CM

## 2022-04-23 DIAGNOSIS — R7401 Elevation of levels of liver transaminase levels: Secondary | ICD-10-CM

## 2022-04-23 DIAGNOSIS — R918 Other nonspecific abnormal finding of lung field: Secondary | ICD-10-CM | POA: Diagnosis not present

## 2022-04-23 DIAGNOSIS — T451X5A Adverse effect of antineoplastic and immunosuppressive drugs, initial encounter: Secondary | ICD-10-CM | POA: Diagnosis not present

## 2022-04-23 DIAGNOSIS — Z79899 Other long term (current) drug therapy: Secondary | ICD-10-CM | POA: Diagnosis not present

## 2022-04-23 DIAGNOSIS — D6959 Other secondary thrombocytopenia: Secondary | ICD-10-CM | POA: Diagnosis not present

## 2022-04-23 DIAGNOSIS — Z7952 Long term (current) use of systemic steroids: Secondary | ICD-10-CM | POA: Insufficient documentation

## 2022-04-23 DIAGNOSIS — D649 Anemia, unspecified: Secondary | ICD-10-CM | POA: Insufficient documentation

## 2022-04-23 DIAGNOSIS — Z5112 Encounter for antineoplastic immunotherapy: Secondary | ICD-10-CM

## 2022-04-23 LAB — CBC WITH DIFFERENTIAL/PLATELET
Abs Immature Granulocytes: 0.18 10*3/uL — ABNORMAL HIGH (ref 0.00–0.07)
Basophils Absolute: 0 10*3/uL (ref 0.0–0.1)
Basophils Relative: 0 %
Eosinophils Absolute: 0 10*3/uL (ref 0.0–0.5)
Eosinophils Relative: 0 %
HCT: 29.8 % — ABNORMAL LOW (ref 36.0–46.0)
Hemoglobin: 10 g/dL — ABNORMAL LOW (ref 12.0–15.0)
Immature Granulocytes: 2 %
Lymphocytes Relative: 7 %
Lymphs Abs: 0.9 10*3/uL (ref 0.7–4.0)
MCH: 32.4 pg (ref 26.0–34.0)
MCHC: 33.6 g/dL (ref 30.0–36.0)
MCV: 96.4 fL (ref 80.0–100.0)
Monocytes Absolute: 0.4 10*3/uL (ref 0.1–1.0)
Monocytes Relative: 4 %
Neutro Abs: 10.5 10*3/uL — ABNORMAL HIGH (ref 1.7–7.7)
Neutrophils Relative %: 87 %
Platelets: 198 10*3/uL (ref 150–400)
RBC: 3.09 MIL/uL — ABNORMAL LOW (ref 3.87–5.11)
RDW: 22.7 % — ABNORMAL HIGH (ref 11.5–15.5)
WBC: 12 10*3/uL — ABNORMAL HIGH (ref 4.0–10.5)
nRBC: 0 % (ref 0.0–0.2)

## 2022-04-23 LAB — COMPREHENSIVE METABOLIC PANEL
ALT: 64 U/L — ABNORMAL HIGH (ref 0–44)
AST: 59 U/L — ABNORMAL HIGH (ref 15–41)
Albumin: 3.4 g/dL — ABNORMAL LOW (ref 3.5–5.0)
Alkaline Phosphatase: 187 U/L — ABNORMAL HIGH (ref 38–126)
Anion gap: 10 (ref 5–15)
BUN: 38 mg/dL — ABNORMAL HIGH (ref 8–23)
CO2: 17 mmol/L — ABNORMAL LOW (ref 22–32)
Calcium: 8.7 mg/dL — ABNORMAL LOW (ref 8.9–10.3)
Chloride: 109 mmol/L (ref 98–111)
Creatinine, Ser: 1.81 mg/dL — ABNORMAL HIGH (ref 0.44–1.00)
GFR, Estimated: 30 mL/min — ABNORMAL LOW (ref >60)
Glucose, Bld: 157 mg/dL — ABNORMAL HIGH (ref 70–99)
Potassium: 3.9 mmol/L (ref 3.5–5.1)
Sodium: 136 mmol/L (ref 135–145)
Total Bilirubin: 0.5 mg/dL (ref 0.3–1.2)
Total Protein: 7.4 g/dL (ref 6.5–8.1)

## 2022-04-23 MED ORDER — HEPARIN SOD (PORK) LOCK FLUSH 100 UNIT/ML IV SOLN
INTRAVENOUS | Status: AC
  Administered 2022-04-23: 500 [IU]
  Filled 2022-04-23: qty 5

## 2022-04-23 MED ORDER — SODIUM CHLORIDE 0.9 % IV SOLN
Freq: Once | INTRAVENOUS | Status: AC
  Filled 2022-04-23: qty 250

## 2022-04-23 MED ORDER — PROCHLORPERAZINE MALEATE 10 MG PO TABS
10.0000 mg | ORAL_TABLET | Freq: Once | ORAL | Status: AC
  Administered 2022-04-23: 10 mg via ORAL
  Filled 2022-04-23: qty 1

## 2022-04-23 MED ORDER — HEPARIN SOD (PORK) LOCK FLUSH 100 UNIT/ML IV SOLN
500.0000 [IU] | Freq: Once | INTRAVENOUS | Status: AC | PRN
  Filled 2022-04-23: qty 5

## 2022-04-23 MED ORDER — SODIUM CHLORIDE 0.9 % IV SOLN
200.0000 mg | Freq: Once | INTRAVENOUS | Status: AC
  Administered 2022-04-23: 200 mg via INTRAVENOUS
  Filled 2022-04-23: qty 200

## 2022-04-23 NOTE — Progress Notes (Signed)
[place] OFFICE PROGRESS NOTE  Patient Care Team: Earlie Server, MD as PCP - General (Oncology) Telford Nab, RN as Oncology Nurse Navigator  TREATMENT:  Right hip palliative RT 30 cGy completed 12/18/2021 Carboplatin, alimta and Keytruda completed 02/19/22. Started maintenance alimta, keytruda  ASSESSMENT & PLAN:  # Primary lung adenocarcinoma ([place]), Stage IV, PDL1 1% -Diagnosed 11/19/2021 after transbronchial biopsy of LUL nodule by Dr. Patsey Berthold.  Metastatic to pelvis, bilateral hips R>L and numerous pulmonary nodules. MRI brain showed no intracranial mets. 1 cm indeterminate left frontal calvarium lesion. Monitor.   - Sought second opinion with Dr. Varney Biles at [facility] who agreed with our treatment plan.    -Foundation 1 testing showed PD-L1 1%.  No other targetable mutations.  Report below  -Restaging CT chest abdomen pelvis showed good response to treatment.    - Started on maintenance Alimta and Keytruda on 03/12/2022. Labs reviewed today.  Today Creatinine 1.8.  Her creatinine has been fluctuating between 1.2-1.4.  However since last cycle it has worsened to 1.9.  Patient was given hydration however not much improvement seen.  There is a concern of worsening renal function due to Alimta.  Discontinue Alimta.  No Neulasta or Aranesp.  Referral to nephrology.  Beryle Flock held last time due to worsening LFTs.  LFTs improved today.  Will proceed with Keytruda 200 mg IV today.  Follow-up in 1 week for repeat labs and hydration.   # Chemotherapy induced thrombocytopenia -as above  # Normocytic anemia  -Likely secondary to chemotherapy and iron panel consistent with anemia of chronic disease. -Last week received 1 unit PRBC.  Hemoglobin 10 today.  Hold Aranesp and Alimta.  # Transaminitis -LFTs improved.  Proceed with Keytruda as above.  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 Donella Stade on 12/18/2021 -Was seen by [place]  orthopedics.  No surgical intervention needed.  #Mild difficulty swallowing -Intermittent in nature.  CT chest did not show any concerns.  # Access - port  RTC in 1 week for hydration, labs RTC in 3 weeks for MD visit, labs, Alimta and Keytruda.   Orders Placed This Encounter  Procedures   Ambulatory referral to Nephrology    Referral Priority:   Routine    Referral Type:   Consultation    Referral Reason:   Specialty Services Required    Requested Specialty:   Nephrology    Number of Visits Requested:   1   [facility]- 12/06/2021   All questions were answered. The patient knows to call the clinic with any problems, questions or concerns. The total time spent in the appointment was 35 minutes encounter with patients including review of chart and various tests results, discussions about plan of care and coordination of care plan   Jane Canary, MD 04/23/2022 2:19 PM  INTERVAL HISTORY: Please see below for problem oriented charting.  Patient following with [place] for treatment of stage IV lung adenocarcinoma.  Currently on Alimta and Keytruda maintenance.  Patient was seen today accompanied by daughter.  She has been feeling well overall.  Denies any nausea, vomiting.  No fevers or chills.  Her water intake fluctuates on good days, she takes about 16 ounces but could be low otherwise.  Denies any dizziness.  Has some right hip stiffness and plans to start physical therapy soon.  REVIEW OF SYSTEMS:   Positive ROS as above.  Rest 10 points ROS negative   I have reviewed the  past medical history, past surgical history, social history and family history with the patient and they are unchanged from previous note.  ALLERGIES:  is allergic to diclofenac, lisinopril, and sulfa antibiotics.  MEDICATIONS:  Current Outpatient Medications  Medication Sig Dispense Refill   albuterol (VENTOLIN HFA) 108 (90 Base) MCG/ACT inhaler Inhale 2 puffs into the lungs every 6 (six) hours as needed  for wheezing or shortness of breath. 18 g 0   Azelastine HCl 137 MCG/SPRAY SOLN Place 2 sprays into both nostrils 2 (two) times daily.     dexamethasone (DECADRON) 4 MG tablet Starting next cycle, take 4 mg two times a day, a day prior to chemotherapy. Do not take on the day of chemotherapy. Then take for one day after chemotherapy. 20 tablet 0   folic acid (FOLVITE) 1 MG tablet Take 1 tablet (1 mg total) by mouth daily. Start 7 days before pemetrexed chemotherapy. Continue until 21 days after pemetrexed completed. 100 tablet 3   lidocaine-prilocaine (EMLA) cream Apply to port site 30 mins prior using once 30 g 3   losartan (COZAAR) 100 MG tablet Take 1 tablet (100 mg total) by mouth daily. 90 tablet 3   nebivolol (BYSTOLIC) 2.5 MG tablet Take 1 tablet (2.5 mg total) by mouth daily. 90 tablet 3   benzonatate (TESSALON) 200 MG capsule Take 1 capsule (200 mg total) by mouth 3 (three) times daily as needed for cough. (Patient not taking: Reported on 04/17/2022) 30 capsule 0   Chromium Picolinate (CHROMIUM PICOLATE PO) Take by mouth daily. (Patient not taking: Reported on 01/21/2022)     fluticasone (FLONASE) 50 MCG/ACT nasal spray USE TWO SPRAYS IN EACH NOSTRIL ONCE DAILY prn (Patient not taking: Reported on 04/17/2022) 16 g 12   loratadine (CLARITIN) 10 MG tablet TAKE ONE TABLET BY MOUTH DAILY AS NEEDED FOR ALLERGIES (Patient not taking: Reported on 04/17/2022) 90 tablet 3   Multiple Vitamin (ONE-DAILY MULTI-VITAMIN PO) Take by mouth daily. (Patient not taking: Reported on 04/17/2022)     Omega-3 Fatty Acids (FISH OIL) 1000 MG CAPS Take by mouth. (Patient not taking: Reported on 01/21/2022)     omeprazole (PRILOSEC) 20 MG capsule Take 1 capsule (20 mg total) by mouth daily. (Patient not taking: Reported on 01/21/2022) 10 capsule 0   ondansetron (ZOFRAN) 8 MG tablet Take 1 tablet (8 mg total) by mouth every 8 (eight) hours as needed for nausea or vomiting. Start on the third day after carboplatin. (Patient  not taking: Reported on 04/17/2022) 30 tablet 1   prochlorperazine (COMPAZINE) 10 MG tablet Take 1 tablet (10 mg total) by mouth every 6 (six) hours as needed for nausea or vomiting. (Patient not taking: Reported on 12/18/2021) 30 tablet 1   Turmeric (QC TUMERIC COMPLEX PO) Take 2 tablets by mouth daily. (Patient not taking: Reported on 11/28/2021)     No current facility-administered medications for this visit.   Facility-Administered Medications Ordered in Other Visits  Medication Dose Route Frequency Provider Last Rate Last Admin   heparin lock flush 100 UNIT/ML injection            heparin lock flush 100 unit/mL  500 Units Intravenous Once Jane Canary, MD       sodium chloride flush (NS) 0.9 % injection 10 mL  10 mL Intravenous Once Jane Canary, MD        SUMMARY OF ONCOLOGIC HISTORY: Oncology History  Primary lung adenocarcinoma ([place])  10/10/2021 Initial Diagnosis   Primary lung adenocarcinoma ([place]) Stage IV  Patient seen by PCP for wheezing for 2 weeks. Imaging with CXR followed by CT chest showed Spiculated 2.1 x 2.2 cm left upper lobe pulmonary nodule with associated pleural tethering. Innumerable subcentimeter pulmonary nodules throughout the lungs.      11/14/2021 PET scan   IMPRESSION: 1. Left suprahilar nodule with maximum SUV 4.5, and a more distal 2.2 cm left upper lobe nodule with maximum SUV of 5.5. 2. Innumerable scattered small pulmonary nodules, most of which are under 5 mm in diameter, throughout both lungs. Some are cavitary. Possibilities include cavitating hematogenous disseminated malignancy versus infectious etiology subset as septic emboli. Most of the nodules are stable, some are minimally enlarged compared to 10/10/2021. 3. Abnormal mild permeative type bony findings in the right hemipelvis associated with substantially accentuated metabolic activity. Possible associated pathologic fracture through the right quadrilateral plate and right acetabulum.  The findings involve the right ilium and ischium but also the right lateral sacral ala and a small focus in the left sacrum.    Pathology Results   She had transbronchial biopsies by Dr. Patsey Berthold on 11/19/21.  Pathology from LUL nodule showed adenocarcinoma, consistent with lung primary.    11/19/2021 Cancer Staging   Staging form: Lung, AJCC 8th Edition - Clinical stage from 11/19/2021: Stage IVB (cT1c, cM1c) - Signed by Jane Canary, MD on 11/27/2021 Stage prefix: Initial diagnosis   11/27/2021 Imaging   MRI pelvis  IMPRESSION: 1. Abnormal marrow signal throughout the right ilium involving the right acetabulum, right superior pubic ramus and right inferior pubic ramus, right side of the sacrum and a smaller focus of abnormal marrow lesion in the left side of the sacrum. Findings are concerning for metastatic disease. 2. Nondisplaced pathologic fracture of the quadrilateral plate of the right acetabulum. 3. Mild muscle edema in the right gluteus minimus and medius muscles likely reflecting mild muscle strain.  MRI brain  IMPRESSION: No evidence of intracranial metastatic disease.   Indeterminate 1 cm left frontal calvarium lesion.   12/18/2021 -  Chemotherapy   Patient is on Treatment Plan : LUNG Carboplatin (5) + Pemetrexed (500) + Pembrolizumab (200) D1 q21d Induction x 4 cycles / Maintenance Pemetrexed (500) + Pembrolizumab (200) D1 q21d       PHYSICAL EXAMINATION: ECOG PERFORMANCE STATUS: 2 - Symptomatic, <50% confined to bed  Vitals:   04/23/22 1124  BP: (!) 147/78  Pulse: 95  Resp: 20  Temp: (!) 96.4 F (35.8 C)  SpO2: 100%    Filed Weights   04/23/22 1124  Weight: 142 lb 9.6 oz (64.7 kg)     Physical Exam Constitutional:      Appearance: Normal appearance.  HENT:     Head: Normocephalic and atraumatic.  Cardiovascular:     Rate and Rhythm: Normal rate.  Pulmonary:     Effort: Pulmonary effort is normal.  Musculoskeletal:     Right lower leg: No  edema.     Left lower leg: No edema.  Skin:    General: Skin is warm.  Neurological:     General: No focal deficit present.     Mental Status: She is oriented to person, place, and time.  Psychiatric:        Mood and Affect: Mood normal.     LABORATORY DATA:  I have reviewed the data as listed    Component Value Date/Time   NA 136 04/23/2022 1026   K 3.9 04/23/2022 1026   CL 109 04/23/2022 1026   CO2 17 (L) 04/23/2022 1026  GLUCOSE 157 (H) 04/23/2022 1026   BUN 38 (H) 04/23/2022 1026   CREATININE 1.81 (H) 04/23/2022 1026   CALCIUM 8.7 (L) 04/23/2022 1026   PROT 7.4 04/23/2022 1026   ALBUMIN 3.4 (L) 04/23/2022 1026   AST 59 (H) 04/23/2022 1026   ALT 64 (H) 04/23/2022 1026   ALKPHOS 187 (H) 04/23/2022 1026   BILITOT 0.5 04/23/2022 1026   GFRNONAA 30 (L) 04/23/2022 1026    No results found for: "SPEP", "UPEP"  Lab Results  Component Value Date   WBC 12.0 (H) 04/23/2022   NEUTROABS 10.5 (H) 04/23/2022   HGB 10.0 (L) 04/23/2022   HCT 29.8 (L) 04/23/2022   MCV 96.4 04/23/2022   PLT 198 04/23/2022      Chemistry      Component Value Date/Time   NA 136 04/23/2022 1026   K 3.9 04/23/2022 1026   CL 109 04/23/2022 1026   CO2 17 (L) 04/23/2022 1026   BUN 38 (H) 04/23/2022 1026   CREATININE 1.81 (H) 04/23/2022 1026      Component Value Date/Time   CALCIUM 8.7 (L) 04/23/2022 1026   ALKPHOS 187 (H) 04/23/2022 1026   AST 59 (H) 04/23/2022 1026   ALT 64 (H) 04/23/2022 1026   BILITOT 0.5 04/23/2022 1026       RADIOGRAPHIC STUDIES: I have personally reviewed the radiological images as listed and agreed with the findings in the report. No results found.

## 2022-04-23 NOTE — Patient Instructions (Signed)
[facility] CANCER CTR AT [place]  Discharge Instructions: Thank you for choosing [facility] to provide your oncology and hematology care.  If you have a lab appointment with the [facility], please go directly to the [facility] and check in at the registration area.  Wear comfortable clothing and clothing appropriate for easy access to any Portacath or PICC line.   We strive to give you quality time with your provider. You may need to reschedule your appointment if you arrive late (15 or more minutes).  Arriving late affects you and other patients whose appointments are after yours.  Also, if you miss three or more appointments without notifying the office, you may be dismissed from the clinic at the provider's discretion.      For prescription refill requests, have your pharmacy contact our office and allow 72 hours for refills to be completed.    Today you received the following chemotherapy and/or immunotherapy agents Keytruda       To help prevent nausea and vomiting after your treatment, we encourage you to take your nausea medication as directed.  BELOW ARE SYMPTOMS THAT SHOULD BE REPORTED IMMEDIATELY: *FEVER GREATER THAN 100.4 F (38 C) OR HIGHER *CHILLS OR SWEATING *NAUSEA AND VOMITING THAT IS NOT CONTROLLED WITH YOUR NAUSEA MEDICATION *UNUSUAL SHORTNESS OF BREATH *UNUSUAL BRUISING OR BLEEDING *URINARY PROBLEMS (pain or burning when urinating, or frequent urination) *BOWEL PROBLEMS (unusual diarrhea, constipation, pain near the anus) TENDERNESS IN MOUTH AND THROAT WITH OR WITHOUT PRESENCE OF ULCERS (sore throat, sores in mouth, or a toothache) UNUSUAL RASH, SWELLING OR PAIN  UNUSUAL VAGINAL DISCHARGE OR ITCHING   Items with * indicate a potential emergency and should be followed up as soon as possible or go to the Emergency Department if any problems should occur.  Please show the CHEMOTHERAPY ALERT CARD or IMMUNOTHERAPY ALERT CARD at check-in to  the Emergency Department and triage nurse.  Should you have questions after your visit or need to cancel or reschedule your appointment, please contact [facility] CANCER [place] AT [place]  [phone] and follow the prompts.  Office hours are 8:00 a.m. to 4:30 p.m. Monday - Friday. Please note that voicemails left after 4:00 p.m. may not be returned until the following business day.  We are closed weekends and major holidays. You have access to a nurse at all times for urgent questions. Please call the main number to the clinic [phone] and follow the prompts.  For any non-urgent questions, you may also contact your provider using MyChart. We now offer e-Visits for anyone 77 and older to request care online for non-urgent symptoms. For details visit [email or web address].   Also download the MyChart app! Go to the app store, search "MyChart", open the app, select [facility], and log in with your MyChart username and password.

## 2022-04-24 ENCOUNTER — Other Ambulatory Visit: Payer: PPO

## 2022-04-24 ENCOUNTER — Inpatient Hospital Stay: Payer: PPO | Admitting: Hospice and Palliative Medicine

## 2022-04-24 ENCOUNTER — Inpatient Hospital Stay: Payer: PPO

## 2022-04-24 ENCOUNTER — Encounter: Payer: PPO | Admitting: Hospice and Palliative Medicine

## 2022-04-24 ENCOUNTER — Ambulatory Visit: Payer: PPO

## 2022-04-24 LAB — THYROID PANEL WITH TSH
Free Thyroxine Index: 1.9 (ref 1.2–4.9)
T3 Uptake Ratio: 24 % (ref 24–39)
T4, Total: 8.1 ug/dL (ref 4.5–12.0)
TSH: 0.863 u[IU]/mL (ref 0.450–4.500)

## 2022-04-30 ENCOUNTER — Inpatient Hospital Stay: Payer: PPO

## 2022-04-30 VITALS — BP 121/81 | HR 78 | Temp 98.6°F | Resp 20

## 2022-04-30 DIAGNOSIS — C3490 Malignant neoplasm of unspecified part of unspecified bronchus or lung: Secondary | ICD-10-CM

## 2022-04-30 DIAGNOSIS — Z5112 Encounter for antineoplastic immunotherapy: Secondary | ICD-10-CM | POA: Diagnosis not present

## 2022-04-30 LAB — CBC WITH DIFFERENTIAL/PLATELET
Abs Immature Granulocytes: 0.02 10*3/uL (ref 0.00–0.07)
Basophils Absolute: 0 10*3/uL (ref 0.0–0.1)
Basophils Relative: 1 %
Eosinophils Absolute: 0.2 10*3/uL (ref 0.0–0.5)
Eosinophils Relative: 3 %
HCT: 30 % — ABNORMAL LOW (ref 36.0–46.0)
Hemoglobin: 9.9 g/dL — ABNORMAL LOW (ref 12.0–15.0)
Immature Granulocytes: 0 %
Lymphocytes Relative: 22 %
Lymphs Abs: 1.2 10*3/uL (ref 0.7–4.0)
MCH: 32.7 pg (ref 26.0–34.0)
MCHC: 33 g/dL (ref 30.0–36.0)
MCV: 99 fL (ref 80.0–100.0)
Monocytes Absolute: 0.7 10*3/uL (ref 0.1–1.0)
Monocytes Relative: 12 %
Neutro Abs: 3.5 10*3/uL (ref 1.7–7.7)
Neutrophils Relative %: 62 %
Platelets: 203 10*3/uL (ref 150–400)
RBC: 3.03 MIL/uL — ABNORMAL LOW (ref 3.87–5.11)
RDW: 23.3 % — ABNORMAL HIGH (ref 11.5–15.5)
WBC: 5.5 10*3/uL (ref 4.0–10.5)
nRBC: 0 % (ref 0.0–0.2)

## 2022-04-30 LAB — COMPREHENSIVE METABOLIC PANEL
ALT: 34 U/L (ref 0–44)
AST: 38 U/L (ref 15–41)
Albumin: 3.4 g/dL — ABNORMAL LOW (ref 3.5–5.0)
Alkaline Phosphatase: 168 U/L — ABNORMAL HIGH (ref 38–126)
Anion gap: 7 (ref 5–15)
BUN: 30 mg/dL — ABNORMAL HIGH (ref 8–23)
CO2: 20 mmol/L — ABNORMAL LOW (ref 22–32)
Calcium: 8.8 mg/dL — ABNORMAL LOW (ref 8.9–10.3)
Chloride: 111 mmol/L (ref 98–111)
Creatinine, Ser: 1.8 mg/dL — ABNORMAL HIGH (ref 0.44–1.00)
GFR, Estimated: 30 mL/min — ABNORMAL LOW (ref >60)
Glucose, Bld: 101 mg/dL — ABNORMAL HIGH (ref 70–99)
Potassium: 4.1 mmol/L (ref 3.5–5.1)
Sodium: 138 mmol/L (ref 135–145)
Total Bilirubin: 0.7 mg/dL (ref 0.3–1.2)
Total Protein: 6.7 g/dL (ref 6.5–8.1)

## 2022-04-30 MED ORDER — SODIUM CHLORIDE 0.9% FLUSH
10.0000 mL | Freq: Once | INTRAVENOUS | Status: AC | PRN
  Administered 2022-04-30: 10 mL
  Filled 2022-04-30: qty 10

## 2022-04-30 MED ORDER — SODIUM CHLORIDE 0.9 % IV SOLN
Freq: Once | INTRAVENOUS | Status: AC
  Filled 2022-04-30: qty 250

## 2022-04-30 MED ORDER — HEPARIN SOD (PORK) LOCK FLUSH 100 UNIT/ML IV SOLN
500.0000 [IU] | Freq: Once | INTRAVENOUS | Status: AC | PRN
  Administered 2022-04-30: 500 [IU]
  Filled 2022-04-30: qty 5

## 2022-05-01 DIAGNOSIS — R2689 Other abnormalities of gait and mobility: Secondary | ICD-10-CM | POA: Diagnosis not present

## 2022-05-07 ENCOUNTER — Encounter: Payer: Self-pay | Admitting: Internal Medicine

## 2022-05-08 DIAGNOSIS — R2689 Other abnormalities of gait and mobility: Secondary | ICD-10-CM | POA: Diagnosis not present

## 2022-05-10 DIAGNOSIS — R2689 Other abnormalities of gait and mobility: Secondary | ICD-10-CM | POA: Diagnosis not present

## 2022-05-14 ENCOUNTER — Inpatient Hospital Stay: Payer: PPO

## 2022-05-14 ENCOUNTER — Encounter: Payer: Self-pay | Admitting: Internal Medicine

## 2022-05-14 ENCOUNTER — Inpatient Hospital Stay (HOSPITAL_BASED_OUTPATIENT_CLINIC_OR_DEPARTMENT_OTHER): Payer: PPO | Admitting: Internal Medicine

## 2022-05-14 VITALS — BP 141/89 | HR 102 | Temp 98.6°F | Resp 20 | Wt 140.1 lb

## 2022-05-14 DIAGNOSIS — C3490 Malignant neoplasm of unspecified part of unspecified bronchus or lung: Secondary | ICD-10-CM

## 2022-05-14 DIAGNOSIS — R7401 Elevation of levels of liver transaminase levels: Secondary | ICD-10-CM | POA: Diagnosis not present

## 2022-05-14 DIAGNOSIS — Z5112 Encounter for antineoplastic immunotherapy: Secondary | ICD-10-CM | POA: Diagnosis not present

## 2022-05-14 DIAGNOSIS — D6481 Anemia due to antineoplastic chemotherapy: Secondary | ICD-10-CM | POA: Diagnosis not present

## 2022-05-14 DIAGNOSIS — T451X5A Adverse effect of antineoplastic and immunosuppressive drugs, initial encounter: Secondary | ICD-10-CM | POA: Diagnosis not present

## 2022-05-14 LAB — CBC WITH DIFFERENTIAL/PLATELET
Abs Immature Granulocytes: 0.01 10*3/uL (ref 0.00–0.07)
Basophils Absolute: 0 10*3/uL (ref 0.0–0.1)
Basophils Relative: 1 %
Eosinophils Absolute: 0.2 10*3/uL (ref 0.0–0.5)
Eosinophils Relative: 3 %
HCT: 29.3 % — ABNORMAL LOW (ref 36.0–46.0)
Hemoglobin: 10.1 g/dL — ABNORMAL LOW (ref 12.0–15.0)
Immature Granulocytes: 0 %
Lymphocytes Relative: 23 %
Lymphs Abs: 1.1 10*3/uL (ref 0.7–4.0)
MCH: 33.8 pg (ref 26.0–34.0)
MCHC: 34.5 g/dL (ref 30.0–36.0)
MCV: 98 fL (ref 80.0–100.0)
Monocytes Absolute: 0.5 10*3/uL (ref 0.1–1.0)
Monocytes Relative: 10 %
Neutro Abs: 3 10*3/uL (ref 1.7–7.7)
Neutrophils Relative %: 63 %
Platelets: 213 10*3/uL (ref 150–400)
RBC: 2.99 MIL/uL — ABNORMAL LOW (ref 3.87–5.11)
RDW: 20.6 % — ABNORMAL HIGH (ref 11.5–15.5)
WBC: 4.8 10*3/uL (ref 4.0–10.5)
nRBC: 0 % (ref 0.0–0.2)

## 2022-05-14 LAB — COMPREHENSIVE METABOLIC PANEL
ALT: 20 U/L (ref 0–44)
AST: 31 U/L (ref 15–41)
Albumin: 3.4 g/dL — ABNORMAL LOW (ref 3.5–5.0)
Alkaline Phosphatase: 169 U/L — ABNORMAL HIGH (ref 38–126)
Anion gap: 10 (ref 5–15)
BUN: 32 mg/dL — ABNORMAL HIGH (ref 8–23)
CO2: 19 mmol/L — ABNORMAL LOW (ref 22–32)
Calcium: 8.9 mg/dL (ref 8.9–10.3)
Chloride: 107 mmol/L (ref 98–111)
Creatinine, Ser: 1.69 mg/dL — ABNORMAL HIGH (ref 0.44–1.00)
GFR, Estimated: 32 mL/min — ABNORMAL LOW (ref >60)
Glucose, Bld: 104 mg/dL — ABNORMAL HIGH (ref 70–99)
Potassium: 4.1 mmol/L (ref 3.5–5.1)
Sodium: 136 mmol/L (ref 135–145)
Total Bilirubin: 0.4 mg/dL (ref 0.3–1.2)
Total Protein: 7.1 g/dL (ref 6.5–8.1)

## 2022-05-14 MED ORDER — HEPARIN SOD (PORK) LOCK FLUSH 100 UNIT/ML IV SOLN
500.0000 [IU] | Freq: Once | INTRAVENOUS | Status: AC | PRN
  Administered 2022-05-14: 500 [IU]
  Filled 2022-05-14: qty 5

## 2022-05-14 MED ORDER — SODIUM CHLORIDE 0.9 % IV SOLN
200.0000 mg | Freq: Once | INTRAVENOUS | Status: AC
  Administered 2022-05-14: 200 mg via INTRAVENOUS
  Filled 2022-05-14: qty 200

## 2022-05-14 NOTE — Patient Instructions (Signed)
[facility] CANCER CENTER AT [facility] REGIONAL  Discharge Instructions: Thank you for choosing [facility] to provide your oncology and hematology care.  If you have a lab appointment with the Cancer Center, please go directly to the Cancer Center and check in at the registration area.  Wear comfortable clothing and clothing appropriate for easy access to any Portacath or PICC line.   We strive to give you quality time with your provider. You may need to reschedule your appointment if you arrive late (15 or more minutes).  Arriving late affects you and other patients whose appointments are after yours.  Also, if you miss three or more appointments without notifying the office, you may be dismissed from the clinic at the provider's discretion.      For prescription refill requests, have your pharmacy contact our office and allow 72 hours for refills to be completed.    Today you received the following chemotherapy and/or immunotherapy agents- Keytruda      To help prevent nausea and vomiting after your treatment, we encourage you to take your nausea medication as directed.  BELOW ARE SYMPTOMS THAT SHOULD BE REPORTED IMMEDIATELY: *FEVER GREATER THAN 100.4 F (38 C) OR HIGHER *CHILLS OR SWEATING *NAUSEA AND VOMITING THAT IS NOT CONTROLLED WITH YOUR NAUSEA MEDICATION *UNUSUAL SHORTNESS OF BREATH *UNUSUAL BRUISING OR BLEEDING *URINARY PROBLEMS (pain or burning when urinating, or frequent urination) *BOWEL PROBLEMS (unusual diarrhea, constipation, pain near the anus) TENDERNESS IN MOUTH AND THROAT WITH OR WITHOUT PRESENCE OF ULCERS (sore throat, sores in mouth, or a toothache) UNUSUAL RASH, SWELLING OR PAIN  UNUSUAL VAGINAL DISCHARGE OR ITCHING   Items with * indicate a potential emergency and should be followed up as soon as possible or go to the Emergency Department if any problems should occur.  Please show the CHEMOTHERAPY ALERT CARD or IMMUNOTHERAPY ALERT CARD at check-in to  the Emergency Department and triage nurse.  Should you have questions after your visit or need to cancel or reschedule your appointment, please contact [place] CANCER CENTER AT [facility] REGIONAL  [phone] and follow the prompts.  Office hours are 8:00 a.m. to 4:30 p.m. Monday - Friday. Please note that voicemails left after 4:00 p.m. may not be returned until the following business day.  We are closed weekends and major holidays. You have access to a nurse at all times for urgent questions. Please call the main number to the clinic [phone] and follow the prompts.  For any non-urgent questions, you may also contact your provider using MyChart. We now offer e-Visits for anyone 25 and older to request care online for non-urgent symptoms. For details visit [email or web address].   Also download the MyChart app! Go to the app store, search "MyChart", open the app, select [facility], and log in with your MyChart username and password.

## 2022-05-14 NOTE — Progress Notes (Signed)
Patient daughter would like to know when her next Scans will take place.

## 2022-05-14 NOTE — Patient Instructions (Signed)
Stop taking Dexamethasone and Folic acid.

## 2022-05-14 NOTE — Progress Notes (Signed)
Nutrition Follow-up:  Patient with stage IV lung cancer.  Patient receiving [place].  Met with patient and daughter during infusion.  Patient reports that her appetite is good.  Denies nausea or diarrhea.  Reports that sweet foods really taste overly sweet.  She is leaning more towards salty foods.  Premier protein shakes seem really sweet.  Usually eats cereal for breakfast or eggs with bacon and toast or grits or salmon patties around 10-11am.  Then eats dinner around 5-6pm.      Medications: reviewed  Labs: reviewed  Anthropometrics:   Weight 140 lb 1.6 oz today  141 lb 12/12 141 lb on 11/21 143 lb on 10/31 149 lb on 10/10   NUTRITION DIAGNOSIS: Increased nutrient needs continues   INTERVENTION:  Add snack/meal between brunch and dinner.  Discussed options for patient.  Add cheese to toast with breakfast for additional calories and protein Dilute premier protein shake with milk to decrease sweetness     MONITORING, EVALUATION, GOAL: weight trends, intake   NEXT VISIT: Tuesday, Feb 13 during infusion  Kamilya Wakeman B. Freida Busman, RD, LDN Registered Dietitian [phone]

## 2022-05-14 NOTE — Progress Notes (Signed)
[facility] OFFICE PROGRESS NOTE  Patient Care Team: Rickard Patience, MD as PCP - General (Oncology) Glory Buff, RN as Oncology Nurse Navigator  TREATMENT:  Right hip palliative RT 30 cGy completed 12/18/2021 Carboplatin, alimta and Keytruda completed 02/19/22. Started maintenance alimta, keytruda  ASSESSMENT & PLAN:  # Primary lung adenocarcinoma (HCC), Stage IV, PDL1 1% -Diagnosed 11/19/2021 after transbronchial biopsy of LUL nodule by Dr. Jayme Cloud.  Metastatic to pelvis, bilateral hips R>L and numerous pulmonary nodules. MRI brain showed no intracranial mets. 1 cm indeterminate left frontal calvarium lesion. Monitor.   -Sought second opinion with Dr. Veronia Beets at [facility] who agreed with our treatment plan.    -Foundation 1 testing showed PD-L1 1%.  No other targetable mutations.  Report below  -Restaging from Nov 2023 -CT chest abdomen pelvis showed good response to treatment.    -discontinued maintenance Alimta on 04/23/2022 due to worsening renal function, hematologic toxicity requiring multiple blood transfusions and elevated LFTs despite dose reductions. Now on single agent keytruda every 3 weeks. Tolerating well. Labs reviewed and acceptable. Proceed with cycle 8 today. Repeat CT chest/abdo/pelvis end of Feb 2024. Ordered.   #AKI - likely secondary to Alimta. Cr stabilized.  - Patient is scheduled to see Nephrology 2/28.   # Normocytic anemia  -Likely secondary to chemotherapy and iron panel consistent with anemia of chronic disease. -improving since off chemotherapy.   # Transaminitis -LFTs improved after discontinuation of Alimta.   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 Aggie Cosier on 12/18/2021 -Was seen by [facility].  No surgical intervention needed.  # Access - port  RTC in 3 weeks for covering MD visit, labs, Keytruda. RTC in 6 weeks for Dr. Mervyn Skeeters, labs, Rande Lawman   Orders Placed This Encounter  Procedures    CT CHEST ABDOMEN PELVIS WO CONTRAST    Standing Status:   Future    Standing Expiration Date:   05/15/2023    Order Specific Question:   Preferred imaging location?    Answer:   [facility]    Order Specific Question:   Is Oral Contrast requested for this exam?    Answer:   Yes, Per Radiology protocol    Order Specific Question:   Does the patient have a contrast media/X-ray dye allergy?    Answer:   No   [facility]- 12/06/2021   All questions were answered. The patient knows to call the clinic with any problems, questions or concerns. The total time spent in the appointment was 30 minutes encounter with patients including review of chart and various tests results, discussions about plan of care and coordination of care plan   Michaelyn Barter, MD 05/14/2022 10:13 AM  INTERVAL HISTORY: Please see below for problem oriented charting.  Patient following with [place] for treatment of stage IV lung adenocarcinoma.  Currently on Keytruda maintenance.  Patient was seen today accompanied by daughter.  She has been feeling well overall.  Denies any nausea, vomiting, fever, chills. Started PT. Improvement in right hip stiffness. Feels stronger.    REVIEW OF SYSTEMS:   Positive ROS as above.  Rest 10 points ROS negative   I have reviewed the past medical history, past surgical history, social history and family history with the patient and they are unchanged from previous note.  ALLERGIES:  is allergic to diclofenac, lisinopril, and sulfa antibiotics.  MEDICATIONS:  Current Outpatient Medications  Medication Sig Dispense Refill   albuterol (VENTOLIN HFA)  108 (90 Base) MCG/ACT inhaler Inhale 2 puffs into the lungs every 6 (six) hours as needed for wheezing or shortness of breath. 18 g 0   Azelastine HCl 137 MCG/SPRAY SOLN Place 2 sprays into both nostrils 2 (two) times daily.     dexamethasone (DECADRON) 4 MG tablet Starting next cycle, take 4 mg two times a day, a day prior to  chemotherapy. Do not take on the day of chemotherapy. Then take for one day after chemotherapy. 20 tablet 0   folic acid (FOLVITE) 1 MG tablet Take 1 tablet (1 mg total) by mouth daily. Start 7 days before pemetrexed chemotherapy. Continue until 21 days after pemetrexed completed. 100 tablet 3   lidocaine-prilocaine (EMLA) cream Apply to port site 30 mins prior using once 30 g 3   losartan (COZAAR) 100 MG tablet Take 1 tablet (100 mg total) by mouth daily. 90 tablet 3   nebivolol (BYSTOLIC) 2.5 MG tablet Take 1 tablet (2.5 mg total) by mouth daily. 90 tablet 3   benzonatate (TESSALON) 200 MG capsule Take 1 capsule (200 mg total) by mouth 3 (three) times daily as needed for cough. (Patient not taking: Reported on 04/17/2022) 30 capsule 0   Chromium Picolinate (CHROMIUM PICOLATE PO) Take by mouth daily. (Patient not taking: Reported on 01/21/2022)     fluticasone (FLONASE) 50 MCG/ACT nasal spray USE TWO SPRAYS IN EACH NOSTRIL ONCE DAILY prn (Patient not taking: Reported on 04/17/2022) 16 g 12   loratadine (CLARITIN) 10 MG tablet TAKE ONE TABLET BY MOUTH DAILY AS NEEDED FOR ALLERGIES (Patient not taking: Reported on 04/17/2022) 90 tablet 3   Multiple Vitamin (ONE-DAILY MULTI-VITAMIN PO) Take by mouth daily. (Patient not taking: Reported on 04/17/2022)     Omega-3 Fatty Acids (FISH OIL) 1000 MG CAPS Take by mouth. (Patient not taking: Reported on 01/21/2022)     omeprazole (PRILOSEC) 20 MG capsule Take 1 capsule (20 mg total) by mouth daily. (Patient not taking: Reported on 01/21/2022) 10 capsule 0   ondansetron (ZOFRAN) 8 MG tablet Take 1 tablet (8 mg total) by mouth every 8 (eight) hours as needed for nausea or vomiting. Start on the third day after carboplatin. (Patient not taking: Reported on 04/17/2022) 30 tablet 1   prochlorperazine (COMPAZINE) 10 MG tablet Take 1 tablet (10 mg total) by mouth every 6 (six) hours as needed for nausea or vomiting. (Patient not taking: Reported on 12/18/2021) 30 tablet 1    Turmeric (QC TUMERIC COMPLEX PO) Take 2 tablets by mouth daily. (Patient not taking: Reported on 11/28/2021)     No current facility-administered medications for this visit.   Facility-Administered Medications Ordered in Other Visits  Medication Dose Route Frequency Provider Last Rate Last Admin   heparin lock flush 100 UNIT/ML injection            heparin lock flush 100 unit/mL  500 Units Intravenous Once Michaelyn Barter, MD       sodium chloride flush (NS) 0.9 % injection 10 mL  10 mL Intravenous Once Michaelyn Barter, MD        SUMMARY OF ONCOLOGIC HISTORY: Oncology History  Primary lung adenocarcinoma (HCC)  10/10/2021 Initial Diagnosis   Primary lung adenocarcinoma (HCC) Stage IV  Patient seen by PCP for wheezing for 2 weeks. Imaging with CXR followed by CT chest showed Spiculated 2.1 x 2.2 cm left upper lobe pulmonary nodule with associated pleural tethering. Innumerable subcentimeter pulmonary nodules throughout the lungs.      11/14/2021 PET scan  IMPRESSION: 1. Left suprahilar nodule with maximum SUV 4.5, and a more distal 2.2 cm left upper lobe nodule with maximum SUV of 5.5. 2. Innumerable scattered small pulmonary nodules, most of which are under 5 mm in diameter, throughout both lungs. Some are cavitary. Possibilities include cavitating hematogenous disseminated malignancy versus infectious etiology subset as septic emboli. Most of the nodules are stable, some are minimally enlarged compared to 10/10/2021. 3. Abnormal mild permeative type bony findings in the right hemipelvis associated with substantially accentuated metabolic activity. Possible associated pathologic fracture through the right quadrilateral plate and right acetabulum. The findings involve the right ilium and ischium but also the right lateral sacral ala and a small focus in the left sacrum.    Pathology Results   She had transbronchial biopsies by Dr. Jayme Cloud on 11/19/21.  Pathology from LUL nodule  showed adenocarcinoma, consistent with lung primary.    11/19/2021 Cancer Staging   Staging form: Lung, AJCC 8th Edition - Clinical stage from 11/19/2021: Stage IVB (cT1c, cM1c) - Signed by Michaelyn Barter, MD on 11/27/2021 Stage prefix: Initial diagnosis   11/27/2021 Imaging   MRI pelvis  IMPRESSION: 1. Abnormal marrow signal throughout the right ilium involving the right acetabulum, right superior pubic ramus and right inferior pubic ramus, right side of the sacrum and a smaller focus of abnormal marrow lesion in the left side of the sacrum. Findings are concerning for metastatic disease. 2. Nondisplaced pathologic fracture of the quadrilateral plate of the right acetabulum. 3. Mild muscle edema in the right gluteus minimus and medius muscles likely reflecting mild muscle strain.  MRI brain  IMPRESSION: No evidence of intracranial metastatic disease.   Indeterminate 1 cm left frontal calvarium lesion.   12/18/2021 -  Chemotherapy   Patient is on Treatment Plan : LUNG Carboplatin (5) + Pemetrexed (500) + Pembrolizumab (200) D1 q21d Induction x 4 cycles / Maintenance Pemetrexed (500) + Pembrolizumab (200) D1 q21d       PHYSICAL EXAMINATION: ECOG PERFORMANCE STATUS: 2 - Symptomatic, <50% confined to bed  Vitals:   05/14/22 0938  BP: (!) 141/89  Pulse: (!) 102  Resp: 20  Temp: 98.6 F (37 C)  SpO2: 100%    Filed Weights   05/14/22 0938  Weight: 140 lb 1.6 oz (63.5 kg)     Physical Exam Constitutional:      Appearance: Normal appearance.  HENT:     Head: Normocephalic and atraumatic.  Cardiovascular:     Rate and Rhythm: Normal rate.  Pulmonary:     Effort: Pulmonary effort is normal.  Musculoskeletal:     Right lower leg: No edema.     Left lower leg: No edema.  Skin:    General: Skin is warm.  Neurological:     General: No focal deficit present.     Mental Status: She is oriented to person, place, and time.  Psychiatric:        Mood and Affect: Mood normal.      LABORATORY DATA:  I have reviewed the data as listed    Component Value Date/Time   NA 136 05/14/2022 0920   K 4.1 05/14/2022 0920   CL 107 05/14/2022 0920   CO2 19 (L) 05/14/2022 0920   GLUCOSE 104 (H) 05/14/2022 0920   BUN 32 (H) 05/14/2022 0920   CREATININE 1.69 (H) 05/14/2022 0920   CALCIUM 8.9 05/14/2022 0920   PROT 7.1 05/14/2022 0920   ALBUMIN 3.4 (L) 05/14/2022 0920   AST 31 05/14/2022 0920  ALT 20 05/14/2022 0920   ALKPHOS 169 (H) 05/14/2022 0920   BILITOT 0.4 05/14/2022 0920   GFRNONAA 32 (L) 05/14/2022 0920    No results found for: "SPEP", "UPEP"  Lab Results  Component Value Date   WBC 4.8 05/14/2022   NEUTROABS 3.0 05/14/2022   HGB 10.1 (L) 05/14/2022   HCT 29.3 (L) 05/14/2022   MCV 98.0 05/14/2022   PLT 213 05/14/2022      Chemistry      Component Value Date/Time   NA 136 05/14/2022 0920   K 4.1 05/14/2022 0920   CL 107 05/14/2022 0920   CO2 19 (L) 05/14/2022 0920   BUN 32 (H) 05/14/2022 0920   CREATININE 1.69 (H) 05/14/2022 0920      Component Value Date/Time   CALCIUM 8.9 05/14/2022 0920   ALKPHOS 169 (H) 05/14/2022 0920   AST 31 05/14/2022 0920   ALT 20 05/14/2022 0920   BILITOT 0.4 05/14/2022 0920       RADIOGRAPHIC STUDIES: I have personally reviewed the radiological images as listed and agreed with the findings in the report. No results found.

## 2022-05-15 DIAGNOSIS — R2689 Other abnormalities of gait and mobility: Secondary | ICD-10-CM | POA: Diagnosis not present

## 2022-05-15 LAB — THYROID PANEL WITH TSH
Free Thyroxine Index: 2.2 (ref 1.2–4.9)
T3 Uptake Ratio: 28 % (ref 24–39)
T4, Total: 7.8 ug/dL (ref 4.5–12.0)
TSH: 2.59 u[IU]/mL (ref 0.450–4.500)

## 2022-05-16 ENCOUNTER — Other Ambulatory Visit: Payer: Self-pay

## 2022-05-17 DIAGNOSIS — R2689 Other abnormalities of gait and mobility: Secondary | ICD-10-CM | POA: Diagnosis not present

## 2022-05-20 DIAGNOSIS — R2689 Other abnormalities of gait and mobility: Secondary | ICD-10-CM | POA: Diagnosis not present

## 2022-05-23 DIAGNOSIS — R2689 Other abnormalities of gait and mobility: Secondary | ICD-10-CM | POA: Diagnosis not present

## 2022-05-27 DIAGNOSIS — R2689 Other abnormalities of gait and mobility: Secondary | ICD-10-CM | POA: Diagnosis not present

## 2022-06-04 ENCOUNTER — Inpatient Hospital Stay (HOSPITAL_BASED_OUTPATIENT_CLINIC_OR_DEPARTMENT_OTHER): Payer: PPO | Admitting: Internal Medicine

## 2022-06-04 ENCOUNTER — Inpatient Hospital Stay: Payer: PPO | Attending: Internal Medicine

## 2022-06-04 ENCOUNTER — Encounter: Payer: Self-pay | Admitting: Internal Medicine

## 2022-06-04 ENCOUNTER — Inpatient Hospital Stay: Payer: PPO

## 2022-06-04 VITALS — BP 138/83 | HR 101 | Temp 97.5°F | Resp 18 | Ht 59.5 in | Wt 141.7 lb

## 2022-06-04 DIAGNOSIS — I10 Essential (primary) hypertension: Secondary | ICD-10-CM | POA: Diagnosis not present

## 2022-06-04 DIAGNOSIS — C3412 Malignant neoplasm of upper lobe, left bronchus or lung: Secondary | ICD-10-CM | POA: Insufficient documentation

## 2022-06-04 DIAGNOSIS — C3490 Malignant neoplasm of unspecified part of unspecified bronchus or lung: Secondary | ICD-10-CM | POA: Diagnosis not present

## 2022-06-04 DIAGNOSIS — Z79899 Other long term (current) drug therapy: Secondary | ICD-10-CM | POA: Diagnosis not present

## 2022-06-04 DIAGNOSIS — R7401 Elevation of levels of liver transaminase levels: Secondary | ICD-10-CM | POA: Diagnosis not present

## 2022-06-04 DIAGNOSIS — Z8616 Personal history of COVID-19: Secondary | ICD-10-CM | POA: Diagnosis not present

## 2022-06-04 DIAGNOSIS — R609 Edema, unspecified: Secondary | ICD-10-CM | POA: Insufficient documentation

## 2022-06-04 DIAGNOSIS — M533 Sacrococcygeal disorders, not elsewhere classified: Secondary | ICD-10-CM | POA: Insufficient documentation

## 2022-06-04 DIAGNOSIS — D649 Anemia, unspecified: Secondary | ICD-10-CM | POA: Diagnosis not present

## 2022-06-04 DIAGNOSIS — Z803 Family history of malignant neoplasm of breast: Secondary | ICD-10-CM | POA: Insufficient documentation

## 2022-06-04 DIAGNOSIS — Z7952 Long term (current) use of systemic steroids: Secondary | ICD-10-CM | POA: Insufficient documentation

## 2022-06-04 DIAGNOSIS — Z5112 Encounter for antineoplastic immunotherapy: Secondary | ICD-10-CM | POA: Insufficient documentation

## 2022-06-04 LAB — COMPREHENSIVE METABOLIC PANEL
ALT: 19 U/L (ref 0–44)
AST: 31 U/L (ref 15–41)
Albumin: 3.4 g/dL — ABNORMAL LOW (ref 3.5–5.0)
Alkaline Phosphatase: 174 U/L — ABNORMAL HIGH (ref 38–126)
Anion gap: 7 (ref 5–15)
BUN: 27 mg/dL — ABNORMAL HIGH (ref 8–23)
CO2: 21 mmol/L — ABNORMAL LOW (ref 22–32)
Calcium: 9 mg/dL (ref 8.9–10.3)
Chloride: 109 mmol/L (ref 98–111)
Creatinine, Ser: 1.76 mg/dL — ABNORMAL HIGH (ref 0.44–1.00)
GFR, Estimated: 31 mL/min — ABNORMAL LOW (ref >60)
Glucose, Bld: 116 mg/dL — ABNORMAL HIGH (ref 70–99)
Potassium: 4 mmol/L (ref 3.5–5.1)
Sodium: 137 mmol/L (ref 135–145)
Total Bilirubin: 0.5 mg/dL (ref 0.3–1.2)
Total Protein: 7.1 g/dL (ref 6.5–8.1)

## 2022-06-04 LAB — CBC WITH DIFFERENTIAL/PLATELET
Abs Immature Granulocytes: 0.01 10*3/uL (ref 0.00–0.07)
Basophils Absolute: 0 10*3/uL (ref 0.0–0.1)
Basophils Relative: 1 %
Eosinophils Absolute: 0.1 10*3/uL (ref 0.0–0.5)
Eosinophils Relative: 2 %
HCT: 29.3 % — ABNORMAL LOW (ref 36.0–46.0)
Hemoglobin: 9.9 g/dL — ABNORMAL LOW (ref 12.0–15.0)
Immature Granulocytes: 0 %
Lymphocytes Relative: 29 %
Lymphs Abs: 0.8 10*3/uL (ref 0.7–4.0)
MCH: 33.9 pg (ref 26.0–34.0)
MCHC: 33.8 g/dL (ref 30.0–36.0)
MCV: 100.3 fL — ABNORMAL HIGH (ref 80.0–100.0)
Monocytes Absolute: 0.3 10*3/uL (ref 0.1–1.0)
Monocytes Relative: 10 %
Neutro Abs: 1.7 10*3/uL (ref 1.7–7.7)
Neutrophils Relative %: 58 %
Platelets: 144 10*3/uL — ABNORMAL LOW (ref 150–400)
RBC: 2.92 MIL/uL — ABNORMAL LOW (ref 3.87–5.11)
RDW: 16.6 % — ABNORMAL HIGH (ref 11.5–15.5)
WBC: 2.9 10*3/uL — ABNORMAL LOW (ref 4.0–10.5)
nRBC: 0 % (ref 0.0–0.2)

## 2022-06-04 MED ORDER — CYANOCOBALAMIN 1000 MCG/ML IJ SOLN: 1000.0000 ug | Freq: Once | INTRAMUSCULAR | Status: DC

## 2022-06-04 MED ORDER — HEPARIN SOD (PORK) LOCK FLUSH 100 UNIT/ML IV SOLN
500.0000 [IU] | Freq: Once | INTRAVENOUS | Status: AC | PRN
  Administered 2022-06-04: 500 [IU]
  Filled 2022-06-04: qty 5

## 2022-06-04 MED ORDER — SODIUM CHLORIDE 0.9 % IV SOLN
Freq: Once | INTRAVENOUS | Status: AC
  Filled 2022-06-04: qty 250

## 2022-06-04 MED ORDER — SODIUM CHLORIDE 0.9 % IV SOLN
200.0000 mg | Freq: Once | INTRAVENOUS | Status: AC
  Administered 2022-06-04: 200 mg via INTRAVENOUS
  Filled 2022-06-04: qty 8

## 2022-06-04 NOTE — Progress Notes (Signed)
Nutrition Follow-up:  Patient with stage IV lung cancer.  Patient receiving [place].    Met with patient during infusion.  Patient reports that her appetite is good.  Has been snacking on nuts.  Has been eating a variety of foods (salmon, chicken, spaghetti, salad, fruit, raisin bran, bacon, egg, grits. Has been drinking some of the premier protein shakes.  Denies any nutrition impact symptoms at this time    Medications: reviewed  Labs: reviewed  Anthropometrics:   Weight 141 lb 11.2 oz 140 lb 1.6 oz on 1/23 141 lb on 12/12 141 lb on 11/21 143 lb on 10/31 149 lb on 10/10   NUTRITION DIAGNOSIS: Increased nutrient needs continues   INTERVENTION:  Continue snacking on high calorie, high protein foods Continue oral nutrition supplement     MONITORING, EVALUATION, GOAL: weight trends, intake   NEXT VISIT: as needed  Shanetha Bradham B. Zenia Resides, [place], [place] Registered Dietitian [phone]

## 2022-06-04 NOTE — Progress Notes (Signed)
[facility] NOTE  Patient Care Team: Earlie Server, MD as PCP - General (Oncology) Telford Nab, RN as Oncology Nurse Navigator  CHIEF COMPLAINTS/PURPOSE OF CONSULTATION: Lung cancer  Oncology History  Primary lung adenocarcinoma ([place])  10/10/2021 Initial Diagnosis   Primary lung adenocarcinoma ([place]) Stage IV  Patient seen by PCP for wheezing for 2 weeks. Imaging with CXR followed by CT chest showed Spiculated 2.1 x 2.2 cm left upper lobe pulmonary nodule with associated pleural tethering. Innumerable subcentimeter pulmonary nodules throughout the lungs.      11/14/2021 PET scan   IMPRESSION: 1. Left suprahilar nodule with maximum SUV 4.5, and a more distal 2.2 cm left upper lobe nodule with maximum SUV of 5.5. 2. Innumerable scattered small pulmonary nodules, most of which are under 5 mm in diameter, throughout both lungs. Some are cavitary. Possibilities include cavitating hematogenous disseminated malignancy versus infectious etiology subset as septic emboli. Most of the nodules are stable, some are minimally enlarged compared to 10/10/2021. 3. Abnormal mild permeative type bony findings in the right hemipelvis associated with substantially accentuated metabolic activity. Possible associated pathologic fracture through the right quadrilateral plate and right acetabulum. The findings involve the right ilium and ischium but also the right lateral sacral ala and a small focus in the left sacrum.    Pathology Results   She had transbronchial biopsies by Dr. Patsey Berthold on 11/19/21.  Pathology from LUL nodule showed adenocarcinoma, consistent with lung primary.    11/19/2021 Cancer Staging   Staging form: Lung, AJCC 8th Edition - Clinical stage from 11/19/2021: Stage IVB (cT1c, cM1c) - Signed by Jane Canary, MD on 11/27/2021 Stage prefix: Initial diagnosis   11/27/2021 Imaging   MRI pelvis  IMPRESSION: 1. Abnormal marrow signal throughout the right ilium  involving the right acetabulum, right superior pubic ramus and right inferior pubic ramus, right side of the sacrum and a smaller focus of abnormal marrow lesion in the left side of the sacrum. Findings are concerning for metastatic disease. 2. Nondisplaced pathologic fracture of the quadrilateral plate of the right acetabulum. 3. Mild muscle edema in the right gluteus minimus and medius muscles likely reflecting mild muscle strain.  MRI brain  IMPRESSION: No evidence of intracranial metastatic disease.   Indeterminate 1 cm left frontal calvarium lesion.   12/18/2021 -  Chemotherapy   Patient is on Treatment Plan : LUNG Carboplatin (5) + Pemetrexed (500) + Pembrolizumab (200) D1 q21d Induction x 4 cycles / Maintenance Pemetrexed (500) + Pembrolizumab (200) D1 q21d     Primary malignant neoplasm of left upper lobe of lung ([place])  06/04/2022 Initial Diagnosis   Primary malignant neoplasm of left upper lobe of lung ([place])   06/04/2022 Cancer Staging   Staging form: Lung, AJCC 8th Edition - Pathologic: Stage IVB (pT1c, pNX, cM1c) - Signed by Cammie Sickle, MD on 06/04/2022      HISTORY OF PRESENTING ILLNESS: Ambulating independently.  Accompanied by family.   Victoria Foley 71 y.o.  female with stage IV lung cancer/adenocarcinoma currently on single agent Beryle Flock is here for follow-up.  Denies any pain. Denies denies any nausea vomiting.  Denies any leg swelling.  Denies any worsening short of breath or cough.   Review of Systems  Constitutional:  Positive for malaise/fatigue. Negative for chills, diaphoresis, fever and weight loss.  HENT:  Negative for nosebleeds and sore throat.   Eyes:  Negative for double vision.  Respiratory:  Negative for cough, hemoptysis, sputum production, shortness of breath and  wheezing.   Cardiovascular:  Negative for chest pain, palpitations, orthopnea and leg swelling.  Gastrointestinal:  Negative for abdominal pain, blood in stool,  constipation, diarrhea, heartburn, melena, nausea and vomiting.  Genitourinary:  Negative for dysuria, frequency and urgency.  Musculoskeletal:  Negative for back pain and joint pain.  Skin: Negative.  Negative for itching and rash.  Neurological:  Negative for dizziness, tingling, focal weakness, weakness and headaches.  Endo/Heme/Allergies:  Does not bruise/bleed easily.  Psychiatric/Behavioral:  Negative for depression. The patient is not nervous/anxious and does not have insomnia.     MEDICAL HISTORY:  Past Medical History:  Diagnosis Date   Anemia    Anxiety    Arthritis    COVID-19    03/2021   Hypertension    Pneumonia    Pre-diabetes    Prediabetes    Sciatica     SURGICAL HISTORY: Past Surgical History:  Procedure Laterality Date   CESAREAN SECTION     COLONOSCOPY W/ POLYPECTOMY     ECTOPIC PREGNANCY SURGERY     FEMUR SURGERY     left, s/p rod for fracture   IR IMAGING GUIDED PORT INSERTION  12/11/2021   TUBAL LIGATION      SOCIAL HISTORY: Social History   Socioeconomic History   Marital status: Widowed    Spouse name: Not on file   Number of children: Not on file   Years of education: Not on file   Highest education level: Not on file  Occupational History   Not on file  Tobacco Use   Smoking status: Never   Smokeless tobacco: Never  Vaping Use   Vaping Use: Never used  Substance and Sexual Activity   Alcohol use: No   Drug use: Never   Sexual activity: Not Currently  Other Topics Concern   Not on file  Social History Narrative   Lives in [place] but husband died Jun 02, 2020 motorcycle accident bday 10/19/20 married 33 years   No pets   2 daughters and 3 granddaughters      Work - [employer] retired 09/2019   Diet - regular diet   Exercise - none   Social Determinants of Health   Financial Resource Strain: Low Risk  (12/20/2021)   Overall Financial Resource Strain (CARDIA)    Difficulty of Paying Living Expenses: Not hard at all  Food  Insecurity: No Food Insecurity (12/20/2021)   Hunger Vital Sign    Worried About Running Out of Food in the Last Year: Never true    [place] in the Last Year: Never true  Transportation Needs: No Transportation Needs (12/20/2021)   PRAPARE - Hydrologist (Medical): No    Lack of Transportation (Non-Medical): No  Physical Activity: Not on file  Stress: No Stress Concern Present (12/20/2021)   [place]    Feeling of Stress : Not at all  Social Connections: Unknown (12/19/2020)   Social Connection and Isolation Panel [NHANES]    Frequency of Communication with Friends and Family: More than three times a week    Frequency of Social Gatherings with Friends and Family: Not on file    Attends Religious Services: Not on file    Active Member of Clubs or Organizations: Not on file    Attends Archivist Meetings: Not on file    Marital Status: Not on file  Intimate Partner Violence: Not At Risk (12/20/2021)   Humiliation, Afraid,  Rape, and Kick questionnaire    Fear of Current or Ex-Partner: No    Emotionally Abused: No    Physically Abused: No    Sexually Abused: No    FAMILY HISTORY: Family History  Problem Relation Age of Onset   Diabetes Mother    Hypertension Mother    Heart disease Mother    Cancer Mother        breast and stomach   Breast cancer Mother 52   Diabetes Father    Hypertension Father    Heart disease Father    Kidney disease Father     ALLERGIES:  is allergic to diclofenac, lisinopril, and sulfa antibiotics.  MEDICATIONS:  Current Outpatient Medications  Medication Sig Dispense Refill   albuterol (VENTOLIN HFA) 108 (90 Base) MCG/ACT inhaler Inhale 2 puffs into the lungs every 6 (six) hours as needed for wheezing or shortness of breath. 18 g 0   Azelastine HCl 137 MCG/SPRAY SOLN Place 2 sprays into both nostrils 2 (two) times daily.      lidocaine-prilocaine (EMLA) cream Apply to port site 30 mins prior using once 30 g 3   losartan (COZAAR) 100 MG tablet Take 1 tablet (100 mg total) by mouth daily. 90 tablet 3   nebivolol (BYSTOLIC) 2.5 MG tablet Take 1 tablet (2.5 mg total) by mouth daily. 90 tablet 3   benzonatate (TESSALON) 200 MG capsule Take 1 capsule (200 mg total) by mouth 3 (three) times daily as needed for cough. (Patient not taking: Reported on 04/17/2022) 30 capsule 0   Chromium Picolinate (CHROMIUM PICOLATE PO) Take by mouth daily. (Patient not taking: Reported on 01/21/2022)     dexamethasone (DECADRON) 4 MG tablet Starting next cycle, take 4 mg two times a day, a day prior to chemotherapy. Do not take on the day of chemotherapy. Then take for one day after chemotherapy. (Patient not taking: Reported on 06/04/2022) 20 tablet 0   fluticasone (FLONASE) 50 MCG/ACT nasal spray USE TWO SPRAYS IN EACH NOSTRIL ONCE DAILY prn (Patient not taking: Reported on 33/54/5625) 16 g 12   folic acid (FOLVITE) 1 MG tablet Take 1 tablet (1 mg total) by mouth daily. Start 7 days before pemetrexed chemotherapy. Continue until 21 days after pemetrexed completed. (Patient not taking: Reported on 06/04/2022) 100 tablet 3   loratadine (CLARITIN) 10 MG tablet TAKE ONE TABLET BY MOUTH DAILY AS NEEDED FOR ALLERGIES (Patient not taking: Reported on 04/17/2022) 90 tablet 3   Multiple Vitamin (ONE-DAILY MULTI-VITAMIN PO) Take by mouth daily. (Patient not taking: Reported on 04/17/2022)     Omega-3 Fatty Acids (FISH OIL) 1000 MG CAPS Take by mouth. (Patient not taking: Reported on 01/21/2022)     omeprazole (PRILOSEC) 20 MG capsule Take 1 capsule (20 mg total) by mouth daily. (Patient not taking: Reported on 01/21/2022) 10 capsule 0   ondansetron (ZOFRAN) 8 MG tablet Take 1 tablet (8 mg total) by mouth every 8 (eight) hours as needed for nausea or vomiting. Start on the third day after carboplatin. (Patient not taking: Reported on 04/17/2022) 30 tablet 1    prochlorperazine (COMPAZINE) 10 MG tablet Take 1 tablet (10 mg total) by mouth every 6 (six) hours as needed for nausea or vomiting. (Patient not taking: Reported on 12/18/2021) 30 tablet 1   Turmeric (QC TUMERIC COMPLEX PO) Take 2 tablets by mouth daily. (Patient not taking: Reported on 11/28/2021)     No current facility-administered medications for this visit.   Facility-Administered Medications Ordered in Other Visits  Medication Dose Route Frequency Provider Last Rate Last Admin   0.9 %  sodium chloride infusion   Intravenous Once Jane Canary, MD       heparin lock flush 100 UNIT/ML injection            heparin lock flush 100 unit/mL  500 Units Intravenous Once Jane Canary, MD       heparin lock flush 100 unit/mL  500 Units Intracatheter Once PRN Jane Canary, MD       pembrolizumab [facility]) 200 mg in sodium chloride 0.9 % 50 mL chemo infusion  200 mg Intravenous Once Jane Canary, MD       sodium chloride flush (NS) 0.9 % injection 10 mL  10 mL Intravenous Once Jane Canary, MD        PHYSICAL EXAMINATION:   Vitals:   06/04/22 1100  BP: 138/83  Pulse: (!) 101  Resp: 18  Temp: (!) 97.5 F (36.4 C)   Filed Weights   06/04/22 1100  Weight: 141 lb 11.2 oz (64.3 kg)    Physical Exam Vitals and nursing note reviewed.  HENT:     Head: Normocephalic and atraumatic.     Mouth/Throat:     Pharynx: Oropharynx is clear.  Eyes:     Extraocular Movements: Extraocular movements intact.     Pupils: Pupils are equal, round, and reactive to light.  Cardiovascular:     Rate and Rhythm: Normal rate and regular rhythm.  Pulmonary:     Comments: Decreased breath sounds bilaterally.  Abdominal:     Palpations: Abdomen is soft.  Musculoskeletal:        General: Normal range of motion.     Cervical back: Normal range of motion.  Skin:    General: Skin is warm.  Neurological:     General: No focal deficit present.     Mental Status: She is alert and oriented to  person, place, and time.  Psychiatric:        Behavior: Behavior normal.        Judgment: Judgment normal.     LABORATORY DATA:  I have reviewed the data as listed Lab Results  Component Value Date   WBC 2.9 (L) 06/04/2022   HGB 9.9 (L) 06/04/2022   HCT 29.3 (L) 06/04/2022   MCV 100.3 (H) 06/04/2022   PLT 144 (L) 06/04/2022   Recent Labs    04/30/22 0840 05/14/22 0920 06/04/22 1037  NA 138 136 137  K 4.1 4.1 4.0  CL 111 107 109  CO2 20* 19* 21*  GLUCOSE 101* 104* 116*  BUN 30* 32* 27*  CREATININE 1.80* 1.69* 1.76*  CALCIUM 8.8* 8.9 9.0  GFRNONAA 30* 32* 31*  PROT 6.7 7.1 7.1  ALBUMIN 3.4* 3.4* 3.4*  AST 38 31 31  ALT 34 20 19  ALKPHOS 168* 169* 174*  BILITOT 0.7 0.4 0.5    RADIOGRAPHIC STUDIES: I have personally reviewed the radiological images as listed and agreed with the findings in the report. No results found.   Primary malignant neoplasm of left upper lobe of lung ([place])  # Primary lung adenocarcinoma ([place]), Stage IV, PDL1 1% [11/19/2021 after transbronchial biopsy of LUL nodule by Dr. Patsey Berthold.  Metastatic to pelvis, bilateral hips R>L and numerous pulmonary nodules. MRI brain showed no intracranial mets. 1 cm indeterminate left frontal calvarium lesion. Monitor.  Status post carbo Alimta; maintenance Alimta Keytruda.  Alimta discontinued on 04/23/2022 due to worsening renal function, hematologic toxicity requiring multiple blood transfusions and elevated  LFTs despite dose reductions. Restaging from Nov 2023 -CT chest abdomen pelvis showed good response to treatment.    # Patient currently on single agent keytruda every 3 weeks. Tolerating well. Labs reviewed and acceptable. Proceed with cycle 8 today.  Awaiting repeat CT chest/abdo/pelvis end of Feb 2024.  Will DC B12 orders.  #AKI-  likely secondary to Alimta. GFR-30s. - Patient is scheduled to see Nephrology 2/28. stable  # Normocytic anemia: -Likely secondary to chemotherapy and iron panel consistent with  anemia of chronic disease.  Overall stable.  # Transaminitis: -LFTs improved after discontinuation of Alimta.  Elevation of alkaline phosphatase.  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 Donella Stade on 12/18/2021; -Was seen by [facility] orthopedics.  No surgical intervention needed. Stable.   # Access - port  # DISPOSITION:  # Keytruda today; but wait for CMP.  # as per IS- RTC in 3 weeks for Dr. Loni Muse, labs, Beryle Flock- Dr.B     Above plan of care was discussed with patient/family in detail.  My contact information was given to the patient/family.       Cammie Sickle, MD 06/04/2022 12:50 PM

## 2022-06-04 NOTE — Patient Instructions (Signed)

## 2022-06-04 NOTE — Assessment & Plan Note (Addendum)
#   Primary lung adenocarcinoma ([place]), Stage IV, PDL1 1% [11/19/2021 after transbronchial biopsy of LUL nodule by Dr. Patsey Berthold.  Metastatic to pelvis, bilateral hips R>L and numerous pulmonary nodules. MRI brain showed no intracranial mets. 1 cm indeterminate left frontal calvarium lesion. Monitor.  Status post carbo Alimta; maintenance Alimta Keytruda.  Alimta discontinued on 04/23/2022 due to worsening renal function, hematologic toxicity requiring multiple blood transfusions and elevated LFTs despite dose reductions. Restaging from Nov 2023 -CT chest abdomen pelvis showed good response to treatment.    # Patient currently on single agent keytruda every 3 weeks. Tolerating well. Labs reviewed and acceptable. Proceed with cycle 8 today.  Awaiting repeat CT chest/abdo/pelvis end of Feb 2024.  Will DC B12 orders.  #AKI-  likely secondary to Alimta. GFR-30s. - Patient is scheduled to see Nephrology 2/28. stable  # Normocytic anemia: -Likely secondary to chemotherapy and iron panel consistent with anemia of chronic disease.  Overall stable.  # Transaminitis: -LFTs improved after discontinuation of Alimta.  Elevation of alkaline phosphatase.  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 Donella Stade on 12/18/2021; -Was seen by [facility] orthopedics.  No surgical intervention needed. Stable.   # Access - port  # DISPOSITION:  # Keytruda today; but wait for CMP.  # as per IS- RTC in 3 weeks for Dr. Loni Muse, labs, Beryle Flock- Dr.B

## 2022-06-04 NOTE — Patient Instructions (Signed)
[facility]  Discharge Instructions: Thank you for choosing [facility] to provide your oncology and hematology care.  If you have a lab appointment with the [facility], please go directly to the [facility] and check in at the registration area.  Wear comfortable clothing and clothing appropriate for easy access to any Portacath or PICC line.   We strive to give you quality time with your provider. You may need to reschedule your appointment if you arrive late (15 or more minutes).  Arriving late affects you and other patients whose appointments are after yours.  Also, if you miss three or more appointments without notifying the office, you may be dismissed from the clinic at the provider's discretion.      For prescription refill requests, have your pharmacy contact our office and allow 72 hours for refills to be completed.    Today you received the following chemotherapy and/or immunotherapy agents- Keytruda      To help prevent nausea and vomiting after your treatment, we encourage you to take your nausea medication as directed.  BELOW ARE SYMPTOMS THAT SHOULD BE REPORTED IMMEDIATELY: *FEVER GREATER THAN 100.4 F (38 C) OR HIGHER *CHILLS OR SWEATING *NAUSEA AND VOMITING THAT IS NOT CONTROLLED WITH YOUR NAUSEA MEDICATION *UNUSUAL SHORTNESS OF BREATH *UNUSUAL BRUISING OR BLEEDING *URINARY PROBLEMS (pain or burning when urinating, or frequent urination) *BOWEL PROBLEMS (unusual diarrhea, constipation, pain near the anus) TENDERNESS IN MOUTH AND THROAT WITH OR WITHOUT PRESENCE OF ULCERS (sore throat, sores in mouth, or a toothache) UNUSUAL RASH, SWELLING OR PAIN  UNUSUAL VAGINAL DISCHARGE OR ITCHING   Items with * indicate a potential emergency and should be followed up as soon as possible or go to the Emergency Department if any problems should occur.  Please show the CHEMOTHERAPY ALERT CARD or IMMUNOTHERAPY ALERT CARD at check-in to  the Emergency Department and triage nurse.  Should you have questions after your visit or need to cancel or reschedule your appointment, please contact [facility]  [phone] and follow the prompts.  Office hours are 8:00 a.m. to 4:30 p.m. Monday - Friday. Please note that voicemails left after 4:00 p.m. may not be returned until the following business day.  We are closed weekends and major holidays. You have access to a nurse at all times for urgent questions. Please call the main number to the clinic [phone] and follow the prompts.  For any non-urgent questions, you may also contact your provider using MyChart. We now offer e-Visits for anyone 47 and older to request care online for non-urgent symptoms. For details visit [email or web address].   Also download the MyChart app! Go to the app store, search "MyChart", open the app, select [facility], and log in with your MyChart username and password.

## 2022-06-04 NOTE — Progress Notes (Signed)
Patient denies new problems/concerns today.   

## 2022-06-05 ENCOUNTER — Other Ambulatory Visit: Payer: Self-pay

## 2022-06-05 LAB — THYROID PANEL WITH TSH
Free Thyroxine Index: 2.1 (ref 1.2–4.9)
T3 Uptake Ratio: 29 % (ref 24–39)
T4, Total: 7.3 ug/dL (ref 4.5–12.0)
TSH: 2.32 u[IU]/mL (ref 0.450–4.500)

## 2022-06-07 ENCOUNTER — Other Ambulatory Visit: Payer: Self-pay

## 2022-06-13 ENCOUNTER — Other Ambulatory Visit: Payer: Self-pay

## 2022-06-14 ENCOUNTER — Ambulatory Visit
Admission: RE | Admit: 2022-06-14 | Discharge: 2022-06-14 | Disposition: A | Discharge status: Home / Self Care | Payer: PPO | Source: Ambulatory Visit | Attending: Internal Medicine | Admitting: Internal Medicine

## 2022-06-14 DIAGNOSIS — C3490 Malignant neoplasm of unspecified part of unspecified bronchus or lung: Secondary | ICD-10-CM | POA: Diagnosis present

## 2022-06-15 ENCOUNTER — Ambulatory Visit: Admit: 2022-06-15 | Discharge status: Against Medical Advice | Payer: PPO

## 2022-06-19 DIAGNOSIS — N1832 Chronic kidney disease, stage 3b: Secondary | ICD-10-CM | POA: Insufficient documentation

## 2022-06-19 DIAGNOSIS — M5430 Sciatica, unspecified side: Secondary | ICD-10-CM | POA: Insufficient documentation

## 2022-06-19 DIAGNOSIS — C3490 Malignant neoplasm of unspecified part of unspecified bronchus or lung: Secondary | ICD-10-CM | POA: Insufficient documentation

## 2022-06-19 DIAGNOSIS — N281 Cyst of kidney, acquired: Secondary | ICD-10-CM | POA: Insufficient documentation

## 2022-06-19 DIAGNOSIS — D631 Anemia in chronic kidney disease: Secondary | ICD-10-CM | POA: Insufficient documentation

## 2022-06-19 DIAGNOSIS — R8290 Unspecified abnormal findings in urine: Secondary | ICD-10-CM

## 2022-06-19 HISTORY — DX: Unspecified abnormal findings in urine: R82.90

## 2022-06-25 ENCOUNTER — Inpatient Hospital Stay (HOSPITAL_BASED_OUTPATIENT_CLINIC_OR_DEPARTMENT_OTHER): Payer: PPO | Admitting: Internal Medicine

## 2022-06-25 ENCOUNTER — Encounter: Payer: Self-pay | Admitting: Internal Medicine

## 2022-06-25 ENCOUNTER — Inpatient Hospital Stay: Payer: PPO

## 2022-06-25 ENCOUNTER — Inpatient Hospital Stay: Payer: PPO | Attending: Internal Medicine

## 2022-06-25 VITALS — BP 137/90 | HR 83 | Temp 97.6°F | Resp 16 | Wt 140.0 lb

## 2022-06-25 DIAGNOSIS — R609 Edema, unspecified: Secondary | ICD-10-CM | POA: Diagnosis not present

## 2022-06-25 DIAGNOSIS — J432 Centrilobular emphysema: Secondary | ICD-10-CM | POA: Diagnosis not present

## 2022-06-25 DIAGNOSIS — N179 Acute kidney failure, unspecified: Secondary | ICD-10-CM | POA: Diagnosis not present

## 2022-06-25 DIAGNOSIS — C3412 Malignant neoplasm of upper lobe, left bronchus or lung: Secondary | ICD-10-CM | POA: Diagnosis not present

## 2022-06-25 DIAGNOSIS — Z5112 Encounter for antineoplastic immunotherapy: Secondary | ICD-10-CM | POA: Insufficient documentation

## 2022-06-25 DIAGNOSIS — C3490 Malignant neoplasm of unspecified part of unspecified bronchus or lung: Secondary | ICD-10-CM

## 2022-06-25 DIAGNOSIS — T451X5A Adverse effect of antineoplastic and immunosuppressive drugs, initial encounter: Secondary | ICD-10-CM

## 2022-06-25 DIAGNOSIS — D6481 Anemia due to antineoplastic chemotherapy: Secondary | ICD-10-CM

## 2022-06-25 DIAGNOSIS — D734 Cyst of spleen: Secondary | ICD-10-CM | POA: Diagnosis not present

## 2022-06-25 DIAGNOSIS — Z79899 Other long term (current) drug therapy: Secondary | ICD-10-CM | POA: Diagnosis not present

## 2022-06-25 DIAGNOSIS — D649 Anemia, unspecified: Secondary | ICD-10-CM | POA: Diagnosis not present

## 2022-06-25 DIAGNOSIS — C7951 Secondary malignant neoplasm of bone: Secondary | ICD-10-CM | POA: Diagnosis not present

## 2022-06-25 LAB — COMPREHENSIVE METABOLIC PANEL
ALT: 19 U/L (ref 0–44)
AST: 27 U/L (ref 15–41)
Albumin: 3.5 g/dL (ref 3.5–5.0)
Alkaline Phosphatase: 195 U/L — ABNORMAL HIGH (ref 38–126)
Anion gap: 7 (ref 5–15)
BUN: 31 mg/dL — ABNORMAL HIGH (ref 8–23)
CO2: 21 mmol/L — ABNORMAL LOW (ref 22–32)
Calcium: 9.1 mg/dL (ref 8.9–10.3)
Chloride: 109 mmol/L (ref 98–111)
Creatinine, Ser: 1.68 mg/dL — ABNORMAL HIGH (ref 0.44–1.00)
GFR, Estimated: 33 mL/min — ABNORMAL LOW (ref >60)
Glucose, Bld: 97 mg/dL (ref 70–99)
Potassium: 4.2 mmol/L (ref 3.5–5.1)
Sodium: 137 mmol/L (ref 135–145)
Total Bilirubin: 0.4 mg/dL (ref 0.3–1.2)
Total Protein: 7.4 g/dL (ref 6.5–8.1)

## 2022-06-25 LAB — CBC WITH DIFFERENTIAL/PLATELET
Abs Immature Granulocytes: 0 10*3/uL (ref 0.00–0.07)
Basophils Absolute: 0 10*3/uL (ref 0.0–0.1)
Basophils Relative: 1 %
Eosinophils Absolute: 0 10*3/uL (ref 0.0–0.5)
Eosinophils Relative: 1 %
HCT: 29.6 % — ABNORMAL LOW (ref 36.0–46.0)
Hemoglobin: 9.8 g/dL — ABNORMAL LOW (ref 12.0–15.0)
Immature Granulocytes: 0 %
Lymphocytes Relative: 33 %
Lymphs Abs: 0.8 10*3/uL (ref 0.7–4.0)
MCH: 33.8 pg (ref 26.0–34.0)
MCHC: 33.1 g/dL (ref 30.0–36.0)
MCV: 102.1 fL — ABNORMAL HIGH (ref 80.0–100.0)
Monocytes Absolute: 0.3 10*3/uL (ref 0.1–1.0)
Monocytes Relative: 11 %
Neutro Abs: 1.3 10*3/uL — ABNORMAL LOW (ref 1.7–7.7)
Neutrophils Relative %: 54 %
Platelets: 181 10*3/uL (ref 150–400)
RBC: 2.9 MIL/uL — ABNORMAL LOW (ref 3.87–5.11)
RDW: 13.8 % (ref 11.5–15.5)
WBC: 2.4 10*3/uL — ABNORMAL LOW (ref 4.0–10.5)
nRBC: 0 % (ref 0.0–0.2)

## 2022-06-25 MED ORDER — SODIUM CHLORIDE 0.9 % IV SOLN
Freq: Once | INTRAVENOUS | Status: AC
  Filled 2022-06-25: qty 250

## 2022-06-25 MED ORDER — SODIUM CHLORIDE 0.9 % IV SOLN
200.0000 mg | Freq: Once | INTRAVENOUS | Status: AC
  Administered 2022-06-25: 200 mg via INTRAVENOUS
  Filled 2022-06-25: qty 8

## 2022-06-25 MED ORDER — SODIUM CHLORIDE 0.9% FLUSH
10.0000 mL | INTRAVENOUS | Status: DC | PRN
  Administered 2022-06-25: 10 mL via INTRAVENOUS
  Filled 2022-06-25: qty 10

## 2022-06-25 MED ORDER — HEPARIN SOD (PORK) LOCK FLUSH 100 UNIT/ML IV SOLN
500.0000 [IU] | Freq: Once | INTRAVENOUS | Status: AC | PRN
  Administered 2022-06-25: 500 [IU]
  Filled 2022-06-25: qty 5

## 2022-06-25 NOTE — Progress Notes (Signed)
Pt and caregiver are in for follow up, denies any concerns today.

## 2022-06-25 NOTE — Progress Notes (Signed)
[place] OFFICE PROGRESS NOTE  Patient Care Team: Earlie Server, MD as PCP - General (Oncology) Telford Nab, RN as Oncology Nurse Navigator  TREATMENT:  Right hip palliative RT 30 cGy completed 12/18/2021 Carboplatin, alimta and Keytruda completed 02/19/22. Started maintenance alimta, keytruda  ASSESSMENT & PLAN:  # Primary lung adenocarcinoma ([place]), Stage IV, PDL1 1% -Diagnosed 11/19/2021 after transbronchial biopsy of LUL nodule by Dr. Patsey Berthold.  Metastatic to pelvis, bilateral hips R>L and numerous pulmonary nodules. MRI brain showed no intracranial mets. 1 cm indeterminate left frontal calvarium lesion. Monitor.   -Sought second opinion with Dr. Varney Biles at [facility] who agreed with our treatment plan.    -Foundation 1 testing showed PD-L1 1%.  No other targetable mutations.  Report below  -Restaging from Nov 2023 -CT chest abdomen pelvis showed good response to treatment.    -discontinued maintenance Alimta on 04/23/2022 due to worsening renal function, hematologic toxicity requiring multiple blood transfusions and elevated LFTs despite dose reductions.   -Discussed CT chest abdomen pelvis showed mildly improved to stable disease.  Continue with maintenance Keytruda.  Labs reviewed.  WBC of 2.7 with ANC 1.3 noted.  Immunotherapy can occasionally cause immune related cytopenias.  In her case it could be lingering effects from the chemotherapy and the radiation to her right hip.  I will continue with Keytruda 200 mg as planned and closely monitor counts.  #AKI - likely secondary to Alimta. Cr stabilized.   # Normocytic anemia  -Likely secondary to chemotherapy and iron panel consistent with anemia of chronic disease. -improving since off chemotherapy.   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 Donella Stade on 12/18/2021 -Was seen by [facility].  No surgical intervention needed.  # Access - port  RTC in 3 weeks for MD  visit, labs, Keytruda  Orders Placed This Encounter  Procedures   Thyroid Panel With TSH    Standing Status:   Future    Standing Expiration Date:   07/16/2023   CBC with Differential    Standing Status:   Future    Standing Expiration Date:   07/16/2023   Comprehensive metabolic panel    Standing Status:   Future    Standing Expiration Date:   07/16/2023   Thyroid Panel With TSH    Standing Status:   Future    Standing Expiration Date:   08/06/2023   CBC with Differential    Standing Status:   Future    Standing Expiration Date:   08/06/2023   Comprehensive metabolic panel    Standing Status:   Future    Standing Expiration Date:   08/06/2023   Thyroid Panel With TSH    Standing Status:   Future    Standing Expiration Date:   08/27/2023   CBC with Differential    Standing Status:   Future    Standing Expiration Date:   08/27/2023   Comprehensive metabolic panel    Standing Status:   Future    Standing Expiration Date:   08/27/2023   [facility]- 12/06/2021   All questions were answered. The patient knows to call the clinic with any problems, questions or concerns. The total time spent in the appointment was 30 minutes encounter with patients including review of chart and various tests results, discussions about plan of care and coordination of care plan   Jane Canary, MD 06/25/2022 12:28 PM  INTERVAL HISTORY: Please see below for problem oriented charting.  Patient following with [place] for treatment of stage IV lung adenocarcinoma.  Currently on Keytruda maintenance.  Patient seen today accompanied with daughter.  Tolerating treatments well.  Last week she had left hip sprain and was on bedrest for few days.  Was evaluated by [facility].  Her pain has completely resolved now and is ambulating well.  Denies any right hip pain.  Appetite and energy levels are good.  Completed her PT sessions.  REVIEW OF SYSTEMS:   Positive ROS as above.  Rest 10 points ROS negative   I have  reviewed the past medical history, past surgical history, social history and family history with the patient and they are unchanged from previous note.  ALLERGIES:  is allergic to diclofenac, lisinopril, and sulfa antibiotics.  MEDICATIONS:  Current Outpatient Medications  Medication Sig Dispense Refill   albuterol (VENTOLIN HFA) 108 (90 Base) MCG/ACT inhaler Inhale 2 puffs into the lungs every 6 (six) hours as needed for wheezing or shortness of breath. 18 g 0   Azelastine HCl 137 MCG/SPRAY SOLN Place 2 sprays into both nostrils 2 (two) times daily.     Cholecalciferol (VITAMIN D-3 PO) Take by mouth.     fluticasone (FLONASE) 50 MCG/ACT nasal spray USE TWO SPRAYS IN EACH NOSTRIL ONCE DAILY prn 16 g 12   lidocaine-prilocaine (EMLA) cream Apply to port site 30 mins prior using once 30 g 3   loratadine (CLARITIN) 10 MG tablet TAKE ONE TABLET BY MOUTH DAILY AS NEEDED FOR ALLERGIES 90 tablet 3   losartan (COZAAR) 100 MG tablet Take 1 tablet (100 mg total) by mouth daily. 90 tablet 3   nebivolol (BYSTOLIC) 2.5 MG tablet Take 1 tablet (2.5 mg total) by mouth daily. 90 tablet 3   No current facility-administered medications for this visit.   Facility-Administered Medications Ordered in Other Visits  Medication Dose Route Frequency Provider Last Rate Last Admin   heparin lock flush 100 UNIT/ML injection            heparin lock flush 100 unit/mL  500 Units Intravenous Once Jane Canary, MD       heparin lock flush 100 unit/mL  500 Units Intracatheter Once PRN Cammie Sickle, MD       pembrolizumab [facility]) 200 mg in sodium chloride 0.9 % 50 mL chemo infusion  200 mg Intravenous Once Charlaine Dalton R, MD       sodium chloride flush (NS) 0.9 % injection 10 mL  10 mL Intravenous Once Jane Canary, MD       sodium chloride flush (NS) 0.9 % injection 10 mL  10 mL Intravenous PRN Jane Canary, MD   10 mL at 06/25/22 1024    SUMMARY OF ONCOLOGIC HISTORY: Oncology History  Primary  lung adenocarcinoma ([place])  10/10/2021 Initial Diagnosis   Primary lung adenocarcinoma ([place]) Stage IV  Patient seen by PCP for wheezing for 2 weeks. Imaging with CXR followed by CT chest showed Spiculated 2.1 x 2.2 cm left upper lobe pulmonary nodule with associated pleural tethering. Innumerable subcentimeter pulmonary nodules throughout the lungs.      11/14/2021 PET scan   IMPRESSION: 1. Left suprahilar nodule with maximum SUV 4.5, and a more distal 2.2 cm left upper lobe nodule with maximum SUV of 5.5. 2. Innumerable scattered small pulmonary nodules, most of which are under 5 mm in diameter, throughout both lungs. Some are cavitary. Possibilities include cavitating hematogenous disseminated malignancy versus infectious etiology subset as septic emboli. Most of the nodules are stable, some  are minimally enlarged compared to 10/10/2021. 3. Abnormal mild permeative type bony findings in the right hemipelvis associated with substantially accentuated metabolic activity. Possible associated pathologic fracture through the right quadrilateral plate and right acetabulum. The findings involve the right ilium and ischium but also the right lateral sacral ala and a small focus in the left sacrum.    Pathology Results   She had transbronchial biopsies by Dr. Patsey Berthold on 11/19/21.  Pathology from LUL nodule showed adenocarcinoma, consistent with lung primary.    11/19/2021 Cancer Staging   Staging form: Lung, AJCC 8th Edition - Clinical stage from 11/19/2021: Stage IVB (cT1c, cM1c) - Signed by Jane Canary, MD on 11/27/2021 Stage prefix: Initial diagnosis   11/27/2021 Imaging   MRI pelvis  IMPRESSION: 1. Abnormal marrow signal throughout the right ilium involving the right acetabulum, right superior pubic ramus and right inferior pubic ramus, right side of the sacrum and a smaller focus of abnormal marrow lesion in the left side of the sacrum. Findings are concerning for metastatic  disease. 2. Nondisplaced pathologic fracture of the quadrilateral plate of the right acetabulum. 3. Mild muscle edema in the right gluteus minimus and medius muscles likely reflecting mild muscle strain.  MRI brain  IMPRESSION: No evidence of intracranial metastatic disease.   Indeterminate 1 cm left frontal calvarium lesion.   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  HENT:     Head: Normocephalic and atraumatic.  Cardiovascular:     Rate and Rhythm: Normal rate.  Pulmonary:     Effort: Pulmonary effort is normal.  Musculoskeletal:     Right lower leg: No edema.     Left lower leg: No edema.  Skin:    General: Skin is warm.  Neurological:     General: No focal deficit present.     Mental Status: She is oriented to person, place, and time.  Psychiatric:        Mood and Affect: Mood normal.     LABORATORY DATA:  I have reviewed the data as listed    Component Value Date/Time   NA 137 06/25/2022 1024   K 4.2 06/25/2022 1024   CL 109 06/25/2022 1024   CO2 21 (L) 06/25/2022 1024   GLUCOSE 97 06/25/2022 1024   BUN 31 (H) 06/25/2022 1024   CREATININE 1.68 (H)  06/25/2022 1024   CALCIUM 9.1 06/25/2022 1024   PROT 7.4 06/25/2022 1024   ALBUMIN 3.5 06/25/2022 1024   AST 27 06/25/2022 1024   ALT 19 06/25/2022 1024   ALKPHOS 195 (H) 06/25/2022 1024  BILITOT 0.4 06/25/2022 1024   GFRNONAA 33 (L) 06/25/2022 1024    No results found for: "SPEP", "UPEP"  Lab Results  Component Value Date   WBC 2.4 (L) 06/25/2022   NEUTROABS 1.3 (L) 06/25/2022   HGB 9.8 (L) 06/25/2022   HCT 29.6 (L) 06/25/2022   MCV 102.1 (H) 06/25/2022   PLT 181 06/25/2022      Chemistry      Component Value Date/Time   NA 137 06/25/2022 1024   K 4.2 06/25/2022 1024   CL 109 06/25/2022 1024   CO2 21 (L) 06/25/2022 1024   BUN 31 (H) 06/25/2022 1024   CREATININE 1.68 (H) 06/25/2022 1024      Component Value Date/Time   CALCIUM 9.1 06/25/2022 1024   ALKPHOS 195 (H) 06/25/2022 1024   AST 27 06/25/2022 1024   ALT 19 06/25/2022 1024   BILITOT 0.4 06/25/2022 1024       RADIOGRAPHIC STUDIES: I have personally reviewed the radiological images as listed and agreed with the findings in the report. CT CHEST ABDOMEN PELVIS WO CONTRAST  Result Date: 06/17/2022 CLINICAL DATA:  Follow-up lung cancer, on immunotherapy, right hip metastases status post radiation EXAM: CT CHEST, ABDOMEN AND PELVIS WITHOUT CONTRAST TECHNIQUE: Multidetector CT imaging of the chest, abdomen and pelvis was performed following the standard protocol without IV contrast. RADIATION DOSE REDUCTION: This exam was performed according to the departmental dose-optimization program which includes automated exposure control, adjustment of the mA and/or kV according to patient size and/or use of iterative reconstruction technique. COMPARISON:  03/08/2022 FINDINGS: CT CHEST FINDINGS Cardiovascular: The heart is normal in size. No pericardial effusion. No evidence of thoracic aortic aneurysm. Right chest port terminates at the cavoatrial junction. Mediastinum/Nodes: No suspicious mediastinal or axillary lymphadenopathy.  Hilar regions are poorly evaluated in the absence of intravenous contrast administration. Visualized thyroid is unremarkable. Lungs/Pleura: 2.1 x 1.6 cm irregular left upper lobe mass (series 3/image 37), previously 2.5 x 2.0 cm, with surrounding radiation changes. Numerous scattered small bilateral pulmonary nodules, mildly improved. Index nodule in the left upper lung measures 4 mm (series 3/image 22), previously 5 mm. Mild centrilobular emphysematous changes, upper lung predominant. No focal consolidation. Mild scarring in the medial right lower lobe. No pleural effusion or pneumothorax. Musculoskeletal: Stable sclerotic metastasis in the right humeral head (coronal image 83). Otherwise, no focal osseous lesions. CT ABDOMEN PELVIS FINDINGS Hepatobiliary: Unenhanced liver is unremarkable. Layering numerous gallstones (series 2/image 84), without associated inflammatory changes. No intrahepatic or extrahepatic duct dilatation. Pancreas: Within normal limits. Spleen: 13 mm anterior splenic cyst (series 2/image 46), benign. No follow-up recommended. Adrenals/Urinary Tract: Adrenal glands are within normal limits. 17 mm right lower pole renal sinus cyst (series 2/image 62). Left kidney is within normal limits. No hydronephrosis. Bladder is mildly thick-walled although underdistended. Stomach/Bowel: Stomach is within normal limits. No evidence of bowel obstruction. Appendix is not discretely visualized. No colonic wall thickening or inflammatory changes. Vascular/Lymphatic: No evidence of abdominal aortic aneurysm. Atherosclerotic calcifications of the abdominal aorta and branch vessels. No suspicious abdominopelvic lymphadenopathy. Reproductive: Uterus is within normal limits. No adnexal masses. Other: No abdominopelvic ascites. Musculoskeletal: Multifocal sclerotic osseous metastases involving the lumbar spine, sacrum, and right greater than left pelvis. Stable pathologic fracture along the right acetabulum (coronal  image 57). Postprocedural changes in the left proximal femur. IMPRESSION: 2.1 cm left upper lobe mass, mildly improved. Surrounding radiation changes. Numerous scattered small bilateral pulmonary nodules, mildly improved. Multifocal sclerotic osseous metastases, grossly unchanged. Stable pathologic fracture along  the right acetabulum. Additional stable ancillary findings as above. Emphysema (ICD10-J43.9). Electronically Signed   By: Julian Hy M.D.   On: 06/17/2022 02:57

## 2022-06-26 ENCOUNTER — Other Ambulatory Visit: Payer: Self-pay

## 2022-06-27 LAB — THYROID PANEL WITH TSH
Free Thyroxine Index: 2 (ref 1.2–4.9)
T3 Uptake Ratio: 27 % (ref 24–39)
T4, Total: 7.3 ug/dL (ref 4.5–12.0)
TSH: 1.53 u[IU]/mL (ref 0.450–4.500)

## 2022-07-16 ENCOUNTER — Encounter: Payer: Self-pay | Admitting: Internal Medicine

## 2022-07-16 ENCOUNTER — Ambulatory Visit: Payer: PPO

## 2022-07-16 ENCOUNTER — Inpatient Hospital Stay: Payer: PPO

## 2022-07-16 ENCOUNTER — Inpatient Hospital Stay (HOSPITAL_BASED_OUTPATIENT_CLINIC_OR_DEPARTMENT_OTHER): Payer: PPO | Admitting: Internal Medicine

## 2022-07-16 ENCOUNTER — Ambulatory Visit: Payer: PPO | Admitting: Oncology

## 2022-07-16 ENCOUNTER — Other Ambulatory Visit: Payer: PPO

## 2022-07-16 VITALS — BP 146/89 | HR 80 | Temp 97.4°F | Resp 17 | Wt 149.0 lb

## 2022-07-16 DIAGNOSIS — D6481 Anemia due to antineoplastic chemotherapy: Secondary | ICD-10-CM

## 2022-07-16 DIAGNOSIS — Z5112 Encounter for antineoplastic immunotherapy: Secondary | ICD-10-CM

## 2022-07-16 DIAGNOSIS — C3490 Malignant neoplasm of unspecified part of unspecified bronchus or lung: Secondary | ICD-10-CM | POA: Diagnosis not present

## 2022-07-16 DIAGNOSIS — R748 Abnormal levels of other serum enzymes: Secondary | ICD-10-CM | POA: Diagnosis not present

## 2022-07-16 DIAGNOSIS — T451X5A Adverse effect of antineoplastic and immunosuppressive drugs, initial encounter: Secondary | ICD-10-CM

## 2022-07-16 LAB — COMPREHENSIVE METABOLIC PANEL
ALT: 18 U/L (ref 0–44)
AST: 28 U/L (ref 15–41)
Albumin: 3.4 g/dL — ABNORMAL LOW (ref 3.5–5.0)
Alkaline Phosphatase: 219 U/L — ABNORMAL HIGH (ref 38–126)
Anion gap: 4 — ABNORMAL LOW (ref 5–15)
BUN: 34 mg/dL — ABNORMAL HIGH (ref 8–23)
CO2: 22 mmol/L (ref 22–32)
Calcium: 8.8 mg/dL — ABNORMAL LOW (ref 8.9–10.3)
Chloride: 109 mmol/L (ref 98–111)
Creatinine, Ser: 1.62 mg/dL — ABNORMAL HIGH (ref 0.44–1.00)
GFR, Estimated: 34 mL/min — ABNORMAL LOW (ref >60)
Glucose, Bld: 98 mg/dL (ref 70–99)
Potassium: 4.1 mmol/L (ref 3.5–5.1)
Sodium: 135 mmol/L (ref 135–145)
Total Bilirubin: 0.4 mg/dL (ref 0.3–1.2)
Total Protein: 7.3 g/dL (ref 6.5–8.1)

## 2022-07-16 LAB — CBC WITH DIFFERENTIAL/PLATELET
Abs Immature Granulocytes: 0 10*3/uL (ref 0.00–0.07)
Basophils Absolute: 0 10*3/uL (ref 0.0–0.1)
Basophils Relative: 1 %
Eosinophils Absolute: 0.1 10*3/uL (ref 0.0–0.5)
Eosinophils Relative: 2 %
HCT: 30.7 % — ABNORMAL LOW (ref 36.0–46.0)
Hemoglobin: 10.2 g/dL — ABNORMAL LOW (ref 12.0–15.0)
Immature Granulocytes: 0 %
Lymphocytes Relative: 27 %
Lymphs Abs: 0.7 10*3/uL (ref 0.7–4.0)
MCH: 33.6 pg (ref 26.0–34.0)
MCHC: 33.2 g/dL (ref 30.0–36.0)
MCV: 101 fL — ABNORMAL HIGH (ref 80.0–100.0)
Monocytes Absolute: 0.4 10*3/uL (ref 0.1–1.0)
Monocytes Relative: 14 %
Neutro Abs: 1.5 10*3/uL — ABNORMAL LOW (ref 1.7–7.7)
Neutrophils Relative %: 56 %
Platelets: 180 10*3/uL (ref 150–400)
RBC: 3.04 MIL/uL — ABNORMAL LOW (ref 3.87–5.11)
RDW: 12.9 % (ref 11.5–15.5)
WBC: 2.7 10*3/uL — ABNORMAL LOW (ref 4.0–10.5)
nRBC: 0 % (ref 0.0–0.2)

## 2022-07-16 MED ORDER — HEPARIN SOD (PORK) LOCK FLUSH 100 UNIT/ML IV SOLN
500.0000 [IU] | Freq: Once | INTRAVENOUS | Status: AC | PRN
  Administered 2022-07-16: 500 [IU]
  Filled 2022-07-16: qty 5

## 2022-07-16 MED ORDER — SODIUM CHLORIDE 0.9 % IV SOLN
200.0000 mg | Freq: Once | INTRAVENOUS | Status: AC
  Administered 2022-07-16: 200 mg via INTRAVENOUS
  Filled 2022-07-16: qty 8

## 2022-07-16 MED ORDER — LIDOCAINE-PRILOCAINE 2.5-2.5 % EX CREA: TOPICAL_CREAM | CUTANEOUS | 3 refills | Status: DC

## 2022-07-16 MED ORDER — SODIUM CHLORIDE 0.9 % IV SOLN
Freq: Once | INTRAVENOUS | Status: AC
  Filled 2022-07-16: qty 250

## 2022-07-16 NOTE — Progress Notes (Signed)
[place] OFFICE PROGRESS NOTE  Patient Care Team: Telford Nab, RN as Oncology Nurse Navigator Jane Canary, MD as Consulting Physician (Oncology)  TREATMENT:  Right hip palliative RT 30 cGy completed 12/18/2021 Carboplatin, alimta and Keytruda completed 02/19/22. Started maintenance alimta, keytruda  ASSESSMENT & PLAN:  # Primary lung adenocarcinoma ([place]), Stage IV, PDL1 1% -Diagnosed 11/19/2021 after transbronchial biopsy of LUL nodule by Dr. Patsey Berthold.  Metastatic to pelvis, bilateral hips R>L and numerous pulmonary nodules. MRI brain showed no intracranial mets. 1 cm indeterminate left frontal calvarium lesion. Monitor.   -Sought second opinion with Dr. Varney Biles at [facility] who agreed with our treatment plan.    -Foundation 1 testing showed PD-L1 1%.  No other targetable mutations.  Report below  -Restaging from Nov 2023 -CT chest abdomen pelvis showed good response to treatment.    -discontinued maintenance Alimta on 04/23/2022 due to worsening renal function, hematologic toxicity requiring multiple blood transfusions and elevated LFTs despite dose reductions.   -Labs reviewed and acceptable for treatment. Will proceed with cycle 11 of maintenance Keytruda. Elevated ALP noted. Has chronic elevation in 180-190. Today in 200. Will add GGT next time. Last scan from Feb 2024 showed stable to improved disease. Plan to repeat CT c/a/p end of May 2024.   #AKI - likely secondary to Alimta. Cr stabilized.   # Normocytic anemia  -Likely secondary to chemotherapy and iron panel consistent with anemia of chronic disease. -improving since off chemotherapy.   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 Donella Stade on 12/18/2021 -Was seen by [facility].  No surgical intervention needed.  # Access - port  RTC in 3 weeks for MD visit, labs, Keytruda  Orders Placed This Encounter  Procedures   Gamma GT    Standing Status:    Future    Standing Expiration Date:   07/16/2023   Thyroid Panel With TSH    Standing Status:   Future    Standing Expiration Date:   09/17/2023   CBC with Differential    Standing Status:   Future    Standing Expiration Date:   09/17/2023   Comprehensive metabolic panel    Standing Status:   Future    Standing Expiration Date:   09/17/2023   [facility]- 12/06/2021   All questions were answered. The patient knows to call the clinic with any problems, questions or concerns. The total time spent in the appointment was 30 minutes encounter with patients including review of chart and various tests results, discussions about plan of care and coordination of care plan   Jane Canary, MD 07/16/2022 1:39 PM  INTERVAL HISTORY: Please see below for problem oriented charting.  Patient following with [place] for treatment of stage IV lung adenocarcinoma.  Currently on Keytruda maintenance.  Patient seen today accompanied with daughter.  Tolerating treatments well. Has right hip stiffness. Exercise helps. Has stationery at home. Tolerating treatments well.    REVIEW OF SYSTEMS:   Positive ROS as above.  Rest 10 points ROS negative   I have reviewed the past medical history, past surgical history, social history and family history with the patient and they are unchanged from previous note.  ALLERGIES:  is allergic to diclofenac, lisinopril, and sulfa antibiotics.  MEDICATIONS:  Current Outpatient Medications  Medication Sig Dispense Refill   albuterol (VENTOLIN HFA) 108 (90 Base) MCG/ACT inhaler Inhale 2 puffs into the lungs every 6 (six) hours as needed for wheezing  or shortness of breath. 18 g 0   Azelastine HCl 137 MCG/SPRAY SOLN Place 2 sprays into both nostrils 2 (two) times daily.     Cholecalciferol (VITAMIN D-3 PO) Take by mouth.     fluticasone (FLONASE) 50 MCG/ACT nasal spray USE TWO SPRAYS IN EACH NOSTRIL ONCE DAILY prn 16 g 12   loratadine (CLARITIN) 10 MG tablet TAKE ONE TABLET BY  MOUTH DAILY AS NEEDED FOR ALLERGIES 90 tablet 3   losartan (COZAAR) 100 MG tablet Take 1 tablet (100 mg total) by mouth daily. 90 tablet 3   MULTIPLE VITAMIN PO Take 1 tablet by mouth daily.     nebivolol (BYSTOLIC) 2.5 MG tablet Take 1 tablet (2.5 mg total) by mouth daily. 90 tablet 3   lidocaine-prilocaine (EMLA) cream Apply to port site 30 mins prior using once 30 g 3   No current facility-administered medications for this visit.   Facility-Administered Medications Ordered in Other Visits  Medication Dose Route Frequency Provider Last Rate Last Admin   heparin lock flush 100 UNIT/ML injection            heparin lock flush 100 unit/mL  500 Units Intravenous Once Jane Canary, MD       sodium chloride flush (NS) 0.9 % injection 10 mL  10 mL Intravenous Once Jane Canary, MD        SUMMARY OF ONCOLOGIC HISTORY: Oncology History  Primary lung adenocarcinoma ([place])  10/10/2021 Initial Diagnosis   Primary lung adenocarcinoma ([place]) Stage IV  Patient seen by PCP for wheezing for 2 weeks. Imaging with CXR followed by CT chest showed Spiculated 2.1 x 2.2 cm left upper lobe pulmonary nodule with associated pleural tethering. Innumerable subcentimeter pulmonary nodules throughout the lungs.      11/14/2021 PET scan   IMPRESSION: 1. Left suprahilar nodule with maximum SUV 4.5, and a more distal 2.2 cm left upper lobe nodule with maximum SUV of 5.5. 2. Innumerable scattered small pulmonary nodules, most of which are under 5 mm in diameter, throughout both lungs. Some are cavitary. Possibilities include cavitating hematogenous disseminated malignancy versus infectious etiology subset as septic emboli. Most of the nodules are stable, some are minimally enlarged compared to 10/10/2021. 3. Abnormal mild permeative type bony findings in the right hemipelvis associated with substantially accentuated metabolic activity. Possible associated pathologic fracture through the right quadrilateral  plate and right acetabulum. The findings involve the right ilium and ischium but also the right lateral sacral ala and a small focus in the left sacrum.    Pathology Results   She had transbronchial biopsies by Dr. Patsey Berthold on 11/19/21.  Pathology from LUL nodule showed adenocarcinoma, consistent with lung primary.    11/19/2021 Cancer Staging   Staging form: Lung, AJCC 8th Edition - Clinical stage from 11/19/2021: Stage IVB (cT1c, cM1c) - Signed by Jane Canary, MD on 11/27/2021 Stage prefix: Initial diagnosis   11/27/2021 Imaging   MRI pelvis  IMPRESSION: 1. Abnormal marrow signal throughout the right ilium involving the right acetabulum, right superior pubic ramus and right inferior pubic ramus, right side of the sacrum and a smaller focus of abnormal marrow lesion in the left side of the sacrum. Findings are concerning for metastatic disease. 2. Nondisplaced pathologic fracture of the quadrilateral plate of the right acetabulum. 3. Mild muscle edema in the right gluteus minimus and medius muscles likely reflecting mild muscle strain.  MRI brain  IMPRESSION: No evidence of intracranial metastatic disease.   Indeterminate 1 cm left frontal calvarium lesion.  12/18/2021 -  Chemotherapy   Patient is on Treatment Plan : LUNG Carboplatin (5) + Pemetrexed (500) + Pembrolizumab (200) D1 q21d Induction x 4 cycles / Maintenance Pemetrexed (500) + Pembrolizumab (200) D1 q21d     Primary malignant neoplasm of left upper lobe of lung ([place])  06/04/2022 Initial Diagnosis   Primary malignant neoplasm of left upper lobe of lung ([place])   06/04/2022 Cancer Staging   Staging form: Lung, AJCC 8th Edition - Pathologic: Stage IVB (pT1c, pNX, cM1c) - Signed by Cammie Sickle, MD on 06/04/2022     PHYSICAL EXAMINATION: ECOG PERFORMANCE STATUS: 2 - Symptomatic, <50% confined to bed  Vitals:   07/16/22 1320  BP: (!) 146/89  Pulse: 80  Resp: 17  Temp: (!) 97.4 F (36.3 C)  SpO2: 100%     Filed Weights   07/16/22 1320  Weight: 149 lb (67.6 kg)     Physical Exam Constitutional:      Appearance: Normal appearance.  HENT:     Head: Normocephalic and atraumatic.  Cardiovascular:     Rate and Rhythm: Normal rate.  Pulmonary:     Effort: Pulmonary effort is normal.  Musculoskeletal:     Right lower leg: No edema.     Left lower leg: No edema.  Skin:    General: Skin is warm.  Neurological:     General: No focal deficit present.     Mental Status: She is oriented to person, place, and time.  Psychiatric:        Mood and Affect: Mood normal.     LABORATORY DATA:  I have reviewed the data as listed    Component Value Date/Time   NA 135 07/16/2022 1308   K 4.1 07/16/2022 1308   CL 109 07/16/2022 1308   CO2 22 07/16/2022 1308   GLUCOSE 98 07/16/2022 1308   BUN 34 (H) 07/16/2022 1308   CREATININE 1.62 (H) 07/16/2022 1308   CALCIUM 8.8 (L) 07/16/2022 1308   PROT 7.3 07/16/2022 1308   ALBUMIN 3.4 (L) 07/16/2022 1308   AST 28 07/16/2022 1308   ALT 18 07/16/2022 1308   ALKPHOS 219 (H) 07/16/2022 1308   BILITOT 0.4 07/16/2022 1308   GFRNONAA 34 (L) 07/16/2022 1308    No results found for: "SPEP", "UPEP"  Lab Results  Component Value Date   WBC 2.7 (L) 07/16/2022   NEUTROABS 1.5 (L) 07/16/2022   HGB 10.2 (L) 07/16/2022   HCT 30.7 (L) 07/16/2022   MCV 101.0 (H) 07/16/2022   PLT 180 07/16/2022      Chemistry      Component Value Date/Time   NA 135 07/16/2022 1308   K 4.1 07/16/2022 1308   CL 109 07/16/2022 1308   CO2 22 07/16/2022 1308   BUN 34 (H) 07/16/2022 1308   CREATININE 1.62 (H) 07/16/2022 1308      Component Value Date/Time   CALCIUM 8.8 (L) 07/16/2022 1308   ALKPHOS 219 (H) 07/16/2022 1308   AST 28 07/16/2022 1308   ALT 18 07/16/2022 1308   BILITOT 0.4 07/16/2022 1308       RADIOGRAPHIC STUDIES: I have personally reviewed the radiological images as listed and agreed with the findings in the report. No results found.

## 2022-07-16 NOTE — Progress Notes (Signed)
Pt needs a refill on emla cream.

## 2022-07-16 NOTE — Patient Instructions (Signed)
[facility]  Discharge Instructions: Thank you for choosing [facility] to provide your oncology and hematology care.  If you have a lab appointment with the [facility], please go directly to the [facility] and check in at the registration area.  Wear comfortable clothing and clothing appropriate for easy access to any Portacath or PICC line.   We strive to give you quality time with your provider. You may need to reschedule your appointment if you arrive late (15 or more minutes).  Arriving late affects you and other patients whose appointments are after yours.  Also, if you miss three or more appointments without notifying the office, you may be dismissed from the clinic at the provider's discretion.      For prescription refill requests, have your pharmacy contact our office and allow 72 hours for refills to be completed.    Today you received the following chemotherapy and/or immunotherapy agents: KEYTRUDA     To help prevent nausea and vomiting after your treatment, we encourage you to take your nausea medication as directed.  BELOW ARE SYMPTOMS THAT SHOULD BE REPORTED IMMEDIATELY: *FEVER GREATER THAN 100.4 F (38 C) OR HIGHER *CHILLS OR SWEATING *NAUSEA AND VOMITING THAT IS NOT CONTROLLED WITH YOUR NAUSEA MEDICATION *UNUSUAL SHORTNESS OF BREATH *UNUSUAL BRUISING OR BLEEDING *URINARY PROBLEMS (pain or burning when urinating, or frequent urination) *BOWEL PROBLEMS (unusual diarrhea, constipation, pain near the anus) TENDERNESS IN MOUTH AND THROAT WITH OR WITHOUT PRESENCE OF ULCERS (sore throat, sores in mouth, or a toothache) UNUSUAL RASH, SWELLING OR PAIN  UNUSUAL VAGINAL DISCHARGE OR ITCHING   Items with * indicate a potential emergency and should be followed up as soon as possible or go to the Emergency Department if any problems should occur.  Please show the CHEMOTHERAPY ALERT CARD or IMMUNOTHERAPY ALERT CARD at check-in to  the Emergency Department and triage nurse.  Should you have questions after your visit or need to cancel or reschedule your appointment, please contact [facility]  [phone] and follow the prompts.  Office hours are 8:00 a.m. to 4:30 p.m. Monday - Friday. Please note that voicemails left after 4:00 p.m. may not be returned until the following business day.  We are closed weekends and major holidays. You have access to a nurse at all times for urgent questions. Please call the main number to the clinic [phone] and follow the prompts.  For any non-urgent questions, you may also contact your provider using MyChart. We now offer e-Visits for anyone 48 and older to request care online for non-urgent symptoms. For details visit [email or web address].   Also download the MyChart app! Go to the app store, search "MyChart", open the app, select [facility], and log in with your MyChart username and password.

## 2022-07-16 NOTE — Progress Notes (Signed)
Per MD proceed with Keytruda today

## 2022-07-16 NOTE — Progress Notes (Signed)
Cr 1.62 ok to proceed with [place]

## 2022-07-17 LAB — THYROID PANEL WITH TSH
Free Thyroxine Index: 1.8 (ref 1.2–4.9)
T3 Uptake Ratio: 27 % (ref 24–39)
T4, Total: 6.7 ug/dL (ref 4.5–12.0)
TSH: 2.02 u[IU]/mL (ref 0.450–4.500)

## 2022-07-23 ENCOUNTER — Other Ambulatory Visit: Payer: Self-pay

## 2022-07-24 ENCOUNTER — Encounter: Payer: Self-pay | Admitting: Internal Medicine

## 2022-08-06 ENCOUNTER — Encounter: Payer: Self-pay | Admitting: Internal Medicine

## 2022-08-06 ENCOUNTER — Inpatient Hospital Stay: Payer: PPO | Attending: Internal Medicine

## 2022-08-06 ENCOUNTER — Inpatient Hospital Stay (HOSPITAL_BASED_OUTPATIENT_CLINIC_OR_DEPARTMENT_OTHER): Payer: PPO | Admitting: Internal Medicine

## 2022-08-06 ENCOUNTER — Inpatient Hospital Stay: Payer: PPO

## 2022-08-06 VITALS — BP 128/70 | HR 88 | Temp 97.0°F | Resp 17 | Wt 141.2 lb

## 2022-08-06 DIAGNOSIS — R7989 Other specified abnormal findings of blood chemistry: Secondary | ICD-10-CM | POA: Diagnosis not present

## 2022-08-06 DIAGNOSIS — C7800 Secondary malignant neoplasm of unspecified lung: Secondary | ICD-10-CM | POA: Diagnosis not present

## 2022-08-06 DIAGNOSIS — C3490 Malignant neoplasm of unspecified part of unspecified bronchus or lung: Secondary | ICD-10-CM

## 2022-08-06 DIAGNOSIS — N179 Acute kidney failure, unspecified: Secondary | ICD-10-CM | POA: Insufficient documentation

## 2022-08-06 DIAGNOSIS — Z5112 Encounter for antineoplastic immunotherapy: Secondary | ICD-10-CM | POA: Diagnosis not present

## 2022-08-06 DIAGNOSIS — D649 Anemia, unspecified: Secondary | ICD-10-CM | POA: Diagnosis not present

## 2022-08-06 DIAGNOSIS — R519 Headache, unspecified: Secondary | ICD-10-CM | POA: Diagnosis not present

## 2022-08-06 DIAGNOSIS — R609 Edema, unspecified: Secondary | ICD-10-CM | POA: Diagnosis not present

## 2022-08-06 DIAGNOSIS — C7951 Secondary malignant neoplasm of bone: Secondary | ICD-10-CM | POA: Insufficient documentation

## 2022-08-06 DIAGNOSIS — C3412 Malignant neoplasm of upper lobe, left bronchus or lung: Secondary | ICD-10-CM | POA: Insufficient documentation

## 2022-08-06 DIAGNOSIS — Z79899 Other long term (current) drug therapy: Secondary | ICD-10-CM | POA: Insufficient documentation

## 2022-08-06 DIAGNOSIS — R748 Abnormal levels of other serum enzymes: Secondary | ICD-10-CM | POA: Insufficient documentation

## 2022-08-06 LAB — CBC WITH DIFFERENTIAL/PLATELET
Abs Immature Granulocytes: 0.01 10*3/uL (ref 0.00–0.07)
Basophils Absolute: 0 10*3/uL (ref 0.0–0.1)
Basophils Relative: 1 %
Eosinophils Absolute: 0.1 10*3/uL (ref 0.0–0.5)
Eosinophils Relative: 2 %
HCT: 30.9 % — ABNORMAL LOW (ref 36.0–46.0)
Hemoglobin: 10.3 g/dL — ABNORMAL LOW (ref 12.0–15.0)
Immature Granulocytes: 0 %
Lymphocytes Relative: 26 %
Lymphs Abs: 0.9 10*3/uL (ref 0.7–4.0)
MCH: 33 pg (ref 26.0–34.0)
MCHC: 33.3 g/dL (ref 30.0–36.0)
MCV: 99 fL (ref 80.0–100.0)
Monocytes Absolute: 0.4 10*3/uL (ref 0.1–1.0)
Monocytes Relative: 12 %
Neutro Abs: 1.9 10*3/uL (ref 1.7–7.7)
Neutrophils Relative %: 59 %
Platelets: 218 10*3/uL (ref 150–400)
RBC: 3.12 MIL/uL — ABNORMAL LOW (ref 3.87–5.11)
RDW: 11.8 % (ref 11.5–15.5)
WBC: 3.3 10*3/uL — ABNORMAL LOW (ref 4.0–10.5)
nRBC: 0 % (ref 0.0–0.2)

## 2022-08-06 LAB — COMPREHENSIVE METABOLIC PANEL
ALT: 13 U/L (ref 0–44)
AST: 23 U/L (ref 15–41)
Albumin: 3.4 g/dL — ABNORMAL LOW (ref 3.5–5.0)
Alkaline Phosphatase: 244 U/L — ABNORMAL HIGH (ref 38–126)
Anion gap: 6 (ref 5–15)
BUN: 28 mg/dL — ABNORMAL HIGH (ref 8–23)
CO2: 21 mmol/L — ABNORMAL LOW (ref 22–32)
Calcium: 9.1 mg/dL (ref 8.9–10.3)
Chloride: 109 mmol/L (ref 98–111)
Creatinine, Ser: 1.55 mg/dL — ABNORMAL HIGH (ref 0.44–1.00)
GFR, Estimated: 36 mL/min — ABNORMAL LOW (ref >60)
Glucose, Bld: 95 mg/dL (ref 70–99)
Potassium: 4.1 mmol/L (ref 3.5–5.1)
Sodium: 136 mmol/L (ref 135–145)
Total Bilirubin: 0.4 mg/dL (ref 0.3–1.2)
Total Protein: 7.2 g/dL (ref 6.5–8.1)

## 2022-08-06 LAB — GAMMA GT: GGT: 31 U/L (ref 7–50)

## 2022-08-06 MED ORDER — SODIUM CHLORIDE 0.9% FLUSH
10.0000 mL | INTRAVENOUS | Status: DC | PRN
  Filled 2022-08-06: qty 10

## 2022-08-06 MED ORDER — SODIUM CHLORIDE 0.9 % IV SOLN
200.0000 mg | Freq: Once | INTRAVENOUS | Status: AC
  Administered 2022-08-06: 200 mg via INTRAVENOUS
  Filled 2022-08-06: qty 8

## 2022-08-06 MED ORDER — SODIUM CHLORIDE 0.9 % IV SOLN
Freq: Once | INTRAVENOUS | Status: AC
  Filled 2022-08-06: qty 250

## 2022-08-06 MED ORDER — HEPARIN SOD (PORK) LOCK FLUSH 100 UNIT/ML IV SOLN
500.0000 [IU] | Freq: Once | INTRAVENOUS | Status: AC | PRN
  Administered 2022-08-06: 500 [IU]
  Filled 2022-08-06: qty 5

## 2022-08-06 NOTE — Progress Notes (Signed)
[facility] OFFICE PROGRESS NOTE  Patient Care Team: Pcp, No as PCP - General Glory Buff, RN as Oncology Nurse Navigator Michaelyn Barter, MD as Consulting Physician (Oncology)  TREATMENT:  Right hip palliative RT 30 cGy completed 12/18/2021 Carboplatin, alimta and Keytruda completed 02/19/22. Started maintenance alimta, keytruda  ASSESSMENT & PLAN:  # Primary lung adenocarcinoma (HCC), Stage IV, PDL1 1% -Diagnosed 11/19/2021 after transbronchial biopsy of LUL nodule by Dr. Jayme Cloud.  Metastatic to pelvis, bilateral hips R>L and numerous pulmonary nodules. MRI brain showed no intracranial mets. 1 cm indeterminate left frontal calvarium lesion. Monitor.   -Sought second opinion with [facility]. Veronia Beets at [facility] who agreed with our treatment plan.    -Foundation 1 testing showed PD-L1 1%.  No other targetable mutations.  Report below  -discontinued maintenance Alimta on 04/23/2022 due to worsening renal function, hematologic toxicity requiring multiple blood transfusions and elevated LFTs despite dose reductions.   -Restaging from 06/17/22 -CT chest abdomen pelvis showed mildly improved to stable disease  -Labs reviewed and acceptable for treatment. Will proceed with cycle 12 of maintenance Keytruda.   # Isolated alkaline phosphatase elevation -Has chronic elevation.  It ranges between 150-190.  Her ALP was 193 when she had a most recent scan end of February and overall her disease was stable to improved.  Now her ALP is gradually trending up with most recent 244.  It is still less than 2 times the upper limit of normal.  Could be coming from liver versus bone versus from [facility].  I have added GGT to help differentiate between bone versus liver origin.  With next blood work, if her GGT is persistently going up I will repeat CT chest abdomen and pelvis little sooner than her planned date in end of May.  #AKI - likely secondary to Alimta. Cr stabilized.   # Normocytic anemia   -Likely secondary to chemotherapy and iron panel consistent with anemia of chronic disease. 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 Aggie Cosier on 12/18/2021 -Was seen by [facility].  No surgical intervention needed.  # Access - port  RTC in 3 weeks for MD visit, labs, Keytruda  No orders of the defined types were placed in this encounter.  [facility]- 12/06/2021   All questions were answered. The patient knows to call the clinic with any problems, questions or concerns. The total time spent in the appointment was 30 minutes encounter with patients including review of chart and various tests results, discussions about plan of care and coordination of care plan   Michaelyn Barter, MD 08/06/2022 1:49 PM  INTERVAL HISTORY: Please see below for problem oriented charting.  Patient following with [place] for treatment of stage IV lung adenocarcinoma.  Currently on Keytruda maintenance.  Patient seen today accompanied with daughter.  Tolerating treatments well.  Denies any new shortness of breath, chest pain, fever, chills, nausea vomiting, abdominal pain.  Has slight headache on the left side of the eye for the past 1 week.  Was thinking movements.  Denies any changes with vision or dizziness.  No new bone pain.  Her hip feels good.   REVIEW OF SYSTEMS:   Positive ROS as above.  Rest 10 points ROS negative   I have reviewed the past medical history, past surgical history, social history and family history with the patient and they are unchanged from previous note.  ALLERGIES:  is allergic to diclofenac, lisinopril, and sulfa  antibiotics.  MEDICATIONS:  Current Outpatient Medications  Medication Sig Dispense Refill   albuterol (VENTOLIN HFA) 108 (90 Base) MCG/ACT inhaler Inhale 2 puffs into the lungs every 6 (six) hours as needed for wheezing or shortness of breath. 18 g 0   Azelastine HCl 137 MCG/SPRAY SOLN Place 2 sprays into  both nostrils 2 (two) times daily.     Cholecalciferol (VITAMIN D-3 PO) Take by mouth.     fluticasone (FLONASE) 50 MCG/ACT nasal spray USE TWO SPRAYS IN EACH NOSTRIL ONCE DAILY prn 16 g 12   lidocaine-prilocaine (EMLA) cream Apply to port site 30 mins prior using once 30 g 3   loratadine (CLARITIN) 10 MG tablet TAKE ONE TABLET BY MOUTH DAILY AS NEEDED FOR ALLERGIES 90 tablet 3   losartan (COZAAR) 100 MG tablet Take 1 tablet (100 mg total) by mouth daily. 90 tablet 3   MULTIPLE VITAMIN PO Take 1 tablet by mouth daily.     nebivolol (BYSTOLIC) 2.5 MG tablet Take 1 tablet (2.5 mg total) by mouth daily. 90 tablet 3   No current facility-administered medications for this visit.   Facility-Administered Medications Ordered in Other Visits  Medication Dose Route Frequency Provider Last Rate Last Admin   heparin lock flush 100 UNIT/ML injection            heparin lock flush 100 unit/mL  500 Units Intravenous Once Michaelyn Barter, MD       sodium chloride flush (NS) 0.9 % injection 10 mL  10 mL Intravenous Once Michaelyn Barter, MD        SUMMARY OF ONCOLOGIC HISTORY: Oncology History  Primary lung adenocarcinoma  10/10/2021 Initial Diagnosis   Primary lung adenocarcinoma (HCC) Stage IV  Patient seen by PCP for wheezing for 2 weeks. Imaging with CXR followed by CT chest showed Spiculated 2.1 x 2.2 cm left upper lobe pulmonary nodule with associated pleural tethering. Innumerable subcentimeter pulmonary nodules throughout the lungs.      11/14/2021 PET scan   IMPRESSION: 1. Left suprahilar nodule with maximum SUV 4.5, and a more distal 2.2 cm left upper lobe nodule with maximum SUV of 5.5. 2. Innumerable scattered small pulmonary nodules, most of which are under 5 mm in diameter, throughout both lungs. Some are cavitary. Possibilities include cavitating hematogenous disseminated malignancy versus infectious etiology subset as septic emboli. Most of the nodules are stable, some are minimally  enlarged compared to 10/10/2021. 3. Abnormal mild permeative type bony findings in the right hemipelvis associated with substantially accentuated metabolic activity. Possible associated pathologic fracture through the right quadrilateral plate and right acetabulum. The findings involve the right ilium and ischium but also the right lateral sacral ala and a small focus in the left sacrum.    Pathology Results   She had transbronchial biopsies by Dr. Jayme Cloud on 11/19/21.  Pathology from LUL nodule showed adenocarcinoma, consistent with lung primary.    11/19/2021 Cancer Staging   Staging form: Lung, AJCC 8th Edition - Clinical stage from 11/19/2021: Stage IVB (cT1c, cM1c) - Signed by Michaelyn Barter, MD on 11/27/2021 Stage prefix: Initial diagnosis   11/27/2021 Imaging   MRI pelvis  IMPRESSION: 1. Abnormal marrow signal throughout the right ilium involving the right acetabulum, right superior pubic ramus and right inferior pubic ramus, right side of the sacrum and a smaller focus of abnormal marrow lesion in the left side of the sacrum. Findings are concerning for metastatic disease. 2. Nondisplaced pathologic fracture of the quadrilateral plate of the right acetabulum. 3. Mild muscle  edema in the right gluteus minimus and medius muscles likely reflecting mild muscle strain.  MRI brain  IMPRESSION: No evidence of intracranial metastatic disease.   Indeterminate 1 cm left frontal calvarium lesion.   12/18/2021 -  Chemotherapy   Patient is on Treatment Plan : LUNG Carboplatin (5) + Pemetrexed (500) + Pembrolizumab (200) D1 q21d Induction x 4 cycles / Maintenance Pemetrexed (500) + Pembrolizumab (200) D1 q21d     Primary malignant neoplasm of left upper lobe of lung  06/04/2022 Initial Diagnosis   Primary malignant neoplasm of left upper lobe of lung (HCC)   06/04/2022 Cancer Staging   Staging form: Lung, AJCC 8th Edition - Pathologic: Stage IVB (pT1c, pNX, cM1c) - Signed by Earna Coder, MD on 06/04/2022     PHYSICAL EXAMINATION: ECOG PERFORMANCE STATUS: 2 - Symptomatic, <50% confined to bed  Vitals:   08/06/22 1259  BP: 128/70  Pulse: 88  Resp: 17  Temp: (!) 97 F (36.1 C)  SpO2: 100%    Filed Weights   08/06/22 1259  Weight: 141 lb 3.2 oz (64 kg)     Physical Exam Constitutional:      Appearance: Normal appearance.  HENT:     Head: Normocephalic and atraumatic.  Cardiovascular:     Rate and Rhythm: Normal rate.  Pulmonary:     Effort: Pulmonary effort is normal.  Musculoskeletal:     Right lower leg: No edema.     Left lower leg: No edema.  Skin:    General: Skin is warm.  Neurological:     General: No focal deficit present.     Mental Status: She is oriented to person, place, and time.  Psychiatric:        Mood and Affect: Mood normal.     LABORATORY DATA:  I have reviewed the data as listed    Component Value Date/Time   NA 136 08/06/2022 1312   K 4.1 08/06/2022 1312   CL 109 08/06/2022 1312   CO2 21 (L) 08/06/2022 1312   GLUCOSE 95 08/06/2022 1312   BUN 28 (H) 08/06/2022 1312   CREATININE 1.55 (H) 08/06/2022 1312   CALCIUM 9.1 08/06/2022 1312   PROT 7.2 08/06/2022 1312   ALBUMIN 3.4 (L) 08/06/2022 1312   AST 23 08/06/2022 1312   ALT 13 08/06/2022 1312   ALKPHOS 244 (H) 08/06/2022 1312   BILITOT 0.4 08/06/2022 1312   GFRNONAA 36 (L) 08/06/2022 1312    No results found for: "SPEP", "UPEP"  Lab Results  Component Value Date   WBC 3.3 (L) 08/06/2022   NEUTROABS 1.9 08/06/2022   HGB 10.3 (L) 08/06/2022   HCT 30.9 (L) 08/06/2022   MCV 99.0 08/06/2022   PLT 218 08/06/2022      Chemistry      Component Value Date/Time   NA 136 08/06/2022 1312   K 4.1 08/06/2022 1312   CL 109 08/06/2022 1312   CO2 21 (L) 08/06/2022 1312   BUN 28 (H) 08/06/2022 1312   CREATININE 1.55 (H) 08/06/2022 1312      Component Value Date/Time   CALCIUM 9.1 08/06/2022 1312   ALKPHOS 244 (H) 08/06/2022 1312   AST 23 08/06/2022 1312    ALT 13 08/06/2022 1312   BILITOT 0.4 08/06/2022 1312       RADIOGRAPHIC STUDIES: I have personally reviewed the radiological images as listed and agreed with the findings in the report. No results found.

## 2022-08-06 NOTE — Progress Notes (Signed)
Nutrition Follow-up:  Patient with stage IV lung cancer.  Patient receiving keytruda  Met with patient during infusion.  Patient reports that appetite is good.  Usually eats 2 meals a day (has later breakfast).  Denies nutrition impact symptoms at this time.  Patient is including sources of protein in diet.      Medications: reviewed  Labs: reviewed  Anthropometrics:   Weight 141 lb 3.2 oz 4/16  141 lb 11.2 oz on 2/13 140 lb 1.6 oz on 1/23 141 lb on 12/12 141 lb on 11/21 143 lb on 10/31 149 lb on 10/10    NUTRITION DIAGNOSIS: Increased nutrient needs continues    INTERVENTION:  Continue to include foods high in protein and calories to maintain weight    MONITORING, EVALUATION, GOAL: weight trends, intake   NEXT VISIT: as needed  Gwyn Mehring B. Freida Busman, RD, LDN Registered Dietitian [phone]

## 2022-08-06 NOTE — Patient Instructions (Signed)
[facility]  Discharge Instructions: Thank you for choosing [facility] to provide your oncology and hematology care.  If you have a lab appointment with the Cancer Center, please go directly to the Cancer Center and check in at the registration area.  Wear comfortable clothing and clothing appropriate for easy access to any Portacath or PICC line.   We strive to give you quality time with your provider. You may need to reschedule your appointment if you arrive late (15 or more minutes).  Arriving late affects you and other patients whose appointments are after yours.  Also, if you miss three or more appointments without notifying the office, you may be dismissed from the clinic at the provider's discretion.      For prescription refill requests, have your pharmacy contact our office and allow 72 hours for refills to be completed.    Today you received the following chemotherapy and/or immunotherapy agents- Keytruda      To help prevent nausea and vomiting after your treatment, we encourage you to take your nausea medication as directed.  BELOW ARE SYMPTOMS THAT SHOULD BE REPORTED IMMEDIATELY: *FEVER GREATER THAN 100.4 F (38 C) OR HIGHER *CHILLS OR SWEATING *NAUSEA AND VOMITING THAT IS NOT CONTROLLED WITH YOUR NAUSEA MEDICATION *UNUSUAL SHORTNESS OF BREATH *UNUSUAL BRUISING OR BLEEDING *URINARY PROBLEMS (pain or burning when urinating, or frequent urination) *BOWEL PROBLEMS (unusual diarrhea, constipation, pain near the anus) TENDERNESS IN MOUTH AND THROAT WITH OR WITHOUT PRESENCE OF ULCERS (sore throat, sores in mouth, or a toothache) UNUSUAL RASH, SWELLING OR PAIN  UNUSUAL VAGINAL DISCHARGE OR ITCHING   Items with * indicate a potential emergency and should be followed up as soon as possible or go to the Emergency Department if any problems should occur.  Please show the CHEMOTHERAPY ALERT CARD or IMMUNOTHERAPY ALERT CARD at check-in to  the Emergency Department and triage nurse.  Should you have questions after your visit or need to cancel or reschedule your appointment, please contact [facility]  [phone] and follow the prompts.  Office hours are 8:00 a.m. to 4:30 p.m. Monday - Friday. Please note that voicemails left after 4:00 p.m. may not be returned until the following business day.  We are closed weekends and major holidays. You have access to a nurse at all times for urgent questions. Please call the main number to the clinic [phone] and follow the prompts.  For any non-urgent questions, you may also contact your provider using MyChart. We now offer e-Visits for anyone 18 and older to request care online for non-urgent symptoms. For details visit [email or web address].   Also download the MyChart app! Go to the app store, search "MyChart", open the app, select , and log in with your MyChart username and password.   

## 2022-08-08 LAB — THYROID PANEL WITH TSH
Free Thyroxine Index: 2.2 (ref 1.2–4.9)
T3 Uptake Ratio: 30 % (ref 24–39)
T4, Total: 7.2 ug/dL (ref 4.5–12.0)
TSH: 1.51 u[IU]/mL (ref 0.450–4.500)

## 2022-08-10 ENCOUNTER — Other Ambulatory Visit: Payer: Self-pay

## 2022-08-20 ENCOUNTER — Other Ambulatory Visit: Payer: Self-pay

## 2022-08-27 ENCOUNTER — Inpatient Hospital Stay: Payer: PPO

## 2022-08-27 ENCOUNTER — Inpatient Hospital Stay: Payer: PPO | Attending: Internal Medicine | Admitting: Internal Medicine

## 2022-08-27 VITALS — BP 139/76 | HR 84 | Temp 97.8°F | Wt 143.5 lb

## 2022-08-27 DIAGNOSIS — D6481 Anemia due to antineoplastic chemotherapy: Secondary | ICD-10-CM

## 2022-08-27 DIAGNOSIS — R748 Abnormal levels of other serum enzymes: Secondary | ICD-10-CM | POA: Diagnosis not present

## 2022-08-27 DIAGNOSIS — C3412 Malignant neoplasm of upper lobe, left bronchus or lung: Secondary | ICD-10-CM | POA: Diagnosis not present

## 2022-08-27 DIAGNOSIS — C3490 Malignant neoplasm of unspecified part of unspecified bronchus or lung: Secondary | ICD-10-CM

## 2022-08-27 DIAGNOSIS — Z79899 Other long term (current) drug therapy: Secondary | ICD-10-CM | POA: Diagnosis not present

## 2022-08-27 DIAGNOSIS — R519 Headache, unspecified: Secondary | ICD-10-CM | POA: Diagnosis not present

## 2022-08-27 DIAGNOSIS — D649 Anemia, unspecified: Secondary | ICD-10-CM | POA: Diagnosis not present

## 2022-08-27 DIAGNOSIS — C7951 Secondary malignant neoplasm of bone: Secondary | ICD-10-CM | POA: Diagnosis not present

## 2022-08-27 DIAGNOSIS — Z5112 Encounter for antineoplastic immunotherapy: Secondary | ICD-10-CM | POA: Diagnosis not present

## 2022-08-27 DIAGNOSIS — N179 Acute kidney failure, unspecified: Secondary | ICD-10-CM | POA: Insufficient documentation

## 2022-08-27 DIAGNOSIS — T451X5A Adverse effect of antineoplastic and immunosuppressive drugs, initial encounter: Secondary | ICD-10-CM

## 2022-08-27 LAB — CBC WITH DIFFERENTIAL/PLATELET
Abs Immature Granulocytes: 0.03 10*3/uL (ref 0.00–0.07)
Basophils Absolute: 0 10*3/uL (ref 0.0–0.1)
Basophils Relative: 1 %
Eosinophils Absolute: 0.1 10*3/uL (ref 0.0–0.5)
Eosinophils Relative: 2 %
HCT: 30.3 % — ABNORMAL LOW (ref 36.0–46.0)
Hemoglobin: 10.2 g/dL — ABNORMAL LOW (ref 12.0–15.0)
Immature Granulocytes: 1 %
Lymphocytes Relative: 31 %
Lymphs Abs: 0.9 10*3/uL (ref 0.7–4.0)
MCH: 32.7 pg (ref 26.0–34.0)
MCHC: 33.7 g/dL (ref 30.0–36.0)
MCV: 97.1 fL (ref 80.0–100.0)
Monocytes Absolute: 0.3 10*3/uL (ref 0.1–1.0)
Monocytes Relative: 11 %
Neutro Abs: 1.6 10*3/uL — ABNORMAL LOW (ref 1.7–7.7)
Neutrophils Relative %: 54 %
Platelets: 144 10*3/uL — ABNORMAL LOW (ref 150–400)
RBC: 3.12 MIL/uL — ABNORMAL LOW (ref 3.87–5.11)
RDW: 12.1 % (ref 11.5–15.5)
WBC: 3 10*3/uL — ABNORMAL LOW (ref 4.0–10.5)
nRBC: 0 % (ref 0.0–0.2)

## 2022-08-27 LAB — COMPREHENSIVE METABOLIC PANEL
ALT: 11 U/L (ref 0–44)
AST: 23 U/L (ref 15–41)
Albumin: 3.5 g/dL (ref 3.5–5.0)
Alkaline Phosphatase: 253 U/L — ABNORMAL HIGH (ref 38–126)
Anion gap: 8 (ref 5–15)
BUN: 34 mg/dL — ABNORMAL HIGH (ref 8–23)
CO2: 21 mmol/L — ABNORMAL LOW (ref 22–32)
Calcium: 9.1 mg/dL (ref 8.9–10.3)
Chloride: 108 mmol/L (ref 98–111)
Creatinine, Ser: 1.45 mg/dL — ABNORMAL HIGH (ref 0.44–1.00)
GFR, Estimated: 39 mL/min — ABNORMAL LOW (ref >60)
Glucose, Bld: 107 mg/dL — ABNORMAL HIGH (ref 70–99)
Potassium: 4.2 mmol/L (ref 3.5–5.1)
Sodium: 137 mmol/L (ref 135–145)
Total Bilirubin: 0.4 mg/dL (ref 0.3–1.2)
Total Protein: 6.8 g/dL (ref 6.5–8.1)

## 2022-08-27 MED ORDER — SODIUM CHLORIDE 0.9 % IV SOLN
200.0000 mg | Freq: Once | INTRAVENOUS | Status: AC
  Administered 2022-08-27: 200 mg via INTRAVENOUS
  Filled 2022-08-27: qty 8

## 2022-08-27 MED ORDER — SODIUM CHLORIDE 0.9 % IV SOLN
Freq: Once | INTRAVENOUS | Status: AC
  Filled 2022-08-27: qty 250

## 2022-08-27 MED ORDER — HEPARIN SOD (PORK) LOCK FLUSH 100 UNIT/ML IV SOLN
500.0000 [IU] | Freq: Once | INTRAVENOUS | Status: AC | PRN
  Administered 2022-08-27: 500 [IU]
  Filled 2022-08-27: qty 5

## 2022-08-27 NOTE — Patient Instructions (Signed)
[facility]  Discharge Instructions: Thank you for choosing [facility] to provide your oncology and hematology care.  If you have a lab appointment with the Cancer Center, please go directly to the Cancer Center and check in at the registration area.  Wear comfortable clothing and clothing appropriate for easy access to any Portacath or PICC line.   We strive to give you quality time with your provider. You may need to reschedule your appointment if you arrive late (15 or more minutes).  Arriving late affects you and other patients whose appointments are after yours.  Also, if you miss three or more appointments without notifying the office, you may be dismissed from the clinic at the provider's discretion.      For prescription refill requests, have your pharmacy contact our office and allow 72 hours for refills to be completed.    Today you received the following chemotherapy and/or immunotherapy agents- Keytruda      To help prevent nausea and vomiting after your treatment, we encourage you to take your nausea medication as directed.  BELOW ARE SYMPTOMS THAT SHOULD BE REPORTED IMMEDIATELY: *FEVER GREATER THAN 100.4 F (38 C) OR HIGHER *CHILLS OR SWEATING *NAUSEA AND VOMITING THAT IS NOT CONTROLLED WITH YOUR NAUSEA MEDICATION *UNUSUAL SHORTNESS OF BREATH *UNUSUAL BRUISING OR BLEEDING *URINARY PROBLEMS (pain or burning when urinating, or frequent urination) *BOWEL PROBLEMS (unusual diarrhea, constipation, pain near the anus) TENDERNESS IN MOUTH AND THROAT WITH OR WITHOUT PRESENCE OF ULCERS (sore throat, sores in mouth, or a toothache) UNUSUAL RASH, SWELLING OR PAIN  UNUSUAL VAGINAL DISCHARGE OR ITCHING   Items with * indicate a potential emergency and should be followed up as soon as possible or go to the Emergency Department if any problems should occur.  Please show the CHEMOTHERAPY ALERT CARD or IMMUNOTHERAPY ALERT CARD at check-in to  the Emergency Department and triage nurse.  Should you have questions after your visit or need to cancel or reschedule your appointment, please contact [facility]  [phone] and follow the prompts.  Office hours are 8:00 a.m. to 4:30 p.m. Monday - Friday. Please note that voicemails left after 4:00 p.m. may not be returned until the following business day.  We are closed weekends and major holidays. You have access to a nurse at all times for urgent questions. Please call the main number to the clinic [phone] and follow the prompts.  For any non-urgent questions, you may also contact your provider using MyChart. We now offer e-Visits for anyone 18 and older to request care online for non-urgent symptoms. For details visit [email or web address].   Also download the MyChart app! Go to the app store, search "MyChart", open the app, select [facility], and log in with your MyChart username and password.   

## 2022-08-27 NOTE — Progress Notes (Signed)
Pt. Has been having some coughing and drainage from allergies to the pollen. For the past 3 weeks the pt. Says she is having some discomfort and numbness in her left frontal lobe that spreads down to her left eye that can get painful at times.

## 2022-08-27 NOTE — Progress Notes (Signed)
[facility] OFFICE PROGRESS NOTE  Patient Care Team: Pcp, No as PCP - General Glory Buff, RN as Oncology Nurse Navigator Michaelyn Barter, MD as Consulting Physician (Oncology)  TREATMENT:  Right hip palliative RT 30 cGy completed 12/18/2021 Carboplatin, alimta and Keytruda completed 02/19/22. Started maintenance alimta, keytruda  ASSESSMENT & PLAN:  # Primary lung adenocarcinoma (HCC), Stage IV, PDL1 1% -Diagnosed 11/19/2021 after transbronchial biopsy of LUL nodule by Dr. Jayme Cloud.  Metastatic to pelvis, bilateral hips R>L and numerous pulmonary nodules. MRI brain showed no intracranial mets. 1 cm indeterminate left frontal calvarium lesion. Monitor.   -Sought second opinion with Dr. Veronia Beets at [facility] who agreed with our treatment plan.    -Foundation 1 testing showed PD-L1 1%.  No other targetable mutations.  Report below  -discontinued maintenance Alimta on 04/23/2022 due to worsening renal function, hematologic toxicity requiring multiple blood transfusions and elevated LFTs despite dose reductions.   -Restaging from 06/17/22 -CT chest abdomen pelvis showed mildly improved to stable disease  -Labs reviewed and acceptable for treatment. Will proceed with cycle 13 of maintenance Keytruda.   # Isolated alkaline phosphatase elevation -Has chronic elevation.  Baseline between 150-190.  ALP slowly uptrending most recent 253.  GGT normal.  Likely coming from bone.  Sometimes Rande Lawman can also cause elevation.  Discussed with the patient and the daughter about repeating CT chest abdomen pelvis without contrast to reassess bones.  Has known metastatic disease to bone.  # Left-sided headache -Ongoing for 3 weeks.  Started after she put extensions in her hair.  Intermittent. -Will obtain MRI brain with and without contrast with her history of lung cancer.  #AKI - likely secondary to Alimta. Cr stabilized.   # Normocytic anemia  -Likely secondary to chemotherapy and iron  panel consistent with anemia of chronic disease. 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 Aggie Cosier on 12/18/2021 -Was seen by [facility].  No surgical intervention needed.  # Access - port  RTC in 3 weeks for MD visit, labs, Keytruda  Orders Placed This Encounter  Procedures   CT CHEST ABDOMEN PELVIS WO CONTRAST    Standing Status:   Future    Standing Expiration Date:   08/27/2023    Order Specific Question:   Preferred imaging location?    Answer:   [facility]    Order Specific Question:   If indicated for the ordered procedure, I authorize the administration of oral contrast media per Radiology protocol    Answer:   Yes    Order Specific Question:   Does the patient have a contrast media/X-ray dye allergy?    Answer:   No   MR Brain W Wo Contrast    Standing Status:   Future    Standing Expiration Date:   08/27/2023    Order Specific Question:   If indicated for the ordered procedure, I authorize the administration of contrast media per Radiology protocol    Answer:   Yes    Order Specific Question:   What is the patient's sedation requirement?    Answer:   No Sedation    Order Specific Question:   Does the patient have a pacemaker or implanted devices?    Answer:   No    Order Specific Question:   Use SRS Protocol?    Answer:   Yes    Order Specific Question:   Preferred imaging location?    Answer:  [facility] (table limit - 550lbs)   Thyroid Panel With TSH    Standing Status:   Future    Standing Expiration Date:   10/08/2023   CBC with Differential    Standing Status:   Future    Standing Expiration Date:   10/08/2023   Comprehensive metabolic panel    Standing Status:   Future    Standing Expiration Date:   10/08/2023   Thyroid Panel With TSH    Standing Status:   Future    Standing Expiration Date:   10/29/2023   CBC with Differential    Standing Status:   Future    Standing Expiration Date:    10/29/2023   Comprehensive metabolic panel    Standing Status:   Future    Standing Expiration Date:   10/29/2023   [facility]- 12/06/2021   All questions were answered. The patient knows to call the clinic with any problems, questions or concerns. The total time spent in the appointment was 30 minutes encounter with patients including review of chart and various tests results, discussions about plan of care and coordination of care plan   Michaelyn Barter, MD 08/27/2022 1:34 PM  INTERVAL HISTORY: Please see below for problem oriented charting.  Patient following with [place] for treatment of stage IV lung adenocarcinoma.  Currently on Keytruda maintenance.  Patient seen today accompanied by daughter.  Reports coughing and nasal drainage for the past 3 weeks.  Has allergies to pollen.  Has been taking Claritin as needed.  Reports left-sided headache with specific point tenderness.  Ongoing for 3 weeks.  Intermittent.  Started after she placed extensions in her hair.  Thinks she could read too hard and has sclap tenderness.  Denies any vision changes, dizziness, balance issues.  Has stiffness in her hips which is chronic.  REVIEW OF SYSTEMS:   Positive ROS as above.  Rest 10 points ROS negative   I have reviewed the past medical history, past surgical history, social history and family history with the patient and they are unchanged from previous note.  ALLERGIES:  is allergic to diclofenac, lisinopril, and sulfa antibiotics.  MEDICATIONS:  Current Outpatient Medications  Medication Sig Dispense Refill   albuterol (VENTOLIN HFA) 108 (90 Base) MCG/ACT inhaler Inhale 2 puffs into the lungs every 6 (six) hours as needed for wheezing or shortness of breath. 18 g 0   Azelastine HCl 137 MCG/SPRAY SOLN Place 2 sprays into both nostrils 2 (two) times daily.     Cholecalciferol (VITAMIN D-3 PO) Take by mouth.     fluticasone (FLONASE) 50 MCG/ACT nasal spray USE TWO SPRAYS IN EACH NOSTRIL ONCE DAILY prn  16 g 12   lidocaine-prilocaine (EMLA) cream Apply to port site 30 mins prior using once 30 g 3   loratadine (CLARITIN) 10 MG tablet TAKE ONE TABLET BY MOUTH DAILY AS NEEDED FOR ALLERGIES 90 tablet 3   losartan (COZAAR) 100 MG tablet Take 1 tablet (100 mg total) by mouth daily. 90 tablet 3   MULTIPLE VITAMIN PO Take 1 tablet by mouth daily.     nebivolol (BYSTOLIC) 2.5 MG tablet Take 1 tablet (2.5 mg total) by mouth daily. 90 tablet 3   No current facility-administered medications for this visit.   Facility-Administered Medications Ordered in Other Visits  Medication Dose Route Frequency Provider Last Rate Last Admin   heparin lock flush 100 UNIT/ML injection            heparin lock flush 100 unit/mL  500 Units Intravenous Once Michaelyn Barter, MD       sodium chloride flush (NS) 0.9 % injection 10 mL  10 mL Intravenous Once Michaelyn Barter, MD        SUMMARY OF ONCOLOGIC HISTORY: Oncology History  Primary lung adenocarcinoma (HCC)  10/10/2021 Initial Diagnosis   Primary lung adenocarcinoma (HCC) Stage IV  Patient seen by PCP for wheezing for 2 weeks. Imaging with CXR followed by CT chest showed Spiculated 2.1 x 2.2 cm left upper lobe pulmonary nodule with associated pleural tethering. Innumerable subcentimeter pulmonary nodules throughout the lungs.      11/14/2021 PET scan   IMPRESSION: 1. Left suprahilar nodule with maximum SUV 4.5, and a more distal 2.2 cm left upper lobe nodule with maximum SUV of 5.5. 2. Innumerable scattered small pulmonary nodules, most of which are under 5 mm in diameter, throughout both lungs. Some are cavitary. Possibilities include cavitating hematogenous disseminated malignancy versus infectious etiology subset as septic emboli. Most of the nodules are stable, some are minimally enlarged compared to 10/10/2021. 3. Abnormal mild permeative type bony findings in the right hemipelvis associated with substantially accentuated metabolic activity. Possible  associated pathologic fracture through the right quadrilateral plate and right acetabulum. The findings involve the right ilium and ischium but also the right lateral sacral ala and a small focus in the left sacrum.    Pathology Results   She had transbronchial biopsies by Dr. Jayme Cloud on 11/19/21.  Pathology from LUL nodule showed adenocarcinoma, consistent with lung primary.    11/19/2021 Cancer Staging   Staging form: Lung, AJCC 8th Edition - Clinical stage from 11/19/2021: Stage IVB (cT1c, cM1c) - Signed by Michaelyn Barter, MD on 11/27/2021 Stage prefix: Initial diagnosis   11/27/2021 Imaging   MRI pelvis  IMPRESSION: 1. Abnormal marrow signal throughout the right ilium involving the right acetabulum, right superior pubic ramus and right inferior pubic ramus, right side of the sacrum and a smaller focus of abnormal marrow lesion in the left side of the sacrum. Findings are concerning for metastatic disease. 2. Nondisplaced pathologic fracture of the quadrilateral plate of the right acetabulum. 3. Mild muscle edema in the right gluteus minimus and medius muscles likely reflecting mild muscle strain.  MRI brain  IMPRESSION: No evidence of intracranial metastatic disease.   Indeterminate 1 cm left frontal calvarium lesion.   12/18/2021 -  Chemotherapy   Patient is on Treatment Plan : LUNG Carboplatin (5) + Pemetrexed (500) + Pembrolizumab (200) D1 q21d Induction x 4 cycles / Maintenance Pemetrexed (500) + Pembrolizumab (200) D1 q21d     Primary malignant neoplasm of left upper lobe of lung (HCC)  06/04/2022 Initial Diagnosis   Primary malignant neoplasm of left upper lobe of lung (HCC)   06/04/2022 Cancer Staging   Staging form: Lung, AJCC 8th Edition - Pathologic: Stage IVB (pT1c, pNX, cM1c) - Signed by Earna Coder, MD on 06/04/2022     PHYSICAL EXAMINATION: ECOG PERFORMANCE STATUS: 2 - Symptomatic, <50% confined to bed  Vitals:   08/27/22 1304  BP: 139/76  Pulse:  84  Temp: 97.8 F (36.6 C)  SpO2: 99%    Filed Weights   08/27/22 1304  Weight: 143 lb 8 oz (65.1 kg)     Physical Exam Constitutional:      Appearance: Normal appearance.  HENT:     Head: Normocephalic and atraumatic.  Cardiovascular:     Rate and Rhythm: Normal rate.  Pulmonary:     Effort: Pulmonary effort is normal.  Musculoskeletal:     Right lower leg: No edema.     Left lower leg: No edema.  Skin:    General: Skin is warm.  Neurological:     General: No focal deficit present.     Mental Status: She is oriented to person, place, and time.  Psychiatric:        Mood and Affect: Mood normal.     LABORATORY DATA:  I have reviewed the data as listed    Component Value Date/Time   NA 137 08/27/2022 1241   K 4.2 08/27/2022 1241   CL 108 08/27/2022 1241   CO2 21 (L) 08/27/2022 1241   GLUCOSE 107 (H) 08/27/2022 1241   BUN 34 (H) 08/27/2022 1241   CREATININE 1.45 (H) 08/27/2022 1241   CALCIUM 9.1 08/27/2022 1241   PROT 6.8 08/27/2022 1241   ALBUMIN 3.5 08/27/2022 1241   AST 23 08/27/2022 1241   ALT 11 08/27/2022 1241   ALKPHOS 253 (H) 08/27/2022 1241   BILITOT 0.4 08/27/2022 1241   GFRNONAA 39 (L) 08/27/2022 1241    No results found for: "SPEP", "UPEP"  Lab Results  Component Value Date   WBC 3.0 (L) 08/27/2022   NEUTROABS 1.6 (L) 08/27/2022   HGB 10.2 (L) 08/27/2022   HCT 30.3 (L) 08/27/2022   MCV 97.1 08/27/2022   PLT 144 (L) 08/27/2022      Chemistry      Component Value Date/Time   NA 137 08/27/2022 1241   K 4.2 08/27/2022 1241   CL 108 08/27/2022 1241   CO2 21 (L) 08/27/2022 1241   BUN 34 (H) 08/27/2022 1241   CREATININE 1.45 (H) 08/27/2022 1241      Component Value Date/Time   CALCIUM 9.1 08/27/2022 1241   ALKPHOS 253 (H) 08/27/2022 1241   AST 23 08/27/2022 1241   ALT 11 08/27/2022 1241   BILITOT 0.4 08/27/2022 1241       RADIOGRAPHIC STUDIES: I have personally reviewed the radiological images as listed and agreed with the  findings in the report. No results found.

## 2022-08-28 ENCOUNTER — Other Ambulatory Visit: Payer: Self-pay

## 2022-08-29 ENCOUNTER — Encounter: Payer: Self-pay | Admitting: Internal Medicine

## 2022-08-29 LAB — THYROID PANEL WITH TSH
Free Thyroxine Index: 2 (ref 1.2–4.9)
T3 Uptake Ratio: 28 % (ref 24–39)
T4, Total: 7 ug/dL (ref 4.5–12.0)
TSH: 1.69 u[IU]/mL (ref 0.450–4.500)

## 2022-09-01 ENCOUNTER — Other Ambulatory Visit: Payer: Self-pay

## 2022-09-03 ENCOUNTER — Encounter: Payer: Self-pay | Admitting: Internal Medicine

## 2022-09-10 ENCOUNTER — Ambulatory Visit
Admission: RE | Admit: 2022-09-10 | Discharge: 2022-09-10 | Disposition: A | Discharge status: Home / Self Care | Payer: PPO | Source: Ambulatory Visit | Attending: Internal Medicine | Admitting: Internal Medicine

## 2022-09-10 DIAGNOSIS — C3490 Malignant neoplasm of unspecified part of unspecified bronchus or lung: Secondary | ICD-10-CM | POA: Diagnosis not present

## 2022-09-10 MED ORDER — GADOBUTROL 1 MMOL/ML IV SOLN
5.0000 mL | Freq: Once | INTRAVENOUS | Status: AC | PRN
  Administered 2022-09-10: 5 mL via INTRAVENOUS

## 2022-09-11 ENCOUNTER — Other Ambulatory Visit: Payer: Self-pay

## 2022-09-17 ENCOUNTER — Inpatient Hospital Stay (HOSPITAL_BASED_OUTPATIENT_CLINIC_OR_DEPARTMENT_OTHER): Payer: PPO | Admitting: Internal Medicine

## 2022-09-17 ENCOUNTER — Encounter: Payer: Self-pay | Admitting: Internal Medicine

## 2022-09-17 ENCOUNTER — Ambulatory Visit
Admission: RE | Admit: 2022-09-17 | Discharge: 2022-09-17 | Disposition: A | Discharge status: Home / Self Care | Payer: PPO | Source: Ambulatory Visit | Attending: Radiation Oncology | Admitting: Radiation Oncology

## 2022-09-17 ENCOUNTER — Other Ambulatory Visit: Payer: Self-pay | Admitting: *Deleted

## 2022-09-17 ENCOUNTER — Inpatient Hospital Stay: Payer: PPO

## 2022-09-17 ENCOUNTER — Encounter: Payer: Self-pay | Admitting: Radiation Oncology

## 2022-09-17 VITALS — BP 122/69 | HR 91 | Temp 98.0°F | Wt 142.2 lb

## 2022-09-17 DIAGNOSIS — C3412 Malignant neoplasm of upper lobe, left bronchus or lung: Secondary | ICD-10-CM | POA: Insufficient documentation

## 2022-09-17 DIAGNOSIS — C3490 Malignant neoplasm of unspecified part of unspecified bronchus or lung: Secondary | ICD-10-CM

## 2022-09-17 DIAGNOSIS — R918 Other nonspecific abnormal finding of lung field: Secondary | ICD-10-CM | POA: Insufficient documentation

## 2022-09-17 DIAGNOSIS — Z510 Encounter for antineoplastic radiation therapy: Secondary | ICD-10-CM | POA: Insufficient documentation

## 2022-09-17 DIAGNOSIS — Z79899 Other long term (current) drug therapy: Secondary | ICD-10-CM | POA: Insufficient documentation

## 2022-09-17 DIAGNOSIS — R748 Abnormal levels of other serum enzymes: Secondary | ICD-10-CM | POA: Insufficient documentation

## 2022-09-17 DIAGNOSIS — R519 Headache, unspecified: Secondary | ICD-10-CM

## 2022-09-17 DIAGNOSIS — Z5181 Encounter for therapeutic drug level monitoring: Secondary | ICD-10-CM | POA: Diagnosis not present

## 2022-09-17 DIAGNOSIS — D649 Anemia, unspecified: Secondary | ICD-10-CM | POA: Diagnosis not present

## 2022-09-17 DIAGNOSIS — C7951 Secondary malignant neoplasm of bone: Secondary | ICD-10-CM | POA: Insufficient documentation

## 2022-09-17 DIAGNOSIS — C7931 Secondary malignant neoplasm of brain: Secondary | ICD-10-CM

## 2022-09-17 LAB — CBC WITH DIFFERENTIAL/PLATELET
Abs Immature Granulocytes: 0.01 10*3/uL (ref 0.00–0.07)
Basophils Absolute: 0 10*3/uL (ref 0.0–0.1)
Basophils Relative: 1 %
Eosinophils Absolute: 0.1 10*3/uL (ref 0.0–0.5)
Eosinophils Relative: 2 %
HCT: 29.8 % — ABNORMAL LOW (ref 36.0–46.0)
Hemoglobin: 9.9 g/dL — ABNORMAL LOW (ref 12.0–15.0)
Immature Granulocytes: 0 %
Lymphocytes Relative: 21 %
Lymphs Abs: 0.8 10*3/uL (ref 0.7–4.0)
MCH: 31.9 pg (ref 26.0–34.0)
MCHC: 33.2 g/dL (ref 30.0–36.0)
MCV: 96.1 fL (ref 80.0–100.0)
Monocytes Absolute: 0.3 10*3/uL (ref 0.1–1.0)
Monocytes Relative: 8 %
Neutro Abs: 2.8 10*3/uL (ref 1.7–7.7)
Neutrophils Relative %: 68 %
Platelets: 189 10*3/uL (ref 150–400)
RBC: 3.1 MIL/uL — ABNORMAL LOW (ref 3.87–5.11)
RDW: 11.9 % (ref 11.5–15.5)
WBC: 4.1 10*3/uL (ref 4.0–10.5)
nRBC: 0 % (ref 0.0–0.2)

## 2022-09-17 LAB — COMPREHENSIVE METABOLIC PANEL
ALT: 12 U/L (ref 0–44)
AST: 22 U/L (ref 15–41)
Albumin: 3.4 g/dL — ABNORMAL LOW (ref 3.5–5.0)
Alkaline Phosphatase: 265 U/L — ABNORMAL HIGH (ref 38–126)
Anion gap: 9 (ref 5–15)
BUN: 32 mg/dL — ABNORMAL HIGH (ref 8–23)
CO2: 21 mmol/L — ABNORMAL LOW (ref 22–32)
Calcium: 9.2 mg/dL (ref 8.9–10.3)
Chloride: 103 mmol/L (ref 98–111)
Creatinine, Ser: 1.41 mg/dL — ABNORMAL HIGH (ref 0.44–1.00)
GFR, Estimated: 40 mL/min — ABNORMAL LOW (ref >60)
Glucose, Bld: 106 mg/dL — ABNORMAL HIGH (ref 70–99)
Potassium: 3.6 mmol/L (ref 3.5–5.1)
Sodium: 133 mmol/L — ABNORMAL LOW (ref 135–145)
Total Bilirubin: 0.4 mg/dL (ref 0.3–1.2)
Total Protein: 7.4 g/dL (ref 6.5–8.1)

## 2022-09-17 MED ORDER — DEXAMETHASONE 4 MG PO TABS: 4.0000 mg | ORAL_TABLET | Freq: Every day | ORAL | 0 refills | Status: DC

## 2022-09-17 MED ORDER — DEXAMETHASONE 4 MG PO TABS: 4.0000 mg | ORAL_TABLET | Freq: Two times a day (BID) | ORAL | 0 refills | Status: DC

## 2022-09-17 NOTE — Progress Notes (Signed)
Radiation Oncology Follow up Note  Name: Victoria Foley   Date:   09/17/2022 MRN:  [id number] DOB: Apr 18, 1951    This 72 y.o. female presents to the clinic today for evaluation of brain metastasis in patient with known.  Stage IV adenocarcinoma lung  REFERRING PROVIDER: No ref. provider found  HPI: Patient is a 72 year old female well-known to our department having previously been treated.  To her right hip and pelvis back in 23 for metastatic adenocarcinoma the lung.  She had numerous pulmonary nodules.  She has been treated with maintenance Keytruda.  She is recently had some left-sided headaches recent MRI scan showed marked progression of previous described left frontal calvarial lesion with intracranial extension of tumor into the subjacent dura with leptomeningeal involvement.  She also has a 1 cm enhancing lesion on the posterior left temporal lobe.  She is having no change in visual fields no focal neurologic deficits is now referred to radiation oncology for consideration of palliative treatment.  COMPLICATIONS OF TREATMENT: none  FOLLOW UP COMPLIANCE: keeps appointments   PHYSICAL EXAM:  There were no vitals taken for this visit. Crude visual fields within normal range motor or sensory and DTR levels are equal and symmetric in upper and lower extremities.  Well-developed well-nourished patient in NAD. HEENT reveals PERLA, EOMI, discs not visualized.  Oral cavity is clear. No oral mucosal lesions are identified. Neck is clear without evidence of cervical or supraclavicular adenopathy. Lungs are clear to A&P. Cardiac examination is essentially unremarkable with regular rate and rhythm without murmur rub or thrill. Abdomen is benign with no organomegaly or masses noted. Motor sensory and DTR levels are equal and symmetric in the upper and lower extremities. Cranial nerves II through XII are grossly intact. Proprioception is intact. No peripheral adenopathy or edema is identified. No motor  or sensory levels are noted. Crude visual fields are within normal range.  RADIOLOGY RESULTS: MRI scans reviewed  PLAN: This electrical head with IMRT radiation therapy to the left frontal calvarial lesion.  Will plan on 30 Gray in 5 fractions risks and benefits of treatment clued possible hair loss fatigue skin reaction all were reviewed in detail with the patient and her family.  I have set her up for treatment planning tomorrow.  Will also start on 4 mg of Decadron once a day.  I would like to take this opportunity to thank you for allowing me to participate in the care of your patient.Carmina Miller, MD

## 2022-09-18 ENCOUNTER — Ambulatory Visit
Admission: RE | Admit: 2022-09-18 | Discharge: 2022-09-18 | Disposition: A | Discharge status: Home / Self Care | Payer: PPO | Source: Ambulatory Visit | Attending: Radiation Oncology | Admitting: Radiation Oncology

## 2022-09-18 ENCOUNTER — Telehealth: Payer: Self-pay

## 2022-09-18 DIAGNOSIS — Z510 Encounter for antineoplastic radiation therapy: Secondary | ICD-10-CM | POA: Diagnosis not present

## 2022-09-18 LAB — THYROID PANEL WITH TSH
Free Thyroxine Index: 2.3 (ref 1.2–4.9)
T3 Uptake Ratio: 28 % (ref 24–39)
T4, Total: 8.1 ug/dL (ref 4.5–12.0)
TSH: 1.9 u[IU]/mL (ref 0.450–4.500)

## 2022-09-18 NOTE — Telephone Encounter (Signed)
Called patient to let her know that she would be getting a call from [facility] about doing some Clinical Trials. Patient understood.

## 2022-09-19 DIAGNOSIS — Z510 Encounter for antineoplastic radiation therapy: Secondary | ICD-10-CM | POA: Diagnosis not present

## 2022-09-23 ENCOUNTER — Other Ambulatory Visit: Payer: Self-pay | Admitting: *Deleted

## 2022-09-23 ENCOUNTER — Encounter: Payer: Self-pay | Admitting: Internal Medicine

## 2022-09-23 ENCOUNTER — Ambulatory Visit
Admission: RE | Admit: 2022-09-23 | Discharge: 2022-09-23 | Disposition: A | Discharge status: Home / Self Care | Payer: PPO | Source: Ambulatory Visit | Attending: Radiation Oncology | Admitting: Radiation Oncology

## 2022-09-23 ENCOUNTER — Other Ambulatory Visit: Payer: Self-pay

## 2022-09-23 DIAGNOSIS — R918 Other nonspecific abnormal finding of lung field: Secondary | ICD-10-CM | POA: Insufficient documentation

## 2022-09-23 DIAGNOSIS — Z5181 Encounter for therapeutic drug level monitoring: Secondary | ICD-10-CM | POA: Diagnosis not present

## 2022-09-23 DIAGNOSIS — Z510 Encounter for antineoplastic radiation therapy: Secondary | ICD-10-CM | POA: Diagnosis present

## 2022-09-23 DIAGNOSIS — C3412 Malignant neoplasm of upper lobe, left bronchus or lung: Secondary | ICD-10-CM | POA: Diagnosis not present

## 2022-09-23 DIAGNOSIS — D649 Anemia, unspecified: Secondary | ICD-10-CM | POA: Diagnosis not present

## 2022-09-23 DIAGNOSIS — R748 Abnormal levels of other serum enzymes: Secondary | ICD-10-CM | POA: Diagnosis not present

## 2022-09-23 DIAGNOSIS — C7931 Secondary malignant neoplasm of brain: Secondary | ICD-10-CM | POA: Diagnosis not present

## 2022-09-23 DIAGNOSIS — Z79899 Other long term (current) drug therapy: Secondary | ICD-10-CM | POA: Diagnosis not present

## 2022-09-23 DIAGNOSIS — C7951 Secondary malignant neoplasm of bone: Secondary | ICD-10-CM | POA: Diagnosis not present

## 2022-09-23 LAB — RAD ONC ARIA SESSION SUMMARY
Course Elapsed Days: 0
Plan Fractions Treated to Date: 1
Plan Prescribed Dose Per Fraction: 6 Gy
Plan Total Fractions Prescribed: 5
Plan Total Prescribed Dose: 30 Gy
Reference Point Dosage Given to Date: 6 Gy
Reference Point Session Dosage Given: 6 Gy
Session Number: 1

## 2022-09-23 MED ORDER — TRAMADOL HCL 50 MG PO TABS: 50.0000 mg | ORAL_TABLET | Freq: Four times a day (QID) | ORAL | 0 refills | Status: DC | PRN

## 2022-09-23 NOTE — Progress Notes (Signed)
[facility] OFFICE PROGRESS NOTE  Patient Care Team: Pcp, No as PCP - General Glory Buff, RN as Oncology Nurse Navigator Michaelyn Barter, MD as Consulting Physician (Oncology)  TREATMENT:  Right hip palliative RT 30 cGy completed 12/18/2021 Carboplatin, alimta and Keytruda completed 02/19/22.  Discontinued maintenance alimta (04-23-22) due to worsening renal dysfunction, transaminitis and cytopenias Keytruda maintenance-discontinued on 09/17/2022 due to disease progression  ASSESSMENT & PLAN:  # Primary lung adenocarcinoma (HCC), Stage IV, PDL1 1% -Diagnosed 11/19/2021 after transbronchial biopsy of LUL nodule by Dr. Jayme Cloud.  Metastatic to pelvis, bilateral hips R>L and numerous pulmonary nodules. MRI brain showed no intracranial mets. 1 cm indeterminate left frontal calvarium lesion.  -Foundation 1 testing showed PD-L1 1%.  No other targetable mutations.  Report below  -discontinued maintenance Alimta on 04/23/2022 due to worsening renal function, hematologic toxicity requiring multiple blood transfusions and elevated LFTs despite dose reductions.   -Restaging CT chest abdomen pelvis without contrast (09/10/2022)- Showed interval disease progression.  Increased size of left apical lung mass from 2.1 x 1.6 cm to 4.3 cm.  There is associated adjacent alveolar opacities suggestive of multifocal extension.  Innumerable diffuse bilateral pulmonary nodules with marked interval progression.  New L3 lytic lesion with early loss of height concerning for pathologic fracture.  Mixed lytic and blastic changes in right iliac bone and sacrum with evidence of progression.  Findings were discussed with the patient and her 2 daughters.  Unfortunately, her cancer has rapidly progressed in the past 2 to 3 months.  Will discontinue Keytruda today.  I reached out to Dr. Veronia Beets at [facility] and he has clinical trial available for second line setting.  He will schedule appointment for coming Monday to  discuss further.  # Left-sided headache -Resolved.  MRI brain with and without contrast (09/10/2022) showed interval mild progression of left frontal calvarium lesion with extension of tumor to involve subjacent dura measuring 2.8 x 2 cm.  Suspect slight/early extension into superior left orbit as well as extension of bony edema to involve left sphenoid wing with probable slight involvement of adjacent dura in the region.  New 1 cm enhancing lesion along the posterior left temporal lobe along the falx.  -Reestablish with Dr. Aggie Cosier.  He plans for palliative RT to left frontal calvarial lesion this week.  30 cGy in 5 fractions.   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 Cr stabilized.   # Normocytic anemia  -Likely secondary to chemotherapy and iron panel consistent with anemia of chronic disease. 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 Aggie Cosier on 12/18/2021 -Was seen by [facility].  No surgical intervention needed.  # Access - port  RTC in 3 weeks for MD visit  No orders of the defined types were placed in this encounter.  [facility]- 12/06/2021   All questions were answered. The patient knows to call the clinic with any problems, questions or concerns. The total time spent in the appointment was 30 minutes encounter with patients including review of chart and various tests results, discussions about plan of care and coordination of care plan   Michaelyn Barter, MD 09/23/2022 1:11 PM  INTERVAL HISTORY: Please see below for problem oriented charting.  Patient following with [place] for treatment of stage IV lung adenocarcinoma.   Patient was seen today accompanied with 2 daughters.  She has been feeling well overall.  Reports her left-sided headache has resolved.  Denies any visual  changes.  No new concerns.  REVIEW OF SYSTEMS:   Positive ROS as above.  Rest 10 points ROS negative   I have reviewed the past medical history, past  surgical history, social history and family history with the patient and they are unchanged from previous note.  ALLERGIES:  is allergic to diclofenac, lisinopril, and sulfa antibiotics.  MEDICATIONS:  Current Outpatient Medications  Medication Sig Dispense Refill   albuterol (VENTOLIN HFA) 108 (90 Base) MCG/ACT inhaler Inhale 2 puffs into the lungs every 6 (six) hours as needed for wheezing or shortness of breath. 18 g 0   Azelastine HCl 137 MCG/SPRAY SOLN Place 2 sprays into both nostrils 2 (two) times daily.     Cholecalciferol (VITAMIN D-3 PO) Take by mouth.     dexamethasone (DECADRON) 4 MG tablet Take 1 tablet (4 mg total) by mouth daily. 20 tablet 0   fluticasone (FLONASE) 50 MCG/ACT nasal spray USE TWO SPRAYS IN EACH NOSTRIL ONCE DAILY prn 16 g 12   lidocaine-prilocaine (EMLA) cream Apply to port site 30 mins prior using once 30 g 3   loratadine (CLARITIN) 10 MG tablet TAKE ONE TABLET BY MOUTH DAILY AS NEEDED FOR ALLERGIES 90 tablet 3   losartan (COZAAR) 100 MG tablet Take 1 tablet (100 mg total) by mouth daily. 90 tablet 3   MULTIPLE VITAMIN PO Take 1 tablet by mouth daily.     nebivolol (BYSTOLIC) 2.5 MG tablet Take 1 tablet (2.5 mg total) by mouth daily. 90 tablet 3   traMADol (ULTRAM) 50 MG tablet Take 1 tablet (50 mg total) by mouth every 6 (six) hours as needed. 60 tablet 0   No current facility-administered medications for this visit.   Facility-Administered Medications Ordered in Other Visits  Medication Dose Route Frequency Provider Last Rate Last Admin   heparin lock flush 100 UNIT/ML injection            heparin lock flush 100 unit/mL  500 Units Intravenous Once Michaelyn Barter, MD       sodium chloride flush (NS) 0.9 % injection 10 mL  10 mL Intravenous Once Michaelyn Barter, MD        SUMMARY OF ONCOLOGIC HISTORY: Oncology History  Primary lung adenocarcinoma (HCC)  10/10/2021 Initial Diagnosis   Primary lung adenocarcinoma (HCC) Stage IV  Patient seen by PCP for  wheezing for 2 weeks. Imaging with CXR followed by CT chest showed Spiculated 2.1 x 2.2 cm left upper lobe pulmonary nodule with associated pleural tethering. Innumerable subcentimeter pulmonary nodules throughout the lungs.      11/14/2021 PET scan   IMPRESSION: 1. Left suprahilar nodule with maximum SUV 4.5, and a more distal 2.2 cm left upper lobe nodule with maximum SUV of 5.5. 2. Innumerable scattered small pulmonary nodules, most of which are under 5 mm in diameter, throughout both lungs. Some are cavitary. Possibilities include cavitating hematogenous disseminated malignancy versus infectious etiology subset as septic emboli. Most of the nodules are stable, some are minimally enlarged compared to 10/10/2021. 3. Abnormal mild permeative type bony findings in the right hemipelvis associated with substantially accentuated metabolic activity. Possible associated pathologic fracture through the right quadrilateral plate and right acetabulum. The findings involve the right ilium and ischium but also the right lateral sacral ala and a small focus in the left sacrum.    Pathology Results   She had transbronchial biopsies by Dr. Jayme Cloud on 11/19/21.  Pathology from LUL nodule showed adenocarcinoma, consistent with lung primary.    11/19/2021 Cancer  Staging   Staging form: Lung, AJCC 8th Edition - Clinical stage from 11/19/2021: Stage IVB (cT1c, cM1c) - Signed by Michaelyn Barter, MD on 11/27/2021 Stage prefix: Initial diagnosis   11/27/2021 Imaging   MRI pelvis  IMPRESSION: 1. Abnormal marrow signal throughout the right ilium involving the right acetabulum, right superior pubic ramus and right inferior pubic ramus, right side of the sacrum and a smaller focus of abnormal marrow lesion in the left side of the sacrum. Findings are concerning for metastatic disease. 2. Nondisplaced pathologic fracture of the quadrilateral plate of the right acetabulum. 3. Mild muscle edema in the right  gluteus minimus and medius muscles likely reflecting mild muscle strain.  MRI brain  IMPRESSION: No evidence of intracranial metastatic disease.   Indeterminate 1 cm left frontal calvarium lesion.   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  HENT:     Head: Normocephalic and atraumatic.  Cardiovascular:     Rate and Rhythm: Normal rate.  Pulmonary:     Effort: Pulmonary effort is normal.  Musculoskeletal:     Right lower leg: No edema.     Left lower leg: No edema.  Skin:    General: Skin is warm.  Neurological:     General: No focal deficit present.     Mental Status: She is oriented to person, place, and time.  Psychiatric:        Mood and Affect: Mood normal.     LABORATORY DATA:  I have reviewed the data as listed    Component Value Date/Time   NA 133 (L) 09/17/2022 1254   K 3.6 09/17/2022 1254   CL 103 09/17/2022 1254   CO2 21 (L) 09/17/2022 1254   GLUCOSE 106 (H) 09/17/2022 1254   BUN 32 (H) 09/17/2022 1254   CREATININE 1.41 (H) 09/17/2022 1254   CALCIUM 9.2 09/17/2022 1254   PROT 7.4 09/17/2022 1254   ALBUMIN 3.4 (L) 09/17/2022 1254   AST 22 09/17/2022 1254    ALT 12 09/17/2022 1254   ALKPHOS 265 (H) 09/17/2022 1254   BILITOT 0.4 09/17/2022 1254   GFRNONAA 40 (L) 09/17/2022 1254    No results found for: "SPEP", "UPEP"  Lab Results  Component Value Date   WBC 4.1 09/17/2022   NEUTROABS 2.8 09/17/2022   HGB 9.9 (L) 09/17/2022   HCT 29.8 (L) 09/17/2022   MCV 96.1 09/17/2022   PLT 189 09/17/2022      Chemistry      Component Value Date/Time   NA 133 (L) 09/17/2022 1254   K 3.6 09/17/2022 1254   CL  103 09/17/2022 1254   CO2 21 (L) 09/17/2022 1254   BUN 32 (H) 09/17/2022 1254   CREATININE 1.41 (H) 09/17/2022 1254      Component Value Date/Time   CALCIUM 9.2 09/17/2022 1254   ALKPHOS 265 (H) 09/17/2022 1254   AST 22 09/17/2022 1254   ALT 12 09/17/2022 1254   BILITOT 0.4 09/17/2022 1254       RADIOGRAPHIC STUDIES: I have personally reviewed the radiological images as listed and agreed with the findings in the report. MR Brain W Wo Contrast  Result Date: 09/17/2022 CLINICAL DATA:  Non-small cell lung cancer (NSCLC), monitor follow up lung cancer EXAM: MRI HEAD WITHOUT AND WITH CONTRAST TECHNIQUE: Multiplanar, multiecho pulse sequences of the brain and surrounding structures were obtained without and with intravenous contrast. CONTRAST:  5mL GADAVIST GADOBUTROL 1 MMOL/ML IV SOLN COMPARISON:  None Available. FINDINGS: Brain: Interval marked progression of the previously described left frontal calvarial lesion with intracranial extension of tumor to involve the subjacent dura, compatible with progressive osseous metastatic disease with leptomeningeal involvement. Tumor measures up to 2.8 x 2.0 cm on series 1019, image 116). Additionally, suspect slight/early extension into the superior left orbit (for example see series 15, image 42) as well as extension of bony tumor to involve the left sphenoid wing with probable slight involvement of the adjacent dura in this region as well. Abnormal marrow signal is contiguous between these 2 areas. New 1  cm enhancing lesion along the posterior left temporal lobe along the falx, compatible with an intraparenchymal metastasis (see series 1019, image 73). This may involve the adjacent falx. No evidence of acute infarct, acute hemorrhage, midline shift or hydrocephalus. Vascular: Major arterial flow voids are maintained at the skull base. Skull and upper cervical spine: Osseous metastatic disease is detailed above. Sinuses/Orbits: Intra orbital extension of tumor described above. IMPRESSION: 1. Interval marked progression of the previously described left frontal calvarial lesion with intracranial extension of tumor to involve the subjacent dura, compatible with progressive osseous metastatic disease with leptomeningeal involvement. Additionally, suspect slight/early extension into the superior left orbit as well as extension of bony tumor to involve the left sphenoid wing with probable slight involvement of the adjacent dura in this region as well. 2. New 1 cm enhancing lesion along the posterior left temporal lobe, compatible with an intraparenchymal metastasis. This may involve the adjacent falx. Electronically Signed   By: Feliberto Harts M.D.   On: 09/17/2022 11:59   CT CHEST ABDOMEN PELVIS WO CONTRAST  Result Date: 09/14/2022 CLINICAL DATA:  Non-small-cell lung cancer EXAM: CT CHEST, ABDOMEN AND PELVIS WITHOUT CONTRAST TECHNIQUE: Multidetector CT imaging of the chest, abdomen and pelvis was performed following the standard protocol without IV contrast. RADIATION DOSE REDUCTION: This exam was performed according to the departmental dose-optimization program which includes automated exposure control, adjustment of the mA and/or kV according to patient size and/or use of iterative reconstruction technique. COMPARISON:  06/14/2022 FINDINGS: CT CHEST FINDINGS Cardiovascular: No significant vascular findings. Normal heart size. No pericardial effusion. Mediastinum/Nodes: No enlarged mediastinal, hilar, or axillary  lymph nodes. Thyroid gland, trachea, and esophagus demonstrate no significant findings. Lungs/Pleura: Left apical lung mass significantly larger than on the previous examination now measuring to 4.3 cm maximum diameter. There is associated adjacent alveolar opacities suggestive of multifocal extension. There are innumerable diffuse bilateral pulmonary nodules in the varying sizes with marked interval progression. No pneumothorax or pleural effusion identified. Musculoskeletal: No chest wall mass or suspicious bone lesions identified. Right-sided Port-A-Cath tip is  in the SVC. CT ABDOMEN PELVIS FINDINGS Hepatobiliary: No hepatic parenchymal abnormalities. There are calcified gallstones. Pancreas: Unremarkable. No pancreatic ductal dilatation or surrounding inflammatory changes. Spleen: Normal in size without focal abnormality. Adrenals/Urinary Tract: Adrenal glands are unremarkable. 1.5 cm right kidney lesion consistent with a cyst which is stable finding. No nephrolithiasis or hydronephrosis. Urinary bladder is empty. Stomach/Bowel: Stomach is within normal limits. Appendix appears normal. No evidence of bowel wall thickening, distention, or inflammatory changes. Vascular/Lymphatic: No significant vascular findings are present. No enlarged abdominal or pelvic lymph nodes. Reproductive: Uterus and bilateral adnexa are unremarkable. Other: No abdominal wall hernia or abnormality. No abdominopelvic ascites. Musculoskeletal: Grade 1 L4 anterolisthesis. Sclerotic lesion at L4 appears stable. Lytic lesion at L3 appears to be new. There is an early compression deformity consistent with incipient compression fracture at the L3 level. Mixed lytic and blastic changes right iliac bone and sacrum. IMPRESSION: 1. Marked progression of disease with a increase in size left apical lung mass and extensive diffuse pulmonary nodules which have increased in size and number. 2. Multifocal osseous metastatic disease also with evidence of  progression. 3. L3 level osteolytic lesion with early loss of height concerning for an incipient compression fracture. Consider XRT consultation for prophylaxis. 4. Cholelithiasis. Electronically Signed   By: Layla Maw M.D.   On: 09/14/2022 13:22

## 2022-09-24 ENCOUNTER — Ambulatory Visit
Admission: RE | Admit: 2022-09-24 | Discharge: 2022-09-24 | Disposition: A | Discharge status: Home / Self Care | Payer: PPO | Source: Ambulatory Visit | Attending: Radiation Oncology | Admitting: Radiation Oncology

## 2022-09-24 ENCOUNTER — Inpatient Hospital Stay: Payer: PPO | Attending: Internal Medicine

## 2022-09-24 ENCOUNTER — Other Ambulatory Visit: Payer: Self-pay

## 2022-09-24 DIAGNOSIS — C7951 Secondary malignant neoplasm of bone: Secondary | ICD-10-CM | POA: Insufficient documentation

## 2022-09-24 DIAGNOSIS — Z79899 Other long term (current) drug therapy: Secondary | ICD-10-CM | POA: Insufficient documentation

## 2022-09-24 DIAGNOSIS — Z7952 Long term (current) use of systemic steroids: Secondary | ICD-10-CM | POA: Insufficient documentation

## 2022-09-24 DIAGNOSIS — Z5111 Encounter for antineoplastic chemotherapy: Secondary | ICD-10-CM | POA: Insufficient documentation

## 2022-09-24 DIAGNOSIS — T451X5A Adverse effect of antineoplastic and immunosuppressive drugs, initial encounter: Secondary | ICD-10-CM | POA: Insufficient documentation

## 2022-09-24 DIAGNOSIS — I129 Hypertensive chronic kidney disease with stage 1 through stage 4 chronic kidney disease, or unspecified chronic kidney disease: Secondary | ICD-10-CM | POA: Insufficient documentation

## 2022-09-24 DIAGNOSIS — M4316 Spondylolisthesis, lumbar region: Secondary | ICD-10-CM | POA: Insufficient documentation

## 2022-09-24 DIAGNOSIS — Z8616 Personal history of COVID-19: Secondary | ICD-10-CM | POA: Insufficient documentation

## 2022-09-24 DIAGNOSIS — R519 Headache, unspecified: Secondary | ICD-10-CM | POA: Insufficient documentation

## 2022-09-24 DIAGNOSIS — R202 Paresthesia of skin: Secondary | ICD-10-CM | POA: Insufficient documentation

## 2022-09-24 DIAGNOSIS — Z510 Encounter for antineoplastic radiation therapy: Secondary | ICD-10-CM | POA: Diagnosis not present

## 2022-09-24 DIAGNOSIS — R609 Edema, unspecified: Secondary | ICD-10-CM | POA: Insufficient documentation

## 2022-09-24 DIAGNOSIS — D6481 Anemia due to antineoplastic chemotherapy: Secondary | ICD-10-CM | POA: Insufficient documentation

## 2022-09-24 DIAGNOSIS — M533 Sacrococcygeal disorders, not elsewhere classified: Secondary | ICD-10-CM | POA: Insufficient documentation

## 2022-09-24 DIAGNOSIS — D631 Anemia in chronic kidney disease: Secondary | ICD-10-CM | POA: Insufficient documentation

## 2022-09-24 DIAGNOSIS — C3412 Malignant neoplasm of upper lobe, left bronchus or lung: Secondary | ICD-10-CM | POA: Insufficient documentation

## 2022-09-24 DIAGNOSIS — C7931 Secondary malignant neoplasm of brain: Secondary | ICD-10-CM | POA: Insufficient documentation

## 2022-09-24 DIAGNOSIS — N189 Chronic kidney disease, unspecified: Secondary | ICD-10-CM | POA: Insufficient documentation

## 2022-09-24 DIAGNOSIS — K8020 Calculus of gallbladder without cholecystitis without obstruction: Secondary | ICD-10-CM | POA: Insufficient documentation

## 2022-09-24 DIAGNOSIS — R200 Anesthesia of skin: Secondary | ICD-10-CM | POA: Insufficient documentation

## 2022-09-24 DIAGNOSIS — H538 Other visual disturbances: Secondary | ICD-10-CM | POA: Insufficient documentation

## 2022-09-24 LAB — RAD ONC ARIA SESSION SUMMARY
Course Elapsed Days: 1
Plan Fractions Treated to Date: 2
Plan Prescribed Dose Per Fraction: 6 Gy
Plan Total Fractions Prescribed: 5
Plan Total Prescribed Dose: 30 Gy
Reference Point Dosage Given to Date: 12 Gy
Reference Point Session Dosage Given: 6 Gy
Session Number: 2

## 2022-09-25 ENCOUNTER — Other Ambulatory Visit: Payer: Self-pay

## 2022-09-25 ENCOUNTER — Ambulatory Visit
Admission: RE | Admit: 2022-09-25 | Discharge: 2022-09-25 | Disposition: A | Discharge status: Home / Self Care | Payer: PPO | Source: Ambulatory Visit | Attending: Radiation Oncology | Admitting: Radiation Oncology

## 2022-09-25 DIAGNOSIS — Z510 Encounter for antineoplastic radiation therapy: Secondary | ICD-10-CM | POA: Diagnosis not present

## 2022-09-25 LAB — RAD ONC ARIA SESSION SUMMARY
Course Elapsed Days: 2
Plan Fractions Treated to Date: 3
Plan Prescribed Dose Per Fraction: 6 Gy
Plan Total Fractions Prescribed: 5
Plan Total Prescribed Dose: 30 Gy
Reference Point Dosage Given to Date: 18 Gy
Reference Point Session Dosage Given: 6 Gy
Session Number: 3

## 2022-09-26 ENCOUNTER — Other Ambulatory Visit: Payer: Self-pay

## 2022-09-26 ENCOUNTER — Ambulatory Visit
Admission: RE | Admit: 2022-09-26 | Discharge: 2022-09-26 | Disposition: A | Discharge status: Home / Self Care | Payer: PPO | Source: Ambulatory Visit | Attending: Radiation Oncology | Admitting: Radiation Oncology

## 2022-09-26 ENCOUNTER — Telehealth: Payer: Self-pay

## 2022-09-26 ENCOUNTER — Inpatient Hospital Stay: Payer: PPO

## 2022-09-26 DIAGNOSIS — Z510 Encounter for antineoplastic radiation therapy: Secondary | ICD-10-CM | POA: Diagnosis not present

## 2022-09-26 DIAGNOSIS — Z95828 Presence of other vascular implants and grafts: Secondary | ICD-10-CM

## 2022-09-26 LAB — RAD ONC ARIA SESSION SUMMARY
Course Elapsed Days: 3
Plan Fractions Treated to Date: 4
Plan Prescribed Dose Per Fraction: 6 Gy
Plan Total Fractions Prescribed: 5
Plan Total Prescribed Dose: 30 Gy
Reference Point Dosage Given to Date: 24 Gy
Reference Point Session Dosage Given: 6 Gy
Session Number: 4

## 2022-09-26 MED ORDER — HEPARIN SOD (PORK) LOCK FLUSH 100 UNIT/ML IV SOLN
500.0000 [IU] | Freq: Once | INTRAVENOUS | Status: AC
  Administered 2022-09-26: 500 [IU] via INTRAVENOUS
  Filled 2022-09-26: qty 5

## 2022-09-26 MED ORDER — SODIUM CHLORIDE 0.9% FLUSH
10.0000 mL | INTRAVENOUS | Status: DC | PRN
  Administered 2022-09-26: 10 mL via INTRAVENOUS
  Filled 2022-09-26: qty 10

## 2022-09-26 NOTE — Telephone Encounter (Signed)
Patient to have foundation one redrawn today while here for radiation. Per foundation one we do not need to fill out a new test req form, she said she would reactivate the current one.

## 2022-09-26 NOTE — Telephone Encounter (Signed)
-----   Message from Mardi Mainland, [place] sent at 09/26/2022  1:32 PM EDT ----- Regarding: FW: TESTING ON HOLD-UNLABELED TUBES- [facility]  ----- Message ----- From: Reggy Eye Sent: 09/26/2022  11:50 AM EDT To: Carlynn Herald, RN; Lesle Chris, RN; # Subject: TESTING ON HOLD-UNLABELED TUBES- [facility]  TESTING ON HOLD-UNLABELED TUBES- [facility] sent to her chart.

## 2022-09-26 NOTE — Telephone Encounter (Signed)
Pt has TOC in August with Dr. Clent Ridges

## 2022-09-26 NOTE — Patient Instructions (Signed)

## 2022-09-26 NOTE — Telephone Encounter (Signed)
Prescription Request  09/26/2022  LOV: Visit date not found  What is the name of the medication or equipment? nebivolol   Have you contacted your pharmacy to request a refill? Yes   Which pharmacy would you like this sent to?  [facility]  Patient notified that their request is being sent to the clinical staff for review and that they should receive a response within 2 business days.   Please advise at [facility] [phone]

## 2022-09-27 ENCOUNTER — Other Ambulatory Visit: Payer: Self-pay | Admitting: Internal Medicine

## 2022-09-27 ENCOUNTER — Ambulatory Visit
Admission: RE | Admit: 2022-09-27 | Discharge: 2022-09-27 | Disposition: A | Discharge status: Home / Self Care | Payer: PPO | Source: Ambulatory Visit | Attending: Radiation Oncology | Admitting: Radiation Oncology

## 2022-09-27 ENCOUNTER — Other Ambulatory Visit: Payer: Self-pay

## 2022-09-27 ENCOUNTER — Other Ambulatory Visit: Payer: Self-pay | Admitting: Family Medicine

## 2022-09-27 ENCOUNTER — Encounter: Payer: Self-pay | Admitting: Internal Medicine

## 2022-09-27 ENCOUNTER — Telehealth: Payer: Self-pay

## 2022-09-27 DIAGNOSIS — R000 Tachycardia, unspecified: Secondary | ICD-10-CM

## 2022-09-27 DIAGNOSIS — Z510 Encounter for antineoplastic radiation therapy: Secondary | ICD-10-CM | POA: Diagnosis not present

## 2022-09-27 DIAGNOSIS — I10 Essential (primary) hypertension: Secondary | ICD-10-CM

## 2022-09-27 DIAGNOSIS — C3490 Malignant neoplasm of unspecified part of unspecified bronchus or lung: Secondary | ICD-10-CM

## 2022-09-27 DIAGNOSIS — C3412 Malignant neoplasm of upper lobe, left bronchus or lung: Secondary | ICD-10-CM

## 2022-09-27 LAB — RAD ONC ARIA SESSION SUMMARY
Course Elapsed Days: 4
Plan Fractions Treated to Date: 5
Plan Prescribed Dose Per Fraction: 6 Gy
Plan Total Fractions Prescribed: 5
Plan Total Prescribed Dose: 30 Gy
Reference Point Dosage Given to Date: 30 Gy
Reference Point Session Dosage Given: 6 Gy
Session Number: 5

## 2022-09-27 MED ORDER — NEBIVOLOL HCL 2.5 MG PO TABS
2.5000 mg | ORAL_TABLET | Freq: Every day | ORAL | 0 refills | Status: DC
Start: 2022-09-27 — End: 2022-09-30

## 2022-09-27 NOTE — Telephone Encounter (Signed)
Medication sent, pt notified

## 2022-09-27 NOTE — Progress Notes (Signed)
DISCONTINUE OFF PATHWAY REGIMEN - Non-Small Cell Lung   [id number]:Pembrolizumab 200 mg  IV D1 + Pemetrexed 500 mg/m2 IV D1 + Carboplatin AUC=5 IV D1 q21 Days:   A cycle is every 21 days:     Pembrolizumab      Pemetrexed      Carboplatin   **Always confirm dose/schedule in your pharmacy ordering system**  REASON: Disease Progression PRIOR TREATMENT: Off Pathway: Pembrolizumab 200 mg  IV D1 + Pemetrexed 500 mg/m2 IV D1 + Carboplatin AUC=5 IV D1 q21 Days TREATMENT RESPONSE: Progressive Disease (PD)  START ON PATHWAY REGIMEN - Non-Small Cell Lung     A cycle is every 21 days:     Paclitaxel      Carboplatin   **Always confirm dose/schedule in your pharmacy ordering system**  Patient Characteristics: Stage IV Metastatic, Nonsquamous, Awaiting Molecular Test Results and Need to Start Chemotherapy, PS = 0, 1 Therapeutic Status: Stage IV Metastatic Histology: Nonsquamous Cell Broad Molecular Profiling Status: Awaiting Molecular Test Results and Need to Start Chemotherapy ECOG Performance Status: 1 Intent of Therapy: Non-Curative / Palliative Intent, Discussed with Patient

## 2022-09-27 NOTE — Telephone Encounter (Signed)
Faxed over the Lung Biopsy referral to [facility] over at Heart and Vascular Dept. On 09/27/2022 around 3:39 pm. (Had Cordelia Pen help me fill out the form and complete the electronic Add Order part in the patient's chart.

## 2022-09-28 ENCOUNTER — Other Ambulatory Visit: Payer: Self-pay

## 2022-09-29 ENCOUNTER — Encounter: Payer: Self-pay | Admitting: Family Medicine

## 2022-09-30 ENCOUNTER — Other Ambulatory Visit: Payer: Self-pay | Admitting: *Deleted

## 2022-09-30 ENCOUNTER — Encounter: Payer: Self-pay | Admitting: Internal Medicine

## 2022-09-30 ENCOUNTER — Telehealth: Payer: Self-pay

## 2022-09-30 DIAGNOSIS — I10 Essential (primary) hypertension: Secondary | ICD-10-CM

## 2022-09-30 DIAGNOSIS — C3412 Malignant neoplasm of upper lobe, left bronchus or lung: Secondary | ICD-10-CM

## 2022-09-30 DIAGNOSIS — R000 Tachycardia, unspecified: Secondary | ICD-10-CM

## 2022-09-30 MED ORDER — NEBIVOLOL HCL 2.5 MG PO TABS
2.5000 mg | ORAL_TABLET | Freq: Every day | ORAL | 0 refills | Status: DC
Start: 2022-09-30 — End: 2022-12-24

## 2022-09-30 NOTE — Addendum Note (Signed)
Addended by: [place], Ithan Touhey on: 09/30/2022 04:18 PM   Modules accepted: Orders

## 2022-09-30 NOTE — Telephone Encounter (Signed)
This message is being sent to the Doc of the day.  Patient is on vacation and accidentally threw out her nebivolol (BYSTOLIC) 2.5 MG tablet.  Patient is asking for a refill to be sent to: [facility] at [facility], [place]. Phone number [phone]

## 2022-09-30 NOTE — Addendum Note (Signed)
Addended byMichaelyn Barter on: 09/30/2022 01:10 PM   Modules accepted: Orders

## 2022-10-02 ENCOUNTER — Encounter: Payer: Self-pay | Admitting: Internal Medicine

## 2022-10-02 ENCOUNTER — Other Ambulatory Visit: Payer: Self-pay

## 2022-10-02 NOTE — Progress Notes (Signed)
Gilmer Mor, DO sent to [facility] for Image guided L3 lesion biopsy.  Would perform this in fluoro/IR.  Discussed with Oncology.  L3 is the only lytic lesion.  The rest are sclerotic.  Also, the patient is not symptomatic, and referring did not wish to have treated for pathologic fracture.  Just biopsy.  This can go to any spine physician doing biopsy, including the neurointerventional team.  Loreta Ave

## 2022-10-02 NOTE — Progress Notes (Signed)
Gilmer Mor, DO sent to [facility]: P Ir Procedure Requests Team, note that we can take this patient off the schedule for biopsy.  Thank you JW       Previous Messages    ----- Message ----- From: Victoria Barter, MD Sent: 10/02/2022   3:06 PM EDT To: Gilmer Mor, DO Subject: Regarding biopsy                              Hello Dr. Loreta Ave,  Regarding L3 biopsy- I discussed with the pathology, and the use of decalcifying agents can affect the NGS testing results.  So, we have decided to hold off on the biopsy right now.  Thank you  Candise Che

## 2022-10-03 ENCOUNTER — Inpatient Hospital Stay (HOSPITAL_BASED_OUTPATIENT_CLINIC_OR_DEPARTMENT_OTHER): Payer: PPO | Admitting: Internal Medicine

## 2022-10-03 DIAGNOSIS — R202 Paresthesia of skin: Secondary | ICD-10-CM | POA: Insufficient documentation

## 2022-10-03 DIAGNOSIS — Z7952 Long term (current) use of systemic steroids: Secondary | ICD-10-CM | POA: Insufficient documentation

## 2022-10-03 DIAGNOSIS — N189 Chronic kidney disease, unspecified: Secondary | ICD-10-CM | POA: Insufficient documentation

## 2022-10-03 DIAGNOSIS — R200 Anesthesia of skin: Secondary | ICD-10-CM | POA: Insufficient documentation

## 2022-10-03 DIAGNOSIS — C3412 Malignant neoplasm of upper lobe, left bronchus or lung: Secondary | ICD-10-CM | POA: Insufficient documentation

## 2022-10-03 DIAGNOSIS — Z8616 Personal history of COVID-19: Secondary | ICD-10-CM | POA: Diagnosis not present

## 2022-10-03 DIAGNOSIS — K8020 Calculus of gallbladder without cholecystitis without obstruction: Secondary | ICD-10-CM | POA: Diagnosis not present

## 2022-10-03 DIAGNOSIS — I129 Hypertensive chronic kidney disease with stage 1 through stage 4 chronic kidney disease, or unspecified chronic kidney disease: Secondary | ICD-10-CM | POA: Diagnosis not present

## 2022-10-03 DIAGNOSIS — M4316 Spondylolisthesis, lumbar region: Secondary | ICD-10-CM | POA: Insufficient documentation

## 2022-10-03 DIAGNOSIS — R609 Edema, unspecified: Secondary | ICD-10-CM | POA: Insufficient documentation

## 2022-10-03 DIAGNOSIS — C7931 Secondary malignant neoplasm of brain: Secondary | ICD-10-CM | POA: Insufficient documentation

## 2022-10-03 DIAGNOSIS — M533 Sacrococcygeal disorders, not elsewhere classified: Secondary | ICD-10-CM | POA: Insufficient documentation

## 2022-10-03 DIAGNOSIS — R519 Headache, unspecified: Secondary | ICD-10-CM | POA: Diagnosis not present

## 2022-10-03 DIAGNOSIS — C7951 Secondary malignant neoplasm of bone: Secondary | ICD-10-CM | POA: Insufficient documentation

## 2022-10-03 DIAGNOSIS — D631 Anemia in chronic kidney disease: Secondary | ICD-10-CM | POA: Diagnosis not present

## 2022-10-03 DIAGNOSIS — D6481 Anemia due to antineoplastic chemotherapy: Secondary | ICD-10-CM | POA: Diagnosis not present

## 2022-10-03 DIAGNOSIS — H538 Other visual disturbances: Secondary | ICD-10-CM | POA: Diagnosis not present

## 2022-10-03 DIAGNOSIS — Z79899 Other long term (current) drug therapy: Secondary | ICD-10-CM | POA: Diagnosis not present

## 2022-10-03 DIAGNOSIS — Z5111 Encounter for antineoplastic chemotherapy: Secondary | ICD-10-CM | POA: Insufficient documentation

## 2022-10-03 DIAGNOSIS — T451X5A Adverse effect of antineoplastic and immunosuppressive drugs, initial encounter: Secondary | ICD-10-CM | POA: Diagnosis not present

## 2022-10-03 DIAGNOSIS — C3490 Malignant neoplasm of unspecified part of unspecified bronchus or lung: Secondary | ICD-10-CM | POA: Diagnosis not present

## 2022-10-04 ENCOUNTER — Encounter: Payer: Self-pay | Admitting: Internal Medicine

## 2022-10-04 DIAGNOSIS — C7931 Secondary malignant neoplasm of brain: Secondary | ICD-10-CM | POA: Insufficient documentation

## 2022-10-04 MED FILL — Dexamethasone Sodium Phosphate Inj 100 MG/10ML: INTRAMUSCULAR | Qty: 1 | Status: AC

## 2022-10-04 MED FILL — Fosaprepitant Dimeglumine For IV Infusion 150 MG (Base Eq): INTRAVENOUS | Qty: 5 | Status: AC

## 2022-10-04 NOTE — Progress Notes (Signed)
[facility] OFFICE PROGRESS NOTE  I connected with Victoria Foley on 10/04/22 at  3:15 PM EDT by my chart video and verified that I am speaking with the correct person using two identifiers.   I discussed the limitations, risks, security and privacy concerns of performing an evaluation and management service by telemedicine and the availability of in-person appointments. I also discussed with the patient that there may be a patient responsible charge related to this service. The patient expressed understanding and agreed to proceed.   Other persons participating in the visit and their role in the encounter: Daughter  Patient's location: Home Provider's location: Clinic  Chief Complaint: Discuss treatment plan   Patient Care Team: Pcp, No as PCP - General Glory Buff, RN as Oncology Nurse Navigator Michaelyn Barter, MD as Consulting Physician (Oncology)  TREATMENT:  Right hip palliative RT 30 cGy completed 12/18/2021 Carboplatin, alimta and Keytruda completed 02/19/22.  Discontinued maintenance alimta (04-23-22) due to worsening renal dysfunction, transaminitis and cytopenias Keytruda maintenance-discontinued on 09/17/2022 due to disease progression  ASSESSMENT & PLAN:  # Primary lung adenocarcinoma (HCC), Stage IV, PDL1 1% -Diagnosed 11/19/2021 after transbronchial biopsy of LUL nodule by Dr. Jayme Cloud.  Metastatic to pelvis, bilateral hips R>L and numerous pulmonary nodules. MRI brain showed no intracranial mets. 1 cm indeterminate left frontal calvarium lesion.  -Foundation 1 testing showed PD-L1 1%.  No other targetable mutations.  Report below  -discontinued maintenance Alimta on 04/23/2022 due to worsening renal function, hematologic toxicity requiring multiple blood transfusions and elevated LFTs despite dose reductions.   -Restaging CT chest abdomen pelvis without contrast (09/10/2022)- Showed marked interval disease progression.  Increased size of left apical lung  mass from 2.1 x 1.6 cm to 4.3 cm.  There is associated adjacent alveolar opacities suggestive of multifocal extension.  Innumerable diffuse bilateral pulmonary nodules with marked interval progression.  New L3 lytic lesion with early loss of height concerning for pathologic fracture.  Mixed lytic and blastic changes in right iliac bone and sacrum with evidence of progression.    -She had televideo visit with Dr. Kemper Durie at [facility]  Does not have clinical trials available due to progressive brain mets.  Liquid foundation 1 testing sent to assess for any new mutations.  She was also advised tissue biopsy and to send for either foundation 1 or Caris.  L3 lesion can be biopsied however use of decalcifying agents can affect the NGS testing.  Alternative would be to have a lung biopsy which will provide most accurate NGS results.  However there is a risk of bleeding or pneumothorax.  I have discussed all these findings with the patient and her daughter.  At this time we will await liquid biopsy results and see if there is any mutation we can target.  In the meantime we will go ahead and start her on carboplatin AUC 4 and Taxol 150 mg/m every 3 weeks as advised by Dr. Kemper Durie.  20% dose reduction due to concern for myelotoxicity.  She will get Neulasta on pro.  Will closely monitor her anemia and may need to resume EPO injections.  Side effects were discussed in detail such as decreased blood count, need for blood transfusion, increased risk of infection, neuropathy, renal dysfunction, fatigue, decreased appetite, nausea, vomiting.  Plan to repeat CT chest abdomen pelvis after 3 cycles.  # Brain metastasis -Resolved.  MRI brain with and without contrast (09/10/2022) showed interval mild progression of left frontal calvarium lesion with extension of tumor to  involve subjacent dura measuring 2.8 x 2 cm.  Suspect slight/early extension into superior left orbit as well as extension of bony edema to involve left sphenoid wing  with probable slight involvement of adjacent dura in the region.  New 1 cm enhancing lesion along the posterior left temporal lobe along the falx.  -Reestablish with Dr. Aggie Cosier. Palliative RT to left frontal calvarial lesion this week.  30 cGy in 5 fractions completed on 09/27/2022.  On Decadron 4 mg daily for 3 weeks.  Plan to repeat MRI brain in 6 weeks.  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 Cr stabilized.   # Normocytic anemia  -Likely secondary to chemotherapy and iron panel consistent with anemia of chronic disease. 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 Aggie Cosier on 12/18/2021 -Was seen by [facility].  No surgical intervention needed.  # Access - port  RTC on Monday for MD visit, labs, cycle 1 of CarboTaxol.  No orders of the defined types were placed in this encounter.  [facility]- 12/06/2021   All questions were answered. The patient knows to call the clinic with any problems, questions or concerns. The total time spent in the appointment was 30 minutes encounter with patients including review of chart and various tests results, discussions about plan of care and coordination of care plan   Michaelyn Barter, MD 10/04/2022 1:13 PM  INTERVAL HISTORY: Please see below for problem oriented charting.  Patient following with [place] for treatment of stage IV lung adenocarcinoma.   I connected with the patient and her daughter via MyChart video visit. Her headache is resolved.  Denies any issues with breathing.  Has some stiffness Eston preferred in the left hip where she had prior surgery.  She is taking tramadol as needed which helps.  We also discussed about resuming physical therapy.  REVIEW OF SYSTEMS:   Positive ROS as above.  Rest 10 points ROS negative   I have reviewed the past medical history, past surgical history, social history and family history with the patient and they are unchanged from previous  note.  ALLERGIES:  is allergic to diclofenac, lisinopril, and sulfa antibiotics.  MEDICATIONS:  Current Outpatient Medications  Medication Sig Dispense Refill   traMADol (ULTRAM) 50 MG tablet Take 1 tablet (50 mg total) by mouth every 6 (six) hours as needed. 60 tablet 0   albuterol (VENTOLIN HFA) 108 (90 Base) MCG/ACT inhaler Inhale 2 puffs into the lungs every 6 (six) hours as needed for wheezing or shortness of breath. 18 g 0   Azelastine HCl 137 MCG/SPRAY SOLN Place 2 sprays into both nostrils 2 (two) times daily.     Cholecalciferol (VITAMIN D-3 PO) Take by mouth.     dexamethasone (DECADRON) 4 MG tablet Take 1 tablet (4 mg total) by mouth daily. 20 tablet 0   fluticasone (FLONASE) 50 MCG/ACT nasal spray USE TWO SPRAYS IN EACH NOSTRIL ONCE DAILY prn 16 g 12   loratadine (CLARITIN) 10 MG tablet TAKE ONE TABLET BY MOUTH DAILY AS NEEDED FOR ALLERGIES 90 tablet 3   losartan (COZAAR) 100 MG tablet Take 1 tablet (100 mg total) by mouth daily. 90 tablet 3   MULTIPLE VITAMIN PO Take 1 tablet by mouth daily.     nebivolol (BYSTOLIC) 2.5 MG tablet Take 1 tablet (2.5 mg total) by mouth daily. 90 tablet 0   No current facility-administered medications for this visit.   Facility-Administered Medications Ordered in Other  Visits  Medication Dose Route Frequency Provider Last Rate Last Admin   heparin lock flush 100 UNIT/ML injection            heparin lock flush 100 unit/mL  500 Units Intravenous Once Michaelyn Barter, MD       sodium chloride flush (NS) 0.9 % injection 10 mL  10 mL Intravenous Once Michaelyn Barter, MD        SUMMARY OF ONCOLOGIC HISTORY: Oncology History  Primary lung adenocarcinoma (HCC)  10/10/2021 Initial Diagnosis   Primary lung adenocarcinoma (HCC) Stage IV  Patient seen by PCP for wheezing for 2 weeks. Imaging with CXR followed by CT chest showed Spiculated 2.1 x 2.2 cm left upper lobe pulmonary nodule with associated pleural tethering. Innumerable subcentimeter pulmonary  nodules throughout the lungs.      11/14/2021 PET scan   IMPRESSION: 1. Left suprahilar nodule with maximum SUV 4.5, and a more distal 2.2 cm left upper lobe nodule with maximum SUV of 5.5. 2. Innumerable scattered small pulmonary nodules, most of which are under 5 mm in diameter, throughout both lungs. Some are cavitary. Possibilities include cavitating hematogenous disseminated malignancy versus infectious etiology subset as septic emboli. Most of the nodules are stable, some are minimally enlarged compared to 10/10/2021. 3. Abnormal mild permeative type bony findings in the right hemipelvis associated with substantially accentuated metabolic activity. Possible associated pathologic fracture through the right quadrilateral plate and right acetabulum. The findings involve the right ilium and ischium but also the right lateral sacral ala and a small focus in the left sacrum.    Pathology Results   She had transbronchial biopsies by Dr. Jayme Cloud on 11/19/21.  Pathology from LUL nodule showed adenocarcinoma, consistent with lung primary.    11/19/2021 Cancer Staging   Staging form: Lung, AJCC 8th Edition - Clinical stage from 11/19/2021: Stage IVB (cT1c, cM1c) - Signed by Michaelyn Barter, MD on 11/27/2021 Stage prefix: Initial diagnosis   11/27/2021 Imaging   MRI pelvis  IMPRESSION: 1. Abnormal marrow signal throughout the right ilium involving the right acetabulum, right superior pubic ramus and right inferior pubic ramus, right side of the sacrum and a smaller focus of abnormal marrow lesion in the left side of the sacrum. Findings are concerning for metastatic disease. 2. Nondisplaced pathologic fracture of the quadrilateral plate of the right acetabulum. 3. Mild muscle edema in the right gluteus minimus and medius muscles likely reflecting mild muscle strain.  MRI brain  IMPRESSION: No evidence of intracranial metastatic disease.   Indeterminate 1 cm left frontal calvarium  lesion.   12/18/2021 - 08/27/2022 Chemotherapy   Patient is on Treatment Plan : LUNG Carboplatin (5) + Pemetrexed (500) + Pembrolizumab (200) D1 q21d Induction x 4 cycles / Maintenance Pemetrexed (500) + Pembrolizumab (200) D1 q21d     10/07/2022 -  Chemotherapy   Patient is on Treatment Plan : LUNG Carboplatin + Paclitaxel q21d Dose Reduction     Primary malignant neoplasm of left upper lobe of lung (HCC)  06/04/2022 Initial Diagnosis   Primary malignant neoplasm of left upper lobe of lung (HCC)   06/04/2022 Cancer Staging   Staging form: Lung, AJCC 8th Edition - Pathologic: Stage IVB (pT1c, pNX, cM1c) - Signed by Earna Coder, MD on 06/04/2022     PHYSICAL EXAMINATION: ECOG PERFORMANCE STATUS: 2 - Symptomatic, <50% confined to bed  There were no vitals filed for this visit.   There were no vitals filed for this visit.    Physical Exam Constitutional:  Appearance: Normal appearance.  HENT:     Head: Normocephalic and atraumatic.  Cardiovascular:     Rate and Rhythm: Normal rate.  Pulmonary:     Effort: Pulmonary effort is normal.  Musculoskeletal:     Right lower leg: No edema.     Left lower leg: No edema.  Skin:    General: Skin is warm.  Neurological:     General: No focal deficit present.     Mental Status: She is oriented to person, place, and time.  Psychiatric:        Mood and Affect: Mood normal.     LABORATORY DATA:  I have reviewed the data as listed    Component Value Date/Time   NA 133 (L) 09/17/2022 1254   K 3.6 09/17/2022 1254   CL 103 09/17/2022 1254   CO2 21 (L) 09/17/2022 1254   GLUCOSE 106 (H) 09/17/2022 1254   BUN 32 (H) 09/17/2022 1254   CREATININE 1.41 (H) 09/17/2022 1254   CALCIUM 9.2 09/17/2022 1254   PROT 7.4 09/17/2022 1254   ALBUMIN 3.4 (L) 09/17/2022 1254   AST 22 09/17/2022 1254   ALT 12 09/17/2022 1254   ALKPHOS 265 (H) 09/17/2022 1254   BILITOT 0.4 09/17/2022 1254   GFRNONAA 40 (L) 09/17/2022 1254    No  results found for: "SPEP", "UPEP"  Lab Results  Component Value Date   WBC 4.1 09/17/2022   NEUTROABS 2.8 09/17/2022   HGB 9.9 (L) 09/17/2022   HCT 29.8 (L) 09/17/2022   MCV 96.1 09/17/2022   PLT 189 09/17/2022      Chemistry      Component Value Date/Time   NA 133 (L) 09/17/2022 1254   K 3.6 09/17/2022 1254   CL 103 09/17/2022 1254   CO2 21 (L) 09/17/2022 1254   BUN 32 (H) 09/17/2022 1254   CREATININE 1.41 (H) 09/17/2022 1254      Component Value Date/Time   CALCIUM 9.2 09/17/2022 1254   ALKPHOS 265 (H) 09/17/2022 1254   AST 22 09/17/2022 1254   ALT 12 09/17/2022 1254   BILITOT 0.4 09/17/2022 1254       RADIOGRAPHIC STUDIES: I have personally reviewed the radiological images as listed and agreed with the findings in the report. MR Brain W Wo Contrast  Result Date: 09/17/2022 CLINICAL DATA:  Non-small cell lung cancer (NSCLC), monitor follow up lung cancer EXAM: MRI HEAD WITHOUT AND WITH CONTRAST TECHNIQUE: Multiplanar, multiecho pulse sequences of the brain and surrounding structures were obtained without and with intravenous contrast. CONTRAST:  5mL GADAVIST GADOBUTROL 1 MMOL/ML IV SOLN COMPARISON:  None Available. FINDINGS: Brain: Interval marked progression of the previously described left frontal calvarial lesion with intracranial extension of tumor to involve the subjacent dura, compatible with progressive osseous metastatic disease with leptomeningeal involvement. Tumor measures up to 2.8 x 2.0 cm on series 1019, image 116). Additionally, suspect slight/early extension into the superior left orbit (for example see series 15, image 42) as well as extension of bony tumor to involve the left sphenoid wing with probable slight involvement of the adjacent dura in this region as well. Abnormal marrow signal is contiguous between these 2 areas. New 1 cm enhancing lesion along the posterior left temporal lobe along the falx, compatible with an intraparenchymal metastasis (see  series 1019, image 73). This may involve the adjacent falx. No evidence of acute infarct, acute hemorrhage, midline shift or hydrocephalus. Vascular: Major arterial flow voids are maintained at the skull base. Skull and upper  cervical spine: Osseous metastatic disease is detailed above. Sinuses/Orbits: Intra orbital extension of tumor described above. IMPRESSION: 1. Interval marked progression of the previously described left frontal calvarial lesion with intracranial extension of tumor to involve the subjacent dura, compatible with progressive osseous metastatic disease with leptomeningeal involvement. Additionally, suspect slight/early extension into the superior left orbit as well as extension of bony tumor to involve the left sphenoid wing with probable slight involvement of the adjacent dura in this region as well. 2. New 1 cm enhancing lesion along the posterior left temporal lobe, compatible with an intraparenchymal metastasis. This may involve the adjacent falx. Electronically Signed   By: Feliberto Harts M.D.   On: 09/17/2022 11:59   CT CHEST ABDOMEN PELVIS WO CONTRAST  Result Date: 09/14/2022 CLINICAL DATA:  Non-small-cell lung cancer EXAM: CT CHEST, ABDOMEN AND PELVIS WITHOUT CONTRAST TECHNIQUE: Multidetector CT imaging of the chest, abdomen and pelvis was performed following the standard protocol without IV contrast. RADIATION DOSE REDUCTION: This exam was performed according to the departmental dose-optimization program which includes automated exposure control, adjustment of the mA and/or kV according to patient size and/or use of iterative reconstruction technique. COMPARISON:  06/14/2022 FINDINGS: CT CHEST FINDINGS Cardiovascular: No significant vascular findings. Normal heart size. No pericardial effusion. Mediastinum/Nodes: No enlarged mediastinal, hilar, or axillary lymph nodes. Thyroid gland, trachea, and esophagus demonstrate no significant findings. Lungs/Pleura: Left apical lung mass  significantly larger than on the previous examination now measuring to 4.3 cm maximum diameter. There is associated adjacent alveolar opacities suggestive of multifocal extension. There are innumerable diffuse bilateral pulmonary nodules in the varying sizes with marked interval progression. No pneumothorax or pleural effusion identified. Musculoskeletal: No chest wall mass or suspicious bone lesions identified. Right-sided Port-A-Cath tip is in the SVC. CT ABDOMEN PELVIS FINDINGS Hepatobiliary: No hepatic parenchymal abnormalities. There are calcified gallstones. Pancreas: Unremarkable. No pancreatic ductal dilatation or surrounding inflammatory changes. Spleen: Normal in size without focal abnormality. Adrenals/Urinary Tract: Adrenal glands are unremarkable. 1.5 cm right kidney lesion consistent with a cyst which is stable finding. No nephrolithiasis or hydronephrosis. Urinary bladder is empty. Stomach/Bowel: Stomach is within normal limits. Appendix appears normal. No evidence of bowel wall thickening, distention, or inflammatory changes. Vascular/Lymphatic: No significant vascular findings are present. No enlarged abdominal or pelvic lymph nodes. Reproductive: Uterus and bilateral adnexa are unremarkable. Other: No abdominal wall hernia or abnormality. No abdominopelvic ascites. Musculoskeletal: Grade 1 L4 anterolisthesis. Sclerotic lesion at L4 appears stable. Lytic lesion at L3 appears to be new. There is an early compression deformity consistent with incipient compression fracture at the L3 level. Mixed lytic and blastic changes right iliac bone and sacrum. IMPRESSION: 1. Marked progression of disease with a increase in size left apical lung mass and extensive diffuse pulmonary nodules which have increased in size and number. 2. Multifocal osseous metastatic disease also with evidence of progression. 3. L3 level osteolytic lesion with early loss of height concerning for an incipient compression fracture.  Consider XRT consultation for prophylaxis. 4. Cholelithiasis. Electronically Signed   By: Layla Maw M.D.   On: 09/14/2022 13:22

## 2022-10-07 ENCOUNTER — Inpatient Hospital Stay: Payer: PPO

## 2022-10-07 ENCOUNTER — Inpatient Hospital Stay (HOSPITAL_BASED_OUTPATIENT_CLINIC_OR_DEPARTMENT_OTHER): Payer: PPO | Admitting: Internal Medicine

## 2022-10-07 ENCOUNTER — Encounter: Payer: Self-pay | Admitting: Internal Medicine

## 2022-10-07 VITALS — BP 138/83 | HR 100 | Temp 98.6°F | Wt 140.8 lb

## 2022-10-07 VITALS — BP 114/66 | HR 65 | Temp 97.8°F | Resp 16

## 2022-10-07 DIAGNOSIS — C3490 Malignant neoplasm of unspecified part of unspecified bronchus or lung: Secondary | ICD-10-CM | POA: Diagnosis not present

## 2022-10-07 DIAGNOSIS — Z5111 Encounter for antineoplastic chemotherapy: Secondary | ICD-10-CM | POA: Diagnosis not present

## 2022-10-07 DIAGNOSIS — C7931 Secondary malignant neoplasm of brain: Secondary | ICD-10-CM | POA: Diagnosis not present

## 2022-10-07 DIAGNOSIS — T451X5A Adverse effect of antineoplastic and immunosuppressive drugs, initial encounter: Secondary | ICD-10-CM

## 2022-10-07 DIAGNOSIS — D6481 Anemia due to antineoplastic chemotherapy: Secondary | ICD-10-CM | POA: Diagnosis not present

## 2022-10-07 LAB — CBC WITH DIFFERENTIAL (CANCER CENTER ONLY)
Abs Immature Granulocytes: 0.06 10*3/uL (ref 0.00–0.07)
Basophils Absolute: 0 10*3/uL (ref 0.0–0.1)
Basophils Relative: 0 %
Eosinophils Absolute: 0 10*3/uL (ref 0.0–0.5)
Eosinophils Relative: 0 %
HCT: 30.8 % — ABNORMAL LOW (ref 36.0–46.0)
Hemoglobin: 10.2 g/dL — ABNORMAL LOW (ref 12.0–15.0)
Immature Granulocytes: 1 %
Lymphocytes Relative: 11 %
Lymphs Abs: 0.7 10*3/uL (ref 0.7–4.0)
MCH: 32.1 pg (ref 26.0–34.0)
MCHC: 33.1 g/dL (ref 30.0–36.0)
MCV: 96.9 fL (ref 80.0–100.0)
Monocytes Absolute: 0.3 10*3/uL (ref 0.1–1.0)
Monocytes Relative: 4 %
Neutro Abs: 5.8 10*3/uL (ref 1.7–7.7)
Neutrophils Relative %: 84 %
Platelet Count: 164 10*3/uL (ref 150–400)
RBC: 3.18 MIL/uL — ABNORMAL LOW (ref 3.87–5.11)
RDW: 13.2 % (ref 11.5–15.5)
WBC Count: 6.9 10*3/uL (ref 4.0–10.5)
nRBC: 0 % (ref 0.0–0.2)

## 2022-10-07 LAB — CMP (CANCER CENTER ONLY)
ALT: 24 U/L (ref 0–44)
AST: 24 U/L (ref 15–41)
Albumin: 3.2 g/dL — ABNORMAL LOW (ref 3.5–5.0)
Alkaline Phosphatase: 342 U/L — ABNORMAL HIGH (ref 38–126)
Anion gap: 9 (ref 5–15)
BUN: 37 mg/dL — ABNORMAL HIGH (ref 8–23)
CO2: 20 mmol/L — ABNORMAL LOW (ref 22–32)
Calcium: 8.9 mg/dL (ref 8.9–10.3)
Chloride: 108 mmol/L (ref 98–111)
Creatinine: 1.56 mg/dL — ABNORMAL HIGH (ref 0.44–1.00)
GFR, Estimated: 36 mL/min — ABNORMAL LOW (ref >60)
Glucose, Bld: 117 mg/dL — ABNORMAL HIGH (ref 70–99)
Potassium: 4.2 mmol/L (ref 3.5–5.1)
Sodium: 137 mmol/L (ref 135–145)
Total Bilirubin: 0.7 mg/dL (ref 0.3–1.2)
Total Protein: 7.1 g/dL (ref 6.5–8.1)

## 2022-10-07 MED ORDER — HEPARIN SOD (PORK) LOCK FLUSH 100 UNIT/ML IV SOLN
500.0000 [IU] | Freq: Once | INTRAVENOUS | Status: AC | PRN
  Administered 2022-10-07: 500 [IU]
  Filled 2022-10-07: qty 5

## 2022-10-07 MED ORDER — SODIUM CHLORIDE 0.9 % IV SOLN
10.0000 mg | Freq: Once | INTRAVENOUS | Status: AC
  Administered 2022-10-07: 10 mg via INTRAVENOUS
  Filled 2022-10-07: qty 10

## 2022-10-07 MED ORDER — DIPHENHYDRAMINE HCL 50 MG/ML IJ SOLN
50.0000 mg | Freq: Once | INTRAMUSCULAR | Status: AC
  Administered 2022-10-07: 50 mg via INTRAVENOUS
  Filled 2022-10-07: qty 1

## 2022-10-07 MED ORDER — FAMOTIDINE IN NACL 20-0.9 MG/50ML-% IV SOLN
20.0000 mg | Freq: Once | INTRAVENOUS | Status: AC
  Administered 2022-10-07: 20 mg via INTRAVENOUS
  Filled 2022-10-07: qty 50

## 2022-10-07 MED ORDER — PEGFILGRASTIM 6 MG/0.6ML ~~LOC~~ PSKT
6.0000 mg | PREFILLED_SYRINGE | Freq: Once | SUBCUTANEOUS | Status: AC
  Administered 2022-10-07: 6 mg via SUBCUTANEOUS
  Filled 2022-10-07: qty 0.6

## 2022-10-07 MED ORDER — SODIUM CHLORIDE 0.9 % IV SOLN
236.8000 mg | Freq: Once | INTRAVENOUS | Status: AC
  Administered 2022-10-07: 240 mg via INTRAVENOUS
  Filled 2022-10-07: qty 24

## 2022-10-07 MED ORDER — SODIUM CHLORIDE 0.9 % IV SOLN
150.0000 mg/m2 | Freq: Once | INTRAVENOUS | Status: AC
  Administered 2022-10-07: 246 mg via INTRAVENOUS
  Filled 2022-10-07: qty 41

## 2022-10-07 MED ORDER — PALONOSETRON HCL INJECTION 0.25 MG/5ML
0.2500 mg | Freq: Once | INTRAVENOUS | Status: AC
  Administered 2022-10-07: 0.25 mg via INTRAVENOUS
  Filled 2022-10-07: qty 5

## 2022-10-07 MED ORDER — SODIUM CHLORIDE 0.9 % IV SOLN
150.0000 mg | Freq: Once | INTRAVENOUS | Status: AC
  Administered 2022-10-07: 150 mg via INTRAVENOUS
  Filled 2022-10-07: qty 150

## 2022-10-07 MED ORDER — SODIUM CHLORIDE 0.9 % IV SOLN
Freq: Once | INTRAVENOUS | Status: AC
  Filled 2022-10-07: qty 250

## 2022-10-07 MED ORDER — SODIUM CHLORIDE 0.9% FLUSH
10.0000 mL | INTRAVENOUS | Status: DC | PRN
  Administered 2022-10-07: 10 mL
  Filled 2022-10-07: qty 10

## 2022-10-07 NOTE — Patient Instructions (Signed)
Please take Decadron 4 mg once daily for 3 days after chemotherapy.   We will schedule MRI brain in next 2-3 weeks.   I will follow up with you in 3 weeks.

## 2022-10-07 NOTE — Patient Instructions (Signed)
[facility]  Discharge Instructions: Thank you for choosing [facility] to provide your oncology and hematology care.  If you have a lab appointment with the Cancer Center, please go directly to the Cancer Center and check in at the registration area.  Wear comfortable clothing and clothing appropriate for easy access to any Portacath or PICC line.   We strive to give you quality time with your provider. You may need to reschedule your appointment if you arrive late (15 or more minutes).  Arriving late affects you and other patients whose appointments are after yours.  Also, if you miss three or more appointments without notifying the office, you may be dismissed from the clinic at the provider's discretion.      For prescription refill requests, have your pharmacy contact our office and allow 72 hours for refills to be completed.    Today you received the following chemotherapy and/or immunotherapy agents taxol/carboplatin    To help prevent nausea and vomiting after your treatment, we encourage you to take your nausea medication as directed.  BELOW ARE SYMPTOMS THAT SHOULD BE REPORTED IMMEDIATELY: *FEVER GREATER THAN 100.4 F (38 C) OR HIGHER *CHILLS OR SWEATING *NAUSEA AND VOMITING THAT IS NOT CONTROLLED WITH YOUR NAUSEA MEDICATION *UNUSUAL SHORTNESS OF BREATH *UNUSUAL BRUISING OR BLEEDING *URINARY PROBLEMS (pain or burning when urinating, or frequent urination) *BOWEL PROBLEMS (unusual diarrhea, constipation, pain near the anus) TENDERNESS IN MOUTH AND THROAT WITH OR WITHOUT PRESENCE OF ULCERS (sore throat, sores in mouth, or a toothache) UNUSUAL RASH, SWELLING OR PAIN  UNUSUAL VAGINAL DISCHARGE OR ITCHING   Items with * indicate a potential emergency and should be followed up as soon as possible or go to the Emergency Department if any problems should occur.  Please show the CHEMOTHERAPY ALERT CARD or IMMUNOTHERAPY ALERT CARD at  check-in to the Emergency Department and triage nurse.  Should you have questions after your visit or need to cancel or reschedule your appointment, please contact [place] [facility]  [phone] and follow the prompts.  Office hours are 8:00 a.m. to 4:30 p.m. Monday - Friday. Please note that voicemails left after 4:00 p.m. may not be returned until the following business day.  We are closed weekends and major holidays. You have access to a nurse at all times for urgent questions. Please call the main number to the clinic [phone] and follow the prompts.  For any non-urgent questions, you may also contact your provider using MyChart. We now offer e-Visits for anyone 107 and older to request care online for non-urgent symptoms. For details visit [email or web address].   Also download the MyChart app! Go to the app store, search "MyChart", open the app, select , and log in with your MyChart username and password.

## 2022-10-07 NOTE — Progress Notes (Addendum)
[facility] OFFICE PROGRESS NOTE  Patient Care Team: Pcp, No as PCP - General Glory Buff, RN as Oncology Nurse Navigator Michaelyn Barter, MD as Consulting Physician (Oncology)  TREATMENT:  Right hip palliative RT 30 cGy completed 12/18/2021 Carboplatin, alimta and Keytruda x 4 cycles completed 02/19/22.  Discontinued maintenance alimta (04-23-22) due to worsening renal dysfunction, transaminitis and cytopenias Keytruda maintenance-discontinued on 09/17/2022 due to disease progression Palliative RT to left frontal lesion 5 fx completed 09/27/22 Carboplatin (AUC 4) and paclitaxel 150 mg/m2 - started 10/07/2022   ASSESSMENT & PLAN:  # Primary lung adenocarcinoma (HCC), Stage IV, PDL1 1% -Diagnosed 11/19/2021 after transbronchial biopsy of LUL nodule by Dr. Jayme Cloud.  Metastatic to pelvis, bilateral hips R>L and numerous pulmonary nodules. MRI brain showed no intracranial mets. 1 cm indeterminate left frontal calvarium lesion.  -Foundation 1 testing showed PD-L1 1%.  No other targetable mutations.  Report below  -discontinued maintenance Alimta on 04/23/2022 due to worsening renal function, hematologic toxicity requiring multiple blood transfusions and elevated LFTs despite dose reductions.   -Restaging CT chest abdomen pelvis without contrast (09/10/2022)- Showed interval disease progression.  Increased size of left apical lung mass from 2.1 x 1.6 cm to 4.3 cm.  There is associated adjacent alveolar opacities suggestive of multifocal extension.  Innumerable diffuse bilateral pulmonary nodules with marked interval progression.  New L3 lytic lesion with early loss of height concerning for pathologic fracture.  Mixed lytic and blastic changes in right iliac bone and sacrum with evidence of progression.    -Had televisit  with Dr. Kemper Durie at [facility].  There are no clinical trials available due to brain mets progression.  Appreciate recommendations.  Liquid foundation 1 testing sent to assess for  any mutation.  If there are no targets, plan is to refer to pulmonary for potential lung biopsy for tissue foundation 1 testing.  We decided to wait because lung biopsy does come with the risk of bleeding and pneumothorax and if we can find the target on liquid foundation 1 biopsy can be deferred.  L3 lesion is also a target, however because it is bone and involves use of decalcifying agent it can affect the NGS testing as discussed with pathology.  In the meantime, we will proceed with carboplatin AUC 4 and Taxol 150 mg/m considering the cancer has progressed quickly over the past 2 to 3 months.  Dose reduced due to myelotoxicity.  Side effects were rediscussed.  She will get Neulasta on pro.  Will use Decadron 4 mg for 3 days after chemotherapy.  She will reach out to [place] if she has any symptoms and we can help her schedule for IV fluid visit if needed.  Plan to repeat CT chest abdomen pelvis after 3 cycles to assess treatment response.  # Secondary metastasis to brain # Left-sided headache # Left blurry vision -Resolved.  MRI brain with and without contrast (09/10/2022) showed interval mild progression of left frontal calvarium lesion with extension of tumor to involve subjacent dura measuring 2.8 x 2 cm.  Suspect slight/early extension into superior left orbit as well as extension of bony edema to involve left sphenoid wing with probable slight involvement of adjacent dura in the region.  New 1 cm enhancing lesion along the posterior left temporal lobe along the falx.  -Completed palliative RT to left frontal lesion 5 fractions on 09/27/2022.  Reports some blurriness in the left eye when she is trying to read and not wearing glasses.  Also reports some blurriness  in right eye.  Will help set up follow-up visit with [facility].  Repeat MRI brain with and without contrast in 2 to 3 weeks.  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 Cr stabilized.  -I will continue to closely monitor.  #  Chemotherapy-induced anemia  -Will monitor while on chemotherapy.  If needed we will start her on Aranesp 300 mcg with every chemo cycle. -She has required PRBC transfusions in the past.  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 Aggie Cosier on 12/18/2021 -Was seen by [facility].  No surgical intervention needed.  # Access - port  RTC in 3 weeks for MD visit, labs, cycle 2 carboplatin and Taxol.  Orders Placed This Encounter  Procedures   MR Brain W Wo Contrast    Standing Status:   Future    Standing Expiration Date:   10/07/2023    Order Specific Question:   If indicated for the ordered procedure, I authorize the administration of contrast media per Radiology protocol    Answer:   Yes    Order Specific Question:   What is the patient's sedation requirement?    Answer:   No Sedation    Order Specific Question:   Does the patient have a pacemaker or implanted devices?    Answer:   No    Order Specific Question:   Use SRS Protocol?    Answer:   Yes    Order Specific Question:   Preferred imaging location?    Answer:   [facility] (table limit - 550lbs)   CBC with Differential (Cancer Center Only)    Standing Status:   Future    Standing Expiration Date:   10/28/2023   CMP (Cancer Center only)    Standing Status:   Future    Standing Expiration Date:   10/28/2023    [facility]- 12/06/2021   All questions were answered. The patient knows to call the clinic with any problems, questions or concerns. The total time spent in the appointment was 30 minutes encounter with patients including review of chart and various tests results, discussions about plan of care and coordination of care plan   Michaelyn Barter, MD 10/07/2022 4:30 PM  INTERVAL HISTORY: Please see below for problem oriented charting.  Patient following with [place] for treatment of stage IV lung adenocarcinoma.   Patient was seen today accompanied with daughter prior to  cycle 1 of [place] and Taxol. Left-sided headache is resolved.  But she reports blurriness in the left eye when she tries to read something.  Reports somewhat worse after radiation.  Distant vision is okay.  Tells me when she puts glasses she feels okay.  Also feels some blurriness in the right eye  REVIEW OF SYSTEMS:   Positive ROS as above.  Rest 10 points ROS negative   I have reviewed the past medical history, past surgical history, social history and family history with the patient and they are unchanged from previous note.  ALLERGIES:  is allergic to diclofenac, lisinopril, and sulfa antibiotics.  MEDICATIONS:  Current Outpatient Medications  Medication Sig Dispense Refill   albuterol (VENTOLIN HFA) 108 (90 Base) MCG/ACT inhaler Inhale 2 puffs into the lungs every 6 (six) hours as needed for wheezing or shortness of breath. 18 g 0   Azelastine HCl 137 MCG/SPRAY SOLN Place 2 sprays into both nostrils 2 (two) times daily.     Cholecalciferol (VITAMIN D-3 PO) Take by mouth.  dexamethasone (DECADRON) 4 MG tablet Take 1 tablet (4 mg total) by mouth daily. 20 tablet 0   fluticasone (FLONASE) 50 MCG/ACT nasal spray USE TWO SPRAYS IN EACH NOSTRIL ONCE DAILY prn 16 g 12   loratadine (CLARITIN) 10 MG tablet TAKE ONE TABLET BY MOUTH DAILY AS NEEDED FOR ALLERGIES 90 tablet 3   losartan (COZAAR) 100 MG tablet Take 1 tablet (100 mg total) by mouth daily. 90 tablet 3   MULTIPLE VITAMIN PO Take 1 tablet by mouth daily.     nebivolol (BYSTOLIC) 2.5 MG tablet Take 1 tablet (2.5 mg total) by mouth daily. 90 tablet 0   traMADol (ULTRAM) 50 MG tablet Take 1 tablet (50 mg total) by mouth every 6 (six) hours as needed. 60 tablet 0   No current facility-administered medications for this visit.   Facility-Administered Medications Ordered in Other Visits  Medication Dose Route Frequency Provider Last Rate Last Admin   heparin lock flush 100 UNIT/ML injection            heparin lock flush 100 unit/mL  500  Units Intravenous Once Michaelyn Barter, MD       sodium chloride flush (NS) 0.9 % injection 10 mL  10 mL Intravenous Once Michaelyn Barter, MD       sodium chloride flush (NS) 0.9 % injection 10 mL  10 mL Intracatheter PRN Michaelyn Barter, MD   10 mL at 10/07/22 1553    SUMMARY OF ONCOLOGIC HISTORY: Oncology History  Primary lung adenocarcinoma (HCC)  10/10/2021 Initial Diagnosis   Primary lung adenocarcinoma (HCC) Stage IV  Patient seen by PCP for wheezing for 2 weeks. Imaging with CXR followed by CT chest showed Spiculated 2.1 x 2.2 cm left upper lobe pulmonary nodule with associated pleural tethering. Innumerable subcentimeter pulmonary nodules throughout the lungs.      11/14/2021 PET scan   IMPRESSION: 1. Left suprahilar nodule with maximum SUV 4.5, and a more distal 2.2 cm left upper lobe nodule with maximum SUV of 5.5. 2. Innumerable scattered small pulmonary nodules, most of which are under 5 mm in diameter, throughout both lungs. Some are cavitary. Possibilities include cavitating hematogenous disseminated malignancy versus infectious etiology subset as septic emboli. Most of the nodules are stable, some are minimally enlarged compared to 10/10/2021. 3. Abnormal mild permeative type bony findings in the right hemipelvis associated with substantially accentuated metabolic activity. Possible associated pathologic fracture through the right quadrilateral plate and right acetabulum. The findings involve the right ilium and ischium but also the right lateral sacral ala and a small focus in the left sacrum.    Pathology Results   She had transbronchial biopsies by Dr. Jayme Cloud on 11/19/21.  Pathology from LUL nodule showed adenocarcinoma, consistent with lung primary.    11/19/2021 Cancer Staging   Staging form: Lung, AJCC 8th Edition - Clinical stage from 11/19/2021: Stage IVB (cT1c, cM1c) - Signed by Michaelyn Barter, MD on 11/27/2021 Stage prefix: Initial diagnosis   11/27/2021  Imaging   MRI pelvis  IMPRESSION: 1. Abnormal marrow signal throughout the right ilium involving the right acetabulum, right superior pubic ramus and right inferior pubic ramus, right side of the sacrum and a smaller focus of abnormal marrow lesion in the left side of the sacrum. Findings are concerning for metastatic disease. 2. Nondisplaced pathologic fracture of the quadrilateral plate of the right acetabulum. 3. Mild muscle edema in the right gluteus minimus and medius muscles likely reflecting mild muscle strain.  MRI brain  IMPRESSION: No evidence of  intracranial metastatic disease.   Indeterminate 1 cm left frontal calvarium lesion.   12/18/2021 - 08/27/2022 Chemotherapy   Patient is on Treatment Plan : LUNG Carboplatin (5) + Pemetrexed (500) + Pembrolizumab (200) D1 q21d Induction x 4 cycles / Maintenance Pemetrexed (500) + Pembrolizumab (200) D1 q21d     10/07/2022 -  Chemotherapy   Patient is on Treatment Plan : LUNG Carboplatin + Paclitaxel q21d Dose Reduction     Primary malignant neoplasm of left upper lobe of lung (HCC)  06/04/2022 Initial Diagnosis   Primary malignant neoplasm of left upper lobe of lung (HCC)   06/04/2022 Cancer Staging   Staging form: Lung, AJCC 8th Edition - Pathologic: Stage IVB (pT1c, pNX, cM1c) - Signed by Earna Coder, MD on 06/04/2022     PHYSICAL EXAMINATION: ECOG PERFORMANCE STATUS: 2 - Symptomatic, <50% confined to bed  Vitals:   10/07/22 0902  BP: 138/83  Pulse: 100  Temp: 98.6 F (37 C)  SpO2: 100%    Filed Weights   10/07/22 0902  Weight: 140 lb 12.8 oz (63.9 kg)     Physical Exam Constitutional:      Appearance: Normal appearance.  HENT:     Head: Normocephalic and atraumatic.  Cardiovascular:     Rate and Rhythm: Normal rate.  Pulmonary:     Effort: Pulmonary effort is normal.  Musculoskeletal:     Right lower leg: No edema.     Left lower leg: No edema.  Skin:    General: Skin is warm.  Neurological:      General: No focal deficit present.     Mental Status: She is oriented to person, place, and time.  Psychiatric:        Mood and Affect: Mood normal.     LABORATORY DATA:  I have reviewed the data as listed    Component Value Date/Time   NA 137 10/07/2022 0847   K 4.2 10/07/2022 0847   CL 108 10/07/2022 0847   CO2 20 (L) 10/07/2022 0847   GLUCOSE 117 (H) 10/07/2022 0847   BUN 37 (H) 10/07/2022 0847   CREATININE 1.56 (H) 10/07/2022 0847   CALCIUM 8.9 10/07/2022 0847   PROT 7.1 10/07/2022 0847   ALBUMIN 3.2 (L) 10/07/2022 0847   AST 24 10/07/2022 0847   ALT 24 10/07/2022 0847   ALKPHOS 342 (H) 10/07/2022 0847   BILITOT 0.7 10/07/2022 0847   GFRNONAA 36 (L) 10/07/2022 0847    No results found for: "SPEP", "UPEP"  Lab Results  Component Value Date   WBC 6.9 10/07/2022   NEUTROABS 5.8 10/07/2022   HGB 10.2 (L) 10/07/2022   HCT 30.8 (L) 10/07/2022   MCV 96.9 10/07/2022   PLT 164 10/07/2022      Chemistry      Component Value Date/Time   NA 137 10/07/2022 0847   K 4.2 10/07/2022 0847   CL 108 10/07/2022 0847   CO2 20 (L) 10/07/2022 0847   BUN 37 (H) 10/07/2022 0847   CREATININE 1.56 (H) 10/07/2022 0847      Component Value Date/Time   CALCIUM 8.9 10/07/2022 0847   ALKPHOS 342 (H) 10/07/2022 0847   AST 24 10/07/2022 0847   ALT 24 10/07/2022 0847   BILITOT 0.7 10/07/2022 0847       RADIOGRAPHIC STUDIES: I have personally reviewed the radiological images as listed and agreed with the findings in the report. MR Brain W Wo Contrast  Result Date: 09/17/2022 CLINICAL DATA:  Non-small cell lung cancer (  NSCLC), monitor follow up lung cancer EXAM: MRI HEAD WITHOUT AND WITH CONTRAST TECHNIQUE: Multiplanar, multiecho pulse sequences of the brain and surrounding structures were obtained without and with intravenous contrast. CONTRAST:  5mL GADAVIST GADOBUTROL 1 MMOL/ML IV SOLN COMPARISON:  None Available. FINDINGS: Brain: Interval marked progression of the previously  described left frontal calvarial lesion with intracranial extension of tumor to involve the subjacent dura, compatible with progressive osseous metastatic disease with leptomeningeal involvement. Tumor measures up to 2.8 x 2.0 cm on series 1019, image 116). Additionally, suspect slight/early extension into the superior left orbit (for example see series 15, image 42) as well as extension of bony tumor to involve the left sphenoid wing with probable slight involvement of the adjacent dura in this region as well. Abnormal marrow signal is contiguous between these 2 areas. New 1 cm enhancing lesion along the posterior left temporal lobe along the falx, compatible with an intraparenchymal metastasis (see series 1019, image 73). This may involve the adjacent falx. No evidence of acute infarct, acute hemorrhage, midline shift or hydrocephalus. Vascular: Major arterial flow voids are maintained at the skull base. Skull and upper cervical spine: Osseous metastatic disease is detailed above. Sinuses/Orbits: Intra orbital extension of tumor described above. IMPRESSION: 1. Interval marked progression of the previously described left frontal calvarial lesion with intracranial extension of tumor to involve the subjacent dura, compatible with progressive osseous metastatic disease with leptomeningeal involvement. Additionally, suspect slight/early extension into the superior left orbit as well as extension of bony tumor to involve the left sphenoid wing with probable slight involvement of the adjacent dura in this region as well. 2. New 1 cm enhancing lesion along the posterior left temporal lobe, compatible with an intraparenchymal metastasis. This may involve the adjacent falx. Electronically Signed   By: Feliberto Harts M.D.   On: 09/17/2022 11:59   CT CHEST ABDOMEN PELVIS WO CONTRAST  Result Date: 09/14/2022 CLINICAL DATA:  Non-small-cell lung cancer EXAM: CT CHEST, ABDOMEN AND PELVIS WITHOUT CONTRAST TECHNIQUE:  Multidetector CT imaging of the chest, abdomen and pelvis was performed following the standard protocol without IV contrast. RADIATION DOSE REDUCTION: This exam was performed according to the departmental dose-optimization program which includes automated exposure control, adjustment of the mA and/or kV according to patient size and/or use of iterative reconstruction technique. COMPARISON:  06/14/2022 FINDINGS: CT CHEST FINDINGS Cardiovascular: No significant vascular findings. Normal heart size. No pericardial effusion. Mediastinum/Nodes: No enlarged mediastinal, hilar, or axillary lymph nodes. Thyroid gland, trachea, and esophagus demonstrate no significant findings. Lungs/Pleura: Left apical lung mass significantly larger than on the previous examination now measuring to 4.3 cm maximum diameter. There is associated adjacent alveolar opacities suggestive of multifocal extension. There are innumerable diffuse bilateral pulmonary nodules in the varying sizes with marked interval progression. No pneumothorax or pleural effusion identified. Musculoskeletal: No chest wall mass or suspicious bone lesions identified. Right-sided Port-A-Cath tip is in the SVC. CT ABDOMEN PELVIS FINDINGS Hepatobiliary: No hepatic parenchymal abnormalities. There are calcified gallstones. Pancreas: Unremarkable. No pancreatic ductal dilatation or surrounding inflammatory changes. Spleen: Normal in size without focal abnormality. Adrenals/Urinary Tract: Adrenal glands are unremarkable. 1.5 cm right kidney lesion consistent with a cyst which is stable finding. No nephrolithiasis or hydronephrosis. Urinary bladder is empty. Stomach/Bowel: Stomach is within normal limits. Appendix appears normal. No evidence of bowel wall thickening, distention, or inflammatory changes. Vascular/Lymphatic: No significant vascular findings are present. No enlarged abdominal or pelvic lymph nodes. Reproductive: Uterus and bilateral adnexa are unremarkable. Other:  No abdominal  wall hernia or abnormality. No abdominopelvic ascites. Musculoskeletal: Grade 1 L4 anterolisthesis. Sclerotic lesion at L4 appears stable. Lytic lesion at L3 appears to be new. There is an early compression deformity consistent with incipient compression fracture at the L3 level. Mixed lytic and blastic changes right iliac bone and sacrum. IMPRESSION: 1. Marked progression of disease with a increase in size left apical lung mass and extensive diffuse pulmonary nodules which have increased in size and number. 2. Multifocal osseous metastatic disease also with evidence of progression. 3. L3 level osteolytic lesion with early loss of height concerning for an incipient compression fracture. Consider XRT consultation for prophylaxis. 4. Cholelithiasis. Electronically Signed   By: Layla Maw M.D.   On: 09/14/2022 13:22

## 2022-10-08 ENCOUNTER — Encounter: Payer: Self-pay | Admitting: *Deleted

## 2022-10-08 ENCOUNTER — Ambulatory Visit: Payer: PPO | Admitting: Internal Medicine

## 2022-10-08 ENCOUNTER — Other Ambulatory Visit: Payer: PPO

## 2022-10-08 ENCOUNTER — Ambulatory Visit: Payer: PPO

## 2022-10-09 ENCOUNTER — Other Ambulatory Visit: Payer: Self-pay | Admitting: Internal Medicine

## 2022-10-10 ENCOUNTER — Encounter: Payer: Self-pay | Admitting: Internal Medicine

## 2022-10-10 MED ORDER — TRAMADOL HCL 50 MG PO TABS: 50.0000 mg | ORAL_TABLET | Freq: Four times a day (QID) | ORAL | 0 refills | Status: DC | PRN

## 2022-10-14 ENCOUNTER — Telehealth: Payer: Self-pay

## 2022-10-14 ENCOUNTER — Inpatient Hospital Stay: Payer: PPO

## 2022-10-14 ENCOUNTER — Inpatient Hospital Stay (HOSPITAL_BASED_OUTPATIENT_CLINIC_OR_DEPARTMENT_OTHER): Payer: PPO | Admitting: Hospice and Palliative Medicine

## 2022-10-14 ENCOUNTER — Encounter: Payer: Self-pay | Admitting: Internal Medicine

## 2022-10-14 ENCOUNTER — Telehealth: Payer: Self-pay | Admitting: *Deleted

## 2022-10-14 ENCOUNTER — Other Ambulatory Visit: Payer: Self-pay

## 2022-10-14 ENCOUNTER — Encounter: Payer: Self-pay | Admitting: Hospice and Palliative Medicine

## 2022-10-14 VITALS — BP 84/54 | HR 103 | Temp 97.3°F | Resp 18

## 2022-10-14 VITALS — BP 103/51 | HR 79

## 2022-10-14 DIAGNOSIS — C7931 Secondary malignant neoplasm of brain: Secondary | ICD-10-CM

## 2022-10-14 DIAGNOSIS — C3490 Malignant neoplasm of unspecified part of unspecified bronchus or lung: Secondary | ICD-10-CM

## 2022-10-14 DIAGNOSIS — E860 Dehydration: Secondary | ICD-10-CM

## 2022-10-14 DIAGNOSIS — Z95828 Presence of other vascular implants and grafts: Secondary | ICD-10-CM

## 2022-10-14 DIAGNOSIS — Z5111 Encounter for antineoplastic chemotherapy: Secondary | ICD-10-CM | POA: Diagnosis not present

## 2022-10-14 LAB — CBC WITH DIFFERENTIAL (CANCER CENTER ONLY)
Abs Immature Granulocytes: 0.25 10*3/uL — ABNORMAL HIGH (ref 0.00–0.07)
Basophils Absolute: 0.1 10*3/uL (ref 0.0–0.1)
Basophils Relative: 2 %
Eosinophils Absolute: 0.1 10*3/uL (ref 0.0–0.5)
Eosinophils Relative: 2 %
HCT: 31.6 % — ABNORMAL LOW (ref 36.0–46.0)
Hemoglobin: 10.5 g/dL — ABNORMAL LOW (ref 12.0–15.0)
Immature Granulocytes: 7 %
Lymphocytes Relative: 24 %
Lymphs Abs: 0.8 10*3/uL (ref 0.7–4.0)
MCH: 32.2 pg (ref 26.0–34.0)
MCHC: 33.2 g/dL (ref 30.0–36.0)
MCV: 96.9 fL (ref 80.0–100.0)
Monocytes Absolute: 0.3 10*3/uL (ref 0.1–1.0)
Monocytes Relative: 7 %
Neutro Abs: 2.1 10*3/uL (ref 1.7–7.7)
Neutrophils Relative %: 58 %
Platelet Count: 65 10*3/uL — ABNORMAL LOW (ref 150–400)
RBC: 3.26 MIL/uL — ABNORMAL LOW (ref 3.87–5.11)
RDW: 13.2 % (ref 11.5–15.5)
Smear Review: NORMAL
WBC Count: 3.6 10*3/uL — ABNORMAL LOW (ref 4.0–10.5)
nRBC: 0 % (ref 0.0–0.2)

## 2022-10-14 LAB — CMP (CANCER CENTER ONLY)
ALT: 24 U/L (ref 0–44)
AST: 22 U/L (ref 15–41)
Albumin: 3.3 g/dL — ABNORMAL LOW (ref 3.5–5.0)
Alkaline Phosphatase: 304 U/L — ABNORMAL HIGH (ref 38–126)
Anion gap: 10 (ref 5–15)
BUN: 47 mg/dL — ABNORMAL HIGH (ref 8–23)
CO2: 17 mmol/L — ABNORMAL LOW (ref 22–32)
Calcium: 8.8 mg/dL — ABNORMAL LOW (ref 8.9–10.3)
Chloride: 109 mmol/L (ref 98–111)
Creatinine: 1.85 mg/dL — ABNORMAL HIGH (ref 0.44–1.00)
GFR, Estimated: 29 mL/min — ABNORMAL LOW (ref >60)
Glucose, Bld: 159 mg/dL — ABNORMAL HIGH (ref 70–99)
Potassium: 4.2 mmol/L (ref 3.5–5.1)
Sodium: 136 mmol/L (ref 135–145)
Total Bilirubin: 0.8 mg/dL (ref 0.3–1.2)
Total Protein: 6.7 g/dL (ref 6.5–8.1)

## 2022-10-14 MED ORDER — HEPARIN SOD (PORK) LOCK FLUSH 100 UNIT/ML IV SOLN
500.0000 [IU] | Freq: Once | INTRAVENOUS | Status: AC
  Administered 2022-10-14: 500 [IU]
  Filled 2022-10-14: qty 5

## 2022-10-14 MED ORDER — SODIUM CHLORIDE 0.9 % IV SOLN
Freq: Once | INTRAVENOUS | Status: AC
  Filled 2022-10-14: qty 250

## 2022-10-14 MED ORDER — SODIUM CHLORIDE 0.9% FLUSH
10.0000 mL | Freq: Once | INTRAVENOUS | Status: AC
  Administered 2022-10-14: 10 mL via INTRAVENOUS
  Filled 2022-10-14: qty 10

## 2022-10-14 NOTE — Telephone Encounter (Signed)
Patient needing bronch. Previous patient of Dr. Jayme Cloud but request to switch providers. Dr. Aundria Rud has availability on Thursday. Please refer to 10/11/2022 mychart message.    Dr. Jayme Cloud, please advise if okay to switch?

## 2022-10-14 NOTE — Telephone Encounter (Signed)
Okay with me 

## 2022-10-14 NOTE — Telephone Encounter (Signed)
Apt made for [facility]- see phone note

## 2022-10-14 NOTE — Progress Notes (Signed)
Symptom Management Clinic [facility] at [facility] [phone] [phone]  Patient Care Team: Pcp, No as PCP - General Glory Buff, RN as Oncology Nurse Navigator Michaelyn Barter, MD as Consulting Physician (Oncology)   NAME OF PATIENT: Victoria Foley  [id number]  May 10, 1950   DATE OF VISIT: 10/14/22  REASON FOR CONSULT: Victoria Foley is a 72 y.o. 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  Patient is status post RT to the right hip due to nondisplaced pathologic fracture.  She is status post palliative RT to brain.  She is on systemic chemotherapy.  INTERVAL HISTORY: Patient has had disease progression and is not a candidate for clinical trial.  Patient saw Dr. Alena Bills on 10/07/2022 and was rotated to [facility] chemotherapy.  She received cycle 1 on 10/07/2022.  Patient requested [facility] and IV fluids today for feeling of weakness postchemotherapy.    She endorses poor oral intake.  She has had some weight loss over the past week.  Denies fever or chills.  No nausea, vomiting, diarrhea, or constipation.  Denies pain.  She does endorse numbness and tingling in her fingertips but states that it is not severe. Denies urinary complaints. Patient offers no further specific complaints today.   PAST MEDICAL HISTORY: Past Medical History:  Diagnosis Date   Anemia    Anxiety    Arthritis    COVID-19    03/2021   Hypertension    Pneumonia    Pre-diabetes    Prediabetes    Sciatica     PAST SURGICAL HISTORY:  Past Surgical History:  Procedure Laterality Date   CESAREAN SECTION     COLONOSCOPY W/ POLYPECTOMY     ECTOPIC PREGNANCY SURGERY     FEMUR SURGERY     left, s/p rod for fracture   IR IMAGING GUIDED PORT INSERTION  12/11/2021   TUBAL LIGATION      HEMATOLOGY/ONCOLOGY HISTORY:  Oncology History  Primary lung  adenocarcinoma (HCC)  10/10/2021 Initial Diagnosis   Primary lung adenocarcinoma (HCC) Stage IV  Patient seen by PCP for wheezing for 2 weeks. Imaging with CXR followed by CT chest showed Spiculated 2.1 x 2.2 cm left upper lobe pulmonary nodule with associated pleural tethering. Innumerable subcentimeter pulmonary nodules throughout the lungs.      11/14/2021 PET scan   IMPRESSION: 1. Left suprahilar nodule with maximum SUV 4.5, and a more distal 2.2 cm left upper lobe nodule with maximum SUV of 5.5. 2. Innumerable scattered small pulmonary nodules, most of which are under 5 mm in diameter, throughout both lungs. Some are cavitary. Possibilities include cavitating hematogenous disseminated malignancy versus infectious etiology subset as septic emboli. Most of the nodules are stable, some are minimally enlarged compared to 10/10/2021. 3. Abnormal mild permeative type bony findings in the right hemipelvis associated with substantially accentuated metabolic activity. Possible associated pathologic fracture through the right quadrilateral plate and right acetabulum. The findings involve the right ilium and ischium but also the right lateral sacral ala and a small focus in the left sacrum.    Pathology Results   She had transbronchial biopsies by Dr. Jayme Cloud on 11/19/21.  Pathology from LUL nodule showed adenocarcinoma, consistent with lung primary.    11/19/2021 Cancer Staging   Staging form: Lung, AJCC 8th Edition - Clinical stage from 11/19/2021: Stage IVB (cT1c, cM1c) - Signed by Michaelyn Barter, MD on 11/27/2021 Stage prefix: Initial diagnosis  11/27/2021 Imaging   MRI pelvis  IMPRESSION: 1. Abnormal marrow signal throughout the right ilium involving the right acetabulum, right superior pubic ramus and right inferior pubic ramus, right side of the sacrum and a smaller focus of abnormal marrow lesion in the left side of the sacrum. Findings are concerning for metastatic disease. 2.  Nondisplaced pathologic fracture of the quadrilateral plate of the right acetabulum. 3. Mild muscle edema in the right gluteus minimus and medius muscles likely reflecting mild muscle strain.  MRI brain  IMPRESSION: No evidence of intracranial metastatic disease.   Indeterminate 1 cm left frontal calvarium lesion.   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  MEDICATIONS:  Current Outpatient Medications  Medication Sig Dispense Refill   albuterol (VENTOLIN HFA) 108 (90 Base) MCG/ACT inhaler Inhale 2 puffs into the lungs every 6 (six) hours as needed for wheezing or shortness of breath. 18 g 0   Azelastine HCl 137 MCG/SPRAY SOLN Place 2 sprays into both nostrils 2 (two) times daily.     Cholecalciferol (VITAMIN D-3 PO) Take by mouth.     dexamethasone (DECADRON) 4 MG tablet Take 1 tablet (4 mg total) by mouth daily. 20 tablet 0   fluticasone (FLONASE) 50 MCG/ACT nasal spray USE TWO SPRAYS IN EACH NOSTRIL ONCE DAILY prn 16 g 12   loratadine (CLARITIN) 10 MG tablet TAKE ONE TABLET BY MOUTH DAILY AS NEEDED FOR ALLERGIES 90 tablet 3   losartan (COZAAR) 100 MG tablet Take 1 tablet (100 mg total) by mouth daily. 90 tablet 3   MULTIPLE VITAMIN PO Take 1 tablet by mouth daily.     nebivolol (BYSTOLIC) 2.5 MG tablet  Take 1 tablet (2.5 mg total) by mouth daily. 90 tablet 0   traMADol (ULTRAM) 50 MG tablet Take 1 tablet (50 mg total) by mouth every 6 (six) hours as needed. 60 tablet 0   No current facility-administered medications for this visit.   Facility-Administered Medications Ordered in Other Visits  Medication Dose Route Frequency Provider Last Rate Last Admin   heparin lock flush 100 UNIT/ML injection            heparin lock flush 100 unit/mL  500 Units Intravenous Once Michaelyn Barter, MD       sodium chloride flush (NS) 0.9 % injection 10 mL  10 mL Intravenous Once Michaelyn Barter, MD        VITAL SIGNS: There were no vitals taken for this visit. There were no vitals filed for this visit.   Estimated body mass index is 27.96 kg/m as calculated from the following:   Height as of 06/04/22: 4' 11.5" (1.511 m).   Weight as of 10/07/22: 140 lb 12.8 oz (63.9 kg).  LABS: CBC:    Component Value Date/Time  WBC 6.9 10/07/2022 0847   WBC 4.1 09/17/2022 1254   HGB 10.2 (L) 10/07/2022 0847   HCT 30.8 (L) 10/07/2022 0847   PLT 164 10/07/2022 0847   MCV 96.9 10/07/2022 0847   NEUTROABS 5.8 10/07/2022 0847   LYMPHSABS 0.7 10/07/2022 0847   MONOABS 0.3 10/07/2022 0847   EOSABS 0.0 10/07/2022 0847   BASOSABS 0.0 10/07/2022 0847   Comprehensive Metabolic Panel:    Component Value Date/Time   NA 137 10/07/2022 0847   K 4.2 10/07/2022 0847   CL 108 10/07/2022 0847   CO2 20 (L) 10/07/2022 0847   BUN 37 (H) 10/07/2022 0847   CREATININE 1.56 (H) 10/07/2022 0847   GLUCOSE 117 (H) 10/07/2022 0847   CALCIUM 8.9 10/07/2022 0847   AST 24 10/07/2022 0847   ALT 24 10/07/2022 0847   ALKPHOS 342 (H) 10/07/2022 0847   BILITOT 0.7 10/07/2022 0847   PROT 7.1 10/07/2022 0847   ALBUMIN 3.2 (L) 10/07/2022 0847    RADIOGRAPHIC STUDIES: No results found.  PERFORMANCE STATUS (ECOG) : 1 - Symptomatic but completely ambulatory  Review of Systems Unless otherwise noted, a complete review of systems is  negative.  Physical Exam General: NAD Cardiovascular: regular rate and rhythm Pulmonary: clear ant fields Abdomen: soft, nontender, + bowel sounds GU: no suprapubic tenderness Extremities: no edema, no joint deformities Skin: no rashes Neurological: Weakness but otherwise nonfocal  IMPRESSION/PLAN: Dehydration -Labs appear consistent with dehydration.  Patient is orthostatic.  Proceed with IV fluids today.  Patient would likely benefit from intermittent supportive care.  Poor oral intake -recommended high-calorie/high-protein foods.  Recommended starting oral nutritional supplements twice daily.  Follow-up with nutrition.  Neutropenia/thrombocytopenia-patient should be near nadir.  Trend labs.  CIPN -patient would like to monitor symptoms for now.  Discussed trial of gabapentin or duloxetine if needed.  We also discussed use of acupuncture.  Plan to bring patient back next week for labs/fluids   Patient expressed understanding and was in agreement with this plan. She also understands that She can call clinic at any time with any questions, concerns, or complaints.   Thank you for allowing me to participate in the care of this very pleasant patient.   Time Total: 20 minutes  Visit consisted of counseling and education dealing with the complex and emotionally intense issues of symptom management in the setting of serious illness.Greater than 50%  of this time was spent counseling and coordinating care related to the above assessment and plan.  Signed by: Laurette Schimke, PhD, NP-C

## 2022-10-14 NOTE — Telephone Encounter (Addendum)
Returned pt's phone call. Pt is feeling weak and requesting to see app for iv fluids today. Also reports numbness and tingling in hands/feet. No gi symptoms or fevers. Apts made for 1030 am to see Josh/port labs and iv fluids.

## 2022-10-14 NOTE — Telephone Encounter (Signed)
Per Dr. Jayme Cloud and Dr. Aundria Rud verbally- okay to switch.   Spoke to patient and scheduled 10/20/2022 at 3:30. Nothing further needed.

## 2022-10-17 ENCOUNTER — Encounter: Payer: Self-pay | Admitting: Student in an Organized Health Care Education/Training Program

## 2022-10-17 ENCOUNTER — Ambulatory Visit: Payer: PPO | Admitting: Student in an Organized Health Care Education/Training Program

## 2022-10-17 VITALS — BP 118/62 | HR 97 | Temp 98.0°F | Ht 59.5 in | Wt 136.0 lb

## 2022-10-17 DIAGNOSIS — C3492 Malignant neoplasm of unspecified part of left bronchus or lung: Secondary | ICD-10-CM

## 2022-10-17 NOTE — Progress Notes (Signed)
Assessment & Plan:   1. Primary adenocarcinoma of left lung [facility])  The patient is here to discuss their imaging abnormalities which include a left upper lobe nodule that was biopsied and diagnosed as stage IV NSCLCa (adenocarcinoma). She had initial response to therapy with subsequent progression. Initial NGS testing was negative for driver mutations, and updated recommendations from her oncological providers is to repeat biopsy and send for repeat NGS was an RNA fusion panel.  I have reviewed her multiple CT scans, and note progression in the size of the tumor in the left upper lobe as well as multiple other pulmonary nodules on the most recent scan from May of 2024. I discussed these findings with Victoria Foley and her daughter. We also reviewed and revisited notes from her oncology team and their recommendations to have a repeat biopsy to send for NGS. They are agreeable to proceed with repeat biopsy.  We also discussed the approach to obtaining a tissue diagnosis which would include robotic assisted navigational bronchoscopy with endobronchial ultrasound guided sampling.  We also discussed the risks associated with the procedure which include a 2% risk of pneumothorax, infection, bleeding, and nondiagnostic procedure in detail.  I explained that patients typically are able to return home the same day of the procedure, but in rare cases admission to the hospital for observation and treatment is required.  After our discussion, the patient elected to proceed with the procedure  Recommendations:  - Procedural/ Surgical Case Request: ROBOTIC ASSISTED NAVIGATIONAL BRONCHOSCOPY; Future - CT Super D Chest Wo Contrast; Future   Return if symptoms worsen or fail to improve.  I spent 30 minutes caring for this patient today, including preparing to see the patient, obtaining a medical history , reviewing a separately obtained history, performing a medically appropriate examination and/or evaluation,  counseling and educating the patient/family/caregiver, ordering medications, tests, or procedures, documenting clinical information in the electronic health record, and independently interpreting results (not separately reported/billed) and communicating results to the patient/family/caregiver  Victoria Chute, MD [facility] 10/17/2022 4:23 PM    End of visit medications:  No orders of the defined types were placed in this encounter.    Current Outpatient Medications:    albuterol (VENTOLIN HFA) 108 (90 Base) MCG/ACT inhaler, Inhale 2 puffs into the lungs every 6 (six) hours as needed for wheezing or shortness of breath., Disp: 18 g, Rfl: 0   Azelastine HCl 137 MCG/SPRAY SOLN, Place 2 sprays into both nostrils 2 (two) times daily., Disp: , Rfl:    Cholecalciferol (VITAMIN D-3 PO), Take by mouth., Disp: , Rfl:    dexamethasone (DECADRON) 4 MG tablet, Take 1 tablet (4 mg total) by mouth daily., Disp: 20 tablet, Rfl: 0   fluticasone (FLONASE) 50 MCG/ACT nasal spray, USE TWO SPRAYS IN EACH NOSTRIL ONCE DAILY prn, Disp: 16 g, Rfl: 12   loratadine (CLARITIN) 10 MG tablet, TAKE ONE TABLET BY MOUTH DAILY AS NEEDED FOR ALLERGIES, Disp: 90 tablet, Rfl: 3   losartan (COZAAR) 100 MG tablet, Take 1 tablet (100 mg total) by mouth daily., Disp: 90 tablet, Rfl: 3   MULTIPLE VITAMIN PO, Take 1 tablet by mouth daily., Disp: , Rfl:    nebivolol (BYSTOLIC) 2.5 MG tablet, Take 1 tablet (2.5 mg total) by mouth daily., Disp: 90 tablet, Rfl: 0   traMADol (ULTRAM) 50 MG tablet, Take 1 tablet (50 mg total) by mouth every 6 (six) hours as needed., Disp: 60 tablet, Rfl: 0 No current facility-administered medications for  this visit.  Facility-Administered Medications Ordered in Other Visits:    heparin lock flush 100 UNIT/ML injection, , , ,    heparin lock flush 100 unit/mL, 500 Units, Intravenous, Once, Victoria Barter, MD   sodium chloride flush (NS) 0.9 % injection 10 mL, 10 mL, Intravenous,  Once, Victoria Barter, MD   Subjective:   PATIENT ID: Victoria Foley GENDER: female DOB: Jan 15, 1951, MRN: [id number]  Chief Complaint  Patient presents with   Follow-up    CT 5/21-dry cough.     HPI  Victoria Foley is a pleasant 72 year old female with a past medical history of Stage IV NSCLCa who is presenting to clinic for consideration of repeat biopsy. Patient was previously seen by Dr. Jayme Foley but has requested to change providers. She is accompanied today by her daughter.  Victoria Foley was noted to have pulmonary nodules on a chest Xray ordered by her primary care physician in June of 2023. She was referred to pulmonology and a CT scan of the chest showed a 2.1 x 2.2 cm left upper lobe nodule in addition to multiple other sub-centimeter pulmonary nodules. Patient underwent robotic assisted navigational bronchoscopy on 11/20/2022 with biopsies returning positive for non-small cell lung cancer (adenocarcinoma). [facility] testing was negative for targetable/actionable driver mutations, PD-L1 expression was 1%. She was seen by Dr. Alena Foley in August of 2023 for stage IV lung adenocarcinoma (given findings on PET/CT) and she was initiated on chemotherapy (Carboplatin + pemetrexed) and immunotherapy (with pembrolizumab). She was also started on radiation therapy. She was seen for an opinion at [facility] by Dr. Kemper Foley, with recommendation for fusion panel testing to assess for further targetable mutations. Lung NGS fusion panel was sent on blood which is reportedly negative; I was unable to personally review the result of this test.   Initial restaging CT in November of 2023 showed good response to treatment and stable disease in February of 2024. Repeat imaging in May of 2024 showed progression of disease with an increase in size of the left apical lung mass. Patient seen again by her oncological team and a recommendation was made to repeat biopsy and send for repeat biopsy and sending tumor to a  vendor that has an RNA fusion panel. Patient is presenting today to clinic for consideration of biopsy.  Symptoms are at baseline. She reports some exertional dyspnea and a chronic intermittent cough. Otherwise denies chest pain, chest tightness, wheezing, hemoptysis, or signs of systemic infection.  Ancillary information including prior medications, full medical/surgical/family/social histories, and PFTs (when available) are listed below and have been reviewed.   Review of Systems  Constitutional:  Positive for weight loss. Negative for chills, fever and malaise/fatigue.  Respiratory:  Positive for cough and shortness of breath. Negative for hemoptysis, sputum production and wheezing.   Cardiovascular:  Negative for chest pain.     Objective:   Vitals:   10/17/22 1536  BP: 118/62  Pulse: 97  Temp: 98 F (36.7 C)  TempSrc: Temporal  SpO2: 100%  Weight: 136 lb (61.7 kg)  Height: 4' 11.5" (1.511 m)   100% on RA BMI Readings from Last 3 Encounters:  10/17/22 27.01 kg/m  10/07/22 27.96 kg/m  09/17/22 28.24 kg/m   Wt Readings from Last 3 Encounters:  10/17/22 136 lb (61.7 kg)  10/07/22 140 lb 12.8 oz (63.9 kg)  09/17/22 142 lb 3.2 oz (64.5 kg)    Physical Exam Constitutional:      Appearance: Normal appearance.  HENT:  Mouth/Throat:     Mouth: Mucous membranes are moist.  Cardiovascular:     Rate and Rhythm: Normal rate and regular rhythm.     Pulses: Normal pulses.     Heart sounds: Normal heart sounds.  Pulmonary:     Effort: Pulmonary effort is normal.     Breath sounds: Normal breath sounds. No wheezing.  Neurological:     General: No focal deficit present.     Mental Status: She is alert and oriented to person, place, and time. Mental status is at baseline.       Ancillary Information    Past Medical History:  Diagnosis Date   Anemia    Anxiety    Arthritis    COVID-19    03/2021   Hypertension    Pneumonia    Pre-diabetes    Prediabetes     Sciatica      Family History  Problem Relation Age of Onset   Diabetes Mother    Hypertension Mother    Heart disease Mother    Cancer Mother        breast and stomach   Breast cancer Mother 36   Diabetes Father    Hypertension Father    Heart disease Father    Kidney disease Father      Past Surgical History:  Procedure Laterality Date   CESAREAN SECTION     COLONOSCOPY W/ POLYPECTOMY     ECTOPIC PREGNANCY SURGERY     FEMUR SURGERY     left, s/p rod for fracture   IR IMAGING GUIDED PORT INSERTION  12/11/2021   TUBAL LIGATION      Social History   Socioeconomic History   Marital status: Widowed    Spouse name: Not on file   Number of children: Not on file   Years of education: Not on file   Highest education level: Not on file  Occupational History   Not on file  Tobacco Use   Smoking status: Never   Smokeless tobacco: Never  Vaping Use   Vaping Use: Never used  Substance and Sexual Activity   Alcohol use: No   Drug use: Never   Sexual activity: Not Currently  Other Topics Concern   Not on file  Social History Narrative   Lives in [place] but husband died 06-15-20 motorcycle accident bday 10/19/20 married 47 years   No pets   2 daughters and 3 granddaughters      Work - [employer] retired 09/2019   Diet - regular diet   Exercise - none   Social Determinants of Health   Financial Resource Strain: Low Risk  (12/20/2021)   Overall Financial Resource Strain (CARDIA)    Difficulty of Paying Living Expenses: Not hard at all  Food Insecurity: No Food Insecurity (12/20/2021)   Hunger Vital Sign    Worried About Running Out of Food in the Last Year: Never true    Ran Out of Food in the Last Year: Never true  Transportation Needs: No Transportation Needs (12/20/2021)   PRAPARE - Administrator, Civil Service (Medical): No    Lack of Transportation (Non-Medical): No  Physical Activity: Not on file  Stress: No Stress Concern Present  (12/20/2021)   [employer] of Occupational Health - Occupational Stress Questionnaire    Feeling of Stress : Not at all  Social Connections: Unknown (12/19/2020)   Social Connection and Isolation Panel [NHANES]    Frequency of Communication with Friends and Family: More  than three times a week    Frequency of Social Gatherings with Friends and Family: Not on file    Attends Religious Services: Not on file    Active Member of Clubs or Organizations: Not on file    Attends Banker Meetings: Not on file    Marital Status: Not on file  Intimate Partner Violence: Not At Risk (12/20/2021)   Humiliation, Afraid, Rape, and Kick questionnaire    Fear of Current or Ex-Partner: No    Emotionally Abused: No    Physically Abused: No    Sexually Abused: No     Allergies  Allergen Reactions   Diclofenac Hives   Lisinopril     cough   Sulfa Antibiotics     ? Allergy      CBC    Component Value Date/Time   WBC 3.6 (L) 10/14/2022 1057   WBC 4.1 09/17/2022 1254   RBC 3.26 (L) 10/14/2022 1057   HGB 10.5 (L) 10/14/2022 1057   HCT 31.6 (L) 10/14/2022 1057   PLT 65 (L) 10/14/2022 1057   MCV 96.9 10/14/2022 1057   MCH 32.2 10/14/2022 1057   MCHC 33.2 10/14/2022 1057   RDW 13.2 10/14/2022 1057   LYMPHSABS 0.8 10/14/2022 1057   MONOABS 0.3 10/14/2022 1057   EOSABS 0.1 10/14/2022 1057   BASOSABS 0.1 10/14/2022 1057    Pulmonary Functions Testing Results:     No data to display          Outpatient Medications Prior to Visit  Medication Sig Dispense Refill   albuterol (VENTOLIN HFA) 108 (90 Base) MCG/ACT inhaler Inhale 2 puffs into the lungs every 6 (six) hours as needed for wheezing or shortness of breath. 18 g 0   Azelastine HCl 137 MCG/SPRAY SOLN Place 2 sprays into both nostrils 2 (two) times daily.     Cholecalciferol (VITAMIN D-3 PO) Take by mouth.     dexamethasone (DECADRON) 4 MG tablet Take 1 tablet (4 mg total) by mouth daily. 20 tablet 0   fluticasone  (FLONASE) 50 MCG/ACT nasal spray USE TWO SPRAYS IN EACH NOSTRIL ONCE DAILY prn 16 g 12   loratadine (CLARITIN) 10 MG tablet TAKE ONE TABLET BY MOUTH DAILY AS NEEDED FOR ALLERGIES 90 tablet 3   losartan (COZAAR) 100 MG tablet Take 1 tablet (100 mg total) by mouth daily. 90 tablet 3   MULTIPLE VITAMIN PO Take 1 tablet by mouth daily.     nebivolol (BYSTOLIC) 2.5 MG tablet Take 1 tablet (2.5 mg total) by mouth daily. 90 tablet 0   traMADol (ULTRAM) 50 MG tablet Take 1 tablet (50 mg total) by mouth every 6 (six) hours as needed. 60 tablet 0   Facility-Administered Medications Prior to Visit  Medication Dose Route Frequency Provider Last Rate Last Admin   heparin lock flush 100 UNIT/ML injection            heparin lock flush 100 unit/mL  500 Units Intravenous Once Victoria Barter, MD       sodium chloride flush (NS) 0.9 % injection 10 mL  10 mL Intravenous Once Victoria Barter, MD

## 2022-10-18 ENCOUNTER — Encounter: Payer: Self-pay | Admitting: Student in an Organized Health Care Education/Training Program

## 2022-10-18 ENCOUNTER — Encounter: Payer: Self-pay | Admitting: Internal Medicine

## 2022-10-18 ENCOUNTER — Other Ambulatory Visit: Payer: Self-pay | Admitting: Internal Medicine

## 2022-10-18 DIAGNOSIS — C3490 Malignant neoplasm of unspecified part of unspecified bronchus or lung: Secondary | ICD-10-CM

## 2022-10-22 ENCOUNTER — Telehealth: Payer: Self-pay | Admitting: *Deleted

## 2022-10-22 ENCOUNTER — Encounter: Payer: Self-pay | Admitting: *Deleted

## 2022-10-22 NOTE — Telephone Encounter (Signed)
Nurse called and spoke with Victoria Foley that Dr Alena Bills had been notified that bronchoscopy had been cancelled and they would call her to reschedule.  Victoria Foley verbalized understanding.  RN offered Victoria Foley earlier chemotherapy appt for October 31, 2022 but patient preferred to keep November 04, 2022 scheduled appointment.

## 2022-10-23 ENCOUNTER — Telehealth: Payer: Self-pay

## 2022-10-23 NOTE — Telephone Encounter (Signed)
Received message form Dr. Aundria Rud via epic secure chat: cancelling patient's bronch. endo suite isn't able to accommodate 7/11 unfortunately and the other dates won't work with her chemo schedule. will need to push the procedure to the end of August. I will update you regarding the date but it will likely be with [facility] or [facility]. Will need to reschedule the CT.  Dr. Aundria Rud has spoken to patient's daughter and made her aware. Synetta Fail is aware that CT will need to be rescheduled.  Nothing further needed.

## 2022-10-24 ENCOUNTER — Other Ambulatory Visit: Payer: Self-pay

## 2022-10-25 ENCOUNTER — Other Ambulatory Visit: Payer: Self-pay | Admitting: Internal Medicine

## 2022-10-25 ENCOUNTER — Inpatient Hospital Stay: Payer: PPO | Attending: Internal Medicine

## 2022-10-25 ENCOUNTER — Encounter: Payer: Self-pay | Admitting: Pulmonary Disease

## 2022-10-25 ENCOUNTER — Ambulatory Visit
Admission: RE | Admit: 2022-10-25 | Discharge: 2022-10-25 | Disposition: A | Discharge status: Home / Self Care | Payer: PPO | Source: Ambulatory Visit | Attending: Internal Medicine | Admitting: Internal Medicine

## 2022-10-25 ENCOUNTER — Inpatient Hospital Stay: Payer: PPO

## 2022-10-25 ENCOUNTER — Other Ambulatory Visit: Payer: Self-pay | Admitting: *Deleted

## 2022-10-25 DIAGNOSIS — C3490 Malignant neoplasm of unspecified part of unspecified bronchus or lung: Secondary | ICD-10-CM

## 2022-10-25 DIAGNOSIS — R7989 Other specified abnormal findings of blood chemistry: Secondary | ICD-10-CM | POA: Diagnosis not present

## 2022-10-25 DIAGNOSIS — Z5111 Encounter for antineoplastic chemotherapy: Secondary | ICD-10-CM | POA: Diagnosis present

## 2022-10-25 DIAGNOSIS — N1832 Chronic kidney disease, stage 3b: Secondary | ICD-10-CM | POA: Insufficient documentation

## 2022-10-25 DIAGNOSIS — Z79899 Other long term (current) drug therapy: Secondary | ICD-10-CM | POA: Insufficient documentation

## 2022-10-25 DIAGNOSIS — T451X5A Adverse effect of antineoplastic and immunosuppressive drugs, initial encounter: Secondary | ICD-10-CM | POA: Insufficient documentation

## 2022-10-25 DIAGNOSIS — M129 Arthropathy, unspecified: Secondary | ICD-10-CM | POA: Insufficient documentation

## 2022-10-25 DIAGNOSIS — E860 Dehydration: Secondary | ICD-10-CM | POA: Diagnosis not present

## 2022-10-25 DIAGNOSIS — Z7952 Long term (current) use of systemic steroids: Secondary | ICD-10-CM | POA: Diagnosis not present

## 2022-10-25 DIAGNOSIS — R5381 Other malaise: Secondary | ICD-10-CM | POA: Diagnosis not present

## 2022-10-25 DIAGNOSIS — D631 Anemia in chronic kidney disease: Secondary | ICD-10-CM | POA: Diagnosis not present

## 2022-10-25 DIAGNOSIS — R609 Edema, unspecified: Secondary | ICD-10-CM | POA: Diagnosis not present

## 2022-10-25 DIAGNOSIS — D6481 Anemia due to antineoplastic chemotherapy: Secondary | ICD-10-CM | POA: Diagnosis not present

## 2022-10-25 DIAGNOSIS — Z8616 Personal history of COVID-19: Secondary | ICD-10-CM | POA: Insufficient documentation

## 2022-10-25 DIAGNOSIS — R110 Nausea: Secondary | ICD-10-CM | POA: Diagnosis not present

## 2022-10-25 DIAGNOSIS — C7931 Secondary malignant neoplasm of brain: Secondary | ICD-10-CM | POA: Insufficient documentation

## 2022-10-25 DIAGNOSIS — C3412 Malignant neoplasm of upper lobe, left bronchus or lung: Secondary | ICD-10-CM | POA: Diagnosis not present

## 2022-10-25 DIAGNOSIS — M533 Sacrococcygeal disorders, not elsewhere classified: Secondary | ICD-10-CM | POA: Insufficient documentation

## 2022-10-25 DIAGNOSIS — Z95828 Presence of other vascular implants and grafts: Secondary | ICD-10-CM

## 2022-10-25 DIAGNOSIS — C7951 Secondary malignant neoplasm of bone: Secondary | ICD-10-CM | POA: Insufficient documentation

## 2022-10-25 DIAGNOSIS — I129 Hypertensive chronic kidney disease with stage 1 through stage 4 chronic kidney disease, or unspecified chronic kidney disease: Secondary | ICD-10-CM | POA: Diagnosis not present

## 2022-10-25 DIAGNOSIS — R200 Anesthesia of skin: Secondary | ICD-10-CM | POA: Diagnosis not present

## 2022-10-25 DIAGNOSIS — R748 Abnormal levels of other serum enzymes: Secondary | ICD-10-CM | POA: Diagnosis not present

## 2022-10-25 DIAGNOSIS — R202 Paresthesia of skin: Secondary | ICD-10-CM | POA: Diagnosis not present

## 2022-10-25 LAB — CBC (CANCER CENTER ONLY)
HCT: 26.6 % — ABNORMAL LOW (ref 36.0–46.0)
Hemoglobin: 8.7 g/dL — ABNORMAL LOW (ref 12.0–15.0)
MCH: 32.5 pg (ref 26.0–34.0)
MCHC: 32.7 g/dL (ref 30.0–36.0)
MCV: 99.3 fL (ref 80.0–100.0)
Platelet Count: 112 10*3/uL — ABNORMAL LOW (ref 150–400)
RBC: 2.68 MIL/uL — ABNORMAL LOW (ref 3.87–5.11)
RDW: 14.6 % (ref 11.5–15.5)
WBC Count: 4.9 10*3/uL (ref 4.0–10.5)
nRBC: 0 % (ref 0.0–0.2)

## 2022-10-25 LAB — BASIC METABOLIC PANEL - CANCER CENTER ONLY
Anion gap: 8 (ref 5–15)
BUN: 24 mg/dL — ABNORMAL HIGH (ref 8–23)
CO2: 21 mmol/L — ABNORMAL LOW (ref 22–32)
Calcium: 9.1 mg/dL (ref 8.9–10.3)
Chloride: 110 mmol/L (ref 98–111)
Creatinine: 1.36 mg/dL — ABNORMAL HIGH (ref 0.44–1.00)
GFR, Estimated: 42 mL/min — ABNORMAL LOW (ref >60)
Glucose, Bld: 110 mg/dL — ABNORMAL HIGH (ref 70–99)
Potassium: 4.1 mmol/L (ref 3.5–5.1)
Sodium: 139 mmol/L (ref 135–145)

## 2022-10-25 MED ORDER — SODIUM CHLORIDE 0.9% FLUSH
10.0000 mL | Freq: Once | INTRAVENOUS | Status: AC | PRN
  Administered 2022-10-25: 10 mL
  Filled 2022-10-25: qty 10

## 2022-10-25 MED ORDER — HEPARIN SOD (PORK) LOCK FLUSH 100 UNIT/ML IV SOLN
500.0000 [IU] | Freq: Once | INTRAVENOUS | Status: AC | PRN
  Administered 2022-10-25: 500 [IU]
  Filled 2022-10-25: qty 5

## 2022-10-25 MED ORDER — SODIUM CHLORIDE 0.9 % IV SOLN
Freq: Once | INTRAVENOUS | Status: AC
  Filled 2022-10-25: qty 250

## 2022-10-25 MED ORDER — GADOBUTROL 1 MMOL/ML IV SOLN
6.0000 mL | Freq: Once | INTRAVENOUS | Status: AC | PRN
  Administered 2022-10-25: 6 mL via INTRAVENOUS

## 2022-10-28 ENCOUNTER — Ambulatory Visit: Payer: PPO

## 2022-10-28 ENCOUNTER — Ambulatory Visit: Payer: PPO | Admitting: Internal Medicine

## 2022-10-28 ENCOUNTER — Other Ambulatory Visit: Payer: PPO

## 2022-10-29 ENCOUNTER — Ambulatory Visit: Payer: PPO | Admitting: Internal Medicine

## 2022-10-29 ENCOUNTER — Encounter: Payer: Self-pay | Admitting: Internal Medicine

## 2022-10-29 ENCOUNTER — Other Ambulatory Visit: Payer: PPO

## 2022-10-29 ENCOUNTER — Ambulatory Visit: Payer: PPO

## 2022-10-30 ENCOUNTER — Ambulatory Visit
Admission: RE | Admit: 2022-10-30 | Discharge: 2022-10-30 | Disposition: A | Discharge status: Home / Self Care | Payer: PPO | Source: Ambulatory Visit | Attending: Radiation Oncology | Admitting: Radiation Oncology

## 2022-10-30 ENCOUNTER — Other Ambulatory Visit: Payer: Self-pay

## 2022-10-30 ENCOUNTER — Inpatient Hospital Stay: Payer: PPO

## 2022-10-30 ENCOUNTER — Encounter: Payer: Self-pay | Admitting: Radiation Oncology

## 2022-10-30 ENCOUNTER — Inpatient Hospital Stay: Payer: PPO | Admitting: Medical Oncology

## 2022-10-30 ENCOUNTER — Encounter: Payer: Self-pay | Admitting: Medical Oncology

## 2022-10-30 ENCOUNTER — Inpatient Hospital Stay (HOSPITAL_BASED_OUTPATIENT_CLINIC_OR_DEPARTMENT_OTHER): Payer: PPO | Admitting: Medical Oncology

## 2022-10-30 ENCOUNTER — Ambulatory Visit: Payer: PPO

## 2022-10-30 VITALS — BP 123/66 | HR 83 | Temp 96.0°F | Wt 139.0 lb

## 2022-10-30 VITALS — BP 123/72 | HR 102 | Resp 16 | Ht 59.0 in | Wt 139.1 lb

## 2022-10-30 DIAGNOSIS — C7931 Secondary malignant neoplasm of brain: Secondary | ICD-10-CM

## 2022-10-30 DIAGNOSIS — C3490 Malignant neoplasm of unspecified part of unspecified bronchus or lung: Secondary | ICD-10-CM

## 2022-10-30 DIAGNOSIS — E860 Dehydration: Secondary | ICD-10-CM

## 2022-10-30 DIAGNOSIS — Z5111 Encounter for antineoplastic chemotherapy: Secondary | ICD-10-CM | POA: Diagnosis not present

## 2022-10-30 LAB — CMP (CANCER CENTER ONLY)
ALT: 15 U/L (ref 0–44)
AST: 21 U/L (ref 15–41)
Albumin: 3.8 g/dL (ref 3.5–5.0)
Alkaline Phosphatase: 361 U/L — ABNORMAL HIGH (ref 38–126)
Anion gap: 11 (ref 5–15)
BUN: 35 mg/dL — ABNORMAL HIGH (ref 8–23)
CO2: 20 mmol/L — ABNORMAL LOW (ref 22–32)
Calcium: 9.4 mg/dL (ref 8.9–10.3)
Chloride: 107 mmol/L (ref 98–111)
Creatinine: 1.4 mg/dL — ABNORMAL HIGH (ref 0.44–1.00)
GFR, Estimated: 40 mL/min — ABNORMAL LOW (ref >60)
Glucose, Bld: 117 mg/dL — ABNORMAL HIGH (ref 70–99)
Potassium: 3.9 mmol/L (ref 3.5–5.1)
Sodium: 138 mmol/L (ref 135–145)
Total Bilirubin: 0.4 mg/dL (ref 0.3–1.2)
Total Protein: 7.3 g/dL (ref 6.5–8.1)

## 2022-10-30 LAB — CBC (CANCER CENTER ONLY)
HCT: 29 % — ABNORMAL LOW (ref 36.0–46.0)
Hemoglobin: 9.5 g/dL — ABNORMAL LOW (ref 12.0–15.0)
MCH: 32.8 pg (ref 26.0–34.0)
MCHC: 32.8 g/dL (ref 30.0–36.0)
MCV: 100 fL (ref 80.0–100.0)
Platelet Count: 175 10*3/uL (ref 150–400)
RBC: 2.9 MIL/uL — ABNORMAL LOW (ref 3.87–5.11)
RDW: 14.9 % (ref 11.5–15.5)
WBC Count: 5.8 10*3/uL (ref 4.0–10.5)
nRBC: 0 % (ref 0.0–0.2)

## 2022-10-30 MED ORDER — HEPARIN SOD (PORK) LOCK FLUSH 100 UNIT/ML IV SOLN
500.0000 [IU] | Freq: Once | INTRAVENOUS | Status: AC
  Administered 2022-10-30: 500 [IU] via INTRAVENOUS
  Filled 2022-10-30: qty 5

## 2022-10-30 NOTE — Progress Notes (Signed)
No fluids today per Maralyn Sago. Patient deaccessed, discharged. Stable

## 2022-10-30 NOTE — Progress Notes (Unsigned)
Symptom Management Clinic [facility] at [facility] Telephone:([phone] Fax:(336) [phone]  Patient Care Team: Dana Allan, MD as PCP - General (Family Medicine) Glory Buff, RN as Oncology Nurse Navigator Michaelyn Barter, MD as Consulting Physician (Oncology)   Name of the patient: Victoria Foley  [id number]  November 15, 1950   Oncological History:  Victoria Foley is a 72 y.o. 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  Patient is status post RT to the right hip due to nondisplaced pathologic fracture.  She is status post palliative RT to brain.  She is on systemic chemotherapy.   Current Treatment:  Carbo Taxol s/p cycle 1 on 10/07/2022.   Date of visit: 10/30/22  Reason for Consult: Victoria Foley is a 72 y.o. female who presents today for:  Fatigue: Patient contacted the [facility] team regarding concerns of fatigue. She previously was seen  on 10/14/2022 in our [facility] clinic for weakness and was found to be hypotensive. Symptoms improved with IVF fluids but today she reports that symptoms have returned. She asked for IVF again and was added on today for [facility] evaluation and fluid consideration.      Denies any neurologic complaints. Denies recent fevers or illnesses. Denies any easy bleeding or bruising. Reports good appetite and denies weight loss. Denies chest pain. Denies any nausea, vomiting, constipation, or diarrhea. Denies urinary complaints. Patient offers no further specific complaints today.  Wt Readings from Last 3 Encounters:  10/30/22 139 lb 1.6 oz (63.1 kg)  10/17/22 136 lb (61.7 kg)  10/07/22 140 lb 12.8 oz (63.9 kg)     PAST MEDICAL HISTORY: Past Medical History:  Diagnosis Date   Anemia    Anxiety    Arthritis    COVID-19    03/2021   Hypertension    Pneumonia    Pre-diabetes    Prediabetes    Sciatica     PAST  SURGICAL HISTORY:  Past Surgical History:  Procedure Laterality Date   CESAREAN SECTION     COLONOSCOPY W/ POLYPECTOMY     ECTOPIC PREGNANCY SURGERY     FEMUR SURGERY     left, s/p rod for fracture   IR IMAGING GUIDED PORT INSERTION  12/11/2021   TUBAL LIGATION      HEMATOLOGY/ONCOLOGY HISTORY:  Oncology History  Primary lung adenocarcinoma (HCC)  10/10/2021 Initial Diagnosis   Primary lung adenocarcinoma (HCC) Stage IV  Patient seen by PCP for wheezing for 2 weeks. Imaging with CXR followed by CT chest showed Spiculated 2.1 x 2.2 cm left upper lobe pulmonary nodule with associated pleural tethering. Innumerable subcentimeter pulmonary nodules throughout the lungs.      11/14/2021 PET scan   IMPRESSION: 1. Left suprahilar nodule with maximum SUV 4.5, and a more distal 2.2 cm left upper lobe nodule with maximum SUV of 5.5. 2. Innumerable scattered small pulmonary nodules, most of which are under 5 mm in diameter, throughout both lungs. Some are cavitary. Possibilities include cavitating hematogenous disseminated malignancy versus infectious etiology subset as septic emboli. Most of the nodules are stable, some are minimally enlarged compared to 10/10/2021. 3. Abnormal mild permeative type bony findings in the right hemipelvis associated with substantially accentuated metabolic activity. Possible associated pathologic fracture through the right quadrilateral plate and right acetabulum. The findings involve the right ilium and ischium but also the right lateral sacral ala and a small focus in the left sacrum.  Pathology Results   She had transbronchial biopsies by Dr. Jayme Cloud on 11/19/21.  Pathology from LUL nodule showed adenocarcinoma, consistent with lung primary.    11/19/2021 Cancer Staging   Staging form: Lung, AJCC 8th Edition - Clinical stage from 11/19/2021: Stage IVB (cT1c, cM1c) - Signed by Michaelyn Barter, MD on 11/27/2021 Stage prefix: Initial diagnosis   11/27/2021  Imaging   MRI pelvis  IMPRESSION: 1. Abnormal marrow signal throughout the right ilium involving the right acetabulum, right superior pubic ramus and right inferior pubic ramus, right side of the sacrum and a smaller focus of abnormal marrow lesion in the left side of the sacrum. Findings are concerning for metastatic disease. 2. Nondisplaced pathologic fracture of the quadrilateral plate of the right acetabulum. 3. Mild muscle edema in the right gluteus minimus and medius muscles likely reflecting mild muscle strain.  MRI brain  IMPRESSION: No evidence of intracranial metastatic disease.   Indeterminate 1 cm left frontal calvarium lesion.   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  MEDICATIONS:  Current Outpatient Medications  Medication Sig Dispense Refill   albuterol (VENTOLIN HFA) 108 (90 Base) MCG/ACT inhaler Inhale 2 puffs into the lungs every 6 (six) hours as needed for wheezing or shortness of breath. 18 g 0   Azelastine HCl 137 MCG/SPRAY SOLN Place 2 sprays into both nostrils 2 (two) times daily.     Cholecalciferol (VITAMIN D-3 PO) Take by mouth.     dexamethasone (DECADRON) 4 MG tablet Take 1 tablet (4 mg total) by mouth daily. (Patient not taking: Reported on 10/30/2022) 20 tablet 0    fluticasone (FLONASE) 50 MCG/ACT nasal spray USE TWO SPRAYS IN EACH NOSTRIL ONCE DAILY prn 16 g 12   loratadine (CLARITIN) 10 MG tablet TAKE ONE TABLET BY MOUTH DAILY AS NEEDED FOR ALLERGIES 90 tablet 3   losartan (COZAAR) 100 MG tablet Take 1 tablet (100 mg total) by mouth daily. 90 tablet 3   MULTIPLE VITAMIN PO Take 1 tablet by mouth daily.     nebivolol (BYSTOLIC) 2.5 MG tablet Take 1 tablet (2.5 mg total) by mouth daily. 90 tablet 0   traMADol (ULTRAM) 50 MG tablet Take 1 tablet (50 mg total) by mouth every 6 (six) hours as needed. 60 tablet 0   No current facility-administered medications for this visit.   Facility-Administered Medications Ordered in Other Visits  Medication Dose Route Frequency Provider Last Rate Last Admin   heparin lock flush 100 UNIT/ML injection            heparin lock flush 100 unit/mL  500 Units Intravenous Once Michaelyn Barter, MD       sodium chloride flush (NS) 0.9 % injection 10 mL  10 mL Intravenous Once Michaelyn Barter, MD  VITAL SIGNS: There were no vitals taken for this visit. There were no vitals filed for this visit.  Estimated body mass index is 28.09 kg/m as calculated from the following:   Height as of an earlier encounter on 10/30/22: 4\' 11"  (1.499 m).   Weight as of an earlier encounter on 10/30/22: 139 lb 1.6 oz (63.1 kg).  LABS: CBC:    Component Value Date/Time   WBC 4.9 10/25/2022 1155   WBC 4.1 09/17/2022 1254   HGB 8.7 (L) 10/25/2022 1155   HCT 26.6 (L) 10/25/2022 1155   PLT 112 (L) 10/25/2022 1155   MCV 99.3 10/25/2022 1155   NEUTROABS 2.1 10/14/2022 1057   LYMPHSABS 0.8 10/14/2022 1057   MONOABS 0.3 10/14/2022 1057   EOSABS 0.1 10/14/2022 1057   BASOSABS 0.1 10/14/2022 1057   Comprehensive Metabolic Panel:    Component Value Date/Time   NA 139 10/25/2022 1155   K 4.1 10/25/2022 1155   CL 110 10/25/2022 1155   CO2 21 (L) 10/25/2022 1155   BUN 24 (H) 10/25/2022 1155   CREATININE 1.36 (H) 10/25/2022 1155   GLUCOSE  110 (H) 10/25/2022 1155   CALCIUM 9.1 10/25/2022 1155   AST 22 10/14/2022 1057   ALT 24 10/14/2022 1057   ALKPHOS 304 (H) 10/14/2022 1057   BILITOT 0.8 10/14/2022 1057   PROT 6.7 10/14/2022 1057   ALBUMIN 3.3 (L) 10/14/2022 1057    RADIOGRAPHIC STUDIES: No results found.  PERFORMANCE STATUS (ECOG) : {CHL ONC ECOG ZO:[id number]}  Review of Systems Unless otherwise noted, a complete review of systems is negative.  Physical Exam General: NAD Cardiovascular: regular rate and rhythm Pulmonary: clear ant fields Abdomen: soft, nontender, + bowel sounds GU: no suprapubic tenderness Extremities: no edema, no joint deformities Skin: no rashes Neurological: Weakness but otherwise nonfocal  Assessment and Plan- Patient is a 72 y.o. female   No diagnosis found.     Patient expressed understanding and was in agreement with this plan. She also understands that She can call clinic at any time with any questions, concerns, or complaints.   Thank you for allowing me to participate in the care of this very pleasant patient.   Time Total: ***  Visit consisted of counseling and education dealing with the complex and emotionally intense issues of symptom management in the setting of serious illness.Greater than 50%  of this time was spent counseling and coordinating care related to the above assessment and plan.  Signed by: Clent Jacks, PA-C

## 2022-10-30 NOTE — Progress Notes (Signed)
Radiation Oncology Follow up Note  Name: Victoria Foley   Date:   10/30/2022 MRN:  [id number] DOB: 10/03/1950    This 72 y.o. female presents to the clinic today for 1 month follow-up status post palliative radiation therapy to her brain and patient with known stage IV adenocarcinoma of the lung.Victoria Foley  REFERRING PROVIDER: McLean-Scocuzza, French Ana *  HPI: Patient is a 72 year old female well-known to our department having been treated back in 23 for metastatic disease to her right hip and pelvis for stage IV adenocarcinoma lung.  She had been on maintenance Keytruda.  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  She also a 1 cm enhancing lesion in the posterior left temporal lobe.  She is seen today in routine follow-up is doing well she has had some blurring of vision although that is improved..  She had a recent MRI scan although that has not been interpreted yet.  She complains of some left hip pain although has a surgical pinning of that femur.  She is currently receiving tart carboplatinum and Taxol.  COMPLICATIONS OF TREATMENT: none  FOLLOW UP COMPLIANCE: keeps appointments   PHYSICAL EXAM:  BP 123/72   Pulse (!) 102   Resp 16   Ht 4\' 11"  (1.499 m)   Wt 139 lb 1.6 oz (63.1 kg)   BMI 28.09 kg/m  Well-developed well-nourished patient in NAD. HEENT reveals PERLA, EOMI, discs not visualized.  Oral cavity is clear. No oral mucosal lesions are identified. Neck is clear without evidence of cervical or supraclavicular adenopathy. Lungs are clear to A&P. Cardiac examination is essentially unremarkable with regular rate and rhythm without murmur rub or thrill. Abdomen is benign with no organomegaly or masses noted. Motor sensory and DTR levels are equal and symmetric in the upper and lower extremities. Cranial nerves II through XII are grossly intact. Proprioception is  intact. No peripheral adenopathy or edema is identified. No motor or sensory levels are noted. Crude visual fields are within normal range.  RADIOLOGY RESULTS: MRI will be reviewed when available  PLAN: Present time patient is clinically doing well without significant side effects from her palliative radiation therapy to her partial brain.  I will review her MRI scan of the brain when it becomes available.  I will turn follow-up care over to medical oncology.  Would be happy to reevaluate her for any further need for palliative radiation therapy.  Will make determinations on her other brain lesions when I review her MRI scan.  Patient and daughter both comprehend the recommendations well.  I would like to take this opportunity to thank you for allowing me to participate in the care of your patient.Carmina Miller, MD

## 2022-10-30 NOTE — Progress Notes (Signed)
Symptom Management Clinic [facility] Telephone:(336) [phone] Fax:(336) [phone]  Patient Care Team: Dana Allan, MD as PCP - General (Family Medicine) Glory Buff, RN as Oncology Nurse Navigator Michaelyn Barter, MD as Consulting Physician (Oncology)   NAME OF PATIENT: Victoria Foley  [id number]  [birth year]   DATE OF VISIT: 10/30/22  REASON FOR CONSULT: MARCO ADELSON is a 72 y.o. 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  Patient is status post RT to the right hip due to nondisplaced pathologic fracture.  She is status post palliative RT to brain.  She is on systemic chemotherapy.  INTERVAL HISTORY: Patient has had disease progression and is not a candidate for clinical trial.  Patient saw Dr. Alena Bills on 10/07/2022 and was rotated to [facility] chemotherapy.  She received cycle 1 on 10/07/2022.  Patient requested [facility] and consideration of IV fluids today for feeling of weakness postchemotherapy.    Patient was previously seen on 10/14/2022 for weakness postchemotherapy and was given IVF.  She reports that she felt much better after this.  Yesterday again she felt a bit fatigued and weak so she presents today to see if she needs additional fluids.  She reports that she has been eating and drinking like normal.  She does report that today she is feeling more back to her normal self.  Denies fever or chills.  No nausea, vomiting, diarrhea, or constipation.  Denies pain.  She does endorse numbness and tingling in her fingertips but states that it is not severe. Denies urinary complaints. Patient offers no further specific complaints today.  Wt Readings from Last 3 Encounters:  10/30/22 139 lb (63 kg)  10/30/22 139 lb 1.6 oz (63.1 kg)  10/17/22 136 lb (61.7 kg)     PAST MEDICAL HISTORY: Past Medical History:  Diagnosis Date    Anemia    Anxiety    Arthritis    COVID-19    03/2021   Hypertension    Pneumonia    Pre-diabetes    Prediabetes    Sciatica     PAST SURGICAL HISTORY:  Past Surgical History:  Procedure Laterality Date   CESAREAN SECTION     COLONOSCOPY W/ POLYPECTOMY     ECTOPIC PREGNANCY SURGERY     FEMUR SURGERY     left, s/p rod for fracture   IR IMAGING GUIDED PORT INSERTION  12/11/2021   TUBAL LIGATION      HEMATOLOGY/ONCOLOGY HISTORY:  Oncology History  Primary lung adenocarcinoma (HCC)  10/10/2021 Initial Diagnosis   Primary lung adenocarcinoma (HCC) Stage IV  Patient seen by PCP for wheezing for 2 weeks. Imaging with CXR followed by CT chest showed Spiculated 2.1 x 2.2 cm left upper lobe pulmonary nodule with associated pleural tethering. Innumerable subcentimeter pulmonary nodules throughout the lungs.      11/14/2021 PET scan   IMPRESSION: 1. Left suprahilar nodule with maximum SUV 4.5, and a more distal 2.2 cm left upper lobe nodule with maximum SUV of 5.5. 2. Innumerable scattered small pulmonary nodules, most of which are under 5 mm in diameter, throughout both lungs. Some are cavitary. Possibilities include cavitating hematogenous disseminated malignancy versus infectious etiology subset as septic emboli. Most of the nodules are stable, some are minimally enlarged compared to 10/10/2021. 3. Abnormal mild permeative type bony findings in the right hemipelvis associated with substantially accentuated metabolic activity. Possible associated pathologic fracture through the right quadrilateral  plate and right acetabulum. The findings involve the right ilium and ischium but also the right lateral sacral ala and a small focus in the left sacrum.    Pathology Results   She had transbronchial biopsies by Dr. Jayme Cloud on 11/19/21.  Pathology from LUL nodule showed adenocarcinoma, consistent with lung primary.    11/19/2021 Cancer Staging   Staging form: Lung, AJCC 8th  Edition - Clinical stage from 11/19/2021: Stage IVB (cT1c, cM1c) - Signed by Michaelyn Barter, MD on 11/27/2021 Stage prefix: Initial diagnosis   11/27/2021 Imaging   MRI pelvis  IMPRESSION: 1. Abnormal marrow signal throughout the right ilium involving the right acetabulum, right superior pubic ramus and right inferior pubic ramus, right side of the sacrum and a smaller focus of abnormal marrow lesion in the left side of the sacrum. Findings are concerning for metastatic disease. 2. Nondisplaced pathologic fracture of the quadrilateral plate of the right acetabulum. 3. Mild muscle edema in the right gluteus minimus and medius muscles likely reflecting mild muscle strain.  MRI brain  IMPRESSION: No evidence of intracranial metastatic disease.   Indeterminate 1 cm left frontal calvarium lesion.   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  MEDICATIONS:  Current Outpatient Medications  Medication Sig Dispense Refill   albuterol (VENTOLIN HFA) 108 (90 Base) MCG/ACT inhaler Inhale 2 puffs into the lungs every 6 (six) hours as needed for wheezing or shortness of breath. 18 g 0   Azelastine HCl 137 MCG/SPRAY SOLN Place 2 sprays into both nostrils 2 (two) times daily.     Cholecalciferol (VITAMIN D-3 PO) Take by  mouth.     fluticasone (FLONASE) 50 MCG/ACT nasal spray USE TWO SPRAYS IN EACH NOSTRIL ONCE DAILY prn 16 g 12   loratadine (CLARITIN) 10 MG tablet TAKE ONE TABLET BY MOUTH DAILY AS NEEDED FOR ALLERGIES 90 tablet 3   losartan (COZAAR) 100 MG tablet Take 1 tablet (100 mg total) by mouth daily. 90 tablet 3   MULTIPLE VITAMIN PO Take 1 tablet by mouth daily.     nebivolol (BYSTOLIC) 2.5 MG tablet Take 1 tablet (2.5 mg total) by mouth daily. 90 tablet 0   traMADol (ULTRAM) 50 MG tablet Take 1 tablet (50 mg total) by mouth every 6 (six) hours as needed. 60 tablet 0   dexamethasone (DECADRON) 4 MG tablet Take 1 tablet (4 mg total) by mouth daily. (Patient not taking: Reported on 10/30/2022) 20 tablet 0   No current facility-administered medications for this visit.   Facility-Administered Medications Ordered in Other Visits  Medication Dose Route Frequency Provider Last Rate Last Admin   heparin lock flush 100 UNIT/ML injection            heparin lock flush 100 unit/mL  500 Units Intravenous Once Alena Bills,  Candise Che, MD       sodium chloride flush (NS) 0.9 % injection 10 mL  10 mL Intravenous Once Michaelyn Barter, MD        VITAL SIGNS: BP 123/66 (BP Location: Left Arm, Patient Position: Sitting)   Pulse 83   Temp (!) 96 F (35.6 C)   Wt 139 lb (63 kg)   SpO2 97%   BMI 28.07 kg/m  Filed Weights   10/30/22 1135  Weight: 139 lb (63 kg)     Estimated body mass index is 28.07 kg/m as calculated from the following:   Height as of an earlier encounter on 10/30/22: 4\' 11"  (1.499 m).   Weight as of this encounter: 139 lb (63 kg).  LABS: CBC:    Component Value Date/Time   WBC 5.8 10/30/2022 1103   WBC 4.1 09/17/2022 1254   HGB 9.5 (L) 10/30/2022 1103   HCT 29.0 (L) 10/30/2022 1103   PLT 175 10/30/2022 1103   MCV 100.0 10/30/2022 1103   NEUTROABS 2.1 10/14/2022 1057   LYMPHSABS 0.8 10/14/2022 1057   MONOABS 0.3 10/14/2022 1057   EOSABS 0.1 10/14/2022 1057   BASOSABS 0.1 10/14/2022 1057    Comprehensive Metabolic Panel:    Component Value Date/Time   NA 138 10/30/2022 1103   K 3.9 10/30/2022 1103   CL 107 10/30/2022 1103   CO2 20 (L) 10/30/2022 1103   BUN 35 (H) 10/30/2022 1103   CREATININE 1.40 (H) 10/30/2022 1103   GLUCOSE 117 (H) 10/30/2022 1103   CALCIUM 9.4 10/30/2022 1103   AST 21 10/30/2022 1103   ALT 15 10/30/2022 1103   ALKPHOS 361 (H) 10/30/2022 1103   BILITOT 0.4 10/30/2022 1103   PROT 7.3 10/30/2022 1103   ALBUMIN 3.8 10/30/2022 1103   PERFORMANCE STATUS (ECOG) : 1 - Symptomatic but completely ambulatory  Review of Systems Unless otherwise noted, a complete review of systems is negative.  Physical Exam General: NAD Cardiovascular: regular rate and rhythm Pulmonary: clear ant fields Abdomen: soft, nontender, + bowel sounds GU: no suprapubic tenderness Extremities: no edema, no joint deformities Skin: no rashes Neurological: Weakness but otherwise nonfocal  IMPRESSION/PLAN: Dehydration -she is eating and drinking well.  Her weight is stable.  She is not orthostatic like she has been in the past.  Labs reviewed.  Discussed proceeding forward with IVF fluids versus oral rehydration.  At this time patient prefers oral rehydration.  She will continue following up with nutrition.   Patient expressed understanding and was in agreement with this plan. She also understands that She can call clinic at any time with any questions, concerns, or complaints.   Thank you for allowing me to participate in the care of this very pleasant patient.   Time Total: 15 minutes  Visit consisted of counseling and education dealing with the complex and emotionally intense issues of symptom management in the setting of serious illness.Greater than 50%  of this time was spent counseling and coordinating care related to the above assessment and plan.  Signed by: Clent Jacks PA-C

## 2022-10-31 ENCOUNTER — Encounter: Payer: Self-pay | Admitting: *Deleted

## 2022-10-31 DIAGNOSIS — C3490 Malignant neoplasm of unspecified part of unspecified bronchus or lung: Secondary | ICD-10-CM | POA: Insufficient documentation

## 2022-10-31 NOTE — Telephone Encounter (Signed)
I discussed this at length with the patient, her daughter, and oncology. Given the procedure would have to be timed with her chemotherapy in an effort to avoid myelosuppression, she will get her procedure last week of August with Dr. Tonia Brooms.

## 2022-11-01 MED FILL — Dexamethasone Sodium Phosphate Inj 100 MG/10ML: INTRAMUSCULAR | Qty: 1 | Status: AC

## 2022-11-04 ENCOUNTER — Inpatient Hospital Stay: Payer: PPO

## 2022-11-04 ENCOUNTER — Encounter: Payer: Self-pay | Admitting: *Deleted

## 2022-11-04 ENCOUNTER — Inpatient Hospital Stay (HOSPITAL_BASED_OUTPATIENT_CLINIC_OR_DEPARTMENT_OTHER): Payer: PPO | Admitting: Internal Medicine

## 2022-11-04 ENCOUNTER — Encounter: Payer: Self-pay | Admitting: Internal Medicine

## 2022-11-04 VITALS — BP 117/78 | HR 81 | Temp 97.0°F | Resp 17 | Wt 138.0 lb

## 2022-11-04 DIAGNOSIS — Z5111 Encounter for antineoplastic chemotherapy: Secondary | ICD-10-CM

## 2022-11-04 DIAGNOSIS — C3490 Malignant neoplasm of unspecified part of unspecified bronchus or lung: Secondary | ICD-10-CM | POA: Diagnosis not present

## 2022-11-04 DIAGNOSIS — T451X5A Adverse effect of antineoplastic and immunosuppressive drugs, initial encounter: Secondary | ICD-10-CM

## 2022-11-04 DIAGNOSIS — D6481 Anemia due to antineoplastic chemotherapy: Secondary | ICD-10-CM | POA: Diagnosis not present

## 2022-11-04 DIAGNOSIS — C7931 Secondary malignant neoplasm of brain: Secondary | ICD-10-CM | POA: Diagnosis not present

## 2022-11-04 LAB — CBC WITH DIFFERENTIAL (CANCER CENTER ONLY)
Abs Immature Granulocytes: 0.04 10*3/uL (ref 0.00–0.07)
Basophils Absolute: 0 10*3/uL (ref 0.0–0.1)
Basophils Relative: 1 %
Eosinophils Absolute: 0.1 10*3/uL (ref 0.0–0.5)
Eosinophils Relative: 2 %
HCT: 26.7 % — ABNORMAL LOW (ref 36.0–46.0)
Hemoglobin: 8.7 g/dL — ABNORMAL LOW (ref 12.0–15.0)
Immature Granulocytes: 1 %
Lymphocytes Relative: 18 %
Lymphs Abs: 1 10*3/uL (ref 0.7–4.0)
MCH: 32.7 pg (ref 26.0–34.0)
MCHC: 32.6 g/dL (ref 30.0–36.0)
MCV: 100.4 fL — ABNORMAL HIGH (ref 80.0–100.0)
Monocytes Absolute: 0.5 10*3/uL (ref 0.1–1.0)
Monocytes Relative: 9 %
Neutro Abs: 4 10*3/uL (ref 1.7–7.7)
Neutrophils Relative %: 69 %
Platelet Count: 235 10*3/uL (ref 150–400)
RBC: 2.66 MIL/uL — ABNORMAL LOW (ref 3.87–5.11)
RDW: 15.8 % — ABNORMAL HIGH (ref 11.5–15.5)
WBC Count: 5.7 10*3/uL (ref 4.0–10.5)
nRBC: 0 % (ref 0.0–0.2)

## 2022-11-04 LAB — CMP (CANCER CENTER ONLY)
ALT: 14 U/L (ref 0–44)
AST: 21 U/L (ref 15–41)
Albumin: 3.5 g/dL (ref 3.5–5.0)
Alkaline Phosphatase: 343 U/L — ABNORMAL HIGH (ref 38–126)
Anion gap: 7 (ref 5–15)
BUN: 34 mg/dL — ABNORMAL HIGH (ref 8–23)
CO2: 19 mmol/L — ABNORMAL LOW (ref 22–32)
Calcium: 9.1 mg/dL (ref 8.9–10.3)
Chloride: 111 mmol/L (ref 98–111)
Creatinine: 1.59 mg/dL — ABNORMAL HIGH (ref 0.44–1.00)
GFR, Estimated: 35 mL/min — ABNORMAL LOW (ref >60)
Glucose, Bld: 112 mg/dL — ABNORMAL HIGH (ref 70–99)
Potassium: 4 mmol/L (ref 3.5–5.1)
Sodium: 137 mmol/L (ref 135–145)
Total Bilirubin: 0.5 mg/dL (ref 0.3–1.2)
Total Protein: 6.8 g/dL (ref 6.5–8.1)

## 2022-11-04 MED ORDER — SODIUM CHLORIDE 0.9 % IV SOLN
232.0000 mg | Freq: Once | INTRAVENOUS | Status: AC
  Administered 2022-11-04: 230 mg via INTRAVENOUS
  Filled 2022-11-04: qty 23

## 2022-11-04 MED ORDER — FAMOTIDINE IN NACL 20-0.9 MG/50ML-% IV SOLN
20.0000 mg | Freq: Once | INTRAVENOUS | Status: AC
  Administered 2022-11-04: 20 mg via INTRAVENOUS
  Filled 2022-11-04: qty 50

## 2022-11-04 MED ORDER — SODIUM CHLORIDE 0.9 % IV SOLN
150.0000 mg | Freq: Once | INTRAVENOUS | Status: AC
  Administered 2022-11-04: 150 mg via INTRAVENOUS
  Filled 2022-11-04: qty 150

## 2022-11-04 MED ORDER — SODIUM CHLORIDE 0.9 % IV SOLN
150.0000 mg/m2 | Freq: Once | INTRAVENOUS | Status: AC
  Administered 2022-11-04: 246 mg via INTRAVENOUS
  Filled 2022-11-04: qty 41

## 2022-11-04 MED ORDER — PEGFILGRASTIM 6 MG/0.6ML ~~LOC~~ PSKT
6.0000 mg | PREFILLED_SYRINGE | Freq: Once | SUBCUTANEOUS | Status: AC
  Administered 2022-11-04: 6 mg via SUBCUTANEOUS
  Filled 2022-11-04: qty 0.6

## 2022-11-04 MED ORDER — DARBEPOETIN ALFA 300 MCG/0.6ML IJ SOSY
300.0000 ug | PREFILLED_SYRINGE | Freq: Once | INTRAMUSCULAR | Status: AC
  Administered 2022-11-04: 300 ug via SUBCUTANEOUS
  Filled 2022-11-04: qty 0.6

## 2022-11-04 MED ORDER — DIPHENHYDRAMINE HCL 50 MG/ML IJ SOLN
50.0000 mg | Freq: Once | INTRAMUSCULAR | Status: AC
  Administered 2022-11-04: 50 mg via INTRAVENOUS
  Filled 2022-11-04: qty 1

## 2022-11-04 MED ORDER — SODIUM CHLORIDE 0.9 % IV SOLN
10.0000 mg | Freq: Once | INTRAVENOUS | Status: AC
  Administered 2022-11-04: 10 mg via INTRAVENOUS
  Filled 2022-11-04: qty 10

## 2022-11-04 MED ORDER — SODIUM CHLORIDE 0.9 % IV SOLN
Freq: Once | INTRAVENOUS | Status: AC
  Filled 2022-11-04: qty 250

## 2022-11-04 MED ORDER — HEPARIN SOD (PORK) LOCK FLUSH 100 UNIT/ML IV SOLN
INTRAVENOUS | Status: AC
  Administered 2022-11-04: 500 [IU]
  Filled 2022-11-04: qty 5

## 2022-11-04 MED ORDER — PALONOSETRON HCL INJECTION 0.25 MG/5ML
0.2500 mg | Freq: Once | INTRAVENOUS | Status: AC
  Administered 2022-11-04: 0.25 mg via INTRAVENOUS
  Filled 2022-11-04: qty 5

## 2022-11-04 NOTE — Patient Instructions (Signed)
[place] [facility] AT [facility] REGIONAL  Discharge Instructions: Thank you for choosing [facility] to provide your oncology and hematology care.  If you have a lab appointment with the Cancer Center, please go directly to the Cancer Center and check in at the registration area.  Wear comfortable clothing and clothing appropriate for easy access to any Portacath or PICC line.   We strive to give you quality time with your provider. You may need to reschedule your appointment if you arrive late (15 or more minutes).  Arriving late affects you and other patients whose appointments are after yours.  Also, if you miss three or more appointments without notifying the office, you may be dismissed from the clinic at the provider's discretion.      For prescription refill requests, have your pharmacy contact our office and allow 72 hours for refills to be completed.    Today you received the following chemotherapy and/or immunotherapy agents Carboplatin, taxol      To help prevent nausea and vomiting after your treatment, we encourage you to take your nausea medication as directed.  BELOW ARE SYMPTOMS THAT SHOULD BE REPORTED IMMEDIATELY: *FEVER GREATER THAN 100.4 F (38 C) OR HIGHER *CHILLS OR SWEATING *NAUSEA AND VOMITING THAT IS NOT CONTROLLED WITH YOUR NAUSEA MEDICATION *UNUSUAL SHORTNESS OF BREATH *UNUSUAL BRUISING OR BLEEDING *URINARY PROBLEMS (pain or burning when urinating, or frequent urination) *BOWEL PROBLEMS (unusual diarrhea, constipation, pain near the anus) TENDERNESS IN MOUTH AND THROAT WITH OR WITHOUT PRESENCE OF ULCERS (sore throat, sores in mouth, or a toothache) UNUSUAL RASH, SWELLING OR PAIN  UNUSUAL VAGINAL DISCHARGE OR ITCHING   Items with * indicate a potential emergency and should be followed up as soon as possible or go to the Emergency Department if any problems should occur.  Please show the CHEMOTHERAPY ALERT CARD or IMMUNOTHERAPY ALERT CARD at  check-in to the Emergency Department and triage nurse.  Should you have questions after your visit or need to cancel or reschedule your appointment, please contact [place] [facility] AT [facility]  [phone] and follow the prompts.  Office hours are 8:00 a.m. to 4:30 p.m. Monday - Friday. Please note that voicemails left after 4:00 p.m. may not be returned until the following business day.  We are closed weekends and major holidays. You have access to a nurse at all times for urgent questions. Please call the main number to the clinic [phone] and follow the prompts.  For any non-urgent questions, you may also contact your provider using MyChart. We now offer e-Visits for anyone 96 and older to request care online for non-urgent symptoms. For details visit [email or web address].   Also download the MyChart app! Go to the app store, search "MyChart", open the app, select [place], and log in with your MyChart username and password.

## 2022-11-05 ENCOUNTER — Encounter: Payer: Self-pay | Admitting: Internal Medicine

## 2022-11-05 NOTE — Progress Notes (Signed)
[facility] OFFICE PROGRESS NOTE  Patient Care Team: Dana Allan, MD as PCP - General (Family Medicine) Glory Buff, RN as Oncology Nurse Navigator Michaelyn Barter, MD as Consulting Physician (Oncology)  TREATMENT:  Right hip palliative RT 30 cGy completed 12/18/2021 Carboplatin, alimta and Keytruda x 4 cycles completed 02/19/22.  Discontinued maintenance alimta (04-23-22) due to worsening renal dysfunction, transaminitis and cytopenias Keytruda maintenance-discontinued on 09/17/2022 due to disease progression Palliative RT to left frontal lesion 5 fx completed 09/27/22 Carboplatin (AUC 4) and paclitaxel 150 mg/m2 - started 10/07/2022   ASSESSMENT & PLAN:  # Primary lung adenocarcinoma (HCC), Stage IV, PDL1 1% -Treated with 4 cycles of carboplatin and Alimta then on maintenance Keytruda until May 2024.  Initial Foundation 1 testing showed PD-L1 1%.  No other targetable mutations.  Report below  -discontinued maintenance Alimta on 04/23/2022 due to worsening renal function, hematologic toxicity requiring multiple blood transfusions and elevated LFTs despite dose reductions.   -Restaging CT chest abdomen pelvis without contrast (09/10/2022)-marked interval disease progression in lungs, bone and also brain.  Had second opinion with Dr. Kemper Durie at [facility].  No clinical trials available due to brain mets progression.  Liquid foundation 1 testing did not show any targetable mutations.  Tissue biopsy (recommended by [facility]) from lung was initially scheduled on 7/11 but canceled due to some scheduling conflict.  Now rescheduled end of August.  Decided to hold off on bone biopsy after discussion with pathology and foundation 1 as it can affect the results with use of decalcifying agent.  -Started on second line carbo AUC 4 and Taxol 150 mg/m on 10/07/2022.  Here for cycle 2.  Labs reviewed. Hemoglobin 8.7 noted.  Will add Aranesp 300 mcg.  Platelet 235 and WBC 5.7.  Creatinine 1.59 overall stable.   I will proceed with cycle 2 today.  She is having issues with fluid intake on subsequent week.  Will schedule for labs and IV fluid on week 1 and week 2.  Plan to repeat CT imaging after 3 cycles.  # Secondary metastasis to brain -MRI brain with and without contrast (09/10/2022) showed interval mild progression of left frontal calvarium lesion with extension of tumor to involve subjacent dura measuring 2.8 x 2 cm.  Suspect slight/early extension into superior left orbit as well as extension of bony edema to involve left sphenoid wing with probable slight involvement of adjacent dura in the region.  New 1 cm enhancing lesion along the posterior left temporal lobe along the falx.  -Completed palliative RT to left frontal lesion 5 fractions on 09/27/2022.  Repeat MRI from 10/25/2022 showed slight increase in the left frontal calvarium to 3.1 cm and thickened lining could be reactive versus progression.  1 cm lesion in the posterior temporal lobe is stable.  Will continue to closely monitor.  I will also refer her to Dr. Barbaraann Cao neuro-oncology considering leptomeningeal involvement.  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 Cr stabilized.  - will continue to closely monitor.  # Chemotherapy-induced anemia  -Will monitor while on chemotherapy.   -Will proceed with Aranesp 300 mcg with every chemo cycle.  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 Aggie Cosier on 12/18/2021 -Was seen by [facility].  No surgical intervention needed.  # Access - port  RTC in 3 weeks for MD visit, labs, cycle 3 carboplatin and Taxol.  Orders Placed This Encounter  Procedures   CBC with Differential (Cancer Center Only)  Standing Status:   Future    Standing Expiration Date:   11/25/2023   CMP (Cancer Center only)    Standing Status:   Future    Standing Expiration Date:   11/25/2023   CBC with Differential/Platelet    Standing Status:   Future    Standing Expiration Date:    11/04/2023   Comprehensive metabolic panel    Standing Status:   Future    Standing Expiration Date:   11/04/2023   CBC with Differential/Platelet    Standing Status:   Future    Standing Expiration Date:   11/04/2023   Comprehensive metabolic panel    Standing Status:   Future    Standing Expiration Date:   11/04/2023   Amb Referral to Neuro Oncology    Referral Priority:   Routine    Referral Type:   Consultation    Referral Reason:   Specialty Services Required    Number of Visits Requested:   1   Hold Tube- Blood Bank    Standing Status:   Future    Standing Expiration Date:   11/04/2023   Hold Tube- Blood Bank    Standing Status:   Future    Standing Expiration Date:   11/04/2023    [facility]- 12/06/2021   All questions were answered. The patient knows to call the clinic with any problems, questions or concerns. The total time spent in the appointment was 30 minutes encounter with patients including review of chart and various tests results, discussions about plan of care and coordination of care plan   Michaelyn Barter, MD 11/05/2022 3:37 PM  INTERVAL HISTORY: Please see below for problem oriented charting.  Patient following with [place] for treatment of stage IV lung adenocarcinoma.   Patient was seen today accompanied with daughter prior to cycle 2 of [place] and Taxol. Patient was in the wheelchair today due to pain from the left hip.  She has ongoing arthritic issues and is following with orthopedics.  Reports her headache has improved.  Vision changes have also improved.  Was evaluated by ophthalmologist.  Was told she has cataract.  Denies any neuropathy.  Denies nausea vomiting.  REVIEW OF SYSTEMS:   Positive ROS as above.  Rest 10 points ROS negative   I have reviewed the past medical history, past surgical history, social history and family history with the patient and they are unchanged from previous note.  ALLERGIES:  is allergic to diclofenac, lisinopril, and sulfa  antibiotics.  MEDICATIONS:  Current Outpatient Medications  Medication Sig Dispense Refill   albuterol (VENTOLIN HFA) 108 (90 Base) MCG/ACT inhaler Inhale 2 puffs into the lungs every 6 (six) hours as needed for wheezing or shortness of breath. 18 g 0   Azelastine HCl 137 MCG/SPRAY SOLN Place 2 sprays into both nostrils 2 (two) times daily.     Cholecalciferol (VITAMIN D-3 PO) Take by mouth.     fluticasone (FLONASE) 50 MCG/ACT nasal spray USE TWO SPRAYS IN EACH NOSTRIL ONCE DAILY prn 16 g 12   loratadine (CLARITIN) 10 MG tablet TAKE ONE TABLET BY MOUTH DAILY AS NEEDED FOR ALLERGIES 90 tablet 3   losartan (COZAAR) 100 MG tablet Take 1 tablet (100 mg total) by mouth daily. 90 tablet 3   MULTIPLE VITAMIN PO Take 1 tablet by mouth daily.     nebivolol (BYSTOLIC) 2.5 MG tablet Take 1 tablet (2.5 mg total) by mouth daily. 90 tablet 0   traMADol (ULTRAM) 50 MG tablet Take 1  tablet (50 mg total) by mouth every 6 (six) hours as needed. 60 tablet 0   dexamethasone (DECADRON) 4 MG tablet Take 1 tablet (4 mg total) by mouth daily. (Patient not taking: Reported on 10/30/2022) 20 tablet 0   No current facility-administered medications for this visit.   Facility-Administered Medications Ordered in Other Visits  Medication Dose Route Frequency Provider Last Rate Last Admin   heparin lock flush 100 UNIT/ML injection            heparin lock flush 100 unit/mL  500 Units Intravenous Once Michaelyn Barter, MD       sodium chloride flush (NS) 0.9 % injection 10 mL  10 mL Intravenous Once Michaelyn Barter, MD        SUMMARY OF ONCOLOGIC HISTORY: Oncology History  Primary lung adenocarcinoma (HCC)  10/10/2021 Initial Diagnosis   Primary lung adenocarcinoma (HCC) Stage IV  Patient seen by PCP for wheezing for 2 weeks. Imaging with CXR followed by CT chest showed Spiculated 2.1 x 2.2 cm left upper lobe pulmonary nodule with associated pleural tethering. Innumerable subcentimeter pulmonary nodules throughout the  lungs.      11/14/2021 PET scan   IMPRESSION: 1. Left suprahilar nodule with maximum SUV 4.5, and a more distal 2.2 cm left upper lobe nodule with maximum SUV of 5.5. 2. Innumerable scattered small pulmonary nodules, most of which are under 5 mm in diameter, throughout both lungs. Some are cavitary. Possibilities include cavitating hematogenous disseminated malignancy versus infectious etiology subset as septic emboli. Most of the nodules are stable, some are minimally enlarged compared to 10/10/2021. 3. Abnormal mild permeative type bony findings in the right hemipelvis associated with substantially accentuated metabolic activity. Possible associated pathologic fracture through the right quadrilateral plate and right acetabulum. The findings involve the right ilium and ischium but also the right lateral sacral ala and a small focus in the left sacrum.    Pathology Results   She had transbronchial biopsies by Dr. Jayme Cloud on 11/19/21.  Pathology from LUL nodule showed adenocarcinoma, consistent with lung primary.    11/19/2021 Cancer Staging   Staging form: Lung, AJCC 8th Edition - Clinical stage from 11/19/2021: Stage IVB (cT1c, cM1c) - Signed by Michaelyn Barter, MD on 11/27/2021 Stage prefix: Initial diagnosis   11/27/2021 Imaging   MRI pelvis  IMPRESSION: 1. Abnormal marrow signal throughout the right ilium involving the right acetabulum, right superior pubic ramus and right inferior pubic ramus, right side of the sacrum and a smaller focus of abnormal marrow lesion in the left side of the sacrum. Findings are concerning for metastatic disease. 2. Nondisplaced pathologic fracture of the quadrilateral plate of the right acetabulum. 3. Mild muscle edema in the right gluteus minimus and medius muscles likely reflecting mild muscle strain.  MRI brain  IMPRESSION: No evidence of intracranial metastatic disease.   Indeterminate 1 cm left frontal calvarium lesion.   12/18/2021 -  08/27/2022 Chemotherapy   Patient is on Treatment Plan : LUNG Carboplatin (5) + Pemetrexed (500) + Pembrolizumab (200) D1 q21d Induction x 4 cycles / Maintenance Pemetrexed (500) + Pembrolizumab (200) D1 q21d     10/07/2022 -  Chemotherapy   Patient is on Treatment Plan : LUNG Carboplatin + Paclitaxel q21d Dose Reduction     Primary malignant neoplasm of left upper lobe of lung (HCC)  06/04/2022 Initial Diagnosis   Primary malignant neoplasm of left upper lobe of lung (HCC)   06/04/2022 Cancer Staging   Staging form: Lung, AJCC 8th Edition - Pathologic:  Stage IVB (pT1c, pNX, cM1c) - Signed by Earna Coder, MD on 06/04/2022     PHYSICAL EXAMINATION: ECOG PERFORMANCE STATUS: 2 - Symptomatic, <50% confined to bed  Vitals:   11/04/22 0859  BP: 117/78  Pulse: 81  Resp: 17  Temp: (!) 97 F (36.1 C)  SpO2: 100%    Filed Weights   11/04/22 0859  Weight: 138 lb (62.6 kg)     Physical Exam Constitutional:      Appearance: Normal appearance.  HENT:     Head: Normocephalic and atraumatic.  Cardiovascular:     Rate and Rhythm: Normal rate.  Pulmonary:     Effort: Pulmonary effort is normal.  Musculoskeletal:     Right lower leg: No edema.     Left lower leg: No edema.  Skin:    General: Skin is warm.  Neurological:     General: No focal deficit present.     Mental Status: She is oriented to person, place, and time.  Psychiatric:        Mood and Affect: Mood normal.     LABORATORY DATA:  I have reviewed the data as listed    Component Value Date/Time   NA 137 11/04/2022 0831   K 4.0 11/04/2022 0831   CL 111 11/04/2022 0831   CO2 19 (L) 11/04/2022 0831   GLUCOSE 112 (H) 11/04/2022 0831   BUN 34 (H) 11/04/2022 0831   CREATININE 1.59 (H) 11/04/2022 0831   CALCIUM 9.1 11/04/2022 0831   PROT 6.8 11/04/2022 0831   ALBUMIN 3.5 11/04/2022 0831   AST 21 11/04/2022 0831   ALT 14 11/04/2022 0831   ALKPHOS 343 (H) 11/04/2022 0831   BILITOT 0.5 11/04/2022 0831    GFRNONAA 35 (L) 11/04/2022 0831    No results found for: "SPEP", "UPEP"  Lab Results  Component Value Date   WBC 5.7 11/04/2022   NEUTROABS 4.0 11/04/2022   HGB 8.7 (L) 11/04/2022   HCT 26.7 (L) 11/04/2022   MCV 100.4 (H) 11/04/2022   PLT 235 11/04/2022      Chemistry      Component Value Date/Time   NA 137 11/04/2022 0831   K 4.0 11/04/2022 0831   CL 111 11/04/2022 0831   CO2 19 (L) 11/04/2022 0831   BUN 34 (H) 11/04/2022 0831   CREATININE 1.59 (H) 11/04/2022 0831      Component Value Date/Time   CALCIUM 9.1 11/04/2022 0831   ALKPHOS 343 (H) 11/04/2022 0831   AST 21 11/04/2022 0831   ALT 14 11/04/2022 0831   BILITOT 0.5 11/04/2022 0831       RADIOGRAPHIC STUDIES: I have personally reviewed the radiological images as listed and agreed with the findings in the report. MR Brain W Wo Contrast  Result Date: 11/02/2022 CLINICAL DATA:  follow up brain mets EXAM: MRI HEAD WITHOUT AND WITH CONTRAST TECHNIQUE: Multiplanar, multiecho pulse sequences of the brain and surrounding structures were obtained without and with intravenous contrast. CONTRAST:  6mL GADAVIST GADOBUTROL 1 MMOL/ML IV SOLN COMPARISON:  None Available. FINDINGS: Brain: Similar enhancing intraparenchymal metastasis measuring approximately 1 cm in the posterior left temporal lobe (series 1024, image 108). Additional smaller osseous metastatic disease in the more posterior left calvarium is similar. Redemonstrated extension of enhancing tumor to involve the left sphenoid. Bulky left frontal necrotic osseous calvarial metastasis measures slightly increased in size (3.1 by 1.9 cm, previously 2.8 x 1.9 cm). New faint wispy enhancement in adjacent left frontal sulci. Also, increased conspicuity and extent  of left frontal dural thickening and enhancement. No evidence of acute infarct, acute hemorrhage, midline shift or hydrocephalus. Vascular: Major arterial flow voids are maintained. Skull and upper cervical spine: Osseous  metastatic disease as detailed above. Sinuses/Orbits: Similar suspected extension of osseous metastatic disease into the superior left orbit (for example series 12, image 36) without mass effect. Clear sinuses. IMPRESSION: 1. Similar 1 cm left posterior temporal lobe intraparenchymal metastasis. 2. Bulky left frontal necrotic osseous calvarial metastasis measures slightly increased in size (3.1 by 1.9 cm, previously 2.8 x 1.9 cm). Redemonstrated intracranial extension with increased extent of left frontal dural thickening and new faint wispy enhancement in adjacent sulci, which could represent reactive change and/or progressive leptomeningeal disease. 3. Similar suspected extension of osseous metastatic disease into the superior left orbit without significant orbital mass effect. Electronically Signed   By: Feliberto Harts M.D.   On: 11/02/2022 14:20

## 2022-11-11 ENCOUNTER — Inpatient Hospital Stay: Payer: PPO

## 2022-11-11 VITALS — BP 115/74 | HR 109 | Temp 98.6°F | Resp 20

## 2022-11-11 DIAGNOSIS — Z5111 Encounter for antineoplastic chemotherapy: Secondary | ICD-10-CM | POA: Diagnosis not present

## 2022-11-11 DIAGNOSIS — C3490 Malignant neoplasm of unspecified part of unspecified bronchus or lung: Secondary | ICD-10-CM

## 2022-11-11 LAB — COMPREHENSIVE METABOLIC PANEL
ALT: 18 U/L (ref 0–44)
AST: 19 U/L (ref 15–41)
Albumin: 3.3 g/dL — ABNORMAL LOW (ref 3.5–5.0)
Alkaline Phosphatase: 238 U/L — ABNORMAL HIGH (ref 38–126)
Anion gap: 11 (ref 5–15)
BUN: 28 mg/dL — ABNORMAL HIGH (ref 8–23)
CO2: 18 mmol/L — ABNORMAL LOW (ref 22–32)
Calcium: 8.9 mg/dL (ref 8.9–10.3)
Chloride: 106 mmol/L (ref 98–111)
Creatinine, Ser: 0.56 mg/dL (ref 0.44–1.00)
GFR, Estimated: >60 mL/min (ref >60)
Glucose, Bld: 129 mg/dL — ABNORMAL HIGH (ref 70–99)
Potassium: 4 mmol/L (ref 3.5–5.1)
Sodium: 135 mmol/L (ref 135–145)
Total Bilirubin: 0.5 mg/dL (ref 0.3–1.2)
Total Protein: 5.8 g/dL — ABNORMAL LOW (ref 6.5–8.1)

## 2022-11-11 LAB — CBC WITH DIFFERENTIAL/PLATELET
Abs Immature Granulocytes: 1.59 10*3/uL — ABNORMAL HIGH (ref 0.00–0.07)
Basophils Absolute: 0 10*3/uL (ref 0.0–0.1)
Basophils Relative: 0 %
Eosinophils Absolute: 0.2 10*3/uL (ref 0.0–0.5)
Eosinophils Relative: 1 %
HCT: 27.8 % — ABNORMAL LOW (ref 36.0–46.0)
Hemoglobin: 9 g/dL — ABNORMAL LOW (ref 12.0–15.0)
Immature Granulocytes: 14 %
Lymphocytes Relative: 11 %
Lymphs Abs: 1.3 10*3/uL (ref 0.7–4.0)
MCH: 32.8 pg (ref 26.0–34.0)
MCHC: 32.4 g/dL (ref 30.0–36.0)
MCV: 101.5 fL — ABNORMAL HIGH (ref 80.0–100.0)
Monocytes Absolute: 1.4 10*3/uL — ABNORMAL HIGH (ref 0.1–1.0)
Monocytes Relative: 12 %
Neutro Abs: 7.2 10*3/uL (ref 1.7–7.7)
Neutrophils Relative %: 62 %
Platelets: 152 10*3/uL (ref 150–400)
RBC: 2.74 MIL/uL — ABNORMAL LOW (ref 3.87–5.11)
RDW: 15.6 % — ABNORMAL HIGH (ref 11.5–15.5)
Smear Review: NORMAL
WBC: 11.6 10*3/uL — ABNORMAL HIGH (ref 4.0–10.5)
nRBC: 0 % (ref 0.0–0.2)

## 2022-11-11 LAB — SAMPLE TO BLOOD BANK

## 2022-11-11 MED ORDER — SODIUM CHLORIDE 0.9 % IV SOLN
Freq: Once | INTRAVENOUS | Status: AC
  Filled 2022-11-11: qty 250

## 2022-11-11 MED ORDER — SODIUM CHLORIDE 0.9% FLUSH
10.0000 mL | Freq: Once | INTRAVENOUS | Status: AC | PRN
  Administered 2022-11-11: 10 mL
  Filled 2022-11-11: qty 10

## 2022-11-11 MED ORDER — HEPARIN SOD (PORK) LOCK FLUSH 100 UNIT/ML IV SOLN
500.0000 [IU] | Freq: Once | INTRAVENOUS | Status: AC | PRN
  Administered 2022-11-11: 500 [IU]
  Filled 2022-11-11: qty 5

## 2022-11-11 MED ORDER — ONDANSETRON HCL 4 MG/2ML IJ SOLN
4.0000 mg | Freq: Once | INTRAMUSCULAR | Status: AC
  Administered 2022-11-11: 4 mg via INTRAVENOUS
  Filled 2022-11-11: qty 2

## 2022-11-18 ENCOUNTER — Inpatient Hospital Stay: Payer: PPO

## 2022-11-18 ENCOUNTER — Other Ambulatory Visit: Payer: Self-pay

## 2022-11-18 ENCOUNTER — Inpatient Hospital Stay (HOSPITAL_BASED_OUTPATIENT_CLINIC_OR_DEPARTMENT_OTHER): Payer: PPO | Admitting: Nurse Practitioner

## 2022-11-18 ENCOUNTER — Encounter: Payer: Self-pay | Admitting: Nurse Practitioner

## 2022-11-18 VITALS — BP 123/81 | HR 84 | Temp 97.3°F | Wt 129.0 lb

## 2022-11-18 DIAGNOSIS — D649 Anemia, unspecified: Secondary | ICD-10-CM

## 2022-11-18 DIAGNOSIS — Z09 Encounter for follow-up examination after completed treatment for conditions other than malignant neoplasm: Secondary | ICD-10-CM

## 2022-11-18 DIAGNOSIS — Z5111 Encounter for antineoplastic chemotherapy: Secondary | ICD-10-CM | POA: Diagnosis not present

## 2022-11-18 DIAGNOSIS — R748 Abnormal levels of other serum enzymes: Secondary | ICD-10-CM | POA: Diagnosis not present

## 2022-11-18 DIAGNOSIS — C3490 Malignant neoplasm of unspecified part of unspecified bronchus or lung: Secondary | ICD-10-CM

## 2022-11-18 LAB — COMPREHENSIVE METABOLIC PANEL
ALT: 17 U/L (ref 0–44)
AST: 18 U/L (ref 15–41)
Albumin: 3.4 g/dL — ABNORMAL LOW (ref 3.5–5.0)
Alkaline Phosphatase: 343 U/L — ABNORMAL HIGH (ref 38–126)
Anion gap: 5 (ref 5–15)
BUN: 20 mg/dL (ref 8–23)
CO2: 24 mmol/L (ref 22–32)
Calcium: 8.9 mg/dL (ref 8.9–10.3)
Chloride: 111 mmol/L (ref 98–111)
Creatinine, Ser: 1.33 mg/dL — ABNORMAL HIGH (ref 0.44–1.00)
GFR, Estimated: 43 mL/min — ABNORMAL LOW (ref >60)
Glucose, Bld: 107 mg/dL — ABNORMAL HIGH (ref 70–99)
Potassium: 4.2 mmol/L (ref 3.5–5.1)
Sodium: 140 mmol/L (ref 135–145)
Total Bilirubin: 0.3 mg/dL (ref 0.3–1.2)
Total Protein: 6.5 g/dL (ref 6.5–8.1)

## 2022-11-18 LAB — CBC WITH DIFFERENTIAL/PLATELET
Abs Immature Granulocytes: 0.13 10*3/uL — ABNORMAL HIGH (ref 0.00–0.07)
Basophils Absolute: 0 10*3/uL (ref 0.0–0.1)
Basophils Relative: 1 %
Eosinophils Absolute: 0 10*3/uL (ref 0.0–0.5)
Eosinophils Relative: 0 %
HCT: 27 % — ABNORMAL LOW (ref 36.0–46.0)
Hemoglobin: 8.4 g/dL — ABNORMAL LOW (ref 12.0–15.0)
Immature Granulocytes: 2 %
Lymphocytes Relative: 15 %
Lymphs Abs: 1.2 10*3/uL (ref 0.7–4.0)
MCH: 32.8 pg (ref 26.0–34.0)
MCHC: 31.1 g/dL (ref 30.0–36.0)
MCV: 105.5 fL — ABNORMAL HIGH (ref 80.0–100.0)
Monocytes Absolute: 0.4 10*3/uL (ref 0.1–1.0)
Monocytes Relative: 5 %
Neutro Abs: 6 10*3/uL (ref 1.7–7.7)
Neutrophils Relative %: 77 %
Platelets: 186 10*3/uL (ref 150–400)
RBC: 2.56 MIL/uL — ABNORMAL LOW (ref 3.87–5.11)
RDW: 17.5 % — ABNORMAL HIGH (ref 11.5–15.5)
WBC: 7.7 10*3/uL (ref 4.0–10.5)
nRBC: 0.5 % — ABNORMAL HIGH (ref 0.0–0.2)

## 2022-11-18 LAB — IRON AND TIBC
Iron: 116 ug/dL (ref 28–170)
Saturation Ratios: 43 % — ABNORMAL HIGH (ref 10.4–31.8)
TIBC: 267 ug/dL (ref 250–450)
UIBC: 151 ug/dL

## 2022-11-18 LAB — FERRITIN: Ferritin: 1155 ng/mL — ABNORMAL HIGH (ref 11–307)

## 2022-11-18 LAB — SAMPLE TO BLOOD BANK

## 2022-11-18 LAB — VITAMIN B12: Vitamin B-12: 4823 pg/mL — ABNORMAL HIGH (ref 180–914)

## 2022-11-18 LAB — FOLATE: Folate: 7.5 ng/mL (ref >5.9)

## 2022-11-18 MED ORDER — SODIUM CHLORIDE 0.9% FLUSH
10.0000 mL | Freq: Once | INTRAVENOUS | Status: AC | PRN
  Administered 2022-11-18: 10 mL
  Filled 2022-11-18: qty 10

## 2022-11-18 MED ORDER — SODIUM CHLORIDE 0.9 % IV SOLN
Freq: Once | INTRAVENOUS | Status: AC
  Filled 2022-11-18: qty 250

## 2022-11-18 MED ORDER — HEPARIN SOD (PORK) LOCK FLUSH 100 UNIT/ML IV SOLN
500.0000 [IU] | Freq: Once | INTRAVENOUS | Status: AC | PRN
  Administered 2022-11-18: 500 [IU]
  Filled 2022-11-18: qty 5

## 2022-11-18 NOTE — Progress Notes (Signed)
V/o from Consuello Masse, NP to change fluid rate to 999 ml/hour

## 2022-11-18 NOTE — Progress Notes (Unsigned)
[facility] OFFICE PROGRESS NOTE  Patient Care Team: Dana Allan, MD as PCP - General (Family Medicine) Glory Buff, RN as Oncology Nurse Navigator Michaelyn Barter, MD as Consulting Physician (Oncology)  TREATMENT:  Right hip palliative RT 30 cGy completed 12/18/2021 Carboplatin, alimta and Keytruda x 4 cycles completed 02/19/22.  Discontinued maintenance alimta (04-23-22) due to worsening renal dysfunction, transaminitis and cytopenias Keytruda maintenance-discontinued on 09/17/2022 due to disease progression Palliative RT to left frontal lesion 5 fx completed 09/27/22 Carboplatin (AUC 4) and paclitaxel 150 mg/m2 - started 10/07/2022   ASSESSMENT & PLAN:  # Primary lung adenocarcinoma (HCC), Stage IV, PDL1 1% -Treated with 4 cycles of carboplatin and Alimta then on maintenance Keytruda until May 2024.  Initial Foundation 1 testing showed PD-L1 1%.  No other targetable mutations.    -discontinued maintenance Alimta on 04/23/2022 due to worsening renal function, hematologic toxicity requiring multiple blood transfusions and elevated LFTs despite dose reductions.   -Restaging CT chest abdomen pelvis without contrast (09/10/2022)-marked interval disease progression in lungs, bone and also brain.  Had second opinion with Dr. Kemper Durie at [facility]  No clinical trials available due to brain mets progression.  Liquid foundation 1 testing did not show any targetable mutations.  Tissue biopsy (recommended by [facility]) from lung was initially scheduled on 7/11 but canceled due to some scheduling conflict.  Now rescheduled end of August.  Decided to hold off on bone biopsy after discussion with pathology and foundation 1 as it can affect the results with use of decalcifying agent.  -Started on second line carbo AUC 4 and Taxol 150 mg/m on 10/07/2022.  S/p cycle 2 on 11/04/22.   - Anemia d/t chemotherapy - Aranesp 300 mcg added with cycle 2.   - Chemotherapy follow up -   - Impaired intake-   -  Elevated creatinine. Cr 1.59 previously.      Platelet 235 and WBC 5.7.  Creatinine 1.59 overall stable. I will proceed with cycle 2 today.  She is having issues with fluid intake on subsequent week.  Will schedule for labs and IV fluid on week 1 and week 2.  Plan to repeat CT imaging after 3 cycles.  # Secondary metastasis to brain -MRI brain with and without contrast (09/10/2022) showed interval mild progression of left frontal calvarium lesion with extension of tumor to involve subjacent dura measuring 2.8 x 2 cm.  Suspect slight/early extension into superior left orbit as well as extension of bony edema to involve left sphenoid wing with probable slight involvement of adjacent dura in the region.  New 1 cm enhancing lesion along the posterior left temporal lobe along the falx.  -Completed palliative RT to left frontal lesion 5 fractions on 09/27/2022.  Repeat MRI from 10/25/2022 showed slight increase in the left frontal calvarium to 3.1 cm and thickened lining could be reactive versus progression.  1 cm lesion in the posterior temporal lobe is stable.  Will continue to closely monitor.  I will also refer her to Dr. Barbaraann Cao neuro-oncology considering leptomeningeal involvement.  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 Cr stabilized.  - will continue to closely monitor.  # Chemotherapy-induced anemia  -Will monitor while on chemotherapy.   -Will proceed with Aranesp 300 mcg with every chemo cycle.  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 Aggie Cosier on 12/18/2021 -Was seen by [facility].  No surgical intervention needed.  # Access - port  RTC in 3 weeks for  MD visit, labs, cycle 3 carboplatin and Taxol.  No orders of the defined types were placed in this encounter.   [facility]- 12/06/2021   All questions were answered. The patient knows to call the clinic with any problems, questions or concerns. The total time spent in the appointment was  30 minutes encounter with patients including review of chart and various tests results, discussions about plan of care and coordination of care plan   Alinda Dooms, NP 11/18/2022 9:30 AM  INTERVAL HISTORY: Please see below for problem oriented charting.  Patient following with [place] for treatment of stage IV lung adenocarcinoma.   Patient was seen today accompanied with daughter prior to cycle 2 of [place] and Taxol. Patient was in the wheelchair today due to pain from the left hip.  She has ongoing arthritic issues and is following with orthopedics.  Reports her headache has improved.  Vision changes have also improved.  Was evaluated by ophthalmologist.  Was told she has cataract.  Denies any neuropathy.  Denies nausea vomiting.  REVIEW OF SYSTEMS:   Positive ROS as above.  Rest 10 points ROS negative   I have reviewed the past medical history, past surgical history, social history and family history with the patient and they are unchanged from previous note.  ALLERGIES:  is allergic to diclofenac, lisinopril, and sulfa antibiotics.  MEDICATIONS:  Current Outpatient Medications  Medication Sig Dispense Refill  . albuterol (VENTOLIN HFA) 108 (90 Base) MCG/ACT inhaler Inhale 2 puffs into the lungs every 6 (six) hours as needed for wheezing or shortness of breath. 18 g 0  . Azelastine HCl 137 MCG/SPRAY SOLN Place 2 sprays into both nostrils 2 (two) times daily.    . Cholecalciferol (VITAMIN D-3 PO) Take by mouth.    . fluticasone (FLONASE) 50 MCG/ACT nasal spray USE TWO SPRAYS IN EACH NOSTRIL ONCE DAILY prn 16 g 12  . loratadine (CLARITIN) 10 MG tablet TAKE ONE TABLET BY MOUTH DAILY AS NEEDED FOR ALLERGIES 90 tablet 3  . losartan (COZAAR) 100 MG tablet Take 1 tablet (100 mg total) by mouth daily. 90 tablet 3  . MULTIPLE VITAMIN PO Take 1 tablet by mouth daily.    . nebivolol (BYSTOLIC) 2.5 MG tablet Take 1 tablet (2.5 mg total) by mouth daily. 90 tablet 0  . traMADol (ULTRAM) 50 MG tablet  Take 1 tablet (50 mg total) by mouth every 6 (six) hours as needed. 60 tablet 0  . dexamethasone (DECADRON) 4 MG tablet Take 1 tablet (4 mg total) by mouth daily. (Patient not taking: Reported on 10/30/2022) 20 tablet 0   No current facility-administered medications for this visit.   Facility-Administered Medications Ordered in Other Visits  Medication Dose Route Frequency Provider Last Rate Last Admin  . heparin lock flush 100 UNIT/ML injection           . heparin lock flush 100 unit/mL  500 Units Intravenous Once Michaelyn Barter, MD      . sodium chloride flush (NS) 0.9 % injection 10 mL  10 mL Intravenous Once Michaelyn Barter, MD        SUMMARY OF ONCOLOGIC HISTORY: Oncology History  Primary lung adenocarcinoma (HCC)  10/10/2021 Initial Diagnosis   Primary lung adenocarcinoma (HCC) Stage IV  Patient seen by PCP for wheezing for 2 weeks. Imaging with CXR followed by CT chest showed Spiculated 2.1 x 2.2 cm left upper lobe pulmonary nodule with associated pleural tethering. Innumerable subcentimeter pulmonary nodules throughout the lungs.  11/14/2021 PET scan   IMPRESSION: 1. Left suprahilar nodule with maximum SUV 4.5, and a more distal 2.2 cm left upper lobe nodule with maximum SUV of 5.5. 2. Innumerable scattered small pulmonary nodules, most of which are under 5 mm in diameter, throughout both lungs. Some are cavitary. Possibilities include cavitating hematogenous disseminated malignancy versus infectious etiology subset as septic emboli. Most of the nodules are stable, some are minimally enlarged compared to 10/10/2021. 3. Abnormal mild permeative type bony findings in the right hemipelvis associated with substantially accentuated metabolic activity. Possible associated pathologic fracture through the right quadrilateral plate and right acetabulum. The findings involve the right ilium and ischium but also the right lateral sacral ala and a small focus in the left sacrum.     Pathology Results   She had transbronchial biopsies by Dr. Jayme Cloud on 11/19/21.  Pathology from LUL nodule showed adenocarcinoma, consistent with lung primary.    11/19/2021 Cancer Staging   Staging form: Lung, AJCC 8th Edition - Clinical stage from 11/19/2021: Stage IVB (cT1c, cM1c) - Signed by Michaelyn Barter, MD on 11/27/2021 Stage prefix: Initial diagnosis   11/27/2021 Imaging   MRI pelvis  IMPRESSION: 1. Abnormal marrow signal throughout the right ilium involving the right acetabulum, right superior pubic ramus and right inferior pubic ramus, right side of the sacrum and a smaller focus of abnormal marrow lesion in the left side of the sacrum. Findings are concerning for metastatic disease. 2. Nondisplaced pathologic fracture of the quadrilateral plate of the right acetabulum. 3. Mild muscle edema in the right gluteus minimus and medius muscles likely reflecting mild muscle strain.  MRI brain  IMPRESSION: No evidence of intracranial metastatic disease.   Indeterminate 1 cm left frontal calvarium lesion.   12/18/2021 - 08/27/2022 Chemotherapy   Patient is on Treatment Plan : LUNG Carboplatin (5) + Pemetrexed (500) + Pembrolizumab (200) D1 q21d Induction x 4 cycles / Maintenance Pemetrexed (500) + Pembrolizumab (200) D1 q21d     10/07/2022 -  Chemotherapy   Patient is on Treatment Plan : LUNG Carboplatin + Paclitaxel q21d Dose Reduction     Primary malignant neoplasm of left upper lobe of lung (HCC)  06/04/2022 Initial Diagnosis   Primary malignant neoplasm of left upper lobe of lung (HCC)   06/04/2022 Cancer Staging   Staging form: Lung, AJCC 8th Edition - Pathologic: Stage IVB (pT1c, pNX, cM1c) - Signed by Earna Coder, MD on 06/04/2022     PHYSICAL EXAMINATION: ECOG PERFORMANCE STATUS: 2 - Symptomatic, <50% confined to bed  Vitals:   11/18/22 0923  BP: 123/81  Pulse: 84  Temp: (!) 97.3 F (36.3 C)  SpO2: 100%    Filed Weights   11/18/22 0923  Weight: 129  lb (58.5 kg)     Physical Exam Constitutional:      Appearance: Normal appearance.  HENT:     Head: Normocephalic and atraumatic.  Cardiovascular:     Rate and Rhythm: Normal rate.  Pulmonary:     Effort: Pulmonary effort is normal.  Musculoskeletal:     Right lower leg: No edema.     Left lower leg: No edema.  Skin:    General: Skin is warm.  Neurological:     General: No focal deficit present.     Mental Status: She is oriented to person, place, and time.  Psychiatric:        Mood and Affect: Mood normal.    LABORATORY DATA:  I have reviewed the data as listed  Component Value Date/Time   NA 135 11/11/2022 0901   K 4.0 11/11/2022 0901   CL 106 11/11/2022 0901   CO2 18 (L) 11/11/2022 0901   GLUCOSE 129 (H) 11/11/2022 0901   BUN 28 (H) 11/11/2022 0901   CREATININE 0.56 11/11/2022 0901   CREATININE 1.59 (H) 11/04/2022 0831   CALCIUM 8.9 11/11/2022 0901   PROT 5.8 (L) 11/11/2022 0901   ALBUMIN 3.3 (L) 11/11/2022 0901   AST 19 11/11/2022 0901   AST 21 11/04/2022 0831   ALT 18 11/11/2022 0901   ALT 14 11/04/2022 0831   ALKPHOS 238 (H) 11/11/2022 0901   BILITOT 0.5 11/11/2022 0901   BILITOT 0.5 11/04/2022 0831   GFRNONAA >60 11/11/2022 0901   GFRNONAA 35 (L) 11/04/2022 0831    No results found for: "SPEP", "UPEP"  Lab Results  Component Value Date   WBC 11.6 (H) 11/11/2022   NEUTROABS 7.2 11/11/2022   HGB 9.0 (L) 11/11/2022   HCT 27.8 (L) 11/11/2022   MCV 101.5 (H) 11/11/2022   PLT 152 11/11/2022      Chemistry      Component Value Date/Time   NA 135 11/11/2022 0901   K 4.0 11/11/2022 0901   CL 106 11/11/2022 0901   CO2 18 (L) 11/11/2022 0901   BUN 28 (H) 11/11/2022 0901   CREATININE 0.56 11/11/2022 0901   CREATININE 1.59 (H) 11/04/2022 0831      Component Value Date/Time   CALCIUM 8.9 11/11/2022 0901   ALKPHOS 238 (H) 11/11/2022 0901   AST 19 11/11/2022 0901   AST 21 11/04/2022 0831   ALT 18 11/11/2022 0901   ALT 14 11/04/2022 0831    BILITOT 0.5 11/11/2022 0901   BILITOT 0.5 11/04/2022 0831       RADIOGRAPHIC STUDIES: I have personally reviewed the radiological images as listed and agreed with the findings in the report. MR Brain W Wo Contrast  Result Date: 11/02/2022 CLINICAL DATA:  follow up brain mets EXAM: MRI HEAD WITHOUT AND WITH CONTRAST TECHNIQUE: Multiplanar, multiecho pulse sequences of the brain and surrounding structures were obtained without and with intravenous contrast. CONTRAST:  6mL GADAVIST GADOBUTROL 1 MMOL/ML IV SOLN COMPARISON:  None Available. FINDINGS: Brain: Similar enhancing intraparenchymal metastasis measuring approximately 1 cm in the posterior left temporal lobe (series 1024, image 108). Additional smaller osseous metastatic disease in the more posterior left calvarium is similar. Redemonstrated extension of enhancing tumor to involve the left sphenoid. Bulky left frontal necrotic osseous calvarial metastasis measures slightly increased in size (3.1 by 1.9 cm, previously 2.8 x 1.9 cm). New faint wispy enhancement in adjacent left frontal sulci. Also, increased conspicuity and extent of left frontal dural thickening and enhancement. No evidence of acute infarct, acute hemorrhage, midline shift or hydrocephalus. Vascular: Major arterial flow voids are maintained. Skull and upper cervical spine: Osseous metastatic disease as detailed above. Sinuses/Orbits: Similar suspected extension of osseous metastatic disease into the superior left orbit (for example series 12, image 36) without mass effect. Clear sinuses. IMPRESSION: 1. Similar 1 cm left posterior temporal lobe intraparenchymal metastasis. 2. Bulky left frontal necrotic osseous calvarial metastasis measures slightly increased in size (3.1 by 1.9 cm, previously 2.8 x 1.9 cm). Redemonstrated intracranial extension with increased extent of left frontal dural thickening and new faint wispy enhancement in adjacent sulci, which could represent reactive change  and/or progressive leptomeningeal disease. 3. Similar suspected extension of osseous metastatic disease into the superior left orbit without significant orbital mass effect. Electronically Signed   By:  Feliberto Harts M.D.   On: 11/02/2022 14:20

## 2022-11-19 ENCOUNTER — Encounter: Payer: Self-pay | Admitting: Internal Medicine

## 2022-11-19 ENCOUNTER — Inpatient Hospital Stay: Payer: PPO

## 2022-11-19 NOTE — Progress Notes (Signed)
Nutrition Follow-up:  Patient with stage IV lung cancer.  Noted per MD note disease progression 09/13/22 in bone, lungs and brain.  Started on second line carbo/taxol. Completed radiation to left frontal lesion on 09/27/22.    Spoke with patient via phone for nutrition follow-up.  Patient reports that she was having issues with queasy feeling but this has gotten better.  Her appetite has improved.  She drank a premier protein shake this am.  Last night able to eat shrimp with noodles and marinara sauce.  Lunch yesterday was 2 hot dogs.  She tries to make a milkshake each night after dinner.  Likes to snack on cashews.      Medications: reviewed  Labs: reviewed  Anthropometrics:   Weight 129 lb on 7/29  141 lb 3.2 oz on 4/16 141 lb 11/2 oz on 2/13  9% weight loss in the last 3 months, significant  NUTRITION DIAGNOSIS: Unintentional weight loss related to cancer and related treatment side effects as evidenced by 9% weight loss in the last 3 months and decreased appetite now improving    INTERVENTION:  Discussed ways to add more calories in diet. (Healthy oils, nuts, avocados) Encouraged 350 calorie shake or higher    MONITORING, EVALUATION, GOAL: weight trends, intake   NEXT VISIT: Tuesday, August 27 phone call  Vonn Sliger B. Freida Busman, RD, LDN Registered Dietitian [phone]

## 2022-11-20 ENCOUNTER — Other Ambulatory Visit: Payer: Self-pay | Admitting: Family Medicine

## 2022-11-20 ENCOUNTER — Encounter: Payer: Self-pay | Admitting: Internal Medicine

## 2022-11-20 ENCOUNTER — Encounter (INDEPENDENT_AMBULATORY_CARE_PROVIDER_SITE_OTHER): Payer: Self-pay

## 2022-11-20 ENCOUNTER — Ambulatory Visit
Admission: RE | Admit: 2022-11-20 | Discharge: 2022-11-20 | Disposition: A | Discharge status: Home / Self Care | Payer: PPO | Source: Ambulatory Visit | Attending: Family Medicine | Admitting: Family Medicine

## 2022-11-20 DIAGNOSIS — M25552 Pain in left hip: Secondary | ICD-10-CM | POA: Insufficient documentation

## 2022-11-21 ENCOUNTER — Other Ambulatory Visit: Payer: Self-pay

## 2022-11-21 ENCOUNTER — Telehealth: Payer: Self-pay

## 2022-11-21 DIAGNOSIS — I10 Essential (primary) hypertension: Secondary | ICD-10-CM

## 2022-11-21 NOTE — Telephone Encounter (Signed)
losartan (COZAAR) 100 MG tablet  Last refill 10/04/2021 # 90 RF3 LOV 10/04/2021 NOV 12/12/2022

## 2022-11-21 NOTE — Telephone Encounter (Signed)
Incoming call from [place] from [facility] working with NP [employer]. Patient was seen in [place] [facility] on yesterday due to left hip pain. [facility] ordered a STAT MRI of the left Hip. MRI showed Metastasis to left hip. They wanted to make her oncology team aware. Their callback [phone]

## 2022-11-22 ENCOUNTER — Encounter: Payer: Self-pay | Admitting: Internal Medicine

## 2022-11-22 MED ORDER — LOSARTAN POTASSIUM 100 MG PO TABS
100.0000 mg | ORAL_TABLET | Freq: Every day | ORAL | 0 refills | Status: DC
Start: 2022-11-22 — End: 2023-01-02

## 2022-11-22 NOTE — Addendum Note (Signed)
Addended byMichaelyn Barter on: 11/22/2022 12:49 PM   Modules accepted: Orders

## 2022-11-24 ENCOUNTER — Other Ambulatory Visit: Payer: Self-pay

## 2022-11-25 ENCOUNTER — Inpatient Hospital Stay: Payer: PPO

## 2022-11-25 ENCOUNTER — Inpatient Hospital Stay: Payer: PPO | Admitting: Internal Medicine

## 2022-11-25 ENCOUNTER — Inpatient Hospital Stay: Payer: PPO | Attending: Internal Medicine

## 2022-11-25 ENCOUNTER — Encounter: Payer: Self-pay | Admitting: Internal Medicine

## 2022-11-25 VITALS — BP 124/68 | HR 76 | Temp 98.9°F | Resp 20 | Wt 117.0 lb

## 2022-11-25 DIAGNOSIS — R202 Paresthesia of skin: Secondary | ICD-10-CM | POA: Insufficient documentation

## 2022-11-25 DIAGNOSIS — D6481 Anemia due to antineoplastic chemotherapy: Secondary | ICD-10-CM

## 2022-11-25 DIAGNOSIS — N189 Chronic kidney disease, unspecified: Secondary | ICD-10-CM | POA: Diagnosis not present

## 2022-11-25 DIAGNOSIS — Z510 Encounter for antineoplastic radiation therapy: Secondary | ICD-10-CM | POA: Insufficient documentation

## 2022-11-25 DIAGNOSIS — R200 Anesthesia of skin: Secondary | ICD-10-CM | POA: Diagnosis not present

## 2022-11-25 DIAGNOSIS — Z5111 Encounter for antineoplastic chemotherapy: Secondary | ICD-10-CM | POA: Diagnosis present

## 2022-11-25 DIAGNOSIS — M25552 Pain in left hip: Secondary | ICD-10-CM

## 2022-11-25 DIAGNOSIS — T451X5A Adverse effect of antineoplastic and immunosuppressive drugs, initial encounter: Secondary | ICD-10-CM | POA: Insufficient documentation

## 2022-11-25 DIAGNOSIS — C7931 Secondary malignant neoplasm of brain: Secondary | ICD-10-CM

## 2022-11-25 DIAGNOSIS — K1231 Oral mucositis (ulcerative) due to antineoplastic therapy: Secondary | ICD-10-CM | POA: Diagnosis not present

## 2022-11-25 DIAGNOSIS — C3412 Malignant neoplasm of upper lobe, left bronchus or lung: Secondary | ICD-10-CM | POA: Insufficient documentation

## 2022-11-25 DIAGNOSIS — C3490 Malignant neoplasm of unspecified part of unspecified bronchus or lung: Secondary | ICD-10-CM | POA: Diagnosis not present

## 2022-11-25 DIAGNOSIS — M4856XA Collapsed vertebra, not elsewhere classified, lumbar region, initial encounter for fracture: Secondary | ICD-10-CM | POA: Insufficient documentation

## 2022-11-25 DIAGNOSIS — I129 Hypertensive chronic kidney disease with stage 1 through stage 4 chronic kidney disease, or unspecified chronic kidney disease: Secondary | ICD-10-CM | POA: Insufficient documentation

## 2022-11-25 DIAGNOSIS — C7951 Secondary malignant neoplasm of bone: Secondary | ICD-10-CM | POA: Insufficient documentation

## 2022-11-25 DIAGNOSIS — Z7952 Long term (current) use of systemic steroids: Secondary | ICD-10-CM | POA: Diagnosis not present

## 2022-11-25 DIAGNOSIS — Z79899 Other long term (current) drug therapy: Secondary | ICD-10-CM | POA: Insufficient documentation

## 2022-11-25 DIAGNOSIS — G893 Neoplasm related pain (acute) (chronic): Secondary | ICD-10-CM | POA: Diagnosis not present

## 2022-11-25 LAB — CBC WITH DIFFERENTIAL (CANCER CENTER ONLY)
Abs Immature Granulocytes: 0.02 10*3/uL (ref 0.00–0.07)
Basophils Absolute: 0 10*3/uL (ref 0.0–0.1)
Basophils Relative: 0 %
Eosinophils Absolute: 0.1 10*3/uL (ref 0.0–0.5)
Eosinophils Relative: 1 %
HCT: 27.8 % — ABNORMAL LOW (ref 36.0–46.0)
Hemoglobin: 8.8 g/dL — ABNORMAL LOW (ref 12.0–15.0)
Immature Granulocytes: 0 %
Lymphocytes Relative: 16 %
Lymphs Abs: 0.8 10*3/uL (ref 0.7–4.0)
MCH: 33.3 pg (ref 26.0–34.0)
MCHC: 31.7 g/dL (ref 30.0–36.0)
MCV: 105.3 fL — ABNORMAL HIGH (ref 80.0–100.0)
Monocytes Absolute: 0.4 10*3/uL (ref 0.1–1.0)
Monocytes Relative: 8 %
Neutro Abs: 3.8 10*3/uL (ref 1.7–7.7)
Neutrophils Relative %: 75 %
Platelet Count: 141 10*3/uL — ABNORMAL LOW (ref 150–400)
RBC: 2.64 MIL/uL — ABNORMAL LOW (ref 3.87–5.11)
RDW: 17.9 % — ABNORMAL HIGH (ref 11.5–15.5)
WBC Count: 5.1 10*3/uL (ref 4.0–10.5)
nRBC: 0 % (ref 0.0–0.2)

## 2022-11-25 LAB — CMP (CANCER CENTER ONLY)
ALT: 16 U/L (ref 0–44)
AST: 17 U/L (ref 15–41)
Albumin: 3.4 g/dL — ABNORMAL LOW (ref 3.5–5.0)
Alkaline Phosphatase: 340 U/L — ABNORMAL HIGH (ref 38–126)
Anion gap: 7 (ref 5–15)
BUN: 29 mg/dL — ABNORMAL HIGH (ref 8–23)
CO2: 20 mmol/L — ABNORMAL LOW (ref 22–32)
Calcium: 8.8 mg/dL — ABNORMAL LOW (ref 8.9–10.3)
Chloride: 110 mmol/L (ref 98–111)
Creatinine: 1.35 mg/dL — ABNORMAL HIGH (ref 0.44–1.00)
GFR, Estimated: 42 mL/min — ABNORMAL LOW (ref >60)
Glucose, Bld: 103 mg/dL — ABNORMAL HIGH (ref 70–99)
Potassium: 3.9 mmol/L (ref 3.5–5.1)
Sodium: 137 mmol/L (ref 135–145)
Total Bilirubin: 0.3 mg/dL (ref 0.3–1.2)
Total Protein: 6.4 g/dL — ABNORMAL LOW (ref 6.5–8.1)

## 2022-11-25 MED ORDER — PALONOSETRON HCL INJECTION 0.25 MG/5ML
0.2500 mg | Freq: Once | INTRAVENOUS | Status: AC
  Administered 2022-11-25: 0.25 mg via INTRAVENOUS
  Filled 2022-11-25: qty 5

## 2022-11-25 MED ORDER — DEXAMETHASONE 4 MG PO TABS: 4.0000 mg | ORAL_TABLET | Freq: Every day | ORAL | 0 refills | Status: DC

## 2022-11-25 MED ORDER — SODIUM CHLORIDE 0.9% FLUSH
10.0000 mL | INTRAVENOUS | Status: DC | PRN
  Filled 2022-11-25: qty 10

## 2022-11-25 MED ORDER — FAMOTIDINE IN NACL 20-0.9 MG/50ML-% IV SOLN
20.0000 mg | Freq: Once | INTRAVENOUS | Status: AC
  Administered 2022-11-25: 20 mg via INTRAVENOUS
  Filled 2022-11-25: qty 50

## 2022-11-25 MED ORDER — SODIUM CHLORIDE 0.9 % IV SOLN
150.0000 mg | Freq: Once | INTRAVENOUS | Status: AC
  Administered 2022-11-25: 150 mg via INTRAVENOUS
  Filled 2022-11-25: qty 150

## 2022-11-25 MED ORDER — SODIUM CHLORIDE 0.9 % IV SOLN
240.0000 mg | Freq: Once | INTRAVENOUS | Status: AC
  Administered 2022-11-25: 240 mg via INTRAVENOUS
  Filled 2022-11-25: qty 24

## 2022-11-25 MED ORDER — SODIUM CHLORIDE 0.9 % IV SOLN
Freq: Once | INTRAVENOUS | Status: AC
  Filled 2022-11-25: qty 250

## 2022-11-25 MED ORDER — SODIUM CHLORIDE 0.9 % IV SOLN
150.0000 mg/m2 | Freq: Once | INTRAVENOUS | Status: AC
  Administered 2022-11-25: 246 mg via INTRAVENOUS
  Filled 2022-11-25: qty 41

## 2022-11-25 MED ORDER — PEGFILGRASTIM 6 MG/0.6ML ~~LOC~~ PSKT
6.0000 mg | PREFILLED_SYRINGE | Freq: Once | SUBCUTANEOUS | Status: AC
  Administered 2022-11-25: 6 mg via SUBCUTANEOUS
  Filled 2022-11-25: qty 0.6

## 2022-11-25 MED ORDER — TRAMADOL HCL 25 MG PO TABS: 1.0000 | ORAL_TABLET | Freq: Four times a day (QID) | ORAL | 0 refills | Status: DC | PRN

## 2022-11-25 MED ORDER — SODIUM CHLORIDE 0.9 % IV SOLN
10.0000 mg | Freq: Once | INTRAVENOUS | Status: AC
  Administered 2022-11-25: 10 mg via INTRAVENOUS
  Filled 2022-11-25: qty 10

## 2022-11-25 MED ORDER — DARBEPOETIN ALFA 300 MCG/0.6ML IJ SOSY
300.0000 ug | PREFILLED_SYRINGE | Freq: Once | INTRAMUSCULAR | Status: AC
  Administered 2022-11-25: 300 ug via SUBCUTANEOUS
  Filled 2022-11-25: qty 0.6

## 2022-11-25 MED ORDER — HEPARIN SOD (PORK) LOCK FLUSH 100 UNIT/ML IV SOLN
500.0000 [IU] | Freq: Once | INTRAVENOUS | Status: AC | PRN
  Administered 2022-11-25: 500 [IU]
  Filled 2022-11-25: qty 5

## 2022-11-25 MED ORDER — DIPHENHYDRAMINE HCL 50 MG/ML IJ SOLN
50.0000 mg | Freq: Once | INTRAMUSCULAR | Status: AC
  Administered 2022-11-25: 50 mg via INTRAVENOUS
  Filled 2022-11-25: qty 1

## 2022-11-25 NOTE — Progress Notes (Signed)
[facility] OFFICE PROGRESS NOTE  Patient Care Team: Dana Allan, MD as PCP - General (Family Medicine) Glory Buff, RN as Oncology Nurse Navigator Michaelyn Barter, MD as Consulting Physician (Oncology)  TREATMENT:  Right hip palliative RT 30 cGy completed 12/18/2021 Carboplatin, alimta and Keytruda x 4 cycles completed 02/19/22.  Discontinued maintenance alimta (04-23-22) due to worsening renal dysfunction, transaminitis and cytopenias Keytruda maintenance-discontinued on 09/17/2022 due to disease progression Palliative RT to left frontal lesion 5 fx completed 09/27/22 Carboplatin (AUC 4) and paclitaxel 150 mg/m2 - started 10/07/2022   ASSESSMENT & PLAN:  # Primary lung adenocarcinoma (HCC), Stage IV, PDL1 1% -Treated with 4 cycles of carboplatin and Alimta then on maintenance Keytruda until May 2024.  Initial Foundation 1 testing showed PD-L1 1%.  No other targetable mutations.  Report below  -discontinued maintenance Alimta on 04/23/2022 due to worsening renal function, hematologic toxicity requiring multiple blood transfusions and elevated LFTs despite dose reductions.   -Restaging CT chest abdomen pelvis without contrast (09/10/2022)-marked interval disease progression in lungs, bone and also brain.  Had second opinion with Dr. Kemper Durie at [facility].  No clinical trials available due to brain mets progression.  Liquid foundation 1 testing did not show any targetable mutations.  Tissue biopsy recommended by [facility] for NGS testing.  Recommendations were discussed with the patient.  Pros and cons was discussed about the yield of bone biopsy with the use of decalcifying agent versus lung biopsy.  Patient was seen by pulmonary and the risks benefits were discussed.  Patient decided to proceed with the procedure and is scheduled for end of August.   -Started on second line carbo AUC 4 and Taxol 150 mg/m on 10/07/2022.  Here for cycle 3.  Labs reviewed and acceptable for treatment.  Will proceed  with cycle 3 of carbo and Taxol.   Will schedule for labs and IV fluid on week 1 and week 2.  Scheduled for restaging CT chest abdomen pelvis on August 20.  # Secondary metastasis to brain -MRI brain with and without contrast (09/10/2022) showed interval mild progression of left frontal calvarium lesion with extension of tumor to involve subjacent dura measuring 2.8 x 2 cm.  Suspect slight/early extension into superior left orbit as well as extension of bony edema to involve left sphenoid wing with probable slight involvement of adjacent dura in the region.  New 1 cm enhancing lesion along the posterior left temporal lobe along the falx.  -Completed palliative RT to left frontal lesion 5 fractions on 09/27/2022.  Repeat MRI from 10/25/2022 showed slight increase in the left frontal calvarium to 3.1 cm and thickened lining could be reactive versus progression.  1 cm lesion in the posterior temporal lobe is stable.  Will continue to closely monitor.  She is scheduled to see Dr. Barbaraann Cao end of this week.  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 Cr stabilized.  - will continue to closely monitor.  # Chemotherapy-induced anemia  -Will monitor while on chemotherapy.   -Will proceed with Aranesp 300 mcg with every chemo cycle.  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 Aggie Cosier on 12/18/2021 -Was seen by [facility].  No surgical intervention needed.  # Left hip pain -Patient was following with orthopedics.  MRI left hip showing metastatic lesions.  This was compared to the CT pelvis done in May and the lesions were present even back then but not reported and it looks unchanged. -She is on  tramadol 50 mg every 6 hours as needed.  Pain is not controlled.  Referral to Dr. Aggie Cosier for palliative RT.  Increase tramadol to 75 mg.  Patient is not interested in morphine.  Could not tolerate oxycodone due to itching. -Considering her renal function has improved and  stabilized, I discussed about role of Xgeva 120 mg monthly subcu injection.  Requested for dental clearance.  # Access - port  RTC in 3 weeks for MD visit, labs, cycle 4 carboplatin and Taxol.  Orders Placed This Encounter  Procedures   CT ABDOMEN PELVIS WO CONTRAST    Standing Status:   Future    Standing Expiration Date:   11/25/2023    Order Specific Question:   Preferred imaging location?    Answer:   [facility]    Order Specific Question:   If indicated for the ordered procedure, I authorize the administration of oral contrast media per Radiology protocol    Answer:   Yes    Order Specific Question:   Does the patient have a contrast media/X-ray dye allergy?    Answer:   No    Order Specific Question:   If indicated for the ordered procedure, I authorize the administration of oral contrast media per Radiology protocol    Answer:   Yes   CBC with Differential (Cancer Center Only)    Standing Status:   Future    Standing Expiration Date:   11/25/2023   CBC with Differential (Cancer Center Only)    Standing Status:   Future    Standing Expiration Date:   11/25/2023   Basic Metabolic Panel - Cancer Center Only    Standing Status:   Future    Standing Expiration Date:   11/25/2023   Basic Metabolic Panel - Cancer Center Only    Standing Status:   Future    Standing Expiration Date:   11/25/2023   Hold Tube- Blood Bank    Standing Status:   Future    Standing Expiration Date:   11/25/2023   Hold Tube- Blood Bank    Standing Status:   Future    Standing Expiration Date:   11/25/2023    [facility]- 12/06/2021   All questions were answered. The patient knows to call the clinic with any problems, questions or concerns. The total time spent in the appointment was 30 minutes encounter with patients including review of chart and various tests results, discussions about plan of care and coordination of care plan   Michaelyn Barter, MD 11/25/2022 3:26 PM  INTERVAL HISTORY: Please see  below for problem oriented charting.  Patient following with [place] for treatment of stage IV lung adenocarcinoma.   Patient was seen today accompanied with daughter prior to cycle 3 of [place] and Taxol. Patient was in the wheelchair today due to pain from the left hip.  Continues to have left hip pain.  Not manageable with tramadol.  Oxycodone could not tolerate due to itching.  Reports transient tingling and numbness in hands and feet.  Appetite is fair.  Energy level is fair.  Denies nausea.  REVIEW OF SYSTEMS:   Positive ROS as above.  Rest 10 points ROS negative   I have reviewed the past medical history, past surgical history, social history and family history with the patient and they are unchanged from previous note.  ALLERGIES:  is allergic to diclofenac, lisinopril, and sulfa antibiotics.  MEDICATIONS:  Current Outpatient Medications  Medication Sig Dispense Refill   albuterol (VENTOLIN  HFA) 108 (90 Base) MCG/ACT inhaler Inhale 2 puffs into the lungs every 6 (six) hours as needed for wheezing or shortness of breath. 18 g 0   Azelastine HCl 137 MCG/SPRAY SOLN Place 2 sprays into both nostrils 2 (two) times daily.     Cholecalciferol (VITAMIN D-3 PO) Take by mouth.     fluticasone (FLONASE) 50 MCG/ACT nasal spray USE TWO SPRAYS IN EACH NOSTRIL ONCE DAILY prn 16 g 12   loratadine (CLARITIN) 10 MG tablet TAKE ONE TABLET BY MOUTH DAILY AS NEEDED FOR ALLERGIES 90 tablet 3   losartan (COZAAR) 100 MG tablet Take 1 tablet (100 mg total) by mouth daily. 30 tablet 0   MULTIPLE VITAMIN PO Take 1 tablet by mouth daily.     nebivolol (BYSTOLIC) 2.5 MG tablet Take 1 tablet (2.5 mg total) by mouth daily. 90 tablet 0   traMADol (ULTRAM) 50 MG tablet Take 1 tablet (50 mg total) by mouth every 6 (six) hours as needed. 60 tablet 0   traMADol HCl 25 MG TABS Take 1 tablet by mouth every 6 (six) hours as needed for up to 90 doses. Please add with your 50 mg tramadol to make it 75 mg every 6 hours as needed  for pain. 90 tablet 0   dexamethasone (DECADRON) 4 MG tablet Take 1 tablet (4 mg total) by mouth daily. Take for 3 days after chemotherapy 30 tablet 0   HYDROcodone-acetaminophen (NORCO/VICODIN) 5-325 MG tablet Take by mouth. (Patient not taking: Reported on 11/25/2022)     No current facility-administered medications for this visit.   Facility-Administered Medications Ordered in Other Visits  Medication Dose Route Frequency Provider Last Rate Last Admin   CARBOplatin (PARAPLATIN) 240 mg in sodium chloride 0.9 % 100 mL chemo infusion  240 mg Intravenous Once Michaelyn Barter, MD       heparin lock flush 100 UNIT/ML injection            heparin lock flush 100 unit/mL  500 Units Intravenous Once Michaelyn Barter, MD       heparin lock flush 100 unit/mL  500 Units Intracatheter Once PRN Michaelyn Barter, MD       pegfilgrastim (NEULASTA ONPRO KIT) injection 6 mg  6 mg Subcutaneous Once Michaelyn Barter, MD       sodium chloride flush (NS) 0.9 % injection 10 mL  10 mL Intravenous Once Michaelyn Barter, MD       sodium chloride flush (NS) 0.9 % injection 10 mL  10 mL Intracatheter PRN Michaelyn Barter, MD        SUMMARY OF ONCOLOGIC HISTORY: Oncology History  Primary lung adenocarcinoma (HCC)  10/10/2021 Initial Diagnosis   Primary lung adenocarcinoma (HCC) Stage IV  Patient seen by PCP for wheezing for 2 weeks. Imaging with CXR followed by CT chest showed Spiculated 2.1 x 2.2 cm left upper lobe pulmonary nodule with associated pleural tethering. Innumerable subcentimeter pulmonary nodules throughout the lungs.      11/14/2021 PET scan   IMPRESSION: 1. Left suprahilar nodule with maximum SUV 4.5, and a more distal 2.2 cm left upper lobe nodule with maximum SUV of 5.5. 2. Innumerable scattered small pulmonary nodules, most of which are under 5 mm in diameter, throughout both lungs. Some are cavitary. Possibilities include cavitating hematogenous disseminated malignancy versus infectious etiology  subset as septic emboli. Most of the nodules are stable, some are minimally enlarged compared to 10/10/2021. 3. Abnormal mild permeative type bony findings in the right hemipelvis associated with substantially accentuated  metabolic activity. Possible associated pathologic fracture through the right quadrilateral plate and right acetabulum. The findings involve the right ilium and ischium but also the right lateral sacral ala and a small focus in the left sacrum.    Pathology Results   She had transbronchial biopsies by Dr. Jayme Cloud on 11/19/21.  Pathology from LUL nodule showed adenocarcinoma, consistent with lung primary.    11/19/2021 Cancer Staging   Staging form: Lung, AJCC 8th Edition - Clinical stage from 11/19/2021: Stage IVB (cT1c, cM1c) - Signed by Michaelyn Barter, MD on 11/27/2021 Stage prefix: Initial diagnosis   11/27/2021 Imaging   MRI pelvis  IMPRESSION: 1. Abnormal marrow signal throughout the right ilium involving the right acetabulum, right superior pubic ramus and right inferior pubic ramus, right side of the sacrum and a smaller focus of abnormal marrow lesion in the left side of the sacrum. Findings are concerning for metastatic disease. 2. Nondisplaced pathologic fracture of the quadrilateral plate of the right acetabulum. 3. Mild muscle edema in the right gluteus minimus and medius muscles likely reflecting mild muscle strain.  MRI brain  IMPRESSION: No evidence of intracranial metastatic disease.   Indeterminate 1 cm left frontal calvarium lesion.   12/18/2021 - 08/27/2022 Chemotherapy   Patient is on Treatment Plan : LUNG Carboplatin (5) + Pemetrexed (500) + Pembrolizumab (200) D1 q21d Induction x 4 cycles / Maintenance Pemetrexed (500) + Pembrolizumab (200) D1 q21d     10/07/2022 -  Chemotherapy   Patient is on Treatment Plan : LUNG Carboplatin + Paclitaxel q21d Dose Reduction     Primary malignant neoplasm of left upper lobe of lung (HCC)  06/04/2022 Initial  Diagnosis   Primary malignant neoplasm of left upper lobe of lung (HCC)   06/04/2022 Cancer Staging   Staging form: Lung, AJCC 8th Edition - Pathologic: Stage IVB (pT1c, pNX, cM1c) - Signed by Earna Coder, MD on 06/04/2022     PHYSICAL EXAMINATION: ECOG PERFORMANCE STATUS: 2 - Symptomatic, <50% confined to bed  Vitals:   11/25/22 0934  BP: 124/68  Pulse: 76  Resp: 20  Temp: 98.9 F (37.2 C)  SpO2: 100%    Filed Weights   11/25/22 0934  Weight: 117 lb (53.1 kg)     Physical Exam Constitutional:      Appearance: Normal appearance.  HENT:     Head: Normocephalic and atraumatic.  Cardiovascular:     Rate and Rhythm: Normal rate.  Pulmonary:     Effort: Pulmonary effort is normal.  Musculoskeletal:     Right lower leg: No edema.     Left lower leg: No edema.  Skin:    General: Skin is warm.  Neurological:     General: No focal deficit present.     Mental Status: She is oriented to person, place, and time.  Psychiatric:        Mood and Affect: Mood normal.    LABORATORY DATA:  I have reviewed the data as listed    Component Value Date/Time   NA 137 11/25/2022 0911   K 3.9 11/25/2022 0911   CL 110 11/25/2022 0911   CO2 20 (L) 11/25/2022 0911   GLUCOSE 103 (H) 11/25/2022 0911   BUN 29 (H) 11/25/2022 0911   CREATININE 1.35 (H) 11/25/2022 0911   CALCIUM 8.8 (L) 11/25/2022 0911   PROT 6.4 (L) 11/25/2022 0911   ALBUMIN 3.4 (L) 11/25/2022 0911   AST 17 11/25/2022 0911   ALT 16 11/25/2022 0911   ALKPHOS 340 (H) 11/25/2022  0911   BILITOT 0.3 11/25/2022 0911   GFRNONAA 42 (L) 11/25/2022 0911    No results found for: "SPEP", "UPEP"  Lab Results  Component Value Date   WBC 5.1 11/25/2022   NEUTROABS 3.8 11/25/2022   HGB 8.8 (L) 11/25/2022   HCT 27.8 (L) 11/25/2022   MCV 105.3 (H) 11/25/2022   PLT 141 (L) 11/25/2022      Chemistry      Component Value Date/Time   NA 137 11/25/2022 0911   K 3.9 11/25/2022 0911   CL 110 11/25/2022 0911   CO2  20 (L) 11/25/2022 0911   BUN 29 (H) 11/25/2022 0911   CREATININE 1.35 (H) 11/25/2022 0911      Component Value Date/Time   CALCIUM 8.8 (L) 11/25/2022 0911   ALKPHOS 340 (H) 11/25/2022 0911   AST 17 11/25/2022 0911   ALT 16 11/25/2022 0911   BILITOT 0.3 11/25/2022 0911       RADIOGRAPHIC STUDIES: I have personally reviewed the radiological images as listed and agreed with the findings in the report. MR HIP LEFT WO CONTRAST  Result Date: 11/21/2022 CLINICAL DATA:  Left hip pain for the past 2 months. No injury. History of lung cancer. EXAM: MR OF THE LEFT HIP WITHOUT CONTRAST TECHNIQUE: Multiplanar, multisequence MR imaging was performed. No intravenous contrast was administered. COMPARISON:  CT chest, abdomen, and pelvis dated Sep 10, 2022. MRI pelvis dated November 27, 2021. FINDINGS: Bones: Metastasis involving the left femoral head, neck, and intertrochanteric region is new since last August. Subacute to chronic pathologic fracture of the lesser trochanter is unchanged since recent CT from May, but progressed since CT from February. Mild soft tissue swelling around the left intertrochanteric femur. Left femoral intramedullary rod remains in place. Diffuse marrow signal abnormality involving the right ilium, ischium, and sacral ala demonstrates decreased T2 signal compared to prior MRI. Unchanged chronic pathologic fracture of the right acetabulum. Decreased T2 signal associated with the left sacral ala metastasis. Right-sided metastasis in the L3 vertebral body with associated mild pathologic compression fracture, not significantly changed compared to recent CT. Articular cartilage and labrum Articular cartilage:  Mild bilateral hip degenerative changes. Labrum: Grossly intact, although evaluation is limited due to lack of intra-articular fluid. No paralabral abnormality. Joint or bursal effusion Joint effusion: No significant hip joint effusion. Bursae: No focal periarticular fluid collection.  Muscles and tendons Muscles and tendons: The visualized gluteus, hamstring and iliopsoas tendons appear normal. Other findings Miscellaneous: The visualized internal pelvic contents appear unremarkable. IMPRESSION: 1. Metastasis involving the left femoral head, neck, and intertrochanteric region is new since last August. Subacute to chronic pathologic fracture of the lesser trochanter is unchanged since recent CT from May, but progressed since CT from February. No definite intertrochanteric or femoral neck fracture at this time. 2. Diffuse marrow signal abnormality involving the right ilium, ischium, and sacral alas demonstrates decreased T2 signal compared to prior MRI, likely reflecting treatment response. 3. Unchanged chronic pathologic fracture of the right acetabulum. 4. Right-sided metastasis in the L3 vertebral body with associated mild pathologic compression fracture is not significantly changed compared to recent CT. Electronically Signed   By: Obie Dredge M.D.   On: 11/21/2022 10:02

## 2022-11-25 NOTE — Patient Instructions (Signed)
[facility] [place] [facility]  Discharge Instructions: Thank you for choosing [facility] to provide your oncology and hematology care.  If you have a lab appointment with the Cancer Center, please go directly to the Cancer Center and check in at the registration area.  Wear comfortable clothing and clothing appropriate for easy access to any Portacath or PICC line.   We strive to give you quality time with your provider. You may need to reschedule your appointment if you arrive late (15 or more minutes).  Arriving late affects you and other patients whose appointments are after yours.  Also, if you miss three or more appointments without notifying the office, you may be dismissed from the clinic at the provider's discretion.      For prescription refill requests, have your pharmacy contact our office and allow 72 hours for refills to be completed.    Today you received the following chemotherapy and/or immunotherapy agents- taxol, carboplatin      To help prevent nausea and vomiting after your treatment, we encourage you to take your nausea medication as directed.  BELOW ARE SYMPTOMS THAT SHOULD BE REPORTED IMMEDIATELY: *FEVER GREATER THAN 100.4 F (38 C) OR HIGHER *CHILLS OR SWEATING *NAUSEA AND VOMITING THAT IS NOT CONTROLLED WITH YOUR NAUSEA MEDICATION *UNUSUAL SHORTNESS OF BREATH *UNUSUAL BRUISING OR BLEEDING *URINARY PROBLEMS (pain or burning when urinating, or frequent urination) *BOWEL PROBLEMS (unusual diarrhea, constipation, pain near the anus) TENDERNESS IN MOUTH AND THROAT WITH OR WITHOUT PRESENCE OF ULCERS (sore throat, sores in mouth, or a toothache) UNUSUAL RASH, SWELLING OR PAIN  UNUSUAL VAGINAL DISCHARGE OR ITCHING   Items with * indicate a potential emergency and should be followed up as soon as possible or go to the Emergency Department if any problems should occur.  Please show the CHEMOTHERAPY ALERT CARD or IMMUNOTHERAPY ALERT CARD at  check-in to the Emergency Department and triage nurse.  Should you have questions after your visit or need to cancel or reschedule your appointment, please contact [facility]  [phone] and follow the prompts.  Office hours are 8:00 a.m. to 4:30 p.m. Monday - Friday. Please note that voicemails left after 4:00 p.m. may not be returned until the following business day.  We are closed weekends and major holidays. You have access to a nurse at all times for urgent questions. Please call the main number to the clinic [phone] and follow the prompts.  For any non-urgent questions, you may also contact your provider using MyChart. We now offer e-Visits for anyone 18 and older to request care online for non-urgent symptoms. For details visit [email or web address].   Also download the MyChart app! Go to the app store, search "MyChart", open the app, select [facility], and log in with your MyChart username and password.    

## 2022-11-25 NOTE — Progress Notes (Signed)
Patient says that her left leg since may. She is interested in home health care.

## 2022-11-26 ENCOUNTER — Telehealth: Payer: Self-pay

## 2022-11-26 ENCOUNTER — Encounter: Payer: Self-pay | Admitting: Internal Medicine

## 2022-11-26 ENCOUNTER — Telehealth: Payer: Self-pay | Admitting: *Deleted

## 2022-11-26 NOTE — Telephone Encounter (Signed)
On Monday morning during patients visit, I completed the [employer] parking tag paper work and handed it over to her daughter.

## 2022-11-26 NOTE — Telephone Encounter (Signed)
[facility] clinic called and wanted to verify MD has seen MRI results as noted below.   arrative & Impression CLINICAL DATA:  Left hip pain for the past 2 months. No injury. History of lung cancer.   EXAM: MR OF THE LEFT HIP WITHOUT CONTRAST   TECHNIQUE: Multiplanar, multisequence MR imaging was performed. No intravenous contrast was administered.   COMPARISON:  CT chest, abdomen, and pelvis dated Sep 10, 2022. MRI pelvis dated November 27, 2021.   FINDINGS: Bones: Metastasis involving the left femoral head, neck, and intertrochanteric region is new since last August. Subacute to chronic pathologic fracture of the lesser trochanter is unchanged since recent CT from May, but progressed since CT from February. Mild soft tissue swelling around the left intertrochanteric femur. Left femoral intramedullary rod remains in place.   Diffuse marrow signal abnormality involving the right ilium, ischium, and sacral ala demonstrates decreased T2 signal compared to prior MRI. Unchanged chronic pathologic fracture of the right acetabulum.   Decreased T2 signal associated with the left sacral ala metastasis. Right-sided metastasis in the L3 vertebral body with associated mild pathologic compression fracture, not significantly changed compared to recent CT.   Articular cartilage and labrum   Articular cartilage:  Mild bilateral hip degenerative changes.   Labrum: Grossly intact, although evaluation is limited due to lack of intra-articular fluid. No paralabral abnormality.   Joint or bursal effusion   Joint effusion: No significant hip joint effusion.   Bursae: No focal periarticular fluid collection.   Muscles and tendons   Muscles and tendons: The visualized gluteus, hamstring and iliopsoas tendons appear normal.   Other findings   Miscellaneous: The visualized internal pelvic contents appear unremarkable.   IMPRESSION: 1. Metastasis involving the left femoral head, neck,  and intertrochanteric region is new since last August. Subacute to chronic pathologic fracture of the lesser trochanter is unchanged since recent CT from May, but progressed since CT from February. No definite intertrochanteric or femoral neck fracture at this time. 2. Diffuse marrow signal abnormality involving the right ilium, ischium, and sacral alas demonstrates decreased T2 signal compared to prior MRI, likely reflecting treatment response. 3. Unchanged chronic pathologic fracture of the right acetabulum. 4. Right-sided metastasis in the L3 vertebral body with associated mild pathologic compression fracture is not significantly changed compared to recent CT.     Electronically Signed   By: Obie Dredge M.D.   On: 11/21/2022 10:02

## 2022-11-27 ENCOUNTER — Encounter: Payer: Self-pay | Admitting: Internal Medicine

## 2022-11-28 ENCOUNTER — Other Ambulatory Visit: Payer: Self-pay | Admitting: *Deleted

## 2022-11-28 ENCOUNTER — Encounter: Payer: Self-pay | Admitting: Internal Medicine

## 2022-11-28 MED ORDER — ONDANSETRON HCL 8 MG PO TABS: 8.0000 mg | ORAL_TABLET | Freq: Three times a day (TID) | ORAL | 3 refills | Status: DC | PRN

## 2022-11-29 ENCOUNTER — Encounter: Payer: Self-pay | Admitting: Internal Medicine

## 2022-11-29 ENCOUNTER — Inpatient Hospital Stay: Payer: PPO

## 2022-11-29 ENCOUNTER — Ambulatory Visit: Payer: PPO

## 2022-11-29 ENCOUNTER — Inpatient Hospital Stay (HOSPITAL_BASED_OUTPATIENT_CLINIC_OR_DEPARTMENT_OTHER): Payer: PPO | Admitting: Internal Medicine

## 2022-11-29 VITALS — BP 107/78 | HR 88 | Temp 97.8°F | Resp 18 | Ht 59.0 in | Wt 139.2 lb

## 2022-11-29 DIAGNOSIS — Z5111 Encounter for antineoplastic chemotherapy: Secondary | ICD-10-CM | POA: Diagnosis not present

## 2022-11-29 DIAGNOSIS — C7931 Secondary malignant neoplasm of brain: Secondary | ICD-10-CM

## 2022-11-29 DIAGNOSIS — C3490 Malignant neoplasm of unspecified part of unspecified bronchus or lung: Secondary | ICD-10-CM

## 2022-11-29 LAB — CBC WITH DIFFERENTIAL (CANCER CENTER ONLY)
Abs Immature Granulocytes: 2.02 10*3/uL — ABNORMAL HIGH (ref 0.00–0.07)
Basophils Absolute: 0 10*3/uL (ref 0.0–0.1)
Basophils Relative: 0 %
Eosinophils Absolute: 0.2 10*3/uL (ref 0.0–0.5)
Eosinophils Relative: 2 %
HCT: 27.1 % — ABNORMAL LOW (ref 36.0–46.0)
Hemoglobin: 8.6 g/dL — ABNORMAL LOW (ref 12.0–15.0)
Immature Granulocytes: 19 %
Lymphocytes Relative: 4 %
Lymphs Abs: 0.4 10*3/uL — ABNORMAL LOW (ref 0.7–4.0)
MCH: 33.7 pg (ref 26.0–34.0)
MCHC: 31.7 g/dL (ref 30.0–36.0)
MCV: 106.3 fL — ABNORMAL HIGH (ref 80.0–100.0)
Monocytes Absolute: 0.1 10*3/uL (ref 0.1–1.0)
Monocytes Relative: 1 %
Neutro Abs: 7.8 10*3/uL — ABNORMAL HIGH (ref 1.7–7.7)
Neutrophils Relative %: 74 %
Platelet Count: 82 10*3/uL — ABNORMAL LOW (ref 150–400)
RBC: 2.55 MIL/uL — ABNORMAL LOW (ref 3.87–5.11)
RDW: 17.4 % — ABNORMAL HIGH (ref 11.5–15.5)
Smear Review: NORMAL
WBC Count: 10.5 10*3/uL (ref 4.0–10.5)
nRBC: 0 % (ref 0.0–0.2)

## 2022-11-29 LAB — SAMPLE TO BLOOD BANK

## 2022-11-29 MED ORDER — SODIUM CHLORIDE 0.9 % IV SOLN
Freq: Once | INTRAVENOUS | Status: AC
  Filled 2022-11-29: qty 250

## 2022-11-29 MED ORDER — SODIUM CHLORIDE 0.9% FLUSH
10.0000 mL | Freq: Once | INTRAVENOUS | Status: AC | PRN
  Administered 2022-11-29: 10 mL
  Filled 2022-11-29: qty 10

## 2022-11-29 MED ORDER — HEPARIN SOD (PORK) LOCK FLUSH 100 UNIT/ML IV SOLN
500.0000 [IU] | Freq: Once | INTRAVENOUS | Status: AC | PRN
  Administered 2022-11-29: 500 [IU]
  Filled 2022-11-29: qty 5

## 2022-11-29 NOTE — Progress Notes (Signed)
[facility] [address]  [place], [place] [address] [phone]   New Patient Evaluation  Date of Service: 11/29/22 Patient Name: SHALETHA TRA Patient MRN: [id number] Patient DOB: 12-May-1950 Provider: Henreitta Leber, MD  Identifying Statement:  ARIBEL CHAUSSEE is a 72 y.o. female with Metastasis to brain [facility]) who presents for initial consultation and evaluation regarding cancer associated neurologic deficits.    Referring Provider: Michaelyn Barter, MD [address] [place],  [place] [address]  Primary Cancer:  Oncologic History: Oncology History  Primary lung adenocarcinoma (HCC)  10/10/2021 Initial Diagnosis   Primary lung adenocarcinoma (HCC) Stage IV  Patient seen by PCP for wheezing for 2 weeks. Imaging with CXR followed by CT chest showed Spiculated 2.1 x 2.2 cm left upper lobe pulmonary nodule with associated pleural tethering. Innumerable subcentimeter pulmonary nodules throughout the lungs.      11/14/2021 PET scan   IMPRESSION: 1. Left suprahilar nodule with maximum SUV 4.5, and a more distal 2.2 cm left upper lobe nodule with maximum SUV of 5.5. 2. Innumerable scattered small pulmonary nodules, most of which are under 5 mm in diameter, throughout both lungs. Some are cavitary. Possibilities include cavitating hematogenous disseminated malignancy versus infectious etiology subset as septic emboli. Most of the nodules are stable, some are minimally enlarged compared to 10/10/2021. 3. Abnormal mild permeative type bony findings in the right hemipelvis associated with substantially accentuated metabolic activity. Possible associated pathologic fracture through the right quadrilateral plate and right acetabulum. The findings involve the right ilium and ischium but also the right lateral sacral ala and a small focus in the left sacrum.    Pathology Results   She had transbronchial biopsies by Dr. Jayme Cloud on  11/19/21.  Pathology from LUL nodule showed adenocarcinoma, consistent with lung primary.    11/19/2021 Cancer Staging   Staging form: Lung, AJCC 8th Edition - Clinical stage from 11/19/2021: Stage IVB (cT1c, cM1c) - Signed by Michaelyn Barter, MD on 11/27/2021 Stage prefix: Initial diagnosis   11/27/2021 Imaging   MRI pelvis  IMPRESSION: 1. Abnormal marrow signal throughout the right ilium involving the right acetabulum, right superior pubic ramus and right inferior pubic ramus, right side of the sacrum and a smaller focus of abnormal marrow lesion in the left side of the sacrum. Findings are concerning for metastatic disease. 2. Nondisplaced pathologic fracture of the quadrilateral plate of the right acetabulum. 3. Mild muscle edema in the right gluteus minimus and medius muscles likely reflecting mild muscle strain.  MRI brain  IMPRESSION: No evidence of intracranial metastatic disease.   Indeterminate 1 cm left frontal calvarium lesion.   12/18/2021 - 08/27/2022 Chemotherapy   Patient is on Treatment Plan : LUNG Carboplatin (5) + Pemetrexed (500) + Pembrolizumab (200) D1 q21d Induction x 4 cycles / Maintenance Pemetrexed (500) + Pembrolizumab (200) D1 q21d     10/07/2022 -  Chemotherapy   Patient is on Treatment Plan : LUNG Carboplatin + Paclitaxel q21d Dose Reduction     Primary malignant neoplasm of left upper lobe of lung (HCC)  06/04/2022 Initial Diagnosis   Primary malignant neoplasm of left upper lobe of lung (HCC)   06/04/2022 Cancer Staging   Staging form: Lung, AJCC 8th Edition - Pathologic: Stage IVB (pT1c, pNX, cM1c) - Signed by Earna Coder, MD on 06/04/2022    CNS Oncologic History 09/27/22: Completes 5 fractions to left frontal invasive calvarial metastasis ([place])   History of Present Illness: The patient's  records from the referring physician were obtained and reviewed and the patient interviewed to confirm this HPI.  JUREA CECCARELLI presents today to  review recent CNS disease and treatments.  She completed 5 sessions of radiation for progressive left frontal calvarial metastasis in early June.  Denies neurologic symptoms at this time.  Walking is limited by hip pain from cancer.  No cognitive complaints, denies headaches and seizures.  Continues on taxol and carboplatin with Dr. Alena Bills.  Medications: Current Outpatient Medications on File Prior to Visit  Medication Sig Dispense Refill   ondansetron (ZOFRAN) 8 MG tablet Take 1 tablet (8 mg total) by mouth every 8 (eight) hours as needed for nausea or vomiting. 30 tablet 3   albuterol (VENTOLIN HFA) 108 (90 Base) MCG/ACT inhaler Inhale 2 puffs into the lungs every 6 (six) hours as needed for wheezing or shortness of breath. 18 g 0   Azelastine HCl 137 MCG/SPRAY SOLN Place 2 sprays into both nostrils 2 (two) times daily.     Cholecalciferol (VITAMIN D-3 PO) Take by mouth.     dexamethasone (DECADRON) 4 MG tablet Take 1 tablet (4 mg total) by mouth daily. Take for 3 days after chemotherapy 30 tablet 0   fluticasone (FLONASE) 50 MCG/ACT nasal spray USE TWO SPRAYS IN EACH NOSTRIL ONCE DAILY prn 16 g 12   HYDROcodone-acetaminophen (NORCO/VICODIN) 5-325 MG tablet Take by mouth. (Patient not taking: Reported on 11/25/2022)     loratadine (CLARITIN) 10 MG tablet TAKE ONE TABLET BY MOUTH DAILY AS NEEDED FOR ALLERGIES 90 tablet 3   losartan (COZAAR) 100 MG tablet Take 1 tablet (100 mg total) by mouth daily. 30 tablet 0   MULTIPLE VITAMIN PO Take 1 tablet by mouth daily.     nebivolol (BYSTOLIC) 2.5 MG tablet Take 1 tablet (2.5 mg total) by mouth daily. 90 tablet 0   traMADol (ULTRAM) 50 MG tablet Take 1 tablet (50 mg total) by mouth every 6 (six) hours as needed. 60 tablet 0   traMADol HCl 25 MG TABS Take 1 tablet by mouth every 6 (six) hours as needed for up to 90 doses. Please add with your 50 mg tramadol to make it 75 mg every 6 hours as needed for pain. 90 tablet 0   Current Facility-Administered  Medications on File Prior to Visit  Medication Dose Route Frequency Provider Last Rate Last Admin   heparin lock flush 100 UNIT/ML injection            heparin lock flush 100 unit/mL  500 Units Intravenous Once Michaelyn Barter, MD       sodium chloride flush (NS) 0.9 % injection 10 mL  10 mL Intravenous Once Michaelyn Barter, MD        Allergies:  Allergies  Allergen Reactions   Diclofenac Hives   Lisinopril     cough   Sulfa Antibiotics     ? Allergy    Past Medical History:  Past Medical History:  Diagnosis Date   Anemia    Anxiety    Arthritis    COVID-19    03/2021   Hypertension    Pneumonia    Pre-diabetes    Prediabetes    Sciatica    Past Surgical History:  Past Surgical History:  Procedure Laterality Date   CESAREAN SECTION     COLONOSCOPY W/ POLYPECTOMY     ECTOPIC PREGNANCY SURGERY     FEMUR SURGERY     left, s/p rod for fracture   IR IMAGING  GUIDED PORT INSERTION  12/11/2021   TUBAL LIGATION     Social History:  Social History   Socioeconomic History   Marital status: Widowed    Spouse name: Not on file   Number of children: Not on file   Years of education: Not on file   Highest education level: Not on file  Occupational History   Not on file  Tobacco Use   Smoking status: Never   Smokeless tobacco: Never  Vaping Use   Vaping status: Never Used  Substance and Sexual Activity   Alcohol use: No   Drug use: Never   Sexual activity: Not Currently  Other Topics Concern   Not on file  Social History Narrative   Lives in [place] but husband died 06/17/2020 motorcycle accident bday 10/19/20 married 47 years   No pets   2 daughters and 3 granddaughters      Work - [employer] retired 09/2019   Diet - regular diet   Exercise - none   Social Determinants of Health   Financial Resource Strain: Low Risk  (12/20/2021)   Overall Financial Resource Strain (CARDIA)    Difficulty of Paying Living Expenses: Not hard at all  Food Insecurity: No  Food Insecurity (12/20/2021)   Hunger Vital Sign    Worried About Running Out of Food in the Last Year: Never true    Ran Out of Food in the Last Year: Never true  Transportation Needs: No Transportation Needs (12/20/2021)   PRAPARE - Administrator, Civil Service (Medical): No    Lack of Transportation (Non-Medical): No  Physical Activity: Not on file  Stress: No Stress Concern Present (12/20/2021)   [employer] of Occupational Health - Occupational Stress Questionnaire    Feeling of Stress : Not at all  Social Connections: Unknown (12/19/2020)   Social Connection and Isolation Panel [NHANES]    Frequency of Communication with Friends and Family: More than three times a week    Frequency of Social Gatherings with Friends and Family: Not on file    Attends Religious Services: Not on file    Active Member of Clubs or Organizations: Not on file    Attends Banker Meetings: Not on file    Marital Status: Not on file  Intimate Partner Violence: Not At Risk (12/20/2021)   Humiliation, Afraid, Rape, and Kick questionnaire    Fear of Current or Ex-Partner: No    Emotionally Abused: No    Physically Abused: No    Sexually Abused: No   Family History:  Family History  Problem Relation Age of Onset   Diabetes Mother    Hypertension Mother    Heart disease Mother    Cancer Mother        breast and stomach   Breast cancer Mother 6   Diabetes Father    Hypertension Father    Heart disease Father    Kidney disease Father     Review of Systems: Constitutional: Doesn't report fevers, chills or abnormal weight loss Eyes: Doesn't report blurriness of vision Ears, nose, mouth, throat, and face: Doesn't report sore throat Respiratory: Doesn't report cough, dyspnea or wheezes Cardiovascular: Doesn't report palpitation, chest discomfort  Gastrointestinal:  Doesn't report nausea, constipation, diarrhea GU: Doesn't report incontinence Skin: Doesn't report skin  rashes Neurological: Per HPI Musculoskeletal: Doesn't report joint pain Behavioral/Psych: Doesn't report anxiety  Physical Exam: Vitals:   11/29/22 0909 11/29/22 0926  BP: 107/70 107/78  Pulse: 88   Resp:  18   Temp: 97.8 F (36.6 C)   SpO2: 100%    KPS: 90. General: Alert, cooperative, pleasant, in no acute distress Head: Normal EENT: No conjunctival injection or scleral icterus.  Lungs: Resp effort normal Cardiac: Regular rate Abdomen: Non-distended abdomen Skin: No rashes cyanosis or petechiae. Extremities: No clubbing or edema  Neurologic Exam: Mental Status: Awake, alert, attentive to examiner. Oriented to self and environment. Language is fluent with intact comprehension.  Cranial Nerves: Visual acuity is grossly normal. Visual fields are full. Extra-ocular movements intact. No ptosis. Face is symmetric Motor: Tone and bulk are normal. Power is full in both arms and legs. Reflexes are symmetric, no pathologic reflexes present.  Sensory: Intact to light touch Gait: Normal.   Labs: I have reviewed the data as listed    Component Value Date/Time   NA 137 11/25/2022 0911   K 3.9 11/25/2022 0911   CL 110 11/25/2022 0911   CO2 20 (L) 11/25/2022 0911   GLUCOSE 103 (H) 11/25/2022 0911   BUN 29 (H) 11/25/2022 0911   CREATININE 1.35 (H) 11/25/2022 0911   CALCIUM 8.8 (L) 11/25/2022 0911   PROT 6.4 (L) 11/25/2022 0911   ALBUMIN 3.4 (L) 11/25/2022 0911   AST 17 11/25/2022 0911   ALT 16 11/25/2022 0911   ALKPHOS 340 (H) 11/25/2022 0911   BILITOT 0.3 11/25/2022 0911   GFRNONAA 42 (L) 11/25/2022 0911   Lab Results  Component Value Date   WBC 5.1 11/25/2022   NEUTROABS 3.8 11/25/2022   HGB 8.8 (L) 11/25/2022   HCT 27.8 (L) 11/25/2022   MCV 105.3 (H) 11/25/2022   PLT 141 (L) 11/25/2022    Imaging:  MR HIP LEFT WO CONTRAST  Result Date: 11/21/2022 CLINICAL DATA:  Left hip pain for the past 2 months. No injury. History of lung cancer. EXAM: MR OF THE LEFT HIP  WITHOUT CONTRAST TECHNIQUE: Multiplanar, multisequence MR imaging was performed. No intravenous contrast was administered. COMPARISON:  CT chest, abdomen, and pelvis dated Sep 10, 2022. MRI pelvis dated November 27, 2021. FINDINGS: Bones: Metastasis involving the left femoral head, neck, and intertrochanteric region is new since last August. Subacute to chronic pathologic fracture of the lesser trochanter is unchanged since recent CT from May, but progressed since CT from February. Mild soft tissue swelling around the left intertrochanteric femur. Left femoral intramedullary rod remains in place. Diffuse marrow signal abnormality involving the right ilium, ischium, and sacral ala demonstrates decreased T2 signal compared to prior MRI. Unchanged chronic pathologic fracture of the right acetabulum. Decreased T2 signal associated with the left sacral ala metastasis. Right-sided metastasis in the L3 vertebral body with associated mild pathologic compression fracture, not significantly changed compared to recent CT. Articular cartilage and labrum Articular cartilage:  Mild bilateral hip degenerative changes. Labrum: Grossly intact, although evaluation is limited due to lack of intra-articular fluid. No paralabral abnormality. Joint or bursal effusion Joint effusion: No significant hip joint effusion. Bursae: No focal periarticular fluid collection. Muscles and tendons Muscles and tendons: The visualized gluteus, hamstring and iliopsoas tendons appear normal. Other findings Miscellaneous: The visualized internal pelvic contents appear unremarkable. IMPRESSION: 1. Metastasis involving the left femoral head, neck, and intertrochanteric region is new since last August. Subacute to chronic pathologic fracture of the lesser trochanter is unchanged since recent CT from May, but progressed since CT from February. No definite intertrochanteric or femoral neck fracture at this time. 2. Diffuse marrow signal abnormality involving the  right ilium, ischium, and sacral alas demonstrates decreased  T2 signal compared to prior MRI, likely reflecting treatment response. 3. Unchanged chronic pathologic fracture of the right acetabulum. 4. Right-sided metastasis in the L3 vertebral body with associated mild pathologic compression fracture is not significantly changed compared to recent CT. Electronically Signed   By: Obie Dredge M.D.   On: 11/21/2022 10:02    [facility] Clinician Interpretation: I have personally reviewed the radiological images as listed.  My interpretation, in the context of the patient's clinical presentation, is treatment effect vs true progression   Assessment/Plan Metastasis to brain [facility])  Rebecka Apley presents with progression of treated left frontal calvarial metastasis.  Pattern of progression includes local leptomeningeal enhancement.  Etiology is either reactive change or organic progression of disease into leptomeningeal space.  No symptoms that would localize to spine or nerve roots.  There is an additional small left temporal metastasis that remains untreated.    For now will recommend close imaging surveillance.  Will defer further workup including mets screening spine MRI and LP.  No need for steroids.  She is agreeable with this plan.  We spent twenty additional minutes teaching regarding the natural history, biology, and historical experience in the treatment of neurologic complications of cancer.   We appreciate the opportunity to participate in the care of ANASTASIYA BURTNER.   We ask that KATEY ARCH return to clinic in 1 months following next brain MRI, or sooner as needed.  All questions were answered. The patient knows to call the clinic with any problems, questions or concerns. No barriers to learning were detected.  The total time spent in the encounter was 40 minutes and more than 50% was on counseling and review of test results   Henreitta Leber, MD Medical Director of  Neuro-Oncology [facility] 11/29/22 9:04 AM

## 2022-11-30 ENCOUNTER — Other Ambulatory Visit: Payer: Self-pay

## 2022-12-02 ENCOUNTER — Inpatient Hospital Stay: Payer: PPO

## 2022-12-03 ENCOUNTER — Encounter: Payer: Self-pay | Admitting: Internal Medicine

## 2022-12-03 DIAGNOSIS — M25552 Pain in left hip: Secondary | ICD-10-CM | POA: Insufficient documentation

## 2022-12-04 ENCOUNTER — Encounter: Payer: Self-pay | Admitting: Internal Medicine

## 2022-12-04 ENCOUNTER — Other Ambulatory Visit: Payer: Self-pay | Admitting: *Deleted

## 2022-12-04 ENCOUNTER — Ambulatory Visit
Admission: RE | Admit: 2022-12-04 | Discharge: 2022-12-04 | Disposition: A | Discharge status: Home / Self Care | Payer: PPO | Source: Ambulatory Visit | Attending: Radiation Oncology | Admitting: Radiation Oncology

## 2022-12-04 DIAGNOSIS — Z510 Encounter for antineoplastic radiation therapy: Secondary | ICD-10-CM | POA: Insufficient documentation

## 2022-12-04 DIAGNOSIS — C7931 Secondary malignant neoplasm of brain: Secondary | ICD-10-CM | POA: Insufficient documentation

## 2022-12-04 DIAGNOSIS — C7951 Secondary malignant neoplasm of bone: Secondary | ICD-10-CM | POA: Insufficient documentation

## 2022-12-04 DIAGNOSIS — Z5111 Encounter for antineoplastic chemotherapy: Secondary | ICD-10-CM | POA: Diagnosis not present

## 2022-12-04 DIAGNOSIS — C3412 Malignant neoplasm of upper lobe, left bronchus or lung: Secondary | ICD-10-CM | POA: Insufficient documentation

## 2022-12-04 DIAGNOSIS — C3490 Malignant neoplasm of unspecified part of unspecified bronchus or lung: Secondary | ICD-10-CM

## 2022-12-04 NOTE — Addendum Note (Signed)
Addended byMichaelyn Barter on: 12/04/2022 03:35 PM   Modules accepted: Orders

## 2022-12-04 NOTE — Progress Notes (Signed)
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 female presents to the clinic today for evaluation of metastatic disease to her left hip and patient with known stage IV adenocarcinoma of the lung previously treated to her brain as well as her right hip.  REFERRING PROVIDER: Dana Allan, MD  HPI: Patient is a 72 year old female previously treated to her brain as well as her right hip for metastatic stage IV adenocarcinoma.  We treated partial brain irradiation.  Recent MRI scan showed bulky left frontal necrotic calvarial metastasis slightly increased in size 3.1 from previous 2.8 cm.  Also redemonstrating intracranial extension with increased extent of left frontal dural thickening.  She initially had leptomeningeal disease.  She is being followed by Dr. Barbaraann Cao who is decided to continue to observe this.  She was previously treated to her right hip for metastatic disease with good result no pain in that area she is having increasing pain in the left hip and recent MRI scan showed metastatic disease involving the left femoral head and neck and enteroenteric region new since August.  She also has a right sided metastasis in the L3 vertebral body with mild pathologic compression.  She is able to ambulate.  She is having no focal neurologic deficits.  She is referred to radiation oncology for consideration of palliation to her left hip.  COMPLICATIONS OF TREATMENT: none  FOLLOW UP COMPLIANCE: keeps appointments   PHYSICAL EXAM:  There were no vitals taken for this visit.  Range of motion of her lower extremities does not elicit pain motor and sensory levels are equal and symmetric.  Proprioception is intact.  Deep palpation of her lower spine does not elicit pain. Well-developed well-nourished patient in NAD. HEENT reveals PERLA, EOMI, discs not visualized.  Oral cavity is clear. No  oral mucosal lesions are identified. Neck is clear without evidence of cervical or supraclavicular adenopathy. Lungs are clear to A&P. Cardiac examination is essentially unremarkable with regular rate and rhythm without murmur rub or thrill. Abdomen is benign with no organomegaly or masses noted. Motor sensory and DTR levels are equal and symmetric in the upper and lower extremities. Cranial nerves II through XII are grossly intact. Proprioception is intact. No peripheral adenopathy or edema is identified. No motor or sensory levels are noted. Crude visual fields are within normal range.  RADIOLOGY RESULTS: Bone scan ordered  PLAN: At this time based on the previous treatments to her right hip and evidence of widespread metastatic disease have ordered a bone scan for better delineation of areas to treat and possibly include other areas nearby for palliative care.  Would most likely treat with 30 Gray in 10 fractions.  I have set up simulation after her bone scan.  Risks and benefits of treatment clued minor skin reaction fatigue alteration blood counts were all discussed with the patient.  She comprehends my recommendations well.  Will review her bone scan when she comes for simulation.  I would like to take this opportunity to thank you for allowing me to participate in the care of your patient.Carmina Miller, MD

## 2022-12-05 ENCOUNTER — Ambulatory Visit
Admission: RE | Admit: 2022-12-05 | Discharge: 2022-12-05 | Disposition: A | Discharge status: Home / Self Care | Payer: PPO | Source: Ambulatory Visit | Attending: Radiation Oncology | Admitting: Radiation Oncology

## 2022-12-05 DIAGNOSIS — C7931 Secondary malignant neoplasm of brain: Secondary | ICD-10-CM | POA: Insufficient documentation

## 2022-12-05 MED ORDER — TECHNETIUM TC 99M MEDRONATE IV KIT
20.0000 | PACK | Freq: Once | INTRAVENOUS | Status: AC | PRN
  Administered 2022-12-05: 20.14 via INTRAVENOUS

## 2022-12-09 ENCOUNTER — Inpatient Hospital Stay: Payer: PPO

## 2022-12-09 VITALS — BP 114/70 | HR 86 | Temp 97.7°F | Resp 18

## 2022-12-09 DIAGNOSIS — Z95828 Presence of other vascular implants and grafts: Secondary | ICD-10-CM

## 2022-12-09 DIAGNOSIS — C3490 Malignant neoplasm of unspecified part of unspecified bronchus or lung: Secondary | ICD-10-CM

## 2022-12-09 DIAGNOSIS — Z5111 Encounter for antineoplastic chemotherapy: Secondary | ICD-10-CM | POA: Diagnosis not present

## 2022-12-09 LAB — BASIC METABOLIC PANEL - CANCER CENTER ONLY
Anion gap: 6 (ref 5–15)
BUN: 35 mg/dL — ABNORMAL HIGH (ref 8–23)
CO2: 20 mmol/L — ABNORMAL LOW (ref 22–32)
Calcium: 8.4 mg/dL — ABNORMAL LOW (ref 8.9–10.3)
Chloride: 109 mmol/L (ref 98–111)
Creatinine: 1.47 mg/dL — ABNORMAL HIGH (ref 0.44–1.00)
GFR, Estimated: 38 mL/min — ABNORMAL LOW (ref >60)
Glucose, Bld: 107 mg/dL — ABNORMAL HIGH (ref 70–99)
Potassium: 3.9 mmol/L (ref 3.5–5.1)
Sodium: 135 mmol/L (ref 135–145)

## 2022-12-09 LAB — CBC WITH DIFFERENTIAL (CANCER CENTER ONLY)
Abs Immature Granulocytes: 0.06 10*3/uL (ref 0.00–0.07)
Basophils Absolute: 0 10*3/uL (ref 0.0–0.1)
Basophils Relative: 1 %
Eosinophils Absolute: 0.1 10*3/uL (ref 0.0–0.5)
Eosinophils Relative: 1 %
HCT: 28.4 % — ABNORMAL LOW (ref 36.0–46.0)
Hemoglobin: 9 g/dL — ABNORMAL LOW (ref 12.0–15.0)
Immature Granulocytes: 1 %
Lymphocytes Relative: 15 %
Lymphs Abs: 0.9 10*3/uL (ref 0.7–4.0)
MCH: 34.6 pg — ABNORMAL HIGH (ref 26.0–34.0)
MCHC: 31.7 g/dL (ref 30.0–36.0)
MCV: 109.2 fL — ABNORMAL HIGH (ref 80.0–100.0)
Monocytes Absolute: 0.6 10*3/uL (ref 0.1–1.0)
Monocytes Relative: 9 %
Neutro Abs: 4.8 10*3/uL (ref 1.7–7.7)
Neutrophils Relative %: 73 %
Platelet Count: 102 10*3/uL — ABNORMAL LOW (ref 150–400)
RBC: 2.6 MIL/uL — ABNORMAL LOW (ref 3.87–5.11)
RDW: 17.6 % — ABNORMAL HIGH (ref 11.5–15.5)
WBC Count: 6.5 10*3/uL (ref 4.0–10.5)
nRBC: 0 % (ref 0.0–0.2)

## 2022-12-09 LAB — SAMPLE TO BLOOD BANK

## 2022-12-09 MED ORDER — HEPARIN SOD (PORK) LOCK FLUSH 100 UNIT/ML IV SOLN
500.0000 [IU] | Freq: Once | INTRAVENOUS | Status: AC
  Administered 2022-12-09: 500 [IU]
  Filled 2022-12-09: qty 5

## 2022-12-09 MED ORDER — SODIUM CHLORIDE 0.9% FLUSH
10.0000 mL | Freq: Once | INTRAVENOUS | Status: AC
  Administered 2022-12-09: 10 mL via INTRAVENOUS
  Filled 2022-12-09: qty 10

## 2022-12-09 MED ORDER — SODIUM CHLORIDE 0.9 % IV SOLN
Freq: Once | INTRAVENOUS | Status: AC
  Filled 2022-12-09: qty 250

## 2022-12-09 NOTE — Progress Notes (Signed)
RN spoke with Victoria Masse, NP. Pt had 2 standing orders for fluids 500 ml/hour vs 999 ml hour. Per v/o NP- release orders for 999 ml since patient tolerated this well at last infusion.

## 2022-12-09 NOTE — Patient Instructions (Addendum)
Your calcium is low. Lauren, NP would like you to start taking tums daily. This is available over the counter.

## 2022-12-10 ENCOUNTER — Other Ambulatory Visit: Payer: Self-pay

## 2022-12-10 ENCOUNTER — Ambulatory Visit
Admission: RE | Admit: 2022-12-10 | Discharge: 2022-12-10 | Disposition: A | Discharge status: Home / Self Care | Payer: PPO | Source: Ambulatory Visit | Attending: Student in an Organized Health Care Education/Training Program | Admitting: Student in an Organized Health Care Education/Training Program

## 2022-12-10 ENCOUNTER — Ambulatory Visit: Payer: PPO

## 2022-12-10 DIAGNOSIS — C3492 Malignant neoplasm of unspecified part of left bronchus or lung: Secondary | ICD-10-CM | POA: Diagnosis present

## 2022-12-10 DIAGNOSIS — C3490 Malignant neoplasm of unspecified part of unspecified bronchus or lung: Secondary | ICD-10-CM | POA: Insufficient documentation

## 2022-12-11 ENCOUNTER — Ambulatory Visit
Admission: RE | Admit: 2022-12-11 | Discharge: 2022-12-11 | Disposition: A | Discharge status: Home / Self Care | Payer: PPO | Source: Ambulatory Visit | Attending: Radiation Oncology | Admitting: Radiation Oncology

## 2022-12-11 DIAGNOSIS — Z5111 Encounter for antineoplastic chemotherapy: Secondary | ICD-10-CM | POA: Diagnosis not present

## 2022-12-12 ENCOUNTER — Encounter: Payer: PPO | Admitting: Family Medicine

## 2022-12-12 DIAGNOSIS — Z5111 Encounter for antineoplastic chemotherapy: Secondary | ICD-10-CM | POA: Diagnosis not present

## 2022-12-13 ENCOUNTER — Encounter: Payer: Self-pay | Admitting: Internal Medicine

## 2022-12-16 ENCOUNTER — Encounter (HOSPITAL_COMMUNITY): Payer: Self-pay | Admitting: Pulmonary Disease

## 2022-12-16 ENCOUNTER — Ambulatory Visit: Payer: PPO

## 2022-12-16 ENCOUNTER — Other Ambulatory Visit: Payer: Self-pay | Admitting: Internal Medicine

## 2022-12-16 ENCOUNTER — Other Ambulatory Visit: Payer: Self-pay

## 2022-12-16 DIAGNOSIS — C3490 Malignant neoplasm of unspecified part of unspecified bronchus or lung: Secondary | ICD-10-CM | POA: Insufficient documentation

## 2022-12-16 NOTE — Progress Notes (Addendum)
Ms Aubut denies chest pain or shortness of breath. Patient denies s/s of Covid, patient was exposed to Covid I a week ago, patient did a home test- it was negative.  Ms Lyerly denies  any s/s of upper or lower respiratory infection in the past 8 weeks.  Ms Trias reports that she gets mouth sores after chemo, they last around 1 week, it has been over 2 weeks since last treatment, she no longer has mouth sores. Ms Cupit 's PCP is Dr. Dana Allan. I spoke with Dr. Desmond Lope, patient will not need to be tested unless she has symptoms, which patient denied.

## 2022-12-17 ENCOUNTER — Encounter (HOSPITAL_COMMUNITY)
Admission: RE | Disposition: A | Discharge status: Home / Self Care | Payer: Self-pay | Source: Home / Self Care | Attending: Pulmonary Disease

## 2022-12-17 ENCOUNTER — Ambulatory Visit: Payer: PPO

## 2022-12-17 ENCOUNTER — Ambulatory Visit (HOSPITAL_COMMUNITY): Payer: PPO

## 2022-12-17 ENCOUNTER — Ambulatory Visit (HOSPITAL_BASED_OUTPATIENT_CLINIC_OR_DEPARTMENT_OTHER): Payer: PPO | Admitting: Certified Registered Nurse Anesthetist

## 2022-12-17 ENCOUNTER — Encounter (HOSPITAL_COMMUNITY): Payer: Self-pay | Admitting: Pulmonary Disease

## 2022-12-17 ENCOUNTER — Inpatient Hospital Stay: Payer: PPO

## 2022-12-17 ENCOUNTER — Ambulatory Visit (HOSPITAL_COMMUNITY)
Admission: RE | Admit: 2022-12-17 | Discharge: 2022-12-17 | Disposition: A | Discharge status: Home / Self Care | Payer: PPO | Attending: Pulmonary Disease | Admitting: Pulmonary Disease

## 2022-12-17 ENCOUNTER — Other Ambulatory Visit: Payer: Self-pay

## 2022-12-17 ENCOUNTER — Ambulatory Visit: Admission: RE | Admit: 2022-12-17 | Payer: PPO | Source: Ambulatory Visit

## 2022-12-17 ENCOUNTER — Ambulatory Visit (HOSPITAL_COMMUNITY): Payer: PPO | Admitting: Certified Registered Nurse Anesthetist

## 2022-12-17 DIAGNOSIS — C3492 Malignant neoplasm of unspecified part of left bronchus or lung: Secondary | ICD-10-CM

## 2022-12-17 DIAGNOSIS — C3412 Malignant neoplasm of upper lobe, left bronchus or lung: Secondary | ICD-10-CM | POA: Diagnosis not present

## 2022-12-17 DIAGNOSIS — N189 Chronic kidney disease, unspecified: Secondary | ICD-10-CM | POA: Insufficient documentation

## 2022-12-17 DIAGNOSIS — R911 Solitary pulmonary nodule: Secondary | ICD-10-CM | POA: Diagnosis present

## 2022-12-17 DIAGNOSIS — I4711 Inappropriate sinus tachycardia, so stated: Secondary | ICD-10-CM | POA: Diagnosis not present

## 2022-12-17 DIAGNOSIS — I10 Essential (primary) hypertension: Secondary | ICD-10-CM | POA: Diagnosis not present

## 2022-12-17 DIAGNOSIS — Z79899 Other long term (current) drug therapy: Secondary | ICD-10-CM | POA: Diagnosis not present

## 2022-12-17 DIAGNOSIS — I129 Hypertensive chronic kidney disease with stage 1 through stage 4 chronic kidney disease, or unspecified chronic kidney disease: Secondary | ICD-10-CM | POA: Diagnosis not present

## 2022-12-17 DIAGNOSIS — Z5111 Encounter for antineoplastic chemotherapy: Secondary | ICD-10-CM | POA: Diagnosis not present

## 2022-12-17 DIAGNOSIS — C7931 Secondary malignant neoplasm of brain: Secondary | ICD-10-CM | POA: Diagnosis not present

## 2022-12-17 DIAGNOSIS — C3490 Malignant neoplasm of unspecified part of unspecified bronchus or lung: Secondary | ICD-10-CM | POA: Diagnosis present

## 2022-12-17 HISTORY — PX: BRONCHIAL BIOPSY: SHX5109

## 2022-12-17 HISTORY — DX: Malignant (primary) neoplasm, unspecified: C80.1

## 2022-12-17 HISTORY — DX: Chronic kidney disease, unspecified: N18.9

## 2022-12-17 HISTORY — PX: BRONCHIAL NEEDLE ASPIRATION BIOPSY: SHX5106

## 2022-12-17 SURGERY — BRONCHOSCOPY, WITH BIOPSY USING ELECTROMAGNETIC NAVIGATION
Anesthesia: General | Laterality: Bilateral

## 2022-12-17 MED ORDER — PROPOFOL 500 MG/50ML IV EMUL
INTRAVENOUS | Status: DC | PRN
Start: 2022-12-17 — End: 2022-12-17
  Administered 2022-12-17: 75 ug/kg/min via INTRAVENOUS

## 2022-12-17 MED ORDER — CHLORHEXIDINE GLUCONATE 0.12 % MT SOLN: 15.0000 mL | Freq: Once | OROMUCOSAL | Status: AC

## 2022-12-17 MED ORDER — SUGAMMADEX SODIUM 200 MG/2ML IV SOLN
INTRAVENOUS | Status: DC | PRN
  Administered 2022-12-17: 200 mg via INTRAVENOUS

## 2022-12-17 MED ORDER — CHLORHEXIDINE GLUCONATE 0.12 % MT SOLN
OROMUCOSAL | Status: AC
  Administered 2022-12-17: 15 mL via OROMUCOSAL
  Filled 2022-12-17: qty 15

## 2022-12-17 MED ORDER — FENTANYL CITRATE (PF) 100 MCG/2ML IJ SOLN: 25.0000 ug | INTRAMUSCULAR | Status: DC | PRN

## 2022-12-17 MED ORDER — ROCURONIUM BROMIDE 10 MG/ML (PF) SYRINGE
PREFILLED_SYRINGE | INTRAVENOUS | Status: DC | PRN
  Administered 2022-12-17: 50 mg via INTRAVENOUS

## 2022-12-17 MED ORDER — DEXAMETHASONE SODIUM PHOSPHATE 10 MG/ML IJ SOLN
INTRAMUSCULAR | Status: DC | PRN
  Administered 2022-12-17: 10 mg via INTRAVENOUS

## 2022-12-17 MED ORDER — PROPOFOL 10 MG/ML IV BOLUS
INTRAVENOUS | Status: DC | PRN
  Administered 2022-12-17: 130 mg via INTRAVENOUS

## 2022-12-17 MED ORDER — ONDANSETRON HCL 4 MG/2ML IJ SOLN: 4.0000 mg | Freq: Once | INTRAMUSCULAR | Status: DC | PRN

## 2022-12-17 MED ORDER — FENTANYL CITRATE (PF) 100 MCG/2ML IJ SOLN
INTRAMUSCULAR | Status: DC | PRN
Start: 2022-12-17 — End: 2022-12-17
  Administered 2022-12-17: 50 ug via INTRAVENOUS

## 2022-12-17 MED ORDER — LIDOCAINE 2% (20 MG/ML) 5 ML SYRINGE
INTRAMUSCULAR | Status: DC | PRN
  Administered 2022-12-17: 80 mg via INTRAVENOUS

## 2022-12-17 MED ORDER — ACETAMINOPHEN 500 MG PO TABS
1000.0000 mg | ORAL_TABLET | Freq: Once | ORAL | Status: AC
  Administered 2022-12-17: 1000 mg via ORAL
  Filled 2022-12-17: qty 2

## 2022-12-17 MED ORDER — LACTATED RINGERS IV SOLN: INTRAVENOUS | Status: DC

## 2022-12-17 MED ORDER — ONDANSETRON HCL 4 MG/2ML IJ SOLN
INTRAMUSCULAR | Status: DC | PRN
  Administered 2022-12-17: 4 mg via INTRAVENOUS

## 2022-12-17 NOTE — Discharge Instructions (Signed)
Flexible Bronchoscopy, Care After This sheet gives you information about how to care for yourself after your test. Your doctor may also give you more specific instructions. If you have problems or questions, contact your doctor. Follow these instructions at home: Eating and drinking Do not eat or drink anything (not even water) for 2 hours after your test, or until your numbing medicine (local anesthetic) wears off. When your numbness is gone and your cough and gag reflexes have come back, you may: Eat only soft foods. Slowly drink liquids. The day after the test, go back to your normal diet. Driving Do not drive for 24 hours if you were given a medicine to help you relax (sedative). Do not drive or use heavy machinery while taking prescription pain medicine. General instructions  Take over-the-counter and prescription medicines only as told by your doctor. Return to your normal activities as told. Ask what activities are safe for you. Do not use any products that have nicotine or tobacco in them. This includes cigarettes and e-cigarettes. If you need help quitting, ask your doctor. Keep all follow-up visits as told by your doctor. This is important. It is very important if you had a tissue sample (biopsy) taken. Get help right away if: You have shortness of breath that gets worse. You get light-headed. You feel like you are going to pass out (faint). You have chest pain. You cough up: More than a little blood. More blood than before. Summary Do not eat or drink anything (not even water) for 2 hours after your test, or until your numbing medicine wears off. Do not use cigarettes. Do not use e-cigarettes. Get help right away if you have chest pain.  This information is not intended to replace advice given to you by your health care provider. Make sure you discuss any questions you have with your health care provider. Document Released: 02/03/2009 Document Revised: 03/21/2017 Document  Reviewed: 04/26/2016 Elsevier Patient Education  2020 Elsevier Inc.  

## 2022-12-17 NOTE — Anesthesia Procedure Notes (Signed)
Procedure Name: Intubation Date/Time: 12/17/2022 10:37 AM  Performed by: Collene Schlichter, MDPre-anesthesia Checklist: Patient identified, Emergency Drugs available, Suction available and Patient being monitored Patient Re-evaluated:Patient Re-evaluated prior to induction Oxygen Delivery Method: Circle system utilized Preoxygenation: Pre-oxygenation with 100% oxygen Induction Type: IV induction Ventilation: Mask ventilation without difficulty Laryngoscope Size: Mac and 3 Grade View: Grade I Tube type: Oral Number of attempts: 1 Airway Equipment and Method: Stylet and Oral airway Placement Confirmation: ETT inserted through vocal cords under direct vision, positive ETCO2 and breath sounds checked- equal and bilateral Secured at: 21 cm Tube secured with: Tape Dental Injury: Teeth and Oropharynx as per pre-operative assessment  Comments: DL x1 by Christeen Douglas with G2 view, unable to pass ETT with Mac 3. Dr. Desmond Lope DL x1 G1 view with Mac 3 blade and successful intubation.

## 2022-12-17 NOTE — Transfer of Care (Signed)
Immediate Anesthesia Transfer of Care Note  Patient: ISLA BAILS  Procedure(s) Performed: ROBOTIC ASSISTED NAVIGATIONAL BRONCHOSCOPY (Bilateral) BRONCHIAL BIOPSIES BRONCHIAL NEEDLE ASPIRATION BIOPSIES  Patient Location: PACU  Anesthesia Type:General  Level of Consciousness: awake and patient cooperative  Airway & Oxygen Therapy: Patient Spontanous Breathing and Patient connected to nasal cannula oxygen  Post-op Assessment: Report given to RN and Post -op Vital signs reviewed and stable  Post vital signs: Reviewed and stable  Last Vitals:  Vitals Value Taken Time  BP 120/76 12/17/22 1130  Temp 36.4 C 12/17/22 1130  Pulse 85 12/17/22 1131  Resp 12 12/17/22 1131  SpO2 100 % 12/17/22 1131  Vitals shown include unfiled device data.  Last Pain:  Vitals:   12/17/22 0831  TempSrc:   PainSc: 0-No pain         Complications: No notable events documented.

## 2022-12-17 NOTE — Interval H&P Note (Signed)
History and Physical Interval Note:  12/17/2022 10:23 AM  Victoria Foley  has presented today for surgery, with the diagnosis of lung adenocarcinoma.  The various methods of treatment have been discussed with the patient and family. After consideration of risks, benefits and other options for treatment, the patient has consented to  Procedure(s): ROBOTIC ASSISTED NAVIGATIONAL BRONCHOSCOPY (Bilateral) as a surgical intervention.  The patient's history has been reviewed, patient examined, no change in status, stable for surgery.  I have reviewed the patient's chart and labs.  Questions were answered to the patient's satisfaction.     Rachel Bo Shawntae Lowy

## 2022-12-17 NOTE — H&P (Signed)
Synopsis: Referred in Aug 2024 for lung mass by [name], MD  Subjective:   PATIENT ID: Victoria Foley GENDER: female DOB: 1950-11-28, MRN: [id number]    72yo FM, stage 4 adenocarcinoma of the lung. Here today for requested repeat biopsy to look for tissue molecular evaluation. Overall, she is doing well. Not on any blood thinners and has had bronchoscopy before.     Oncology History  Primary lung adenocarcinoma (HCC)  10/10/2021 Initial Diagnosis   Primary lung adenocarcinoma (HCC) Stage IV  Patient seen by PCP for wheezing for 2 weeks. Imaging with CXR followed by CT chest showed Spiculated 2.1 x 2.2 cm left upper lobe pulmonary nodule with associated pleural tethering. Innumerable subcentimeter pulmonary nodules throughout the lungs.      11/14/2021 PET scan   IMPRESSION: 1. Left suprahilar nodule with maximum SUV 4.5, and a more distal 2.2 cm left upper lobe nodule with maximum SUV of 5.5. 2. Innumerable scattered small pulmonary nodules, most of which are under 5 mm in diameter, throughout both lungs. Some are cavitary. Possibilities include cavitating hematogenous disseminated malignancy versus infectious etiology subset as septic emboli. Most of the nodules are stable, some are minimally enlarged compared to 10/10/2021. 3. Abnormal mild permeative type bony findings in the right hemipelvis associated with substantially accentuated metabolic activity. Possible associated pathologic fracture through the right quadrilateral plate and right acetabulum. The findings involve the right ilium and ischium but also the right lateral sacral ala and a small focus in the left sacrum.    Pathology Results   She had transbronchial biopsies by Dr. Jayme Cloud on 11/19/21.  Pathology from LUL nodule showed adenocarcinoma, consistent with lung primary.    11/19/2021 Cancer Staging   Staging form: Lung, AJCC 8th Edition - Clinical stage from 11/19/2021: Stage IVB (cT1c, cM1c) -  Signed by Michaelyn Barter, MD on 11/27/2021 Stage prefix: Initial diagnosis   11/27/2021 Imaging   MRI pelvis  IMPRESSION: 1. Abnormal marrow signal throughout the right ilium involving the right acetabulum, right superior pubic ramus and right inferior pubic ramus, right side of the sacrum and a smaller focus of abnormal marrow lesion in the left side of the sacrum. Findings are concerning for metastatic disease. 2. Nondisplaced pathologic fracture of the quadrilateral plate of the right acetabulum. 3. Mild muscle edema in the right gluteus minimus and medius muscles likely reflecting mild muscle strain.  MRI brain  IMPRESSION: No evidence of intracranial metastatic disease.   Indeterminate 1 cm left frontal calvarium lesion.   12/18/2021 - 08/27/2022 Chemotherapy   Patient is on Treatment Plan : LUNG Carboplatin (5) + Pemetrexed (500) + Pembrolizumab (200) D1 q21d Induction x 4 cycles / Maintenance Pemetrexed (500) + Pembrolizumab (200) D1 q21d     10/07/2022 -  Chemotherapy   Patient is on Treatment Plan : LUNG Carboplatin + Paclitaxel q21d Dose Reduction     Primary malignant neoplasm of left upper lobe of lung (HCC)  06/04/2022 Initial Diagnosis   Primary malignant neoplasm of left upper lobe of lung (HCC)   06/04/2022 Cancer Staging   Staging form: Lung, AJCC 8th Edition - Pathologic: Stage IVB (pT1c, pNX, cM1c) - Signed by Earna Coder, MD on 06/04/2022      Past Medical History:  Diagnosis Date   Anemia    Anxiety    Arthritis    Cancer (HCC)    Lung, 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  COVID-19    03/2021   Hypertension    Pneumonia    age 46   Pre-diabetes    Prediabetes    Sciatica      Family History  Problem Relation Age of Onset   Diabetes Mother    Hypertension Mother    Heart disease Mother    Cancer Mother        breast and stomach   Breast cancer Mother 23   Diabetes Father     Hypertension Father    Heart disease Father    Kidney disease Father      Past Surgical History:  Procedure Laterality Date   CESAREAN SECTION     COLONOSCOPY W/ POLYPECTOMY     ECTOPIC PREGNANCY SURGERY     FEMUR SURGERY     left, s/p rod for fracture   IR IMAGING GUIDED PORT INSERTION  12/11/2021   TUBAL LIGATION      Social History   Socioeconomic History   Marital status: Widowed    Spouse name: Not on file   Number of children: Not on file   Years of education: Not on file   Highest education level: Not on file  Occupational History   Not on file  Tobacco Use   Smoking status: Never   Smokeless tobacco: Never  Vaping Use   Vaping status: Never Used  Substance and Sexual Activity   Alcohol use: Never   Drug use: Never   Sexual activity: Not Currently  Other Topics Concern   Not on file  Social History Narrative   Lives in [place] but husband died 2020/06/03 motorcycle accident bday 10/19/20 married 47 years   No pets   2 daughters and 3 granddaughters      Work - [employer] retired 09/2019   Diet - regular diet   Exercise - none   Social Determinants of Health   Financial Resource Strain: Low Risk  (12/20/2021)   Overall Financial Resource Strain (CARDIA)    Difficulty of Paying Living Expenses: Not hard at all  Food Insecurity: No Food Insecurity (12/20/2021)   Hunger Vital Sign    Worried About Running Out of Food in the Last Year: Never true    Ran Out of Food in the Last Year: Never true  Transportation Needs: No Transportation Needs (12/20/2021)   PRAPARE - Administrator, Civil Service (Medical): No    Lack of Transportation (Non-Medical): No  Physical Activity: Not on file  Stress: No Stress Concern Present (12/20/2021)   [employer] of Occupational Health - Occupational Stress Questionnaire    Feeling of Stress : Not at all  Social Connections: Unknown (12/19/2020)   Social Connection and Isolation Panel [NHANES]     Frequency of Communication with Friends and Family: More than three times a week    Frequency of Social Gatherings with Friends and Family: Not on file    Attends Religious Services: Not on file    Active Member of Clubs or Organizations: Not on file    Attends Banker Meetings: Not on file    Marital Status: Not on file  Intimate Partner Violence: Not At Risk (12/20/2021)   Humiliation, Afraid, Rape, and Kick questionnaire    Fear of Current or Ex-Partner: No    Emotionally Abused: No    Physically Abused: No    Sexually Abused: No     Allergies  Allergen Reactions   Diclofenac Hives   Lisinopril  cough   Sulfa Antibiotics     ? Allergy      @ENCMEDSTART @  Review of Systems  Constitutional:  Negative for chills, fever, malaise/fatigue and weight loss.  HENT:  Negative for hearing loss, sore throat and tinnitus.   Eyes:  Negative for blurred vision and double vision.  Respiratory:  Negative for cough, hemoptysis, sputum production, shortness of breath, wheezing and stridor.   Cardiovascular:  Negative for chest pain, palpitations, orthopnea, leg swelling and PND.  Gastrointestinal:  Negative for abdominal pain, constipation, diarrhea, heartburn, nausea and vomiting.  Genitourinary:  Negative for dysuria, hematuria and urgency.  Musculoskeletal:  Negative for joint pain and myalgias.  Skin:  Negative for itching and rash.  Neurological:  Negative for dizziness, tingling, weakness and headaches.  Endo/Heme/Allergies:  Negative for environmental allergies. Does not bruise/bleed easily.  Psychiatric/Behavioral:  Negative for depression. The patient is not nervous/anxious and does not have insomnia.   All other systems reviewed and are negative.    Objective:  Physical Exam Vitals reviewed.  Constitutional:      General: She is not in acute distress.    Appearance: She is well-developed.  HENT:     Head: Normocephalic and atraumatic.  Eyes:     General:  No scleral icterus.    Conjunctiva/sclera: Conjunctivae normal.     Pupils: Pupils are equal, round, and reactive to light.  Neck:     Vascular: No JVD.     Trachea: No tracheal deviation.  Cardiovascular:     Rate and Rhythm: Normal rate and regular rhythm.     Heart sounds: Normal heart sounds. No murmur heard. Pulmonary:     Effort: Pulmonary effort is normal. No tachypnea, accessory muscle usage or respiratory distress.     Breath sounds: No stridor. No wheezing, rhonchi or rales.  Abdominal:     General: There is no distension.     Palpations: Abdomen is soft.     Tenderness: There is no abdominal tenderness.  Musculoskeletal:        General: No tenderness.     Cervical back: Neck supple.  Lymphadenopathy:     Cervical: No cervical adenopathy.  Skin:    General: Skin is warm and dry.     Capillary Refill: Capillary refill takes less than 2 seconds.     Findings: No rash.  Neurological:     Mental Status: She is alert and oriented to person, place, and time.  Psychiatric:        Behavior: Behavior normal.      Vitals:   12/17/22 0815  BP: 126/72  Pulse: 83  Resp: 18  Temp: 97.8 F (36.6 C)  TempSrc: Oral  SpO2: 100%  Weight: 61.7 kg  Height: 4\' 11"  (1.499 m)   100% on RA BMI Readings from Last 3 Encounters:  12/17/22 27.47 kg/m  11/29/22 28.11 kg/m  11/25/22 23.63 kg/m   Wt Readings from Last 3 Encounters:  12/17/22 61.7 kg  11/29/22 63.1 kg  11/25/22 53.1 kg     CBC    Component Value Date/Time   WBC 6.5 12/09/2022 1314   WBC 7.7 11/18/2022 0911   RBC 2.60 (L) 12/09/2022 1314   HGB 9.0 (L) 12/09/2022 1314   HCT 28.4 (L) 12/09/2022 1314   PLT 102 (L) 12/09/2022 1314   MCV 109.2 (H) 12/09/2022 1314   MCH 34.6 (H) 12/09/2022 1314   MCHC 31.7 12/09/2022 1314   RDW 17.6 (H) 12/09/2022 1314   LYMPHSABS 0.9 12/09/2022 1314  MONOABS 0.6 12/09/2022 1314   EOSABS 0.1 12/09/2022 1314   BASOSABS 0.0 12/09/2022 1314      Chest Imaging: CT  chest:  lUL lung mass The patient's images have been independently reviewed by me.    Pulmonary Functions Testing Results:     No data to display          FeNO:   Pathology:   Echocardiogram:   Heart Catheterization:     Assessment & Plan:   Lung mass Stage 4 lung cancer  Discussion:  Here today for repeat bronchoscopy and tissue sampling at request of oncology Planned RAB today Discussed R/B/A and patient is agreeable to proceed  Josephine Igo, DO [facility] 12/17/2022 10:20 AM

## 2022-12-17 NOTE — Progress Notes (Signed)
Patient placed in phase 2, awaiting CXR results before discharge.

## 2022-12-17 NOTE — Anesthesia Preprocedure Evaluation (Addendum)
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 on medications and Pt. on home beta blockers Normal cardiovascular exam Rhythm:Regular Rate:Normal     Neuro/Psych  PSYCHIATRIC DISORDERS Anxiety      Neuromuscular disease    GI/Hepatic negative GI ROS, Neg liver ROS,,,  Endo/Other  negative endocrine ROS    Renal/GU Renal InsufficiencyRenal disease     Musculoskeletal  (+) Arthritis ,    Abdominal   Peds  Hematology  (+) Blood dyscrasia (Thrombocytopenia), anemia   Anesthesia Other Findings Day of surgery medications reviewed with the patient.  Reproductive/Obstetrics                             Anesthesia Physical Anesthesia Plan  ASA: 4  Anesthesia Plan: General   Post-op Pain Management: Tylenol PO (pre-op)*   Induction: Intravenous  PONV Risk Score and Plan: 3 and Dexamethasone and Ondansetron  Airway Management Planned: Oral ETT  Additional Equipment:   Intra-op Plan:   Post-operative Plan: Extubation in OR  Informed Consent: I have reviewed the patients History and Physical, chart, labs and discussed the procedure including the risks, benefits and alternatives for the proposed anesthesia with the patient or authorized representative who has indicated his/her understanding and acceptance.     Dental advisory given  Plan Discussed with: CRNA  Anesthesia Plan Comments:        Anesthesia Quick Evaluation

## 2022-12-17 NOTE — Progress Notes (Signed)
Nutrition Follow-up:  Patient with stage IV lung cancer.  Started on second line carbo/taxol.  S/p bronchoscopy today.  Spoke with patient via phone for nutrition follow-up.  Reports that her appetite is good.  Appetite was decreased following chemotherapy but has improved.  Has been eating all kinds of foods (eggs, sausage, cereal, chicken, vegetables, shrimp).  Has been drinking premier protein shakes at times.    Medications: reviewed  Labs: reviewed  Anthropometrics:   Weight 139 lb 3.2 oz on 8/9 129 lb on 7/29 141 lb on 4/16   NUTRITION DIAGNOSIS: Unintentional weight loss improved since 7/29   INTERVENTION:  Continue high calorie, high protein foods to promote weight maintenance Continue oral nutrition supplement for added nutrition    MONITORING, EVALUATION, GOAL: weight trends, intake   NEXT VISIT: to be determined with treatment   Victoria Foley, RD, LDN Registered Dietitian [phone]

## 2022-12-17 NOTE — Op Note (Signed)
Video Bronchoscopy with Robotic Assisted Bronchoscopic Navigation   Date of Operation: 12/17/2022   Pre-op Diagnosis: Lung nodule   Post-op Diagnosis: lung nodule   Surgeon: Josephine Igo, DO   Assistants: None   Anesthesia: General endotracheal anesthesia  Operation: Flexible video fiberoptic bronchoscopy with robotic assistance and biopsies.  Estimated Blood Loss: Minimal  Complications: None  Indications and History: Victoria Foley is a 72 y.o. female with history of stage 4 lung cancer. The risks, benefits, complications, treatment options and expected outcomes were discussed with the patient.  The possibilities of pneumothorax, pneumonia, reaction to medication, pulmonary aspiration, perforation of a viscus, bleeding, failure to diagnose a condition and creating a complication requiring transfusion or operation were discussed with the patient who freely signed the consent.    Description of Procedure: The patient was seen in the Preoperative Area, was examined and was deemed appropriate to proceed.  The patient was taken to [facility] endoscopy room 3, identified as Victoria Foley and the procedure verified as Flexible Video Fiberoptic Bronchoscopy.  A Time Out was held and the above information confirmed.   Prior to the date of the procedure a high-resolution CT scan of the chest was performed. Utilizing ION software program a virtual tracheobronchial tree was generated to allow the creation of distinct navigation pathways to the patient's parenchymal abnormalities. After being taken to the operating room general anesthesia was initiated and the patient  was orally intubated. The video fiberoptic bronchoscope was introduced via the endotracheal tube and a general inspection was performed which showed normal right and left lung anatomy, aspiration of the bilateral mainstems was completed to remove any remaining secretions. Robotic catheter inserted into patient's endotracheal tube.    Target #1 LUL lung nodule: The distinct navigation pathways prepared prior to this procedure were then utilized to navigate to patient's lesion identified on CT scan. The robotic catheter was secured into place and the vision probe was withdrawn.  Lesion location was approximated using fluoroscopy and 3D CBCT imaging for CT guided needle placement for peripheral targeting. Under fluoroscopic guidance transbronchial needle biopsies, and transbronchial forceps biopsies were performed to be sent for cytology and pathology.   At the end of the procedure a general airway inspection was performed and there was no evidence of active bleeding. The bronchoscope was removed.  The patient tolerated the procedure well. There was no significant blood loss and there were no obvious complications. A post-procedural chest x-ray is pending.  Samples Target #1: 1. Transbronchial Wang needle biopsies from LUL 2. Transbronchial forceps biopsies from LUL  Plans:  The patient will be discharged from the PACU to home when recovered from anesthesia and after chest x-ray is reviewed. We will review the cytology, pathology results with the patient when they become available. Outpatient followup will be with Josephine Igo, DO.  Josephine Igo, DO [facility] 12/17/2022 11:31 AM

## 2022-12-18 ENCOUNTER — Ambulatory Visit
Admission: RE | Admit: 2022-12-18 | Discharge: 2022-12-18 | Disposition: A | Discharge status: Home / Self Care | Payer: PPO | Source: Ambulatory Visit | Attending: Radiation Oncology | Admitting: Radiation Oncology

## 2022-12-18 ENCOUNTER — Other Ambulatory Visit: Payer: Self-pay

## 2022-12-18 DIAGNOSIS — Z5111 Encounter for antineoplastic chemotherapy: Secondary | ICD-10-CM | POA: Diagnosis not present

## 2022-12-18 LAB — RAD ONC ARIA SESSION SUMMARY
Course Elapsed Days: 0
Plan Fractions Treated to Date: 1
Plan Prescribed Dose Per Fraction: 3 Gy
Plan Total Fractions Prescribed: 10
Plan Total Prescribed Dose: 30 Gy
Reference Point Dosage Given to Date: 3 Gy
Reference Point Session Dosage Given: 3 Gy
Session Number: 1

## 2022-12-18 MED FILL — Fosaprepitant Dimeglumine For IV Infusion 150 MG (Base Eq): INTRAVENOUS | Qty: 5 | Status: AC

## 2022-12-18 MED FILL — Dexamethasone Sodium Phosphate Inj 100 MG/10ML: INTRAMUSCULAR | Qty: 1 | Status: AC

## 2022-12-18 NOTE — Anesthesia Postprocedure Evaluation (Signed)
Anesthesia Post Note  Patient: Victoria Foley  Procedure(s) Performed: ROBOTIC ASSISTED NAVIGATIONAL BRONCHOSCOPY (Bilateral) BRONCHIAL BIOPSIES BRONCHIAL NEEDLE ASPIRATION BIOPSIES     Patient location during evaluation: PACU Anesthesia Type: General Level of consciousness: awake and alert Pain management: pain level controlled Vital Signs Assessment: post-procedure vital signs reviewed and stable Respiratory status: spontaneous breathing, nonlabored ventilation, respiratory function stable and patient connected to nasal cannula oxygen Cardiovascular status: blood pressure returned to baseline and stable Postop Assessment: no apparent nausea or vomiting Anesthetic complications: no   No notable events documented.  Last Vitals:  Vitals:   12/17/22 1215 12/17/22 1230  BP: (!) 147/81 (!) 140/81  Pulse: 83 78  Resp: 14 16  Temp:  (!) 36.4 C  SpO2: 100% 100%    Last Pain:  Vitals:   12/17/22 1230  TempSrc:   PainSc: 0-No pain                 Collene Schlichter

## 2022-12-19 ENCOUNTER — Other Ambulatory Visit: Payer: Self-pay

## 2022-12-19 ENCOUNTER — Inpatient Hospital Stay: Payer: PPO

## 2022-12-19 ENCOUNTER — Inpatient Hospital Stay (HOSPITAL_BASED_OUTPATIENT_CLINIC_OR_DEPARTMENT_OTHER): Payer: PPO | Admitting: Internal Medicine

## 2022-12-19 ENCOUNTER — Ambulatory Visit
Admission: RE | Admit: 2022-12-19 | Discharge: 2022-12-19 | Disposition: A | Discharge status: Home / Self Care | Payer: PPO | Source: Ambulatory Visit | Attending: Radiation Oncology | Admitting: Radiation Oncology

## 2022-12-19 ENCOUNTER — Encounter (HOSPITAL_COMMUNITY): Payer: Self-pay | Admitting: Pulmonary Disease

## 2022-12-19 ENCOUNTER — Telehealth: Payer: Self-pay

## 2022-12-19 VITALS — BP 124/69 | HR 79 | Temp 98.1°F | Wt 140.0 lb

## 2022-12-19 DIAGNOSIS — C7931 Secondary malignant neoplasm of brain: Secondary | ICD-10-CM

## 2022-12-19 DIAGNOSIS — C3490 Malignant neoplasm of unspecified part of unspecified bronchus or lung: Secondary | ICD-10-CM

## 2022-12-19 DIAGNOSIS — Z5111 Encounter for antineoplastic chemotherapy: Secondary | ICD-10-CM

## 2022-12-19 DIAGNOSIS — M25552 Pain in left hip: Secondary | ICD-10-CM | POA: Diagnosis not present

## 2022-12-19 DIAGNOSIS — D6481 Anemia due to antineoplastic chemotherapy: Secondary | ICD-10-CM

## 2022-12-19 DIAGNOSIS — T451X5A Adverse effect of antineoplastic and immunosuppressive drugs, initial encounter: Secondary | ICD-10-CM

## 2022-12-19 LAB — RAD ONC ARIA SESSION SUMMARY
Course Elapsed Days: 1
Plan Fractions Treated to Date: 2
Plan Prescribed Dose Per Fraction: 3 Gy
Plan Total Fractions Prescribed: 10
Plan Total Prescribed Dose: 30 Gy
Reference Point Dosage Given to Date: 6 Gy
Reference Point Session Dosage Given: 3 Gy
Session Number: 2

## 2022-12-19 LAB — CMP (CANCER CENTER ONLY)
ALT: 13 U/L (ref 0–44)
AST: 18 U/L (ref 15–41)
Albumin: 3.6 g/dL (ref 3.5–5.0)
Alkaline Phosphatase: 305 U/L — ABNORMAL HIGH (ref 38–126)
Anion gap: 4 — ABNORMAL LOW (ref 5–15)
BUN: 27 mg/dL — ABNORMAL HIGH (ref 8–23)
CO2: 21 mmol/L — ABNORMAL LOW (ref 22–32)
Calcium: 8.7 mg/dL — ABNORMAL LOW (ref 8.9–10.3)
Chloride: 113 mmol/L — ABNORMAL HIGH (ref 98–111)
Creatinine: 1.3 mg/dL — ABNORMAL HIGH (ref 0.44–1.00)
GFR, Estimated: 44 mL/min — ABNORMAL LOW (ref >60)
Glucose, Bld: 106 mg/dL — ABNORMAL HIGH (ref 70–99)
Potassium: 3.7 mmol/L (ref 3.5–5.1)
Sodium: 138 mmol/L (ref 135–145)
Total Bilirubin: 0.3 mg/dL (ref 0.3–1.2)
Total Protein: 6.1 g/dL — ABNORMAL LOW (ref 6.5–8.1)

## 2022-12-19 LAB — CBC WITH DIFFERENTIAL (CANCER CENTER ONLY)
Abs Immature Granulocytes: 0.02 10*3/uL (ref 0.00–0.07)
Basophils Absolute: 0 10*3/uL (ref 0.0–0.1)
Basophils Relative: 1 %
Eosinophils Absolute: 0 10*3/uL (ref 0.0–0.5)
Eosinophils Relative: 1 %
HCT: 28.7 % — ABNORMAL LOW (ref 36.0–46.0)
Hemoglobin: 9.2 g/dL — ABNORMAL LOW (ref 12.0–15.0)
Immature Granulocytes: 1 %
Lymphocytes Relative: 18 %
Lymphs Abs: 0.7 10*3/uL (ref 0.7–4.0)
MCH: 34.3 pg — ABNORMAL HIGH (ref 26.0–34.0)
MCHC: 32.1 g/dL (ref 30.0–36.0)
MCV: 107.1 fL — ABNORMAL HIGH (ref 80.0–100.0)
Monocytes Absolute: 0.4 10*3/uL (ref 0.1–1.0)
Monocytes Relative: 10 %
Neutro Abs: 2.9 10*3/uL (ref 1.7–7.7)
Neutrophils Relative %: 69 %
Platelet Count: 136 10*3/uL — ABNORMAL LOW (ref 150–400)
RBC: 2.68 MIL/uL — ABNORMAL LOW (ref 3.87–5.11)
RDW: 16.9 % — ABNORMAL HIGH (ref 11.5–15.5)
WBC Count: 4.2 10*3/uL (ref 4.0–10.5)
nRBC: 0 % (ref 0.0–0.2)

## 2022-12-19 MED ORDER — DARBEPOETIN ALFA 300 MCG/0.6ML IJ SOSY
300.0000 ug | PREFILLED_SYRINGE | Freq: Once | INTRAMUSCULAR | Status: AC
  Administered 2022-12-19: 300 ug via SUBCUTANEOUS
  Filled 2022-12-19: qty 0.6

## 2022-12-19 MED ORDER — SODIUM CHLORIDE 0.9 % IV SOLN
150.0000 mg/m2 | Freq: Once | INTRAVENOUS | Status: AC
  Administered 2022-12-19: 246 mg via INTRAVENOUS
  Filled 2022-12-19: qty 41

## 2022-12-19 MED ORDER — DIPHENHYDRAMINE HCL 50 MG/ML IJ SOLN
50.0000 mg | Freq: Once | INTRAMUSCULAR | Status: AC
  Administered 2022-12-19: 50 mg via INTRAVENOUS
  Filled 2022-12-19: qty 1

## 2022-12-19 MED ORDER — MAGIC MOUTHWASH W/LIDOCAINE: 5.0000 mL | Freq: Four times a day (QID) | ORAL | 1 refills | Status: DC | PRN

## 2022-12-19 MED ORDER — SODIUM CHLORIDE 0.9 % IV SOLN
150.0000 mg | Freq: Once | INTRAVENOUS | Status: AC
  Administered 2022-12-19: 150 mg via INTRAVENOUS
  Filled 2022-12-19: qty 150

## 2022-12-19 MED ORDER — SODIUM CHLORIDE 0.9 % IV SOLN
261.6000 mg | Freq: Once | INTRAVENOUS | Status: AC
  Administered 2022-12-19: 260 mg via INTRAVENOUS
  Filled 2022-12-19: qty 26

## 2022-12-19 MED ORDER — PALONOSETRON HCL INJECTION 0.25 MG/5ML
0.2500 mg | Freq: Once | INTRAVENOUS | Status: AC
  Administered 2022-12-19: 0.25 mg via INTRAVENOUS
  Filled 2022-12-19: qty 5

## 2022-12-19 MED ORDER — HEPARIN SOD (PORK) LOCK FLUSH 100 UNIT/ML IV SOLN
500.0000 [IU] | Freq: Once | INTRAVENOUS | Status: AC | PRN
  Administered 2022-12-19: 500 [IU]
  Filled 2022-12-19: qty 5

## 2022-12-19 MED ORDER — PEGFILGRASTIM 6 MG/0.6ML ~~LOC~~ PSKT
6.0000 mg | PREFILLED_SYRINGE | Freq: Once | SUBCUTANEOUS | Status: AC
  Administered 2022-12-19: 6 mg via SUBCUTANEOUS
  Filled 2022-12-19: qty 0.6

## 2022-12-19 MED ORDER — FAMOTIDINE IN NACL 20-0.9 MG/50ML-% IV SOLN
20.0000 mg | Freq: Once | INTRAVENOUS | Status: AC
  Administered 2022-12-19: 20 mg via INTRAVENOUS
  Filled 2022-12-19: qty 50

## 2022-12-19 MED ORDER — SODIUM CHLORIDE 0.9 % IV SOLN
10.0000 mg | Freq: Once | INTRAVENOUS | Status: AC
  Administered 2022-12-19: 10 mg via INTRAVENOUS
  Filled 2022-12-19: qty 10

## 2022-12-19 MED ORDER — SODIUM CHLORIDE 0.9 % IV SOLN
Freq: Once | INTRAVENOUS | Status: AC
  Filled 2022-12-19: qty 250

## 2022-12-19 NOTE — Patient Instructions (Addendum)
[place] [facility]  Discharge Instructions: Thank you for choosing [facility] to provide your oncology and hematology care.  If you have a lab appointment with the Cancer Center, please go directly to the Cancer Center and check in at the registration area.  Wear comfortable clothing and clothing appropriate for easy access to any Portacath or PICC line.   We strive to give you quality time with your provider. You may need to reschedule your appointment if you arrive late (15 or more minutes).  Arriving late affects you and other patients whose appointments are after yours.  Also, if you miss three or more appointments without notifying the office, you may be dismissed from the clinic at the provider's discretion.      For prescription refill requests, have your pharmacy contact our office and allow 72 hours for refills to be completed.    Today you received the following chemotherapy and/or immunotherapy agents TAXOL, CARBOPLATIN, ARNASEP      To help prevent nausea and vomiting after your treatment, we encourage you to take your nausea medication as directed.  BELOW ARE SYMPTOMS THAT SHOULD BE REPORTED IMMEDIATELY: *FEVER GREATER THAN 100.4 F (38 C) OR HIGHER *CHILLS OR SWEATING *NAUSEA AND VOMITING THAT IS NOT CONTROLLED WITH YOUR NAUSEA MEDICATION *UNUSUAL SHORTNESS OF BREATH *UNUSUAL BRUISING OR BLEEDING *URINARY PROBLEMS (pain or burning when urinating, or frequent urination) *BOWEL PROBLEMS (unusual diarrhea, constipation, pain near the anus) TENDERNESS IN MOUTH AND THROAT WITH OR WITHOUT PRESENCE OF ULCERS (sore throat, sores in mouth, or a toothache) UNUSUAL RASH, SWELLING OR PAIN  UNUSUAL VAGINAL DISCHARGE OR ITCHING   Items with * indicate a potential emergency and should be followed up as soon as possible or go to the Emergency Department if any problems should occur.  Please show the CHEMOTHERAPY ALERT CARD or IMMUNOTHERAPY ALERT  CARD at check-in to the Emergency Department and triage nurse.  Should you have questions after your visit or need to cancel or reschedule your appointment, please contact [place] [facility]  [phone] and follow the prompts.  Office hours are 8:00 a.m. to 4:30 p.m. Monday - Friday. Please note that voicemails left after 4:00 p.m. may not be returned until the following business day.  We are closed weekends and major holidays. You have access to a nurse at all times for urgent questions. Please call the main number to the clinic [phone] and follow the prompts.  For any non-urgent questions, you may also contact your provider using MyChart. We now offer e-Visits for anyone 85 and older to request care online for non-urgent symptoms. For details visit [email or web address].   Also download the MyChart app! Go to the app store, search "MyChart", open the app, select [place], and log in with your MyChart username and password.  Paclitaxel Injection What is this medication? PACLITAXEL (PAK li TAX el) treats some types of cancer. It works by slowing down the growth of cancer cells. This medicine may be used for other purposes; ask your health care provider or pharmacist if you have questions. COMMON BRAND NAME(S): Onxol, Taxol What should I tell my care team before I take this medication? They need to know if you have any of these conditions: Heart disease Liver disease Low white blood cell levels An unusual or allergic reaction to paclitaxel, other medications, foods, dyes, or preservatives If you or your partner are pregnant or trying to get pregnant Breast-feeding How should I use  this medication? This medication is injected into a vein. It is given by your care team in a hospital or clinic setting. Talk to your care team about the use of this medication in children. While it may be given to children for selected conditions, precautions do  apply. Overdosage: If you think you have taken too much of this medicine contact a poison control center or emergency room at once. NOTE: This medicine is only for you. Do not share this medicine with others. What if I miss a dose? Keep appointments for follow-up doses. It is important not to miss your dose. Call your care team if you are unable to keep an appointment. What may interact with this medication? Do not take this medication with any of the following: Live virus vaccines Other medications may affect the way this medication works. Talk with your care team about all of the medications you take. They may suggest changes to your treatment plan to lower the risk of side effects and to make sure your medications work as intended. This list may not describe all possible interactions. Give your health care provider a list of all the medicines, herbs, non-prescription drugs, or dietary supplements you use. Also tell them if you smoke, drink alcohol, or use illegal drugs. Some items may interact with your medicine. What should I watch for while using this medication? Your condition will be monitored carefully while you are receiving this medication. You may need blood work while taking this medication. This medication may make you feel generally unwell. This is not uncommon as chemotherapy can affect healthy cells as well as cancer cells. Report any side effects. Continue your course of treatment even though you feel ill unless your care team tells you to stop. This medication can cause serious allergic reactions. To reduce the risk, your care team may give you other medications to take before receiving this one. Be sure to follow the directions from your care team. This medication may increase your risk of getting an infection. Call your care team for advice if you get a fever, chills, sore throat, or other symptoms of a cold or flu. Do not treat yourself. Try to avoid being around people who are  sick. This medication may increase your risk to bruise or bleed. Call your care team if you notice any unusual bleeding. Be careful brushing or flossing your teeth or using a toothpick because you may get an infection or bleed more easily. If you have any dental work done, tell your dentist you are receiving this medication. Talk to your care team if you may be pregnant. Serious birth defects can occur if you take this medication during pregnancy. Talk to your care team before breastfeeding. Changes to your treatment plan may be needed. What side effects may I notice from receiving this medication? Side effects that you should report to your care team as soon as possible: Allergic reactions--skin rash, itching, hives, swelling of the face, lips, tongue, or throat Heart rhythm changes--fast or irregular heartbeat, dizziness, feeling faint or lightheaded, chest pain, trouble breathing Increase in blood pressure Infection--fever, chills, cough, sore throat, wounds that don't heal, pain or trouble when passing urine, general feeling of discomfort or being unwell Low blood pressure--dizziness, feeling faint or lightheaded, blurry vision Low red blood cell level--unusual weakness or fatigue, dizziness, headache, trouble breathing Painful swelling, warmth, or redness of the skin, blisters or sores at the infusion site Pain, tingling, or numbness in the hands or feet Slow heartbeat--dizziness,  feeling faint or lightheaded, confusion, trouble breathing, unusual weakness or fatigue Unusual bruising or bleeding Side effects that usually do not require medical attention (report to your care team if they continue or are bothersome): Diarrhea Hair loss Joint pain Loss of appetite Muscle pain Nausea Vomiting This list may not describe all possible side effects. Call your doctor for medical advice about side effects. You may report side effects to FDA at 1-800-FDA-1088. Where should I keep my  medication? This medication is given in a hospital or clinic. It will not be stored at home. NOTE: This sheet is a summary. It may not cover all possible information. If you have questions about this medicine, talk to your doctor, pharmacist, or health care provider.  2024 Elsevier/Gold Standard (2021-08-28 00:00:00)  Carboplatin Injection What is this medication? CARBOPLATIN (KAR boe pla tin) treats some types of cancer. It works by slowing down the growth of cancer cells. This medicine may be used for other purposes; ask your health care provider or pharmacist if you have questions. COMMON BRAND NAME(S): Paraplatin What should I tell my care team before I take this medication? They need to know if you have any of these conditions: Blood disorders Hearing problems Kidney disease Recent or ongoing radiation therapy An unusual or allergic reaction to carboplatin, cisplatin, other medications, foods, dyes, or preservatives Pregnant or trying to get pregnant Breast-feeding How should I use this medication? This medication is injected into a vein. It is given by your care team in a hospital or clinic setting. Talk to your care team about the use of this medication in children. Special care may be needed. Overdosage: If you think you have taken too much of this medicine contact a poison control center or emergency room at once. NOTE: This medicine is only for you. Do not share this medicine with others. What if I miss a dose? Keep appointments for follow-up doses. It is important not to miss your dose. Call your care team if you are unable to keep an appointment. What may interact with this medication? Medications for seizures Some antibiotics, such as amikacin, gentamicin, neomycin, streptomycin, tobramycin Vaccines This list may not describe all possible interactions. Give your health care provider a list of all the medicines, herbs, non-prescription drugs, or dietary supplements you use.  Also tell them if you smoke, drink alcohol, or use illegal drugs. Some items may interact with your medicine. What should I watch for while using this medication? Your condition will be monitored carefully while you are receiving this medication. You may need blood work while taking this medication. This medication may make you feel generally unwell. This is not uncommon, as chemotherapy can affect healthy cells as well as cancer cells. Report any side effects. Continue your course of treatment even though you feel ill unless your care team tells you to stop. In some cases, you may be given additional medications to help with side effects. Follow all directions for their use. This medication may increase your risk of getting an infection. Call your care team for advice if you get a fever, chills, sore throat, or other symptoms of a cold or flu. Do not treat yourself. Try to avoid being around people who are sick. Avoid taking medications that contain aspirin, acetaminophen, ibuprofen, naproxen, or ketoprofen unless instructed by your care team. These medications may hide a fever. Be careful brushing or flossing your teeth or using a toothpick because you may get an infection or bleed more easily. If you  have any dental work done, tell your dentist you are receiving this medication. Talk to your care team if you wish to become pregnant or think you might be pregnant. This medication can cause serious birth defects. Talk to your care team about effective forms of contraception. Do not breast-feed while taking this medication. What side effects may I notice from receiving this medication? Side effects that you should report to your care team as soon as possible: Allergic reactions--skin rash, itching, hives, swelling of the face, lips, tongue, or throat Infection--fever, chills, cough, sore throat, wounds that don't heal, pain or trouble when passing urine, general feeling of discomfort or being  unwell Low red blood cell level--unusual weakness or fatigue, dizziness, headache, trouble breathing Pain, tingling, or numbness in the hands or feet, muscle weakness, change in vision, confusion or trouble speaking, loss of balance or coordination, trouble walking, seizures Unusual bruising or bleeding Side effects that usually do not require medical attention (report to your care team if they continue or are bothersome): Hair loss Nausea Unusual weakness or fatigue Vomiting This list may not describe all possible side effects. Call your doctor for medical advice about side effects. You may report side effects to FDA at 1-800-FDA-1088. Where should I keep my medication? This medication is given in a hospital or clinic. It will not be stored at home. NOTE: This sheet is a summary. It may not cover all possible information. If you have questions about this medicine, talk to your doctor, pharmacist, or health care provider.  2024 Elsevier/Gold Standard (2021-07-31 00:00:00)  Darbepoetin Alfa Injection What is this medication? DARBEPOETIN ALFA (dar be POE e tin AL fa) treats low levels of red blood cells (anemia) caused by kidney disease or chemotherapy. It works by Systems analyst make more red blood cells, which reduces the need for blood transfusions. This medicine may be used for other purposes; ask your health care provider or pharmacist if you have questions. COMMON BRAND NAME(S): Aranesp What should I tell my care team before I take this medication? They need to know if you have any of these conditions: Blood clots Cancer Heart disease High blood pressure On dialysis Seizures Stroke An unusual or allergic reaction to darbepoetin, latex, other medications, foods, dyes, or preservatives Pregnant or trying to get pregnant Breast-feeding How should I use this medication? This medication is injected into a vein or under the skin. It is usually given by a care team in a hospital or  clinic setting. It may also be given at home. If you get this medication at home, you will be taught how to prepare and give it. Use exactly as directed. Take it as directed on the prescription label at the same time every day. Keep taking it unless your care team tells you to stop. It is important that you put your used needles and syringes in a special sharps container. Do not put them in a trash can. If you do not have a sharps container, call your pharmacist or care team to get one. A special MedGuide will be given to you by the pharmacist with each prescription and refill. Be sure to read this information carefully each time. Talk to your care team about the use of this medication in children. While this medication may be used in children as young as 1 month of age for selected conditions, precautions do apply. Overdosage: If you think you have taken too much of this medicine contact a poison control center or emergency  room at once. NOTE: This medicine is only for you. Do not share this medicine with others. What if I miss a dose? If you miss a dose, take it as soon as you can. If it is almost time for your next dose, take only that dose. Do not take double or extra doses. What may interact with this medication? Epoetin alfa Methoxy polyethylene glycol-epoetin beta This list may not describe all possible interactions. Give your health care provider a list of all the medicines, herbs, non-prescription drugs, or dietary supplements you use. Also tell them if you smoke, drink alcohol, or use illegal drugs. Some items may interact with your medicine. What should I watch for while using this medication? Visit your care team for regular checks on your progress. Check your blood pressure as directed. Know what your blood pressure should be and when to contact your care team. Your condition will be monitored carefully while you are receiving this medication. You may need blood work while taking this  medication. What side effects may I notice from receiving this medication? Side effects that you should report to your care team as soon as possible: Allergic reactions--skin rash, itching, hives, swelling of the face, lips, tongue, or throat Blood clot--pain, swelling, or warmth in the leg, shortness of breath, chest pain Heart attack--pain or tightness in the chest, shoulders, arms, or jaw, nausea, shortness of breath, cold or clammy skin, feeling faint or lightheaded Increase in blood pressure Rash, fever, and swollen lymph nodes Redness, blistering, peeling, or loosening of the skin, including inside the mouth Seizures Stroke--sudden numbness or weakness of the face, arm, or leg, trouble speaking, confusion, trouble walking, loss of balance or coordination, dizziness, severe headache, change in vision Side effects that usually do not require medical attention (report to your care team if they continue or are bothersome): Cough Stomach pain Swelling of the ankles, hands, or feet This list may not describe all possible side effects. Call your doctor for medical advice about side effects. You may report side effects to FDA at 1-800-FDA-1088. Where should I keep my medication? Keep out of the reach of children and pets. Store in a refrigerator. Do not freeze. Do not shake. Protect from light. Keep this medication in the original container until you are ready to take it. See product for storage information. Get rid of any unused medication after the expiration date. To get rid of medications that are no longer needed or have expired: Take the medication to a medication take-back program. Check with your pharmacy or law enforcement to find a location. If you cannot return the medication, ask your pharmacist or care team how to get rid of the medication safely. NOTE: This sheet is a summary. It may not cover all possible information. If you have questions about this medicine, talk to your doctor,  pharmacist, or health care provider.  2024 Elsevier/Gold Standard (2021-08-08 00:00:00)

## 2022-12-19 NOTE — Telephone Encounter (Signed)
NA

## 2022-12-19 NOTE — Progress Notes (Signed)
Patient is having some pain in her right side of mouth, and she had terrible itching for a couple of weeks.

## 2022-12-20 ENCOUNTER — Other Ambulatory Visit: Payer: Self-pay

## 2022-12-20 ENCOUNTER — Ambulatory Visit
Admission: RE | Admit: 2022-12-20 | Discharge: 2022-12-20 | Disposition: A | Discharge status: Home / Self Care | Payer: PPO | Source: Ambulatory Visit | Attending: Radiation Oncology | Admitting: Radiation Oncology

## 2022-12-20 ENCOUNTER — Encounter: Payer: Self-pay | Admitting: Internal Medicine

## 2022-12-20 DIAGNOSIS — Z5111 Encounter for antineoplastic chemotherapy: Secondary | ICD-10-CM | POA: Diagnosis not present

## 2022-12-20 LAB — RAD ONC ARIA SESSION SUMMARY
Course Elapsed Days: 2
Plan Fractions Treated to Date: 3
Plan Prescribed Dose Per Fraction: 3 Gy
Plan Total Fractions Prescribed: 10
Plan Total Prescribed Dose: 30 Gy
Reference Point Dosage Given to Date: 9 Gy
Reference Point Session Dosage Given: 3 Gy
Session Number: 3

## 2022-12-20 NOTE — Progress Notes (Signed)
[facility] OFFICE PROGRESS NOTE  Patient Care Team: Dana Allan, MD as PCP - General (Family Medicine) Glory Buff, RN as Oncology Nurse Navigator Michaelyn Barter, MD as Consulting Physician (Oncology)  TREATMENT:  Right hip palliative RT 30 cGy completed 12/18/2021 Carboplatin, alimta and Keytruda x 4 cycles completed 02/19/22.  Discontinued maintenance alimta (04-23-22) due to worsening renal dysfunction, transaminitis and cytopenias Keytruda maintenance-discontinued on 09/17/2022 due to disease progression Palliative RT to left frontal lesion 5 fx completed 09/27/22 Carboplatin (AUC 4) and paclitaxel 150 mg/m2 - started 10/07/2022   ASSESSMENT & PLAN:  # Primary lung adenocarcinoma (HCC), Stage IV, PDL1 1% -Treated with 4 cycles of carboplatin and Alimta then on maintenance Keytruda until May 2024.  Initial Foundation 1 testing showed PD-L1 1%.  No other targetable mutations.  Report below  -discontinued maintenance Alimta on 04/23/2022 due to worsening renal function, hematologic toxicity requiring multiple blood transfusions and elevated LFTs despite dose reductions.   -Restaging CT chest abdomen pelvis without contrast (09/10/2022)-marked interval disease progression in lungs, bone and also brain.  Had second opinion with Dr. Kemper Durie at [facility].  No clinical trials available due to brain mets progression.  Liquid foundation 1 testing did not show any targetable mutations.  Tissue biopsy recommended by [facility] for NGS testing.  Recommendations were discussed with the patient.  Pros and cons was discussed about the yield of bone biopsy with the use of decalcifying agent versus lung biopsy.  Patient was seen by pulmonary and the risks benefits were discussed.  Patient decided to proceed with the procedure and is scheduled for end of August.   -CT scans from August 2024 were discussed with the patient and daughter. Showing decrease in the size of left upper lobe mass from 2.5 to 3.4 cm to  2.6 x 3 cm.  Septal thickening and perilymphatic nodularity has improved.  Multiple osseous metastatic lesions many of which show evidence of interval healing.  The scans were also reviewed in tumor board.  Overall disease is stable to mildly improved.  -Started on second line carbo AUC 4 and Taxol 150 mg/m on 10/07/2022.  Labs reviewed and acceptable for treatment.  Will proceed with cycle 4 of carbo and Taxol.  Aranesp 300 mcg.  Neulasta on pro.  Plan for total 6 cycles of CarboTaxol.  I also discussed about adding Avastin with cycle #5 considering she is doing well in terms of myelotoxicity and also done with her bronchoscopy procedure.  Patient would like to think about it.  Side effects were discussed such as impaired wound healing, hypertension, proteinuria, increased risk of bleeding.  Hold Xgeva today.  Dental clearance not obtained.  -Status post EBUS with lung biopsy on 12/17/2022 to send for foundation 1 CDX testing to assess for any targetable mutations.  # Secondary metastasis to brain -MRI brain with and without contrast (09/10/2022) showed interval mild progression of left frontal calvarium lesion with extension of tumor to involve subjacent dura measuring 2.8 x 2 cm.  Suspect slight/early extension into superior left orbit as well as extension of bony edema to involve left sphenoid wing with probable slight involvement of adjacent dura in the region.  New 1 cm enhancing lesion along the posterior left temporal lobe along the falx.  -Completed palliative RT to left frontal lesion 5 fractions on 09/27/2022.  Repeat MRI from 10/25/2022 showed slight increase in the left frontal calvarium to 3.1 cm and thickened lining could be reactive versus progression.  1 cm lesion in the  posterior temporal lobe is stable.    -Follows with Dr. Barbaraann Cao.  Repeat MRI brain with and without contrast scheduled for September 13.  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 Cr stabilized.  - will continue to closely monitor.  #  Chemotherapy-induced anemia  -Will monitor while on chemotherapy.   -Will proceed with Aranesp 300 mcg with every chemo cycle.  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 Aggie Cosier on 12/18/2021 -Was seen by [facility].  No surgical intervention needed.  # Left hip pain -MRI left hip showing metastatic lesions.  Undergoing radiation.  Continue with tramadol 75 mg every 6 as needed.  # Metastatic bone lesions -With the stabilization of creatinine, will plan to start her on Xgeva.  Requested for dental clearance.  # Chemotherapy-induced mucositis -Sent prescription for Magic mouthwash.  # Access - port  RTC in 3 weeks for MD visit, labs, cycle 5 of CarboTaxol. Scheduled every week for labs and fluids.  Orders Placed This Encounter  Procedures   CBC with Differential (Cancer Center Only)    Standing Status:   Future    Standing Expiration Date:   01/09/2024   CMP (Cancer Center only)    Standing Status:   Future    Standing Expiration Date:   01/09/2024    [facility]- 12/06/2021   All questions were answered. The patient knows to call the clinic with any problems, questions or concerns. The total time spent in the appointment was 30 minutes encounter with patients including review of chart and various tests results, discussions about plan of care and coordination of care plan   Michaelyn Barter, MD 12/20/2022 4:32 PM  INTERVAL HISTORY: Please see below for problem oriented charting.  Patient following with [place] for treatment of stage IV lung adenocarcinoma.   Patient was seen today accompanied with daughter prior to cycle 4 of [place] and Taxol. She is tolerating treatment well.  Denies any peripheral neuropathy.  Energy level fluctuates.  Continues to have pain in the left hip.  Just started radiation 2 days ago.  Also complaining of pain in the right backside of her mouth.  Examined the area and there is presence of bruise.  She has  been having mouth ulcers with the chemotherapy treatment.  REVIEW OF SYSTEMS:   Positive ROS as above.  Rest 10 points ROS negative   I have reviewed the past medical history, past surgical history, social history and family history with the patient and they are unchanged from previous note.  ALLERGIES:  is allergic to diclofenac, lisinopril, and sulfa antibiotics.  MEDICATIONS:  Current Outpatient Medications  Medication Sig Dispense Refill   Azelastine HCl 137 MCG/SPRAY SOLN Place 2 sprays into both nostrils 2 (two) times daily.     losartan (COZAAR) 100 MG tablet Take 1 tablet (100 mg total) by mouth daily. 30 tablet 0   magic mouthwash w/lidocaine SOLN Take 5 mLs by mouth 4 (four) times daily as needed for mouth pain. Sig: Swish/Swallow 5-10 ml four times a day as needed. Dispense 480 ml. 1RF 480 mL 1   magic mouthwash w/lidocaine SOLN Take 5 mLs by mouth 4 (four) times daily as needed for mouth pain. Sig: Swish/Swallow 5-10 ml four times a day as needed. Dispense 480 ml. 1RF 480 mL 1   nebivolol (BYSTOLIC) 2.5 MG tablet Take 1 tablet (2.5 mg total) by mouth daily. 90 tablet 0   traMADol (ULTRAM) 50 MG tablet Take 1 tablet (50  mg total) by mouth every 6 (six) hours as needed. 60 tablet 0   traMADol HCl 25 MG TABS Take 1 tablet by mouth every 6 (six) hours as needed for up to 90 doses. Please add with your 50 mg tramadol to make it 75 mg every 6 hours as needed for pain. 90 tablet 0   albuterol (VENTOLIN HFA) 108 (90 Base) MCG/ACT inhaler Inhale 2 puffs into the lungs every 6 (six) hours as needed for wheezing or shortness of breath. (Patient not taking: Reported on 12/19/2022) 18 g 0   Cholecalciferol (VITAMIN D-3 PO) Take by mouth. (Patient not taking: Reported on 12/04/2022)     dexamethasone (DECADRON) 4 MG tablet Take 1 tablet (4 mg total) by mouth daily. Take for 3 days after chemotherapy (Patient not taking: Reported on 12/04/2022) 30 tablet 0   fluticasone (FLONASE) 50 MCG/ACT nasal  spray USE TWO SPRAYS IN EACH NOSTRIL ONCE DAILY prn (Patient not taking: Reported on 11/29/2022) 16 g 12   HYDROcodone-acetaminophen (NORCO/VICODIN) 5-325 MG tablet Take by mouth. (Patient not taking: Reported on 11/25/2022)     loratadine (CLARITIN) 10 MG tablet TAKE ONE TABLET BY MOUTH DAILY AS NEEDED FOR ALLERGIES (Patient not taking: Reported on 11/29/2022) 90 tablet 3   MULTIPLE VITAMIN PO Take 1 tablet by mouth daily. (Patient not taking: Reported on 11/29/2022)     ondansetron (ZOFRAN) 8 MG tablet Take 1 tablet (8 mg total) by mouth every 8 (eight) hours as needed for nausea or vomiting. (Patient not taking: Reported on 12/04/2022) 30 tablet 3   No current facility-administered medications for this visit.   Facility-Administered Medications Ordered in Other Visits  Medication Dose Route Frequency Provider Last Rate Last Admin   heparin lock flush 100 UNIT/ML injection            heparin lock flush 100 unit/mL  500 Units Intravenous Once Michaelyn Barter, MD       sodium chloride flush (NS) 0.9 % injection 10 mL  10 mL Intravenous Once Michaelyn Barter, MD        SUMMARY OF ONCOLOGIC HISTORY: Oncology History  Primary lung adenocarcinoma (HCC)  10/10/2021 Initial Diagnosis   Primary lung adenocarcinoma (HCC) Stage IV  Patient seen by PCP for wheezing for 2 weeks. Imaging with CXR followed by CT chest showed Spiculated 2.1 x 2.2 cm left upper lobe pulmonary nodule with associated pleural tethering. Innumerable subcentimeter pulmonary nodules throughout the lungs.      11/14/2021 PET scan   IMPRESSION: 1. Left suprahilar nodule with maximum SUV 4.5, and a more distal 2.2 cm left upper lobe nodule with maximum SUV of 5.5. 2. Innumerable scattered small pulmonary nodules, most of which are under 5 mm in diameter, throughout both lungs. Some are cavitary. Possibilities include cavitating hematogenous disseminated malignancy versus infectious etiology subset as septic emboli. Most of the nodules  are stable, some are minimally enlarged compared to 10/10/2021. 3. Abnormal mild permeative type bony findings in the right hemipelvis associated with substantially accentuated metabolic activity. Possible associated pathologic fracture through the right quadrilateral plate and right acetabulum. The findings involve the right ilium and ischium but also the right lateral sacral ala and a small focus in the left sacrum.    Pathology Results   She had transbronchial biopsies by Dr. Jayme Cloud on 11/19/21.  Pathology from LUL nodule showed adenocarcinoma, consistent with lung primary.    11/19/2021 Cancer Staging   Staging form: Lung, AJCC 8th Edition - Clinical stage from 11/19/2021: Stage IVB (  cT1c, cM1c) - Signed by Michaelyn Barter, MD on 11/27/2021 Stage prefix: Initial diagnosis   11/27/2021 Imaging   MRI pelvis  IMPRESSION: 1. Abnormal marrow signal throughout the right ilium involving the right acetabulum, right superior pubic ramus and right inferior pubic ramus, right side of the sacrum and a smaller focus of abnormal marrow lesion in the left side of the sacrum. Findings are concerning for metastatic disease. 2. Nondisplaced pathologic fracture of the quadrilateral plate of the right acetabulum. 3. Mild muscle edema in the right gluteus minimus and medius muscles likely reflecting mild muscle strain.  MRI brain  IMPRESSION: No evidence of intracranial metastatic disease.   Indeterminate 1 cm left frontal calvarium lesion.   12/18/2021 - 08/27/2022 Chemotherapy   Patient is on Treatment Plan : LUNG Carboplatin (5) + Pemetrexed (500) + Pembrolizumab (200) D1 q21d Induction x 4 cycles / Maintenance Pemetrexed (500) + Pembrolizumab (200) D1 q21d     10/07/2022 -  Chemotherapy   Patient is on Treatment Plan : LUNG Carboplatin + Paclitaxel q21d Dose Reduction     Primary malignant neoplasm of left upper lobe of lung (HCC)  06/04/2022 Initial Diagnosis   Primary malignant neoplasm of left  upper lobe of lung (HCC)   06/04/2022 Cancer Staging   Staging form: Lung, AJCC 8th Edition - Pathologic: Stage IVB (pT1c, pNX, cM1c) - Signed by Earna Coder, MD on 06/04/2022     PHYSICAL EXAMINATION: ECOG PERFORMANCE STATUS: 2 - Symptomatic, <50% confined to bed  Vitals:   12/19/22 0837  BP: 124/69  Pulse: 79  Temp: 98.1 F (36.7 C)  SpO2: 100%    Filed Weights   12/19/22 0837  Weight: 140 lb (63.5 kg)     Physical Exam Constitutional:      Appearance: Normal appearance.  HENT:     Head: Normocephalic and atraumatic.  Cardiovascular:     Rate and Rhythm: Normal rate.  Pulmonary:     Effort: Pulmonary effort is normal.  Musculoskeletal:     Right lower leg: No edema.     Left lower leg: No edema.  Skin:    General: Skin is warm.  Neurological:     General: No focal deficit present.     Mental Status: She is oriented to person, place, and time.  Psychiatric:        Mood and Affect: Mood normal.     LABORATORY DATA:  I have reviewed the data as listed    Component Value Date/Time   NA 138 12/19/2022 0821   K 3.7 12/19/2022 0821   CL 113 (H) 12/19/2022 0821   CO2 21 (L) 12/19/2022 0821   GLUCOSE 106 (H) 12/19/2022 0821   BUN 27 (H) 12/19/2022 0821   CREATININE 1.30 (H) 12/19/2022 0821   CALCIUM 8.7 (L) 12/19/2022 0821   PROT 6.1 (L) 12/19/2022 0821   ALBUMIN 3.6 12/19/2022 0821   AST 18 12/19/2022 0821   ALT 13 12/19/2022 0821   ALKPHOS 305 (H) 12/19/2022 0821   BILITOT 0.3 12/19/2022 0821   GFRNONAA 44 (L) 12/19/2022 0821    No results found for: "SPEP", "UPEP"  Lab Results  Component Value Date   WBC 4.2 12/19/2022   NEUTROABS 2.9 12/19/2022   HGB 9.2 (L) 12/19/2022   HCT 28.7 (L) 12/19/2022   MCV 107.1 (H) 12/19/2022   PLT 136 (L) 12/19/2022      Chemistry      Component Value Date/Time   NA 138 12/19/2022 0821   K  3.7 12/19/2022 0821   CL 113 (H) 12/19/2022 0821   CO2 21 (L) 12/19/2022 0821   BUN 27 (H) 12/19/2022 0821    CREATININE 1.30 (H) 12/19/2022 0821      Component Value Date/Time   CALCIUM 8.7 (L) 12/19/2022 0821   ALKPHOS 305 (H) 12/19/2022 0821   AST 18 12/19/2022 0821   ALT 13 12/19/2022 0821   BILITOT 0.3 12/19/2022 0821       RADIOGRAPHIC STUDIES: I have personally reviewed the radiological images as listed and agreed with the findings in the report. DG Chest Port 1 View  Result Date: 12/17/2022 CLINICAL DATA:  Status post bronchoscopy EXAM: PORTABLE CHEST 1 VIEW COMPARISON:  11/19/2021 FINDINGS: Heart size is normal. Right chest port catheter. Spiculated mass of the medial left upper lobe. No pneumothorax or acute appearing airspace opacity. Osseous structures unremarkable. IMPRESSION: Spiculated mass of the medial left upper lobe. No pneumothorax or acute appearing airspace opacity. Electronically Signed   By: Jearld Lesch M.D.   On: 12/17/2022 13:10   DG C-ARM BRONCHOSCOPY  Result Date: 12/17/2022 C-ARM BRONCHOSCOPY: Fluoroscopy was utilized by the requesting physician.  No radiographic interpretation.   CT Super D Chest Wo Contrast  Result Date: 12/16/2022 CLINICAL DATA:  Stage IV lung cancer.  * Tracking Code: BO * EXAM: CT CHEST, ABDOMEN AND PELVIS WITHOUT CONTRAST TECHNIQUE: Multidetector CT imaging of the chest, abdomen and pelvis was performed following the standard protocol without IV contrast. RADIATION DOSE REDUCTION: This exam was performed according to the departmental dose-optimization program which includes automated exposure control, adjustment of the mA and/or kV according to patient size and/or use of iterative reconstruction technique. COMPARISON:  09/10/2022. FINDINGS: CT CHEST FINDINGS Cardiovascular: Right IJ Port-A-Cath terminates in the right atrium. Atherosclerotic calcification of the aorta. Heart size normal. No pericardial effusion. Mediastinum/Nodes: No pathologically enlarged mediastinal or axillary lymph nodes. Hilar regions are difficult to definitively  evaluate without IV contrast. Esophagus is grossly unremarkable. Lungs/Pleura: Spiculated left upper lobe masslike consolidation measures approximately 2.6 x 3.0 cm (4/38), overall minimally decreased from 2.5 x 3.4 cm on 09/10/2022. Upper and midlung zone predominant septal thickening and perilymphatic nodularity have improved slightly in the interval as well. No pleural fluid. Airway is unremarkable. Musculoskeletal: New rounded sclerotic lesion in the T12 vertebral body (4/130). CT ABDOMEN PELVIS FINDINGS Hepatobiliary: Liver is grossly unremarkable. Stones in the gallbladder. No biliary ductal dilatation. Pancreas: Negative. Spleen: Negative. Adrenals/Urinary Tract: Adrenal glands are unremarkable. Low-attenuation lesion in the right kidney. No specific follow-up necessary. Kidneys are otherwise unremarkable. Ureters are decompressed. Possible nodular bladder wall thickening along the right lateral wall (2/64). Stomach/Bowel: Stomach, small bowel, appendix and colon are unremarkable. Vascular/Lymphatic: Atherosclerotic calcification of the aorta. Reproductive: Uterus is visualized. There may be a uterine fibroid. No adnexal mass. Other: No free fluid.  Mesenteries and peritoneum are unremarkable. Musculoskeletal: Intramedullary rod in the left femur. Cortical regularity involving the left third anterior rib. Sclerotic lesion in the proximal right humerus new fluffy sclerosis in the L1, L3 and L4 vertebral bodies as well as new rounded sclerosis in the left iliac wing. Some of these are associated previously seen lytic lesions. Similar pathologic compression of the L3 superior endplate. Grade 1 anterolisthesis of L4 on L5. Similar heterogeneously sclerotic sacral and right iliac wing metastases. IMPRESSION: 1. 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 2. Several osseous metastatic lesions, many of which  show evidence of  interval healing. 3. Possible nodular wall thickening along the right lateral bladder wall. Difficult to exclude urothelial carcinoma. Please correlate clinically. 4. Cholelithiasis. 5.  Aortic atherosclerosis (ICD10-I70.0). Electronically Signed   By: Leanna Battles M.D.   On: 12/16/2022 13:49   CT ABDOMEN PELVIS WO CONTRAST  Result Date: 12/16/2022 CLINICAL DATA:  Stage IV lung cancer.  * Tracking Code: BO * EXAM: CT CHEST, ABDOMEN AND PELVIS WITHOUT CONTRAST TECHNIQUE: Multidetector CT imaging of the chest, abdomen and pelvis was performed following the standard protocol without IV contrast. RADIATION DOSE REDUCTION: This exam was performed according to the departmental dose-optimization program which includes automated exposure control, adjustment of the mA and/or kV according to patient size and/or use of iterative reconstruction technique. COMPARISON:  09/10/2022. FINDINGS: CT CHEST FINDINGS Cardiovascular: Right IJ Port-A-Cath terminates in the right atrium. Atherosclerotic calcification of the aorta. Heart size normal. No pericardial effusion. Mediastinum/Nodes: No pathologically enlarged mediastinal or axillary lymph nodes. Hilar regions are difficult to definitively evaluate without IV contrast. Esophagus is grossly unremarkable. Lungs/Pleura: Spiculated left upper lobe masslike consolidation measures approximately 2.6 x 3.0 cm (4/38), overall minimally decreased from 2.5 x 3.4 cm on 09/10/2022. Upper and midlung zone predominant septal thickening and perilymphatic nodularity have improved slightly in the interval as well. No pleural fluid. Airway is unremarkable. Musculoskeletal: New rounded sclerotic lesion in the T12 vertebral body (4/130). CT ABDOMEN PELVIS FINDINGS Hepatobiliary: Liver is grossly unremarkable. Stones in the gallbladder. No biliary ductal dilatation. Pancreas: Negative. Spleen: Negative. Adrenals/Urinary Tract: Adrenal glands are unremarkable. Low-attenuation  lesion in the right kidney. No specific follow-up necessary. Kidneys are otherwise unremarkable. Ureters are decompressed. Possible nodular bladder wall thickening along the right lateral wall (2/64). Stomach/Bowel: Stomach, small bowel, appendix and colon are unremarkable. Vascular/Lymphatic: Atherosclerotic calcification of the aorta. Reproductive: Uterus is visualized. There may be a uterine fibroid. No adnexal mass. Other: No free fluid.  Mesenteries and peritoneum are unremarkable. Musculoskeletal: Intramedullary rod in the left femur. Cortical regularity involving the left third anterior rib. Sclerotic lesion in the proximal right humerus new fluffy sclerosis in the L1, L3 and L4 vertebral bodies as well as new rounded sclerosis in the left iliac wing. Some of these are associated previously seen lytic lesions. Similar pathologic compression of the L3 superior endplate. Grade 1 anterolisthesis of L4 on L5. Similar heterogeneously sclerotic sacral and right iliac wing metastases. IMPRESSION: 1. 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 2. Several osseous metastatic lesions, many of which show evidence of interval healing. 3. Possible nodular wall thickening along the right lateral bladder wall. Difficult to exclude urothelial carcinoma. Please correlate clinically. 4. Cholelithiasis. 5.  Aortic atherosclerosis (ICD10-I70.0). Electronically Signed   By: Leanna Battles M.D.   On: 12/16/2022 13:49   NM Bone Scan Whole Body  Result Date: 12/05/2022 CLINICAL DATA:  Non-small-cell lung cancer. EXAM: NUCLEAR MEDICINE WHOLE BODY BONE SCAN TECHNIQUE: Whole body anterior and posterior images were obtained approximately 3 hours after intravenous injection of radiopharmaceutical. RADIOPHARMACEUTICALS:  23.44 mCi Technetium-2m MDP IV COMPARISON:  CT scan without contrast 09/10/2018 chest, abdomen pelvis  FINDINGS: Multiple focal areas of abnormal radiotracer uptake identified. This includes along the skull, left-greater-than-right, anterior aspect of the left third rib, right shoulder, left mid humerus, multiple levels in the lumbar spine, left scapula, midthoracic spine as well as scattered in the pelvis and left proximal femur. There is physiologic distribution of radiotracer as well along the  kidneys and bladder. IMPRESSION: Multifocal osseous metastatic disease. Electronically Signed   By: Karen Kays M.D.   On: 12/05/2022 16:16   MR HIP LEFT WO CONTRAST  Result Date: 11/21/2022 CLINICAL DATA:  Left hip pain for the past 2 months. No injury. History of lung cancer. EXAM: MR OF THE LEFT HIP WITHOUT CONTRAST TECHNIQUE: Multiplanar, multisequence MR imaging was performed. No intravenous contrast was administered. COMPARISON:  CT chest, abdomen, and pelvis dated Sep 10, 2022. MRI pelvis dated November 27, 2021. FINDINGS: Bones: Metastasis involving the left femoral head, neck, and intertrochanteric region is new since last August. Subacute to chronic pathologic fracture of the lesser trochanter is unchanged since recent CT from May, but progressed since CT from February. Mild soft tissue swelling around the left intertrochanteric femur. Left femoral intramedullary rod remains in place. Diffuse marrow signal abnormality involving the right ilium, ischium, and sacral ala demonstrates decreased T2 signal compared to prior MRI. Unchanged chronic pathologic fracture of the right acetabulum. Decreased T2 signal associated with the left sacral ala metastasis. Right-sided metastasis in the L3 vertebral body with associated mild pathologic compression fracture, not significantly changed compared to recent CT. Articular cartilage and labrum Articular cartilage:  Mild bilateral hip degenerative changes. Labrum: Grossly intact, although evaluation is limited due to lack of intra-articular fluid. No paralabral abnormality.  Joint or bursal effusion Joint effusion: No significant hip joint effusion. Bursae: No focal periarticular fluid collection. Muscles and tendons Muscles and tendons: The visualized gluteus, hamstring and iliopsoas tendons appear normal. Other findings Miscellaneous: The visualized internal pelvic contents appear unremarkable. IMPRESSION: 1. Metastasis involving the left femoral head, neck, and intertrochanteric region is new since last August. Subacute to chronic pathologic fracture of the lesser trochanter is unchanged since recent CT from May, but progressed since CT from February. No definite intertrochanteric or femoral neck fracture at this time. 2. Diffuse marrow signal abnormality involving the right ilium, ischium, and sacral alas demonstrates decreased T2 signal compared to prior MRI, likely reflecting treatment response. 3. Unchanged chronic pathologic fracture of the right acetabulum. 4. Right-sided metastasis in the L3 vertebral body with associated mild pathologic compression fracture is not significantly changed compared to recent CT. Electronically Signed   By: Obie Dredge M.D.   On: 11/21/2022 10:02

## 2022-12-22 ENCOUNTER — Other Ambulatory Visit: Payer: Self-pay | Admitting: Family Medicine

## 2022-12-22 DIAGNOSIS — I10 Essential (primary) hypertension: Secondary | ICD-10-CM

## 2022-12-22 DIAGNOSIS — R000 Tachycardia, unspecified: Secondary | ICD-10-CM

## 2022-12-24 ENCOUNTER — Ambulatory Visit
Admission: RE | Admit: 2022-12-24 | Discharge: 2022-12-24 | Disposition: A | Discharge status: Home / Self Care | Payer: PPO | Source: Ambulatory Visit | Attending: Radiation Oncology | Admitting: Radiation Oncology

## 2022-12-24 ENCOUNTER — Other Ambulatory Visit: Payer: Self-pay

## 2022-12-24 DIAGNOSIS — D649 Anemia, unspecified: Secondary | ICD-10-CM | POA: Diagnosis not present

## 2022-12-24 DIAGNOSIS — Z510 Encounter for antineoplastic radiation therapy: Secondary | ICD-10-CM | POA: Diagnosis present

## 2022-12-24 DIAGNOSIS — R918 Other nonspecific abnormal finding of lung field: Secondary | ICD-10-CM | POA: Diagnosis not present

## 2022-12-24 DIAGNOSIS — Z79899 Other long term (current) drug therapy: Secondary | ICD-10-CM | POA: Diagnosis not present

## 2022-12-24 DIAGNOSIS — Z5181 Encounter for therapeutic drug level monitoring: Secondary | ICD-10-CM | POA: Diagnosis not present

## 2022-12-24 DIAGNOSIS — C7931 Secondary malignant neoplasm of brain: Secondary | ICD-10-CM | POA: Diagnosis not present

## 2022-12-24 DIAGNOSIS — C7951 Secondary malignant neoplasm of bone: Secondary | ICD-10-CM | POA: Insufficient documentation

## 2022-12-24 DIAGNOSIS — R748 Abnormal levels of other serum enzymes: Secondary | ICD-10-CM | POA: Insufficient documentation

## 2022-12-24 DIAGNOSIS — C3412 Malignant neoplasm of upper lobe, left bronchus or lung: Secondary | ICD-10-CM | POA: Insufficient documentation

## 2022-12-24 LAB — RAD ONC ARIA SESSION SUMMARY
Course Elapsed Days: 6
Plan Fractions Treated to Date: 4
Plan Prescribed Dose Per Fraction: 3 Gy
Plan Total Fractions Prescribed: 10
Plan Total Prescribed Dose: 30 Gy
Reference Point Dosage Given to Date: 12 Gy
Reference Point Session Dosage Given: 3 Gy
Session Number: 4

## 2022-12-25 ENCOUNTER — Encounter: Payer: Self-pay | Admitting: Internal Medicine

## 2022-12-25 ENCOUNTER — Ambulatory Visit
Admission: RE | Admit: 2022-12-25 | Discharge: 2022-12-25 | Disposition: A | Discharge status: Home / Self Care | Payer: PPO | Source: Ambulatory Visit | Attending: Radiation Oncology | Admitting: Radiation Oncology

## 2022-12-25 ENCOUNTER — Other Ambulatory Visit: Payer: Self-pay

## 2022-12-25 DIAGNOSIS — Z510 Encounter for antineoplastic radiation therapy: Secondary | ICD-10-CM | POA: Diagnosis not present

## 2022-12-25 LAB — RAD ONC ARIA SESSION SUMMARY
Course Elapsed Days: 7
Plan Fractions Treated to Date: 5
Plan Prescribed Dose Per Fraction: 3 Gy
Plan Total Fractions Prescribed: 10
Plan Total Prescribed Dose: 30 Gy
Reference Point Dosage Given to Date: 15 Gy
Reference Point Session Dosage Given: 3 Gy
Session Number: 5

## 2022-12-26 ENCOUNTER — Inpatient Hospital Stay: Payer: PPO

## 2022-12-26 ENCOUNTER — Other Ambulatory Visit: Payer: Self-pay

## 2022-12-26 ENCOUNTER — Inpatient Hospital Stay: Payer: PPO | Attending: Internal Medicine

## 2022-12-26 ENCOUNTER — Ambulatory Visit
Admission: RE | Admit: 2022-12-26 | Discharge: 2022-12-26 | Disposition: A | Discharge status: Home / Self Care | Payer: PPO | Source: Ambulatory Visit | Attending: Radiation Oncology | Admitting: Radiation Oncology

## 2022-12-26 VITALS — BP 102/66 | HR 94 | Temp 98.5°F | Resp 20

## 2022-12-26 DIAGNOSIS — Z8616 Personal history of COVID-19: Secondary | ICD-10-CM | POA: Insufficient documentation

## 2022-12-26 DIAGNOSIS — R609 Edema, unspecified: Secondary | ICD-10-CM | POA: Insufficient documentation

## 2022-12-26 DIAGNOSIS — C3490 Malignant neoplasm of unspecified part of unspecified bronchus or lung: Secondary | ICD-10-CM

## 2022-12-26 DIAGNOSIS — K8020 Calculus of gallbladder without cholecystitis without obstruction: Secondary | ICD-10-CM | POA: Insufficient documentation

## 2022-12-26 DIAGNOSIS — Z5111 Encounter for antineoplastic chemotherapy: Secondary | ICD-10-CM | POA: Insufficient documentation

## 2022-12-26 DIAGNOSIS — I129 Hypertensive chronic kidney disease with stage 1 through stage 4 chronic kidney disease, or unspecified chronic kidney disease: Secondary | ICD-10-CM | POA: Insufficient documentation

## 2022-12-26 DIAGNOSIS — C7951 Secondary malignant neoplasm of bone: Secondary | ICD-10-CM | POA: Insufficient documentation

## 2022-12-26 DIAGNOSIS — R21 Rash and other nonspecific skin eruption: Secondary | ICD-10-CM | POA: Insufficient documentation

## 2022-12-26 DIAGNOSIS — C3412 Malignant neoplasm of upper lobe, left bronchus or lung: Secondary | ICD-10-CM | POA: Insufficient documentation

## 2022-12-26 DIAGNOSIS — N189 Chronic kidney disease, unspecified: Secondary | ICD-10-CM | POA: Insufficient documentation

## 2022-12-26 DIAGNOSIS — R350 Frequency of micturition: Secondary | ICD-10-CM | POA: Insufficient documentation

## 2022-12-26 DIAGNOSIS — I700 Atherosclerosis of aorta: Secondary | ICD-10-CM | POA: Insufficient documentation

## 2022-12-26 DIAGNOSIS — Z79899 Other long term (current) drug therapy: Secondary | ICD-10-CM | POA: Insufficient documentation

## 2022-12-26 DIAGNOSIS — Z510 Encounter for antineoplastic radiation therapy: Secondary | ICD-10-CM | POA: Diagnosis not present

## 2022-12-26 DIAGNOSIS — C7931 Secondary malignant neoplasm of brain: Secondary | ICD-10-CM | POA: Insufficient documentation

## 2022-12-26 LAB — CBC WITH DIFFERENTIAL/PLATELET
Abs Immature Granulocytes: 0.43 10*3/uL — ABNORMAL HIGH (ref 0.00–0.07)
Basophils Absolute: 0 10*3/uL (ref 0.0–0.1)
Basophils Relative: 1 %
Eosinophils Absolute: 0 10*3/uL (ref 0.0–0.5)
Eosinophils Relative: 1 %
HCT: 27.3 % — ABNORMAL LOW (ref 36.0–46.0)
Hemoglobin: 8.9 g/dL — ABNORMAL LOW (ref 12.0–15.0)
Immature Granulocytes: 10 %
Lymphocytes Relative: 16 %
Lymphs Abs: 0.7 10*3/uL (ref 0.7–4.0)
MCH: 35 pg — ABNORMAL HIGH (ref 26.0–34.0)
MCHC: 32.6 g/dL (ref 30.0–36.0)
MCV: 107.5 fL — ABNORMAL HIGH (ref 80.0–100.0)
Monocytes Absolute: 0.7 10*3/uL (ref 0.1–1.0)
Monocytes Relative: 16 %
Neutro Abs: 2.4 10*3/uL (ref 1.7–7.7)
Neutrophils Relative %: 56 %
Platelets: 92 10*3/uL — ABNORMAL LOW (ref 150–400)
RBC: 2.54 MIL/uL — ABNORMAL LOW (ref 3.87–5.11)
RDW: 15.4 % (ref 11.5–15.5)
Smear Review: NORMAL
WBC: 4.2 10*3/uL (ref 4.0–10.5)
nRBC: 0 % (ref 0.0–0.2)

## 2022-12-26 LAB — RAD ONC ARIA SESSION SUMMARY
Course Elapsed Days: 8
Plan Fractions Treated to Date: 6
Plan Prescribed Dose Per Fraction: 3 Gy
Plan Total Fractions Prescribed: 10
Plan Total Prescribed Dose: 30 Gy
Reference Point Dosage Given to Date: 18 Gy
Reference Point Session Dosage Given: 3 Gy
Session Number: 6

## 2022-12-26 LAB — COMPREHENSIVE METABOLIC PANEL
ALT: 21 U/L (ref 0–44)
AST: 22 U/L (ref 15–41)
Albumin: 3.6 g/dL (ref 3.5–5.0)
Alkaline Phosphatase: 268 U/L — ABNORMAL HIGH (ref 38–126)
Anion gap: 7 (ref 5–15)
BUN: 29 mg/dL — ABNORMAL HIGH (ref 8–23)
CO2: 20 mmol/L — ABNORMAL LOW (ref 22–32)
Calcium: 8.6 mg/dL — ABNORMAL LOW (ref 8.9–10.3)
Chloride: 108 mmol/L (ref 98–111)
Creatinine, Ser: 1.42 mg/dL — ABNORMAL HIGH (ref 0.44–1.00)
GFR, Estimated: 40 mL/min — ABNORMAL LOW (ref >60)
Glucose, Bld: 100 mg/dL — ABNORMAL HIGH (ref 70–99)
Potassium: 4.2 mmol/L (ref 3.5–5.1)
Sodium: 135 mmol/L (ref 135–145)
Total Bilirubin: 0.5 mg/dL (ref 0.3–1.2)
Total Protein: 6.4 g/dL — ABNORMAL LOW (ref 6.5–8.1)

## 2022-12-26 LAB — CYTOLOGY - NON PAP

## 2022-12-26 MED ORDER — SODIUM CHLORIDE 0.9 % IV SOLN
Freq: Once | INTRAVENOUS | Status: AC
  Filled 2022-12-26: qty 250

## 2022-12-26 MED ORDER — SODIUM CHLORIDE 0.9% FLUSH
10.0000 mL | Freq: Once | INTRAVENOUS | Status: AC | PRN
  Administered 2022-12-26: 10 mL
  Filled 2022-12-26: qty 10

## 2022-12-26 MED ORDER — HEPARIN SOD (PORK) LOCK FLUSH 100 UNIT/ML IV SOLN
500.0000 [IU] | Freq: Once | INTRAVENOUS | Status: AC | PRN
  Administered 2022-12-26: 500 [IU]
  Filled 2022-12-26: qty 5

## 2022-12-27 ENCOUNTER — Other Ambulatory Visit: Payer: Self-pay

## 2022-12-27 ENCOUNTER — Telehealth: Payer: Self-pay

## 2022-12-27 ENCOUNTER — Ambulatory Visit
Admission: RE | Admit: 2022-12-27 | Discharge: 2022-12-27 | Disposition: A | Discharge status: Home / Self Care | Payer: PPO | Source: Ambulatory Visit | Attending: Radiation Oncology | Admitting: Radiation Oncology

## 2022-12-27 DIAGNOSIS — Z510 Encounter for antineoplastic radiation therapy: Secondary | ICD-10-CM | POA: Diagnosis not present

## 2022-12-27 LAB — RAD ONC ARIA SESSION SUMMARY
Course Elapsed Days: 9
Plan Fractions Treated to Date: 7
Plan Prescribed Dose Per Fraction: 3 Gy
Plan Total Fractions Prescribed: 10
Plan Total Prescribed Dose: 30 Gy
Reference Point Dosage Given to Date: 21 Gy
Reference Point Session Dosage Given: 3 Gy
Session Number: 7

## 2022-12-27 NOTE — Telephone Encounter (Signed)
Called and spoke with Sterling Big with Client Services through [facility] on phone (12/27/2022), to confirm that [facility] was able to retrieve the specimen required to complete the [facility] Cdx test Dr. Alena Bills requested. She told me that they sent out for specimen retrieval on 12/19/2022.

## 2022-12-30 ENCOUNTER — Other Ambulatory Visit: Payer: Self-pay

## 2022-12-30 ENCOUNTER — Ambulatory Visit
Admission: RE | Admit: 2022-12-30 | Discharge: 2022-12-30 | Disposition: A | Discharge status: Home / Self Care | Payer: PPO | Source: Ambulatory Visit | Attending: Radiation Oncology | Admitting: Radiation Oncology

## 2022-12-30 DIAGNOSIS — Z510 Encounter for antineoplastic radiation therapy: Secondary | ICD-10-CM | POA: Diagnosis not present

## 2022-12-30 LAB — RAD ONC ARIA SESSION SUMMARY
Course Elapsed Days: 12
Plan Fractions Treated to Date: 8
Plan Prescribed Dose Per Fraction: 3 Gy
Plan Total Fractions Prescribed: 10
Plan Total Prescribed Dose: 30 Gy
Reference Point Dosage Given to Date: 24 Gy
Reference Point Session Dosage Given: 3 Gy
Session Number: 8

## 2022-12-31 ENCOUNTER — Ambulatory Visit: Payer: PPO

## 2022-12-31 ENCOUNTER — Other Ambulatory Visit: Payer: Self-pay | Admitting: Internal Medicine

## 2022-12-31 ENCOUNTER — Ambulatory Visit
Admission: RE | Admit: 2022-12-31 | Discharge: 2022-12-31 | Disposition: A | Discharge status: Home / Self Care | Payer: PPO | Source: Ambulatory Visit | Attending: Radiation Oncology | Admitting: Radiation Oncology

## 2022-12-31 ENCOUNTER — Other Ambulatory Visit: Payer: Self-pay | Admitting: Family Medicine

## 2022-12-31 ENCOUNTER — Other Ambulatory Visit: Payer: Self-pay

## 2022-12-31 DIAGNOSIS — Z510 Encounter for antineoplastic radiation therapy: Secondary | ICD-10-CM | POA: Diagnosis not present

## 2022-12-31 DIAGNOSIS — I10 Essential (primary) hypertension: Secondary | ICD-10-CM

## 2022-12-31 LAB — RAD ONC ARIA SESSION SUMMARY
Course Elapsed Days: 13
Plan Fractions Treated to Date: 9
Plan Prescribed Dose Per Fraction: 3 Gy
Plan Total Fractions Prescribed: 10
Plan Total Prescribed Dose: 30 Gy
Reference Point Dosage Given to Date: 27 Gy
Reference Point Session Dosage Given: 3 Gy
Session Number: 9

## 2023-01-01 ENCOUNTER — Ambulatory Visit
Admission: RE | Admit: 2023-01-01 | Discharge: 2023-01-01 | Disposition: A | Discharge status: Home / Self Care | Payer: PPO | Source: Ambulatory Visit | Attending: Radiation Oncology | Admitting: Radiation Oncology

## 2023-01-01 ENCOUNTER — Encounter: Payer: Self-pay | Admitting: Internal Medicine

## 2023-01-01 ENCOUNTER — Other Ambulatory Visit: Payer: Self-pay

## 2023-01-01 DIAGNOSIS — Z510 Encounter for antineoplastic radiation therapy: Secondary | ICD-10-CM | POA: Diagnosis not present

## 2023-01-01 LAB — RAD ONC ARIA SESSION SUMMARY
Course Elapsed Days: 14
Plan Fractions Treated to Date: 10
Plan Prescribed Dose Per Fraction: 3 Gy
Plan Total Fractions Prescribed: 10
Plan Total Prescribed Dose: 30 Gy
Reference Point Dosage Given to Date: 30 Gy
Reference Point Session Dosage Given: 3 Gy
Session Number: 10

## 2023-01-02 ENCOUNTER — Other Ambulatory Visit: Payer: Self-pay | Admitting: *Deleted

## 2023-01-02 ENCOUNTER — Inpatient Hospital Stay: Payer: PPO

## 2023-01-02 ENCOUNTER — Inpatient Hospital Stay (HOSPITAL_BASED_OUTPATIENT_CLINIC_OR_DEPARTMENT_OTHER): Payer: PPO | Admitting: Hospice and Palliative Medicine

## 2023-01-02 ENCOUNTER — Encounter: Payer: Self-pay | Admitting: Internal Medicine

## 2023-01-02 VITALS — BP 121/79 | HR 102 | Temp 99.3°F | Resp 20 | Ht 59.0 in | Wt 139.3 lb

## 2023-01-02 VITALS — BP 121/79 | HR 102 | Temp 100.2°F | Resp 20

## 2023-01-02 DIAGNOSIS — N189 Chronic kidney disease, unspecified: Secondary | ICD-10-CM | POA: Diagnosis not present

## 2023-01-02 DIAGNOSIS — C3490 Malignant neoplasm of unspecified part of unspecified bronchus or lung: Secondary | ICD-10-CM | POA: Diagnosis not present

## 2023-01-02 DIAGNOSIS — R509 Fever, unspecified: Secondary | ICD-10-CM

## 2023-01-02 DIAGNOSIS — B029 Zoster without complications: Secondary | ICD-10-CM

## 2023-01-02 DIAGNOSIS — Z8616 Personal history of COVID-19: Secondary | ICD-10-CM | POA: Diagnosis not present

## 2023-01-02 DIAGNOSIS — I700 Atherosclerosis of aorta: Secondary | ICD-10-CM | POA: Diagnosis not present

## 2023-01-02 DIAGNOSIS — C7931 Secondary malignant neoplasm of brain: Secondary | ICD-10-CM | POA: Diagnosis not present

## 2023-01-02 DIAGNOSIS — I129 Hypertensive chronic kidney disease with stage 1 through stage 4 chronic kidney disease, or unspecified chronic kidney disease: Secondary | ICD-10-CM | POA: Diagnosis not present

## 2023-01-02 DIAGNOSIS — K8020 Calculus of gallbladder without cholecystitis without obstruction: Secondary | ICD-10-CM | POA: Diagnosis not present

## 2023-01-02 DIAGNOSIS — Z79899 Other long term (current) drug therapy: Secondary | ICD-10-CM | POA: Diagnosis not present

## 2023-01-02 DIAGNOSIS — Z5111 Encounter for antineoplastic chemotherapy: Secondary | ICD-10-CM | POA: Diagnosis present

## 2023-01-02 DIAGNOSIS — R350 Frequency of micturition: Secondary | ICD-10-CM | POA: Diagnosis not present

## 2023-01-02 DIAGNOSIS — R300 Dysuria: Secondary | ICD-10-CM

## 2023-01-02 DIAGNOSIS — R21 Rash and other nonspecific skin eruption: Secondary | ICD-10-CM | POA: Diagnosis not present

## 2023-01-02 DIAGNOSIS — C3412 Malignant neoplasm of upper lobe, left bronchus or lung: Secondary | ICD-10-CM | POA: Diagnosis not present

## 2023-01-02 DIAGNOSIS — C7951 Secondary malignant neoplasm of bone: Secondary | ICD-10-CM | POA: Diagnosis not present

## 2023-01-02 DIAGNOSIS — R609 Edema, unspecified: Secondary | ICD-10-CM | POA: Diagnosis not present

## 2023-01-02 LAB — COMPREHENSIVE METABOLIC PANEL
ALT: 14 U/L (ref 0–44)
AST: 18 U/L (ref 15–41)
Albumin: 3.5 g/dL (ref 3.5–5.0)
Alkaline Phosphatase: 246 U/L — ABNORMAL HIGH (ref 38–126)
Anion gap: 6 (ref 5–15)
BUN: 16 mg/dL (ref 8–23)
CO2: 20 mmol/L — ABNORMAL LOW (ref 22–32)
Calcium: 8.4 mg/dL — ABNORMAL LOW (ref 8.9–10.3)
Chloride: 105 mmol/L (ref 98–111)
Creatinine, Ser: 1.3 mg/dL — ABNORMAL HIGH (ref 0.44–1.00)
GFR, Estimated: 44 mL/min — ABNORMAL LOW (ref >60)
Glucose, Bld: 117 mg/dL — ABNORMAL HIGH (ref 70–99)
Potassium: 4.1 mmol/L (ref 3.5–5.1)
Sodium: 131 mmol/L — ABNORMAL LOW (ref 135–145)
Total Bilirubin: 0.4 mg/dL (ref 0.3–1.2)
Total Protein: 6.7 g/dL (ref 6.5–8.1)

## 2023-01-02 LAB — CBC WITH DIFFERENTIAL/PLATELET
Abs Immature Granulocytes: 0.16 10*3/uL — ABNORMAL HIGH (ref 0.00–0.07)
Basophils Absolute: 0 10*3/uL (ref 0.0–0.1)
Basophils Relative: 0 %
Eosinophils Absolute: 0 10*3/uL (ref 0.0–0.5)
Eosinophils Relative: 0 %
HCT: 30.1 % — ABNORMAL LOW (ref 36.0–46.0)
Hemoglobin: 9.6 g/dL — ABNORMAL LOW (ref 12.0–15.0)
Immature Granulocytes: 2 %
Lymphocytes Relative: 10 %
Lymphs Abs: 0.7 10*3/uL (ref 0.7–4.0)
MCH: 34.5 pg — ABNORMAL HIGH (ref 26.0–34.0)
MCHC: 31.9 g/dL (ref 30.0–36.0)
MCV: 108.3 fL — ABNORMAL HIGH (ref 80.0–100.0)
Monocytes Absolute: 0.9 10*3/uL (ref 0.1–1.0)
Monocytes Relative: 13 %
Neutro Abs: 5.1 10*3/uL (ref 1.7–7.7)
Neutrophils Relative %: 75 %
Platelets: 111 10*3/uL — ABNORMAL LOW (ref 150–400)
RBC: 2.78 MIL/uL — ABNORMAL LOW (ref 3.87–5.11)
RDW: 15.6 % — ABNORMAL HIGH (ref 11.5–15.5)
WBC: 6.9 10*3/uL (ref 4.0–10.5)
nRBC: 0 % (ref 0.0–0.2)

## 2023-01-02 LAB — URINALYSIS, COMPLETE (UACMP) WITH MICROSCOPIC
Bacteria, UA: NONE SEEN
Bilirubin Urine: NEGATIVE
Glucose, UA: NEGATIVE mg/dL
Ketones, ur: NEGATIVE mg/dL
Leukocytes,Ua: NEGATIVE
Nitrite: NEGATIVE
Protein, ur: NEGATIVE mg/dL
Specific Gravity, Urine: 1.01 (ref 1.005–1.030)
pH: 6 (ref 5.0–8.0)

## 2023-01-02 MED ORDER — HEPARIN SOD (PORK) LOCK FLUSH 100 UNIT/ML IV SOLN
500.0000 [IU] | Freq: Once | INTRAVENOUS | Status: AC | PRN
  Administered 2023-01-02: 500 [IU]
  Filled 2023-01-02: qty 5

## 2023-01-02 MED ORDER — VALACYCLOVIR HCL 1 G PO TABS: 1000.0000 mg | ORAL_TABLET | Freq: Three times a day (TID) | ORAL | 0 refills | Status: DC

## 2023-01-02 MED ORDER — SODIUM CHLORIDE 0.9% FLUSH
10.0000 mL | Freq: Once | INTRAVENOUS | Status: AC | PRN
  Administered 2023-01-02: 10 mL
  Filled 2023-01-02: qty 10

## 2023-01-02 MED ORDER — SODIUM CHLORIDE 0.9 % IV SOLN
Freq: Once | INTRAVENOUS | Status: AC
  Filled 2023-01-02: qty 250

## 2023-01-02 NOTE — Progress Notes (Signed)
Pt here for IVF today. Upon arrival pt reported that she developed a rash on her back on the right side. Not itching, but had pain at site one day prior to breakout. Josh Borders NP to see pt

## 2023-01-02 NOTE — Progress Notes (Signed)
[facility] at [facility] [phone] [phone]  Patient Care Team: Dana Allan, MD as PCP - General (Family Medicine) Glory Buff, RN as Oncology Nurse Navigator Michaelyn Barter, MD as Consulting Physician (Oncology)   NAME OF PATIENT: Victoria Foley  [id number]  Sep 05, 1950   DATE OF VISIT: 01/02/23  REASON FOR CONSULT: Victoria Foley is a 71 y.o. 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  Patient is status post RT to the right hip due to nondisplaced pathologic fracture.  She is status post palliative RT to brain.  She is on systemic chemotherapy.  INTERVAL HISTORY: Patient on treatment with carboplatin and paclitaxel and received cycle 4 on 12/19/2022.  Patient was here for IV fluids and was an add-on to [facility] for evaluation of development of a painful rash on her back, which started yesterday.  Patient says it is not hurting currently but she did experience some pain yesterday.  Denies burning or tingling.  Denies fever or chills.  No GI symptoms.  She does endorse urinary frequency but denies burning or urgency. Patient offers no further specific complaints today.   PAST MEDICAL HISTORY: Past Medical History:  Diagnosis Date   Anemia    Anxiety    Arthritis    Cancer (HCC)    Lung, 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   COVID-19    03/2021   Hypertension    Pneumonia    age 56   Pre-diabetes    Prediabetes    Sciatica     PAST SURGICAL HISTORY:  Past Surgical History:  Procedure Laterality Date   BRONCHIAL BIOPSY  12/17/2022   Procedure: BRONCHIAL BIOPSIES;  Surgeon: Josephine Igo, DO;  Location: [facility] ENDOSCOPY;  Service: Pulmonary;;   BRONCHIAL NEEDLE ASPIRATION BIOPSY  12/17/2022   Procedure: BRONCHIAL NEEDLE  ASPIRATION BIOPSIES;  Surgeon: Josephine Igo, DO;  Location: [facility] ENDOSCOPY;  Service: Pulmonary;;   CESAREAN SECTION     COLONOSCOPY W/ POLYPECTOMY     ECTOPIC PREGNANCY SURGERY     FEMUR SURGERY     left, s/p rod for fracture   IR IMAGING GUIDED PORT INSERTION  12/11/2021   TUBAL LIGATION      HEMATOLOGY/ONCOLOGY HISTORY:  Oncology History  Primary lung adenocarcinoma (HCC)  10/10/2021 Initial Diagnosis   Primary lung adenocarcinoma (HCC) Stage IV  Patient seen by PCP for wheezing for 2 weeks. Imaging with CXR followed by CT chest showed Spiculated 2.1 x 2.2 cm left upper lobe pulmonary nodule with associated pleural tethering. Innumerable subcentimeter pulmonary nodules throughout the lungs.      11/14/2021 PET scan   IMPRESSION: 1. Left suprahilar nodule with maximum SUV 4.5, and a more distal 2.2 cm left upper lobe nodule with maximum SUV of 5.5. 2. Innumerable scattered small pulmonary nodules, most of which are under 5 mm in diameter, throughout both lungs. Some are cavitary. Possibilities include cavitating hematogenous disseminated malignancy versus infectious etiology subset as septic emboli. Most of the nodules are stable, some are minimally enlarged compared to 10/10/2021. 3. Abnormal mild permeative type bony findings in the right hemipelvis associated with substantially accentuated metabolic activity. Possible associated pathologic fracture through the right quadrilateral plate and right acetabulum. The findings involve the right ilium and ischium but also the right lateral sacral ala and a small focus in  the left sacrum.    Pathology Results   She had transbronchial biopsies by Dr. Jayme Cloud on 11/19/21.  Pathology from LUL nodule showed adenocarcinoma, consistent with lung primary.    11/19/2021 Cancer Staging   Staging form: Lung, AJCC 8th Edition - Clinical stage from 11/19/2021: Stage IVB (cT1c, cM1c) - Signed by Michaelyn Barter, MD on 11/27/2021 Stage prefix:  Initial diagnosis   11/27/2021 Imaging   MRI pelvis  IMPRESSION: 1. Abnormal marrow signal throughout the right ilium involving the right acetabulum, right superior pubic ramus and right inferior pubic ramus, right side of the sacrum and a smaller focus of abnormal marrow lesion in the left side of the sacrum. Findings are concerning for metastatic disease. 2. Nondisplaced pathologic fracture of the quadrilateral plate of the right acetabulum. 3. Mild muscle edema in the right gluteus minimus and medius muscles likely reflecting mild muscle strain.  MRI brain  IMPRESSION: No evidence of intracranial metastatic disease.   Indeterminate 1 cm left frontal calvarium lesion.   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  MEDICATIONS:  Current Outpatient Medications  Medication Sig Dispense Refill   albuterol (VENTOLIN HFA) 108 (90 Base) MCG/ACT inhaler Inhale 2 puffs into the lungs every 6 (six) hours as needed for wheezing or shortness of breath. (Patient not taking: Reported on 12/19/2022) 18 g 0   Azelastine HCl 137 MCG/SPRAY SOLN Place 2 sprays into both nostrils 2 (two) times daily.     Cholecalciferol (VITAMIN D-3 PO) Take by mouth. (Patient not taking: Reported on 12/04/2022)     dexamethasone (DECADRON) 4  MG tablet Take 1 tablet (4 mg total) by mouth daily. Take for 3 days after chemotherapy (Patient not taking: Reported on 12/04/2022) 30 tablet 0   fluticasone (FLONASE) 50 MCG/ACT nasal spray USE TWO SPRAYS IN EACH NOSTRIL ONCE DAILY prn (Patient not taking: Reported on 11/29/2022) 16 g 12   HYDROcodone-acetaminophen (NORCO/VICODIN) 5-325 MG tablet Take by mouth. (Patient not taking: Reported on 11/25/2022)     loratadine (CLARITIN) 10 MG tablet TAKE ONE TABLET BY MOUTH DAILY AS NEEDED FOR ALLERGIES (Patient not taking: Reported on 11/29/2022) 90 tablet 3   losartan (COZAAR) 100 MG tablet TAKE 1 TABLET BY MOUTH DAILY 30 tablet 0   magic mouthwash w/lidocaine SOLN Take 5 mLs by mouth 4 (four) times daily as needed for mouth pain. Sig: Swish/Swallow 5-10 ml four times a day as needed. Dispense 480 ml. 1RF 480 mL 1   magic mouthwash w/lidocaine SOLN Take 5 mLs by mouth 4 (four) times daily as needed for mouth pain. Sig: Swish/Swallow 5-10 ml four times a day as needed. Dispense 480 ml. 1RF 480 mL 1   MULTIPLE VITAMIN PO Take 1 tablet by mouth daily. (Patient not taking: Reported on 11/29/2022)  nebivolol (BYSTOLIC) 2.5 MG tablet TAKE 1 TABLET BY MOUTH DAILY 90 tablet 0   ondansetron (ZOFRAN) 8 MG tablet Take 1 tablet (8 mg total) by mouth every 8 (eight) hours as needed for nausea or vomiting. (Patient not taking: Reported on 12/04/2022) 30 tablet 3   traMADol (ULTRAM) 50 MG tablet TAKE 1 TABLET BY MOUTH EVERY 6 HOURS AS NEEDED 60 tablet 0   traMADol HCl 25 MG TABS Take 1 tablet by mouth every 6 (six) hours as needed for up to 90 doses. Please add with your 50 mg tramadol to make it 75 mg every 6 hours as needed for pain. 90 tablet 0   No current facility-administered medications for this visit.   Facility-Administered Medications Ordered in Other Visits  Medication Dose Route Frequency Provider Last Rate Last Admin   0.9 %  sodium chloride infusion   Intravenous Once Alinda Dooms, NP       heparin lock  flush 100 UNIT/ML injection            heparin lock flush 100 unit/mL  500 Units Intravenous Once Michaelyn Barter, MD       heparin lock flush 100 unit/mL  500 Units Intracatheter Once PRN Michaelyn Barter, MD       sodium chloride flush (NS) 0.9 % injection 10 mL  10 mL Intravenous Once Michaelyn Barter, MD        VITAL SIGNS: There were no vitals taken for this visit. There were no vitals filed for this visit.   Estimated body mass index is 28.28 kg/m as calculated from the following:   Height as of 12/17/22: 4\' 11"  (1.499 m).   Weight as of 12/19/22: 140 lb (63.5 kg).  LABS: CBC:    Component Value Date/Time   WBC 6.9 01/02/2023 1311   HGB 9.6 (L) 01/02/2023 1311   HGB 9.2 (L) 12/19/2022 0821   HCT 30.1 (L) 01/02/2023 1311   PLT 111 (L) 01/02/2023 1311   PLT 136 (L) 12/19/2022 0821   MCV 108.3 (H) 01/02/2023 1311   NEUTROABS 5.1 01/02/2023 1311   LYMPHSABS 0.7 01/02/2023 1311   MONOABS 0.9 01/02/2023 1311   EOSABS 0.0 01/02/2023 1311   BASOSABS 0.0 01/02/2023 1311   Comprehensive Metabolic Panel:    Component Value Date/Time   NA 131 (L) 01/02/2023 1311   K 4.1 01/02/2023 1311   CL 105 01/02/2023 1311   CO2 20 (L) 01/02/2023 1311   BUN 16 01/02/2023 1311   CREATININE 1.30 (H) 01/02/2023 1311   CREATININE 1.30 (H) 12/19/2022 0821   GLUCOSE 117 (H) 01/02/2023 1311   CALCIUM 8.4 (L) 01/02/2023 1311   AST 18 01/02/2023 1311   AST 18 12/19/2022 0821   ALT 14 01/02/2023 1311   ALT 13 12/19/2022 0821   ALKPHOS 246 (H) 01/02/2023 1311   BILITOT 0.4 01/02/2023 1311   BILITOT 0.3 12/19/2022 0821   PROT 6.7 01/02/2023 1311   ALBUMIN 3.5 01/02/2023 1311    RADIOGRAPHIC STUDIES: DG Chest Port 1 View  Result Date: 12/17/2022 CLINICAL DATA:  Status post bronchoscopy EXAM: PORTABLE CHEST 1 VIEW COMPARISON:  11/19/2021 FINDINGS: Heart size is normal. Right chest port catheter. Spiculated mass of the medial left upper lobe. No pneumothorax or acute appearing airspace opacity.  Osseous structures unremarkable. IMPRESSION: Spiculated mass of the medial left upper lobe. No pneumothorax or acute appearing airspace opacity. Electronically Signed   By: Jearld Lesch M.D.   On: 12/17/2022 13:10   DG C-ARM BRONCHOSCOPY  Result Date: 12/17/2022 C-ARM BRONCHOSCOPY: Fluoroscopy was utilized by the requesting physician.  No radiographic interpretation.   CT Super D Chest Wo Contrast  Result Date: 12/16/2022 CLINICAL DATA:  Stage IV lung cancer.  * Tracking Code: BO * EXAM: CT CHEST, ABDOMEN AND PELVIS WITHOUT CONTRAST TECHNIQUE: Multidetector CT imaging of the chest, abdomen and pelvis was performed following the standard protocol without IV contrast. RADIATION DOSE REDUCTION: This exam was performed according to the departmental dose-optimization program which includes automated exposure control, adjustment of the mA and/or kV according to patient size and/or use of iterative reconstruction technique. COMPARISON:  09/10/2022. FINDINGS: CT CHEST FINDINGS Cardiovascular: Right IJ Port-A-Cath terminates in the right atrium. Atherosclerotic calcification of the aorta. Heart size normal. No pericardial effusion. Mediastinum/Nodes: No pathologically enlarged mediastinal or axillary lymph nodes. Hilar regions are difficult to definitively evaluate without IV contrast. Esophagus is grossly unremarkable. Lungs/Pleura: Spiculated left upper lobe masslike consolidation measures approximately 2.6 x 3.0 cm (4/38), overall minimally decreased from 2.5 x 3.4 cm on 09/10/2022. Upper and midlung zone predominant septal thickening and perilymphatic nodularity have improved slightly in the interval as well. No pleural fluid. Airway is unremarkable. Musculoskeletal: New rounded sclerotic lesion in the T12 vertebral body (4/130). CT ABDOMEN PELVIS FINDINGS Hepatobiliary: Liver is grossly unremarkable. Stones in the gallbladder. No biliary ductal dilatation. Pancreas: Negative. Spleen: Negative. Adrenals/Urinary  Tract: Adrenal glands are unremarkable. Low-attenuation lesion in the right kidney. No specific follow-up necessary. Kidneys are otherwise unremarkable. Ureters are decompressed. Possible nodular bladder wall thickening along the right lateral wall (2/64). Stomach/Bowel: Stomach, small bowel, appendix and colon are unremarkable. Vascular/Lymphatic: Atherosclerotic calcification of the aorta. Reproductive: Uterus is visualized. There may be a uterine fibroid. No adnexal mass. Other: No free fluid.  Mesenteries and peritoneum are unremarkable. Musculoskeletal: Intramedullary rod in the left femur. Cortical regularity involving the left third anterior rib. Sclerotic lesion in the proximal right humerus new fluffy sclerosis in the L1, L3 and L4 vertebral bodies as well as new rounded sclerosis in the left iliac wing. Some of these are associated previously seen lytic lesions. Similar pathologic compression of the L3 superior endplate. Grade 1 anterolisthesis of L4 on L5. Similar heterogeneously sclerotic sacral and right iliac wing metastases. IMPRESSION: 1. 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 2. Several osseous metastatic lesions, many of which show evidence of interval healing. 3. Possible nodular wall thickening along the right lateral bladder wall. Difficult to exclude urothelial carcinoma. Please correlate clinically. 4. Cholelithiasis. 5.  Aortic atherosclerosis (ICD10-I70.0). Electronically Signed   By: Leanna Battles M.D.   On: 12/16/2022 13:49   CT ABDOMEN PELVIS WO CONTRAST  Result Date: 12/16/2022 CLINICAL DATA:  Stage IV lung cancer.  * Tracking Code: BO * EXAM: CT CHEST, ABDOMEN AND PELVIS WITHOUT CONTRAST TECHNIQUE: Multidetector CT imaging of the chest, abdomen and pelvis was performed following the standard protocol without IV contrast. RADIATION DOSE REDUCTION: This exam was performed  according to the departmental dose-optimization program which includes automated exposure control, adjustment of the mA and/or kV according to patient size and/or use of iterative reconstruction technique. COMPARISON:  09/10/2022. FINDINGS: CT CHEST FINDINGS Cardiovascular: Right IJ Port-A-Cath terminates in the right atrium. Atherosclerotic calcification of the aorta. Heart size normal. No pericardial effusion. Mediastinum/Nodes: No pathologically enlarged mediastinal or axillary lymph nodes. Hilar regions are difficult to definitively evaluate without IV contrast. Esophagus is grossly unremarkable. Lungs/Pleura: Spiculated left upper lobe masslike  consolidation measures approximately 2.6 x 3.0 cm (4/38), overall minimally decreased from 2.5 x 3.4 cm on 09/10/2022. Upper and midlung zone predominant septal thickening and perilymphatic nodularity have improved slightly in the interval as well. No pleural fluid. Airway is unremarkable. Musculoskeletal: New rounded sclerotic lesion in the T12 vertebral body (4/130). CT ABDOMEN PELVIS FINDINGS Hepatobiliary: Liver is grossly unremarkable. Stones in the gallbladder. No biliary ductal dilatation. Pancreas: Negative. Spleen: Negative. Adrenals/Urinary Tract: Adrenal glands are unremarkable. Low-attenuation lesion in the right kidney. No specific follow-up necessary. Kidneys are otherwise unremarkable. Ureters are decompressed. Possible nodular bladder wall thickening along the right lateral wall (2/64). Stomach/Bowel: Stomach, small bowel, appendix and colon are unremarkable. Vascular/Lymphatic: Atherosclerotic calcification of the aorta. Reproductive: Uterus is visualized. There may be a uterine fibroid. No adnexal mass. Other: No free fluid.  Mesenteries and peritoneum are unremarkable. Musculoskeletal: Intramedullary rod in the left femur. Cortical regularity involving the left third anterior rib. Sclerotic lesion in the proximal right humerus new fluffy sclerosis in  the L1, L3 and L4 vertebral bodies as well as new rounded sclerosis in the left iliac wing. Some of these are associated previously seen lytic lesions. Similar pathologic compression of the L3 superior endplate. Grade 1 anterolisthesis of L4 on L5. Similar heterogeneously sclerotic sacral and right iliac wing metastases. IMPRESSION: 1. 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 2. Several osseous metastatic lesions, many of which show evidence of interval healing. 3. Possible nodular wall thickening along the right lateral bladder wall. Difficult to exclude urothelial carcinoma. Please correlate clinically. 4. Cholelithiasis. 5.  Aortic atherosclerosis (ICD10-I70.0). Electronically Signed   By: Leanna Battles M.D.   On: 12/16/2022 13:49   NM Bone Scan Whole Body  Result Date: 12/05/2022 CLINICAL DATA:  Non-small-cell lung cancer. EXAM: NUCLEAR MEDICINE WHOLE BODY BONE SCAN TECHNIQUE: Whole body anterior and posterior images were obtained approximately 3 hours after intravenous injection of radiopharmaceutical. RADIOPHARMACEUTICALS:  23.44 mCi Technetium-52m MDP IV COMPARISON:  CT scan without contrast 09/10/2018 chest, abdomen pelvis FINDINGS: Multiple focal areas of abnormal radiotracer uptake identified. This includes along the skull, left-greater-than-right, anterior aspect of the left third rib, right shoulder, left mid humerus, multiple levels in the lumbar spine, left scapula, midthoracic spine as well as scattered in the pelvis and left proximal femur. There is physiologic distribution of radiotracer as well along the kidneys and bladder. IMPRESSION: Multifocal osseous metastatic disease. Electronically Signed   By: Karen Kays M.D.   On: 12/05/2022 16:16    PERFORMANCE STATUS (ECOG) : 1 - Symptomatic but completely ambulatory  Review of Systems Unless otherwise noted, a complete review of  systems is negative.  Physical Exam General: NAD Cardiovascular: regular rate and rhythm Pulmonary: clear ant fields Abdomen: soft, nontender, + bowel sounds GU: no suprapubic tenderness Extremities: no edema, no joint deformities Skin: Dermatomal rash across the back Neurological: Weakness but otherwise nonfocal    IMPRESSION/PLAN: Stage IV lung cancer -on systemic treatment with carbo plus paclitaxel.  Cycle 5 scheduled for 9/19  Shingles -start valacyclovir 1000 mg 3 times daily x 7 days discussed care including keeping rash clean and dry.  Can use cool compresses and loose clothing.  Rash not currently painful but may use tramadol as needed.  Urinary frequency-UA negative for UTI.  Case and plan discussed with Dr. Alena Bills  RTC next week as previously scheduled   Patient expressed understanding and was in agreement with this plan.  She also understands that She can call clinic at any time with any questions, concerns, or complaints.   Thank you for allowing me to participate in the care of this very pleasant patient.   Time Total: 20 minutes  Visit consisted of counseling and education dealing with the complex and emotionally intense issues of symptom management in the setting of serious illness.Greater than 50%  of this time was spent counseling and coordinating care related to the above assessment and plan.  Signed by: Laurette Schimke, PhD, NP-C

## 2023-01-02 NOTE — Patient Instructions (Signed)

## 2023-01-03 ENCOUNTER — Ambulatory Visit
Admission: RE | Admit: 2023-01-03 | Discharge: 2023-01-03 | Disposition: A | Discharge status: Home / Self Care | Payer: PPO | Source: Ambulatory Visit | Attending: Internal Medicine

## 2023-01-03 ENCOUNTER — Encounter: Payer: Self-pay | Admitting: Internal Medicine

## 2023-01-03 DIAGNOSIS — C7931 Secondary malignant neoplasm of brain: Secondary | ICD-10-CM | POA: Insufficient documentation

## 2023-01-03 LAB — URINE CULTURE: Culture: NO GROWTH

## 2023-01-03 MED ORDER — GADOBUTROL 1 MMOL/ML IV SOLN
6.0000 mL | Freq: Once | INTRAVENOUS | Status: AC | PRN
  Administered 2023-01-03: 6 mL via INTRAVENOUS

## 2023-01-06 ENCOUNTER — Encounter (HOSPITAL_COMMUNITY): Payer: Self-pay | Admitting: Internal Medicine

## 2023-01-08 ENCOUNTER — Encounter: Payer: Self-pay | Admitting: Internal Medicine

## 2023-01-08 MED FILL — Fosaprepitant Dimeglumine For IV Infusion 150 MG (Base Eq): INTRAVENOUS | Qty: 5 | Status: AC

## 2023-01-08 MED FILL — Dexamethasone Sodium Phosphate Inj 100 MG/10ML: INTRAMUSCULAR | Qty: 1 | Status: AC

## 2023-01-09 ENCOUNTER — Inpatient Hospital Stay: Payer: PPO | Admitting: Nurse Practitioner

## 2023-01-09 ENCOUNTER — Inpatient Hospital Stay: Payer: PPO

## 2023-01-09 ENCOUNTER — Encounter: Payer: Self-pay | Admitting: Nurse Practitioner

## 2023-01-09 VITALS — BP 134/71 | HR 86 | Temp 97.8°F | Wt 136.4 lb

## 2023-01-09 DIAGNOSIS — N3289 Other specified disorders of bladder: Secondary | ICD-10-CM

## 2023-01-09 DIAGNOSIS — C3490 Malignant neoplasm of unspecified part of unspecified bronchus or lung: Secondary | ICD-10-CM

## 2023-01-09 DIAGNOSIS — Z5111 Encounter for antineoplastic chemotherapy: Secondary | ICD-10-CM | POA: Diagnosis not present

## 2023-01-09 LAB — CMP (CANCER CENTER ONLY)
ALT: 13 U/L (ref 0–44)
AST: 19 U/L (ref 15–41)
Albumin: 3.3 g/dL — ABNORMAL LOW (ref 3.5–5.0)
Alkaline Phosphatase: 214 U/L — ABNORMAL HIGH (ref 38–126)
Anion gap: 8 (ref 5–15)
BUN: 21 mg/dL (ref 8–23)
CO2: 19 mmol/L — ABNORMAL LOW (ref 22–32)
Calcium: 8.9 mg/dL (ref 8.9–10.3)
Chloride: 109 mmol/L (ref 98–111)
Creatinine: 1.29 mg/dL — ABNORMAL HIGH (ref 0.44–1.00)
GFR, Estimated: 44 mL/min — ABNORMAL LOW (ref >60)
Glucose, Bld: 99 mg/dL (ref 70–99)
Potassium: 4 mmol/L (ref 3.5–5.1)
Sodium: 136 mmol/L (ref 135–145)
Total Bilirubin: 0.4 mg/dL (ref 0.3–1.2)
Total Protein: 6.7 g/dL (ref 6.5–8.1)

## 2023-01-09 LAB — CBC WITH DIFFERENTIAL (CANCER CENTER ONLY)
Abs Immature Granulocytes: 0.01 10*3/uL (ref 0.00–0.07)
Basophils Absolute: 0 10*3/uL (ref 0.0–0.1)
Basophils Relative: 1 %
Eosinophils Absolute: 0.1 10*3/uL (ref 0.0–0.5)
Eosinophils Relative: 2 %
HCT: 27.4 % — ABNORMAL LOW (ref 36.0–46.0)
Hemoglobin: 8.8 g/dL — ABNORMAL LOW (ref 12.0–15.0)
Immature Granulocytes: 0 %
Lymphocytes Relative: 24 %
Lymphs Abs: 0.6 10*3/uL — ABNORMAL LOW (ref 0.7–4.0)
MCH: 34.6 pg — ABNORMAL HIGH (ref 26.0–34.0)
MCHC: 32.1 g/dL (ref 30.0–36.0)
MCV: 107.9 fL — ABNORMAL HIGH (ref 80.0–100.0)
Monocytes Absolute: 0.4 10*3/uL (ref 0.1–1.0)
Monocytes Relative: 16 %
Neutro Abs: 1.5 10*3/uL — ABNORMAL LOW (ref 1.7–7.7)
Neutrophils Relative %: 57 %
Platelet Count: 95 10*3/uL — ABNORMAL LOW (ref 150–400)
RBC: 2.54 MIL/uL — ABNORMAL LOW (ref 3.87–5.11)
RDW: 14.6 % (ref 11.5–15.5)
WBC Count: 2.6 10*3/uL — ABNORMAL LOW (ref 4.0–10.5)
nRBC: 0 % (ref 0.0–0.2)

## 2023-01-09 MED ORDER — OLANZAPINE 5 MG PO TABS: 5.0000 mg | ORAL_TABLET | Freq: Every day | ORAL | 0 refills | Status: DC

## 2023-01-09 MED ORDER — HEPARIN SOD (PORK) LOCK FLUSH 100 UNIT/ML IV SOLN
500.0000 [IU] | Freq: Once | INTRAVENOUS | Status: DC | PRN
  Filled 2023-01-09: qty 5

## 2023-01-09 MED ORDER — VALACYCLOVIR HCL 500 MG PO TABS: 500.0000 mg | ORAL_TABLET | Freq: Two times a day (BID) | ORAL | 1 refills | Status: DC

## 2023-01-09 MED ORDER — SODIUM CHLORIDE 0.9 % IV SOLN
Freq: Once | INTRAVENOUS | Status: AC
  Filled 2023-01-09: qty 250

## 2023-01-09 NOTE — Progress Notes (Signed)
Nutrition  RD planning on seeing patient in infusion.  No treatment today.  RD was not able to see before patient leaving clinic.   Will follow-up on 9/27  Rodel Glaspy B. Freida Busman, RD, LDN Registered Dietitian [phone]

## 2023-01-09 NOTE — Progress Notes (Signed)
[facility] OFFICE PROGRESS NOTE  Patient Care Team: Dana Allan, MD as PCP - General (Family Medicine) Glory Buff, RN as Oncology Nurse Navigator Michaelyn Barter, MD as Consulting Physician (Oncology)  History of Presenting Illness Oncology History  Primary lung adenocarcinoma [facility])  10/10/2021 Initial Diagnosis   Primary lung adenocarcinoma (HCC) Stage IV  Patient seen by PCP for wheezing for 2 weeks. Imaging with CXR followed by CT chest showed Spiculated 2.1 x 2.2 cm left upper lobe pulmonary nodule with associated pleural tethering. Innumerable subcentimeter pulmonary nodules throughout the lungs.      11/14/2021 PET scan   IMPRESSION: 1. Left suprahilar nodule with maximum SUV 4.5, and a more distal 2.2 cm left upper lobe nodule with maximum SUV of 5.5. 2. Innumerable scattered small pulmonary nodules, most of which are under 5 mm in diameter, throughout both lungs. Some are cavitary. Possibilities include cavitating hematogenous disseminated malignancy versus infectious etiology subset as septic emboli. Most of the nodules are stable, some are minimally enlarged compared to 10/10/2021. 3. Abnormal mild permeative type bony findings in the right hemipelvis associated with substantially accentuated metabolic activity. Possible associated pathologic fracture through the right quadrilateral plate and right acetabulum. The findings involve the right ilium and ischium but also the right lateral sacral ala and a small focus in the left sacrum.    Pathology Results   She had transbronchial biopsies by Dr. Jayme Cloud on 11/19/21.  Pathology from LUL nodule showed adenocarcinoma, consistent with lung primary.    11/19/2021 Cancer Staging   Staging form: Lung, AJCC 8th Edition - Clinical stage from 11/19/2021: Stage IVB (cT1c, cM1c) - Signed by Michaelyn Barter, MD on 11/27/2021 Stage prefix: Initial diagnosis   11/27/2021 Imaging   MRI pelvis  IMPRESSION: 1. Abnormal  marrow signal throughout the right ilium involving the right acetabulum, right superior pubic ramus and right inferior pubic ramus, right side of the sacrum and a smaller focus of abnormal marrow lesion in the left side of the sacrum. Findings are concerning for metastatic disease. 2. Nondisplaced pathologic fracture of the quadrilateral plate of the right acetabulum. 3. Mild muscle edema in the right gluteus minimus and medius muscles likely reflecting mild muscle strain.  MRI brain  IMPRESSION: No evidence of intracranial metastatic disease.   Indeterminate 1 cm left frontal calvarium lesion.   12/18/2021 - 08/27/2022 Chemotherapy   Patient is on Treatment Plan : LUNG Carboplatin (5) + Pemetrexed (500) + Pembrolizumab (200) D1 q21d Induction x 4 cycles / Maintenance Pemetrexed (500) + Pembrolizumab (200) D1 q21d     10/07/2022 -  Chemotherapy   Patient is on Treatment Plan : LUNG Carboplatin + Paclitaxel q21d Dose Reduction     Primary malignant neoplasm of left upper lobe of lung (HCC)  06/04/2022 Initial Diagnosis   Primary malignant neoplasm of left upper lobe of lung (HCC)   06/04/2022 Cancer Staging   Staging form: Lung, AJCC 8th Edition - Pathologic: Stage IVB (pT1c, pNX, cM1c) - Signed by Earna Coder, MD on 06/04/2022     [facility]- 12/06/2021   Oncology History  Primary lung adenocarcinoma (HCC)  10/10/2021 Initial Diagnosis   Primary lung adenocarcinoma (HCC) Stage IV  Patient seen by PCP for wheezing for 2 weeks. Imaging with CXR followed by CT chest showed Spiculated 2.1 x 2.2 cm left upper lobe pulmonary nodule with associated pleural tethering. Innumerable subcentimeter pulmonary nodules throughout the lungs.      11/14/2021 PET scan   IMPRESSION: 1. Left suprahilar  nodule with maximum SUV 4.5, and a more distal 2.2 cm left upper lobe nodule with maximum SUV of 5.5. 2. Innumerable scattered small pulmonary nodules, most of which are under 5 mm in  diameter, throughout both lungs. Some are cavitary. Possibilities include cavitating hematogenous disseminated malignancy versus infectious etiology subset as septic emboli. Most of the nodules are stable, some are minimally enlarged compared to 10/10/2021. 3. Abnormal mild permeative type bony findings in the right hemipelvis associated with substantially accentuated metabolic activity. Possible associated pathologic fracture through the right quadrilateral plate and right acetabulum. The findings involve the right ilium and ischium but also the right lateral sacral ala and a small focus in the left sacrum.    Pathology Results   She had transbronchial biopsies by Dr. Jayme Cloud on 11/19/21.  Pathology from LUL nodule showed adenocarcinoma, consistent with lung primary.    11/19/2021 Cancer Staging   Staging form: Lung, AJCC 8th Edition - Clinical stage from 11/19/2021: Stage IVB (cT1c, cM1c) - Signed by Michaelyn Barter, MD on 11/27/2021 Stage prefix: Initial diagnosis   11/27/2021 Imaging   MRI pelvis  IMPRESSION: 1. Abnormal marrow signal throughout the right ilium involving the right acetabulum, right superior pubic ramus and right inferior pubic ramus, right side of the sacrum and a smaller focus of abnormal marrow lesion in the left side of the sacrum. Findings are concerning for metastatic disease. 2. Nondisplaced pathologic fracture of the quadrilateral plate of the right acetabulum. 3. Mild muscle edema in the right gluteus minimus and medius muscles likely reflecting mild muscle strain.  MRI brain  IMPRESSION: No evidence of intracranial metastatic disease.   Indeterminate 1 cm left frontal calvarium lesion.   12/18/2021 - 08/27/2022 Chemotherapy   Patient is on Treatment Plan : LUNG Carboplatin (5) + Pemetrexed (500) + Pembrolizumab (200) D1 q21d Induction x 4 cycles / Maintenance Pemetrexed (500) + Pembrolizumab (200) D1 q21d     10/07/2022 -  Chemotherapy   Patient is on  Treatment Plan : LUNG Carboplatin + Paclitaxel q21d Dose Reduction     Primary malignant neoplasm of left upper lobe of lung (HCC)  06/04/2022 Initial Diagnosis   Primary malignant neoplasm of left upper lobe of lung (HCC)   06/04/2022 Cancer Staging   Staging form: Lung, AJCC 8th Edition - Pathologic: Stage IVB (pT1c, pNX, cM1c) - Signed by Earna Coder, MD on 06/04/2022      INTERVAL HISTORY: Patient returns to clinic for evaluation and consideration of cycle 5 of carbo-taxol chemotherapy. She continues to tolerate treatment well. Continues to lose weight and daughters say her appetite is reduced. Energy levels wax and wane. Sleeps well. Has chronic left hip pain and uses a cane.    REVIEW OF SYSTEMS:   Review of Systems  Constitutional:  Positive for malaise/fatigue and weight loss. Negative for chills and fever.  HENT:  Negative for hearing loss, nosebleeds, sore throat and tinnitus.   Eyes:  Negative for blurred vision and double vision.  Respiratory:  Negative for cough, hemoptysis, shortness of breath and wheezing.   Cardiovascular:  Negative for chest pain, palpitations and leg swelling.  Gastrointestinal:  Negative for abdominal pain, blood in stool, constipation, diarrhea, melena, nausea and vomiting.  Genitourinary:  Negative for dysuria and urgency.  Musculoskeletal:  Positive for joint pain. Negative for back pain, falls and myalgias.  Skin:  Negative for itching and rash.  Neurological:  Negative for dizziness, tingling, sensory change, loss of consciousness, weakness and headaches.  Endo/Heme/Allergies:  Negative  for environmental allergies. Does not bruise/bleed easily.  Psychiatric/Behavioral:  Negative for depression. The patient is not nervous/anxious and does not have insomnia.    ALLERGIES:  is allergic to diclofenac, lisinopril, and sulfa antibiotics.  MEDICATIONS:  Current Outpatient Medications  Medication Sig Dispense Refill   Azelastine HCl 137  MCG/SPRAY SOLN Place 2 sprays into both nostrils 2 (two) times daily.     losartan (COZAAR) 100 MG tablet TAKE 1 TABLET BY MOUTH DAILY 30 tablet 0   nebivolol (BYSTOLIC) 2.5 MG tablet TAKE 1 TABLET BY MOUTH DAILY 90 tablet 0   traMADol (ULTRAM) 50 MG tablet TAKE 1 TABLET BY MOUTH EVERY 6 HOURS AS NEEDED 60 tablet 0   traMADol HCl 25 MG TABS Take 1 tablet by mouth every 6 (six) hours as needed for up to 90 doses. Please add with your 50 mg tramadol to make it 75 mg every 6 hours as needed for pain. 90 tablet 0   valACYclovir (VALTREX) 1000 MG tablet Take 1 tablet (1,000 mg total) by mouth 3 (three) times daily. 21 tablet 0   albuterol (VENTOLIN HFA) 108 (90 Base) MCG/ACT inhaler Inhale 2 puffs into the lungs every 6 (six) hours as needed for wheezing or shortness of breath. (Patient not taking: Reported on 12/19/2022) 18 g 0   Cholecalciferol (VITAMIN D-3 PO) Take by mouth. (Patient not taking: Reported on 12/04/2022)     dexamethasone (DECADRON) 4 MG tablet Take 1 tablet (4 mg total) by mouth daily. Take for 3 days after chemotherapy (Patient not taking: Reported on 12/04/2022) 30 tablet 0   fluticasone (FLONASE) 50 MCG/ACT nasal spray USE TWO SPRAYS IN EACH NOSTRIL ONCE DAILY prn (Patient not taking: Reported on 11/29/2022) 16 g 12   HYDROcodone-acetaminophen (NORCO/VICODIN) 5-325 MG tablet Take by mouth. (Patient not taking: Reported on 11/25/2022)     loratadine (CLARITIN) 10 MG tablet TAKE ONE TABLET BY MOUTH DAILY AS NEEDED FOR ALLERGIES (Patient not taking: Reported on 11/29/2022) 90 tablet 3   magic mouthwash w/lidocaine SOLN Take 5 mLs by mouth 4 (four) times daily as needed for mouth pain. Sig: Swish/Swallow 5-10 ml four times a day as needed. Dispense 480 ml. 1RF (Patient not taking: Reported on 01/02/2023) 480 mL 1   MULTIPLE VITAMIN PO Take 1 tablet by mouth daily. (Patient not taking: Reported on 11/29/2022)     ondansetron (ZOFRAN) 8 MG tablet Take 1 tablet (8 mg total) by mouth every 8 (eight) hours  as needed for nausea or vomiting. (Patient not taking: Reported on 12/04/2022) 30 tablet 3   No current facility-administered medications for this visit.   Facility-Administered Medications Ordered in Other Visits  Medication Dose Route Frequency Provider Last Rate Last Admin   heparin lock flush 100 UNIT/ML injection            heparin lock flush 100 unit/mL  500 Units Intravenous Once Michaelyn Barter, MD       sodium chloride flush (NS) 0.9 % injection 10 mL  10 mL Intravenous Once Michaelyn Barter, MD        PHYSICAL EXAMINATION: ECOG PERFORMANCE STATUS: 2 - Symptomatic, <50% confined to bed  Vitals:   01/09/23 0844  BP: 134/71  Pulse: 86  Temp: 97.8 F (36.6 C)  SpO2: 100%   Filed Weights   01/09/23 0844  Weight: 136 lb 6.4 oz (61.9 kg)    Physical Exam Constitutional:      Appearance: Normal appearance.  HENT:     Head: Normocephalic and atraumatic.  Cardiovascular:     Rate and Rhythm: Normal rate.  Pulmonary:     Effort: Pulmonary effort is normal.  Abdominal:     General: There is no distension.     Tenderness: There is no abdominal tenderness.  Musculoskeletal:     Right lower leg: No edema.     Left lower leg: No edema.  Skin:    General: Skin is warm.     Findings: Lesion (shingles on back) present.  Neurological:     General: No focal deficit present.     Mental Status: She is oriented to person, place, and time.  Psychiatric:        Mood and Affect: Mood normal.     LABORATORY DATA:  I have reviewed the data as listed Lab Results  Component Value Date   WBC 2.6 (L) 01/09/2023   NEUTROABS 1.5 (L) 01/09/2023   HGB 8.8 (L) 01/09/2023   HCT 27.4 (L) 01/09/2023   MCV 107.9 (H) 01/09/2023   PLT 95 (L) 01/09/2023     Chemistry      Component Value Date/Time   NA 131 (L) 01/02/2023 1311   K 4.1 01/02/2023 1311   CL 105 01/02/2023 1311   CO2 20 (L) 01/02/2023 1311   BUN 16 01/02/2023 1311   CREATININE 1.30 (H) 01/02/2023 1311   CREATININE  1.30 (H) 12/19/2022 0821      Component Value Date/Time   CALCIUM 8.4 (L) 01/02/2023 1311   ALKPHOS 246 (H) 01/02/2023 1311   AST 18 01/02/2023 1311   AST 18 12/19/2022 0821   ALT 14 01/02/2023 1311   ALT 13 12/19/2022 0821   BILITOT 0.4 01/02/2023 1311   BILITOT 0.3 12/19/2022 0821       RADIOGRAPHIC STUDIES: I have personally reviewed the radiological images as listed and agreed with the findings in the report. DG Chest Port 1 View  Result Date: 12/17/2022 CLINICAL DATA:  Status post bronchoscopy EXAM: PORTABLE CHEST 1 VIEW COMPARISON:  11/19/2021 FINDINGS: Heart size is normal. Right chest port catheter. Spiculated mass of the medial left upper lobe. No pneumothorax or acute appearing airspace opacity. Osseous structures unremarkable. IMPRESSION: Spiculated mass of the medial left upper lobe. No pneumothorax or acute appearing airspace opacity. Electronically Signed   By: Jearld Lesch M.D.   On: 12/17/2022 13:10   DG C-ARM BRONCHOSCOPY  Result Date: 12/17/2022 C-ARM BRONCHOSCOPY: Fluoroscopy was utilized by the requesting physician.  No radiographic interpretation.   CT Super D Chest Wo Contrast  Result Date: 12/16/2022 CLINICAL DATA:  Stage IV lung cancer.  * Tracking Code: BO * EXAM: CT CHEST, ABDOMEN AND PELVIS WITHOUT CONTRAST TECHNIQUE: Multidetector CT imaging of the chest, abdomen and pelvis was performed following the standard protocol without IV contrast. RADIATION DOSE REDUCTION: This exam was performed according to the departmental dose-optimization program which includes automated exposure control, adjustment of the mA and/or kV according to patient size and/or use of iterative reconstruction technique. COMPARISON:  09/10/2022. FINDINGS: CT CHEST FINDINGS Cardiovascular: Right IJ Port-A-Cath terminates in the right atrium. Atherosclerotic calcification of the aorta. Heart size normal. No pericardial effusion. Mediastinum/Nodes: No pathologically enlarged mediastinal or  axillary lymph nodes. Hilar regions are difficult to definitively evaluate without IV contrast. Esophagus is grossly unremarkable. Lungs/Pleura: Spiculated left upper lobe masslike consolidation measures approximately 2.6 x 3.0 cm (4/38), overall minimally decreased from 2.5 x 3.4 cm on 09/10/2022. Upper and midlung zone predominant septal thickening and perilymphatic nodularity have improved slightly in the interval as well.  No pleural fluid. Airway is unremarkable. Musculoskeletal: New rounded sclerotic lesion in the T12 vertebral body (4/130). CT ABDOMEN PELVIS FINDINGS Hepatobiliary: Liver is grossly unremarkable. Stones in the gallbladder. No biliary ductal dilatation. Pancreas: Negative. Spleen: Negative. Adrenals/Urinary Tract: Adrenal glands are unremarkable. Low-attenuation lesion in the right kidney. No specific follow-up necessary. Kidneys are otherwise unremarkable. Ureters are decompressed. Possible nodular bladder wall thickening along the right lateral wall (2/64). Stomach/Bowel: Stomach, small bowel, appendix and colon are unremarkable. Vascular/Lymphatic: Atherosclerotic calcification of the aorta. Reproductive: Uterus is visualized. There may be a uterine fibroid. No adnexal mass. Other: No free fluid.  Mesenteries and peritoneum are unremarkable. Musculoskeletal: Intramedullary rod in the left femur. Cortical regularity involving the left third anterior rib. Sclerotic lesion in the proximal right humerus new fluffy sclerosis in the L1, L3 and L4 vertebral bodies as well as new rounded sclerosis in the left iliac wing. Some of these are associated previously seen lytic lesions. Similar pathologic compression of the L3 superior endplate. Grade 1 anterolisthesis of L4 on L5. Similar heterogeneously sclerotic sacral and right iliac wing metastases. IMPRESSION: 1. 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 2. Several osseous metastatic lesions, many of which show evidence of interval healing. 3. Possible nodular wall thickening along the right lateral bladder wall. Difficult to exclude urothelial carcinoma. Please correlate clinically. 4. Cholelithiasis. 5.  Aortic atherosclerosis (ICD10-I70.0). Electronically Signed   By: Leanna Battles M.D.   On: 12/16/2022 13:49   CT ABDOMEN PELVIS WO CONTRAST  Result Date: 12/16/2022 CLINICAL DATA:  Stage IV lung cancer.  * Tracking Code: BO * EXAM: CT CHEST, ABDOMEN AND PELVIS WITHOUT CONTRAST TECHNIQUE: Multidetector CT imaging of the chest, abdomen and pelvis was performed following the standard protocol without IV contrast. RADIATION DOSE REDUCTION: This exam was performed according to the departmental dose-optimization program which includes automated exposure control, adjustment of the mA and/or kV according to patient size and/or use of iterative reconstruction technique. COMPARISON:  09/10/2022. FINDINGS: CT CHEST FINDINGS Cardiovascular: Right IJ Port-A-Cath terminates in the right atrium. Atherosclerotic calcification of the aorta. Heart size normal. No pericardial effusion. Mediastinum/Nodes: No pathologically enlarged mediastinal or axillary lymph nodes. Hilar regions are difficult to definitively evaluate without IV contrast. Esophagus is grossly unremarkable. Lungs/Pleura: Spiculated left upper lobe masslike consolidation measures approximately 2.6 x 3.0 cm (4/38), overall minimally decreased from 2.5 x 3.4 cm on 09/10/2022. Upper and midlung zone predominant septal thickening and perilymphatic nodularity have improved slightly in the interval as well. No pleural fluid. Airway is unremarkable. Musculoskeletal: New rounded sclerotic lesion in the T12 vertebral body (4/130). CT ABDOMEN PELVIS FINDINGS Hepatobiliary: Liver is grossly unremarkable. Stones in the gallbladder. No biliary ductal dilatation. Pancreas: Negative. Spleen: Negative.  Adrenals/Urinary Tract: Adrenal glands are unremarkable. Low-attenuation lesion in the right kidney. No specific follow-up necessary. Kidneys are otherwise unremarkable. Ureters are decompressed. Possible nodular bladder wall thickening along the right lateral wall (2/64). Stomach/Bowel: Stomach, small bowel, appendix and colon are unremarkable. Vascular/Lymphatic: Atherosclerotic calcification of the aorta. Reproductive: Uterus is visualized. There may be a uterine fibroid. No adnexal mass. Other: No free fluid.  Mesenteries and peritoneum are unremarkable. Musculoskeletal: Intramedullary rod in the left femur. Cortical regularity involving the left third anterior rib. Sclerotic lesion in the proximal right humerus new fluffy sclerosis in the L1, L3 and L4 vertebral bodies as well as new rounded sclerosis in the left iliac wing. Some of these are  associated previously seen lytic lesions. Similar pathologic compression of the L3 superior endplate. Grade 1 anterolisthesis of L4 on L5. Similar heterogeneously sclerotic sacral and right iliac wing metastases. IMPRESSION: 1. 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 2. Several osseous metastatic lesions, many of which show evidence of interval healing. 3. Possible nodular wall thickening along the right lateral bladder wall. Difficult to exclude urothelial carcinoma. Please correlate clinically. 4. Cholelithiasis. 5.  Aortic atherosclerosis (ICD10-I70.0). Electronically Signed   By: Leanna Battles M.D.   On: 12/16/2022 13:49    TREATMENT Summary:  Right hip palliative RT 30 cGy completed 12/18/2021 Carboplatin, alimta and Keytruda x 4 cycles completed 02/19/22.  Discontinued maintenance alimta (04-23-22) due to worsening renal dysfunction, transaminitis and cytopenias Keytruda maintenance-discontinued on 09/17/2022 due to disease progression Palliative RT to  left frontal lesion 5 fx completed 09/27/22 Carboplatin (AUC 4) and paclitaxel 150 mg/m2 - started 10/07/2022  ASSESSMENT & PLAN:  # Primary lung adenocarcinoma (HCC), Stage IV, PDL1 1% -Treated with 4 cycles of carboplatin and Alimta then on maintenance Keytruda until May 2024.  Initial Foundation 1 testing showed PD-L1 1%.  No other targetable mutations.  Report below  - discontinued maintenance Alimta on 04/23/2022 due to worsening renal function, hematologic toxicity requiring multiple blood transfusions and elevated LFTs despite dose reductions.   -Restaging CT chest abdomen pelvis without contrast (09/10/2022)- marked interval disease progression in lungs, bone and also brain.  Had second opinion with Dr. Kemper Durie at [facility].  No clinical trials available due to brain mets progression.  Liquid foundation 1 testing did not show any targetable mutations.  Tissue biopsy recommended by [facility] for NGS testing.  Recommendations were discussed with the patient.  Pros and cons was discussed about the yield of bone biopsy with the use of decalcifying agent versus lung biopsy.  Patient was seen by pulmonary and the risks benefits were discussed.  Patient decided to proceed with the procedure and is scheduled for end of August.   -CT scans from August 2024 were discussed with the patient and daughter. Showing decrease in the size of left upper lobe mass from 2.5 to 3.4 cm to 2.6 x 3 cm.  Septal thickening and perilymphatic nodularity has improved.  Multiple osseous metastatic lesions many of which show evidence of interval healing.  The scans were also reviewed in tumor board.  Overall disease is stable to mildly improved.  -Started on second line carbo AUC 4 and Taxol 150 mg/m on 10/07/2022. Currently, s/p cycle 4 of carbo-taxol with neulasta support. Plan is for 6 cycles of carbo-taxol.   - She had interval bronchoscopy and biopsy with Dr. Tonia Brooms. Viable tumor was insufficient which I explained may be result of  scarring, inflammation, and malignancy. No actionable mutations were seen on liquid foundation one, did have high tumor DNA, therefore, no further testing recommended.   - Labs reviewed. Hold treatment due to thrombocytopenia. Will reevaluate next week. Proceed with fluids only today.   - We again discussed adding avastin with today's cycle. Discussed rationale and possible risks. Family and patient in agreement but would like to wait to hear from [facility] before proceeding. Dr Alena Bills has been in touch.   # Secondary metastasis to brain -MRI brain with and without contrast (09/10/2022) showed interval mild progression of left frontal calvarium lesion with extension of tumor to involve subjacent dura measuring 2.8 x 2 cm.  Suspect slight/early extension into  superior left orbit as well as extension of bony edema to involve left sphenoid wing with probable slight involvement of adjacent dura in the region.  New 1 cm enhancing lesion along the posterior left temporal lobe along the falx.  -Completed palliative RT to left frontal lesion 5 fractions on 09/27/2022.  Repeat MRI from 10/25/2022 showed slight increase in the left frontal calvarium to 3.1 cm and thickened lining could be reactive versus progression.  1 cm lesion in the posterior temporal lobe is stable.    -Follows with Dr. Barbaraann Cao.  Repeat MRI brain with and without contrast scheduled for September 13.  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 Cr stabilized.  - will continue to closely monitor.  # Chemotherapy-induced anemia  -Will monitor while on chemotherapy.   -Will proceed with Aranesp 300 mcg with every chemo cycle.  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 Aggie Cosier on 12/18/2021 - Was seen by [facility].  No surgical intervention needed.  # Left hip pain -MRI left hip showing metastatic lesions.  Undergoing radiation.  Continue with tramadol 75 mg every 6 as needed.  # Metastatic bone  lesions -With the stabilization of creatinine, will plan to start her on Xgeva.  Requested for dental clearance.  # Chemotherapy-induced mucositis -Sent prescription for Magic mouthwash.  # Shingles - new since her last visit. She has nearly completed treatment dosing of valtrex and we discussed transitioning to suppressive dosing for duration of chemotherapy. She is in agreement. Prescription for Valtrex sent to pharmacy. Lesions have dried up.   # Weight loss - secondary to malignancy and treatments - Start olanzapine 5 mg at bedtime  - she'll see Joli today as well.   # Nodularity of Bladder Wall - incidental finding on CT. Will refer to Urology.   # Access - port  Disposition:  - fluids today - RTC in 1 week for port/labs, Dr Alena Bills, cycle 5 carbo-taxol-avastin with aranesp  All questions were answered. The patient knows to call the clinic with any problems, questions or concerns.  The total time spent in the appointment was 50 minutes encounter with patients including review of chart and various tests results, discussions about plan of care and coordination of care plan   Alinda Dooms, NP 01/09/2023  CC: Dr Alena Bills

## 2023-01-10 ENCOUNTER — Inpatient Hospital Stay (HOSPITAL_BASED_OUTPATIENT_CLINIC_OR_DEPARTMENT_OTHER): Payer: PPO | Admitting: Internal Medicine

## 2023-01-10 ENCOUNTER — Encounter: Payer: Self-pay | Admitting: Internal Medicine

## 2023-01-10 VITALS — BP 149/79 | HR 93 | Temp 98.7°F | Resp 20 | Wt 140.5 lb

## 2023-01-10 DIAGNOSIS — C7931 Secondary malignant neoplasm of brain: Secondary | ICD-10-CM | POA: Diagnosis not present

## 2023-01-10 DIAGNOSIS — Z5111 Encounter for antineoplastic chemotherapy: Secondary | ICD-10-CM | POA: Diagnosis not present

## 2023-01-10 NOTE — Progress Notes (Signed)
[facility] at [facility] [address]  [place], [place] [address] [phone]   Interval Evaluation  Date of Service: 01/10/23 Patient Name: Victoria Foley Patient MRN: [id number] Patient DOB: 28-Aug-1950 Provider: Henreitta Leber, MD  Identifying Statement:  Victoria Foley is a 72 y.o. female with Metastasis to brain [facility])    Primary Cancer:  Oncologic History: Oncology History  Primary lung adenocarcinoma (HCC)  10/10/2021 Initial Diagnosis   Primary lung adenocarcinoma (HCC) Stage IV  Patient seen by PCP for wheezing for 2 weeks. Imaging with CXR followed by CT chest showed Spiculated 2.1 x 2.2 cm left upper lobe pulmonary nodule with associated pleural tethering. Innumerable subcentimeter pulmonary nodules throughout the lungs.      11/14/2021 PET scan   IMPRESSION: 1. Left suprahilar nodule with maximum SUV 4.5, and a more distal 2.2 cm left upper lobe nodule with maximum SUV of 5.5. 2. Innumerable scattered small pulmonary nodules, most of which are under 5 mm in diameter, throughout both lungs. Some are cavitary. Possibilities include cavitating hematogenous disseminated malignancy versus infectious etiology subset as septic emboli. Most of the nodules are stable, some are minimally enlarged compared to 10/10/2021. 3. Abnormal mild permeative type bony findings in the right hemipelvis associated with substantially accentuated metabolic activity. Possible associated pathologic fracture through the right quadrilateral plate and right acetabulum. The findings involve the right ilium and ischium but also the right lateral sacral ala and a small focus in the left sacrum.    Pathology Results   She had transbronchial biopsies by Dr. Jayme Cloud on 11/19/21.  Pathology from LUL nodule showed adenocarcinoma, consistent with lung primary.    11/19/2021 Cancer Staging   Staging form: Lung, AJCC 8th Edition - Clinical stage from 11/19/2021: Stage  IVB (cT1c, cM1c) - Signed by Michaelyn Barter, MD on 11/27/2021 Stage prefix: Initial diagnosis   11/27/2021 Imaging   MRI pelvis  IMPRESSION: 1. Abnormal marrow signal throughout the right ilium involving the right acetabulum, right superior pubic ramus and right inferior pubic ramus, right side of the sacrum and a smaller focus of abnormal marrow lesion in the left side of the sacrum. Findings are concerning for metastatic disease. 2. Nondisplaced pathologic fracture of the quadrilateral plate of the right acetabulum. 3. Mild muscle edema in the right gluteus minimus and medius muscles likely reflecting mild muscle strain.  MRI brain  IMPRESSION: No evidence of intracranial metastatic disease.   Indeterminate 1 cm left frontal calvarium lesion.   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  Continues to deny neurologic complaints.  Still has orthopedic limitations with her gait from hip pain.  Continues  on carbo+taxol for lung cancer, was infused yesterday without complication.    H+P (11/29/22) Patient presents today to review recent CNS disease and treatments.  She completed 5 sessions  of radiation for progressive left frontal calvarial metastasis in early June.  Denies neurologic symptoms at this time.  Walking is limited by hip pain from cancer.  No cognitive complaints, denies headaches and seizures.  Continues on taxol and carboplatin with Dr. Alena Bills.  Medications: Current Outpatient Medications on File Prior to Visit  Medication Sig Dispense Refill   albuterol (VENTOLIN HFA) 108 (90 Base) MCG/ACT inhaler Inhale 2 puffs into the lungs every 6 (six) hours as needed for wheezing or shortness of breath. (Patient not taking: Reported on 12/19/2022) 18 g 0   Azelastine HCl 137 MCG/SPRAY SOLN Place 2 sprays into both nostrils 2 (two) times daily.     Cholecalciferol (VITAMIN D-3 PO) Take by mouth. (Patient not taking: Reported on 12/04/2022)     dexamethasone (DECADRON) 4 MG tablet Take 1 tablet (4 mg total) by mouth daily. Take for 3 days after chemotherapy (Patient not taking: Reported on 12/04/2022) 30 tablet 0   fluticasone (FLONASE) 50 MCG/ACT nasal spray USE TWO SPRAYS IN EACH NOSTRIL ONCE DAILY prn (Patient not taking: Reported on 11/29/2022) 16 g 12   HYDROcodone-acetaminophen (NORCO/VICODIN) 5-325 MG tablet Take by mouth. (Patient not taking: Reported on 11/25/2022)     loratadine (CLARITIN) 10 MG tablet TAKE ONE TABLET BY MOUTH DAILY AS NEEDED FOR ALLERGIES (Patient not taking: Reported on 11/29/2022) 90 tablet 3   losartan (COZAAR) 100 MG tablet TAKE 1 TABLET BY MOUTH DAILY 30 tablet 0   magic mouthwash w/lidocaine SOLN Take 5 mLs by mouth 4 (four) times daily as needed for mouth pain. Sig: Swish/Swallow 5-10 ml four times a day as needed. Dispense 480 ml. 1RF (Patient not taking: Reported on 01/02/2023) 480 mL 1   MULTIPLE VITAMIN PO Take 1 tablet by mouth daily. (Patient not taking: Reported on 11/29/2022)     nebivolol (BYSTOLIC) 2.5 MG tablet TAKE 1 TABLET BY MOUTH DAILY 90 tablet 0   OLANZapine (ZYPREXA) 5 MG tablet Take 1 tablet (5 mg total) by mouth at bedtime. For appetite 30  tablet 0   ondansetron (ZOFRAN) 8 MG tablet Take 1 tablet (8 mg total) by mouth every 8 (eight) hours as needed for nausea or vomiting. (Patient not taking: Reported on 12/04/2022) 30 tablet 3   traMADol (ULTRAM) 50 MG tablet TAKE 1 TABLET BY MOUTH EVERY 6 HOURS AS NEEDED 60 tablet 0   traMADol HCl 25 MG TABS Take 1 tablet by mouth every 6 (six) hours as needed for up to 90 doses. Please add with your 50 mg tramadol to make it 75 mg every 6 hours as needed for pain. 90 tablet 0   valACYclovir (VALTREX) 1000 MG tablet Take 1 tablet (1,000 mg total) by mouth 3 (three) times daily. 21 tablet 0   [START ON 01/11/2023] valACYclovir (VALTREX) 500 MG tablet Take 1 tablet (500 mg total) by mouth 2 (two) times daily. For shingles prophylaxis 60 tablet 1   Current Facility-Administered Medications on File Prior to Visit  Medication Dose Route Frequency Provider Last Rate Last Admin   heparin lock flush 100 UNIT/ML injection            heparin lock flush 100 unit/mL  500 Units Intravenous Once Michaelyn Barter, MD       sodium chloride flush (NS) 0.9 % injection 10 mL  10 mL  Intravenous Once Michaelyn Barter, MD        Allergies:  Allergies  Allergen Reactions   Diclofenac Hives   Lisinopril     cough   Sulfa Antibiotics     ? 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   COVID-19    03/2021   Hypertension    Pneumonia    age 44   Pre-diabetes    Prediabetes    Sciatica    Past Surgical History:  Past Surgical History:  Procedure Laterality Date   BRONCHIAL BIOPSY  12/17/2022   Procedure: BRONCHIAL BIOPSIES;  Surgeon: Josephine Igo, DO;  Location: [facility];  Service: Pulmonary;;   BRONCHIAL NEEDLE ASPIRATION BIOPSY  12/17/2022   Procedure: BRONCHIAL NEEDLE ASPIRATION BIOPSIES;  Surgeon: Josephine Igo, DO;  Location: [facility];  Service: Pulmonary;;   CESAREAN SECTION     COLONOSCOPY W/ POLYPECTOMY     ECTOPIC PREGNANCY SURGERY     FEMUR SURGERY     left, s/p rod for fracture   IR IMAGING GUIDED PORT INSERTION  12/11/2021   TUBAL LIGATION     Social History:  Social History   Socioeconomic History   Marital status: Widowed    Spouse name: Not on file   Number of children: Not on file   Years of education: Not on file   Highest education level: Not on file  Occupational History   Not on file  Tobacco Use   Smoking status: Never   Smokeless tobacco: Never  Vaping Use   Vaping status: Never Used  Substance and Sexual Activity   Alcohol use: Never   Drug use: Never   Sexual activity: Not Currently  Other Topics Concern   Not on file  Social History Narrative   Lives in [place] but husband died Jun 21, 2020 motorcycle accident bday 10/19/20 married 47 years   No pets   2 daughters and 3 granddaughters      Work - [employer] retired 09/2019   Diet - regular diet   Exercise - none   Social Determinants of Health   Financial Resource Strain: Low Risk  (12/20/2021)   Overall Financial Resource Strain (CARDIA)    Difficulty of Paying Living Expenses: Not hard at all  Food Insecurity: No Food Insecurity (12/20/2021)   Hunger Vital Sign    Worried About Running Out of Food in the Last Year: Never true    Ran Out of Food in the Last Year: Never true  Transportation Needs: No Transportation Needs (12/20/2021)   PRAPARE - Administrator, Civil Service (Medical): No    Lack of Transportation (Non-Medical): No  Physical Activity: Not on file  Stress: No Stress Concern Present (12/20/2021)   [employer] of Occupational Health - Occupational Stress Questionnaire    Feeling of Stress : Not at all  Social Connections: Unknown (12/19/2020)   Social Connection and Isolation Panel [NHANES]    Frequency of Communication with Friends and Family: More than  three times a week    Frequency of Social Gatherings with Friends and Family: Not on file    Attends Religious Services: Not on file    Active Member of Clubs or Organizations: Not on file  Attends Banker Meetings: Not on file    Marital Status: Not on file  Intimate Partner Violence: Not At Risk (12/20/2021)   Humiliation, Afraid, Rape, and Kick questionnaire    Fear of Current or Ex-Partner: No    Emotionally Abused: No    Physically Abused: No    Sexually Abused: No   Family History:  Family History  Problem Relation Age of Onset   Diabetes Mother    Hypertension Mother    Heart disease Mother    Cancer Mother        breast and stomach   Breast cancer Mother 75   Diabetes Father    Hypertension Father    Heart disease Father    Kidney disease Father     Review of Systems: Constitutional: Doesn't report fevers, chills or abnormal weight loss Eyes: Doesn't report blurriness of vision Ears, nose, mouth, throat, and face: Doesn't report sore throat Respiratory: Doesn't report cough, dyspnea or wheezes Cardiovascular: Doesn't report palpitation, chest discomfort  Gastrointestinal:  Doesn't report nausea, constipation, diarrhea GU: Doesn't report incontinence Skin: Doesn't report skin rashes Neurological: Per HPI Musculoskeletal: Doesn't report joint pain Behavioral/Psych: Doesn't report anxiety  Physical Exam: Vitals:   01/10/23 0924  BP: (!) 149/79  Pulse: 93  Resp: 20  Temp: 98.7 F (37.1 C)  SpO2: 100%    KPS: 90. General: Alert, cooperative, pleasant, in no acute distress Head: Normal EENT: No conjunctival injection or scleral icterus.  Lungs: Resp effort normal Cardiac: Regular rate Abdomen: Non-distended abdomen Skin: No rashes cyanosis or petechiae. Extremities: No clubbing or edema  Neurologic Exam: Mental Status: Awake, alert, attentive to examiner. Oriented to self and environment. Language is fluent with intact comprehension.   Cranial Nerves: Visual acuity is grossly normal. Visual fields are full. Extra-ocular movements intact. No ptosis. Face is symmetric Motor: Tone and bulk are normal. Power is full in both arms and legs. Reflexes are symmetric, no pathologic reflexes present.  Sensory: Intact to light touch Gait: Normal.   Labs: I have reviewed the data as listed    Component Value Date/Time   NA 136 01/09/2023 0828   K 4.0 01/09/2023 0828   CL 109 01/09/2023 0828   CO2 19 (L) 01/09/2023 0828   GLUCOSE 99 01/09/2023 0828   BUN 21 01/09/2023 0828   CREATININE 1.29 (H) 01/09/2023 0828   CALCIUM 8.9 01/09/2023 0828   PROT 6.7 01/09/2023 0828   ALBUMIN 3.3 (L) 01/09/2023 0828   AST 19 01/09/2023 0828   ALT 13 01/09/2023 0828   ALKPHOS 214 (H) 01/09/2023 0828   BILITOT 0.4 01/09/2023 0828   GFRNONAA 44 (L) 01/09/2023 0828   Lab Results  Component Value Date   WBC 2.6 (L) 01/09/2023   NEUTROABS 1.5 (L) 01/09/2023   HGB 8.8 (L) 01/09/2023   HCT 27.4 (L) 01/09/2023   MCV 107.9 (H) 01/09/2023   PLT 95 (L) 01/09/2023    Imaging:  [facility] Clinician Interpretation: I have personally reviewed the CNS images as listed.  My interpretation, in the context of the patient's clinical presentation, is stable disease pending official read  [facility] Chest Port 1 View  Result Date: 12/17/2022 CLINICAL DATA:  Status post bronchoscopy EXAM: PORTABLE CHEST 1 VIEW COMPARISON:  11/19/2021 FINDINGS: Heart size is normal. Right chest port catheter. Spiculated mass of the medial left upper lobe. No pneumothorax or acute appearing airspace opacity. Osseous structures unremarkable. IMPRESSION: Spiculated mass of the medial left upper lobe. No pneumothorax or acute appearing airspace  opacity. Electronically Signed   By: Jearld Lesch M.D.   On: 12/17/2022 13:10   DG C-ARM BRONCHOSCOPY  Result Date: 12/17/2022 C-ARM BRONCHOSCOPY: Fluoroscopy was utilized by the requesting physician.  No radiographic interpretation.      Assessment/Plan Metastasis to brain [facility])  HAMSINI NICOLE is clinically stable today.  MRI brain demonstrates stable findings overall, no recurrence of leptomeningeal enhancement seen on prior study.  Official read is still pending.  There is an additional stable left temporal metastasis that remains untreated.    Recommended continuing imaging surveillance only at this time.  We spent twenty additional minutes teaching regarding the natural history, biology, and historical experience in the treatment of neurologic complications of cancer.   We appreciate the opportunity to participate in the care of JUSTINA DAFFRON.   Will con't to follow with Dr. Alena Bills for systemic therapy.  We ask that UNBORN CARREON return to clinic in 4 months following next brain MRI, or sooner as needed.  All questions were answered. The patient knows to call the clinic with any problems, questions or concerns. No barriers to learning were detected.  The total time spent in the encounter was 40 minutes and more than 50% was on counseling and review of test results   Henreitta Leber, MD Medical Director of Neuro-Oncology [facility] 01/10/23 9:23 AM

## 2023-01-11 ENCOUNTER — Other Ambulatory Visit: Payer: Self-pay

## 2023-01-13 ENCOUNTER — Encounter: Payer: Self-pay | Admitting: Internal Medicine

## 2023-01-13 NOTE — Progress Notes (Signed)
Pathology discussed at oncology office visit.   Thanks,  [facility]  Josephine Igo, DO [facility] 01/13/2023 3:41 PM

## 2023-01-16 MED FILL — Dexamethasone Sodium Phosphate Inj 100 MG/10ML: INTRAMUSCULAR | Qty: 1 | Status: AC

## 2023-01-16 MED FILL — Fosaprepitant Dimeglumine For IV Infusion 150 MG (Base Eq): INTRAVENOUS | Qty: 5 | Status: AC

## 2023-01-17 ENCOUNTER — Encounter: Payer: Self-pay | Admitting: Internal Medicine

## 2023-01-17 ENCOUNTER — Inpatient Hospital Stay: Payer: PPO

## 2023-01-17 ENCOUNTER — Inpatient Hospital Stay: Payer: PPO | Admitting: Internal Medicine

## 2023-01-17 ENCOUNTER — Other Ambulatory Visit: Payer: Self-pay | Admitting: Internal Medicine

## 2023-01-17 DIAGNOSIS — C3490 Malignant neoplasm of unspecified part of unspecified bronchus or lung: Secondary | ICD-10-CM

## 2023-01-20 ENCOUNTER — Encounter: Payer: Self-pay | Admitting: Internal Medicine

## 2023-01-20 MED FILL — Fosaprepitant Dimeglumine For IV Infusion 150 MG (Base Eq): INTRAVENOUS | Qty: 5 | Status: AC

## 2023-01-20 MED FILL — Dexamethasone Sodium Phosphate Inj 100 MG/10ML: INTRAMUSCULAR | Qty: 1 | Status: AC

## 2023-01-21 ENCOUNTER — Inpatient Hospital Stay: Payer: PPO

## 2023-01-21 ENCOUNTER — Inpatient Hospital Stay (HOSPITAL_BASED_OUTPATIENT_CLINIC_OR_DEPARTMENT_OTHER): Payer: PPO | Admitting: Internal Medicine

## 2023-01-21 ENCOUNTER — Inpatient Hospital Stay: Payer: PPO | Attending: Internal Medicine

## 2023-01-21 ENCOUNTER — Encounter: Payer: Self-pay | Admitting: Internal Medicine

## 2023-01-21 ENCOUNTER — Telehealth: Payer: Self-pay

## 2023-01-21 VITALS — BP 121/76 | HR 96 | Temp 98.1°F | Wt 136.0 lb

## 2023-01-21 DIAGNOSIS — C3412 Malignant neoplasm of upper lobe, left bronchus or lung: Secondary | ICD-10-CM | POA: Diagnosis not present

## 2023-01-21 DIAGNOSIS — C3490 Malignant neoplasm of unspecified part of unspecified bronchus or lung: Secondary | ICD-10-CM

## 2023-01-21 DIAGNOSIS — D6959 Other secondary thrombocytopenia: Secondary | ICD-10-CM

## 2023-01-21 DIAGNOSIS — C7931 Secondary malignant neoplasm of brain: Secondary | ICD-10-CM | POA: Insufficient documentation

## 2023-01-21 DIAGNOSIS — G620 Drug-induced polyneuropathy: Secondary | ICD-10-CM | POA: Diagnosis not present

## 2023-01-21 DIAGNOSIS — C7951 Secondary malignant neoplasm of bone: Secondary | ICD-10-CM | POA: Insufficient documentation

## 2023-01-21 DIAGNOSIS — Z79899 Other long term (current) drug therapy: Secondary | ICD-10-CM | POA: Diagnosis not present

## 2023-01-21 DIAGNOSIS — Z5111 Encounter for antineoplastic chemotherapy: Secondary | ICD-10-CM

## 2023-01-21 DIAGNOSIS — N189 Chronic kidney disease, unspecified: Secondary | ICD-10-CM | POA: Insufficient documentation

## 2023-01-21 DIAGNOSIS — D6481 Anemia due to antineoplastic chemotherapy: Secondary | ICD-10-CM

## 2023-01-21 DIAGNOSIS — T451X5A Adverse effect of antineoplastic and immunosuppressive drugs, initial encounter: Secondary | ICD-10-CM

## 2023-01-21 LAB — CMP (CANCER CENTER ONLY)
ALT: 14 U/L (ref 0–44)
AST: 23 U/L (ref 15–41)
Albumin: 3.6 g/dL (ref 3.5–5.0)
Alkaline Phosphatase: 212 U/L — ABNORMAL HIGH (ref 38–126)
Anion gap: 8 (ref 5–15)
BUN: 28 mg/dL — ABNORMAL HIGH (ref 8–23)
CO2: 22 mmol/L (ref 22–32)
Calcium: 9.1 mg/dL (ref 8.9–10.3)
Chloride: 106 mmol/L (ref 98–111)
Creatinine: 1.53 mg/dL — ABNORMAL HIGH (ref 0.44–1.00)
GFR, Estimated: 36 mL/min — ABNORMAL LOW (ref >60)
Glucose, Bld: 103 mg/dL — ABNORMAL HIGH (ref 70–99)
Potassium: 3.8 mmol/L (ref 3.5–5.1)
Sodium: 136 mmol/L (ref 135–145)
Total Bilirubin: 0.6 mg/dL (ref 0.3–1.2)
Total Protein: 7 g/dL (ref 6.5–8.1)

## 2023-01-21 LAB — CBC WITH DIFFERENTIAL (CANCER CENTER ONLY)
Abs Immature Granulocytes: 0.01 10*3/uL (ref 0.00–0.07)
Basophils Absolute: 0 10*3/uL (ref 0.0–0.1)
Basophils Relative: 1 %
Eosinophils Absolute: 0.1 10*3/uL (ref 0.0–0.5)
Eosinophils Relative: 3 %
HCT: 29.6 % — ABNORMAL LOW (ref 36.0–46.0)
Hemoglobin: 9.8 g/dL — ABNORMAL LOW (ref 12.0–15.0)
Immature Granulocytes: 0 %
Lymphocytes Relative: 30 %
Lymphs Abs: 0.8 10*3/uL (ref 0.7–4.0)
MCH: 35.3 pg — ABNORMAL HIGH (ref 26.0–34.0)
MCHC: 33.1 g/dL (ref 30.0–36.0)
MCV: 106.5 fL — ABNORMAL HIGH (ref 80.0–100.0)
Monocytes Absolute: 0.3 10*3/uL (ref 0.1–1.0)
Monocytes Relative: 12 %
Neutro Abs: 1.5 10*3/uL — ABNORMAL LOW (ref 1.7–7.7)
Neutrophils Relative %: 54 %
Platelet Count: 100 10*3/uL — ABNORMAL LOW (ref 150–400)
RBC: 2.78 MIL/uL — ABNORMAL LOW (ref 3.87–5.11)
RDW: 15.1 % (ref 11.5–15.5)
WBC Count: 2.8 10*3/uL — ABNORMAL LOW (ref 4.0–10.5)
nRBC: 0 % (ref 0.0–0.2)

## 2023-01-21 MED ORDER — HEPARIN SOD (PORK) LOCK FLUSH 100 UNIT/ML IV SOLN
500.0000 [IU] | Freq: Once | INTRAVENOUS | Status: AC | PRN
  Administered 2023-01-21: 500 [IU]
  Filled 2023-01-21: qty 5

## 2023-01-21 MED ORDER — SODIUM CHLORIDE 0.9 % IV SOLN
Freq: Once | INTRAVENOUS | Status: AC
  Filled 2023-01-21: qty 250

## 2023-01-21 MED ORDER — SODIUM CHLORIDE 0.9 % IV SOLN
150.0000 mg | Freq: Once | INTRAVENOUS | Status: AC
  Administered 2023-01-21: 150 mg via INTRAVENOUS
  Filled 2023-01-21 (×2): qty 5
  Filled 2023-01-21: qty 150

## 2023-01-21 MED ORDER — PALONOSETRON HCL INJECTION 0.25 MG/5ML
0.2500 mg | Freq: Once | INTRAVENOUS | Status: AC
  Administered 2023-01-21: 0.25 mg via INTRAVENOUS
  Filled 2023-01-21: qty 5

## 2023-01-21 MED ORDER — FAMOTIDINE IN NACL 20-0.9 MG/50ML-% IV SOLN
20.0000 mg | Freq: Once | INTRAVENOUS | Status: AC
  Administered 2023-01-21: 20 mg via INTRAVENOUS
  Filled 2023-01-21: qty 50

## 2023-01-21 MED ORDER — DIPHENHYDRAMINE HCL 50 MG/ML IJ SOLN
50.0000 mg | Freq: Once | INTRAMUSCULAR | Status: AC
  Administered 2023-01-21: 50 mg via INTRAVENOUS
  Filled 2023-01-21: qty 1

## 2023-01-21 MED ORDER — SODIUM CHLORIDE 0.9% FLUSH
10.0000 mL | Freq: Once | INTRAVENOUS | Status: AC
  Administered 2023-01-21: 10 mL via INTRAVENOUS
  Filled 2023-01-21: qty 10

## 2023-01-21 MED ORDER — SODIUM CHLORIDE 0.9 % IV SOLN
237.2000 mg | Freq: Once | INTRAVENOUS | Status: AC
  Administered 2023-01-21: 240 mg via INTRAVENOUS
  Filled 2023-01-21: qty 24

## 2023-01-21 MED ORDER — SODIUM CHLORIDE 0.9 % IV SOLN
150.0000 mg/m2 | Freq: Once | INTRAVENOUS | Status: AC
  Administered 2023-01-21: 246 mg via INTRAVENOUS
  Filled 2023-01-21: qty 41

## 2023-01-21 MED ORDER — SODIUM CHLORIDE 0.9 % IV SOLN
10.0000 mg | Freq: Once | INTRAVENOUS | Status: AC
  Administered 2023-01-21: 10 mg via INTRAVENOUS
  Filled 2023-01-21 (×2): qty 1
  Filled 2023-01-21: qty 10

## 2023-01-21 NOTE — Progress Notes (Signed)
Patient has shingles right now so that is causing her some pain. Other than that her appetite is really the only thing that is not doing well.

## 2023-01-21 NOTE — Patient Instructions (Signed)
[facility]  Discharge Instructions: Thank you for choosing [facility] to provide your oncology and hematology care.  If you have a lab appointment with the Cancer Center, please go directly to the Cancer Center and check in at the registration area.  Wear comfortable clothing and clothing appropriate for easy access to any Portacath or PICC line.   We strive to give you quality time with your provider. You may need to reschedule your appointment if you arrive late (15 or more minutes).  Arriving late affects you and other patients whose appointments are after yours.  Also, if you miss three or more appointments without notifying the office, you may be dismissed from the clinic at the provider's discretion.      For prescription refill requests, have your pharmacy contact our office and allow 72 hours for refills to be completed.    Today you received the following chemotherapy and/or immunotherapy agents TAXOL and CARBOPLATIN      To help prevent nausea and vomiting after your treatment, we encourage you to take your nausea medication as directed.  BELOW ARE SYMPTOMS THAT SHOULD BE REPORTED IMMEDIATELY: *FEVER GREATER THAN 100.4 F (38 C) OR HIGHER *CHILLS OR SWEATING *NAUSEA AND VOMITING THAT IS NOT CONTROLLED WITH YOUR NAUSEA MEDICATION *UNUSUAL SHORTNESS OF BREATH *UNUSUAL BRUISING OR BLEEDING *URINARY PROBLEMS (pain or burning when urinating, or frequent urination) *BOWEL PROBLEMS (unusual diarrhea, constipation, pain near the anus) TENDERNESS IN MOUTH AND THROAT WITH OR WITHOUT PRESENCE OF ULCERS (sore throat, sores in mouth, or a toothache) UNUSUAL RASH, SWELLING OR PAIN  UNUSUAL VAGINAL DISCHARGE OR ITCHING   Items with * indicate a potential emergency and should be followed up as soon as possible or go to the Emergency Department if any problems should occur.  Please show the CHEMOTHERAPY ALERT CARD or IMMUNOTHERAPY ALERT CARD at  check-in to the Emergency Department and triage nurse.  Should you have questions after your visit or need to cancel or reschedule your appointment, please contact [facility]  [phone] and follow the prompts.  Office hours are 8:00 a.m. to 4:30 p.m. Monday - Friday. Please note that voicemails left after 4:00 p.m. may not be returned until the following business day.  We are closed weekends and major holidays. You have access to a nurse at all times for urgent questions. Please call the main number to the clinic [phone] and follow the prompts.  For any non-urgent questions, you may also contact your provider using MyChart. We now offer e-Visits for anyone 54 and older to request care online for non-urgent symptoms. For details visit [email or web address].   Also download the MyChart app! Go to the app store, search "MyChart", open the app, select [facility], and log in with your MyChart username and password.   Paclitaxel Injection What is this medication? PACLITAXEL (PAK li TAX el) treats some types of cancer. It works by slowing down the growth of cancer cells. This medicine may be used for other purposes; ask your health care provider or pharmacist if you have questions. COMMON BRAND NAME(S): Onxol, Taxol What should I tell my care team before I take this medication? They need to know if you have any of these conditions: Heart disease Liver disease Low white blood cell levels An unusual or allergic reaction to paclitaxel, other medications, foods, dyes, or preservatives If you or your partner are pregnant or trying to get pregnant Breast-feeding How should I  use this medication? This medication is injected into a vein. It is given by your care team in a hospital or clinic setting. Talk to your care team about the use of this medication in children. While it may be given to children for selected conditions, precautions do apply. Overdosage: If  you think you have taken too much of this medicine contact a poison control center or emergency room at once. NOTE: This medicine is only for you. Do not share this medicine with others. What if I miss a dose? Keep appointments for follow-up doses. It is important not to miss your dose. Call your care team if you are unable to keep an appointment. What may interact with this medication? Do not take this medication with any of the following: Live virus vaccines Other medications may affect the way this medication works. Talk with your care team about all of the medications you take. They may suggest changes to your treatment plan to lower the risk of side effects and to make sure your medications work as intended. This list may not describe all possible interactions. Give your health care provider a list of all the medicines, herbs, non-prescription drugs, or dietary supplements you use. Also tell them if you smoke, drink alcohol, or use illegal drugs. Some items may interact with your medicine. What should I watch for while using this medication? Your condition will be monitored carefully while you are receiving this medication. You may need blood work while taking this medication. This medication may make you feel generally unwell. This is not uncommon as chemotherapy can affect healthy cells as well as cancer cells. Report any side effects. Continue your course of treatment even though you feel ill unless your care team tells you to stop. This medication can cause serious allergic reactions. To reduce the risk, your care team may give you other medications to take before receiving this one. Be sure to follow the directions from your care team. This medication may increase your risk of getting an infection. Call your care team for advice if you get a fever, chills, sore throat, or other symptoms of a cold or flu. Do not treat yourself. Try to avoid being around people who are sick. This medication may  increase your risk to bruise or bleed. Call your care team if you notice any unusual bleeding. Be careful brushing or flossing your teeth or using a toothpick because you may get an infection or bleed more easily. If you have any dental work done, tell your dentist you are receiving this medication. Talk to your care team if you may be pregnant. Serious birth defects can occur if you take this medication during pregnancy. Talk to your care team before breastfeeding. Changes to your treatment plan may be needed. What side effects may I notice from receiving this medication? Side effects that you should report to your care team as soon as possible: Allergic reactions--skin rash, itching, hives, swelling of the face, lips, tongue, or throat Heart rhythm changes--fast or irregular heartbeat, dizziness, feeling faint or lightheaded, chest pain, trouble breathing Increase in blood pressure Infection--fever, chills, cough, sore throat, wounds that don't heal, pain or trouble when passing urine, general feeling of discomfort or being unwell Low blood pressure--dizziness, feeling faint or lightheaded, blurry vision Low red blood cell level--unusual weakness or fatigue, dizziness, headache, trouble breathing Painful swelling, warmth, or redness of the skin, blisters or sores at the infusion site Pain, tingling, or numbness in the hands or feet Slow  heartbeat--dizziness, feeling faint or lightheaded, confusion, trouble breathing, unusual weakness or fatigue Unusual bruising or bleeding Side effects that usually do not require medical attention (report to your care team if they continue or are bothersome): Diarrhea Hair loss Joint pain Loss of appetite Muscle pain Nausea Vomiting This list may not describe all possible side effects. Call your doctor for medical advice about side effects. You may report side effects to FDA at 1-800-FDA-1088. Where should I keep my medication? This medication is given in  a hospital or clinic. It will not be stored at home. NOTE: This sheet is a summary. It may not cover all possible information. If you have questions about this medicine, talk to your doctor, pharmacist, or health care provider.  2024 Elsevier/Gold Standard (2021-08-28 00:00:00)  Carboplatin Injection What is this medication? CARBOPLATIN (KAR boe pla tin) treats some types of cancer. It works by slowing down the growth of cancer cells. This medicine may be used for other purposes; ask your health care provider or pharmacist if you have questions. COMMON BRAND NAME(S): Paraplatin What should I tell my care team before I take this medication? They need to know if you have any of these conditions: Blood disorders Hearing problems Kidney disease Recent or ongoing radiation therapy An unusual or allergic reaction to carboplatin, cisplatin, other medications, foods, dyes, or preservatives Pregnant or trying to get pregnant Breast-feeding How should I use this medication? This medication is injected into a vein. It is given by your care team in a hospital or clinic setting. Talk to your care team about the use of this medication in children. Special care may be needed. Overdosage: If you think you have taken too much of this medicine contact a poison control center or emergency room at once. NOTE: This medicine is only for you. Do not share this medicine with others. What if I miss a dose? Keep appointments for follow-up doses. It is important not to miss your dose. Call your care team if you are unable to keep an appointment. What may interact with this medication? Medications for seizures Some antibiotics, such as amikacin, gentamicin, neomycin, streptomycin, tobramycin Vaccines This list may not describe all possible interactions. Give your health care provider a list of all the medicines, herbs, non-prescription drugs, or dietary supplements you use. Also tell them if you smoke, drink  alcohol, or use illegal drugs. Some items may interact with your medicine. What should I watch for while using this medication? Your condition will be monitored carefully while you are receiving this medication. You may need blood work while taking this medication. This medication may make you feel generally unwell. This is not uncommon, as chemotherapy can affect healthy cells as well as cancer cells. Report any side effects. Continue your course of treatment even though you feel ill unless your care team tells you to stop. In some cases, you may be given additional medications to help with side effects. Follow all directions for their use. This medication may increase your risk of getting an infection. Call your care team for advice if you get a fever, chills, sore throat, or other symptoms of a cold or flu. Do not treat yourself. Try to avoid being around people who are sick. Avoid taking medications that contain aspirin, acetaminophen, ibuprofen, naproxen, or ketoprofen unless instructed by your care team. These medications may hide a fever. Be careful brushing or flossing your teeth or using a toothpick because you may get an infection or bleed more easily. If  you have any dental work done, tell your dentist you are receiving this medication. Talk to your care team if you wish to become pregnant or think you might be pregnant. This medication can cause serious birth defects. Talk to your care team about effective forms of contraception. Do not breast-feed while taking this medication. What side effects may I notice from receiving this medication? Side effects that you should report to your care team as soon as possible: Allergic reactions--skin rash, itching, hives, swelling of the face, lips, tongue, or throat Infection--fever, chills, cough, sore throat, wounds that don't heal, pain or trouble when passing urine, general feeling of discomfort or being unwell Low red blood cell level--unusual  weakness or fatigue, dizziness, headache, trouble breathing Pain, tingling, or numbness in the hands or feet, muscle weakness, change in vision, confusion or trouble speaking, loss of balance or coordination, trouble walking, seizures Unusual bruising or bleeding Side effects that usually do not require medical attention (report to your care team if they continue or are bothersome): Hair loss Nausea Unusual weakness or fatigue Vomiting This list may not describe all possible side effects. Call your doctor for medical advice about side effects. You may report side effects to FDA at 1-800-FDA-1088. Where should I keep my medication? This medication is given in a hospital or clinic. It will not be stored at home. NOTE: This sheet is a summary. It may not cover all possible information. If you have questions about this medicine, talk to your doctor, pharmacist, or health care provider.  2024 Elsevier/Gold Standard (2021-07-31 00:00:00)

## 2023-01-21 NOTE — Progress Notes (Signed)
[facility] OFFICE PROGRESS NOTE  Patient Care Team: Dana Allan, MD as PCP - General (Family Medicine) Glory Buff, RN as Oncology Nurse Navigator Michaelyn Barter, MD as Consulting Physician (Oncology)  TREATMENT:  Right hip palliative RT 30 cGy completed 12/18/2021 Carboplatin, alimta and Keytruda x 4 cycles completed 02/19/22.  Discontinued maintenance alimta (04-23-22) due to worsening renal dysfunction, transaminitis and cytopenias Keytruda maintenance-discontinued on 09/17/2022 due to disease progression Palliative RT to left frontal lesion 5 fx completed 09/27/22 Carboplatin (AUC 4) and paclitaxel 150 mg/m2 - started 10/07/2022   ASSESSMENT & PLAN:  # Primary lung adenocarcinoma (HCC), Stage IV, PDL1 1% -Progressed through first-line [place] Alimta, Keytruda and then [place] maintenance on 09/17/2022.  -Started on second line carbo AUC 4 and Taxol 150 mg/m on 10/07/2022.  CT scan after 3 cycles showed stable to mildly improved disease.  Mentioned about bladder wall thickening, unclear.  Scheduled to see Dr. Richardo Hanks next week.  -Labs reviewed.  Platelet 100, ANC 1.5, hemoglobin 9.8.  Creatinine 1.5.  Will proceed with cycle 5 of CarboTaxol.  Plan to repeat CT imaging after total 6 cycles.  Patient is experiencing neuropathy in feet and fingers.  Mild.  Will continue to monitor.  Discussed about addition of Avastin and she wanted to hold off until reviewed by [facility].  I was able to speak with Dr. Kemper Durie today and he was okay with either proceeding with Avastin understanding that the benefit will be modest versus treatment break.  I will discuss this information with the patient next visit.  -Liquid foundation 1 CDX from June 2024 with no targetable mutation.  Repeat lung biopsy on 12/17/2022 sent for foundation 1 CDX testing was limited due to inadequate sample but no targets seen.  Will request for foundation RNA fusion panel on her baseline lung biopsy.  # Secondary metastasis  to brain -MRI brain with and without contrast (09/10/2022) showed interval mild progression of left frontal calvarium lesion with extension of tumor to involve subjacent dura measuring 2.8 x 2 cm.  Suspect slight/early extension into superior left orbit as well as extension of bony edema to involve left sphenoid wing with probable slight involvement of adjacent dura in the region.  New 1 cm enhancing lesion along the posterior left temporal lobe along the falx.  -Completed palliative RT to left frontal lesion 5 fractions on 09/27/2022.  -Follow-up with Dr. Barbaraann Cao.  Repeat brain MRI from 01/03/2023 stable disease.  Final read pending.  Plan for repeat MRI in 4 months.  # Chemotherapy induced thrombocytopenia -Continue to monitor  # Chemotherapy induced peripheral neuropathy -Grade 1.  Will continue to monitor  #CKD -secondary to Alimta. Cr stabilized.  -will continue to monitor.  # Chemotherapy-induced anemia  -Continue with Aranesp 300 mcg as needed to maintain hemoglobin between 9-10.  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 Aggie Cosier on 12/18/2021 -Completed palliative RT to left hip on 01/01/2023.  Pain has improved.  Taking tramadol 50 mg twice a day. -Was seen by [facility].  No surgical intervention needed.  # Metastatic bone lesions -With the stabilization of creatinine, will plan to start her on Xgeva.  Requested for dental clearance.  # Access - port  RTC in 1 week for labs and fluid RTC in 2 weeks for fluids RTC in 3 weeks for MD visit, labs, cycle 6 of CarboTaxol.  Orders Placed This Encounter  Procedures   CT CHEST ABDOMEN PELVIS WO CONTRAST  Standing Status:   Future    Standing Expiration Date:   01/21/2024    Order Specific Question:   Preferred imaging location?    Answer:   [facility]    Order Specific Question:   If indicated for the ordered procedure, I authorize the administration of oral contrast  media per Radiology protocol    Answer:   Yes    Order Specific Question:   Does the patient have a contrast media/X-ray dye allergy?    Answer:   No   CBC with Differential (Cancer Center Only)    Standing Status:   Future    Standing Expiration Date:   02/13/2024   CMP (Cancer Center only)    Standing Status:   Future    Standing Expiration Date:   02/13/2024   CBC with Differential/Platelet    Standing Status:   Standing    Number of Occurrences:   2    Standing Expiration Date:   01/21/2024   CMP (Cancer Center only)    Standing Status:   Standing    Number of Occurrences:   2    Standing Expiration Date:   01/21/2024    [facility]- 12/06/2021   All questions were answered. The patient knows to call the clinic with any problems, questions or concerns. The total time spent in the appointment was 30 minutes encounter with patients including review of chart and various tests results, discussions about plan of care and coordination of care plan   Michaelyn Barter, MD 01/21/2023 2:50 PM  INTERVAL HISTORY: Please see below for problem oriented charting.  Patient following with [place] for treatment of stage IV lung adenocarcinoma.   Patient was seen today accompanied with daughter prior to cycle 5 of [place] and Taxol. Overall tolerating treatment well.  Reports mild neuropathy in bilateral feet and finger.  Appetite is fair.  Left hip pain has considerably improved after palliative RT.  Taking tramadol twice a day.  REVIEW OF SYSTEMS:   Positive ROS as above.  Rest 10 points ROS negative   I have reviewed the past medical history, past surgical history, social history and family history with the patient and they are unchanged from previous note.  ALLERGIES:  is allergic to diclofenac, lisinopril, and sulfa antibiotics.  MEDICATIONS:  Current Outpatient Medications  Medication Sig Dispense Refill   Azelastine HCl 137 MCG/SPRAY SOLN Place 2 sprays into both nostrils 2 (two) times  daily.     losartan (COZAAR) 100 MG tablet TAKE 1 TABLET BY MOUTH DAILY 30 tablet 0   nebivolol (BYSTOLIC) 2.5 MG tablet TAKE 1 TABLET BY MOUTH DAILY 90 tablet 0   OLANZapine (ZYPREXA) 5 MG tablet Take 1 tablet (5 mg total) by mouth at bedtime. For appetite 30 tablet 0   traMADol (ULTRAM) 50 MG tablet TAKE 1 TABLET BY MOUTH EVERY 6 HOURS AS NEEDED 60 tablet 0   traMADol HCl 25 MG TABS Take 1 tablet by mouth every 6 (six) hours as needed for up to 90 doses. Please add with your 50 mg tramadol to make it 75 mg every 6 hours as needed for pain. 90 tablet 0   valACYclovir (VALTREX) 500 MG tablet Take 1 tablet (500 mg total) by mouth 2 (two) times daily. For shingles prophylaxis 60 tablet 1   albuterol (VENTOLIN HFA) 108 (90 Base) MCG/ACT inhaler Inhale 2 puffs into the lungs every 6 (six) hours as needed for wheezing or shortness of breath. (Patient not taking: Reported on 12/19/2022)  18 g 0   Cholecalciferol (VITAMIN D-3 PO) Take by mouth. (Patient not taking: Reported on 12/04/2022)     dexamethasone (DECADRON) 4 MG tablet Take 1 tablet (4 mg total) by mouth daily. Take for 3 days after chemotherapy (Patient not taking: Reported on 12/04/2022) 30 tablet 0   fluticasone (FLONASE) 50 MCG/ACT nasal spray USE TWO SPRAYS IN EACH NOSTRIL ONCE DAILY prn (Patient not taking: Reported on 11/29/2022) 16 g 12   HYDROcodone-acetaminophen (NORCO/VICODIN) 5-325 MG tablet Take by mouth. (Patient not taking: Reported on 11/25/2022)     loratadine (CLARITIN) 10 MG tablet TAKE ONE TABLET BY MOUTH DAILY AS NEEDED FOR ALLERGIES (Patient not taking: Reported on 11/29/2022) 90 tablet 3   magic mouthwash w/lidocaine SOLN Take 5 mLs by mouth 4 (four) times daily as needed for mouth pain. Sig: Swish/Swallow 5-10 ml four times a day as needed. Dispense 480 ml. 1RF (Patient not taking: Reported on 01/02/2023) 480 mL 1   MULTIPLE VITAMIN PO Take 1 tablet by mouth daily. (Patient not taking: Reported on 11/29/2022)     ondansetron (ZOFRAN) 8  MG tablet Take 1 tablet (8 mg total) by mouth every 8 (eight) hours as needed for nausea or vomiting. (Patient not taking: Reported on 12/04/2022) 30 tablet 3   valACYclovir (VALTREX) 1000 MG tablet Take 1 tablet (1,000 mg total) by mouth 3 (three) times daily. (Patient not taking: Reported on 01/21/2023) 21 tablet 0   No current facility-administered medications for this visit.   Facility-Administered Medications Ordered in Other Visits  Medication Dose Route Frequency Provider Last Rate Last Admin   CARBOplatin (PARAPLATIN) 240 mg in sodium chloride 0.9 % 100 mL chemo infusion  240 mg Intravenous Once Michaelyn Barter, MD 248 mL/hr at 01/21/23 1435 240 mg at 01/21/23 1435   heparin lock flush 100 UNIT/ML injection            heparin lock flush 100 unit/mL  500 Units Intravenous Once Michaelyn Barter, MD       heparin lock flush 100 unit/mL  500 Units Intracatheter Once PRN Michaelyn Barter, MD       sodium chloride flush (NS) 0.9 % injection 10 mL  10 mL Intravenous Once Michaelyn Barter, MD        SUMMARY OF ONCOLOGIC HISTORY: Oncology History  Primary lung adenocarcinoma (HCC)  10/10/2021 Initial Diagnosis   Primary lung adenocarcinoma (HCC) Stage IV  Patient seen by PCP for wheezing for 2 weeks. Imaging with CXR followed by CT chest showed Spiculated 2.1 x 2.2 cm left upper lobe pulmonary nodule with associated pleural tethering. Innumerable subcentimeter pulmonary nodules throughout the lungs.      11/14/2021 PET scan   IMPRESSION: 1. Left suprahilar nodule with maximum SUV 4.5, and a more distal 2.2 cm left upper lobe nodule with maximum SUV of 5.5. 2. Innumerable scattered small pulmonary nodules, most of which are under 5 mm in diameter, throughout both lungs. Some are cavitary. Possibilities include cavitating hematogenous disseminated malignancy versus infectious etiology subset as septic emboli. Most of the nodules are stable, some are minimally enlarged compared to  10/10/2021. 3. Abnormal mild permeative type bony findings in the right hemipelvis associated with substantially accentuated metabolic activity. Possible associated pathologic fracture through the right quadrilateral plate and right acetabulum. The findings involve the right ilium and ischium but also the right lateral sacral ala and a small focus in the left sacrum.    Pathology Results   She had transbronchial biopsies by Dr. Jayme Cloud on 11/19/21.  Pathology from LUL nodule showed adenocarcinoma, consistent with lung primary.    11/19/2021 Cancer Staging   Staging form: Lung, AJCC 8th Edition - Clinical stage from 11/19/2021: Stage IVB (cT1c, cM1c) - Signed by Michaelyn Barter, MD on 11/27/2021 Stage prefix: Initial diagnosis   11/27/2021 Imaging   MRI pelvis  IMPRESSION: 1. Abnormal marrow signal throughout the right ilium involving the right acetabulum, right superior pubic ramus and right inferior pubic ramus, right side of the sacrum and a smaller focus of abnormal marrow lesion in the left side of the sacrum. Findings are concerning for metastatic disease. 2. Nondisplaced pathologic fracture of the quadrilateral plate of the right acetabulum. 3. Mild muscle edema in the right gluteus minimus and medius muscles likely reflecting mild muscle strain.  MRI brain  IMPRESSION: No evidence of intracranial metastatic disease.   Indeterminate 1 cm left frontal calvarium lesion.   12/18/2021 - 08/27/2022 Chemotherapy   Patient is on Treatment Plan : LUNG Carboplatin (5) + Pemetrexed (500) + Pembrolizumab (200) D1 q21d Induction x 4 cycles / Maintenance Pemetrexed (500) + Pembrolizumab (200) D1 q21d     10/07/2022 -  Chemotherapy   Patient is on Treatment Plan : LUNG Carboplatin + Paclitaxel q21d Dose Reduction     Primary malignant neoplasm of left upper lobe of lung (HCC)  06/04/2022 Initial Diagnosis   Primary malignant neoplasm of left upper lobe of lung (HCC)   06/04/2022 Cancer  Staging   Staging form: Lung, AJCC 8th Edition - Pathologic: Stage IVB (pT1c, pNX, cM1c) - Signed by Earna Coder, MD on 06/04/2022     PHYSICAL EXAMINATION: ECOG PERFORMANCE STATUS: 2 - Symptomatic, <50% confined to bed  Vitals:   01/21/23 0859  BP: 121/76  Pulse: 96  Temp: 98.1 F (36.7 C)  SpO2: 100%    Filed Weights   01/21/23 0859  Weight: 136 lb (61.7 kg)     Physical Exam Constitutional:      Appearance: Normal appearance.  HENT:     Head: Normocephalic and atraumatic.  Cardiovascular:     Rate and Rhythm: Normal rate.  Pulmonary:     Effort: Pulmonary effort is normal.  Musculoskeletal:     Right lower leg: No edema.     Left lower leg: No edema.  Skin:    General: Skin is warm.  Neurological:     General: No focal deficit present.     Mental Status: She is oriented to person, place, and time.  Psychiatric:        Mood and Affect: Mood normal.     LABORATORY DATA:  I have reviewed the data as listed    Component Value Date/Time   NA 136 01/21/2023 0838   K 3.8 01/21/2023 0838   CL 106 01/21/2023 0838   CO2 22 01/21/2023 0838   GLUCOSE 103 (H) 01/21/2023 0838   BUN 28 (H) 01/21/2023 0838   CREATININE 1.53 (H) 01/21/2023 0838   CALCIUM 9.1 01/21/2023 0838   PROT 7.0 01/21/2023 0838   ALBUMIN 3.6 01/21/2023 0838   AST 23 01/21/2023 0838   ALT 14 01/21/2023 0838   ALKPHOS 212 (H) 01/21/2023 0838   BILITOT 0.6 01/21/2023 0838   GFRNONAA 36 (L) 01/21/2023 0838    No results found for: "SPEP", "UPEP"  Lab Results  Component Value Date   WBC 2.8 (L) 01/21/2023   NEUTROABS 1.5 (L) 01/21/2023   HGB 9.8 (L) 01/21/2023   HCT 29.6 (L) 01/21/2023   MCV 106.5 (H) 01/21/2023  PLT 100 (L) 01/21/2023      Chemistry      Component Value Date/Time   NA 136 01/21/2023 0838   K 3.8 01/21/2023 0838   CL 106 01/21/2023 0838   CO2 22 01/21/2023 0838   BUN 28 (H) 01/21/2023 0838   CREATININE 1.53 (H) 01/21/2023 0838      Component Value  Date/Time   CALCIUM 9.1 01/21/2023 0838   ALKPHOS 212 (H) 01/21/2023 0838   AST 23 01/21/2023 0838   ALT 14 01/21/2023 0838   BILITOT 0.6 01/21/2023 0838       RADIOGRAPHIC STUDIES: I have personally reviewed the radiological images as listed and agreed with the findings in the report. No results found.

## 2023-01-21 NOTE — Telephone Encounter (Signed)
[id number], has been received by [facility] and you will be receiving a confirmation email momentarily.  For the foundation one RNA add on that Dr. Alena Bills requested.

## 2023-01-21 NOTE — Progress Notes (Signed)
Nutrition Follow-up:  Patient with stage IV lung cancer. Started on second line carbo/taxol.    Met with patient and daughter during infusion.  Reports that appetite is decreased and having taste alterations.  Can't taste salt and sweet foods taste really sweet.  Did not start olanzapine.  Recently had shingles.  Had oatmeal for breakfast this am.  Dinner last night was chicken, butter beans and corn.  Drinking premier protein shake.      Medications: reviewed  Labs: reviewed  Anthropometrics:   Weight 136 lb today  139 lb 8/9 129 lb 7/29 141 lb on 4/16   NUTRITION DIAGNOSIS: Unintentional weight loss ongoing    INTERVENTION:  Discussed ways to add calories and protein (fruit and cheese or peanut butter, nuts, whole fat yogurt) Encouraged eating q 2 hours Encouraged 350+ calorie shake. Samples of ensure complete and boost [facility] given for patient to try.      MONITORING, EVALUATION, GOAL: weight trends, intake   NEXT VISIT: as needed  Victoria Foley, RD, LDN Registered Dietitian [phone]

## 2023-01-23 ENCOUNTER — Encounter: Payer: Self-pay | Admitting: Family Medicine

## 2023-01-23 ENCOUNTER — Ambulatory Visit: Payer: PPO | Admitting: Family Medicine

## 2023-01-23 VITALS — BP 114/70 | HR 97 | Temp 98.0°F | Resp 16 | Ht 59.0 in | Wt 134.2 lb

## 2023-01-23 DIAGNOSIS — C7931 Secondary malignant neoplasm of brain: Secondary | ICD-10-CM

## 2023-01-23 DIAGNOSIS — R000 Tachycardia, unspecified: Secondary | ICD-10-CM

## 2023-01-23 DIAGNOSIS — N1832 Chronic kidney disease, stage 3b: Secondary | ICD-10-CM

## 2023-01-23 DIAGNOSIS — N3289 Other specified disorders of bladder: Secondary | ICD-10-CM

## 2023-01-23 DIAGNOSIS — H9319 Tinnitus, unspecified ear: Secondary | ICD-10-CM | POA: Diagnosis not present

## 2023-01-23 DIAGNOSIS — T451X5A Adverse effect of antineoplastic and immunosuppressive drugs, initial encounter: Secondary | ICD-10-CM

## 2023-01-23 DIAGNOSIS — G620 Drug-induced polyneuropathy: Secondary | ICD-10-CM | POA: Diagnosis not present

## 2023-01-23 DIAGNOSIS — I10 Essential (primary) hypertension: Secondary | ICD-10-CM | POA: Diagnosis not present

## 2023-01-23 DIAGNOSIS — M8580 Other specified disorders of bone density and structure, unspecified site: Secondary | ICD-10-CM

## 2023-01-23 DIAGNOSIS — C3492 Malignant neoplasm of unspecified part of left bronchus or lung: Secondary | ICD-10-CM

## 2023-01-23 MED ORDER — LOSARTAN POTASSIUM 100 MG PO TABS
100.0000 mg | ORAL_TABLET | Freq: Every day | ORAL | 3 refills | Status: DC
Start: 2023-01-23 — End: 2023-12-23

## 2023-01-23 MED ORDER — NEBIVOLOL HCL 2.5 MG PO TABS: 2.5000 mg | ORAL_TABLET | Freq: Every day | ORAL | 3 refills | Status: DC

## 2023-01-23 MED ORDER — NEBIVOLOL HCL 2.5 MG PO TABS
2.5000 mg | ORAL_TABLET | Freq: Every day | ORAL | 3 refills | Status: DC
Start: 2023-01-23 — End: 2023-01-23

## 2023-01-23 NOTE — Progress Notes (Signed)
SUBJECTIVE:   Chief Complaint  Patient presents with   Establish Care   HPI Presents to clinic to establish care  Discussed the use of AI scribe software for clinical note transcription with the patient, who gave verbal consent to proceed.  History of Present Illness  The patient, a 72 year old with a history of cancer, presented for a transfer of care. The patient's primary concern was their ongoing cancer treatment, which included chemotherapy every 21 days. The patient reported experiencing weakness post-chemotherapy, but otherwise felt relatively well.  The patient also reported a history of lung condition for which they use Albuterol as needed. They had recently experienced a bout of shingles, for which they were prescribed Valtrex. The patient reported that the shingles had mostly cleared up and they had stopped taking the Valtrex a few days prior to the consultation due to its drying effect.  The patient was also on a regimen of various medications including losartan for blood pressure, which they reported as being well-controlled. They had been out of Nebivolol, another blood pressure medication, for two days at the time of the consultation. The patient reported a slight decrease in blood pressure during this period.  The patient also reported a long-standing issue of ringing in their ears, which had been present for about ten years. They had previously consulted a doctor about this issue, who had informed them that there was nothing that could be done about it.  The patient had been experiencing some constipation, which had recently resolved. They also reported numbness in their toes, which they had discussed with their doctor. The patient was aware of a thickening in their bladder and had an upcoming appointment with a urologist to address this issue.  The patient had been losing weight due to a decreased appetite, which they attributed to their chemotherapy treatment. They  reported eating two meals a day with some snacks in between. They had been advised to take Ensure for additional protein and calories, but had been forgetting to do so.  The patient had a right upper port for their chemotherapy treatment. They were due for their sixth round of chemotherapy at the end of the month, after which they might take a break depending on their condition. The patient was aware that they would be on chemotherapy treatment indefinitely.  The patient had not had any recent vaccinations and was unsure whether it was safe for them to do so given their condition. They were advised to consult their oncologist about this.    PERTINENT PMH / PSH: As above  OBJECTIVE:  BP 114/70   Pulse 97   Temp 98 F (36.7 C)   Resp 16   Ht 4\' 11"  (1.499 m)   Wt 134 lb 4 oz (60.9 kg)   SpO2 98%   BMI 27.12 kg/m    Physical Exam Vitals reviewed.  Constitutional:      General: She is not in acute distress.    Appearance: Normal appearance. She is not ill-appearing.  HENT:     Head: Normocephalic.     Right Ear: Tympanic membrane, ear canal and external ear normal.     Left Ear: Tympanic membrane, ear canal and external ear normal.     Nose: Nose normal.     Mouth/Throat:     Mouth: Mucous membranes are moist.  Eyes:     Extraocular Movements: Extraocular movements intact.     Conjunctiva/sclera: Conjunctivae normal.     Pupils: Pupils are equal, round, and  reactive to light.  Neck:     Thyroid: No thyromegaly or thyroid tenderness.     Vascular: No carotid bruit.  Cardiovascular:     Rate and Rhythm: Normal rate and regular rhythm.     Pulses: Normal pulses.     Heart sounds: Normal heart sounds.  Pulmonary:     Effort: Pulmonary effort is normal.     Breath sounds: Normal breath sounds.  Abdominal:     General: Bowel sounds are normal. There is no distension.     Palpations: Abdomen is soft.     Tenderness: There is no abdominal tenderness. There is no right CVA  tenderness, left CVA tenderness, guarding or rebound.  Musculoskeletal:        General: Normal range of motion.     Cervical back: Normal range of motion.     Right lower leg: No edema.     Left lower leg: No edema.  Lymphadenopathy:     Cervical: No cervical adenopathy.  Skin:    Capillary Refill: Capillary refill takes less than 2 seconds.  Neurological:     General: No focal deficit present.     Mental Status: She is alert and oriented to person, place, and time. Mental status is at baseline.     Motor: No weakness.  Psychiatric:        Mood and Affect: Mood normal.        Behavior: Behavior normal.        Thought Content: Thought content normal.        Judgment: Judgment normal.        01/23/2023    9:57 AM 12/20/2021   11:38 AM 10/04/2021   10:52 AM 12/19/2020   10:49 AM 10/03/2020    9:41 AM  Depression screen PHQ 2/9  Decreased Interest 0 0 0 0 1  Down, Depressed, Hopeless 0 0 1 0 1  PHQ - 2 Score 0 0 1 0 2  Altered sleeping 0    1  Tired, decreased energy 1    1  Change in appetite 2    1  Feeling bad or failure about yourself  0    1  Trouble concentrating 0    1  Moving slowly or fidgety/restless 0    0  Suicidal thoughts 0    1  PHQ-9 Score 3    8  Difficult doing work/chores Not difficult at all    Not difficult at all      01/23/2023    9:57 AM 10/03/2020    9:42 AM 09/30/2019    9:41 AM 07/21/2019    1:40 PM  GAD 7 : Generalized Anxiety Score  Nervous, Anxious, on Edge 0 2 0 0  Control/stop worrying 0 1 0 0  Worry too much - different things 0 1 0 0  Trouble relaxing 0 1 0 0  Restless 0 0 0 0  Easily annoyed or irritable 0 0 0 0  Afraid - awful might happen 0 1 0 0  Total GAD 7 Score 0 6 0 0  Anxiety Difficulty Not difficult at all Not difficult at all Not difficult at all Not difficult at all    ASSESSMENT/PLAN:  Chemotherapy-induced neuropathy (HCC) Assessment & Plan: Undergoing chemotherapy every 21 days, with possible break after the sixth  round. Experiencing some weakness post-chemo and numbness in toes, likely due to chemotherapy. -Continue current chemotherapy regimen. -Report any changes in symptoms.   Essential hypertension Assessment & Plan: Blood  pressure well-controlled on losartan 100mg , with recent lower readings. Nebivolol held for the past two days without significant increase in blood pressure. -Continue losartan 100mg . -Hold nebivolol and monitor blood pressure at home. -If blood pressure rises above 130/90, restart nebivolol.  Orders: -     Losartan Potassium; Take 1 tablet (100 mg total) by mouth daily.  Dispense: 90 tablet; Refill: 3  Tinnitus, unspecified laterality Assessment & Plan: Long-standing issue, not related to current medications or conditions. -No changes recommended at this time.   Bladder wall thickening Assessment & Plan: Scheduled to see a urologist for further evaluation. -Attend urology appointment as scheduled.   Primary adenocarcinoma of left lung [facility]) Assessment & Plan: Follows with Oncology   Metastasis to brain [facility]) Assessment & Plan: Follows with Oncology   Stage 3b chronic kidney disease (HCC) Assessment & Plan: Follows with Nephrology   Osteopenia, unspecified location Assessment & Plan: Reports history of osteopenia Taking calcium 200mg  and vitamin D daily. Discussed the need for increased calcium intake (1200mg  daily) and vitamin D (800 international units daily). -Consider increasing calcium intake to 1200mg  daily through diet or additional supplements. -Continue vitamin D daily. -Consider switching to [place] (calcium and vitamin D supplement) for convenience.    Vaccinations Tetanus vaccine due, but patient's white count is low due to chemotherapy. Wants to consult with oncologist before administering tetanus vaccine.   PDMP reviewed  Return in about 3 months (around 04/25/2023) for PCP.  Dana Allan, MD

## 2023-01-23 NOTE — Patient Instructions (Addendum)
It was a pleasure meeting you today. Thank you for allowing me to take part in your health care.  Our goals for today as we discussed include:  Blood pressure is on the soft side As you had not taken your Bystolic for a couple of days continue to hold this medication. If blood pressure increases to >130/80 restart Bystolic  Continue Losartan 100 mg daily  Notify MD if restarting Bystolic.  Follow up in 3 months   This is a list of the screening recommended for you and due dates:  Health Maintenance  Topic Date Due   DTaP/Tdap/Td vaccine (1 - Tdap) Never done   Zoster (Shingles) Vaccine (1 of 2) Never done   Medicare Annual Wellness Visit  12/21/2022   Pneumonia Vaccine (1 of 1 - PCV) 10/17/2023*   Colon Cancer Screening  07/22/2023   Mammogram  02/28/2024   DEXA scan (bone density measurement)  Completed   Hepatitis C Screening  Completed   HPV Vaccine  Aged Out   Flu Shot  Discontinued   COVID-19 Vaccine  Discontinued  *Topic was postponed. The date shown is not the original due date.      If you have any questions or concerns, please do not hesitate to call the office at [phone].  I look forward to our next visit and until then take care and stay safe.  Regards,   Dana Allan, MD   [facility]

## 2023-01-27 ENCOUNTER — Inpatient Hospital Stay: Payer: PPO

## 2023-01-27 VITALS — BP 119/68 | HR 116 | Temp 97.5°F | Resp 20

## 2023-01-27 DIAGNOSIS — C3490 Malignant neoplasm of unspecified part of unspecified bronchus or lung: Secondary | ICD-10-CM

## 2023-01-27 DIAGNOSIS — Z5111 Encounter for antineoplastic chemotherapy: Secondary | ICD-10-CM | POA: Diagnosis not present

## 2023-01-27 LAB — CBC WITH DIFFERENTIAL/PLATELET
Abs Immature Granulocytes: 0 10*3/uL (ref 0.00–0.07)
Basophils Absolute: 0 10*3/uL (ref 0.0–0.1)
Basophils Relative: 0 %
Eosinophils Absolute: 0.1 10*3/uL (ref 0.0–0.5)
Eosinophils Relative: 3 %
HCT: 29.9 % — ABNORMAL LOW (ref 36.0–46.0)
Hemoglobin: 10.1 g/dL — ABNORMAL LOW (ref 12.0–15.0)
Immature Granulocytes: 0 %
Lymphocytes Relative: 37 %
Lymphs Abs: 0.7 10*3/uL (ref 0.7–4.0)
MCH: 35.7 pg — ABNORMAL HIGH (ref 26.0–34.0)
MCHC: 33.8 g/dL (ref 30.0–36.0)
MCV: 105.7 fL — ABNORMAL HIGH (ref 80.0–100.0)
Monocytes Absolute: 0.1 10*3/uL (ref 0.1–1.0)
Monocytes Relative: 3 %
Neutro Abs: 1 10*3/uL — ABNORMAL LOW (ref 1.7–7.7)
Neutrophils Relative %: 57 %
Platelets: 71 10*3/uL — ABNORMAL LOW (ref 150–400)
RBC: 2.83 MIL/uL — ABNORMAL LOW (ref 3.87–5.11)
RDW: 14.5 % (ref 11.5–15.5)
WBC: 1.8 10*3/uL — ABNORMAL LOW (ref 4.0–10.5)
nRBC: 0 % (ref 0.0–0.2)

## 2023-01-27 LAB — CMP (CANCER CENTER ONLY)
ALT: 17 U/L (ref 0–44)
AST: 24 U/L (ref 15–41)
Albumin: 3.5 g/dL (ref 3.5–5.0)
Alkaline Phosphatase: 166 U/L — ABNORMAL HIGH (ref 38–126)
Anion gap: 6 (ref 5–15)
BUN: 22 mg/dL (ref 8–23)
CO2: 21 mmol/L — ABNORMAL LOW (ref 22–32)
Calcium: 8.6 mg/dL — ABNORMAL LOW (ref 8.9–10.3)
Chloride: 105 mmol/L (ref 98–111)
Creatinine: 1.37 mg/dL — ABNORMAL HIGH (ref 0.44–1.00)
GFR, Estimated: 41 mL/min — ABNORMAL LOW (ref >60)
Glucose, Bld: 106 mg/dL — ABNORMAL HIGH (ref 70–99)
Potassium: 4 mmol/L (ref 3.5–5.1)
Sodium: 132 mmol/L — ABNORMAL LOW (ref 135–145)
Total Bilirubin: 0.7 mg/dL (ref 0.3–1.2)
Total Protein: 6.7 g/dL (ref 6.5–8.1)

## 2023-01-27 MED ORDER — SODIUM CHLORIDE 0.9% FLUSH
10.0000 mL | Freq: Once | INTRAVENOUS | Status: AC
  Administered 2023-01-27: 10 mL via INTRAVENOUS
  Filled 2023-01-27: qty 10

## 2023-01-27 MED ORDER — HEPARIN SOD (PORK) LOCK FLUSH 100 UNIT/ML IV SOLN
500.0000 [IU] | Freq: Once | INTRAVENOUS | Status: AC
  Administered 2023-01-27: 500 [IU] via INTRAVENOUS
  Filled 2023-01-27: qty 5

## 2023-01-27 MED ORDER — SODIUM CHLORIDE 0.9 % IV SOLN
Freq: Once | INTRAVENOUS | Status: AC
  Filled 2023-01-27: qty 250

## 2023-01-28 ENCOUNTER — Ambulatory Visit: Payer: PPO | Admitting: Urology

## 2023-01-28 ENCOUNTER — Encounter: Payer: Self-pay | Admitting: Urology

## 2023-01-28 ENCOUNTER — Other Ambulatory Visit: Payer: Self-pay | Admitting: *Deleted

## 2023-01-28 ENCOUNTER — Other Ambulatory Visit
Admission: RE | Admit: 2023-01-28 | Discharge: 2023-01-28 | Disposition: A | Discharge status: Home / Self Care | Payer: PPO | Attending: Urology | Admitting: Urology

## 2023-01-28 VITALS — BP 127/76 | HR 133 | Ht 59.0 in | Wt 136.0 lb

## 2023-01-28 DIAGNOSIS — N3289 Other specified disorders of bladder: Secondary | ICD-10-CM | POA: Diagnosis present

## 2023-01-28 LAB — URINALYSIS, COMPLETE (UACMP) WITH MICROSCOPIC
Bilirubin Urine: NEGATIVE
Glucose, UA: NEGATIVE mg/dL
Hgb urine dipstick: NEGATIVE
Ketones, ur: NEGATIVE mg/dL
Leukocytes,Ua: NEGATIVE
Nitrite: NEGATIVE
Protein, ur: NEGATIVE mg/dL
RBC / HPF: NONE SEEN RBC/hpf (ref 0–5)
Specific Gravity, Urine: 1.01 (ref 1.005–1.030)
pH: 6.5 (ref 5.0–8.0)

## 2023-01-28 NOTE — Progress Notes (Signed)
01/28/23 1:41 PM   Victoria Foley 1950-10-20 [id number]  CC: Bladder wall thickening  HPI: 72 year old female with stage IV metastatic lung cancer with fairly extensive disease including brain and bone lesions.  Currently under treatment with oncology.  She was referred for subtle bladder wall thickening seen on recent CT scan.  She denies any urinary symptoms, has had completely negative urinalysis in September 2024 as well as today with no microscopic hematuria or pyuria.  She denies any gross hematuria or urinary symptoms.   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   COVID-19    03/2021   Hypertension    Pneumonia    age 57   Pre-diabetes    Prediabetes    Sciatica     Surgical History: Past Surgical History:  Procedure Laterality Date   BRONCHIAL BIOPSY  12/17/2022   Procedure: BRONCHIAL BIOPSIES;  Surgeon: Josephine Igo, DO;  Location: [facility];  Service: Pulmonary;;   BRONCHIAL NEEDLE ASPIRATION BIOPSY  12/17/2022   Procedure: BRONCHIAL NEEDLE ASPIRATION BIOPSIES;  Surgeon: Josephine Igo, DO;  Location: [facility];  Service: Pulmonary;;   CESAREAN SECTION     COLONOSCOPY W/ POLYPECTOMY     ECTOPIC PREGNANCY SURGERY     FEMUR SURGERY     left, s/p rod for fracture   IR IMAGING GUIDED PORT INSERTION  12/11/2021   TUBAL LIGATION     Family History: Family History  Problem Relation Age of Onset   Diabetes Mother    Hypertension Mother    Heart disease Mother    Cancer Mother        breast and stomach   Breast cancer Mother 54   Diabetes Father    Hypertension Father    Heart disease Father    Kidney disease Father     Social History:  reports that she has never smoked. She has never been exposed to tobacco smoke. She has never used smokeless tobacco. She reports that she does not drink  alcohol and does not use drugs.  Physical Exam: BP 127/76   Pulse (!) 133   Ht 4\' 11"  (1.499 m)   Wt 136 lb (61.7 kg)   SpO2 100%   BMI 27.47 kg/m    Constitutional:  Alert and oriented, No acute distress. Cardiovascular: No clubbing, cyanosis, or edema. Respiratory: Normal respiratory effort, no increased work of breathing. GI: Abdomen is soft, nontender, nondistended, no abdominal masses   Laboratory Data: Reviewed, see HPI  Pertinent Imaging: I have personally viewed and interpreted the CT scan from 12/10/2022 showing metastatic lung cancer as well as some very subtle bladder wall thickening in the right bladder wall.  Assessment & Plan:   72 year old female with widely metastatic stage IV lung cancer under the treatment of oncology, referred for subtle bladder wall thickening seen on recent CT scan.  I reviewed the outside oncology notes extensively, as well as the numerous scans.  Urinalysis has been benign with no microscopic hematuria, she denies any gross hematuria or urinary symptoms.  We discussed options including observation or cystoscopy.  Her CT is overall reassuring, and I have a relatively low suspicion for malignancy in the bladder.  Using shared decision making she opted to defer cystoscopy which I think is very reasonable.  Will have her follow-up 1 more time in early December after her next CT to confirm no changes or progression of this subtle thickening in the right bladder wall.  Return precautions were discussed at length including gross or microscopic hematuria that would warrant cystoscopy.  Low suspicion for bladder pathology RTC early December to review next CT scan  Legrand Rams, MD 01/28/2023  [facility] [address], [address] [place], [place] [address] [phone]

## 2023-01-31 ENCOUNTER — Encounter: Payer: Self-pay | Admitting: Family Medicine

## 2023-01-31 DIAGNOSIS — N183 Chronic kidney disease, stage 3 unspecified: Secondary | ICD-10-CM | POA: Insufficient documentation

## 2023-01-31 DIAGNOSIS — M8580 Other specified disorders of bone density and structure, unspecified site: Secondary | ICD-10-CM | POA: Insufficient documentation

## 2023-01-31 DIAGNOSIS — N3289 Other specified disorders of bladder: Secondary | ICD-10-CM | POA: Insufficient documentation

## 2023-01-31 NOTE — Assessment & Plan Note (Signed)
Scheduled to see a urologist for further evaluation. -Attend urology appointment as scheduled.

## 2023-01-31 NOTE — Assessment & Plan Note (Signed)
Undergoing chemotherapy every 21 days, with possible break after the sixth round. Experiencing some weakness post-chemo and numbness in toes, likely due to chemotherapy. -Continue current chemotherapy regimen. -Report any changes in symptoms.

## 2023-01-31 NOTE — Assessment & Plan Note (Signed)
Follows with Nephrology

## 2023-01-31 NOTE — Assessment & Plan Note (Signed)
Reports history of osteopenia Taking calcium 200mg  and vitamin D daily. Discussed the need for increased calcium intake (1200mg  daily) and vitamin D (800 international units daily). -Consider increasing calcium intake to 1200mg  daily through diet or additional supplements. -Continue vitamin D daily. -Consider switching to Oscal (calcium and vitamin D supplement) for convenience.

## 2023-01-31 NOTE — Assessment & Plan Note (Signed)
Long-standing issue, not related to current medications or conditions. -No changes recommended at this time.

## 2023-01-31 NOTE — Assessment & Plan Note (Addendum)
Follows with Oncology

## 2023-01-31 NOTE — Assessment & Plan Note (Signed)
Blood pressure well-controlled on losartan 100mg , with recent lower readings. Nebivolol held for the past two days without significant increase in blood pressure. -Continue losartan 100mg . -Hold nebivolol and monitor blood pressure at home. -If blood pressure rises above 130/90, restart nebivolol.

## 2023-02-03 ENCOUNTER — Inpatient Hospital Stay: Payer: PPO

## 2023-02-05 ENCOUNTER — Encounter: Payer: Self-pay | Admitting: Internal Medicine

## 2023-02-05 ENCOUNTER — Ambulatory Visit: Payer: PPO | Admitting: *Deleted

## 2023-02-05 VITALS — Ht 59.0 in | Wt 136.0 lb

## 2023-02-05 DIAGNOSIS — Z0000 Encounter for general adult medical examination without abnormal findings: Secondary | ICD-10-CM | POA: Diagnosis not present

## 2023-02-05 NOTE — Patient Instructions (Addendum)
Ms. Victoria Foley , Thank you for taking time to come for your Medicare Wellness Visit. I appreciate your ongoing commitment to your health goals. Please review the following plan we discussed and let me know if I can assist you in the future.   Referrals/Orders/Follow-Ups/Clinician Recommendations: Please check with your cancer doctor about taking vaccines and other test needed.  This is a list of the screening recommended for you and due dates:  Health Maintenance  Topic Date Due   DTaP/Tdap/Td vaccine (1 - Tdap) Never done   Zoster (Shingles) Vaccine (1 of 2) Never done   Pneumonia Vaccine (1 of 1 - PCV) 10/17/2023*   Mammogram  02/28/2023   Colon Cancer Screening  07/22/2023   Medicare Annual Wellness Visit  02/05/2024   DEXA scan (bone density measurement)  Completed   Hepatitis C Screening  Completed   HPV Vaccine  Aged Out   Flu Shot  Discontinued   COVID-19 Vaccine  Discontinued  *Topic was postponed. The date shown is not the original due date.    Advanced directives: (Copy Requested) Please bring a copy of your health care power of attorney and living will to the office to be added to your chart at your convenience.  Next Medicare Annual Wellness Visit scheduled for next year: Yes 02/06/24 @ 10:20   Managing Pain Without Opioids Opioids are strong medicines used to treat moderate to severe pain. For some people, especially those who have long-term (chronic) pain, opioids may not be the best choice for pain management due to: Side effects like nausea, constipation, and sleepiness. The risk of addiction (opioid use disorder). The longer you take opioids, the greater your risk of addiction. Pain that lasts for more than 3 months is called chronic pain. Managing chronic pain usually requires more than one approach and is often provided by a team of health care providers working together (multidisciplinary approach). Pain management may be done at a pain management center or pain  clinic. How to manage pain without the use of opioids Use non-opioid medicines Non-opioid medicines for pain may include: Over-the-counter or prescription non-steroidal anti-inflammatory drugs (NSAIDs). These may be the first medicines used for pain. They work well for muscle and bone pain, and they reduce swelling. Acetaminophen. This over-the-counter medicine may work well for milder pain but not swelling. Antidepressants. These may be used to treat chronic pain. A certain type of antidepressant (tricyclics) is often used. These medicines are given in lower doses for pain than when used for depression. Anticonvulsants. These are usually used to treat seizures but may also reduce nerve (neuropathic) pain. Muscle relaxants. These relieve pain caused by sudden muscle tightening (spasms). You may also use a pain medicine that is applied to the skin as a patch, cream, or gel (topical analgesic), such as a numbing medicine. These may cause fewer side effects than medicines taken by mouth. Do certain therapies as directed Some therapies can help with pain management. They include: Physical therapy. You will do exercises to gain strength and flexibility. A physical therapist may teach you exercises to move and stretch parts of your body that are weak, stiff, or painful. You can learn these exercises at physical therapy visits and practice them at home. Physical therapy may also involve: Massage. Heat wraps or applying heat or cold to affected areas. Electrical signals that interrupt pain signals (transcutaneous electrical nerve stimulation, TENS). Weak lasers that reduce pain and swelling (low-level laser therapy). Signals from your body that help you learn to regulate  pain (biofeedback). Occupational therapy. This helps you to learn ways to function at home and work with less pain. Recreational therapy. This involves trying new activities or hobbies, such as a physical activity or drawing. Mental  health therapy, including: Cognitive behavioral therapy (CBT). This helps you learn coping skills for dealing with pain. Acceptance and commitment therapy (ACT) to change the way you think and react to pain. Relaxation therapies, including muscle relaxation exercises and mindfulness-based stress reduction. Pain management counseling. This may be individual, family, or group counseling.  Receive medical treatments Medical treatments for pain management include: Nerve block injections. These may include a pain blocker and anti-inflammatory medicines. You may have injections: Near the spine to relieve chronic back or neck pain. Into joints to relieve back or joint pain. Into nerve areas that supply a painful area to relieve body pain. Into muscles (trigger point injections) to relieve some painful muscle conditions. A medical device placed near your spine to help block pain signals and relieve nerve pain or chronic back pain (spinal cord stimulation device). Acupuncture. Follow these instructions at home Medicines Take over-the-counter and prescription medicines only as told by your health care provider. If you are taking pain medicine, ask your health care providers about possible side effects to watch out for. Do not drive or use heavy machinery while taking prescription opioid pain medicine. Lifestyle  Do not use drugs or alcohol to reduce pain. If you drink alcohol, limit how much you have to: 0-1 drink a day for women who are not pregnant. 0-2 drinks a day for men. Know how much alcohol is in a drink. In the [place] one drink equals one 12 oz bottle of beer (355 mL), one 5 oz glass of wine (148 mL), or one 1 oz glass of hard liquor (44 mL). Do not use any products that contain nicotine or tobacco. These products include cigarettes, chewing tobacco, and vaping devices, such as e-cigarettes. If you need help quitting, ask your health care provider. Eat a healthy diet and maintain a healthy  weight. Poor diet and excess weight may make pain worse. Eat foods that are high in fiber. These include fresh fruits and vegetables, whole grains, and beans. Limit foods that are high in fat and processed sugars, such as fried and sweet foods. Exercise regularly. Exercise lowers stress and may help relieve pain. Ask your health care provider what activities and exercises are safe for you. If your health care provider approves, join an exercise class that combines movement and stress reduction. Examples include yoga and tai chi. Get enough sleep. Lack of sleep may make pain worse. Lower stress as much as possible. Practice stress reduction techniques as told by your therapist. General instructions Work with all your pain management providers to find the treatments that work best for you. You are an important member of your pain management team. There are many things you can do to reduce pain on your own. Consider joining an online or in-person support group for people who have chronic pain. Keep all follow-up visits. This is important. Where to find more information You can find more information about managing pain without opioids from: American Academy of Pain Medicine: painmed.org Institute for Chronic Pain: instituteforchronicpain.org American Chronic Pain Association: theacpa.org Contact a health care provider if: You have side effects from pain medicine. Your pain gets worse or does not get better with treatments or home therapy. You are struggling with anxiety or depression. Summary Many types of pain can be managed  without opioids. Chronic pain may respond better to pain management without opioids. Pain is best managed when you and a team of health care providers work together. Pain management without opioids may include non-opioid medicines, medical treatments, physical therapy, mental health therapy, and lifestyle changes. Tell your health care providers if your pain gets worse or  is not being managed well enough. This information is not intended to replace advice given to you by your health care provider. Make sure you discuss any questions you have with your health care provider. Document Revised: 07/19/2020 Document Reviewed: 07/19/2020 Elsevier Patient Education  2024 [employer].

## 2023-02-05 NOTE — Progress Notes (Signed)
Subjective:   Victoria Foley is a 72 y.o. female who presents for Medicare Annual (Subsequent) preventive examination.  Visit Complete: Virtual I connected with  KIEANA LIVESAY on 02/05/23 by a audio enabled telemedicine application and verified that I am speaking with the correct person using two identifiers.  Patient Location: Home  Provider Location: Office/Clinic  I discussed the limitations of evaluation and management by telemedicine. The patient expressed understanding and agreed to proceed.  Vital Signs: Because this visit was a virtual/telehealth visit, some criteria may be missing or patient reported. Any vitals not documented were not able to be obtained and vitals that have been documented are patient reported.  Cardiac Risk Factors include: advanced age (>4men, >83 women);hypertension     Objective:    Today's Vitals   02/05/23 1033  Weight: 136 lb (61.7 kg)  Height: 4\' 11"  (1.499 m)   Body mass index is 27.47 kg/m.     02/05/2023   10:46 AM 01/21/2023   10:13 AM 01/21/2023    8:56 AM 01/10/2023    9:23 AM 01/09/2023    8:41 AM 12/19/2022    9:00 AM 12/19/2022    8:34 AM  Advanced Directives  Does Patient Have a Medical Advance Directive? Yes Yes Yes No Yes Yes Yes  Type of Estate agent of [place];Living will Healthcare Power of [employer] of [place];Living will   Healthcare Power of [place];Living will Healthcare Power of [place];Living will  Does patient want to make changes to medical advance directive?  No - Patient declined  No - Patient declined  No - Patient declined   Copy of Healthcare Power of Attorney in Chart? No - copy requested No - copy requested No - copy requested   Yes - validated most recent copy scanned in chart (See row information) Yes - validated most recent copy scanned in chart (See row information)  Would patient like information on creating a medical advance directive?    No - Patient declined        Current Medications (verified) Outpatient Encounter Medications as of 02/05/2023  Medication Sig   albuterol (VENTOLIN HFA) 108 (90 Base) MCG/ACT inhaler Inhale 2 puffs into the lungs every 6 (six) hours as needed for wheezing or shortness of breath.   Azelastine HCl 137 MCG/SPRAY SOLN Place 2 sprays into both nostrils 2 (two) times daily.   Cholecalciferol (VITAMIN D-3 PO) Take by mouth.   dexamethasone (DECADRON) 4 MG tablet Take 1 tablet (4 mg total) by mouth daily. Take for 3 days after chemotherapy   fluticasone (FLONASE) 50 MCG/ACT nasal spray USE TWO SPRAYS IN EACH NOSTRIL ONCE DAILY prn   loratadine (CLARITIN) 10 MG tablet TAKE ONE TABLET BY MOUTH DAILY AS NEEDED FOR ALLERGIES   losartan (COZAAR) 100 MG tablet Take 1 tablet (100 mg total) by mouth daily.   ondansetron (ZOFRAN) 8 MG tablet Take 1 tablet (8 mg total) by mouth every 8 (eight) hours as needed for nausea or vomiting.   traMADol (ULTRAM) 50 MG tablet TAKE 1 TABLET BY MOUTH EVERY 6 HOURS AS NEEDED   magic mouthwash w/lidocaine SOLN Take 5 mLs by mouth 4 (four) times daily as needed for mouth pain. Sig: Swish/Swallow 5-10 ml four times a day as needed. Dispense 480 ml. 1RF (Patient not taking: Reported on 02/05/2023)   MULTIPLE VITAMIN PO Take 1 tablet by mouth daily. (Patient not taking: Reported on 02/05/2023)   valACYclovir (VALTREX) 500 MG tablet Take 1 tablet (500  mg total) by mouth 2 (two) times daily. 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   Chronic kidney  disease    probably caused by chemo.   COVID-19    03/2021   Hypertension    Pneumonia    age 49   Pre-diabetes    Prediabetes    Sciatica    Past Surgical History:  Procedure Laterality Date   BRONCHIAL BIOPSY  12/17/2022   Procedure: BRONCHIAL BIOPSIES;  Surgeon: Josephine Igo, DO;  Location: [facility];  Service: Pulmonary;;   BRONCHIAL NEEDLE ASPIRATION BIOPSY  12/17/2022   Procedure: BRONCHIAL NEEDLE ASPIRATION BIOPSIES;  Surgeon: Josephine Igo, DO;  Location: [facility];  Service: Pulmonary;;   CESAREAN SECTION     COLONOSCOPY W/ POLYPECTOMY     ECTOPIC PREGNANCY SURGERY     FEMUR SURGERY     left, s/p rod for fracture   IR IMAGING GUIDED PORT INSERTION  12/11/2021   TUBAL LIGATION     Family History  Problem Relation Age of Onset   Diabetes Mother    Hypertension Mother    Heart disease Mother    Cancer Mother        breast and stomach   Breast cancer Mother 43   Diabetes Father    Hypertension Father    Heart disease Father    Kidney disease Father    Social History   Socioeconomic History   Marital status: Widowed    Spouse name: Not on file   Number of children: Not on file   Years of education: Not on file   Highest education level: Not on file  Occupational History   Not on file  Tobacco Use   Smoking status: Never    Passive exposure: Never   Smokeless tobacco: Never  Vaping Use   Vaping status: Never Used  Substance and Sexual Activity   Alcohol use: Never   Drug use: Never   Sexual activity: Not Currently  Other Topics Concern   Not on file  Social History Narrative   Lives in [place] but husband died 07/02/2020 motorcycle accident bday 10/19/20 married 47 years   No pets   2 daughters and 3 granddaughters      Work - [employer] retired 09/2019   Diet - regular diet   Exercise - none   Social Determinants of Health   Financial Resource Strain: Low Risk  (02/05/2023)   Overall Financial Resource Strain (CARDIA)     Difficulty of Paying Living Expenses: Not hard at all  Food Insecurity: No Food Insecurity (02/05/2023)   Hunger Vital Sign    Worried About Running Out of Food in the Last Year: Never true    Ran Out of Food in the Last Year: Never true  Transportation Needs: No Transportation Needs (02/05/2023)   PRAPARE - Administrator, Civil Service (Medical): No    Lack of Transportation (Non-Medical): No  Physical Activity: Inactive (02/05/2023)   Exercise Vital Sign    Days  of Exercise per Week: 0 days    Minutes of Exercise per Session: 0 min  Stress: No Stress Concern Present (02/05/2023)   [employer] of Occupational Health - Occupational Stress Questionnaire    Feeling of Stress : Not at all  Social Connections: Moderately Isolated (02/05/2023)   Social Connection and Isolation Panel [NHANES]    Frequency of Communication with Friends and Family: More than three times a week    Frequency of Social Gatherings with Friends and Family: More than three times a week    Attends Religious Services: More than 4 times per year    Active Member of [employer] or Organizations: No    Attends Banker Meetings: Never    Marital Status: Widowed    Tobacco Counseling Counseling given: Not Answered   Clinical Intake:  Pre-visit preparation completed: Yes  Pain : No/denies pain     BMI - recorded: 27.47 Nutritional Status: BMI 25 -29 Overweight Nutritional Risks: None Diabetes: No  How often do you need to have someone help you when you read instructions, pamphlets, or other written materials from your doctor or pharmacy?: 1 - Never  Interpreter Needed?: No  Information entered by :: R. Tharun Cappella LPN   Activities of Daily Living    02/05/2023   10:35 AM  In your present state of health, do you have any difficulty performing the following activities:  Hearing? 0  Vision? 0  Comment readers  Difficulty concentrating or making decisions? 0  Walking or climbing  stairs? 1  Comment climbing stairs  Dressing or bathing? 0  Doing errands, shopping? 0  Preparing Food and eating ? N  Using the Toilet? N  In the past six months, have you accidently leaked urine? N  Do you have problems with loss of bowel control? N  Managing your Medications? N  Managing your Finances? N  Housekeeping or managing your Housekeeping? N    Patient Care Team: Dana Allan, MD as PCP - General (Family Medicine) Glory Buff, RN as Oncology Nurse Navigator Michaelyn Barter, MD as Consulting Physician (Oncology)  Indicate any recent Medical Services you may have received from other than [facility] providers in the past year (date may be approximate).     Assessment:   This is a routine wellness examination for [place].  Hearing/Vision screen Hearing Screening - Comments:: No issues Vision Screening - Comments:: readers   Goals Addressed             This Visit's Progress    Patient Stated       Wants to start back riding exercise bike, join some kind of outside activity       Depression Screen    02/05/2023   10:40 AM 01/23/2023    9:57 AM 12/20/2021   11:38 AM 10/04/2021   10:52 AM 12/19/2020   10:49 AM 10/03/2020    9:41 AM 09/30/2019    9:41 AM  PHQ 2/9 Scores  PHQ - 2 Score 0 0 0 1 0 2 0  PHQ- 9 Score 2 3    8      Fall Risk    02/05/2023   10:37 AM 01/23/2023    9:57 AM 12/20/2021   12:55 PM 10/04/2021   10:52 AM 12/19/2020   10:51 AM  Fall Risk   Falls in the past year? 0 0 0 0 0  Number falls in past yr: 0 0  1   Injury with Fall? 0 0  0   Risk  for fall due to : No Fall Risks No Fall Risks Impaired balance/gait History of fall(s)   Risk for fall due to: Comment   Cane/walker in use    Follow up Falls prevention discussed;Falls evaluation completed Falls evaluation completed Falls evaluation completed Falls evaluation completed Falls evaluation completed    MEDICARE RISK AT HOME: Medicare Risk at Home Any stairs in or around the home?: Yes If  so, are there any without handrails?: No Home free of loose throw rugs in walkways, pet beds, electrical cords, etc?: Yes Adequate lighting in your home to reduce risk of falls?: Yes Life alert?: No Use of a cane, walker or w/c?: Yes Grab bars in the bathroom?: Yes Shower chair or bench in shower?: No Elevated toilet seat or a handicapped toilet?: No     Cognitive Function:        02/05/2023   10:46 AM 12/20/2021   12:56 PM  6CIT Screen  What Year? 0 points 0 points  What month? 0 points 0 points  What time? 0 points 0 points  Count back from 20 0 points 0 points  Months in reverse 0 points 0 points  Repeat phrase 0 points 0 points  Total Score 0 points 0 points    Immunizations Immunization History  Administered Date(s) Administered   Moderna Sars-Covid-2 Vaccination 06/14/2019, 07/13/2019    TDAP status: Due, Education has been provided regarding the importance of this vaccine. Advised may receive this vaccine at local pharmacy or Health Dept. Aware to provide a copy of the vaccination record if obtained from local pharmacy or Health Dept. Verbalized acceptance and understanding.  Flu Vaccine status: Due, Education has been provided regarding the importance of this vaccine. Advised may receive this vaccine at local pharmacy or Health Dept. Aware to provide a copy of the vaccination record if obtained from local pharmacy or Health Dept. Verbalized acceptance and understanding.  Pneumococcal vaccine status: Due, Education has been provided regarding the importance of this vaccine. Advised may receive this vaccine at local pharmacy or Health Dept. Aware to provide a copy of the vaccination record if obtained from local pharmacy or Health Dept. Verbalized acceptance and understanding.  Covid-19 vaccine status: Information provided on how to obtain vaccines.   Qualifies for Shingles Vaccine? Yes   Zostavax completed No   Shingrix Completed?: No.    Education has been provided  regarding the importance of this vaccine. Patient has been advised to call insurance company to determine out of pocket expense if they have not yet received this vaccine. Advised may also receive vaccine at local pharmacy or Health Dept. Verbalized acceptance and understanding.  Screening Tests Health Maintenance  Topic Date Due   DTaP/Tdap/Td (1 - Tdap) Never done   Zoster Vaccines- Shingrix (1 of 2) Never done   Medicare Annual Wellness (AWV)  12/21/2022   Pneumonia Vaccine 84+ Years old (1 of 1 - PCV) 10/17/2023 (Originally 11/02/2016)   Colonoscopy  07/22/2023   MAMMOGRAM  02/28/2024   DEXA SCAN  Completed   Hepatitis C Screening  Completed   HPV VACCINES  Aged Out   INFLUENZA VACCINE  Discontinued   COVID-19 Vaccine  Discontinued    Health Maintenance  Health Maintenance Due  Topic Date Due   DTaP/Tdap/Td (1 - Tdap) Never done   Zoster Vaccines- Shingrix (1 of 2) Never done   Medicare Annual Wellness (AWV)  12/21/2022    Colorectal cancer screening: Type of screening: Colonoscopy. Completed 07/2013. Repeat every 10 years  Mammogram status: Completed 02/2022. Repeat every year Patient is currently taking chemo  Bone Density status: Completed 11/2020. Results reflect: Bone density results: OSTEOPENIA. Repeat every 2 years. Patient will talk with her oncologist about this since she has had radiation and taking chemo now.  Lung Cancer Screening: (Low Dose CT Chest recommended if Age 55-80 years, 20 pack-year currently smoking OR have quit w/in 15years.) does qualify. Patient has lung cancer and gets this done every 3 months    Additional Screening:  Hepatitis C Screening: does qualify; Completed 09/2016  Vision Screening: Recommended annual ophthalmology exams for early detection of glaucoma and other disorders of the eye. Is the patient up to date with their annual eye exam?  Yes  Who is the provider or what is the name of the office in which the patient attends annual eye  exams? [facility] If pt is not established with a provider, would they like to be referred to a provider to establish care? No .   Dental Screening: Recommended annual dental exams for proper oral hygiene    Community Resource Referral / Chronic Care Management: CRR required this visit?  No   CCM required this visit?  No     Plan:     I have personally reviewed and noted the following in the patient's chart:   Medical and social history Use of alcohol, tobacco or illicit drugs  Current medications and supplements including opioid prescriptions. Patient is currently taking opioid prescriptions. Information provided to patient regarding non-opioid alternatives. Patient advised to discuss non-opioid treatment plan with their provider. Functional ability and status Nutritional status Physical activity Advanced directives List of other physicians Hospitalizations, surgeries, and ER visits in previous 12 months Vitals Screenings to include cognitive, depression, and falls Referrals and appointments  In addition, I have reviewed and discussed with patient certain preventive protocols, quality metrics, and best practice recommendations. A written personalized care plan for preventive services as well as general preventive health recommendations were provided to patient.     Sydell Axon, LPN   [id number]   After Visit Summary: (MyChart) Due to this being a telephonic visit, the after visit summary with patients personalized plan was offered to patient via MyChart   Nurse Notes: None

## 2023-02-06 ENCOUNTER — Other Ambulatory Visit: Payer: Self-pay | Admitting: *Deleted

## 2023-02-06 ENCOUNTER — Ambulatory Visit
Admission: RE | Admit: 2023-02-06 | Discharge: 2023-02-06 | Disposition: A | Discharge status: Home / Self Care | Payer: PPO | Source: Ambulatory Visit | Attending: Radiation Oncology | Admitting: Radiation Oncology

## 2023-02-06 VITALS — BP 132/89 | HR 105 | Temp 96.6°F | Ht 59.0 in | Wt 138.0 lb

## 2023-02-06 DIAGNOSIS — C3490 Malignant neoplasm of unspecified part of unspecified bronchus or lung: Secondary | ICD-10-CM | POA: Diagnosis present

## 2023-02-06 DIAGNOSIS — M25552 Pain in left hip: Secondary | ICD-10-CM

## 2023-02-06 NOTE — Progress Notes (Signed)
Radiation Oncology Follow up Note  Name: Victoria Foley   Date:   02/06/2023 MRN:  [id number] DOB: Feb 27, 1951    This 72 y.o. female presents to the clinic today for 1 month follow-up status post palliative radiation therapy to her left hip and patient previously treated to her brain as well as right hip for stage IV adenocarcinoma lung.  REFERRING PROVIDER: Dana Allan, MD  HPI: Patient is a 73 year old female now out 1 month having completed palliative radiation therapy to her left hip.  She is doing well states the pain is markedly improved.  She is using a cane for [employer] assistance.  We have also treated partial brain..  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 Barbaraann Cao recommending continued imaging surveillance.  She is currently on [place] Taxol second line therapy for progressive disease.  COMPLICATIONS OF TREATMENT: none  FOLLOW UP COMPLIANCE: keeps appointments   PHYSICAL EXAM:  BP 132/89   Pulse (!) 105   Temp (!) 96.6 F (35.9 C)   Ht 4\' 11"  (1.499 m)   Wt 138 lb (62.6 kg)   BMI 27.87 kg/m  Range of motion her lower extremities does not elicit pain.  Crude visual fields within normal range.  Motor or sensory and DTR levels are equal and symmetric in upper lower extremities.  Lungs are clear to AMP cardiac examination essentially unremarkable.  RADIOLOGY RESULTS: MRI of her brain reviewed compatible with above-stated findings  PLAN: At this time I am turning follow-up care over to Dr. Barbaraann Cao as well as medical oncology.  I agree with her MRI findings and her stable neurologic exam would continue to image her for surveillance.  I would be happy to reevaluate this patient in time should further palliative treatment be indicated.  Patient and family know to call with any concerns.  I would like to take this opportunity to thank you for allowing me to participate in the care of your  patient.Carmina Miller, MD

## 2023-02-07 ENCOUNTER — Other Ambulatory Visit: Payer: Self-pay

## 2023-02-10 ENCOUNTER — Encounter: Payer: Self-pay | Admitting: Internal Medicine

## 2023-02-11 ENCOUNTER — Telehealth: Payer: Self-pay | Admitting: *Deleted

## 2023-02-11 NOTE — Telephone Encounter (Signed)
[facility] called asking for the form they faxed over to be completed and return regarding is opt needs premeds pre tx

## 2023-02-12 MED FILL — Fosaprepitant Dimeglumine For IV Infusion 150 MG (Base Eq): INTRAVENOUS | Qty: 5 | Status: AC

## 2023-02-13 ENCOUNTER — Inpatient Hospital Stay: Payer: PPO

## 2023-02-13 ENCOUNTER — Encounter: Payer: Self-pay | Admitting: Internal Medicine

## 2023-02-13 ENCOUNTER — Inpatient Hospital Stay: Payer: PPO | Admitting: Internal Medicine

## 2023-02-13 ENCOUNTER — Telehealth: Payer: Self-pay

## 2023-02-13 VITALS — BP 128/83 | HR 123 | Temp 98.5°F | Wt 137.0 lb

## 2023-02-13 DIAGNOSIS — C3490 Malignant neoplasm of unspecified part of unspecified bronchus or lung: Secondary | ICD-10-CM

## 2023-02-13 DIAGNOSIS — G620 Drug-induced polyneuropathy: Secondary | ICD-10-CM | POA: Diagnosis not present

## 2023-02-13 DIAGNOSIS — Z5111 Encounter for antineoplastic chemotherapy: Secondary | ICD-10-CM

## 2023-02-13 DIAGNOSIS — C7931 Secondary malignant neoplasm of brain: Secondary | ICD-10-CM

## 2023-02-13 DIAGNOSIS — D6959 Other secondary thrombocytopenia: Secondary | ICD-10-CM

## 2023-02-13 DIAGNOSIS — T451X5A Adverse effect of antineoplastic and immunosuppressive drugs, initial encounter: Secondary | ICD-10-CM

## 2023-02-13 LAB — CMP (CANCER CENTER ONLY)
ALT: 13 U/L (ref 0–44)
AST: 20 U/L (ref 15–41)
Albumin: 3.6 g/dL (ref 3.5–5.0)
Alkaline Phosphatase: 193 U/L — ABNORMAL HIGH (ref 38–126)
Anion gap: 6 (ref 5–15)
BUN: 27 mg/dL — ABNORMAL HIGH (ref 8–23)
CO2: 20 mmol/L — ABNORMAL LOW (ref 22–32)
Calcium: 8.7 mg/dL — ABNORMAL LOW (ref 8.9–10.3)
Chloride: 109 mmol/L (ref 98–111)
Creatinine: 1.33 mg/dL — ABNORMAL HIGH (ref 0.44–1.00)
GFR, Estimated: 43 mL/min — ABNORMAL LOW (ref >60)
Glucose, Bld: 102 mg/dL — ABNORMAL HIGH (ref 70–99)
Potassium: 4 mmol/L (ref 3.5–5.1)
Sodium: 135 mmol/L (ref 135–145)
Total Bilirubin: 0.5 mg/dL (ref 0.3–1.2)
Total Protein: 6.6 g/dL (ref 6.5–8.1)

## 2023-02-13 LAB — CBC WITH DIFFERENTIAL (CANCER CENTER ONLY)
Abs Immature Granulocytes: 0.01 10*3/uL (ref 0.00–0.07)
Basophils Absolute: 0 10*3/uL (ref 0.0–0.1)
Basophils Relative: 1 %
Eosinophils Absolute: 0 10*3/uL (ref 0.0–0.5)
Eosinophils Relative: 2 %
HCT: 27.1 % — ABNORMAL LOW (ref 36.0–46.0)
Hemoglobin: 9.2 g/dL — ABNORMAL LOW (ref 12.0–15.0)
Immature Granulocytes: 1 %
Lymphocytes Relative: 36 %
Lymphs Abs: 0.7 10*3/uL (ref 0.7–4.0)
MCH: 35.7 pg — ABNORMAL HIGH (ref 26.0–34.0)
MCHC: 33.9 g/dL (ref 30.0–36.0)
MCV: 105 fL — ABNORMAL HIGH (ref 80.0–100.0)
Monocytes Absolute: 0.3 10*3/uL (ref 0.1–1.0)
Monocytes Relative: 16 %
Neutro Abs: 0.9 10*3/uL — ABNORMAL LOW (ref 1.7–7.7)
Neutrophils Relative %: 44 %
Platelet Count: 55 10*3/uL — ABNORMAL LOW (ref 150–400)
RBC: 2.58 MIL/uL — ABNORMAL LOW (ref 3.87–5.11)
RDW: 14 % (ref 11.5–15.5)
WBC Count: 1.9 10*3/uL — ABNORMAL LOW (ref 4.0–10.5)
nRBC: 0 % (ref 0.0–0.2)

## 2023-02-13 MED ORDER — HEPARIN SOD (PORK) LOCK FLUSH 100 UNIT/ML IV SOLN
500.0000 [IU] | Freq: Once | INTRAVENOUS | Status: AC
  Administered 2023-02-13: 500 [IU] via INTRAVENOUS
  Filled 2023-02-13: qty 5

## 2023-02-13 MED ORDER — GABAPENTIN 100 MG PO CAPS: 100.0000 mg | ORAL_CAPSULE | Freq: Three times a day (TID) | ORAL | 0 refills | Status: DC

## 2023-02-13 NOTE — Progress Notes (Signed)
[facility] OFFICE PROGRESS NOTE  Patient Care Team: Dana Allan, MD as PCP - General (Family Medicine) Glory Buff, RN as Oncology Nurse Navigator Michaelyn Barter, MD as Consulting Physician (Oncology) Sondra Come, MD as Consulting Physician (Urology)  TREATMENT:  Right hip palliative RT 30 cGy completed 12/18/2021 Carboplatin, alimta and Keytruda x 4 cycles completed 02/19/22.  Discontinued maintenance alimta (04-23-22) due to worsening renal dysfunction, transaminitis and cytopenias Keytruda maintenance-discontinued on 09/17/2022 due to disease progression Palliative RT to left frontal lesion 5 fx completed 09/27/22 Palliative RT to left hip completed September 2024 Carboplatin (AUC 4) and paclitaxel 150 mg/m2 - started 10/07/2022   ASSESSMENT & PLAN:  # Primary lung adenocarcinoma (HCC), Stage IV, PDL1 1% -Progressed through first-line [place] Alimta, Keytruda and then [place] maintenance on 09/17/2022.  -Started on second line carbo AUC 4 and Taxol 150 mg/m on 10/07/2022.  CT scan after 3 cycles showed stable to mildly improved disease.  Labs reviewed today.  WBC 1.9, ANC 0.9, hemoglobin 9.2 and platelets of 55.  Will hold cycle 6 of CarboTaxol today.  Also reports persistent peripheral neuropathy now.  Started on gabapentin.  Scheduled for another scan on November 4.  Will hold treatment until then.  She is at risk of worsening toxicity with further chemotherapy.  I will follow-up with her after repeat scan in 2 weeks.  She remains unsure about doing maintenance Avastin versus surveillance.  Will finalize after the scan.  Regarding bladder wall thickening, patient was seen by Dr. Richardo Hanks suspicion for malignant process is low.  Follow-up after repeat CT.  -Liquid foundation 1 CDX from June 2024 with no targetable mutation.  Repeat lung biopsy on 12/17/2022 sent for foundation 1 CDX testing was limited due to inadequate sample but no targets seen.    -RNA fusion panel on  foundation one is pending.   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   -Follow-up with Dr. Barbaraann Cao.  Repeat brain MRI from 01/03/2023 - Slight contraction of the focus of enhancement at the inferiorleft temporal lobe occipital lobe junction. No worsening or progressive finding related to that. Again, the patient is seen to have extensive metastatic disease in the left frontal calvarium with adjacent dural thickening and enhancement. No evidence of convincing involvement of the adjacent brain today. Overall size is stable with maximal right-to-left dimension of 4.5 cm as measured on the T2 axial imaging.  # Chemotherapy induced thrombocytopenia -Continue to monitor  # Chemotherapy induced peripheral neuropathy -Worsening.  Started on gabapentin 100 mg 3 times daily, taper as tolerated.  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 Cr stabilized.  -will continue to monitor.  # Chemotherapy-induced anemia  -Continue with Aranesp 300 mcg as needed to maintain hemoglobin between 9-10.  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 Aggie Cosier on 12/18/2021 -Completed palliative RT to left hip on 01/01/2023.  Pain has improved.  Taking tramadol 50 mg twice a day. -Was seen by [facility].  No surgical intervention needed.  # Metastatic bone lesions -With the stabilization of creatinine, will plan to start her on Xgeva.   -Advised to hold off on dental work until repeat labs on November 7.  Her counts are low right now and increased risk of infection.  # Access - port  RTC in 2 weeks for MD visit to discuss scans.  Orders Placed This  Encounter  Procedures   CBC with Differential/Platelet    Standing Status:   Future    Standing Expiration Date:   02/13/2024   Comprehensive metabolic panel    Standing Status:   Future    Standing  Expiration Date:   02/13/2024    [facility]- 12/06/2021   All questions were answered. The patient knows to call the clinic with any problems, questions or concerns. The total time spent in the appointment was 30 minutes encounter with patients including review of chart and various tests results, discussions about plan of care and coordination of care plan   Michaelyn Barter, MD 02/13/2023 11:55 AM  INTERVAL HISTORY: Please see below for problem oriented charting.  Patient following with [place] for treatment of stage IV lung adenocarcinoma.   Patient was seen today accompanied with daughter prior to cycle 6 of [place] and Taxol. Patient reports worsening peripheral neuropathy in her bilateral hands and feet.  Also reports itching mainly on her thigh.  Has been using Claritin.  We discussed about adding Benadryl at nighttime.  If that does not help, will consider steroid taper.  Appetite fluctuates.  Reports good today.  Energy level fair.  Her pain is controlled with tramadol.  REVIEW OF SYSTEMS:   Positive ROS as above.  Rest 10 points ROS negative   I have reviewed the past medical history, past surgical history, social history and family history with the patient and they are unchanged from previous note.  ALLERGIES:  is allergic to diclofenac, lisinopril, and sulfa antibiotics.  MEDICATIONS:  Current Outpatient Medications  Medication Sig Dispense Refill   albuterol (VENTOLIN HFA) 108 (90 Base) MCG/ACT inhaler Inhale 2 puffs into the lungs every 6 (six) hours as needed for wheezing or shortness of breath. 18 g 0   Azelastine HCl 137 MCG/SPRAY SOLN Place 2 sprays into both nostrils 2 (two) times daily.     Cholecalciferol (VITAMIN D-3 PO) Take by mouth.     dexamethasone (DECADRON) 4 MG tablet Take 1 tablet (4 mg total) by mouth daily. Take for 3 days after chemotherapy 30 tablet 0   fluticasone (FLONASE) 50 MCG/ACT nasal spray USE TWO SPRAYS IN EACH NOSTRIL ONCE DAILY prn 16 g 12    gabapentin (NEURONTIN) 100 MG capsule Take 1 capsule (100 mg total) by mouth 3 (three) times daily. 90 capsule 0   loratadine (CLARITIN) 10 MG tablet TAKE ONE TABLET BY MOUTH DAILY AS NEEDED FOR ALLERGIES 90 tablet 3   losartan (COZAAR) 100 MG tablet Take 1 tablet (100 mg total) by mouth daily. 90 tablet 3   ondansetron (ZOFRAN) 8 MG tablet Take 1 tablet (8 mg total) by mouth every 8 (eight) hours as needed for nausea or vomiting. 30 tablet 3   traMADol (ULTRAM) 50 MG tablet TAKE 1 TABLET BY MOUTH EVERY 6 HOURS AS NEEDED 60 tablet 0   magic mouthwash w/lidocaine SOLN Take 5 mLs by mouth 4 (four) times daily as needed for mouth pain. Sig: Swish/Swallow 5-10 ml four times a day as needed. Dispense 480 ml. 1RF (Patient not taking: Reported on 02/05/2023) 480 mL 1   MULTIPLE VITAMIN PO Take 1 tablet by mouth daily. (Patient not taking: Reported on 02/05/2023)     valACYclovir (VALTREX) 500 MG tablet Take 1 tablet (500 mg total) by mouth 2 (two) times daily. For shingles prophylaxis (Patient not taking: Reported on 02/05/2023) 60 tablet 1   No current facility-administered medications for this visit.   Facility-Administered Medications Ordered in  Other Visits  Medication Dose Route Frequency Provider Last Rate Last Admin   heparin lock flush 100 UNIT/ML injection            heparin lock flush 100 unit/mL  500 Units Intravenous Once Michaelyn Barter, MD       sodium chloride flush (NS) 0.9 % injection 10 mL  10 mL Intravenous Once Michaelyn Barter, MD        SUMMARY OF ONCOLOGIC HISTORY: Oncology History  Primary lung adenocarcinoma (HCC)  10/10/2021 Initial Diagnosis   Primary lung adenocarcinoma (HCC) Stage IV  Patient seen by PCP for wheezing for 2 weeks. Imaging with CXR followed by CT chest showed Spiculated 2.1 x 2.2 cm left upper lobe pulmonary nodule with associated pleural tethering. Innumerable subcentimeter pulmonary nodules throughout the lungs.      11/14/2021 PET scan    IMPRESSION: 1. Left suprahilar nodule with maximum SUV 4.5, and a more distal 2.2 cm left upper lobe nodule with maximum SUV of 5.5. 2. Innumerable scattered small pulmonary nodules, most of which are under 5 mm in diameter, throughout both lungs. Some are cavitary. Possibilities include cavitating hematogenous disseminated malignancy versus infectious etiology subset as septic emboli. Most of the nodules are stable, some are minimally enlarged compared to 10/10/2021. 3. Abnormal mild permeative type bony findings in the right hemipelvis associated with substantially accentuated metabolic activity. Possible associated pathologic fracture through the right quadrilateral plate and right acetabulum. The findings involve the right ilium and ischium but also the right lateral sacral ala and a small focus in the left sacrum.    Pathology Results   She had transbronchial biopsies by Dr. Jayme Cloud on 11/19/21.  Pathology from LUL nodule showed adenocarcinoma, consistent with lung primary.    11/19/2021 Cancer Staging   Staging form: Lung, AJCC 8th Edition - Clinical stage from 11/19/2021: Stage IVB (cT1c, cM1c) - Signed by Michaelyn Barter, MD on 11/27/2021 Stage prefix: Initial diagnosis   11/27/2021 Imaging   MRI pelvis  IMPRESSION: 1. Abnormal marrow signal throughout the right ilium involving the right acetabulum, right superior pubic ramus and right inferior pubic ramus, right side of the sacrum and a smaller focus of abnormal marrow lesion in the left side of the sacrum. Findings are concerning for metastatic disease. 2. Nondisplaced pathologic fracture of the quadrilateral plate of the right acetabulum. 3. Mild muscle edema in the right gluteus minimus and medius muscles likely reflecting mild muscle strain.  MRI brain  IMPRESSION: No evidence of intracranial metastatic disease.   Indeterminate 1 cm left frontal calvarium lesion.   12/18/2021 - 08/27/2022 Chemotherapy   Patient is on  Treatment Plan : LUNG Carboplatin (5) + Pemetrexed (500) + Pembrolizumab (200) D1 q21d Induction x 4 cycles / Maintenance Pemetrexed (500) + Pembrolizumab (200) D1 q21d     10/07/2022 -  Chemotherapy   Patient is on Treatment Plan : LUNG Carboplatin + Paclitaxel q21d Dose Reduction     Primary malignant neoplasm of left upper lobe of lung (HCC)  06/04/2022 Initial Diagnosis   Primary malignant neoplasm of left upper lobe of lung (HCC)   06/04/2022 Cancer Staging   Staging form: Lung, AJCC 8th Edition - Pathologic: Stage IVB (pT1c, pNX, cM1c) - Signed by Earna Coder, MD on 06/04/2022     PHYSICAL EXAMINATION: ECOG PERFORMANCE STATUS: 2 - Symptomatic, <50% confined to bed  Vitals:   02/13/23 0913  BP: 128/83  Pulse: (!) 123  Temp: 98.5 F (36.9 C)  SpO2: 100%  Filed Weights   02/13/23 0913  Weight: 137 lb (62.1 kg)     Physical Exam Constitutional:      Appearance: Normal appearance.  HENT:     Head: Normocephalic and atraumatic.  Cardiovascular:     Rate and Rhythm: Normal rate.  Pulmonary:     Effort: Pulmonary effort is normal.  Musculoskeletal:     Right lower leg: No edema.     Left lower leg: No edema.  Skin:    General: Skin is warm.  Neurological:     General: No focal deficit present.     Mental Status: She is oriented to person, place, and time.  Psychiatric:        Mood and Affect: Mood normal.     LABORATORY DATA:  I have reviewed the data as listed    Component Value Date/Time   NA 135 02/13/2023 0859   K 4.0 02/13/2023 0859   CL 109 02/13/2023 0859   CO2 20 (L) 02/13/2023 0859   GLUCOSE 102 (H) 02/13/2023 0859   BUN 27 (H) 02/13/2023 0859   CREATININE 1.33 (H) 02/13/2023 0859   CALCIUM 8.7 (L) 02/13/2023 0859   PROT 6.6 02/13/2023 0859   ALBUMIN 3.6 02/13/2023 0859   AST 20 02/13/2023 0859   ALT 13 02/13/2023 0859   ALKPHOS 193 (H) 02/13/2023 0859   BILITOT 0.5 02/13/2023 0859   GFRNONAA 43 (L) 02/13/2023 0859    No  results found for: "SPEP", "UPEP"  Lab Results  Component Value Date   WBC 1.9 (L) 02/13/2023   NEUTROABS 0.9 (L) 02/13/2023   HGB 9.2 (L) 02/13/2023   HCT 27.1 (L) 02/13/2023   MCV 105.0 (H) 02/13/2023   PLT 55 (L) 02/13/2023      Chemistry      Component Value Date/Time   NA 135 02/13/2023 0859   K 4.0 02/13/2023 0859   CL 109 02/13/2023 0859   CO2 20 (L) 02/13/2023 0859   BUN 27 (H) 02/13/2023 0859   CREATININE 1.33 (H) 02/13/2023 0859      Component Value Date/Time   CALCIUM 8.7 (L) 02/13/2023 0859   ALKPHOS 193 (H) 02/13/2023 0859   AST 20 02/13/2023 0859   ALT 13 02/13/2023 0859   BILITOT 0.5 02/13/2023 0859       RADIOGRAPHIC STUDIES: I have personally reviewed the radiological images as listed and agreed with the findings in the report. No results found.

## 2023-02-13 NOTE — Telephone Encounter (Signed)
Faxed over the physical therapy referral to [facility] in [place] today.

## 2023-02-13 NOTE — Progress Notes (Signed)
Patient wants to know about her physical therapy referral.

## 2023-02-19 ENCOUNTER — Encounter: Payer: Self-pay | Admitting: Internal Medicine

## 2023-02-24 ENCOUNTER — Ambulatory Visit
Admission: RE | Admit: 2023-02-24 | Discharge: 2023-02-24 | Disposition: A | Discharge status: Home / Self Care | Payer: PPO | Source: Ambulatory Visit | Attending: Internal Medicine | Admitting: Internal Medicine

## 2023-02-24 ENCOUNTER — Other Ambulatory Visit: Payer: Self-pay | Admitting: Hospice and Palliative Medicine

## 2023-02-24 DIAGNOSIS — C3490 Malignant neoplasm of unspecified part of unspecified bronchus or lung: Secondary | ICD-10-CM | POA: Insufficient documentation

## 2023-02-25 ENCOUNTER — Telehealth: Payer: Self-pay

## 2023-02-25 NOTE — Telephone Encounter (Signed)
Following up on referral sent for hip pain. Patient scheduled on 11/18

## 2023-02-27 ENCOUNTER — Inpatient Hospital Stay: Payer: PPO | Attending: Internal Medicine

## 2023-02-27 ENCOUNTER — Inpatient Hospital Stay (HOSPITAL_BASED_OUTPATIENT_CLINIC_OR_DEPARTMENT_OTHER): Payer: PPO | Admitting: Internal Medicine

## 2023-02-27 ENCOUNTER — Encounter: Payer: Self-pay | Admitting: Internal Medicine

## 2023-02-27 VITALS — BP 110/76 | HR 132 | Temp 97.0°F | Wt 137.0 lb

## 2023-02-27 DIAGNOSIS — C7951 Secondary malignant neoplasm of bone: Secondary | ICD-10-CM | POA: Diagnosis present

## 2023-02-27 DIAGNOSIS — R7401 Elevation of levels of liver transaminase levels: Secondary | ICD-10-CM | POA: Diagnosis not present

## 2023-02-27 DIAGNOSIS — Z79899 Other long term (current) drug therapy: Secondary | ICD-10-CM | POA: Diagnosis not present

## 2023-02-27 DIAGNOSIS — T451X5A Adverse effect of antineoplastic and immunosuppressive drugs, initial encounter: Secondary | ICD-10-CM | POA: Diagnosis not present

## 2023-02-27 DIAGNOSIS — J432 Centrilobular emphysema: Secondary | ICD-10-CM | POA: Diagnosis not present

## 2023-02-27 DIAGNOSIS — D6481 Anemia due to antineoplastic chemotherapy: Secondary | ICD-10-CM

## 2023-02-27 DIAGNOSIS — C7931 Secondary malignant neoplasm of brain: Secondary | ICD-10-CM | POA: Insufficient documentation

## 2023-02-27 DIAGNOSIS — I700 Atherosclerosis of aorta: Secondary | ICD-10-CM | POA: Insufficient documentation

## 2023-02-27 DIAGNOSIS — D709 Neutropenia, unspecified: Secondary | ICD-10-CM | POA: Insufficient documentation

## 2023-02-27 DIAGNOSIS — C3490 Malignant neoplasm of unspecified part of unspecified bronchus or lung: Secondary | ICD-10-CM

## 2023-02-27 DIAGNOSIS — Z7952 Long term (current) use of systemic steroids: Secondary | ICD-10-CM | POA: Diagnosis not present

## 2023-02-27 DIAGNOSIS — R609 Edema, unspecified: Secondary | ICD-10-CM | POA: Diagnosis not present

## 2023-02-27 DIAGNOSIS — N189 Chronic kidney disease, unspecified: Secondary | ICD-10-CM | POA: Insufficient documentation

## 2023-02-27 DIAGNOSIS — D6959 Other secondary thrombocytopenia: Secondary | ICD-10-CM | POA: Insufficient documentation

## 2023-02-27 DIAGNOSIS — C3412 Malignant neoplasm of upper lobe, left bronchus or lung: Secondary | ICD-10-CM | POA: Insufficient documentation

## 2023-02-27 DIAGNOSIS — G620 Drug-induced polyneuropathy: Secondary | ICD-10-CM | POA: Diagnosis not present

## 2023-02-27 DIAGNOSIS — N289 Disorder of kidney and ureter, unspecified: Secondary | ICD-10-CM | POA: Diagnosis not present

## 2023-02-27 DIAGNOSIS — K8020 Calculus of gallbladder without cholecystitis without obstruction: Secondary | ICD-10-CM | POA: Insufficient documentation

## 2023-02-27 DIAGNOSIS — D701 Agranulocytosis secondary to cancer chemotherapy: Secondary | ICD-10-CM

## 2023-02-27 LAB — COMPREHENSIVE METABOLIC PANEL
ALT: 11 U/L (ref 0–44)
AST: 20 U/L (ref 15–41)
Albumin: 3.5 g/dL (ref 3.5–5.0)
Alkaline Phosphatase: 198 U/L — ABNORMAL HIGH (ref 38–126)
Anion gap: 6 (ref 5–15)
BUN: 21 mg/dL (ref 8–23)
CO2: 24 mmol/L (ref 22–32)
Calcium: 9.1 mg/dL (ref 8.9–10.3)
Chloride: 108 mmol/L (ref 98–111)
Creatinine, Ser: 1.29 mg/dL — ABNORMAL HIGH (ref 0.44–1.00)
GFR, Estimated: 44 mL/min — ABNORMAL LOW (ref >60)
Glucose, Bld: 121 mg/dL — ABNORMAL HIGH (ref 70–99)
Potassium: 4 mmol/L (ref 3.5–5.1)
Sodium: 138 mmol/L (ref 135–145)
Total Bilirubin: 0.6 mg/dL (ref <1.2)
Total Protein: 6.9 g/dL (ref 6.5–8.1)

## 2023-02-27 LAB — CBC WITH DIFFERENTIAL/PLATELET
Abs Immature Granulocytes: 0 10*3/uL (ref 0.00–0.07)
Basophils Absolute: 0 10*3/uL (ref 0.0–0.1)
Basophils Relative: 0 %
Eosinophils Absolute: 0 10*3/uL (ref 0.0–0.5)
Eosinophils Relative: 2 %
HCT: 28 % — ABNORMAL LOW (ref 36.0–46.0)
Hemoglobin: 9.5 g/dL — ABNORMAL LOW (ref 12.0–15.0)
Immature Granulocytes: 0 %
Lymphocytes Relative: 33 %
Lymphs Abs: 0.6 10*3/uL — ABNORMAL LOW (ref 0.7–4.0)
MCH: 35.7 pg — ABNORMAL HIGH (ref 26.0–34.0)
MCHC: 33.9 g/dL (ref 30.0–36.0)
MCV: 105.3 fL — ABNORMAL HIGH (ref 80.0–100.0)
Monocytes Absolute: 0.2 10*3/uL (ref 0.1–1.0)
Monocytes Relative: 11 %
Neutro Abs: 1 10*3/uL — ABNORMAL LOW (ref 1.7–7.7)
Neutrophils Relative %: 54 %
Platelets: 92 10*3/uL — ABNORMAL LOW (ref 150–400)
RBC: 2.66 MIL/uL — ABNORMAL LOW (ref 3.87–5.11)
RDW: 13.4 % (ref 11.5–15.5)
WBC: 1.9 10*3/uL — ABNORMAL LOW (ref 4.0–10.5)
nRBC: 0 % (ref 0.0–0.2)

## 2023-02-27 MED ORDER — SODIUM CHLORIDE 0.9% FLUSH
10.0000 mL | Freq: Once | INTRAVENOUS | Status: AC
  Administered 2023-02-27: 10 mL via INTRAVENOUS
  Filled 2023-02-27: qty 10

## 2023-02-27 MED ORDER — HEPARIN SOD (PORK) LOCK FLUSH 100 UNIT/ML IV SOLN
500.0000 [IU] | Freq: Once | INTRAVENOUS | Status: AC
Start: 2023-02-27 — End: 2023-02-27
  Administered 2023-02-27: 500 [IU] via INTRAVENOUS
  Filled 2023-02-27: qty 5

## 2023-02-27 NOTE — Progress Notes (Signed)
Patient says that around the 20th of October her right shoulder started hurting it just comes and goes, radiates down to her right arm. She needs a refill on her tramadol. Her CT Scan from 02/24/2023 is ready.

## 2023-02-27 NOTE — Progress Notes (Signed)
[facility] OFFICE PROGRESS NOTE  Patient Care Team: Dana Allan, MD as PCP - General (Family Medicine) Glory Buff, RN as Oncology Nurse Navigator Michaelyn Barter, MD as Consulting Physician (Oncology) Sondra Come, MD as Consulting Physician (Urology)  TREATMENT:  Right hip palliative RT 30 cGy completed 12/18/2021 Carboplatin, alimta and Keytruda x 4 cycles completed 02/19/22.  Discontinued maintenance alimta (04-23-22) due to worsening renal dysfunction, transaminitis and cytopenias Keytruda maintenance-discontinued on 09/17/2022 due to disease progression Palliative RT to left frontal lesion 5 fx completed 09/27/22 Palliative RT to left hip completed September 2024 Carboplatin (AUC 4) and paclitaxel 150 mg/m2 - started 10/07/2022- 01/21/2023.   ASSESSMENT & PLAN:  # Primary lung adenocarcinoma (HCC), Stage IV, PDL1 1% -Progressed through first-line [place] Alimta, Keytruda and then [place] maintenance on 09/17/2022.  -Started on second line carbo AUC 4 and Taxol 150 mg/m on 10/07/2022.  Discontinued on 01/21/2023 after 5 cycles due to cytopenias and peripheral neuropathy.  CT chest abdomen pelvis from 02/24/2023 shows decrease in the size of left upper lobe nodule from 3 x 2.6 cm to 1.9 x 1.3 cm. stable osseous metastatic disease.  Patient was initially hesitant to add Avastin but now agreeable to do maintenance.  Her counts are still recovering slowly and she has been feeling tired.  Will consider a month of treatment break.  Plan to start on Avastin maintenance in 4 weeks with Xgeva.  Patient will continue to follow with Dr. Kemper Durie at [facility] via virtual visits.  -Liquid foundation 1 CDX from June 2024 with no targetable mutation.  Repeat lung biopsy on 12/17/2022 sent for foundation 1 CDX testing was limited due to inadequate sample but no targets seen.    -RNA fusion panel on foundation one is pending.   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   -Follow-up with Dr. Barbaraann Cao.  Repeat brain MRI from 01/03/2023 - Slight contraction of the focus of enhancement at the inferiorleft temporal lobe occipital lobe junction. No worsening or progressive finding related to that. Again, the patient is seen to have extensive metastatic disease in the left frontal calvarium with adjacent dural thickening and enhancement. No evidence of convincing involvement of the adjacent brain today. Overall size is stable with maximal right-to-left dimension of 4.5 cm as measured on the T2 axial imaging.  -Next MRI brain is scheduled for January 2024.  # Chemotherapy induced thrombocytopenia -Continue to monitor  # Chemotherapy induced peripheral neuropathy -Worsening.  Started on gabapentin 100 mg 3 times daily, taper as tolerated.  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 Cr stabilized.  -will continue to monitor.  # Chemotherapy-induced anemia  -Aranesp 300 mcg as needed.   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 Aggie Cosier on 12/18/2021 -Completed palliative RT to left hip on 01/01/2023.  Pain has improved.  Taking tramadol 50 mg twice a day. -Was seen by [facility].  No surgical intervention needed.  # Metastatic bone lesions -Plan to start Xgeva in 1 month.  She was advised dental cleaning prior to clearance.  I have advised her to wait for couple more weeks prior to scheduling any dental procedure.  Will reach out to her and I would like to get repeat lab work before she has that done.  # Access - port  RTC in 2 weeks for MD visit to discuss scans.  Orders  Placed This Encounter  Procedures   Consent Attestation for Oncology Treatment    Order Specific Question:   The patient is informed of risks, benefits, side-effects of the prescribed oncology treatment. Potential short term and long term side effects and  response rates discussed. After a long discussion, the patient made informed decision to proceed.    Answer:   Yes   CBC with Differential (Cancer Center Only)    Standing Status:   Future    Standing Expiration Date:   03/26/2024   Total Protein, Urine dipstick    Standing Status:   Future    Standing Expiration Date:   03/26/2024   PHYSICIAN COMMUNICATION ORDER    UA Protein, dipstick required prior to every 3rd cycle bevacizumab    [facility]- 12/06/2021   All questions were answered. The patient knows to call the clinic with any problems, questions or concerns. The total time spent in the appointment was 30 minutes encounter with patients including review of chart and various tests results, discussions about plan of care and coordination of care plan   Michaelyn Barter, MD 02/27/2023 4:32 PM  INTERVAL HISTORY: Please see below for problem oriented charting.  Patient following with [place] for treatment of stage IV lung adenocarcinoma.   Patient was seen today accompanied with daughter prior to cycle 6 of [place] and Taxol. Patient reports worsening peripheral neuropathy in her bilateral hands and feet.  Also reports itching mainly on her thigh.  Has been using Claritin.  We discussed about adding Benadryl at nighttime.  If that does not help, will consider steroid taper.  Appetite fluctuates.  Reports good today.  Energy level fair.  Her pain is controlled with tramadol.  REVIEW OF SYSTEMS:   Positive ROS as above.  Rest 10 points ROS negative   I have reviewed the past medical history, past surgical history, social history and family history with the patient and they are unchanged from previous note.  ALLERGIES:  is allergic to diclofenac, lisinopril, and sulfa antibiotics.  MEDICATIONS:  Current Outpatient Medications  Medication Sig Dispense Refill   albuterol (VENTOLIN HFA) 108 (90 Base) MCG/ACT inhaler Inhale 2 puffs into the lungs every 6 (six) hours as needed for wheezing or  shortness of breath. 18 g 0   Azelastine HCl 137 MCG/SPRAY SOLN Place 2 sprays into both nostrils 2 (two) times daily.     Cholecalciferol (VITAMIN D-3 PO) Take by mouth.     dexamethasone (DECADRON) 4 MG tablet Take 1 tablet (4 mg total) by mouth daily. Take for 3 days after chemotherapy 30 tablet 0   fluticasone (FLONASE) 50 MCG/ACT nasal spray USE TWO SPRAYS IN EACH NOSTRIL ONCE DAILY prn 16 g 12   gabapentin (NEURONTIN) 100 MG capsule Take 1 capsule (100 mg total) by mouth 3 (three) times daily. 90 capsule 0   loratadine (CLARITIN) 10 MG tablet TAKE ONE TABLET BY MOUTH DAILY AS NEEDED FOR ALLERGIES 90 tablet 3   losartan (COZAAR) 100 MG tablet Take 1 tablet (100 mg total) by mouth daily. 90 tablet 3   ondansetron (ZOFRAN) 8 MG tablet Take 1 tablet (8 mg total) by mouth every 8 (eight) hours as needed for nausea or vomiting. 30 tablet 3   traMADol (ULTRAM) 50 MG tablet TAKE 1 TABLET BY MOUTH EVERY 6 HOURS AS NEEDED 60 tablet 0   magic mouthwash w/lidocaine SOLN Take 5 mLs by mouth 4 (four) times daily as needed for mouth pain. Sig: Swish/Swallow 5-10 ml four times  a day as needed. Dispense 480 ml. 1RF (Patient not taking: Reported on 02/27/2023) 480 mL 1   MULTIPLE VITAMIN PO Take 1 tablet by mouth daily. (Patient not taking: Reported on 02/05/2023)     valACYclovir (VALTREX) 500 MG tablet Take 1 tablet (500 mg total) by mouth 2 (two) times daily. For shingles prophylaxis (Patient not taking: Reported on 02/05/2023) 60 tablet 1   No current facility-administered medications for this visit.   Facility-Administered Medications Ordered in Other Visits  Medication Dose Route Frequency Provider Last Rate Last Admin   heparin lock flush 100 UNIT/ML injection            heparin lock flush 100 unit/mL  500 Units Intravenous Once Michaelyn Barter, MD       sodium chloride flush (NS) 0.9 % injection 10 mL  10 mL Intravenous Once Michaelyn Barter, MD        SUMMARY OF ONCOLOGIC HISTORY: Oncology History   Primary lung adenocarcinoma (HCC)  10/10/2021 Initial Diagnosis   Primary lung adenocarcinoma (HCC) Stage IV  Patient seen by PCP for wheezing for 2 weeks. Imaging with CXR followed by CT chest showed Spiculated 2.1 x 2.2 cm left upper lobe pulmonary nodule with associated pleural tethering. Innumerable subcentimeter pulmonary nodules throughout the lungs.      11/14/2021 PET scan   IMPRESSION: 1. Left suprahilar nodule with maximum SUV 4.5, and a more distal 2.2 cm left upper lobe nodule with maximum SUV of 5.5. 2. Innumerable scattered small pulmonary nodules, most of which are under 5 mm in diameter, throughout both lungs. Some are cavitary. Possibilities include cavitating hematogenous disseminated malignancy versus infectious etiology subset as septic emboli. Most of the nodules are stable, some are minimally enlarged compared to 10/10/2021. 3. Abnormal mild permeative type bony findings in the right hemipelvis associated with substantially accentuated metabolic activity. Possible associated pathologic fracture through the right quadrilateral plate and right acetabulum. The findings involve the right ilium and ischium but also the right lateral sacral ala and a small focus in the left sacrum.    Pathology Results   She had transbronchial biopsies by Dr. Jayme Cloud on 11/19/21.  Pathology from LUL nodule showed adenocarcinoma, consistent with lung primary.    11/19/2021 Cancer Staging   Staging form: Lung, AJCC 8th Edition - Clinical stage from 11/19/2021: Stage IVB (cT1c, cM1c) - Signed by Michaelyn Barter, MD on 11/27/2021 Stage prefix: Initial diagnosis   11/27/2021 Imaging   MRI pelvis  IMPRESSION: 1. Abnormal marrow signal throughout the right ilium involving the right acetabulum, right superior pubic ramus and right inferior pubic ramus, right side of the sacrum and a smaller focus of abnormal marrow lesion in the left side of the sacrum. Findings are concerning for metastatic  disease. 2. Nondisplaced pathologic fracture of the quadrilateral plate of the right acetabulum. 3. Mild muscle edema in the right gluteus minimus and medius muscles likely reflecting mild muscle strain.  MRI brain  IMPRESSION: No evidence of intracranial metastatic disease.   Indeterminate 1 cm left frontal calvarium lesion.   12/18/2021 - 08/27/2022 Chemotherapy   Patient is on Treatment Plan : LUNG Carboplatin (5) + Pemetrexed (500) + Pembrolizumab (200) D1 q21d Induction x 4 cycles / Maintenance Pemetrexed (500) + Pembrolizumab (200) D1 q21d     10/07/2022 - 01/21/2023 Chemotherapy   Patient is on Treatment Plan : LUNG Carboplatin + Paclitaxel q21d Dose Reduction     03/27/2023 -  Chemotherapy   Patient is on Treatment Plan : LUNG Bevacizumab  q21d     Primary malignant neoplasm of left upper lobe of lung (HCC)  06/04/2022 Initial Diagnosis   Primary malignant neoplasm of left upper lobe of lung (HCC)   06/04/2022 Cancer Staging   Staging form: Lung, AJCC 8th Edition - Pathologic: Stage IVB (pT1c, pNX, cM1c) - Signed by Earna Coder, MD on 06/04/2022     PHYSICAL EXAMINATION: ECOG PERFORMANCE STATUS: 2 - Symptomatic, <50% confined to bed  Vitals:   02/27/23 1055  BP: 110/76  Pulse: (!) 132  Temp: (!) 97 F (36.1 C)    Filed Weights   02/27/23 1055  Weight: 137 lb (62.1 kg)     Physical Exam Constitutional:      Appearance: Normal appearance.  HENT:     Head: Normocephalic and atraumatic.  Cardiovascular:     Rate and Rhythm: Normal rate.  Pulmonary:     Effort: Pulmonary effort is normal.  Musculoskeletal:     Right lower leg: No edema.     Left lower leg: No edema.  Skin:    General: Skin is warm.  Neurological:     General: No focal deficit present.     Mental Status: She is oriented to person, place, and time.  Psychiatric:        Mood and Affect: Mood normal.     LABORATORY DATA:  I have reviewed the data as listed    Component Value  Date/Time   NA 138 02/27/2023 1029   K 4.0 02/27/2023 1029   CL 108 02/27/2023 1029   CO2 24 02/27/2023 1029   GLUCOSE 121 (H) 02/27/2023 1029   BUN 21 02/27/2023 1029   CREATININE 1.29 (H) 02/27/2023 1029   CREATININE 1.33 (H) 02/13/2023 0859   CALCIUM 9.1 02/27/2023 1029   PROT 6.9 02/27/2023 1029   ALBUMIN 3.5 02/27/2023 1029   AST 20 02/27/2023 1029   AST 20 02/13/2023 0859   ALT 11 02/27/2023 1029   ALT 13 02/13/2023 0859   ALKPHOS 198 (H) 02/27/2023 1029   BILITOT 0.6 02/27/2023 1029   BILITOT 0.5 02/13/2023 0859   GFRNONAA 44 (L) 02/27/2023 1029   GFRNONAA 43 (L) 02/13/2023 0859    No results found for: "SPEP", "UPEP"  Lab Results  Component Value Date   WBC 1.9 (L) 02/27/2023   NEUTROABS 1.0 (L) 02/27/2023   HGB 9.5 (L) 02/27/2023   HCT 28.0 (L) 02/27/2023   MCV 105.3 (H) 02/27/2023   PLT 92 (L) 02/27/2023      Chemistry      Component Value Date/Time   NA 138 02/27/2023 1029   K 4.0 02/27/2023 1029   CL 108 02/27/2023 1029   CO2 24 02/27/2023 1029   BUN 21 02/27/2023 1029   CREATININE 1.29 (H) 02/27/2023 1029   CREATININE 1.33 (H) 02/13/2023 0859      Component Value Date/Time   CALCIUM 9.1 02/27/2023 1029   ALKPHOS 198 (H) 02/27/2023 1029   AST 20 02/27/2023 1029   AST 20 02/13/2023 0859   ALT 11 02/27/2023 1029   ALT 13 02/13/2023 0859   BILITOT 0.6 02/27/2023 1029   BILITOT 0.5 02/13/2023 0859       RADIOGRAPHIC STUDIES: I have personally reviewed the radiological images as listed and agreed with the findings in the report. CT CHEST ABDOMEN PELVIS WO CONTRAST  Result Date: 02/26/2023 CLINICAL DATA:  Restaging stage IV lung cancer (adenocarcinoma) with progression through maintenance treatment. * Tracking Code: BO * EXAM: CT CHEST, ABDOMEN AND PELVIS  WITHOUT CONTRAST TECHNIQUE: Multidetector CT imaging of the chest, abdomen and pelvis was performed following the standard protocol without IV contrast. RADIATION DOSE REDUCTION: This exam was  performed according to the departmental dose-optimization program which includes automated exposure control, adjustment of the mA and/or kV according to patient size and/or use of iterative reconstruction technique. COMPARISON:  Prior CTs 12/10/2022 and 09/10/2022. FINDINGS: CT CHEST FINDINGS Cardiovascular: Right IJ Port-A-Cath extends into the superior aspect of the right atrium. Stable minimal aortic atherosclerosis. The heart size is normal. There is no pericardial effusion. Mediastinum/Nodes: There are no enlarged mediastinal, hilar or axillary lymph nodes. Hilar assessment is limited by the lack of intravenous contrast, although the hilar contours appear unchanged. The thyroid gland, trachea and esophagus demonstrate no significant findings. Lungs/Pleura: No pleural effusion or pneumothorax. Mild centrilobular and paraseptal emphysema with scattered central airway thickening. Further improvement in the spiculated left upper lobe nodule which currently measures 1.9 x 1.3 cm on image 37/4 (previously 3.0 x 2.6 cm). Additional scattered small nodules bilaterally are unchanged. No new or enlarging nodules are identified. Musculoskeletal/Chest wall: Grossly stable sclerotic osseous metastatic disease within the lower cervical spine and T12 vertebral body. No evidence of pathologic fracture or epidural tumor. CT ABDOMEN AND PELVIS FINDINGS Hepatobiliary: The liver appears stable, without suspicious findings on noncontrast imaging. Multiple calcified gallstones are again noted. No evidence of biliary dilatation or gallbladder wall thickening. Pancreas: Unremarkable. No pancreatic ductal dilatation or surrounding inflammatory changes. Spleen: Normal in size without focal abnormality. Adrenals/Urinary Tract: Both adrenal glands appear normal. No evidence of urinary tract calculus, suspicious renal lesion or hydronephrosis. The bladder appears unremarkable for its degree of distention, although remains partially  obscured by artifact from previous left femoral pinning. Stomach/Bowel: No enteric contrast administered. The stomach appears unremarkable for its degree of distension. No evidence of bowel wall thickening, distention or surrounding inflammatory change. The appendix appears unremarkable. Vascular/Lymphatic: No enlarged lymph nodes identified in the abdomen or pelvis. Minimal aortic atherosclerosis. Reproductive: The uterus and ovaries appear stable. No adnexal mass. Other: Stable small umbilical hernia containing only fat. No ascites, peritoneal nodularity or pneumoperitoneum. Musculoskeletal: Grossly stable widespread sclerotic osseous metastatic disease with chronic Schmorl's nodes involving the superior endplates of L3 and L4. Asymmetric involvement of the right bony pelvis, as before. No acute pathologic fracture or epidural tumor identified. Previous proximal left femoral pinning. Unless specific follow-up recommendations are mentioned in the findings or impression sections, no imaging follow-up of any mentioned incidental findings is recommended. IMPRESSION: 1. Further improvement in the spiculated left upper lobe nodule, consistent with response to therapy. 2. Additional scattered small pulmonary nodules bilaterally are unchanged. 3. No evidence of new or progressive metastatic disease in the chest, abdomen or pelvis. 4. Grossly stable widespread sclerotic osseous metastatic disease. 5. Cholelithiasis. 6. Aortic Atherosclerosis (ICD10-I70.0) and Emphysema (ICD10-J43.9). Electronically Signed   By: Carey Bullocks M.D.   On: 02/26/2023 15:33

## 2023-02-27 NOTE — Progress Notes (Signed)
DISCONTINUE ON PATHWAY REGIMEN - Non-Small Cell Lung     A cycle is every 21 days:     Paclitaxel      Carboplatin   **Always confirm dose/schedule in your pharmacy ordering system**  REASON: Other Reason PRIOR TREATMENT: [id number]: Carboplatin AUC=6 + Paclitaxel 200 mg/m2 q21 Days x 1 Cycle TREATMENT RESPONSE: Partial Response (PR)  START OFF PATHWAY REGIMEN - Non-Small Cell Lung   [id number]:Bevacizumab 15 mg/kg IV D1 q21 Days:   A cycle is every 21 days:     Bevacizumab-xxxx   **Always confirm dose/schedule in your pharmacy ordering system**  Patient Characteristics: Stage IV Metastatic, Nonsquamous, Molecular Analysis Completed, Molecular Alteration Present and Targeted Therapy Exhausted OR KRAS G12C+ or HER2+ Present and No Prior Chemo/Immunotherapy OR No Alteration Present, Second Line -  Chemotherapy/Immunotherapy, PS = 0, 1, No Prior PD-1/PD-L1  Inhibitor or Prior PD-1/PD-L1 Inhibitor + Chemotherapy, and Not a Candidate for Immunotherapy Therapeutic Status: Stage IV Metastatic Histology: Nonsquamous Cell Broad Molecular Profiling Status: Animal nutritionist Analysis Results: No Alteration Present ECOG Performance Status: 1 Chemotherapy/Immunotherapy Line of Therapy: Second Line Chemotherapy/Immunotherapy Immunotherapy Candidate Status: Not a Candidate for Immunotherapy Prior Immunotherapy Status: Prior PD-1/PD-L1 Inhibitor + Chemotherapy Intent of Therapy: Non-Curative / Palliative Intent, Discussed with Patient

## 2023-03-02 ENCOUNTER — Other Ambulatory Visit: Payer: Self-pay | Admitting: Internal Medicine

## 2023-03-03 ENCOUNTER — Encounter: Payer: Self-pay | Admitting: Internal Medicine

## 2023-03-03 MED ORDER — TRAMADOL HCL 50 MG PO TABS: 50.0000 mg | ORAL_TABLET | Freq: Four times a day (QID) | ORAL | 0 refills | Status: DC | PRN

## 2023-03-10 ENCOUNTER — Encounter: Payer: Self-pay | Admitting: Internal Medicine

## 2023-03-13 NOTE — Progress Notes (Signed)
Pharmacist Chemotherapy Monitoring - Initial Assessment    Anticipated start date: 03/27/23   The following has been reviewed per standard work regarding the patient's treatment regimen: The patient's diagnosis, treatment plan and drug doses, and organ/hematologic function Lab orders and baseline tests specific to treatment regimen  The treatment plan start date, drug sequencing, and pre-medications Prior authorization status  Patient's documented medication list, including drug-drug interaction screen and prescriptions for anti-emetics and supportive care specific to the treatment regimen The drug concentrations, fluid compatibility, administration routes, and timing of the medications to be used The patient's access for treatment and lifetime cumulative dose history, if applicable  The patient's medication allergies and previous infusion related reactions, if applicable   Changes made to treatment plan:  N/A  Follow up needed:  N/A   Sharen Hones, PharmD, BCPS Clinical Pharmacist   03/13/2023  4:15 PM

## 2023-03-24 ENCOUNTER — Encounter: Payer: Self-pay | Admitting: Internal Medicine

## 2023-03-27 ENCOUNTER — Inpatient Hospital Stay: Payer: PPO | Attending: Internal Medicine

## 2023-03-27 ENCOUNTER — Inpatient Hospital Stay (HOSPITAL_BASED_OUTPATIENT_CLINIC_OR_DEPARTMENT_OTHER): Payer: PPO | Admitting: Internal Medicine

## 2023-03-27 ENCOUNTER — Inpatient Hospital Stay: Payer: PPO

## 2023-03-27 VITALS — BP 132/87 | HR 97

## 2023-03-27 VITALS — BP 127/83 | HR 127 | Temp 98.7°F | Resp 18 | Wt 138.0 lb

## 2023-03-27 DIAGNOSIS — Z5112 Encounter for antineoplastic immunotherapy: Secondary | ICD-10-CM | POA: Insufficient documentation

## 2023-03-27 DIAGNOSIS — Z79899 Other long term (current) drug therapy: Secondary | ICD-10-CM | POA: Diagnosis not present

## 2023-03-27 DIAGNOSIS — N189 Chronic kidney disease, unspecified: Secondary | ICD-10-CM | POA: Insufficient documentation

## 2023-03-27 DIAGNOSIS — C3490 Malignant neoplasm of unspecified part of unspecified bronchus or lung: Secondary | ICD-10-CM

## 2023-03-27 DIAGNOSIS — C7931 Secondary malignant neoplasm of brain: Secondary | ICD-10-CM | POA: Insufficient documentation

## 2023-03-27 DIAGNOSIS — G620 Drug-induced polyneuropathy: Secondary | ICD-10-CM | POA: Diagnosis not present

## 2023-03-27 DIAGNOSIS — C3412 Malignant neoplasm of upper lobe, left bronchus or lung: Secondary | ICD-10-CM | POA: Diagnosis not present

## 2023-03-27 DIAGNOSIS — C7951 Secondary malignant neoplasm of bone: Secondary | ICD-10-CM | POA: Insufficient documentation

## 2023-03-27 DIAGNOSIS — M533 Sacrococcygeal disorders, not elsewhere classified: Secondary | ICD-10-CM | POA: Diagnosis not present

## 2023-03-27 DIAGNOSIS — Z5111 Encounter for antineoplastic chemotherapy: Secondary | ICD-10-CM | POA: Diagnosis present

## 2023-03-27 DIAGNOSIS — T451X5A Adverse effect of antineoplastic and immunosuppressive drugs, initial encounter: Secondary | ICD-10-CM | POA: Insufficient documentation

## 2023-03-27 LAB — CBC WITH DIFFERENTIAL (CANCER CENTER ONLY)
Abs Immature Granulocytes: 0.03 10*3/uL (ref 0.00–0.07)
Basophils Absolute: 0 10*3/uL (ref 0.0–0.1)
Basophils Relative: 0 %
Eosinophils Absolute: 0.1 10*3/uL (ref 0.0–0.5)
Eosinophils Relative: 3 %
HCT: 29.5 % — ABNORMAL LOW (ref 36.0–46.0)
Hemoglobin: 9.9 g/dL — ABNORMAL LOW (ref 12.0–15.0)
Immature Granulocytes: 1 %
Lymphocytes Relative: 19 %
Lymphs Abs: 0.9 10*3/uL (ref 0.7–4.0)
MCH: 35 pg — ABNORMAL HIGH (ref 26.0–34.0)
MCHC: 33.6 g/dL (ref 30.0–36.0)
MCV: 104.2 fL — ABNORMAL HIGH (ref 80.0–100.0)
Monocytes Absolute: 0.4 10*3/uL (ref 0.1–1.0)
Monocytes Relative: 8 %
Neutro Abs: 3.3 10*3/uL (ref 1.7–7.7)
Neutrophils Relative %: 69 %
Platelet Count: 152 10*3/uL (ref 150–400)
RBC: 2.83 MIL/uL — ABNORMAL LOW (ref 3.87–5.11)
RDW: 12.4 % (ref 11.5–15.5)
WBC Count: 4.7 10*3/uL (ref 4.0–10.5)
nRBC: 0 % (ref 0.0–0.2)

## 2023-03-27 LAB — TOTAL PROTEIN, URINE DIPSTICK: Protein, ur: NEGATIVE mg/dL

## 2023-03-27 MED ORDER — HEPARIN SOD (PORK) LOCK FLUSH 100 UNIT/ML IV SOLN
500.0000 [IU] | Freq: Once | INTRAVENOUS | Status: AC | PRN
  Administered 2023-03-27: 500 [IU]
  Filled 2023-03-27: qty 5

## 2023-03-27 MED ORDER — SODIUM CHLORIDE 0.9 % IV SOLN
15.0000 mg/kg | Freq: Once | INTRAVENOUS | Status: AC
  Administered 2023-03-27: 900 mg via INTRAVENOUS
  Filled 2023-03-27: qty 32

## 2023-03-27 MED ORDER — SODIUM CHLORIDE 0.9 % IV SOLN
INTRAVENOUS | Status: DC
Start: 2023-03-27 — End: 2023-03-27
  Filled 2023-03-27: qty 250

## 2023-03-27 NOTE — Progress Notes (Signed)
[facility] OFFICE PROGRESS NOTE  Patient Care Team: Dana Allan, MD as PCP - General (Family Medicine) Glory Buff, RN as Oncology Nurse Navigator Michaelyn Barter, MD as Consulting Physician (Oncology) Sondra Come, MD as Consulting Physician (Urology)  TREATMENT:  Right hip palliative RT 30 cGy completed 12/18/2021 Carboplatin, alimta and Keytruda x 4 cycles completed 02/19/22.  Discontinued maintenance alimta (04-23-22) due to worsening renal dysfunction, transaminitis and cytopenias Keytruda maintenance-discontinued on 09/17/2022 due to disease progression Palliative RT to left frontal lesion 5 fx completed 09/27/22 Palliative RT to left hip completed September 2024 Carboplatin (AUC 4) and paclitaxel 150 mg/m2 - started 10/07/2022- 01/21/2023. Maintenance Avastin 15 mg/kg-started 03/27/2023  ASSESSMENT & PLAN:  # Primary lung adenocarcinoma (HCC), Stage IV, PDL1 1% -Progressed through first-line [place] Alimta, Keytruda and then [place] maintenance on 09/17/2022.  -s/p second line carbo AUC 4 and Taxol 150 mg/m on 10/07/2022.  Completed 5 cycles and then discontinued due to severe cytopenias.  Repeat imaging showed positive response to treatment.  Patient agreed for maintenance Avastin (was not added with chemo because patient was hesitant).  CBC with differential and urine protein dipstick reviewed.  Proceed with cycle 1 of Avastin 15 mg/kg.  She does not prefer to have treatments during Christmas week.  So we will follow-up for cycle 2 in 4 weeks.  Plan to repeat scans in beginning of February 2025.  -Foundation 1 from August 2023 no targets.  Liquid foundation 1 CDX from June 2024 with no targetable mutation.  Repeat lung biopsy on 12/17/2022 sent for foundation 1 CDX testing was limited due to inadequate sample but no targets seen.  RNA fusion panel was sent on primary biopsy from August 2023.  It could not be run due to low tumor purity.  -Continue with care coordination  with [facility]. Kemper Durie at [facility].  Next follow-up is scheduled in couple of weeks.  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   -Follow-up with Dr. Barbaraann Cao.  Repeat brain MRI from 01/03/2023 - Slight contraction of the focus of enhancement at the inferiorleft temporal lobe occipital lobe junction. No worsening or progressive finding related to that. Again, the patient is seen to have extensive metastatic disease in the left frontal calvarium with adjacent dural thickening and enhancement. No evidence of convincing involvement of the adjacent brain today. Overall size is stable with maximal right-to-left dimension of 4.5 cm as measured on the T2 axial imaging.  -Next MRI brain is scheduled for January 2024.  # Chemotherapy induced peripheral neuropathy -Stable.  Started on gabapentin 100 mg 3 times daily, taper as tolerated.  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 Cr stabilized.  -will continue to monitor.  # Chemotherapy-induced anemia  -Aranesp 300 mcg as needed.   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 Aggie Cosier on 12/18/2021 -Completed palliative RT to left hip on 01/01/2023.  Pain has improved.  Taking tramadol 50 mg twice a day. -Was seen by [facility].  No surgical intervention needed.  # Metastatic bone lesions -Dental clearance not available.  Now we are starting on Avastin so I am concerned about any dental procedure due to increased risk of bleeding.  Per patient, she was only recommended regular cleaning.  I will reach out to Dr. Allyn Kenner about necessity of cleaning prior to denosumab or Zometa  # Access - port  RTC  in 4 weeks for MD visit, labs, cycle 2 Avastin  Orders Placed This Encounter  Procedures   CBC with Differential (Cancer Center Only)    Standing Status:   Future    Standing Expiration Date:   04/23/2024    Comprehensive metabolic panel    Standing Status:   Future    Standing Expiration Date:   03/26/2024   Total Protein, Urine dipstick    Standing Status:   Future    Standing Expiration Date:   03/26/2024    [facility]- 12/06/2021   All questions were answered. The patient knows to call the clinic with any problems, questions or concerns. The total time spent in the appointment was 30 minutes encounter with patients including review of chart and various tests results, discussions about plan of care and coordination of care plan   Michaelyn Barter, MD 03/27/2023 1:14 PM  INTERVAL HISTORY: Please see below for problem oriented charting.  Patient following with [place] for treatment of stage IV lung adenocarcinoma.   Patient seen today accompanied with daughter prior to starting cycle 1 of Avastin, labs. She has recently been enrolled in physical therapy.  Since then, she reports soreness in her lower back and left hip.  She is cutting down on the aggressiveness of the therapy.  Appetite is fair.  Denies any headache, dizziness, vision changes.  Did not have dental clearance.  REVIEW OF SYSTEMS:   Positive ROS as above.  Rest 10 points ROS negative   I have reviewed the past medical history, past surgical history, social history and family history with the patient and they are unchanged from previous note.  ALLERGIES:  is allergic to diclofenac, lisinopril, and sulfa antibiotics.  MEDICATIONS:  Current Outpatient Medications  Medication Sig Dispense Refill   albuterol (VENTOLIN HFA) 108 (90 Base) MCG/ACT inhaler Inhale 2 puffs into the lungs every 6 (six) hours as needed for wheezing or shortness of breath. 18 g 0   Azelastine HCl 137 MCG/SPRAY SOLN Place 2 sprays into both nostrils 2 (two) times daily.     Cholecalciferol (VITAMIN D-3 PO) Take by mouth.     dexamethasone (DECADRON) 4 MG tablet Take 1 tablet (4 mg total) by mouth daily. Take for 3 days after chemotherapy 30 tablet 0    fluticasone (FLONASE) 50 MCG/ACT nasal spray USE TWO SPRAYS IN EACH NOSTRIL ONCE DAILY prn 16 g 12   gabapentin (NEURONTIN) 100 MG capsule Take 1 capsule (100 mg total) by mouth 3 (three) times daily. 90 capsule 0   loratadine (CLARITIN) 10 MG tablet TAKE ONE TABLET BY MOUTH DAILY AS NEEDED FOR ALLERGIES 90 tablet 3   losartan (COZAAR) 100 MG tablet Take 1 tablet (100 mg total) by mouth daily. 90 tablet 3   magic mouthwash w/lidocaine SOLN Take 5 mLs by mouth 4 (four) times daily as needed for mouth pain. Sig: Swish/Swallow 5-10 ml four times a day as needed. Dispense 480 ml. 1RF (Patient not taking: Reported on 02/27/2023) 480 mL 1   MULTIPLE VITAMIN PO Take 1 tablet by mouth daily. (Patient not taking: Reported on 02/05/2023)     ondansetron (ZOFRAN) 8 MG tablet Take 1 tablet (8 mg total) by mouth every 8 (eight) hours as needed for nausea or vomiting. 30 tablet 3   traMADol (ULTRAM) 50 MG tablet Take 1 tablet (50 mg total) by mouth every 6 (six) hours as needed. 60 tablet 0   valACYclovir (VALTREX) 500 MG tablet Take 1 tablet (500 mg total) by  mouth 2 (two) times daily. For shingles prophylaxis (Patient not taking: Reported on 02/05/2023) 60 tablet 1   No current facility-administered medications for this visit.   Facility-Administered Medications Ordered in Other Visits  Medication Dose Route Frequency Provider Last Rate Last Admin   0.9 %  sodium chloride infusion   Intravenous Continuous Michaelyn Barter, MD   Stopped at 03/27/23 1236   heparin lock flush 100 UNIT/ML injection            heparin lock flush 100 unit/mL  500 Units Intravenous Once Michaelyn Barter, MD       sodium chloride flush (NS) 0.9 % injection 10 mL  10 mL Intravenous Once Michaelyn Barter, MD        SUMMARY OF ONCOLOGIC HISTORY: Oncology History  Primary lung adenocarcinoma (HCC)  10/10/2021 Initial Diagnosis   Primary lung adenocarcinoma (HCC) Stage IV  Patient seen by PCP for wheezing for 2 weeks. Imaging with CXR  followed by CT chest showed Spiculated 2.1 x 2.2 cm left upper lobe pulmonary nodule with associated pleural tethering. Innumerable subcentimeter pulmonary nodules throughout the lungs.      11/14/2021 PET scan   IMPRESSION: 1. Left suprahilar nodule with maximum SUV 4.5, and a more distal 2.2 cm left upper lobe nodule with maximum SUV of 5.5. 2. Innumerable scattered small pulmonary nodules, most of which are under 5 mm in diameter, throughout both lungs. Some are cavitary. Possibilities include cavitating hematogenous disseminated malignancy versus infectious etiology subset as septic emboli. Most of the nodules are stable, some are minimally enlarged compared to 10/10/2021. 3. Abnormal mild permeative type bony findings in the right hemipelvis associated with substantially accentuated metabolic activity. Possible associated pathologic fracture through the right quadrilateral plate and right acetabulum. The findings involve the right ilium and ischium but also the right lateral sacral ala and a small focus in the left sacrum.    Pathology Results   She had transbronchial biopsies by Dr. Jayme Cloud on 11/19/21.  Pathology from LUL nodule showed adenocarcinoma, consistent with lung primary.    11/19/2021 Cancer Staging   Staging form: Lung, AJCC 8th Edition - Clinical stage from 11/19/2021: Stage IVB (cT1c, cM1c) - Signed by Michaelyn Barter, MD on 11/27/2021 Stage prefix: Initial diagnosis   11/27/2021 Imaging   MRI pelvis  IMPRESSION: 1. Abnormal marrow signal throughout the right ilium involving the right acetabulum, right superior pubic ramus and right inferior pubic ramus, right side of the sacrum and a smaller focus of abnormal marrow lesion in the left side of the sacrum. Findings are concerning for metastatic disease. 2. Nondisplaced pathologic fracture of the quadrilateral plate of the right acetabulum. 3. Mild muscle edema in the right gluteus minimus and medius muscles likely  reflecting mild muscle strain.  MRI brain  IMPRESSION: No evidence of intracranial metastatic disease.   Indeterminate 1 cm left frontal calvarium lesion.   12/18/2021 - 08/27/2022 Chemotherapy   Patient is on Treatment Plan : LUNG Carboplatin (5) + Pemetrexed (500) + Pembrolizumab (200) D1 q21d Induction x 4 cycles / Maintenance Pemetrexed (500) + Pembrolizumab (200) D1 q21d     10/07/2022 - 01/21/2023 Chemotherapy   Patient is on Treatment Plan : LUNG Carboplatin + Paclitaxel q21d Dose Reduction     03/27/2023 -  Chemotherapy   Patient is on Treatment Plan : LUNG Bevacizumab q21d     Primary malignant neoplasm of left upper lobe of lung (HCC)  06/04/2022 Initial Diagnosis   Primary malignant neoplasm of left upper lobe of  lung (HCC)   06/04/2022 Cancer Staging   Staging form: Lung, AJCC 8th Edition - Pathologic: Stage IVB (pT1c, pNX, cM1c) - Signed by Earna Coder, MD on 06/04/2022     PHYSICAL EXAMINATION: ECOG PERFORMANCE STATUS: 2 - Symptomatic, <50% confined to bed  Vitals:   03/27/23 1000  BP: 127/83  Pulse: (!) 127  Resp: 18  Temp: 98.7 F (37.1 C)  SpO2: 100%    Filed Weights   03/27/23 1000  Weight: 138 lb (62.6 kg)     Physical Exam Constitutional:      Appearance: Normal appearance.  HENT:     Head: Normocephalic and atraumatic.  Cardiovascular:     Rate and Rhythm: Normal rate.  Pulmonary:     Effort: Pulmonary effort is normal.  Musculoskeletal:     Right lower leg: No edema.     Left lower leg: No edema.  Skin:    General: Skin is warm.  Neurological:     General: No focal deficit present.     Mental Status: She is oriented to person, place, and time.  Psychiatric:        Mood and Affect: Mood normal.     LABORATORY DATA:  I have reviewed the data as listed    Component Value Date/Time   NA 138 02/27/2023 1029   K 4.0 02/27/2023 1029   CL 108 02/27/2023 1029   CO2 24 02/27/2023 1029   GLUCOSE 121 (H) 02/27/2023 1029   BUN 21  02/27/2023 1029   CREATININE 1.29 (H) 02/27/2023 1029   CREATININE 1.33 (H) 02/13/2023 0859   CALCIUM 9.1 02/27/2023 1029   PROT 6.9 02/27/2023 1029   ALBUMIN 3.5 02/27/2023 1029   AST 20 02/27/2023 1029   AST 20 02/13/2023 0859   ALT 11 02/27/2023 1029   ALT 13 02/13/2023 0859   ALKPHOS 198 (H) 02/27/2023 1029   BILITOT 0.6 02/27/2023 1029   BILITOT 0.5 02/13/2023 0859   GFRNONAA 44 (L) 02/27/2023 1029   GFRNONAA 43 (L) 02/13/2023 0859    No results found for: "SPEP", "UPEP"  Lab Results  Component Value Date   WBC 4.7 03/27/2023   NEUTROABS 3.3 03/27/2023   HGB 9.9 (L) 03/27/2023   HCT 29.5 (L) 03/27/2023   MCV 104.2 (H) 03/27/2023   PLT 152 03/27/2023      Chemistry      Component Value Date/Time   NA 138 02/27/2023 1029   K 4.0 02/27/2023 1029   CL 108 02/27/2023 1029   CO2 24 02/27/2023 1029   BUN 21 02/27/2023 1029   CREATININE 1.29 (H) 02/27/2023 1029   CREATININE 1.33 (H) 02/13/2023 0859      Component Value Date/Time   CALCIUM 9.1 02/27/2023 1029   ALKPHOS 198 (H) 02/27/2023 1029   AST 20 02/27/2023 1029   AST 20 02/13/2023 0859   ALT 11 02/27/2023 1029   ALT 13 02/13/2023 0859   BILITOT 0.6 02/27/2023 1029   BILITOT 0.5 02/13/2023 0859       RADIOGRAPHIC STUDIES: I have personally reviewed the radiological images as listed and agreed with the findings in the report. No results found.

## 2023-03-27 NOTE — Progress Notes (Signed)
Patient says that she started physical therapy last week, so she is really sore in her abdomen and lower back, pain rate is about a 4. Her appetite has decreased since last week, she doesn't find anything appealing or appetizing to her.

## 2023-03-27 NOTE — Patient Instructions (Signed)
[facility] - A DEPT OF [facility]  Discharge Instructions: Thank you for choosing [facility] to provide your oncology and hematology care.  If you have a lab appointment with the Cancer Center, please go directly to the Cancer Center and check in at the registration area.  Wear comfortable clothing and clothing appropriate for easy access to any Portacath or PICC line.   We strive to give you quality time with your provider. You may need to reschedule your appointment if you arrive late (15 or more minutes).  Arriving late affects you and other patients whose appointments are after yours.  Also, if you miss three or more appointments without notifying the office, you may be dismissed from the clinic at the provider's discretion.      For prescription refill requests, have your pharmacy contact our office and allow 72 hours for refills to be completed.    Today you received the following chemotherapy and/or immunotherapy agents Bevacizumab      To help prevent nausea and vomiting after your treatment, we encourage you to take your nausea medication as directed.  BELOW ARE SYMPTOMS THAT SHOULD BE REPORTED IMMEDIATELY: *FEVER GREATER THAN 100.4 F (38 C) OR HIGHER *CHILLS OR SWEATING *NAUSEA AND VOMITING THAT IS NOT CONTROLLED WITH YOUR NAUSEA MEDICATION *UNUSUAL SHORTNESS OF BREATH *UNUSUAL BRUISING OR BLEEDING *URINARY PROBLEMS (pain or burning when urinating, or frequent urination) *BOWEL PROBLEMS (unusual diarrhea, constipation, pain near the anus) TENDERNESS IN MOUTH AND THROAT WITH OR WITHOUT PRESENCE OF ULCERS (sore throat, sores in mouth, or a toothache) UNUSUAL RASH, SWELLING OR PAIN  UNUSUAL VAGINAL DISCHARGE OR ITCHING   Items with * indicate a potential emergency and should be followed up as soon as possible or go to the Emergency Department if any problems should occur.  Please show the CHEMOTHERAPY ALERT CARD or IMMUNOTHERAPY  ALERT CARD at check-in to the Emergency Department and triage nurse.  Should you have questions after your visit or need to cancel or reschedule your appointment, please contact [facility] - A DEPT OF [facility]  [phone] and follow the prompts.  Office hours are 8:00 a.m. to 4:30 p.m. Monday - Friday. Please note that voicemails left after 4:00 p.m. may not be returned until the following business day.  We are closed weekends and major holidays. You have access to a nurse at all times for urgent questions. Please call the main number to the clinic [phone] and follow the prompts.  For any non-urgent questions, you may also contact your provider using MyChart. We now offer e-Visits for anyone 51 and older to request care online for non-urgent symptoms. For details visit [email or web address].   Also download the MyChart app! Go to the app store, search "MyChart", open the app, select , and log in with your MyChart username and password.    Bevacizumab Injection What is this medication? BEVACIZUMAB (be va SIZ yoo mab) treats some types of cancer. It works by blocking a protein that causes cancer cells to grow and multiply. This helps to slow or stop the spread of cancer cells. It is a monoclonal antibody. This medicine may be used for other purposes; ask your health care provider or pharmacist if you have questions. COMMON BRAND NAME(S): Alymsys, Avastin, MVASI, Omer Jack What should I tell my care team before I take this medication? They need to know if you have any of these conditions:  Blood clots Coughing up blood Having or recent surgery Heart failure High blood pressure History of a connection between 2 or more body parts that do not usually connect (fistula) History of a tear in your stomach or intestines Protein in your urine An unusual or allergic reaction to bevacizumab, other medications, foods, dyes, or preservatives Pregnant  or trying to get pregnant Breast-feeding How should I use this medication? This medication is injected into a vein. It is given by your care team in a hospital or clinic setting. Talk to your care team the use of this medication in children. Special care may be needed. Overdosage: If you think you have taken too much of this medicine contact a poison control center or emergency room at once. NOTE: This medicine is only for you. Do not share this medicine with others. What if I miss a dose? Keep appointments for follow-up doses. It is important not to miss your dose. Call your care team if you are unable to keep an appointment. What may interact with this medication? Interactions are not expected. This list may not describe all possible interactions. Give your health care provider a list of all the medicines, herbs, non-prescription drugs, or dietary supplements you use. Also tell them if you smoke, drink alcohol, or use illegal drugs. Some items may interact with your medicine. What should I watch for while using this medication? Your condition will be monitored carefully while you are receiving this medication. You may need blood work while taking this medication. This medication may make you feel generally unwell. This is not uncommon as chemotherapy can affect healthy cells as well as cancer cells. Report any side effects. Continue your course of treatment even though you feel ill unless your care team tells you to stop. This medication may increase your risk to bruise or bleed. Call your care team if you notice any unusual bleeding. Before having surgery, talk to your care team to make sure it is ok. This medication can increase the risk of poor healing of your surgical site or wound. You will need to stop this medication for 28 days before surgery. After surgery, wait at least 28 days before restarting this medication. Make sure the surgical site or wound is healed enough before restarting this  medication. Talk to your care team if questions. Talk to your care team if you may be pregnant. Serious birth defects can occur if you take this medication during pregnancy and for 6 months after the last dose. Contraception is recommended while taking this medication and for 6 months after the last dose. Your care team can help you find the option that works for you. Do not breastfeed while taking this medication and for 6 months after the last dose. This medication can cause infertility. Talk to your care team if you are concerned about your fertility. What side effects may I notice from receiving this medication? Side effects that you should report to your care team as soon as possible: Allergic reactions--skin rash, itching, hives, swelling of the face, lips, tongue, or throat Bleeding--bloody or black, tar-like stools, vomiting blood or brown material that looks like coffee grounds, red or dark brown urine, small red or purple spots on skin, unusual bruising or bleeding Blood clot--pain, swelling, or warmth in the leg, shortness of breath, chest pain Heart attack--pain or tightness in the chest, shoulders, arms, or jaw, nausea, shortness of breath, cold or clammy skin, feeling faint or lightheaded Heart failure--shortness of breath, swelling of the  ankles, feet, or hands, sudden weight gain, unusual weakness or fatigue Increase in blood pressure Infection--fever, chills, cough, sore throat, wounds that don't heal, pain or trouble when passing urine, general feeling of discomfort or being unwell Infusion reactions--chest pain, shortness of breath or trouble breathing, feeling faint or lightheaded Kidney injury--decrease in the amount of urine, swelling of the ankles, hands, or feet Stomach pain that is severe, does not go away, or gets worse Stroke--sudden numbness or weakness of the face, arm, or leg, trouble speaking, confusion, trouble walking, loss of balance or coordination, dizziness,  severe headache, change in vision Sudden and severe headache, confusion, change in vision, seizures, which may be signs of posterior reversible encephalopathy syndrome (PRES) Side effects that usually do not require medical attention (report to your care team if they continue or are bothersome): Back pain Change in taste Diarrhea Dry skin Increased tears Nosebleed This list may not describe all possible side effects. Call your doctor for medical advice about side effects. You may report side effects to FDA at 1-800-FDA-1088. Where should I keep my medication? This medication is given in a hospital or clinic. It will not be stored at home. NOTE: This sheet is a summary. It may not cover all possible information. If you have questions about this medicine, talk to your doctor, pharmacist, or health care provider.  2024 Elsevier/Gold Standard (2021-08-24 00:00:00)

## 2023-03-31 ENCOUNTER — Telehealth: Payer: Self-pay

## 2023-03-31 ENCOUNTER — Encounter: Payer: Self-pay | Admitting: Internal Medicine

## 2023-03-31 DIAGNOSIS — C3490 Malignant neoplasm of unspecified part of unspecified bronchus or lung: Secondary | ICD-10-CM

## 2023-03-31 DIAGNOSIS — M25551 Pain in right hip: Secondary | ICD-10-CM

## 2023-03-31 NOTE — Telephone Encounter (Signed)
Dr. Alena Bills wanted a Right Hip CT scan with and without contrast, so Molli Posey put the order into the patients chart, and we let scheduling know.

## 2023-04-01 ENCOUNTER — Telehealth: Payer: PPO | Admitting: Urology

## 2023-04-01 DIAGNOSIS — N3289 Other specified disorders of bladder: Secondary | ICD-10-CM

## 2023-04-01 NOTE — Progress Notes (Signed)
Virtual Visit via video note  I connected with KEVIN BROWNELL on 04/01/23 at  1:15 PM EST by telephone and verified that I am speaking with the correct person using two identifiers.   Patient location: Home Provider location: [facility] Office   I discussed the limitations, risks, security and privacy concerns of performing an evaluation and management service by telephone and the availability of in person appointments. We discussed the impact of the COVID-19 pandemic on the healthcare system, and the importance of social distancing and reducing patient and provider exposure. I also discussed with the patient that there may be a patient responsible charge related to this service. The patient expressed understanding and agreed to proceed.  Reason for visit: Bladder wall thickening  History of Present Illness: 72 year old female with stage IV metastatic lung cancer with fairly extensive disease including brain and bone lesions.  She is being managed with oncology.  She was referred in October for subtle bladder wall thickening seen on recent CT scan.  Urinalysis at that visit was benign, and I reviewed the imaging which was minimally suspicious for a bladder lesion.  She opted for reevaluation on her routine imaging obtained through oncology.  I reviewed the CT chest abdomen pelvis from 02/24/2023 and bladder appears grossly normal.  Very low suspicion for bladder malignancy, she defers cystoscopy at this time which I think is very reasonable.  Return precautions including gross hematuria were discussed.  Follow Up: Follow-up with urology as needed   I discussed the assessment and treatment plan with the patient. The patient was provided an opportunity to ask questions and all were answered. The patient agreed with the plan and demonstrated an understanding of the instructions.   The patient was advised to call back or seek an in-person evaluation if the symptoms worsen or if the  condition fails to improve as anticipated.  I provided 5 minutes of non-face-to-face time during this encounter.   Sondra Come, MD

## 2023-04-03 ENCOUNTER — Other Ambulatory Visit: Payer: Self-pay

## 2023-04-03 DIAGNOSIS — C3490 Malignant neoplasm of unspecified part of unspecified bronchus or lung: Secondary | ICD-10-CM

## 2023-04-04 ENCOUNTER — Other Ambulatory Visit: Payer: Self-pay | Admitting: Internal Medicine

## 2023-04-09 ENCOUNTER — Ambulatory Visit: Admission: RE | Admit: 2023-04-09 | Payer: PPO | Source: Ambulatory Visit

## 2023-04-10 ENCOUNTER — Encounter
Admission: RE | Admit: 2023-04-10 | Discharge: 2023-04-10 | Disposition: A | Discharge status: Home / Self Care | Payer: PPO | Source: Ambulatory Visit | Attending: Internal Medicine | Admitting: Internal Medicine

## 2023-04-10 DIAGNOSIS — C3490 Malignant neoplasm of unspecified part of unspecified bronchus or lung: Secondary | ICD-10-CM | POA: Diagnosis present

## 2023-04-10 MED ORDER — TECHNETIUM TC 99M MEDRONATE IV KIT
20.0000 | PACK | Freq: Once | INTRAVENOUS | Status: AC | PRN
Start: 2023-04-10 — End: 2023-04-10
  Administered 2023-04-10: 22.35 via INTRAVENOUS

## 2023-04-21 ENCOUNTER — Encounter: Payer: Self-pay | Admitting: Internal Medicine

## 2023-04-24 ENCOUNTER — Encounter: Payer: Self-pay | Admitting: Internal Medicine

## 2023-04-24 ENCOUNTER — Ambulatory Visit: Payer: PPO

## 2023-04-24 ENCOUNTER — Inpatient Hospital Stay: Payer: PPO

## 2023-04-24 ENCOUNTER — Ambulatory Visit: Payer: PPO | Admitting: Internal Medicine

## 2023-04-24 ENCOUNTER — Inpatient Hospital Stay: Payer: PPO | Attending: Internal Medicine

## 2023-04-24 ENCOUNTER — Inpatient Hospital Stay (HOSPITAL_BASED_OUTPATIENT_CLINIC_OR_DEPARTMENT_OTHER): Payer: PPO | Attending: Internal Medicine | Admitting: Internal Medicine

## 2023-04-24 ENCOUNTER — Other Ambulatory Visit: Payer: PPO

## 2023-04-24 VITALS — BP 138/90 | HR 129 | Temp 97.6°F | Resp 16 | Wt 137.0 lb

## 2023-04-24 VITALS — HR 85

## 2023-04-24 DIAGNOSIS — C3490 Malignant neoplasm of unspecified part of unspecified bronchus or lung: Secondary | ICD-10-CM

## 2023-04-24 DIAGNOSIS — Z7952 Long term (current) use of systemic steroids: Secondary | ICD-10-CM | POA: Diagnosis not present

## 2023-04-24 DIAGNOSIS — K5900 Constipation, unspecified: Secondary | ICD-10-CM | POA: Insufficient documentation

## 2023-04-24 DIAGNOSIS — C7951 Secondary malignant neoplasm of bone: Secondary | ICD-10-CM | POA: Insufficient documentation

## 2023-04-24 DIAGNOSIS — D6481 Anemia due to antineoplastic chemotherapy: Secondary | ICD-10-CM | POA: Insufficient documentation

## 2023-04-24 DIAGNOSIS — C3412 Malignant neoplasm of upper lobe, left bronchus or lung: Secondary | ICD-10-CM | POA: Diagnosis not present

## 2023-04-24 DIAGNOSIS — Z79899 Other long term (current) drug therapy: Secondary | ICD-10-CM | POA: Insufficient documentation

## 2023-04-24 DIAGNOSIS — C7931 Secondary malignant neoplasm of brain: Secondary | ICD-10-CM | POA: Diagnosis not present

## 2023-04-24 DIAGNOSIS — Z5111 Encounter for antineoplastic chemotherapy: Secondary | ICD-10-CM | POA: Diagnosis not present

## 2023-04-24 DIAGNOSIS — G893 Neoplasm related pain (acute) (chronic): Secondary | ICD-10-CM | POA: Insufficient documentation

## 2023-04-24 DIAGNOSIS — G620 Drug-induced polyneuropathy: Secondary | ICD-10-CM | POA: Diagnosis not present

## 2023-04-24 DIAGNOSIS — T451X5A Adverse effect of antineoplastic and immunosuppressive drugs, initial encounter: Secondary | ICD-10-CM | POA: Diagnosis not present

## 2023-04-24 DIAGNOSIS — Z5112 Encounter for antineoplastic immunotherapy: Secondary | ICD-10-CM

## 2023-04-24 DIAGNOSIS — N189 Chronic kidney disease, unspecified: Secondary | ICD-10-CM | POA: Diagnosis not present

## 2023-04-24 LAB — CBC WITH DIFFERENTIAL (CANCER CENTER ONLY)
Abs Immature Granulocytes: 0.02 10*3/uL (ref 0.00–0.07)
Basophils Absolute: 0 10*3/uL (ref 0.0–0.1)
Basophils Relative: 1 %
Eosinophils Absolute: 0.1 10*3/uL (ref 0.0–0.5)
Eosinophils Relative: 2 %
HCT: 30.7 % — ABNORMAL LOW (ref 36.0–46.0)
Hemoglobin: 10.3 g/dL — ABNORMAL LOW (ref 12.0–15.0)
Immature Granulocytes: 1 %
Lymphocytes Relative: 19 %
Lymphs Abs: 0.7 10*3/uL (ref 0.7–4.0)
MCH: 34.3 pg — ABNORMAL HIGH (ref 26.0–34.0)
MCHC: 33.6 g/dL (ref 30.0–36.0)
MCV: 102.3 fL — ABNORMAL HIGH (ref 80.0–100.0)
Monocytes Absolute: 0.4 10*3/uL (ref 0.1–1.0)
Monocytes Relative: 10 %
Neutro Abs: 2.7 10*3/uL (ref 1.7–7.7)
Neutrophils Relative %: 67 %
Platelet Count: 120 10*3/uL — ABNORMAL LOW (ref 150–400)
RBC: 3 MIL/uL — ABNORMAL LOW (ref 3.87–5.11)
RDW: 11.8 % (ref 11.5–15.5)
WBC Count: 3.9 10*3/uL — ABNORMAL LOW (ref 4.0–10.5)
nRBC: 0 % (ref 0.0–0.2)

## 2023-04-24 LAB — COMPREHENSIVE METABOLIC PANEL
ALT: 12 U/L (ref 0–44)
AST: 21 U/L (ref 15–41)
Albumin: 3.7 g/dL (ref 3.5–5.0)
Alkaline Phosphatase: 188 U/L — ABNORMAL HIGH (ref 38–126)
Anion gap: 10 (ref 5–15)
BUN: 27 mg/dL — ABNORMAL HIGH (ref 8–23)
CO2: 22 mmol/L (ref 22–32)
Calcium: 9.7 mg/dL (ref 8.9–10.3)
Chloride: 102 mmol/L (ref 98–111)
Creatinine, Ser: 1.41 mg/dL — ABNORMAL HIGH (ref 0.44–1.00)
GFR, Estimated: 40 mL/min — ABNORMAL LOW (ref >60)
Glucose, Bld: 114 mg/dL — ABNORMAL HIGH (ref 70–99)
Potassium: 4.2 mmol/L (ref 3.5–5.1)
Sodium: 134 mmol/L — ABNORMAL LOW (ref 135–145)
Total Bilirubin: 0.5 mg/dL (ref 0.0–1.2)
Total Protein: 7 g/dL (ref 6.5–8.1)

## 2023-04-24 LAB — TOTAL PROTEIN, URINE DIPSTICK: Protein, ur: NEGATIVE mg/dL

## 2023-04-24 MED ORDER — SODIUM CHLORIDE 0.9 % IV SOLN
INTRAVENOUS | Status: DC
  Filled 2023-04-24: qty 250

## 2023-04-24 MED ORDER — HEPARIN SOD (PORK) LOCK FLUSH 100 UNIT/ML IV SOLN
500.0000 [IU] | Freq: Once | INTRAVENOUS | Status: AC | PRN
  Administered 2023-04-24: 500 [IU]
  Filled 2023-04-24: qty 5

## 2023-04-24 MED ORDER — SODIUM CHLORIDE 0.9 % IV SOLN
15.0000 mg/kg | Freq: Once | INTRAVENOUS | Status: AC
  Administered 2023-04-24: 900 mg via INTRAVENOUS
  Filled 2023-04-24: qty 32

## 2023-04-24 MED ORDER — SODIUM CHLORIDE 0.9% FLUSH
10.0000 mL | INTRAVENOUS | Status: DC | PRN
  Administered 2023-04-24: 10 mL
  Filled 2023-04-24: qty 10

## 2023-04-24 NOTE — Patient Instructions (Signed)
 [facility] - A DEPT OF [facility]  Discharge Instructions: Thank you for choosing [facility] to provide your oncology and hematology care.  If you have a lab appointment with the Cancer Center, please go directly to the Cancer Center and check in at the registration area.  Wear comfortable clothing and clothing appropriate for easy access to any Portacath or PICC line.   We strive to give you quality time with your provider. You may need to reschedule your appointment if you arrive late (15 or more minutes).  Arriving late affects you and other patients whose appointments are after yours.  Also, if you miss three or more appointments without notifying the office, you may be dismissed from the clinic at the provider's discretion.      For prescription refill requests, have your pharmacy contact our office and allow 72 hours for refills to be completed.    Today you received the following chemotherapy and/or immunotherapy agents Bevacizumab .      To help prevent nausea and vomiting after your treatment, we encourage you to take your nausea medication as directed.  BELOW ARE SYMPTOMS THAT SHOULD BE REPORTED IMMEDIATELY: *FEVER GREATER THAN 100.4 F (38 C) OR HIGHER *CHILLS OR SWEATING *NAUSEA AND VOMITING THAT IS NOT CONTROLLED WITH YOUR NAUSEA MEDICATION *UNUSUAL SHORTNESS OF BREATH *UNUSUAL BRUISING OR BLEEDING *URINARY PROBLEMS (pain or burning when urinating, or frequent urination) *BOWEL PROBLEMS (unusual diarrhea, constipation, pain near the anus) TENDERNESS IN MOUTH AND THROAT WITH OR WITHOUT PRESENCE OF ULCERS (sore throat, sores in mouth, or a toothache) UNUSUAL RASH, SWELLING OR PAIN  UNUSUAL VAGINAL DISCHARGE OR ITCHING   Items with * indicate a potential emergency and should be followed up as soon as possible or go to the Emergency Department if any problems should occur.  Please show the CHEMOTHERAPY ALERT CARD or IMMUNOTHERAPY  ALERT CARD at check-in to the Emergency Department and triage nurse.  Should you have questions after your visit or need to cancel or reschedule your appointment, please contact [facility] - A DEPT OF [facility]  [phone] and follow the prompts.  Office hours are 8:00 a.m. to 4:30 p.m. Monday - Friday. Please note that voicemails left after 4:00 p.m. may not be returned until the following business day.  We are closed weekends and major holidays. You have access to a nurse at all times for urgent questions. Please call the main number to the clinic [phone] and follow the prompts.  For any non-urgent questions, you may also contact your provider using MyChart. We now offer e-Visits for anyone 46 and older to request care online for non-urgent symptoms. For details visit [email or web address].   Also download the MyChart app! Go to the app store, search MyChart, open the app, select [facility], and log in with your MyChart username and password.

## 2023-04-24 NOTE — Progress Notes (Signed)
 Nutrition Follow-up:  Patient with stage IV lung cancer.  Patient on bevacizumab .    Met with patient during infusion.  Reports that she gets hungry and eats a little then has to stop then gets hungry later and eats more.  Yesterday able to eat salmon and grits for breakfast and greens, pinto beans and cornbread for dinner.  Ate yogurt as a snack yesterday.  Drinks ensure complete 1 a day.  Denies nausea.    Medications: reviewed  Labs: reviewed  Anthropometrics:   Weight 137 lb today  136 lb on 10/1 139 lb on 8/9 129 lb on 7/29 141 lb on 4/16   NUTRITION DIAGNOSIS: Unintentional weight loss stable    INTERVENTION:  Recommend increasing ensure complete to BID if possible Encouraged patient to eat every time she feels hungry and not let sensation pass without eating. Discussed high calorie, high protein foods to consume    MONITORING, EVALUATION, GOAL: weight trends, intake   NEXT VISIT: as needed  Bibiana Gillean B. Dasie, RD, LDN Registered Dietitian [phone]

## 2023-04-24 NOTE — Progress Notes (Signed)
 Starting on thanksgiving, both hips and shoulders are painful and stiff feeling. She rates her pain at about a 3 today. She has been off of her BP medication for about a month now.

## 2023-04-25 ENCOUNTER — Encounter: Payer: Self-pay | Admitting: Internal Medicine

## 2023-04-25 DIAGNOSIS — G893 Neoplasm related pain (acute) (chronic): Secondary | ICD-10-CM | POA: Insufficient documentation

## 2023-04-25 NOTE — Progress Notes (Signed)
 [facility] OFFICE PROGRESS NOTE  Patient Care Team: Hope Merle, MD as PCP - General (Family Medicine) Verdene Gills, RN as Oncology Nurse Navigator Clista Bimler, MD as Consulting Physician (Oncology) Francisca Redell BROCKS, MD as Consulting Physician (Urology)  TREATMENT:  Right hip palliative RT 30 cGy completed 12/18/2021 Carboplatin , alimta  and Keytruda  x 4 cycles completed 02/19/22.  Discontinued maintenance alimta  (04-23-22) due to worsening renal dysfunction, transaminitis and cytopenias Keytruda  maintenance-discontinued on 09/17/2022 due to disease progression Palliative RT to left frontal lesion 5 fx completed 09/27/22 Palliative RT to left hip completed September 2024 Carboplatin  (AUC 4) and paclitaxel  150 mg/m2 - started 10/07/2022- 01/21/2023. Maintenance Avastin  15 mg/kg-started 03/27/2023  ASSESSMENT & PLAN:  # Primary lung adenocarcinoma (HCC), Stage IV, PDL1 1% -Progressed through first-line carbo Alimta , Keytruda  and then Keytruda  maintenance on 09/17/2022.  -s/p second line carbo AUC 4 and Taxol  150 mg/m on 10/07/2022.  Completed 5 cycles and then discontinued due to severe cytopenias.  Patient agreed for maintenance Avastin  (was not added with chemo because patient was hesitant).  CBC with differential and urine protein dipstick reviewed.   -On maintenance Avastin  15 mg/kg every 3 weeks started on 03/27/2023.  Labs reviewed and acceptable for treatment.  Urine dipstick negative for protein.  Scheduled for repeat CT scans beginning of February 2025.  Bone scan was done due to bilateral hip and shoulder pain which is showing positive response to treatment.  No new metastatic disease.  Discussed with patient to continue with tramadol  50 mg every 8 hour.  We discussed about the option of adding Tylenol  and can also increase the dose of tramadol  to 1-1/2 to 2 pills.  -Foundation 1 from August 2023 no targets.  Liquid foundation 1 CDX from June 2024 with no targetable  mutation.  Repeat lung biopsy on 12/17/2022 sent for foundation 1 CDX testing was limited due to inadequate sample but no targets seen.  RNA fusion panel was sent on primary biopsy from August 2023.  It could not be run due to low tumor purity.  -Continue with care coordination with Dr. Orval at [facility].    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   -Follow-up with Dr. Buckley.  Repeat brain MRI from 01/03/2023 - Slight contraction of the focus of enhancement at the inferiorleft temporal lobe occipital lobe junction. No worsening or progressive finding related to that. Again, the patient is seen to have extensive metastatic disease in the left frontal calvarium with adjacent dural thickening and enhancement. No evidence of convincing involvement of the adjacent brain today. Overall size is stable with maximal right-to-left dimension of 4.5 cm as measured on the T2 axial imaging.  -Next MRI brain is scheduled for January 2024.  # Chemotherapy induced peripheral neuropathy -Stable.  Started on gabapentin  100 mg 3 times daily, taper as tolerated.  #CKD -secondary to Alimta . Cr stabilized.  -will continue to monitor.  # Chemotherapy-induced anemia  -Aranesp  300 mcg as needed.   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 Camelia on 12/18/2021 -Completed palliative RT to left hip on 01/01/2023.  Pain has improved.  Taking tramadol  50 mg twice a day. -Was seen by [facility].  No surgical intervention needed.  # Metastatic bone lesions -Dental clearance not available.  Now we are starting on Avastin  so I am concerned about any dental procedure due  to increased risk of bleeding.  Per patient, she was only recommended regular cleaning.  Message left with Dr. Rockie Overly office.  # Access - port  RTC in 3 weeks for MD visit,  labs, Avastin   Orders Placed This Encounter  Procedures   CT CHEST ABDOMEN PELVIS WO CONTRAST    Standing Status:   Future    Expected Date:   05/26/2023    Expiration Date:   04/23/2024    Preferred imaging location?:    [facility]    If indicated for the ordered procedure, I authorize the administration of oral contrast media per Radiology protocol:   Yes    Does the patient have a contrast media/X-ray dye allergy?:   No   CBC with Differential (Cancer Center Only)    Standing Status:   Future    Expected Date:   05/15/2023    Expiration Date:   05/14/2024   Total Protein, Urine dipstick    Standing Status:   Future    Expected Date:   05/15/2023    Expiration Date:   05/14/2024   CBC with Differential (Cancer Center Only)    Standing Status:   Future    Expected Date:   06/05/2023    Expiration Date:   06/04/2024    [facility]- 12/06/2021   All questions were answered. The patient knows to call the clinic with any problems, questions or concerns. The total time spent in the appointment was 30 minutes encounter with patients including review of chart and various tests results, discussions about plan of care and coordination of care plan   Genevia Rous, MD 04/25/2023 10:06 AM  INTERVAL HISTORY: Please see below for problem oriented charting.  Patient following with us  for treatment of stage IV lung adenocarcinoma.   Patient seen today accompanied with daughter prior to  Avastin , labs. She is doing reasonably well overall.  Does complain of diffuse bone pain for example in bilateral hip bilateral shoulder.  Bone scan overall showed improved to disease.  She is taking tramadol  1 tablet every 8 hours as needed which is mildly helping.  Hesitant about increasing the dose.  Denies any dizziness, headaches or changes in vision.  REVIEW OF SYSTEMS:   Positive ROS as above.  Rest 10 points ROS negative   I have reviewed the past medical history, past surgical history, social  history and family history with the patient and they are unchanged from previous note.  ALLERGIES:  is allergic to diclofenac, lisinopril, and sulfa  antibiotics.  MEDICATIONS:  Current Outpatient Medications  Medication Sig Dispense Refill   albuterol  (VENTOLIN  HFA) 108 (90 Base) MCG/ACT inhaler Inhale 2 puffs into the lungs every 6 (six) hours as needed for wheezing or shortness of breath. 18 g 0   Azelastine  HCl 137 MCG/SPRAY SOLN Place 2 sprays into both nostrils 2 (two) times daily.     Cholecalciferol (VITAMIN D -3 PO) Take by mouth.     dexamethasone  (DECADRON ) 4 MG tablet Take 1 tablet (4 mg total) by mouth daily. Take for 3 days after chemotherapy 30 tablet 0   fluticasone  (FLONASE ) 50 MCG/ACT nasal spray USE TWO SPRAYS IN EACH NOSTRIL ONCE DAILY prn 16 g 12   gabapentin  (NEURONTIN ) 100 MG capsule Take 1 capsule (100 mg total) by mouth 3 (three) times daily. 90 capsule 0   loratadine  (CLARITIN ) 10 MG tablet TAKE ONE TABLET BY MOUTH DAILY AS NEEDED FOR ALLERGIES 90 tablet 3   losartan  (COZAAR ) 100 MG tablet Take 1  tablet (100 mg total) by mouth daily. 90 tablet 3   magic mouthwash w/lidocaine  SOLN Take 5 mLs by mouth 4 (four) times daily as needed for mouth pain. Sig: Swish/Swallow 5-10 ml four times a day as needed. Dispense 480 ml. 1RF 480 mL 1   MULTIPLE VITAMIN PO Take 1 tablet by mouth daily.     ondansetron  (ZOFRAN ) 8 MG tablet Take 1 tablet (8 mg total) by mouth every 8 (eight) hours as needed for nausea or vomiting. 30 tablet 3   traMADol  (ULTRAM ) 50 MG tablet TAKE 1 TABLET BY MOUTH EVERY 6 HOURS AS NEEDED 60 tablet 0   valACYclovir  (VALTREX ) 500 MG tablet Take 1 tablet (500 mg total) by mouth 2 (two) times daily. For shingles prophylaxis 60 tablet 1   No current facility-administered medications for this visit.   Facility-Administered Medications Ordered in Other Visits  Medication Dose Route Frequency Provider Last Rate Last Admin   heparin  lock flush 100 UNIT/ML injection             heparin  lock flush 100 unit/mL  500 Units Intravenous Once Bronx Brogden, MD       sodium chloride  flush (NS) 0.9 % injection 10 mL  10 mL Intravenous Once Tatum Massman, MD        SUMMARY OF ONCOLOGIC HISTORY: Oncology History  Primary lung adenocarcinoma (HCC)  10/10/2021 Initial Diagnosis   Primary lung adenocarcinoma (HCC) Stage IV  Patient seen by PCP for wheezing for 2 weeks. Imaging with CXR followed by CT chest showed Spiculated 2.1 x 2.2 cm left upper lobe pulmonary nodule with associated pleural tethering. Innumerable subcentimeter pulmonary nodules throughout the lungs.      11/14/2021 PET scan   IMPRESSION: 1. Left suprahilar nodule with maximum SUV 4.5, and a more distal 2.2 cm left upper lobe nodule with maximum SUV of 5.5. 2. Innumerable scattered small pulmonary nodules, most of which are under 5 mm in diameter, throughout both lungs. Some are cavitary. Possibilities include cavitating hematogenous disseminated malignancy versus infectious etiology subset as septic emboli. Most of the nodules are stable, some are minimally enlarged compared to 10/10/2021. 3. Abnormal mild permeative type bony findings in the right hemipelvis associated with substantially accentuated metabolic activity. Possible associated pathologic fracture through the right quadrilateral plate and right acetabulum. The findings involve the right ilium and ischium but also the right lateral sacral ala and a small focus in the left sacrum.    Pathology Results   She had transbronchial biopsies by Dr. Tamea on 11/19/21.  Pathology from LUL nodule showed adenocarcinoma, consistent with lung primary.    11/19/2021 Cancer Staging   Staging form: Lung, AJCC 8th Edition - Clinical stage from 11/19/2021: Stage IVB (cT1c, cM1c) - Signed by Clista Bimler, MD on 11/27/2021 Stage prefix: Initial diagnosis   11/27/2021 Imaging   MRI pelvis  IMPRESSION: 1. Abnormal marrow signal throughout the  right ilium involving the right acetabulum, right superior pubic ramus and right inferior pubic ramus, right side of the sacrum and a smaller focus of abnormal marrow lesion in the left side of the sacrum. Findings are concerning for metastatic disease. 2. Nondisplaced pathologic fracture of the quadrilateral plate of the right acetabulum. 3. Mild muscle edema in the right gluteus minimus and medius muscles likely reflecting mild muscle strain.  MRI brain  IMPRESSION: No evidence of intracranial metastatic disease.   Indeterminate 1 cm left frontal calvarium lesion.   12/18/2021 - 08/27/2022 Chemotherapy   Patient is on Treatment Plan :  LUNG Carboplatin  (5) + Pemetrexed  (500) + Pembrolizumab  (200) D1 q21d Induction x 4 cycles / Maintenance Pemetrexed  (500) + Pembrolizumab  (200) D1 q21d     10/07/2022 - 01/21/2023 Chemotherapy   Patient is on Treatment Plan : LUNG Carboplatin  + Paclitaxel  q21d Dose Reduction     03/27/2023 -  Chemotherapy   Patient is on Treatment Plan : LUNG Bevacizumab  q21d     Primary malignant neoplasm of left upper lobe of lung (HCC)  06/04/2022 Initial Diagnosis   Primary malignant neoplasm of left upper lobe of lung (HCC)   06/04/2022 Cancer Staging   Staging form: Lung, AJCC 8th Edition - Pathologic: Stage IVB (pT1c, pNX, cM1c) - Signed by Rennie Cindy SAUNDERS, MD on 06/04/2022     PHYSICAL EXAMINATION: ECOG PERFORMANCE STATUS: 2 - Symptomatic, <50% confined to bed  Vitals:   04/24/23 1344 04/24/23 1345  BP: (!) 146/86 (!) 138/90  Pulse: (!) 129   Resp: 16   Temp: 97.6 F (36.4 C)   SpO2: 100%     Filed Weights   04/24/23 1344  Weight: 137 lb (62.1 kg)     Physical Exam Constitutional:      Appearance: Normal appearance.  HENT:     Head: Normocephalic and atraumatic.  Cardiovascular:     Rate and Rhythm: Normal rate.  Pulmonary:     Effort: Pulmonary effort is normal.  Musculoskeletal:     Right lower leg: No edema.     Left lower leg:  No edema.  Skin:    General: Skin is warm.  Neurological:     General: No focal deficit present.     Mental Status: She is oriented to person, place, and time.  Psychiatric:        Mood and Affect: Mood normal.     LABORATORY DATA:  I have reviewed the data as listed    Component Value Date/Time   NA 134 (L) 04/24/2023 1320   K 4.2 04/24/2023 1320   CL 102 04/24/2023 1320   CO2 22 04/24/2023 1320   GLUCOSE 114 (H) 04/24/2023 1320   BUN 27 (H) 04/24/2023 1320   CREATININE 1.41 (H) 04/24/2023 1320   CREATININE 1.33 (H) 02/13/2023 0859   CALCIUM  9.7 04/24/2023 1320   PROT 7.0 04/24/2023 1320   ALBUMIN 3.7 04/24/2023 1320   AST 21 04/24/2023 1320   AST 20 02/13/2023 0859   ALT 12 04/24/2023 1320   ALT 13 02/13/2023 0859   ALKPHOS 188 (H) 04/24/2023 1320   BILITOT 0.5 04/24/2023 1320   BILITOT 0.5 02/13/2023 0859   GFRNONAA 40 (L) 04/24/2023 1320   GFRNONAA 43 (L) 02/13/2023 0859    No results found for: SPEP, UPEP  Lab Results  Component Value Date   WBC 3.9 (L) 04/24/2023   NEUTROABS 2.7 04/24/2023   HGB 10.3 (L) 04/24/2023   HCT 30.7 (L) 04/24/2023   MCV 102.3 (H) 04/24/2023   PLT 120 (L) 04/24/2023      Chemistry      Component Value Date/Time   NA 134 (L) 04/24/2023 1320   K 4.2 04/24/2023 1320   CL 102 04/24/2023 1320   CO2 22 04/24/2023 1320   BUN 27 (H) 04/24/2023 1320   CREATININE 1.41 (H) 04/24/2023 1320   CREATININE 1.33 (H) 02/13/2023 0859      Component Value Date/Time   CALCIUM  9.7 04/24/2023 1320   ALKPHOS 188 (H) 04/24/2023 1320   AST 21 04/24/2023 1320   AST 20 02/13/2023 0859  ALT 12 04/24/2023 1320   ALT 13 02/13/2023 0859   BILITOT 0.5 04/24/2023 1320   BILITOT 0.5 02/13/2023 0859       RADIOGRAPHIC STUDIES: I have personally reviewed the radiological images as listed and agreed with the findings in the report. NM Bone Scan Whole Body Result Date: 04/24/2023 CLINICAL DATA:  Non-small cell lung cancer.  Bone metastasis.  EXAM: NUCLEAR MEDICINE WHOLE BODY BONE SCAN TECHNIQUE: Whole body anterior and posterior images were obtained approximately 3 hours after intravenous injection of radiopharmaceutical. RADIOPHARMACEUTICALS:  22.3 mCi Technetium-76m MDP IV COMPARISON:  12/05/2022 bone scan FINDINGS: Individual skeletal lesions are decreased significantly in radiotracer activity compared to prior. For example broad lesion in the proximal LEFT femur is markedly reduced activity. Lesions in the lumbar spine are decreased in activity. Broad lesion involving the LEFT temporal bone is significantly increased improved. Similar activity in head of the RIGHT humerus. The midshaft LEFT femur humerus lesion similar. IMPRESSION: 1. No evidence of new or progressive skeletal metastasis. 2. Considerable decrease in radiotracer activity of multiple lesions in the axillary appendicular skeleton. Electronically Signed   By: Jackquline Boxer M.D.   On: 04/24/2023 14:02

## 2023-04-30 ENCOUNTER — Ambulatory Visit: Payer: PPO | Admitting: Family Medicine

## 2023-05-06 ENCOUNTER — Encounter: Payer: Self-pay | Admitting: Internal Medicine

## 2023-05-07 ENCOUNTER — Telehealth: Payer: Self-pay | Admitting: *Deleted

## 2023-05-07 ENCOUNTER — Inpatient Hospital Stay (HOSPITAL_BASED_OUTPATIENT_CLINIC_OR_DEPARTMENT_OTHER): Payer: PPO | Admitting: Hospice and Palliative Medicine

## 2023-05-07 DIAGNOSIS — C3490 Malignant neoplasm of unspecified part of unspecified bronchus or lung: Secondary | ICD-10-CM | POA: Diagnosis not present

## 2023-05-07 DIAGNOSIS — G893 Neoplasm related pain (acute) (chronic): Secondary | ICD-10-CM

## 2023-05-07 MED ORDER — OXYCODONE HCL 5 MG PO TABS: 5.0000 mg | ORAL_TABLET | ORAL | 0 refills | Status: DC | PRN

## 2023-05-07 NOTE — Telephone Encounter (Signed)
 Reviewed mychart msg with Dr Alana Hoyle and Jerrilyn Moras. Called patient. She is agreeable to phone call add on with Josh to discuss pain mgmt.

## 2023-05-07 NOTE — Telephone Encounter (Signed)
 Spoke with patient. Pt agreeable to telephone visit today. Her pain is in both hips and upper shoulders. Josh- To clarify, her Left hip surgery was performed in 1970 in [place].

## 2023-05-07 NOTE — Progress Notes (Signed)
 Virtual Visit via Telephone Note  I connected with Victoria Foley on 05/07/23 at 10:15 AM EST by telephone and verified that I am speaking with the correct person using two identifiers.  Location: Patient: Home Provider: Clinic   I discussed the limitations, risks, security and privacy concerns of performing an evaluation and management service by telephone and the availability of in person appointments. I also discussed with the patient that there may be a patient responsible charge related to this service. The patient expressed understanding and agreed to proceed.   History of Present Illness: Victoria Foley is a 73 y.o. 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  Patient is status post RT to the left hip due to nondisplaced pathologic fracture.  She is status post palliative RT to brain.  She is on systemic chemotherapy.    Observations/Objective: Patient was an add-on telephone visit today I Dr. Loann Rily request to discuss pain management.  I called and spoke with patient by phone.  Patient says that she is having bilateral hip and shoulder pain.  Known to have osseous metastatic disease.  Recent considerable decrease in radiotracer activity of multiple skeletal lesions on bone scan 04/10/2023 and no new evidence of progressive skeletal metastasis.  Patient says that with recent cold weather, she has noticed more skeletal aching and pain.  Some days pain is worse on the left side and other days more pronounced on the right.  Pain is worse with movement but she is able to ambulate.  She has been taking tramadol  but finds it no longer effective.  Patient says that she has taken Norco in the past but also did not find it particularly helpful.    Assessment and Plan: Neoplasm related pain -likely secondary to known skeletal metastasis. Discussed options for pain management  in detail with patient.  Will plan to rotate her to oxycodone .  Daily bowel regimen.  Will also obtain x-rays of hip to ensure no pathologic fracture.  Patient has previously undergone XRT.  Discussed with IR but there is no feasibility of RFA.  Case and plan discussed with Dr. Aris Bel  Follow Up Instructions: Follow-up next week with Dr. Aris Bel   I discussed the assessment and treatment plan with the patient. The patient was provided an opportunity to ask questions and all were answered. The patient agreed with the plan and demonstrated an understanding of the instructions.   The patient was advised to call back or seek an in-person evaluation if the symptoms worsen or if the condition fails to improve as anticipated.  I provided 15 minutes of non-face-to-face time during this encounter.   Peggyann Bower, NP

## 2023-05-09 ENCOUNTER — Ambulatory Visit
Admission: RE | Admit: 2023-05-09 | Discharge: 2023-05-09 | Disposition: A | Discharge status: Home / Self Care | Payer: PPO | Source: Ambulatory Visit | Attending: Hospice and Palliative Medicine

## 2023-05-09 ENCOUNTER — Telehealth: Payer: Self-pay

## 2023-05-09 ENCOUNTER — Ambulatory Visit
Admission: RE | Admit: 2023-05-09 | Discharge: 2023-05-09 | Disposition: A | Discharge status: Home / Self Care | Payer: PPO | Source: Ambulatory Visit | Attending: Internal Medicine | Admitting: Internal Medicine

## 2023-05-09 ENCOUNTER — Other Ambulatory Visit: Payer: Self-pay | Admitting: Hospice and Palliative Medicine

## 2023-05-09 ENCOUNTER — Encounter: Payer: Self-pay | Admitting: Internal Medicine

## 2023-05-09 DIAGNOSIS — M25551 Pain in right hip: Secondary | ICD-10-CM | POA: Diagnosis not present

## 2023-05-09 DIAGNOSIS — G893 Neoplasm related pain (acute) (chronic): Secondary | ICD-10-CM | POA: Insufficient documentation

## 2023-05-09 DIAGNOSIS — C7951 Secondary malignant neoplasm of bone: Secondary | ICD-10-CM | POA: Diagnosis not present

## 2023-05-09 DIAGNOSIS — M25552 Pain in left hip: Secondary | ICD-10-CM | POA: Diagnosis not present

## 2023-05-09 DIAGNOSIS — C7931 Secondary malignant neoplasm of brain: Secondary | ICD-10-CM | POA: Insufficient documentation

## 2023-05-09 DIAGNOSIS — C3490 Malignant neoplasm of unspecified part of unspecified bronchus or lung: Secondary | ICD-10-CM

## 2023-05-09 DIAGNOSIS — C719 Malignant neoplasm of brain, unspecified: Secondary | ICD-10-CM | POA: Diagnosis not present

## 2023-05-09 MED ORDER — GADOBUTROL 1 MMOL/ML IV SOLN
6.0000 mL | Freq: Once | INTRAVENOUS | Status: AC | PRN
  Administered 2023-05-09: 6 mL via INTRAVENOUS

## 2023-05-09 MED ORDER — HEPARIN SOD (PORK) LOCK FLUSH 100 UNIT/ML IV SOLN
500.0000 [IU] | Freq: Once | INTRAVENOUS | Status: AC
  Administered 2023-05-09: 500 [IU] via INTRAVENOUS
  Filled 2023-05-09: qty 5

## 2023-05-09 MED ORDER — HEPARIN SOD (PORK) LOCK FLUSH 100 UNIT/ML IV SOLN
INTRAVENOUS | Status: AC
Start: 2023-05-09 — End: ?
  Filled 2023-05-09: qty 5

## 2023-05-09 NOTE — Telephone Encounter (Signed)
Called and spoke with patient in regards to her Dental Clearance message she sent to [place]. I asked her what the name of the dental office is that I need to send the clearance form to. She told me that the name of the dentist is Dr. Kirtland Bouchard at [facility]. Phone number [phone]

## 2023-05-13 ENCOUNTER — Telehealth: Payer: Self-pay

## 2023-05-13 NOTE — Telephone Encounter (Signed)
Spoke with Dr. Jannette Spanner staff in regards to sending over a fax for this patient to have dental clearance for her treatment, and to tell them that Dr. Alena Bills started patient on Avastin so it might not be a good idea for her to have a cleaning, so if we could just get the clearance without patient having a cleaning. I just faxed over the forms and Dr. Alan Ripper last visit note.

## 2023-05-15 ENCOUNTER — Inpatient Hospital Stay: Payer: PPO

## 2023-05-15 ENCOUNTER — Other Ambulatory Visit: Payer: Self-pay

## 2023-05-15 ENCOUNTER — Encounter: Payer: Self-pay | Admitting: *Deleted

## 2023-05-15 ENCOUNTER — Telehealth: Payer: Self-pay | Admitting: *Deleted

## 2023-05-15 ENCOUNTER — Inpatient Hospital Stay: Payer: PPO | Admitting: Internal Medicine

## 2023-05-15 ENCOUNTER — Encounter: Payer: Self-pay | Admitting: Internal Medicine

## 2023-05-15 VITALS — BP 134/78 | HR 118 | Temp 98.1°F | Resp 17 | Ht 59.0 in | Wt 138.4 lb

## 2023-05-15 DIAGNOSIS — M25552 Pain in left hip: Secondary | ICD-10-CM

## 2023-05-15 DIAGNOSIS — K5903 Drug induced constipation: Secondary | ICD-10-CM

## 2023-05-15 DIAGNOSIS — G893 Neoplasm related pain (acute) (chronic): Secondary | ICD-10-CM | POA: Insufficient documentation

## 2023-05-15 DIAGNOSIS — M25551 Pain in right hip: Secondary | ICD-10-CM

## 2023-05-15 DIAGNOSIS — C3490 Malignant neoplasm of unspecified part of unspecified bronchus or lung: Secondary | ICD-10-CM

## 2023-05-15 DIAGNOSIS — C7931 Secondary malignant neoplasm of brain: Secondary | ICD-10-CM

## 2023-05-15 DIAGNOSIS — Z5111 Encounter for antineoplastic chemotherapy: Secondary | ICD-10-CM | POA: Diagnosis not present

## 2023-05-15 DIAGNOSIS — M25512 Pain in left shoulder: Secondary | ICD-10-CM

## 2023-05-15 DIAGNOSIS — M25511 Pain in right shoulder: Secondary | ICD-10-CM | POA: Diagnosis not present

## 2023-05-15 DIAGNOSIS — K5900 Constipation, unspecified: Secondary | ICD-10-CM | POA: Insufficient documentation

## 2023-05-15 DIAGNOSIS — G8929 Other chronic pain: Secondary | ICD-10-CM

## 2023-05-15 LAB — CBC WITH DIFFERENTIAL (CANCER CENTER ONLY)
Abs Immature Granulocytes: 0.06 10*3/uL (ref 0.00–0.07)
Basophils Absolute: 0 10*3/uL (ref 0.0–0.1)
Basophils Relative: 0 %
Eosinophils Absolute: 0 10*3/uL (ref 0.0–0.5)
Eosinophils Relative: 0 %
HCT: 29.5 % — ABNORMAL LOW (ref 36.0–46.0)
Hemoglobin: 10.1 g/dL — ABNORMAL LOW (ref 12.0–15.0)
Immature Granulocytes: 1 %
Lymphocytes Relative: 7 %
Lymphs Abs: 0.8 10*3/uL (ref 0.7–4.0)
MCH: 33.3 pg (ref 26.0–34.0)
MCHC: 34.2 g/dL (ref 30.0–36.0)
MCV: 97.4 fL (ref 80.0–100.0)
Monocytes Absolute: 0.8 10*3/uL (ref 0.1–1.0)
Monocytes Relative: 7 %
Neutro Abs: 9 10*3/uL — ABNORMAL HIGH (ref 1.7–7.7)
Neutrophils Relative %: 85 %
Platelet Count: 130 10*3/uL — ABNORMAL LOW (ref 150–400)
RBC: 3.03 MIL/uL — ABNORMAL LOW (ref 3.87–5.11)
RDW: 11.7 % (ref 11.5–15.5)
WBC Count: 10.7 10*3/uL — ABNORMAL HIGH (ref 4.0–10.5)
nRBC: 0 % (ref 0.0–0.2)

## 2023-05-15 LAB — CMP (CANCER CENTER ONLY)
ALT: 13 U/L (ref 0–44)
AST: 22 U/L (ref 15–41)
Albumin: 3.6 g/dL (ref 3.5–5.0)
Alkaline Phosphatase: 170 U/L — ABNORMAL HIGH (ref 38–126)
Anion gap: 10 (ref 5–15)
BUN: 25 mg/dL — ABNORMAL HIGH (ref 8–23)
CO2: 22 mmol/L (ref 22–32)
Calcium: 9.6 mg/dL (ref 8.9–10.3)
Chloride: 100 mmol/L (ref 98–111)
Creatinine: 1.41 mg/dL — ABNORMAL HIGH (ref 0.44–1.00)
GFR, Estimated: 40 mL/min — ABNORMAL LOW (ref >60)
Glucose, Bld: 122 mg/dL — ABNORMAL HIGH (ref 70–99)
Potassium: 4 mmol/L (ref 3.5–5.1)
Sodium: 132 mmol/L — ABNORMAL LOW (ref 135–145)
Total Bilirubin: 1 mg/dL (ref 0.0–1.2)
Total Protein: 7.2 g/dL (ref 6.5–8.1)

## 2023-05-15 MED ORDER — HEPARIN SOD (PORK) LOCK FLUSH 100 UNIT/ML IV SOLN
500.0000 [IU] | Freq: Once | INTRAVENOUS | Status: AC
  Administered 2023-05-15: 500 [IU] via INTRAVENOUS
  Filled 2023-05-15: qty 5

## 2023-05-15 MED ORDER — LACTULOSE 10 GM/15ML PO SOLN: 10.0000 g | Freq: Three times a day (TID) | ORAL | 0 refills | Status: DC

## 2023-05-15 NOTE — Telephone Encounter (Signed)
Left vm x 2 for patient regarding the pet scan (pt and daughter phone). Also sent mychart msg.   Patient and Daughter called back. Discussed pet scan apt and time and prep. Pt accepted apt.  Of note, dental clearance obtained for patient's xgeva.

## 2023-05-15 NOTE — Patient Instructions (Signed)
To improve your constipation, please take 2 Senna (8.6 mg) daily) and 2 doses of Miralax (daily). If the constipation medications do not work, You may take Lactulose 10 gm/15 ml- up to three times a days until a Bowel movement occurs.

## 2023-05-15 NOTE — Telephone Encounter (Signed)
Dental clearance rcvd. Pt aware.

## 2023-05-15 NOTE — Progress Notes (Addendum)
[facility] OFFICE PROGRESS NOTE  Patient Care Team: Dana Allan, MD as PCP - General (Family Medicine) Glory Buff, RN as Oncology Nurse Navigator Michaelyn Barter, MD as Consulting Physician (Oncology) Sondra Come, MD as Consulting Physician (Urology)  TREATMENT:  Right hip palliative RT 30 cGy completed 12/18/2021 Carboplatin, alimta and Keytruda x 4 cycles completed 02/19/22.  Discontinued maintenance alimta (04-23-22) due to worsening renal dysfunction, transaminitis and cytopenias Keytruda maintenance-discontinued on 09/17/2022 due to disease progression Palliative RT to left frontal lesion 5 fx completed 09/27/22 Palliative RT to left hip completed September 2024 Carboplatin (AUC 4) and paclitaxel 150 mg/m2 - started 10/07/2022- 01/21/2023. Maintenance Avastin 15 mg/kg-started 03/27/2023  ASSESSMENT & PLAN:  # Primary lung adenocarcinoma (HCC), Stage IV, PDL1 1% -Progressed through first-line [place] Alimta, Keytruda and then [place] maintenance on 09/17/2022.  -s/p second line carbo AUC 4 and Taxol 150 mg/m on 10/07/2022.  Completed 5 cycles and then discontinued due to severe cytopenias.  Patient agreed for maintenance Avastin (was not added with chemo because patient was hesitant).  CBC with differential and urine protein dipstick reviewed.   -Foundation 1 from August 2023 no targets.  Liquid foundation 1 CDX from June 2024 with no targetable mutation.  Repeat lung biopsy on 12/17/2022 sent for foundation 1 CDX testing was limited due to inadequate sample but no targets seen.  RNA fusion panel was sent on primary biopsy from August 2023.  It could not be run due to low tumor purity.  -On maintenance Avastin 15 mg/kg every 3 weeks started on 03/27/2023.  Patient having increasing pain in the bilateral hips and bilateral shoulder.  We repeated bone scan which showed decreased radiotracer activity with no new mets.  She had x-rays of bilateral hip which did not show acute  fracture.  She was seen by palliative care and pain medication was switched from tramadol to oxycodone 5 mg which she is taking 3 times a day.  Still in a lot of pain and not able to move around much.  We reviewed the side effect profile of Avastin and in about 20 to 40% of the cases it can cause worsening arthralgias.  Will hold the treatment today and reassess in 3 weeks.  She is scheduled for CT chest abdomen pelvis on February 3.  I am going to change that to a PET scan for closer evaluation for her bone pain.  Also, patient requesting if there be more role for radiation.  I will reach out to Dr. Rushie Chestnut.  If PET does not give any answer for increasing pain, patient would like to obtain MRI of bilateral hip and shoulder.  -Continue with care coordination with [facility] Kemper Durie at [facility].    # Constipation -Advised to increase Senokot to 2 tabs and MiraLAX to twice a day. -Sent a prescription for lactulose as needed.  # Cancer-related pain -Currently on oxycodone 5 mg every 8 as needed.  I discussed about adding a long acting opioid which she is resistant to.  For now I advised to increase the oxycodone to every 4-6 hours as needed.  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   -Follow-up with Dr. Barbaraann Cao. IMPRESSION:  Treated metastatic lesion at the inferior left temporal occipital junction today measures 13 mm front to back, 11 mm right to left, with a thickness of 3.5 mm. This is slightly larger in diameter than was  seen on the study of 01/03/2023. However, looking at this on multiple prior exams, including the study of 09/10/2022, though the overall axial plane diameter is increased by few mm, the cephalo caudal thickness is diminished from 5 mm on the 09/10/2022 study. Therefore, what we are seeing today could be treatment effect, but this area should be observed on  subsequent studies.  - Plan to repeat MRI brain in 3 months  # Chemotherapy induced peripheral neuropathy -Stable.  Started on gabapentin 100 mg 3 times daily, taper as tolerated.  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 Cr stabilized.  -will continue to monitor.  # Chemotherapy-induced anemia  -Aranesp 300 mcg as needed.   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 Aggie Cosier on 12/18/2021 -Completed palliative RT to left hip on 01/01/2023.   -Was seen by [facility].  No surgical intervention needed.  # Metastatic bone lesions -Obtain dental clearance.  Will add Xgeva for next visit.  # Access - port  RTC in 3 weeks for MD visit, labs, Avastin  Orders Placed This Encounter  Procedures   NM PET Image Restag (PS) Skull Base To Thigh    Standing Status:   Future    Expected Date:   05/15/2023    Expiration Date:   05/14/2024    If indicated for the ordered procedure, I authorize the administration of a radiopharmaceutical per Radiology protocol:   Yes    Preferred imaging location?:   [facility]   MR HIP RIGHT W CONTRAST    Standing Status:   Future    Expected Date:   05/22/2023    Expiration Date:   05/14/2024    Reason for Exam (SYMPTOM  OR DIAGNOSIS REQUIRED):   bilateral hip pain, lung cancer; evaluate for metastatic disease    If indicated for the ordered procedure, I authorize the administration of contrast media per Radiology protocol:   Yes    What is the patient's sedation requirement?:   No Sedation    Does the patient have a pacemaker or implanted devices?:   No    Preferred imaging location?:   [facility] (table limit - 550lbs)   MR SHOULDER LEFT W CONTRAST    Standing Status:   Future    Expected Date:   05/22/2023    Expiration Date:   05/14/2024    Reason for Exam (SYMPTOM  OR DIAGNOSIS REQUIRED):   bilateral shoulder pain, lung cancer, evaluate for metastatic disease    If indicated for the ordered procedure, I  authorize the administration of contrast media per Radiology protocol:   Yes    What is the patient's sedation requirement?:   No Sedation    Does the patient have a pacemaker or implanted devices?:   No    Preferred imaging location?:   [facility] (table limit - 550lbs)   MR SHOULDER RIGHT W CONTRAST    Standing Status:   Future    Expected Date:   05/22/2023    Expiration Date:   05/14/2024    Reason for Exam (SYMPTOM  OR DIAGNOSIS REQUIRED):   bilateral shoulder pain, lung cancer, evaluate for metastatic disease    If indicated for the ordered procedure, I authorize the administration of contrast media per Radiology protocol:   Yes    What is the patient's sedation requirement?:   No Sedation    Does the patient have a pacemaker or implanted devices?:   No  Preferred imaging location?:   [facility] (table limit - 550lbs)   MR HIP LEFT W WO CONTRAST    Standing Status:   Future    Expected Date:   05/22/2023    Expiration Date:   05/14/2024    If indicated for the ordered procedure, I authorize the administration of contrast media per Radiology protocol:   Yes    What is the patient's sedation requirement?:   No Sedation    Does the patient have a pacemaker or implanted devices?:   No    Preferred imaging location?:   [facility] (table limit - 550lbs)   CBC with Differential (Cancer Center Only)    Standing Status:   Future    Expected Date:   06/05/2023    Expiration Date:   05/14/2024   CMP (Cancer Center only)    Standing Status:   Future    Expected Date:   06/05/2023    Expiration Date:   05/14/2024    [facility]- 12/06/2021   All questions were answered. The patient knows to call the clinic with any problems, questions or concerns. The total time spent in the appointment was 30 minutes encounter with patients including review of chart and various tests results, discussions about plan of care and coordination of care plan   Michaelyn Barter, MD 05/15/2023  4:49 PM  INTERVAL HISTORY: Please see below for problem oriented charting.  Patient following with [place] for treatment of stage IV lung adenocarcinoma.   Patient seen today accompanied with daughter prior to  Avastin, labs. Patient having increasingly pain in bilateral hip and shoulder.  Significantly affecting her mobility.  She is taking oxycodone every 8 hour.  Not had bowel movement for a week.  REVIEW OF SYSTEMS:   Positive ROS as above.  Rest 10 points ROS negative   I have reviewed the past medical history, past surgical history, social history and family history with the patient and they are unchanged from previous note.  ALLERGIES:  is allergic to diclofenac, lisinopril, and sulfa antibiotics.  MEDICATIONS:  Current Outpatient Medications  Medication Sig Dispense Refill   albuterol (VENTOLIN HFA) 108 (90 Base) MCG/ACT inhaler Inhale 2 puffs into the lungs every 6 (six) hours as needed for wheezing or shortness of breath. 18 g 0   Azelastine HCl 137 MCG/SPRAY SOLN Place 2 sprays into both nostrils 2 (two) times daily.     Cholecalciferol (VITAMIN D-3 PO) Take by mouth.     dexamethasone (DECADRON) 4 MG tablet Take 1 tablet (4 mg total) by mouth daily. Take for 3 days after chemotherapy 30 tablet 0   fluticasone (FLONASE) 50 MCG/ACT nasal spray USE TWO SPRAYS IN EACH NOSTRIL ONCE DAILY prn 16 g 12   lactulose (CHRONULAC) 10 GM/15ML solution Take 15 mLs (10 g total) by mouth 3 (three) times daily. 236 mL 0   loratadine (CLARITIN) 10 MG tablet TAKE ONE TABLET BY MOUTH DAILY AS NEEDED FOR ALLERGIES 90 tablet 3   losartan (COZAAR) 100 MG tablet Take 1 tablet (100 mg total) by mouth daily. 90 tablet 3   magic mouthwash w/lidocaine SOLN Take 5 mLs by mouth 4 (four) times daily as needed for mouth pain. Sig: Swish/Swallow 5-10 ml four times a day as needed. Dispense 480 ml. 1RF 480 mL 1   MULTIPLE VITAMIN PO Take 1 tablet by mouth daily.     ondansetron (ZOFRAN) 8 MG tablet Take 1 tablet (8  mg total) by mouth every 8 (eight)  hours as needed for nausea or vomiting. 30 tablet 3   oxyCODONE (OXY IR/ROXICODONE) 5 MG immediate release tablet Take 1 tablet (5 mg total) by mouth every 4 (four) hours as needed for severe pain (pain score 7-10). 45 tablet 0   senna (SENOKOT) 8.6 MG TABS tablet Take 1 tablet by mouth daily.     valACYclovir (VALTREX) 500 MG tablet Take 1 tablet (500 mg total) by mouth 2 (two) times daily. For shingles prophylaxis 60 tablet 1   gabapentin (NEURONTIN) 100 MG capsule Take 1 capsule (100 mg total) by mouth 3 (three) times daily. 90 capsule 0   No current facility-administered medications for this visit.   Facility-Administered Medications Ordered in Other Visits  Medication Dose Route Frequency Provider Last Rate Last Admin   heparin lock flush 100 UNIT/ML injection            heparin lock flush 100 unit/mL  500 Units Intravenous Once Michaelyn Barter, MD       sodium chloride flush (NS) 0.9 % injection 10 mL  10 mL Intravenous Once Michaelyn Barter, MD        SUMMARY OF ONCOLOGIC HISTORY: Oncology History  Primary lung adenocarcinoma (HCC)  10/10/2021 Initial Diagnosis   Primary lung adenocarcinoma (HCC) Stage IV  Patient seen by PCP for wheezing for 2 weeks. Imaging with CXR followed by CT chest showed Spiculated 2.1 x 2.2 cm left upper lobe pulmonary nodule with associated pleural tethering. Innumerable subcentimeter pulmonary nodules throughout the lungs.      11/14/2021 PET scan   IMPRESSION: 1. Left suprahilar nodule with maximum SUV 4.5, and a more distal 2.2 cm left upper lobe nodule with maximum SUV of 5.5. 2. Innumerable scattered small pulmonary nodules, most of which are under 5 mm in diameter, throughout both lungs. Some are cavitary. Possibilities include cavitating hematogenous disseminated malignancy versus infectious etiology subset as septic emboli. Most of the nodules are stable, some are minimally enlarged compared to 10/10/2021. 3.  Abnormal mild permeative type bony findings in the right hemipelvis associated with substantially accentuated metabolic activity. Possible associated pathologic fracture through the right quadrilateral plate and right acetabulum. The findings involve the right ilium and ischium but also the right lateral sacral ala and a small focus in the left sacrum.    Pathology Results   She had transbronchial biopsies by Dr. Jayme Cloud on 11/19/21.  Pathology from LUL nodule showed adenocarcinoma, consistent with lung primary.    11/19/2021 Cancer Staging   Staging form: Lung, AJCC 8th Edition - Clinical stage from 11/19/2021: Stage IVB (cT1c, cM1c) - Signed by Michaelyn Barter, MD on 11/27/2021 Stage prefix: Initial diagnosis   11/27/2021 Imaging   MRI pelvis  IMPRESSION: 1. Abnormal marrow signal throughout the right ilium involving the right acetabulum, right superior pubic ramus and right inferior pubic ramus, right side of the sacrum and a smaller focus of abnormal marrow lesion in the left side of the sacrum. Findings are concerning for metastatic disease. 2. Nondisplaced pathologic fracture of the quadrilateral plate of the right acetabulum. 3. Mild muscle edema in the right gluteus minimus and medius muscles likely reflecting mild muscle strain.  MRI brain  IMPRESSION: No evidence of intracranial metastatic disease.   Indeterminate 1 cm left frontal calvarium lesion.   12/18/2021 - 08/27/2022 Chemotherapy   Patient is on Treatment Plan : LUNG Carboplatin (5) + Pemetrexed (500) + Pembrolizumab (200) D1 q21d Induction x 4 cycles / Maintenance Pemetrexed (500) + Pembrolizumab (200) D1 q21d  10/07/2022 - 01/21/2023 Chemotherapy   Patient is on Treatment Plan : LUNG Carboplatin + Paclitaxel q21d Dose Reduction     03/27/2023 -  Chemotherapy   Patient is on Treatment Plan : LUNG Bevacizumab q21d     Primary malignant neoplasm of left upper lobe of lung (HCC)  06/04/2022 Initial Diagnosis    Primary malignant neoplasm of left upper lobe of lung (HCC)   06/04/2022 Cancer Staging   Staging form: Lung, AJCC 8th Edition - Pathologic: Stage IVB (pT1c, pNX, cM1c) - Signed by Earna Coder, MD on 06/04/2022     PHYSICAL EXAMINATION: ECOG PERFORMANCE STATUS: 2 - Symptomatic, <50% confined to bed  Vitals:   05/15/23 1450  BP: 134/78  Pulse: (!) 118  Resp: 17  Temp: 98.1 F (36.7 C)    Filed Weights   05/15/23 1450  Weight: 138 lb 7.2 oz (62.8 kg)     Physical Exam Constitutional:      Appearance: Normal appearance.  HENT:     Head: Normocephalic and atraumatic.  Cardiovascular:     Rate and Rhythm: Normal rate.  Pulmonary:     Effort: Pulmonary effort is normal.  Musculoskeletal:     Right lower leg: No edema.     Left lower leg: No edema.  Skin:    General: Skin is warm.  Neurological:     General: No focal deficit present.     Mental Status: She is oriented to person, place, and time.  Psychiatric:        Mood and Affect: Mood normal.     LABORATORY DATA:  I have reviewed the data as listed    Component Value Date/Time   NA 132 (L) 05/15/2023 1459   K 4.0 05/15/2023 1459   CL 100 05/15/2023 1459   CO2 22 05/15/2023 1459   GLUCOSE 122 (H) 05/15/2023 1459   BUN 25 (H) 05/15/2023 1459   CREATININE 1.41 (H) 05/15/2023 1459   CALCIUM 9.6 05/15/2023 1459   PROT 7.2 05/15/2023 1459   ALBUMIN 3.6 05/15/2023 1459   AST 22 05/15/2023 1459   ALT 13 05/15/2023 1459   ALKPHOS 170 (H) 05/15/2023 1459   BILITOT 1.0 05/15/2023 1459   GFRNONAA 40 (L) 05/15/2023 1459    No results found for: "SPEP", "UPEP"  Lab Results  Component Value Date   WBC 10.7 (H) 05/15/2023   NEUTROABS 9.0 (H) 05/15/2023   HGB 10.1 (L) 05/15/2023   HCT 29.5 (L) 05/15/2023   MCV 97.4 05/15/2023   PLT 130 (L) 05/15/2023      Chemistry      Component Value Date/Time   NA 132 (L) 05/15/2023 1459   K 4.0 05/15/2023 1459   CL 100 05/15/2023 1459   CO2 22 05/15/2023  1459   BUN 25 (H) 05/15/2023 1459   CREATININE 1.41 (H) 05/15/2023 1459      Component Value Date/Time   CALCIUM 9.6 05/15/2023 1459   ALKPHOS 170 (H) 05/15/2023 1459   AST 22 05/15/2023 1459   ALT 13 05/15/2023 1459   BILITOT 1.0 05/15/2023 1459       RADIOGRAPHIC STUDIES: I have personally reviewed the radiological images as listed and agreed with the findings in the report. DG HIPS BILAT WITH PELVIS MIN 5 VIEWS Result Date: 05/09/2023 CLINICAL DATA:  Bilateral hip pain.  History of osseous metastases. EXAM: DG HIP (WITH OR WITHOUT PELVIS) 5+V BILAT COMPARISON:  February 24, 2023. FINDINGS: There is no evidence of hip fracture or dislocation. Moderate  degenerative changes are seen involving both hip joints. Status post intramedullary rod fixation of left femur. Mixed lucent and sclerotic lesions seen in left femoral head consistent with metastatic lesion. Sclerosis is seen involving right ischial tuberosity suggesting metastatic disease. 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 No acute fracture or dislocation. Osseous metastases are again noted. Electronically Signed   By: Lupita Raider M.D.   On: 05/09/2023 13:24   MR BRAIN W WO CONTRAST Result Date: 05/09/2023 CLINICAL DATA:  Brain/CNS neoplasm.  Assess treatment response. EXAM: MRI HEAD WITHOUT AND WITH CONTRAST TECHNIQUE: Multiplanar, multiecho pulse sequences of the brain and surrounding structures were obtained without and with intravenous contrast. CONTRAST:  6mL GADAVIST GADOBUTROL 1 MMOL/ML IV SOLN COMPARISON:  01/03/2023 and multiple previous FINDINGS: Brain: Diffusion imaging does not show any acute or subacute infarction or other cause of restricted diffusion. No abnormality is seen affecting the brainstem or cerebellum. Treated metastatic lesion at the inferior left temporal occipital junction today measures 13 mm front to back, 11 mm right to left, with a thickness of 3.5 mm. This is slightly larger  than was seen on the study of 01/03/2023. However, looking at this on multiple prior exams, including the study of 09/10/2022, though the overall diameter is increased by few mm, the cephalo caudal thickness is diminished from 5 mm on the 09/10/2022 study. Therefore, what we are seeing today could be treatment effect, but this area should be observed on subsequent studies. No new or definitely progressive parenchymal metastatic lesion is seen. Mild dural enhancement adjacent to the left frontal bone disease described below appears similar. No hydrocephalus or extra-axial fluid collection. Minimal small vessel change of the white matter is unchanged. Vascular: Major vessels at the base of the brain show flow. Skull and upper cervical spine: Some contraction of the abnormal signal associated with the clivus. No change in the appearance of the left frontal calvarial disease. Sinuses/Orbits: Clear/normal Other: None IMPRESSION: 1. Treated metastatic lesion at the inferior left temporal occipital junction today measures 13 mm front to back, 11 mm right to left, with a thickness of 3.5 mm. This is slightly larger in diameter than was seen on the study of 01/03/2023. However, looking at this on multiple prior exams, including the study of 09/10/2022, though the overall axial plane diameter is increased by few mm, the cephalo caudal thickness is diminished from 5 mm on the 09/10/2022 study. Therefore, what we are seeing today could be treatment effect, but this area should be observed on subsequent studies. 2. No new or progressive parenchymal metastatic lesion is seen elsewhere. 3. Some contraction of the abnormal signal associated with the clivus. No change in the appearance of the left frontal calvarial disease. Electronically Signed   By: Paulina Fusi M.D.   On: 05/09/2023 12:07

## 2023-05-15 NOTE — Progress Notes (Signed)
Last bm a week. Pt is taking laxatives. Patient taking miralax and senna daily. Patient taking senna 8.6 mg daily and 1 dose of miralax daily. Patient c/o shoulder and back which is effecting her mobility. Patient not eating/drinking plenty of fluids per daughter.

## 2023-05-16 ENCOUNTER — Other Ambulatory Visit: Payer: Self-pay | Admitting: *Deleted

## 2023-05-16 ENCOUNTER — Ambulatory Visit: Payer: PPO | Admitting: Internal Medicine

## 2023-05-16 ENCOUNTER — Inpatient Hospital Stay (HOSPITAL_BASED_OUTPATIENT_CLINIC_OR_DEPARTMENT_OTHER): Payer: PPO | Admitting: Internal Medicine

## 2023-05-16 ENCOUNTER — Encounter
Admission: RE | Admit: 2023-05-16 | Discharge: 2023-05-16 | Disposition: A | Discharge status: Home / Self Care | Payer: PPO | Source: Ambulatory Visit | Attending: Internal Medicine | Admitting: Internal Medicine

## 2023-05-16 ENCOUNTER — Other Ambulatory Visit: Payer: Self-pay | Admitting: Internal Medicine

## 2023-05-16 ENCOUNTER — Encounter: Payer: Self-pay | Admitting: Internal Medicine

## 2023-05-16 DIAGNOSIS — C7931 Secondary malignant neoplasm of brain: Secondary | ICD-10-CM

## 2023-05-16 DIAGNOSIS — C7951 Secondary malignant neoplasm of bone: Secondary | ICD-10-CM | POA: Diagnosis not present

## 2023-05-16 DIAGNOSIS — M25552 Pain in left hip: Secondary | ICD-10-CM | POA: Insufficient documentation

## 2023-05-16 DIAGNOSIS — C3490 Malignant neoplasm of unspecified part of unspecified bronchus or lung: Secondary | ICD-10-CM

## 2023-05-16 DIAGNOSIS — M25551 Pain in right hip: Secondary | ICD-10-CM | POA: Diagnosis not present

## 2023-05-16 LAB — GLUCOSE, CAPILLARY: Glucose-Capillary: 52 mg/dL — ABNORMAL LOW (ref 70–99)

## 2023-05-16 MED ORDER — FLUDEOXYGLUCOSE F - 18 (FDG) INJECTION
7.5800 | Freq: Once | INTRAVENOUS | Status: AC | PRN
  Administered 2023-05-16: 7.58 via INTRAVENOUS

## 2023-05-16 NOTE — Progress Notes (Signed)
I connected with Victoria Foley on 05/16/23 at  9:00 AM EST by telephone visit and verified that I am speaking with the correct person using two identifiers.  I discussed the limitations, risks, security and privacy concerns of performing an evaluation and management service by telemedicine and the availability of in-person appointments. I also discussed with the patient that there may be a patient responsible charge related to this service. The patient expressed understanding and agreed to proceed.   Other persons participating in the visit and their role in the encounter:  n/a  Patient's location:  Home Provider's location:  Office Chief Complaint:  Metastasis to brain [facility])  History of Present Ilness: Victoria Foley reports no clinical changes today.  Denies seizurues, headaches.  Continues to undergo chemotherapy, avastin with Dr. Alena Bills.  Observations: Language and cognition at baseline  Imaging:  [facility] Clinician Interpretation: I have personally reviewed the CNS images as listed.  My interpretation, in the context of the patient's clinical presentation, is progressive disease  DG HIPS BILAT WITH PELVIS MIN 5 VIEWS Result Date: 05/09/2023 CLINICAL DATA:  Bilateral hip pain.  History of osseous metastases. EXAM: DG HIP (WITH OR WITHOUT PELVIS) 5+V BILAT COMPARISON:  February 24, 2023. FINDINGS: There is no evidence of hip fracture or dislocation. Moderate degenerative changes are seen involving both hip joints. Status post intramedullary rod fixation of left femur. Mixed lucent and sclerotic lesions seen in left femoral head consistent with metastatic lesion. Sclerosis is seen involving right ischial tuberosity suggesting metastatic disease. 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 No acute fracture or dislocation. Osseous metastases are again noted. Electronically Signed   By: Lupita Raider M.D.   On: 05/09/2023 13:24   MR BRAIN W WO CONTRAST Result Date:  05/09/2023 CLINICAL DATA:  Brain/CNS neoplasm.  Assess treatment response. EXAM: MRI HEAD WITHOUT AND WITH CONTRAST TECHNIQUE: Multiplanar, multiecho pulse sequences of the brain and surrounding structures were obtained without and with intravenous contrast. CONTRAST:  6mL GADAVIST GADOBUTROL 1 MMOL/ML IV SOLN COMPARISON:  01/03/2023 and multiple previous FINDINGS: Brain: Diffusion imaging does not show any acute or subacute infarction or other cause of restricted diffusion. No abnormality is seen affecting the brainstem or cerebellum. Treated metastatic lesion at the inferior left temporal occipital junction today measures 13 mm front to back, 11 mm right to left, with a thickness of 3.5 mm. This is slightly larger than was seen on the study of 01/03/2023. However, looking at this on multiple prior exams, including the study of 09/10/2022, though the overall diameter is increased by few mm, the cephalo caudal thickness is diminished from 5 mm on the 09/10/2022 study. Therefore, what we are seeing today could be treatment effect, but this area should be observed on subsequent studies. No new or definitely progressive parenchymal metastatic lesion is seen. Mild dural enhancement adjacent to the left frontal bone disease described below appears similar. No hydrocephalus or extra-axial fluid collection. Minimal small vessel change of the white matter is unchanged. Vascular: Major vessels at the base of the brain show flow. Skull and upper cervical spine: Some contraction of the abnormal signal associated with the clivus. No change in the appearance of the left frontal calvarial disease. Sinuses/Orbits: Clear/normal Other: None IMPRESSION: 1. Treated metastatic lesion at the inferior left temporal occipital junction today measures 13 mm front to back, 11 mm right to left, with a thickness of 3.5 mm. This is slightly larger in diameter than was seen on the study of  01/03/2023. However, looking at this on multiple prior  exams, including the study of 09/10/2022, though the overall axial plane diameter is increased by few mm, the cephalo caudal thickness is diminished from 5 mm on the 09/10/2022 study. Therefore, what we are seeing today could be treatment effect, but this area should be observed on subsequent studies. 2. No new or progressive parenchymal metastatic lesion is seen elsewhere. 3. Some contraction of the abnormal signal associated with the clivus. No change in the appearance of the left frontal calvarial disease. Electronically Signed   By: Paulina Fusi M.D.   On: 05/09/2023 12:07   Assessment and Plan: Metastasis to brain [facility])  Victoria Foley is clinically stable today.  MRI brain demonstrates progression of yet untreated left temporal metastasis.    We discussed and recommended re-referral to radiation oncology for focal radiation.  She is agreeable with this.  No corticosteroids for now given lack of symptoms.  Follow Up Instructions: We ask that Victoria Foley return to clinic in 3 months following next brain MRI, or sooner as needed.  I discussed the assessment and treatment plan with the patient.  The patient was provided an opportunity to ask questions and all were answered.  The patient agreed with the plan and demonstrated understanding of the instructions.    The patient was advised to call back or seek an in-person evaluation if the symptoms worsen or if the condition fails to improve as anticipated.    Henreitta Leber, MD   I provided 20 minutes of non face-to-face telephone visit time during this encounter, and > 50% was spent counseling as documented under my assessment & plan.

## 2023-05-16 NOTE — Addendum Note (Signed)
Addended by: Ashley Royalty A on: 05/16/2023 03:41 PM   Modules accepted: Orders

## 2023-05-16 NOTE — Addendum Note (Signed)
Addended by: Henreitta Leber on: 05/16/2023 10:14 AM   Modules accepted: Orders

## 2023-05-20 ENCOUNTER — Ambulatory Visit: Payer: PPO | Admitting: Family Medicine

## 2023-05-20 ENCOUNTER — Ambulatory Visit
Admission: RE | Admit: 2023-05-20 | Discharge: 2023-05-20 | Disposition: A | Discharge status: Home / Self Care | Payer: PPO | Source: Ambulatory Visit | Attending: Internal Medicine | Admitting: Internal Medicine

## 2023-05-20 DIAGNOSIS — G8929 Other chronic pain: Secondary | ICD-10-CM | POA: Insufficient documentation

## 2023-05-20 DIAGNOSIS — C3490 Malignant neoplasm of unspecified part of unspecified bronchus or lung: Secondary | ICD-10-CM | POA: Insufficient documentation

## 2023-05-20 DIAGNOSIS — M84459A Pathological fracture, hip, unspecified, initial encounter for fracture: Secondary | ICD-10-CM | POA: Diagnosis not present

## 2023-05-20 DIAGNOSIS — M25552 Pain in left hip: Secondary | ICD-10-CM | POA: Diagnosis not present

## 2023-05-20 DIAGNOSIS — M25411 Effusion, right shoulder: Secondary | ICD-10-CM | POA: Diagnosis not present

## 2023-05-20 DIAGNOSIS — M25551 Pain in right hip: Secondary | ICD-10-CM

## 2023-05-20 DIAGNOSIS — M19012 Primary osteoarthritis, left shoulder: Secondary | ICD-10-CM | POA: Diagnosis not present

## 2023-05-20 DIAGNOSIS — M25452 Effusion, left hip: Secondary | ICD-10-CM | POA: Diagnosis not present

## 2023-05-20 DIAGNOSIS — M7582 Other shoulder lesions, left shoulder: Secondary | ICD-10-CM | POA: Diagnosis not present

## 2023-05-20 DIAGNOSIS — M25451 Effusion, right hip: Secondary | ICD-10-CM | POA: Diagnosis not present

## 2023-05-20 DIAGNOSIS — M25511 Pain in right shoulder: Secondary | ICD-10-CM | POA: Diagnosis not present

## 2023-05-20 DIAGNOSIS — M25512 Pain in left shoulder: Secondary | ICD-10-CM | POA: Insufficient documentation

## 2023-05-20 DIAGNOSIS — M7581 Other shoulder lesions, right shoulder: Secondary | ICD-10-CM | POA: Diagnosis not present

## 2023-05-20 DIAGNOSIS — M19011 Primary osteoarthritis, right shoulder: Secondary | ICD-10-CM | POA: Diagnosis not present

## 2023-05-20 MED ORDER — GADOBUTROL 1 MMOL/ML IV SOLN
6.0000 mL | Freq: Once | INTRAVENOUS | Status: AC | PRN
  Administered 2023-05-20: 6 mL via INTRAVENOUS

## 2023-05-21 ENCOUNTER — Encounter: Payer: Self-pay | Admitting: Internal Medicine

## 2023-05-21 ENCOUNTER — Encounter: Payer: Self-pay | Admitting: Family Medicine

## 2023-05-22 ENCOUNTER — Encounter: Payer: Self-pay | Admitting: Internal Medicine

## 2023-05-22 ENCOUNTER — Ambulatory Visit
Admission: RE | Admit: 2023-05-22 | Discharge: 2023-05-22 | Disposition: A | Discharge status: Home / Self Care | Payer: PPO | Source: Ambulatory Visit | Attending: Radiation Oncology | Admitting: Radiation Oncology

## 2023-05-22 ENCOUNTER — Inpatient Hospital Stay: Payer: PPO | Admitting: Hospice and Palliative Medicine

## 2023-05-22 ENCOUNTER — Inpatient Hospital Stay: Payer: PPO

## 2023-05-22 ENCOUNTER — Telehealth: Payer: Self-pay | Admitting: *Deleted

## 2023-05-22 VITALS — BP 130/72 | HR 114 | Temp 97.0°F | Ht 59.0 in

## 2023-05-22 DIAGNOSIS — C3412 Malignant neoplasm of upper lobe, left bronchus or lung: Secondary | ICD-10-CM | POA: Insufficient documentation

## 2023-05-22 DIAGNOSIS — C7931 Secondary malignant neoplasm of brain: Secondary | ICD-10-CM | POA: Insufficient documentation

## 2023-05-22 DIAGNOSIS — C3490 Malignant neoplasm of unspecified part of unspecified bronchus or lung: Secondary | ICD-10-CM

## 2023-05-22 DIAGNOSIS — C7951 Secondary malignant neoplasm of bone: Secondary | ICD-10-CM | POA: Diagnosis not present

## 2023-05-22 DIAGNOSIS — G893 Neoplasm related pain (acute) (chronic): Secondary | ICD-10-CM

## 2023-05-22 DIAGNOSIS — Z923 Personal history of irradiation: Secondary | ICD-10-CM | POA: Insufficient documentation

## 2023-05-22 DIAGNOSIS — Z515 Encounter for palliative care: Secondary | ICD-10-CM | POA: Diagnosis not present

## 2023-05-22 MED ORDER — OXYCODONE HCL 5 MG PO TABS: 5.0000 mg | ORAL_TABLET | ORAL | 0 refills | Status: DC | PRN

## 2023-05-22 MED ORDER — NALOXONE HCL 4 MG/0.1ML NA LIQD: NASAL | 0 refills | Status: DC

## 2023-05-22 MED ORDER — XTAMPZA ER 9 MG PO C12A: 1.0000 | EXTENDED_RELEASE_CAPSULE | Freq: Two times a day (BID) | ORAL | 0 refills | Status: DC

## 2023-05-22 NOTE — Telephone Encounter (Signed)
I personally reached out to Victoria Foley/Victoria Foley's daughter. Dr. Mervyn Skeeters is waiting to hear back from Dr. Chestine Spore- msg left to discuss Victoria Foley's plan of care. Victoria Foley already spoke with Josh today for pain mgmt.Josh prescribed/add long acting-Xtampza ER for Victoria Foley's pain mgmt.  Victoria Foley has apts w/Dr. Chestine Spore on Monday.  Apts with radiology on 2/3 for ct scan-chest abd and pelvis cnl as this was already performed.- daughter aware.   Victoria Foley prefers sooner apt with Dr. Mervyn Skeeters than 2/13 if possible to discuss plan of care.

## 2023-05-22 NOTE — Progress Notes (Signed)
Virtual Visit via Telephone Note  I connected with Victoria Foley on 05/22/23 at  1:50 PM EST by telephone and verified that I am speaking with the correct person using two identifiers.  Location: Patient: Home Provider: Clinic   I discussed the limitations, risks, security and privacy concerns of performing an evaluation and management service by telephone and the availability of in person appointments. I also discussed with the patient that there may be a patient responsible charge related to this service. The patient expressed understanding and agreed to proceed.   History of Present Illness: Victoria Foley is a 72 y.o. 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  Patient is status post RT to the left hip due to nondisplaced pathologic fracture.  She is status post palliative RT to brain.  She is on systemic chemotherapy.    Observations/Objective: I called and spoke with patient and daughter today by phone.  Patient had PET scan on 05/16/2023, which unfortunately showed progressive osseous metastatic disease.  Patient also had MRI left and right hip and left and right shoulder with skeletal metastatic disease in each of those locations.  Patient continues to endorse persistent and poorly controlled pain.  She says that she is taking oxycodone, which may help slightly but is short-lived.  She says she is tolerating oxycodone well and denies any adverse effects.  She did have some constipation but that resolved with a bowel regimen.  Patient is taking oxycodone at every 4 hours around-the-clock.  Discussed differences between short acting and long-acting opioids.  I would recommend starting her on a long-acting opioid to try to better control pain around-the-clock.  Patient can also liberalize her short acting as needed for breakthrough pain.  Note plan for XRT, which  hopefully will also help improve pain.  Patient denies other symptomatic complaints or concerns today.  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  Start Xtampza ER 9 mg every 12 hours.  Liberalize oxycodone 5 to 10 mg every 4 hours as needed for breakthrough pain.  Patient can also utilize acetaminophen 2-3 times daily as needed.  Avoid NSAIDs given CKD.  Daily bowel regimen to prevent opioid-induced constipation  Follow Up Instructions: Follow-up with Dr. Alena Bills   I discussed the assessment and treatment plan with the patient. The patient was provided an opportunity to ask questions and all were answered. The patient agreed with the plan and demonstrated an understanding of the instructions.   The patient was advised to call back or seek an in-person evaluation if the symptoms worsen or if the condition fails to improve as anticipated.  I provided 10 minutes of non-face-to-face time during this encounter.   Malachy Moan, NP

## 2023-05-22 NOTE — Progress Notes (Signed)
Radiation Oncology Follow up Note  Name: Victoria Foley   Date:   05/22/2023 MRN:  [id number] DOB: 20-May-1950    This 73 y.o. female presents to the clinic today for reevaluation of widespread metastatic disease for stage IV adenocarcinoma of the lung previously treated to her pelvis as well as left temporal occipital area of her brain and calvarium.  REFERRING PROVIDER: Dana Allan, MD  HPI: Patient is a 73 year old female well-known to department having received multiple courses of palliative radiation therapy for stage IV adenocarcinoma of the lung.  We previously treated her bilateral pelvis for metastatic disease as well as left frontal region of her calvarium and brain for metastatic involvement..  From a brain standpoint she is asymptomatic she is having increased her lower back pain and bilateral hip pain.  Recent PET scan shows multifocal hypermetabolic osseous metastatic disease progressing.  She also has residual spiculated left upper lobe nodule with intermediate metabolic activity consistent with residual primary bronchogenic carcinoma.  There is significant activity in L1-L5 with near complete replacement of the L1 vertebral body and posterior elements.  Recent MRI scans of her left shoulder shows bone metastasis within the distal clavicle and left fifth rib.  MRI scan of her right shoulder shows 1.8 cm marrow replacing bone lesion within the humeral head with no pathologic fracture.  MRI scans of her pelvis showed numerous marrow replacing metastatic bone lesions throughout the pelvis lower lumbar spine and proximal left femur.  In regards to her previously treated left temporal occipital junction lesion there is been slight progression from 11 mm to 13 mm which is slightly larger than September 2024.  According to reader there were the overall axial plane diameter is increased by only a few millimeters in the cephalocaudal thickness and diminished from 5 mm from May 24 study overseeing  today could be treatment effect.  There were no new or progressive parenchymal metastatic lesions.  Patient complains mostly of lower back pain and bilateral hip pain.  COMPLICATIONS OF TREATMENT: none  FOLLOW UP COMPLIANCE: keeps appointments   PHYSICAL EXAM:  BP 130/72   Pulse (!) 114   Temp (!) 97 F (36.1 C) (Tympanic)   Ht 4\' 11"  (1.499 m)   BMI 27.96 kg/m  Patient is wheelchair-bound.  Motor and sensory levels in her lower extremities are equal and symmetric proprioception is intact.  Crude visual fields are within normal range.  Well-developed well-nourished patient in NAD. HEENT reveals PERLA, EOMI, discs not visualized.  Oral cavity is clear. No oral mucosal lesions are identified. Neck is clear without evidence of cervical or supraclavicular adenopathy. Lungs are clear to A&P. Cardiac examination is essentially unremarkable with regular rate and rhythm without murmur rub or thrill. Abdomen is benign with no organomegaly or masses noted. Motor sensory and DTR levels are equal and symmetric in the upper and lower extremities. Cranial nerves II through XII are grossly intact. Proprioception is intact. No peripheral adenopathy or edema is identified. No motor or sensory levels are noted. Crude visual fields are within normal range.  RADIOLOGY RESULTS: MRI scans bone scan PET scan all reviewed compatible with above-stated findings  PLAN: Based on the lack of symptoms and questionable progression of the left temporal occipital junction region previously treated I am not in favor of retreating this area at this point time.  More concerned about her lumbar spine with posterior element involvement and close impingement on the cord.  I am planning palliative radiation therapy to her lumbar spine  30 Gray in 10 fractions.  Risks and benefits of treatment including possible diarrhea fatigue alteration blood counts skin reaction all were discussed in detail with the patient.  She is consented to  treatment.  I personally set up and ordered CT simulation for first thing next week.  Should see subsequent studies show any significant progression in the left temporal occipital region would offer retreatment at that time.  I would like to take this opportunity to thank you for allowing me to participate in the care of your patient.Carmina Miller, MD

## 2023-05-22 NOTE — Telephone Encounter (Signed)
I personally reached out to pt/pt's daughter. Dr. Mervyn Skeeters is waiting to hear back from Dr. Chestine Spore- msg left to discuss pt's plan of care. Pt already spoke with Josh today for pain mgmt.Josh prescribed/add long acting-Xtampza ER for pt's pain mgmt.  Pt has apts w/Dr. Chestine Spore on Monday.  Apts with radiology on 2/3 for ct scan-chest abd and pelvis cnl as this was already performed.- daughter aware.   Pt prefers sooner apt with Dr. Mervyn Skeeters than 2/13 if possible to discuss plan of care.

## 2023-05-26 ENCOUNTER — Encounter: Payer: Self-pay | Admitting: Internal Medicine

## 2023-05-26 ENCOUNTER — Ambulatory Visit: Payer: PPO | Admitting: Internal Medicine

## 2023-05-26 ENCOUNTER — Other Ambulatory Visit: Payer: Self-pay

## 2023-05-26 ENCOUNTER — Ambulatory Visit
Admission: RE | Admit: 2023-05-26 | Discharge: 2023-05-26 | Disposition: A | Discharge status: Home / Self Care | Payer: PPO | Source: Ambulatory Visit | Attending: Radiation Oncology | Admitting: Radiation Oncology

## 2023-05-26 ENCOUNTER — Inpatient Hospital Stay: Payer: PPO

## 2023-05-26 ENCOUNTER — Ambulatory Visit: Payer: PPO

## 2023-05-26 ENCOUNTER — Telehealth: Payer: Self-pay | Admitting: *Deleted

## 2023-05-26 ENCOUNTER — Inpatient Hospital Stay (HOSPITAL_BASED_OUTPATIENT_CLINIC_OR_DEPARTMENT_OTHER): Payer: PPO | Admitting: Internal Medicine

## 2023-05-26 VITALS — BP 138/91 | HR 92 | Temp 98.1°F | Resp 18 | Ht 59.0 in | Wt 134.0 lb

## 2023-05-26 DIAGNOSIS — M6250 Muscle wasting and atrophy, not elsewhere classified, unspecified site: Secondary | ICD-10-CM | POA: Insufficient documentation

## 2023-05-26 DIAGNOSIS — N189 Chronic kidney disease, unspecified: Secondary | ICD-10-CM | POA: Insufficient documentation

## 2023-05-26 DIAGNOSIS — I700 Atherosclerosis of aorta: Secondary | ICD-10-CM | POA: Insufficient documentation

## 2023-05-26 DIAGNOSIS — S46011A Strain of muscle(s) and tendon(s) of the rotator cuff of right shoulder, initial encounter: Secondary | ICD-10-CM | POA: Insufficient documentation

## 2023-05-26 DIAGNOSIS — Z923 Personal history of irradiation: Secondary | ICD-10-CM | POA: Insufficient documentation

## 2023-05-26 DIAGNOSIS — S46012A Strain of muscle(s) and tendon(s) of the rotator cuff of left shoulder, initial encounter: Secondary | ICD-10-CM | POA: Insufficient documentation

## 2023-05-26 DIAGNOSIS — C3490 Malignant neoplasm of unspecified part of unspecified bronchus or lung: Secondary | ICD-10-CM

## 2023-05-26 DIAGNOSIS — R140 Abdominal distension (gaseous): Secondary | ICD-10-CM | POA: Insufficient documentation

## 2023-05-26 DIAGNOSIS — M84454A Pathological fracture, pelvis, initial encounter for fracture: Secondary | ICD-10-CM | POA: Insufficient documentation

## 2023-05-26 DIAGNOSIS — K8020 Calculus of gallbladder without cholecystitis without obstruction: Secondary | ICD-10-CM | POA: Insufficient documentation

## 2023-05-26 DIAGNOSIS — M533 Sacrococcygeal disorders, not elsewhere classified: Secondary | ICD-10-CM | POA: Insufficient documentation

## 2023-05-26 DIAGNOSIS — I129 Hypertensive chronic kidney disease with stage 1 through stage 4 chronic kidney disease, or unspecified chronic kidney disease: Secondary | ICD-10-CM | POA: Insufficient documentation

## 2023-05-26 DIAGNOSIS — G620 Drug-induced polyneuropathy: Secondary | ICD-10-CM | POA: Insufficient documentation

## 2023-05-26 DIAGNOSIS — R21 Rash and other nonspecific skin eruption: Secondary | ICD-10-CM | POA: Insufficient documentation

## 2023-05-26 DIAGNOSIS — R12 Heartburn: Secondary | ICD-10-CM | POA: Insufficient documentation

## 2023-05-26 DIAGNOSIS — C7951 Secondary malignant neoplasm of bone: Secondary | ICD-10-CM | POA: Insufficient documentation

## 2023-05-26 DIAGNOSIS — K5900 Constipation, unspecified: Secondary | ICD-10-CM | POA: Insufficient documentation

## 2023-05-26 DIAGNOSIS — C3412 Malignant neoplasm of upper lobe, left bronchus or lung: Secondary | ICD-10-CM | POA: Insufficient documentation

## 2023-05-26 DIAGNOSIS — C3492 Malignant neoplasm of unspecified part of left bronchus or lung: Secondary | ICD-10-CM | POA: Diagnosis not present

## 2023-05-26 DIAGNOSIS — M2540 Effusion, unspecified joint: Secondary | ICD-10-CM | POA: Insufficient documentation

## 2023-05-26 DIAGNOSIS — T451X5A Adverse effect of antineoplastic and immunosuppressive drugs, initial encounter: Secondary | ICD-10-CM | POA: Insufficient documentation

## 2023-05-26 DIAGNOSIS — Z8616 Personal history of COVID-19: Secondary | ICD-10-CM | POA: Insufficient documentation

## 2023-05-26 DIAGNOSIS — G893 Neoplasm related pain (acute) (chronic): Secondary | ICD-10-CM | POA: Insufficient documentation

## 2023-05-26 DIAGNOSIS — C7931 Secondary malignant neoplasm of brain: Secondary | ICD-10-CM | POA: Insufficient documentation

## 2023-05-26 DIAGNOSIS — Z79899 Other long term (current) drug therapy: Secondary | ICD-10-CM | POA: Insufficient documentation

## 2023-05-26 DIAGNOSIS — Z5111 Encounter for antineoplastic chemotherapy: Secondary | ICD-10-CM | POA: Insufficient documentation

## 2023-05-26 DIAGNOSIS — R630 Anorexia: Secondary | ICD-10-CM | POA: Insufficient documentation

## 2023-05-26 DIAGNOSIS — D649 Anemia, unspecified: Secondary | ICD-10-CM

## 2023-05-26 DIAGNOSIS — Z510 Encounter for antineoplastic radiation therapy: Secondary | ICD-10-CM | POA: Insufficient documentation

## 2023-05-26 DIAGNOSIS — Z7952 Long term (current) use of systemic steroids: Secondary | ICD-10-CM | POA: Insufficient documentation

## 2023-05-26 LAB — BASIC METABOLIC PANEL - CANCER CENTER ONLY
Anion gap: 5 (ref 5–15)
BUN: 24 mg/dL — ABNORMAL HIGH (ref 8–23)
CO2: 22 mmol/L (ref 22–32)
Calcium: 9.6 mg/dL (ref 8.9–10.3)
Chloride: 107 mmol/L (ref 98–111)
Creatinine: 1.24 mg/dL — ABNORMAL HIGH (ref 0.44–1.00)
GFR, Estimated: 47 mL/min — ABNORMAL LOW (ref >60)
Glucose, Bld: 100 mg/dL — ABNORMAL HIGH (ref 70–99)
Potassium: 4.7 mmol/L (ref 3.5–5.1)
Sodium: 134 mmol/L — ABNORMAL LOW (ref 135–145)

## 2023-05-26 LAB — CBC WITH DIFFERENTIAL/PLATELET
Abs Immature Granulocytes: 0.02 10*3/uL (ref 0.00–0.07)
Basophils Absolute: 0 10*3/uL (ref 0.0–0.1)
Basophils Relative: 0 %
Eosinophils Absolute: 0 10*3/uL (ref 0.0–0.5)
Eosinophils Relative: 1 %
HCT: 32.8 % — ABNORMAL LOW (ref 36.0–46.0)
Hemoglobin: 10.8 g/dL — ABNORMAL LOW (ref 12.0–15.0)
Immature Granulocytes: 0 %
Lymphocytes Relative: 16 %
Lymphs Abs: 0.8 10*3/uL (ref 0.7–4.0)
MCH: 33.2 pg (ref 26.0–34.0)
MCHC: 32.9 g/dL (ref 30.0–36.0)
MCV: 100.9 fL — ABNORMAL HIGH (ref 80.0–100.0)
Monocytes Absolute: 0.2 10*3/uL (ref 0.1–1.0)
Monocytes Relative: 5 %
Neutro Abs: 3.6 10*3/uL (ref 1.7–7.7)
Neutrophils Relative %: 78 %
Platelets: 148 10*3/uL — ABNORMAL LOW (ref 150–400)
RBC: 3.25 MIL/uL — ABNORMAL LOW (ref 3.87–5.11)
RDW: 12 % (ref 11.5–15.5)
WBC: 4.7 10*3/uL (ref 4.0–10.5)
nRBC: 0 % (ref 0.0–0.2)

## 2023-05-26 LAB — IRON AND TIBC
Iron: 68 ug/dL (ref 28–170)
Saturation Ratios: 25 % (ref 10.4–31.8)
TIBC: 272 ug/dL (ref 250–450)
UIBC: 204 ug/dL

## 2023-05-26 LAB — FERRITIN: Ferritin: 1083 ng/mL — ABNORMAL HIGH (ref 11–307)

## 2023-05-26 MED ORDER — DENOSUMAB 120 MG/1.7ML ~~LOC~~ SOLN
120.0000 mg | Freq: Once | SUBCUTANEOUS | Status: AC
  Administered 2023-05-26: 120 mg via SUBCUTANEOUS
  Filled 2023-05-26: qty 1.7

## 2023-05-26 NOTE — Telephone Encounter (Signed)
Spoke with patient's daughter to follow-up on pt's care. Pt had virtual visit today with Dr. Kemper Foley at [facility]. (Provider note not yet available in epic). Per Victoria Foley, [facility] recommended d/c the Avastin and starting with Gemzar s/p completion of radiation of L1. Dr. Kemper Foley recommended to proceed with xgeva as planned.  Pt and family would like to discuss plan of care directly with Dr. Mervyn Foley sooner than the 2/13 apt with Dr. Mervyn Foley. Family would like to know if the "area in the neck needs to be radiated as well." They would like to discuss this with Dr. Rushie Foley and Dr. Mervyn Foley.  I spoke with Dr. Mervyn Foley and she agreed to see patient today after radiation for lab/ md/ xgeva (metb ordered). Pt daughter Victoria Foley will be accompanying pt to apt today. Victoria Foley will communicate message to [place]. Family gave understanding that it took some time for the various providers to collaborate within the multidisciplinary approach. I reassured family that our providers have communicated. We were just waiting on the apt recommendations today from Dr. Kemper Foley. Victoria Foley was very appreciative for phone call and collaboration of care. RN provided Active listening and emotional support in phone call to patient's daughter. Victoria Foley is aware that Dr. Mervyn Foley could conference call her at this apt as only one person can be with patient at the appointment.

## 2023-05-27 ENCOUNTER — Encounter: Payer: Self-pay | Admitting: *Deleted

## 2023-05-27 ENCOUNTER — Encounter: Payer: Self-pay | Admitting: Internal Medicine

## 2023-05-27 ENCOUNTER — Telehealth: Payer: Self-pay | Admitting: *Deleted

## 2023-05-27 DIAGNOSIS — C7951 Secondary malignant neoplasm of bone: Secondary | ICD-10-CM | POA: Insufficient documentation

## 2023-05-27 NOTE — Progress Notes (Signed)
[facility] OFFICE PROGRESS NOTE  Patient Care Team: Dana Allan, MD as PCP - General (Family Medicine) Glory Buff, RN as Oncology Nurse Navigator Michaelyn Barter, MD as Consulting Physician (Oncology) Sondra Come, MD as Consulting Physician (Urology)  TREATMENT:  Right hip palliative RT 30 cGy completed 12/18/2021 Carboplatin, alimta and Keytruda x 4 cycles completed 02/19/22.  Discontinued maintenance alimta (04-23-22) due to worsening renal dysfunction, transaminitis and cytopenias Keytruda maintenance-discontinued on 09/17/2022 due to disease progression Palliative RT to left frontal lesion 5 fx completed 09/27/22 Palliative RT to left hip completed September 2024 Carboplatin (AUC 4) and paclitaxel 150 mg/m2 - x 5 cycles ( 10/07/2022- 01/21/2023). Maintenance Avastin 15 mg/kg- 03/27/2023- 04/24/23  ASSESSMENT & PLAN:  # Primary lung adenocarcinoma (HCC), Stage IV, PDL1 1% -Progressed through first-line [place] Alimta, Keytruda and then [place] maintenance on 09/17/2022.  -s/p second line carbo AUC 4 and Taxol 150 mg/m on 10/07/2022.  Completed 5 cycles and then discontinued due to severe cytopenias.  Patient agreed for maintenance Avastin (was not added with chemo because patient was hesitant).  Progressed through maintenance Avastin.  Last dose 04/24/2023.  -Foundation 1 from August 2023 no targets.  Liquid foundation 1 CDX from June 2024 with no targetable mutation.  Repeat lung biopsy on 12/17/2022 sent for foundation 1 CDX testing was limited due to inadequate sample but no targets seen.  RNA fusion panel was sent on primary biopsy from August 2023.  It could not be run due to low tumor purity.  -Due to increasing joint pains, PET CT scan was done.  The results were available on Wednesday and was discussed in the tumor conference on Thursday. Concern for progression of bone disease.  My nurse, Herbert Seta had reached out to the patient to respond to questions received through  MyChart.  Patient requested all this information to be available to Dr. Kemper Durie at [facility].  I placed a call to him right away.  She had a telemedicine visit with Dr. Kemper Durie on Monday and was recommended gemcitabine single agent after completion of radiation to lumbar spine.  Treatment plan placed for gemcitabine day 1 day 8 every 21-day cycle.  Will consider starting at 800 mg/m dosing since this is her third line and concerns for cytopenias.  We have also sent a request to add on HER2 IHC and c-Met to pathology.  -Continue with care coordination with Dr. Kemper Durie at [facility].    # Constipation -Continue with Senokot, MiraLAX as needed.  # Cancer-related pain -Follows with palliative care. 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   -Follow-up with Dr. Barbaraann Cao.  Dr. Barbaraann Cao discussed the case with Dr. Aggie Cosier who plans to hold off on radiation at this time.  Since the growth is minimal and patient is asymptomatic. -Follow-up MRI brain scheduled in April 2025.  # Chemotherapy induced peripheral neuropathy -Stable.  Started on gabapentin 100 mg 3 times daily, taper as tolerated.  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 Cr stabilized.  -will continue to monitor.  # Chemotherapy-induced anemia  -Aranesp 300 mcg as needed.   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 Aggie Cosier on 12/18/2021 -Completed palliative RT to left hip on 01/01/2023.   -Was seen by [facility].  No surgical intervention needed. -Plan for  palliative RT to lumbar spine for pain control  # Metastatic bone lesions -Xgeva 120 mg subcu monthly for now.  # Access - port  RTC in 3 weeks for MD visit, labs, cycle 1 gemcitabine  Orders Placed This Encounter  Procedures   Consent Attestation for  Oncology Treatment    The patient is informed of risks, benefits, side-effects of the prescribed oncology treatment. Potential short term and long term side effects and response rates discussed. After a long discussion, the patient made informed decision to proceed.:   Yes   For home use only DME standard manual wheelchair with seat cushion    Patient suffers from metastatic lung cancer which impairs their ability to perform daily activities like bathing, dressing, grooming, feeding, and toileting in the home.  A walker will not resolve issue with performing activities of daily living. A wheelchair will allow patient to safely perform daily activities. Patient can safely propel the wheelchair in the home or has a caregiver who can provide assistance. Length of need Lifetime. Accessories: elevating leg rests (ELRs), wheel locks, extensions and anti-tippers.   CBC with Differential (Cancer Center Only)    Standing Status:   Future    Expected Date:   06/16/2023    Expiration Date:   06/15/2024   CMP (Cancer Center only)    Standing Status:   Future    Expected Date:   06/16/2023    Expiration Date:   06/15/2024   CBC with Differential (Cancer Center Only)    Standing Status:   Future    Expected Date:   06/23/2023    Expiration Date:   06/22/2024   CMP (Cancer Center only)    Standing Status:   Future    Expected Date:   06/23/2023    Expiration Date:   06/22/2024    [facility]- 12/06/2021   All questions were answered. The patient knows to call the clinic with any problems, questions or concerns. The total time spent in the appointment was 30 minutes encounter with patients including review of chart and various tests results, discussions about plan of care and coordination of care plan   Michaelyn Barter, MD 05/27/2023 12:42 PM  INTERVAL HISTORY: Patient seen today as follow-up accompanied with daughter for management of stage IV lung adenocarcinoma. Continues to be in pain.  She is taking  Xtampza twice a day.  But has minimized taking oxycodone IR release.  Wheelchair today.  Mobility is affected by pain.  Reiterated the importance of taking pain meds. Denies any constipation at this time.  Not regular with bowel regimen. Had telemetry medicine visit this morning with Dr. Kemper Durie and was recommended switching to gemcitabine.  REVIEW OF SYSTEMS:   Positive ROS as above.  Rest 10 points ROS negative   I have reviewed the past medical history, past surgical history, social history and family history with the patient and they are unchanged from previous note.  ALLERGIES:  is allergic to diclofenac, lisinopril, and sulfa antibiotics.  MEDICATIONS:  Current Outpatient Medications  Medication Sig Dispense Refill   albuterol (VENTOLIN HFA) 108 (90 Base) MCG/ACT inhaler Inhale 2 puffs into the lungs every 6 (six) hours as needed for wheezing or shortness of breath. 18 g 0   Azelastine HCl 137 MCG/SPRAY SOLN Place 2 sprays into both nostrils 2 (two) times daily.     Cholecalciferol (VITAMIN D-3 PO) Take by mouth.     fluticasone (FLONASE) 50 MCG/ACT nasal spray USE TWO SPRAYS IN EACH NOSTRIL  ONCE DAILY prn 16 g 12   loratadine (CLARITIN) 10 MG tablet TAKE ONE TABLET BY MOUTH DAILY AS NEEDED FOR ALLERGIES 90 tablet 3   losartan (COZAAR) 100 MG tablet Take 1 tablet (100 mg total) by mouth daily. 90 tablet 3   magic mouthwash w/lidocaine SOLN Take 5 mLs by mouth 4 (four) times daily as needed for mouth pain. Sig: Swish/Swallow 5-10 ml four times a day as needed. Dispense 480 ml. 1RF 480 mL 1   MULTIPLE VITAMIN PO Take 1 tablet by mouth daily.     ondansetron (ZOFRAN) 8 MG tablet Take 1 tablet (8 mg total) by mouth every 8 (eight) hours as needed for nausea or vomiting. 30 tablet 3   oxyCODONE (OXY IR/ROXICODONE) 5 MG immediate release tablet Take 1-2 tablets (5-10 mg total) by mouth every 4 (four) hours as needed for severe pain (pain score 7-10). 60 tablet 0   oxyCODONE ER (XTAMPZA ER) 9  MG C12A Take 1 capsule by mouth every 12 (twelve) hours. 60 capsule 0   senna (SENOKOT) 8.6 MG TABS tablet Take 1 tablet by mouth daily.     dexamethasone (DECADRON) 4 MG tablet Take 1 tablet (4 mg total) by mouth daily. Take for 3 days after chemotherapy (Patient not taking: Reported on 05/26/2023) 30 tablet 0   gabapentin (NEURONTIN) 100 MG capsule Take 1 capsule (100 mg total) by mouth 3 (three) times daily. 90 capsule 0   lactulose (CHRONULAC) 10 GM/15ML solution Take 15 mLs (10 g total) by mouth 3 (three) times daily. (Patient not taking: Reported on 05/26/2023) 236 mL 0   naloxone (NARCAN) nasal spray 4 mg/0.1 mL SPRAY 1 SPRAY INTO ONE NOSTRIL AS DIRECTED FOR OPIOID OVERDOSE (TURN PERSON ON SIDE AFTER DOSE. IF NO RESPONSE IN 2-3 MINUTES OR PERSON RESPONDS BUT RELAPSES, REPEAT USING A NEW SPRAY DEVICE AND SPRAY INTO THE OTHER NOSTRIL. CALL 911 AFTER USE.) * EMERGENCY USE ONLY * (Patient not taking: Reported on 05/26/2023) 1 each 0   valACYclovir (VALTREX) 500 MG tablet Take 1 tablet (500 mg total) by mouth 2 (two) times daily. For shingles prophylaxis (Patient not taking: Reported on 05/26/2023) 60 tablet 1   No current facility-administered medications for this visit.   Facility-Administered Medications Ordered in Other Visits  Medication Dose Route Frequency Provider Last Rate Last Admin   heparin lock flush 100 UNIT/ML injection            heparin lock flush 100 unit/mL  500 Units Intravenous Once Michaelyn Barter, MD       sodium chloride flush (NS) 0.9 % injection 10 mL  10 mL Intravenous Once Michaelyn Barter, MD        SUMMARY OF ONCOLOGIC HISTORY: Oncology History  Primary lung adenocarcinoma (HCC)  10/10/2021 Initial Diagnosis   Primary lung adenocarcinoma (HCC) Stage IV  Patient seen by PCP for wheezing for 2 weeks. Imaging with CXR followed by CT chest showed Spiculated 2.1 x 2.2 cm left upper lobe pulmonary nodule with associated pleural tethering. Innumerable subcentimeter pulmonary  nodules throughout the lungs.      11/14/2021 PET scan   IMPRESSION: 1. Left suprahilar nodule with maximum SUV 4.5, and a more distal 2.2 cm left upper lobe nodule with maximum SUV of 5.5. 2. Innumerable scattered small pulmonary nodules, most of which are under 5 mm in diameter, throughout both lungs. Some are cavitary. Possibilities include cavitating hematogenous disseminated malignancy versus infectious etiology subset as septic emboli. Most of the nodules are stable, some  are minimally enlarged compared to 10/10/2021. 3. Abnormal mild permeative type bony findings in the right hemipelvis associated with substantially accentuated metabolic activity. Possible associated pathologic fracture through the right quadrilateral plate and right acetabulum. The findings involve the right ilium and ischium but also the right lateral sacral ala and a small focus in the left sacrum.    Pathology Results   She had transbronchial biopsies by Dr. Jayme Cloud on 11/19/21.  Pathology from LUL nodule showed adenocarcinoma, consistent with lung primary.    11/19/2021 Cancer Staging   Staging form: Lung, AJCC 8th Edition - Clinical stage from 11/19/2021: Stage IVB (cT1c, cM1c) - Signed by Michaelyn Barter, MD on 11/27/2021 Stage prefix: Initial diagnosis   11/27/2021 Imaging   MRI pelvis  IMPRESSION: 1. Abnormal marrow signal throughout the right ilium involving the right acetabulum, right superior pubic ramus and right inferior pubic ramus, right side of the sacrum and a smaller focus of abnormal marrow lesion in the left side of the sacrum. Findings are concerning for metastatic disease. 2. Nondisplaced pathologic fracture of the quadrilateral plate of the right acetabulum. 3. Mild muscle edema in the right gluteus minimus and medius muscles likely reflecting mild muscle strain.  MRI brain  IMPRESSION: No evidence of intracranial metastatic disease.   Indeterminate 1 cm left frontal calvarium  lesion.   12/18/2021 - 08/27/2022 Chemotherapy   Patient is on Treatment Plan : LUNG Carboplatin (5) + Pemetrexed (500) + Pembrolizumab (200) D1 q21d Induction x 4 cycles / Maintenance Pemetrexed (500) + Pembrolizumab (200) D1 q21d     10/07/2022 - 01/21/2023 Chemotherapy   Patient is on Treatment Plan : LUNG Carboplatin + Paclitaxel q21d Dose Reduction     03/27/2023 - 04/24/2023 Chemotherapy   Patient is on Treatment Plan : LUNG Bevacizumab q21d     06/16/2023 -  Chemotherapy   Patient is on Treatment Plan : LUNG Gemcitabine (1000) D1,8 q21d     Primary malignant neoplasm of left upper lobe of lung (HCC)  06/04/2022 Initial Diagnosis   Primary malignant neoplasm of left upper lobe of lung (HCC)   06/04/2022 Cancer Staging   Staging form: Lung, AJCC 8th Edition - Pathologic: Stage IVB (pT1c, pNX, cM1c) - Signed by Earna Coder, MD on 06/04/2022     PHYSICAL EXAMINATION: ECOG PERFORMANCE STATUS: 2 - Symptomatic, <50% confined to bed  Vitals:   05/26/23 1430  BP: (!) 138/91  Pulse: 92  Resp: 18  Temp: 98.1 F (36.7 C)    Filed Weights   05/26/23 1423 05/26/23 1430  Weight: 134 lb (60.8 kg) 134 lb (60.8 kg)     Physical Exam Constitutional:      Appearance: Normal appearance.  HENT:     Head: Normocephalic and atraumatic.  Cardiovascular:     Rate and Rhythm: Normal rate.  Pulmonary:     Effort: Pulmonary effort is normal.  Musculoskeletal:     Right lower leg: No edema.     Left lower leg: No edema.  Skin:    General: Skin is warm.  Neurological:     General: No focal deficit present.     Mental Status: She is oriented to person, place, and time.  Psychiatric:        Mood and Affect: Mood normal.     LABORATORY DATA:  I have reviewed the data as listed    Component Value Date/Time   NA 134 (L) 05/26/2023 1400   K 4.7 05/26/2023 1400   CL 107 05/26/2023 1400  CO2 22 05/26/2023 1400   GLUCOSE 100 (H) 05/26/2023 1400   BUN 24 (H) 05/26/2023 1400    CREATININE 1.24 (H) 05/26/2023 1400   CALCIUM 9.6 05/26/2023 1400   PROT 7.2 05/15/2023 1459   ALBUMIN 3.6 05/15/2023 1459   AST 22 05/15/2023 1459   ALT 13 05/15/2023 1459   ALKPHOS 170 (H) 05/15/2023 1459   BILITOT 1.0 05/15/2023 1459   GFRNONAA 47 (L) 05/26/2023 1400    No results found for: "SPEP", "UPEP"  Lab Results  Component Value Date   WBC 4.7 05/26/2023   NEUTROABS 3.6 05/26/2023   HGB 10.8 (L) 05/26/2023   HCT 32.8 (L) 05/26/2023   MCV 100.9 (H) 05/26/2023   PLT 148 (L) 05/26/2023      Chemistry      Component Value Date/Time   NA 134 (L) 05/26/2023 1400   K 4.7 05/26/2023 1400   CL 107 05/26/2023 1400   CO2 22 05/26/2023 1400   BUN 24 (H) 05/26/2023 1400   CREATININE 1.24 (H) 05/26/2023 1400      Component Value Date/Time   CALCIUM 9.6 05/26/2023 1400   ALKPHOS 170 (H) 05/15/2023 1459   AST 22 05/15/2023 1459   ALT 13 05/15/2023 1459   BILITOT 1.0 05/15/2023 1459       RADIOGRAPHIC STUDIES: I have personally reviewed the radiological images as listed and agreed with the findings in the report. MR SHOULDER LEFT W WO CONTRAST Result Date: 05/21/2023 CLINICAL DATA:  Metastatic non-small cell lung cancer EXAM: MRI OF THE LEFT SHOULDER WITHOUT AND WITH CONTRAST TECHNIQUE: Multiplanar, multisequence MR imaging of the left shoulder was performed before and after the administration of intravenous contrast. CONTRAST:  6mL GADAVIST GADOBUTROL 1 MMOL/ML IV SOLN COMPARISON:  PET-CT 05/16/2023 FINDINGS: Rotator cuff: Moderate-severe supraspinatus tendinosis with full-thickness, near-complete tear of the distal tendon with approximately 1.0 cm of tendon retraction. Moderate infraspinatus and mild subscapularis tendinosis without tear. Intact teres minor. Muscles: Preserved bulk and signal intensity of the rotator cuff musculature without edema, atrophy, or fatty infiltration. Biceps long head:  Mild intra-articular biceps tendinosis. Acromioclavicular Joint:  Mild-to-moderate degenerative changes of the AC joint. Moderate-severe subacromial-subdeltoid bursitis. Glenohumeral Joint: No joint effusion. No cartilage defect. Labrum:  No evidence of labral tear. No paralabral cyst. Bones: No acute fracture. No dislocation. Subcentimeter marrow replacing bone lesion within the distal clavicle. Additional bone lesions within the posterolateral left fifth rib and anterior left third rib. Left third rib pathologic fracture, better seen by recent PET-CT. Other: None. IMPRESSION: LEFT SHOULDER: 1. Bone metastases within the distal clavicle, left third rib, and left fifth rib. Left third rib pathologic fracture, better seen by recent PET-CT. 2. Moderate-severe supraspinatus tendinosis with full-thickness, near-complete tear of the distal tendon. 3. Moderate infraspinatus and mild subscapularis tendinosis without tear. 4. Mild intra-articular biceps tendinosis. 5. Moderate-severe subacromial-subdeltoid bursitis. Electronically Signed   By: Duanne Guess D.O.   On: 05/21/2023 08:30   MR SHOULDER RIGHT W WO CONTRAST Result Date: 05/21/2023 CLINICAL DATA:  Metastatic non-small cell lung cancer EXAM: MRI OF THE RIGHT SHOULDER WITHOUT AND WITH CONTRAST TECHNIQUE: Multiplanar, multisequence MR imaging of the right shoulder was performed before and after the administration of intravenous contrast. CONTRAST:  6mL GADAVIST GADOBUTROL 1 MMOL/ML IV SOLN COMPARISON:  PET-CT 05/15/2018 FINDINGS: Rotator cuff: Severe rotator cuff tendinosis. Full-thickness, near-complete tears of the supraspinatus and infraspinatus tendons with retraction to the level of the humeral head apex. A portion of each tendon does remain intact. Mild subscapularis tendinosis  without tear. Intact teres minor. Muscles:  Mild supraspinatus muscle atrophy. Biceps long head: Severe tendinosis and high-grade partial tearing of the intra-articular biceps tendon. Acromioclavicular Joint: Moderate degenerative changes of the  AC joint. Mild-moderate subacromial-subdeltoid bursitis. Glenohumeral Joint: Trace glenohumeral joint effusion. Small intra-articular loose body in the axillary pouch. Mild chondral thinning. Labrum:  Superior labral degeneration. Bones: 1.8 cm marrow replacing bone lesion within the anteromedial aspect of the humeral head with patchy postcontrast enhancement. No pathologic fracture. No additional marrow replacing bone lesions. No fracture or dislocation. Other: No lymphadenopathy within the included right axilla. IMPRESSION: RIGHT SHOULDER: 1. A 1.8 cm marrow replacing bone lesion within the humeral head, compatible with metastatic disease. No pathologic fracture. 2. Severe rotator cuff tendinosis with full-thickness, near-complete tears of the supraspinatus and infraspinatus tendons. Mild supraspinatus muscle atrophy. 3. Severe tendinosis and high-grade partial tearing of the intra-articular biceps tendon. 4. Moderate AC joint osteoarthritis. Mild-moderate subacromial-subdeltoid bursitis. 5. Mild glenohumeral osteoarthritis. Electronically Signed   By: Duanne Guess D.O.   On: 05/21/2023 08:21   MR HIP RIGHT W WO CONTRAST Result Date: 05/21/2023 CLINICAL DATA:  Metastatic non-small cell lung cancer EXAM: MRI OF THE RIGHT HIP WITHOUT AND WITH CONTRAST MRI OF THE LEFT HIP WITHOUT AND WITH CONTRAST TECHNIQUE: Multiplanar, multisequence MR imaging was performed both before and after administration of intravenous contrast. CONTRAST:  6mL GADAVIST GADOBUTROL 1 MMOL/ML IV SOLN COMPARISON:  X-ray 05/09/2023, PET-CT 05/16/2023, MRI 11/20/2022 FINDINGS: Bones/Joint/Cartilage Numerous marrow-replacing metastatic bone lesions throughout the pelvis, lower lumbar spine, and proximal left femur. Several of the lesions demonstrate postcontrast enhancement. Overall, there has been slight progression compared to the 11/20/2022 MRI. Postsurgical changes from ORIF the left femur. Chronic pathologic fracture of the right  acetabulum. No evidence of an acute pathologic fracture. No pelvic diastasis. Trace bilateral SI joint effusions. Moderate osteoarthritic changes of both hips. No hip joint effusion. No femoral head avascular necrosis. Ligaments Intact. Muscles and Tendons No acute musculotendinous abnormality. Soft tissues No soft tissue edema or fluid collection. No pelvic or inguinal lymphadenopathy. No extraosseous mass lesion. IMPRESSION: 1. 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 Overall, there has been slight progression compared to the 11/20/2022 MRI. 2. 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 No evidence of an acute pathologic fracture. Electronically Signed   By: Duanne Guess D.O.   On: 05/21/2023 08:15   MR HIP LEFT W WO CONTRAST Result Date: 05/21/2023 CLINICAL DATA:  Metastatic non-small cell lung cancer EXAM: MRI OF THE RIGHT HIP WITHOUT AND WITH CONTRAST MRI OF THE LEFT HIP WITHOUT AND WITH CONTRAST TECHNIQUE: Multiplanar, multisequence MR imaging was performed both before and after administration of intravenous contrast. CONTRAST:  6mL GADAVIST GADOBUTROL 1 MMOL/ML IV SOLN COMPARISON:  X-ray 05/09/2023, PET-CT 05/16/2023, MRI 11/20/2022 FINDINGS: Bones/Joint/Cartilage Numerous marrow-replacing metastatic bone lesions throughout the pelvis, lower lumbar spine, and proximal left femur. Several of the lesions demonstrate postcontrast enhancement. Overall, there has been slight progression compared to the 11/20/2022 MRI. Postsurgical changes from ORIF the left femur. 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 No evidence of an acute pathologic fracture. No pelvic diastasis. Trace bilateral SI joint effusions. Moderate osteoarthritic changes of both hips. No hip joint effusion. No femoral head avascular necrosis. Ligaments Intact. Muscles and Tendons No acute musculotendinous abnormality. Soft tissues No soft tissue  edema or fluid collection. No pelvic or inguinal lymphadenopathy. No extraosseous mass lesion. IMPRESSION: 1. Numerous marrow-replacing metastatic bone lesions throughout the pelvis,  lower lumbar spine, and proximal left femur. Overall, there has been slight progression compared to the 11/20/2022 MRI. 2. 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 No evidence of an acute pathologic fracture. Electronically Signed   By: Duanne Guess D.O.   On: 05/21/2023 08:15   NM PET Image Restag (PS) Skull Base To Thigh Result Date: 05/16/2023 CLINICAL DATA:  Subsequent treatment strategy for non-small cell lung cancer. EXAM: NUCLEAR MEDICINE PET SKULL BASE TO THIGH TECHNIQUE: 7.58 mCi F-18 FDG was injected intravenously. Full-ring PET imaging was performed from the skull base to thigh after the radiotracer. CT data was obtained and used for attenuation correction and anatomic localization. Fasting blood glucose: 52 mg/dl COMPARISON:  Whole-body bone scan 04/10/2023, CT of the chest, abdomen and pelvis 02/24/2023 and PET-CT 11/14/2021 FINDINGS: Mediastinal blood pool activity: SUV max NECK: There is extensive brown fat activity throughout the neck and shoulder regions, limiting sensitivity. Allowing for this, no definite hypermetabolic cervical lymph nodes are identified.Fairly symmetric activity within the lymphoid tissue of Waldeyer's ring is within physiologic limits. No suspicious activity identified within the pharyngeal mucosal space. Incidental CT findings: none CHEST: As in the neck, there is prominent brown fat activity in the paraspinal regions, limiting sensitivity. Allowing for this, no hypermetabolic mediastinal, hilar or axillary lymph nodes are identified. Residual spiculated left upper lobe nodule measures 2.0 x 1.4 cm on image 34/6, unchanged from recent chest CT. This demonstrates intermediate metabolic activity (SUV max 5.9), previously 5.5. Diffuse septal thickening and perilymphatic nodularity  throughout both lungs are grossly stable, without significant hypermetabolic activity. Incidental CT findings: Right IJ Port-A-Cath extends into the lower right atrium. ABDOMEN/PELVIS: There is no hypermetabolic activity within the liver, adrenal glands, spleen or pancreas. There is no hypermetabolic nodal activity in the abdomen or pelvis. Incidental CT findings: Cholelithiasis. Minimal aortic atherosclerosis. Prominent stool throughout the colon. SKELETON: There is multifocal hypermetabolic osseous metastatic disease which has progressed from the previous PET-CT. There is intense hypermetabolic activity within a C7 sclerotic metastasis (SUV max 11.5). Multiple hypermetabolic lesions are present within the lumbar spine, with near complete replacement the L1 vertebral body and its posterior elements (SUV max 11.8). Significant involvement also demonstrated within the L3, L4 and L5 vertebral bodies. There is also significant involvement within the left 3rd rib (SUV max 10.2), both sacral ala and both iliac bones. Two focal areas of hypermetabolic activity also present within proximal left femur status post ORIF, suspicious for residual metastatic disease. Incidental CT findings: none IMPRESSION: 1. Multifocal hypermetabolic osseous metastatic disease, progressed from previous PET-CT. 2. Residual spiculated left upper lobe nodule with intermediate metabolic activity, consistent with residual primary bronchogenic carcinoma. 3. 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 4. No evidence of hypermetabolic nodal or visceral metastatic disease. 5. Extensive brown fat activity throughout the neck and shoulder regions, limiting sensitivity. 6.  Aortic Atherosclerosis (ICD10-I70.0). Electronically Signed   By: Carey Bullocks M.D.   On: 05/16/2023 12:31   DG HIPS BILAT WITH PELVIS MIN 5 VIEWS Result Date: 05/09/2023 CLINICAL DATA:   Bilateral hip pain.  History of osseous metastases. EXAM: DG HIP (WITH OR WITHOUT PELVIS) 5+V BILAT COMPARISON:  February 24, 2023. FINDINGS: There is no evidence of hip fracture or dislocation. Moderate degenerative changes are seen involving both hip joints. Status post intramedullary rod fixation of left femur. Mixed lucent and sclerotic lesions seen in left femoral head consistent with metastatic lesion. Sclerosis is seen involving right ischial tuberosity  suggesting metastatic disease. 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 No acute fracture or dislocation. Osseous metastases are again noted. Electronically Signed   By: Lupita Raider M.D.   On: 05/09/2023 13:24   MR BRAIN W WO CONTRAST Result Date: 05/09/2023 CLINICAL DATA:  Brain/CNS neoplasm.  Assess treatment response. EXAM: MRI HEAD WITHOUT AND WITH CONTRAST TECHNIQUE: Multiplanar, multiecho pulse sequences of the brain and surrounding structures were obtained without and with intravenous contrast. CONTRAST:  6mL GADAVIST GADOBUTROL 1 MMOL/ML IV SOLN COMPARISON:  01/03/2023 and multiple previous FINDINGS: Brain: Diffusion imaging does not show any acute or subacute infarction or other cause of restricted diffusion. No abnormality is seen affecting the brainstem or cerebellum. Treated metastatic lesion at the inferior left temporal occipital junction today measures 13 mm front to back, 11 mm right to left, with a thickness of 3.5 mm. This is slightly larger than was seen on the study of 01/03/2023. However, looking at this on multiple prior exams, including the study of 09/10/2022, though the overall diameter is increased by few mm, the cephalo caudal thickness is diminished from 5 mm on the 09/10/2022 study. Therefore, what we are seeing today could be treatment effect, but this area should be observed on subsequent studies. No new or definitely progressive parenchymal metastatic lesion is seen. Mild dural enhancement  adjacent to the left frontal bone disease described below appears similar. No hydrocephalus or extra-axial fluid collection. Minimal small vessel change of the white matter is unchanged. Vascular: Major vessels at the base of the brain show flow. Skull and upper cervical spine: Some contraction of the abnormal signal associated with the clivus. No change in the appearance of the left frontal calvarial disease. Sinuses/Orbits: Clear/normal Other: None IMPRESSION: 1. Treated metastatic lesion at the inferior left temporal occipital junction today measures 13 mm front to back, 11 mm right to left, with a thickness of 3.5 mm. This is slightly larger in diameter than was seen on the study of 01/03/2023. However, looking at this on multiple prior exams, including the study of 09/10/2022, though the overall axial plane diameter is increased by few mm, the cephalo caudal thickness is diminished from 5 mm on the 09/10/2022 study. Therefore, what we are seeing today could be treatment effect, but this area should be observed on subsequent studies. 2. No new or progressive parenchymal metastatic lesion is seen elsewhere. 3. Some contraction of the abnormal signal associated with the clivus. No change in the appearance of the left frontal calvarial disease. Electronically Signed   By: Paulina Fusi M.D.   On: 05/09/2023 12:07

## 2023-05-27 NOTE — Progress Notes (Signed)
 DISCONTINUE OFF PATHWAY REGIMEN - Non-Small Cell Lung   [id number]:Bevacizumab  15 mg/kg IV D1 q21 Days:   A cycle is every 21 days:     Bevacizumab -xxxx   **Always confirm dose/schedule in your pharmacy ordering system**  PRIOR TREATMENT: Off Pathway: Bevacizumab  15 mg/kg IV D1 q21 Days  START OFF PATHWAY REGIMEN - Non-Small Cell Lung   [id number]:Gemcitabine  1,000 mg/m2 IV D1,8 q21 Days:   A cycle is every 21 days:     Gemcitabine    **Always confirm dose/schedule in your pharmacy ordering system**  Patient Characteristics: Stage IV Metastatic, Nonsquamous, Molecular Analysis Completed, Molecular Alteration Present and Targeted Therapy Exhausted OR KRAS G12C+ or HER2+ Present and No Prior Chemo/Immunotherapy OR No Alteration Present, Third Line - Chemotherapy/Immunotherapy,  PS = 2 Therapeutic Status: Stage IV Metastatic Histology: Nonsquamous Cell Broad Molecular Profiling Status: Animal Nutritionist Analysis Results: No Alteration Present ECOG Performance Status: 2 Chemotherapy/Immunotherapy Line of Therapy: Third Line Chemotherapy/Immunotherapy Intent of Therapy: Non-Curative / Palliative Intent, Discussed with Patient

## 2023-05-27 NOTE — Telephone Encounter (Signed)
 Per order of Dr. Clista - Please add HER2 IHC testing and C-MET testing on her lung biopsy specimen from 2023. Specimen CASE # [id number]   Diagnosis C34.90- Primary adenocarcinoma of lung, unspecified laterality   Faxed order to Faxed to [phone] along with demographics and path report

## 2023-05-27 NOTE — Telephone Encounter (Signed)
 When pt left exam room  yesterday, pt and daughter had questions about the plan for the torn rotator cuff seen on mri a. They also wanted to know how long pt had the torn cuff and if the cancer caused this.Pt also inquired about a dark skin discoloration on her left lateral neck the size of a quarter. Pt stated that her skin was itching. Area of concern was thicker/raised and darker than the surrounding skin and lumpy. Pt reports this area is not painful but just itches. Pt stated that the area was not there visible on her skin before now and started itching approximately 2 weeks ago. Daughter took a picture and will send this to Dr. DELENA on the [place].    RN brought these concerns to Dr. Ophelia attention. Dr. Clista stated that orthopedic could be consulted if pt desires for consideration for steroid injection. Most likely, pt would not be a surgical candidate and priority at this time is treating the pt's cancer with current recommendations as discussed with pt at the md visit. Pt needs to optimize her pain control to help with any discomfort for the shoulder pain.It is challenging to pinpoint the exact timing of the torn cuff. It may be associated with cancer, or it could have happened at a different time altogether.  Regarding the concerns for the skin discoloration/itching. Dr. DELENA will review the pictures pt will send via mychart and can provide further recommendations at that time.  I called pt's daughter, Victoria Foley with the above recommendations. She will let her mom know the recommendations regarding orthopedics. She stated that over all they were pleased with the conversation with Dr. DELENA yesterday and just wanted to sort out what all could be fixed with radiation and new treatment recommendations. Victoria Foley thanked me for updating her on her mom's care.

## 2023-05-28 ENCOUNTER — Telehealth: Payer: Self-pay | Admitting: *Deleted

## 2023-05-28 ENCOUNTER — Encounter: Payer: Self-pay | Admitting: Family Medicine

## 2023-05-28 ENCOUNTER — Encounter: Payer: Self-pay | Admitting: Internal Medicine

## 2023-05-28 ENCOUNTER — Ambulatory Visit: Payer: PPO | Admitting: Family Medicine

## 2023-05-28 ENCOUNTER — Telehealth: Payer: PPO | Admitting: Family Medicine

## 2023-05-28 VITALS — BP 138/91 | Ht 59.0 in | Wt 134.0 lb

## 2023-05-28 DIAGNOSIS — Z510 Encounter for antineoplastic radiation therapy: Secondary | ICD-10-CM | POA: Diagnosis not present

## 2023-05-28 DIAGNOSIS — C7951 Secondary malignant neoplasm of bone: Secondary | ICD-10-CM

## 2023-05-28 DIAGNOSIS — C3412 Malignant neoplasm of upper lobe, left bronchus or lung: Secondary | ICD-10-CM | POA: Diagnosis not present

## 2023-05-28 DIAGNOSIS — I10 Essential (primary) hypertension: Secondary | ICD-10-CM

## 2023-05-28 MED ORDER — BETAMETHASONE DIPROPIONATE 0.05 % EX CREA: TOPICAL_CREAM | Freq: Two times a day (BID) | CUTANEOUS | 0 refills | Status: AC

## 2023-05-28 NOTE — Progress Notes (Signed)
 Virtual Visit via Video note  I connected with KEYAIRRA KOLINSKI on 05/29/23 at 1154 by video and verified that I am speaking with the correct person using two identifiers. [name] is currently located at home and  is currently with alone during visit. The provider, Glenys Ferrari, MD is located in their office at time of visit.  I discussed the limitations, risks, security and privacy concerns of performing an evaluation and management service by video and the availability of in person appointments. I also discussed with the patient that there may be a patient responsible charge related to this service. The patient expressed understanding and agreed to proceed.  Subjective: PCP: Ferrari Glenys, MD  Chief Complaint  Patient presents with   Medication Consultation     3 Month follow Changed the beta blocker     HPI Discussed the use of AI scribe software for clinical note transcription with the patient, who gave verbal consent to proceed.  History of Present Illness Victoria Foley is a 73 year old female with hypertension and metastatic cancer who presents for follow-up on blood pressure management and pain control.  She experiences significant pain due to metastasis in her spine and a torn rotator cuff in both shoulders. The pain is constant and affects her ability to eat properly, leading to a weight loss of about four pounds in the last week. She takes oxycodone  for pain management, which provides some relief, but she continues to experience pain primarily in her hips and back. She is scheduled to start radiation therapy on her hips and back on the upcoming Monday.  Her blood pressure readings at home are generally around 130/70 mmHg, with occasional readings up to 140 mmHg. She is currently taking losartan  100 mg daily and had previously stopped nebivolol . She notes that her blood pressure was elevated on Monday, which she attributes to being in significant pain.  Recent imaging,  including a PET scan, revealed more metastasis in her spine and a torn rotator cuff. She has a follow-up appointment with her oncologist later this month. She has not experienced any numbness or weakness in her arms, but she finds it difficult to reach overhead with her left arm.  No dizziness, weakness, or falls. No racing heart or chest pain. No numbness or weakness in her arms.   ROS: Per HPI  Current Outpatient Medications:    albuterol  (VENTOLIN  HFA) 108 (90 Base) MCG/ACT inhaler, Inhale 2 puffs into the lungs every 6 (six) hours as needed for wheezing or shortness of breath., Disp: 18 g, Rfl: 0   Azelastine  HCl 137 MCG/SPRAY SOLN, Place 2 sprays into both nostrils 2 (two) times daily., Disp: , Rfl:    betamethasone  dipropionate 0.05 % cream, Apply topically 2 (two) times daily for 10 days., Disp: 30 g, Rfl: 0   Cholecalciferol (VITAMIN D -3 PO), Take by mouth., Disp: , Rfl:    dexamethasone  (DECADRON ) 4 MG tablet, Take 1 tablet (4 mg total) by mouth daily. Take for 3 days after chemotherapy, Disp: 30 tablet, Rfl: 0   fluticasone  (FLONASE ) 50 MCG/ACT nasal spray, USE TWO SPRAYS IN EACH NOSTRIL ONCE DAILY prn, Disp: 16 g, Rfl: 12   lactulose  (CHRONULAC ) 10 GM/15ML solution, Take 15 mLs (10 g total) by mouth 3 (three) times daily., Disp: 236 mL, Rfl: 0   loratadine  (CLARITIN ) 10 MG tablet, TAKE ONE TABLET BY MOUTH DAILY AS NEEDED FOR ALLERGIES, Disp: 90 tablet, Rfl: 3   losartan  (COZAAR ) 100 MG tablet,  Take 1 tablet (100 mg total) by mouth daily., Disp: 90 tablet, Rfl: 3   magic mouthwash w/lidocaine  SOLN, Take 5 mLs by mouth 4 (four) times daily as needed for mouth pain. Sig: Swish/Swallow 5-10 ml four times a day as needed. Dispense 480 ml. 1RF, Disp: 480 mL, Rfl: 1   MULTIPLE VITAMIN PO, Take 1 tablet by mouth daily., Disp: , Rfl:    naloxone  (NARCAN ) nasal spray 4 mg/0.1 mL, SPRAY 1 SPRAY INTO ONE NOSTRIL AS DIRECTED FOR OPIOID OVERDOSE (TURN PERSON ON SIDE AFTER DOSE. IF NO RESPONSE IN 2-3  MINUTES OR PERSON RESPONDS BUT RELAPSES, REPEAT USING A NEW SPRAY DEVICE AND SPRAY INTO THE OTHER NOSTRIL. CALL 911 AFTER USE.) * EMERGENCY USE ONLY *, Disp: 1 each, Rfl: 0   ondansetron  (ZOFRAN ) 8 MG tablet, Take 1 tablet (8 mg total) by mouth every 8 (eight) hours as needed for nausea or vomiting., Disp: 30 tablet, Rfl: 3   oxyCODONE  (OXY IR/ROXICODONE ) 5 MG immediate release tablet, Take 1-2 tablets (5-10 mg total) by mouth every 4 (four) hours as needed for severe pain (pain score 7-10)., Disp: 60 tablet, Rfl: 0   oxyCODONE  ER (XTAMPZA  ER) 9 MG C12A, Take 1 capsule by mouth every 12 (twelve) hours., Disp: 60 capsule, Rfl: 0   senna (SENOKOT) 8.6 MG TABS tablet, Take 1 tablet by mouth daily., Disp: , Rfl:    valACYclovir  (VALTREX ) 500 MG tablet, Take 1 tablet (500 mg total) by mouth 2 (two) times daily. For shingles prophylaxis, Disp: 60 tablet, Rfl: 1   gabapentin  (NEURONTIN ) 100 MG capsule, Take 1 capsule (100 mg total) by mouth 3 (three) times daily., Disp: 90 capsule, Rfl: 0 No current facility-administered medications for this visit.  Facility-Administered Medications Ordered in Other Visits:    heparin  lock flush 100 UNIT/ML injection, , , ,    heparin  lock flush 100 unit/mL, 500 Units, Intravenous, Once, Agrawal, Kavita, MD   sodium chloride  flush (NS) 0.9 % injection 10 mL, 10 mL, Intravenous, Once, Agrawal, Kavita, MD  Observations/Objective: Physical Exam Pulmonary:     Effort: Pulmonary effort is normal.  Neurological:     Mental Status: She is alert and oriented to person, place, and time. Mental status is at baseline.  Psychiatric:        Mood and Affect: Mood normal.        Behavior: Behavior normal.        Thought Content: Thought content normal.        Judgment: Judgment normal.    Assessment and Plan: Essential hypertension Assessment & Plan: Blood pressure readings at home are generally below 140/90. Patient is currently on Losartan  100mg . Discussed the possibility  of adding Amlodipine 2.5mg  if blood pressure consistently rises above 140/90. -Continue Losartan  100mg . -Monitor blood pressure at home. -Consider adding Amlodipine 2.5mg  if blood pressure consistently rises above 140/90.   Metastasis to bone [facility]) Assessment & Plan: Patient reports new metastasis in the spine and is scheduled to start radiation therapy. Pain is managed with Oxycodone . -Continue current oncology management plan.      Follow Up Instructions: Return if symptoms worsen or fail to improve, for PCP.   I discussed the assessment and treatment plan with the patient. The patient was provided an opportunity to ask questions and all were answered. The patient agreed with the plan and demonstrated an understanding of the instructions.   The patient was advised to call back or seek an in-person evaluation if the symptoms worsen or if  the condition fails to improve as anticipated.  The above assessment and management plan was discussed with the patient. The patient verbalized understanding of and has agreed to the management plan. Patient is aware to call the clinic if symptoms persist or worsen. Patient is aware when to return to the clinic for a follow-up visit. Patient educated on when it is appropriate to go to the emergency department.     Glenys Ferrari, MD

## 2023-05-28 NOTE — Telephone Encounter (Signed)
 Daughter, Minnette Amato, had not seen mychart msg reply. So, I personally reached out to her to provide recommendations. She gave verbal understanding  of plan.

## 2023-05-28 NOTE — Telephone Encounter (Signed)
 duplicate

## 2023-05-29 ENCOUNTER — Encounter: Payer: Self-pay | Admitting: Family Medicine

## 2023-05-29 ENCOUNTER — Other Ambulatory Visit: Payer: Self-pay | Admitting: *Deleted

## 2023-05-29 ENCOUNTER — Inpatient Hospital Stay: Payer: PPO | Admitting: Hospice and Palliative Medicine

## 2023-05-29 ENCOUNTER — Ambulatory Visit
Admission: RE | Admit: 2023-05-29 | Discharge: 2023-05-29 | Disposition: A | Discharge status: Home / Self Care | Payer: PPO | Source: Ambulatory Visit | Attending: Radiation Oncology | Admitting: Radiation Oncology

## 2023-05-29 ENCOUNTER — Other Ambulatory Visit: Payer: Self-pay

## 2023-05-29 ENCOUNTER — Encounter: Payer: Self-pay | Admitting: Internal Medicine

## 2023-05-29 ENCOUNTER — Encounter: Payer: Self-pay | Admitting: *Deleted

## 2023-05-29 ENCOUNTER — Encounter: Payer: Self-pay | Admitting: Hospice and Palliative Medicine

## 2023-05-29 ENCOUNTER — Telehealth: Payer: PPO | Admitting: Hospice and Palliative Medicine

## 2023-05-29 DIAGNOSIS — C3490 Malignant neoplasm of unspecified part of unspecified bronchus or lung: Secondary | ICD-10-CM

## 2023-05-29 DIAGNOSIS — R29898 Other symptoms and signs involving the musculoskeletal system: Secondary | ICD-10-CM

## 2023-05-29 DIAGNOSIS — C3412 Malignant neoplasm of upper lobe, left bronchus or lung: Secondary | ICD-10-CM | POA: Diagnosis not present

## 2023-05-29 DIAGNOSIS — C7951 Secondary malignant neoplasm of bone: Secondary | ICD-10-CM | POA: Diagnosis not present

## 2023-05-29 DIAGNOSIS — G893 Neoplasm related pain (acute) (chronic): Secondary | ICD-10-CM

## 2023-05-29 DIAGNOSIS — Z510 Encounter for antineoplastic radiation therapy: Secondary | ICD-10-CM | POA: Diagnosis not present

## 2023-05-29 DIAGNOSIS — Z9181 History of falling: Secondary | ICD-10-CM

## 2023-05-29 LAB — RAD ONC ARIA SESSION SUMMARY
Course Elapsed Days: 0
Plan Fractions Treated to Date: 1
Plan Prescribed Dose Per Fraction: 3 Gy
Plan Total Fractions Prescribed: 10
Plan Total Prescribed Dose: 30 Gy
Reference Point Dosage Given to Date: 3 Gy
Reference Point Session Dosage Given: 3 Gy
Session Number: 1

## 2023-05-29 NOTE — Assessment & Plan Note (Signed)
 Blood pressure readings at home are generally below 140/90. Patient is currently on Losartan  100mg . Discussed the possibility of adding Amlodipine 2.5mg  if blood pressure consistently rises above 140/90. -Continue Losartan  100mg . -Monitor blood pressure at home. -Consider adding Amlodipine 2.5mg  if blood pressure consistently rises above 140/90.

## 2023-05-29 NOTE — Assessment & Plan Note (Addendum)
 Patient reports new metastasis in the spine and is scheduled to start radiation therapy. Pain is managed with Oxycodone . -Continue current oncology management plan.

## 2023-05-29 NOTE — Patient Instructions (Signed)
 It was a pleasure meeting you today. Thank you for allowing me to take part in your health care.  Our goals for today as we discussed include:  Continue to monitor blood pressure.  Goal less than 140/90 If increases can add medication  Follow up with Oncology and Orthopedics  Follow up with PCP as needed  This is a list of the screening recommended for you and due dates:  Health Maintenance  Topic Date Due   DTaP/Tdap/Td vaccine (1 - Tdap) Never done   Zoster (Shingles) Vaccine (1 of 2) Never done   Mammogram  02/28/2023   Colon Cancer Screening  07/22/2023   Flu Shot  07/21/2023*   Pneumonia Vaccine (1 of 2 - PCV) 10/17/2023*   DEXA scan (bone density measurement)  Completed   Hepatitis C Screening  Completed   HPV Vaccine  Aged Out   COVID-19 Vaccine  Discontinued  *Topic was postponed. The date shown is not the original due date.      If you have any questions or concerns, please do not hesitate to call the office at [phone].  I look forward to our next visit and until then take care and stay safe.  Regards,   Glenys Ferrari, MD   [facility]

## 2023-05-29 NOTE — Progress Notes (Signed)
 Virtual Visit via Telephone Note  I connected with Victoria Foley on 05/29/23 at  1:30 PM EST by telephone and verified that I am speaking with the correct person using two identifiers.  Location: Patient: Home Provider: Clinic   I discussed the limitations, risks, security and privacy concerns of performing an evaluation and management service by telephone and the availability of in person appointments. I also discussed with the patient that there may be a patient responsible charge related to this service. The patient expressed understanding and agreed to proceed.   History of Present Illness: Victoria Foley is a 73 y.o. 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  Patient is status post RT to the left hip due to nondisplaced pathologic fracture.  She is status post palliative RT to brain.  She is on systemic chemotherapy.    Observations/Objective: I called and spoke with patient and daughter today by phone.  Patient had PET scan on 05/16/2023, which unfortunately showed progressive osseous metastatic disease.  Patient also had MRI left and right hip and left and right shoulder with skeletal metastatic disease in each of those locations.   Patient says that the oxycodone  and Xtampza  have helped the pain.  However, she has not consistently taking it due to poor oral intake and intermittent nausea.  They asked about alternative long-acting opioids.  I offered to rotate patient to transdermal fentanyl  but she declined this.  Ultimately, she stated that she wanted to leave the current pain regimen.  I encouraged her to more consistently take the pain medications and that she might try scheduled dosing of the ondansetron  to control nausea.  Patient denies other symptomatic complaints or concerns today.  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  Continue Xtampza /oxycodone .  Daily bowel regimen to prevent opioid-induced constipation. On XRT.   Follow Up Instructions: Follow-up with Dr. Clista   I discussed the assessment and treatment plan with the patient. The patient was provided an opportunity to ask questions and all were answered. The patient agreed with the plan and demonstrated an understanding of the instructions.   The patient was advised to call back or seek an in-person evaluation if the symptoms worsen or if the condition fails to improve as anticipated.  I provided 10 minutes of non-face-to-face time during this encounter.   FONDA JONELLE MOWER, NP

## 2023-05-29 NOTE — Telephone Encounter (Signed)
 Personally reach out to patient to [facility] to discuss pt's concerns (mychart msg) regarding pain mgmt and need for [facility] assistance. Patient not eating. Poor appetite-only eating yogurt and boost/ensures. Would appreciate follow-up with Joli to discuss dietary concerns. Patient not optimizing her pain mgmt. Refuses to take pain meds on empty stomach. Pt/family would like to explore alternative pain mgmt with Sidra, NP- Phone call apt set up for this afternoon-at 130- ok per josh.   Pt family would like HH aide, nurse, and PT orders placed. Dr. Clista agreeable. She would like a personal aide for meals and basic adls. They understand insurance would not cover a personal home aid. I explained that we do not need md order for personal aide and provide community resources. I explained that I do know that [facility] is highly recommended agency for the community. I am sure there are others. Daughter is familiar with [facility] and can contact the agency. She would like to proceed with [facility] referral.  Pt would also like RF for appetite stimulant and zofran .- scripts pended for Josh to approve.

## 2023-05-30 ENCOUNTER — Ambulatory Visit: Admission: RE | Admit: 2023-05-30 | Payer: PPO | Source: Ambulatory Visit

## 2023-05-30 ENCOUNTER — Inpatient Hospital Stay (HOSPITAL_BASED_OUTPATIENT_CLINIC_OR_DEPARTMENT_OTHER): Payer: PPO | Admitting: Internal Medicine

## 2023-05-30 ENCOUNTER — Other Ambulatory Visit: Payer: Self-pay

## 2023-05-30 ENCOUNTER — Telehealth: Payer: Self-pay

## 2023-05-30 ENCOUNTER — Ambulatory Visit: Payer: PPO

## 2023-05-30 ENCOUNTER — Ambulatory Visit
Admission: RE | Admit: 2023-05-30 | Discharge: 2023-05-30 | Disposition: A | Discharge status: Home / Self Care | Payer: PPO | Source: Ambulatory Visit | Attending: Radiation Oncology | Admitting: Radiation Oncology

## 2023-05-30 ENCOUNTER — Ambulatory Visit
Admission: RE | Admit: 2023-05-30 | Discharge: 2023-05-30 | Disposition: A | Discharge status: Home / Self Care | Payer: PPO | Source: Ambulatory Visit | Attending: Internal Medicine

## 2023-05-30 ENCOUNTER — Inpatient Hospital Stay: Payer: PPO

## 2023-05-30 ENCOUNTER — Ambulatory Visit
Admission: RE | Admit: 2023-05-30 | Discharge: 2023-05-30 | Disposition: A | Discharge status: Home / Self Care | Payer: PPO | Source: Ambulatory Visit | Attending: Internal Medicine | Admitting: Internal Medicine

## 2023-05-30 ENCOUNTER — Encounter: Payer: Self-pay | Admitting: Internal Medicine

## 2023-05-30 ENCOUNTER — Encounter: Payer: Self-pay | Admitting: *Deleted

## 2023-05-30 VITALS — BP 124/78 | HR 96

## 2023-05-30 VITALS — BP 117/89 | HR 118 | Temp 98.6°F | Resp 14

## 2023-05-30 DIAGNOSIS — C3412 Malignant neoplasm of upper lobe, left bronchus or lung: Secondary | ICD-10-CM | POA: Diagnosis not present

## 2023-05-30 DIAGNOSIS — K6389 Other specified diseases of intestine: Secondary | ICD-10-CM | POA: Diagnosis not present

## 2023-05-30 DIAGNOSIS — R948 Abnormal results of function studies of other organs and systems: Secondary | ICD-10-CM

## 2023-05-30 DIAGNOSIS — E860 Dehydration: Secondary | ICD-10-CM | POA: Insufficient documentation

## 2023-05-30 DIAGNOSIS — R112 Nausea with vomiting, unspecified: Secondary | ICD-10-CM

## 2023-05-30 DIAGNOSIS — R7989 Other specified abnormal findings of blood chemistry: Secondary | ICD-10-CM

## 2023-05-30 DIAGNOSIS — Z95828 Presence of other vascular implants and grafts: Secondary | ICD-10-CM

## 2023-05-30 DIAGNOSIS — Z510 Encounter for antineoplastic radiation therapy: Secondary | ICD-10-CM | POA: Diagnosis not present

## 2023-05-30 DIAGNOSIS — E876 Hypokalemia: Secondary | ICD-10-CM

## 2023-05-30 DIAGNOSIS — C7951 Secondary malignant neoplasm of bone: Secondary | ICD-10-CM | POA: Diagnosis not present

## 2023-05-30 DIAGNOSIS — G893 Neoplasm related pain (acute) (chronic): Secondary | ICD-10-CM

## 2023-05-30 LAB — CMP (CANCER CENTER ONLY)
ALT: 48 U/L — ABNORMAL HIGH (ref 0–44)
AST: 48 U/L — ABNORMAL HIGH (ref 15–41)
Albumin: 3.4 g/dL — ABNORMAL LOW (ref 3.5–5.0)
Alkaline Phosphatase: 172 U/L — ABNORMAL HIGH (ref 38–126)
Anion gap: 9 (ref 5–15)
BUN: 25 mg/dL — ABNORMAL HIGH (ref 8–23)
CO2: 21 mmol/L — ABNORMAL LOW (ref 22–32)
Calcium: 8.6 mg/dL — ABNORMAL LOW (ref 8.9–10.3)
Chloride: 101 mmol/L (ref 98–111)
Creatinine: 1.24 mg/dL — ABNORMAL HIGH (ref 0.44–1.00)
GFR, Estimated: 47 mL/min — ABNORMAL LOW (ref >60)
Glucose, Bld: 146 mg/dL — ABNORMAL HIGH (ref 70–99)
Potassium: 3.4 mmol/L — ABNORMAL LOW (ref 3.5–5.1)
Sodium: 131 mmol/L — ABNORMAL LOW (ref 135–145)
Total Bilirubin: 0.8 mg/dL (ref 0.0–1.2)
Total Protein: 7.3 g/dL (ref 6.5–8.1)

## 2023-05-30 LAB — RAD ONC ARIA SESSION SUMMARY
Course Elapsed Days: 1
Plan Fractions Treated to Date: 2
Plan Prescribed Dose Per Fraction: 3 Gy
Plan Total Fractions Prescribed: 10
Plan Total Prescribed Dose: 30 Gy
Reference Point Dosage Given to Date: 6 Gy
Reference Point Session Dosage Given: 3 Gy
Session Number: 2

## 2023-05-30 LAB — CBC WITH DIFFERENTIAL (CANCER CENTER ONLY)
Abs Immature Granulocytes: 0.08 10*3/uL — ABNORMAL HIGH (ref 0.00–0.07)
Basophils Absolute: 0 10*3/uL (ref 0.0–0.1)
Basophils Relative: 0 %
Eosinophils Absolute: 0 10*3/uL (ref 0.0–0.5)
Eosinophils Relative: 0 %
HCT: 29.8 % — ABNORMAL LOW (ref 36.0–46.0)
Hemoglobin: 10.2 g/dL — ABNORMAL LOW (ref 12.0–15.0)
Immature Granulocytes: 1 %
Lymphocytes Relative: 8 %
Lymphs Abs: 0.8 10*3/uL (ref 0.7–4.0)
MCH: 33.8 pg (ref 26.0–34.0)
MCHC: 34.2 g/dL (ref 30.0–36.0)
MCV: 98.7 fL (ref 80.0–100.0)
Monocytes Absolute: 0.5 10*3/uL (ref 0.1–1.0)
Monocytes Relative: 5 %
Neutro Abs: 8.7 10*3/uL — ABNORMAL HIGH (ref 1.7–7.7)
Neutrophils Relative %: 86 %
Platelet Count: 124 10*3/uL — ABNORMAL LOW (ref 150–400)
RBC: 3.02 MIL/uL — ABNORMAL LOW (ref 3.87–5.11)
RDW: 12.3 % (ref 11.5–15.5)
WBC Count: 10.1 10*3/uL (ref 4.0–10.5)
nRBC: 0 % (ref 0.0–0.2)

## 2023-05-30 LAB — AMYLASE: Amylase: 96 U/L (ref 28–100)

## 2023-05-30 MED ORDER — HEPARIN SOD (PORK) LOCK FLUSH 100 UNIT/ML IV SOLN
500.0000 [IU] | Freq: Once | INTRAVENOUS | Status: AC
  Administered 2023-05-30: 500 [IU] via INTRAVENOUS
  Filled 2023-05-30: qty 5

## 2023-05-30 MED ORDER — SODIUM CHLORIDE 0.9 % IV SOLN
Freq: Once | INTRAVENOUS | Status: AC
  Filled 2023-05-30: qty 250

## 2023-05-30 MED ORDER — OLANZAPINE 5 MG PO TABS: 5.0000 mg | ORAL_TABLET | Freq: Every day | ORAL | 3 refills | Status: DC

## 2023-05-30 MED ORDER — ONDANSETRON HCL 8 MG PO TABS: 8.0000 mg | ORAL_TABLET | Freq: Three times a day (TID) | ORAL | 3 refills | Status: DC | PRN

## 2023-05-30 MED ORDER — SODIUM CHLORIDE 0.9 % IV SOLN
Freq: Once | INTRAVENOUS | Status: DC
Start: 2023-05-30 — End: 2023-05-30
  Filled 2023-05-30: qty 250

## 2023-05-30 MED ORDER — SODIUM CHLORIDE 0.9 % IV SOLN
Freq: Once | INTRAVENOUS | Status: DC
  Filled 2023-05-30: qty 250

## 2023-05-30 MED ORDER — ONDANSETRON HCL 4 MG/2ML IJ SOLN
4.0000 mg | Freq: Once | INTRAMUSCULAR | Status: DC
Start: 2023-05-30 — End: 2023-05-30

## 2023-05-30 MED ORDER — ONDANSETRON HCL 4 MG/2ML IJ SOLN
4.0000 mg | Freq: Once | INTRAMUSCULAR | Status: AC
  Administered 2023-05-30: 4 mg via INTRAVENOUS
  Filled 2023-05-30: qty 2

## 2023-05-30 MED ORDER — POTASSIUM CHLORIDE 20 MEQ/100ML IV SOLN
20.0000 meq | Freq: Once | INTRAVENOUS | Status: AC
  Administered 2023-05-30: 20 meq via INTRAVENOUS

## 2023-05-30 NOTE — Telephone Encounter (Signed)
 Call received from patient's daughter Niki, stating that pt has been vomiting and needs zofran  and appetite stimulant sent to [facility] on [address]. Looks like medication is pending approval. Leandra also would like for pt to get IVF when she comes in for radiation today. Spoke to Dr. DELENA and she will see pt in clinic today. Pt has been scheduled for Lab/Dr. A/ IVF today. Daughter aware.

## 2023-05-30 NOTE — Patient Instructions (Signed)
 Tums - you can take 2-3 times a day as needed for heartburn

## 2023-05-30 NOTE — Progress Notes (Signed)
 [facility] OFFICE PROGRESS NOTE  Patient Care Team: Hope Merle, MD as PCP - General (Family Medicine) Verdene Gills, RN as Oncology Nurse Navigator Clista Bimler, MD as Consulting Physician (Oncology) Francisca Redell BROCKS, MD as Consulting Physician (Urology)  TREATMENT:  Right hip palliative RT 30 cGy completed 12/18/2021 Carboplatin , alimta  and Keytruda  x 4 cycles completed 02/19/22.  Discontinued maintenance alimta  (04-23-22) due to worsening renal dysfunction, transaminitis and cytopenias Keytruda  maintenance-discontinued on 09/17/2022 due to disease progression Palliative RT to left frontal lesion 5 fx completed 09/27/22 Palliative RT to left hip completed September 2024 Carboplatin  (AUC 4) and paclitaxel  150 mg/m2 - x 5 cycles ( 10/07/2022- 01/21/2023). Maintenance Avastin  15 mg/kg- 03/27/2023- 04/24/23  ASSESSMENT & PLAN:   # Nausea and vomiting -Started yesterday.  Vomited about 3-4 episodes.  No blood. -Her last bowel movement was yesterday.  She tells me she can do better with her bowel movements.  Her abdomen is nontender, soft to touch. -Will obtain x-ray abdomen 2 view to rule out bowel obstruction.  She has been on opioids and not regular with her bowel regimen. -Labs reviewed.  Sodium mildly low at 131, potassium 3.4.  Creatinine stable at 1.24.  AST/ALT 48/48 slightly above ULN.  ALP stable.  Total bili is normal.  Will add serum lipase/amylase. -Will proceed with IV fluid 1 L normal saline, 20 mill equivalent KCl, 4 mg Zofran .  Monitor LFTs for now.  She is nontender on exam. -Use Tums 2-3 times a day for heartburn. -Sent prescription for Zofran  8 mg every 8 as needed.  Olanzapine  5 mg daily for symptom management for appetite, nausea vomiting. -Schedule for [facility] visit, labs, fluids on Monday to coordinate with rad Onc appointment.  # Bilateral shoulder pain -MRI of the shoulder showed rotator cuff injury.  Patient would like to know if there is any kind of  intervention that can be done. -Requesting if we can directly reach out to the orthopedics at [facility] clinic which they saw last year.  On care everywhere, last seen by Dr. Avanell notes in July 2024.  I have placed a call to their office to review the images and see if there would be any local therapy such as cortisone shots.  # Rash on the nape of the neck -Unclear etiology.  Could be a drug reaction, irritation of skin, mild infection, ?  Related to cancer. -On review today, her lesion is looking improved.  I did send a prescription for betamethasone  previously.  I offered an option that we can wait at this time since the lesion is looking better. -Apply Benadryl  cream for itching.  # Primary lung adenocarcinoma (HCC), Stage IV, PDL1 1% -Progressed through first-line carbo Alimta , Keytruda  and then Keytruda  maintenance on 09/17/2022.  -s/p second line carbo AUC 4 and Taxol  150 mg/m on 10/07/2022.  Completed 5 cycles and then discontinued due to severe cytopenias.  Patient agreed for maintenance Avastin  (was not added with chemo because patient was hesitant).  Progressed through maintenance Avastin .  Last dose 04/24/2023.  -Foundation 1 from August 2023 no targets.  Liquid foundation 1 CDX from June 2024 with no targetable mutation.  Repeat lung biopsy on 12/17/2022 sent for foundation 1 CDX testing was limited due to inadequate sample but no targets seen.  RNA fusion panel was sent on primary biopsy from August 2023.  It could not be run due to low tumor purity.  -Due to increasing joint pains, PET CT scan was done.  The results  were available on Wednesday and was discussed in the tumor conference on Thursday. Concern for progression of bone disease.  My nurse, Powell had reached out to the patient to respond to questions received through MyChart.  Patient requested all this information to be available to Dr. Orval at [facility].  I placed a call to him right away.  She had a telemedicine visit with Dr.  Orval on Monday and was recommended gemcitabine  single agent after completion of radiation to lumbar spine.  Treatment plan placed for gemcitabine  day 1 day 8 every 21-day cycle.  Will consider starting at 800 mg/m dosing since this is her third line and concerns for cytopenias.  We have also sent a request to add on HER2 IHC and c-Met to pathology.  -Continue with care coordination with Dr. Orval at [facility].    # Constipation -Continue with Senokot, MiraLAX as needed.  # Cancer-related pain -Follows with palliative care. 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   -Follow-up with Dr. Buckley.  Dr. Buckley discussed the case with Dr. Camelia who plans to hold off on radiation at this time.  Since the growth is minimal and patient is asymptomatic. -Follow-up MRI brain scheduled in April 2025.  # Chemotherapy induced peripheral neuropathy -Stable.  Started on gabapentin  100 mg 3 times daily, taper as tolerated.  #CKD -secondary to Alimta . Cr stabilized.  -will continue to monitor.  # Chemotherapy-induced anemia  -Aranesp  300 mcg as needed.   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 Camelia on 12/18/2021 -Completed palliative RT to left hip on 01/01/2023.   -Was seen by [facility] orth[facility].  No surgical intervention needed. -Plan for palliative RT to lumbar spine for pain control  # Metastatic bone lesions -Xgeva  120 mg subcu monthly for now.  # Access - port  RTC in 3 weeks for MD visit, labs, cycle 1 gemcitabine   Orders Placed This Encounter  Procedures   DG Abd 2 Views    Standing Status:   Future    Expected Date:   05/30/2023    Expiration Date:   05/29/2024    Reason for Exam (SYMPTOM  OR DIAGNOSIS REQUIRED):   rule out bowel  obstruction    Preferred imaging location?:   [facility]   Amylase    Standing Status:   Future    Expiration Date:   05/29/2024   Lipase    Standing Status:   Future    Expiration Date:   05/29/2024    [facility]- 12/06/2021   All questions were answered. The patient knows to call the clinic with any problems, questions or concerns. The total time spent in the appointment was 30 minutes encounter with patients including review of chart and various tests results, discussions about plan of care and coordination of care plan   Genevia Rous, MD 05/30/2023 12:45 PM  INTERVAL HISTORY: Patient seen today as follow-up accompanied with daughter for management of stage IV lung adenocarcinoma. Reports nausea and vomiting with started yesterday.  Had about 3-4 episodes.  Her last bowel movement was yesterday but tells me she can do better with her bowel regiment.  Has not been able to keep up with the bowel regimen.  Denies any fevers, chills, new onset shortness of breath  or chest pain.  Denies any abdominal pain, diarrhea.  Oral intake has been poor.  REVIEW OF SYSTEMS:   Positive ROS as above.  Rest 10 points ROS negative   I have reviewed the past medical history, past surgical history, social history and family history with the patient and they are unchanged from previous note.  ALLERGIES:  is allergic to diclofenac, lisinopril, and sulfa  antibiotics.  MEDICATIONS:  Current Outpatient Medications  Medication Sig Dispense Refill   albuterol  (VENTOLIN  HFA) 108 (90 Base) MCG/ACT inhaler Inhale 2 puffs into the lungs every 6 (six) hours as needed for wheezing or shortness of breath. 18 g 0   Azelastine  HCl 137 MCG/SPRAY SOLN Place 2 sprays into both nostrils 2 (two) times daily.     betamethasone  dipropionate 0.05 % cream Apply topically 2 (two) times daily for 10 days. 30 g 0   Cholecalciferol (VITAMIN D -3 PO) Take by mouth.     dexamethasone  (DECADRON ) 4 MG tablet Take 1 tablet (4  mg total) by mouth daily. Take for 3 days after chemotherapy 30 tablet 0   fluticasone  (FLONASE ) 50 MCG/ACT nasal spray USE TWO SPRAYS IN EACH NOSTRIL ONCE DAILY prn 16 g 12   gabapentin  (NEURONTIN ) 100 MG capsule Take 1 capsule (100 mg total) by mouth 3 (three) times daily. 90 capsule 0   lactulose  (CHRONULAC ) 10 GM/15ML solution Take 15 mLs (10 g total) by mouth 3 (three) times daily. 236 mL 0   loratadine  (CLARITIN ) 10 MG tablet TAKE ONE TABLET BY MOUTH DAILY AS NEEDED FOR ALLERGIES 90 tablet 3   losartan  (COZAAR ) 100 MG tablet Take 1 tablet (100 mg total) by mouth daily. 90 tablet 3   magic mouthwash w/lidocaine  SOLN Take 5 mLs by mouth 4 (four) times daily as needed for mouth pain. Sig: Swish/Swallow 5-10 ml four times a day as needed. Dispense 480 ml. 1RF 480 mL 1   MULTIPLE VITAMIN PO Take 1 tablet by mouth daily.     naloxone  (NARCAN ) nasal spray 4 mg/0.1 mL SPRAY 1 SPRAY INTO ONE NOSTRIL AS DIRECTED FOR OPIOID OVERDOSE (TURN PERSON ON SIDE AFTER DOSE. IF NO RESPONSE IN 2-3 MINUTES OR PERSON RESPONDS BUT RELAPSES, REPEAT USING A NEW SPRAY DEVICE AND SPRAY INTO THE OTHER NOSTRIL. CALL 911 AFTER USE.) * EMERGENCY USE ONLY * 1 each 0   OLANZapine  (ZYPREXA ) 5 MG tablet Take 1 tablet (5 mg total) by mouth at bedtime. For appetite 30 tablet 3   ondansetron  (ZOFRAN ) 8 MG tablet Take 1 tablet (8 mg total) by mouth every 8 (eight) hours as needed for nausea or vomiting. 30 tablet 3   oxyCODONE  (OXY IR/ROXICODONE ) 5 MG immediate release tablet Take 1-2 tablets (5-10 mg total) by mouth every 4 (four) hours as needed for severe pain (pain score 7-10). 60 tablet 0   oxyCODONE  ER (XTAMPZA  ER) 9 MG C12A Take 1 capsule by mouth every 12 (twelve) hours. 60 capsule 0   senna (SENOKOT) 8.6 MG TABS tablet Take 1 tablet by mouth daily.     valACYclovir  (VALTREX ) 500 MG tablet Take 1 tablet (500 mg total) by mouth 2 (two) times daily. For shingles prophylaxis 60 tablet 1   No current facility-administered  medications for this visit.   Facility-Administered Medications Ordered in Other Visits  Medication Dose Route Frequency Provider Last Rate Last Admin   0.9 %  sodium chloride  infusion   Intravenous Once Yu, Zhou, MD       0.9 %  sodium chloride  infusion  Intravenous Once Phylicia Mcgaugh, MD       0.9 %  sodium chloride  infusion   Intravenous Once Callen Vancuren, MD 999 mL/hr at 05/30/23 1157 New Bag at 05/30/23 1157   heparin  lock flush 100 UNIT/ML injection            heparin  lock flush 100 unit/mL  500 Units Intravenous Once Lavonne Cass, MD       heparin  lock flush 100 unit/mL  500 Units Intravenous Once Kraig Genis, MD       ondansetron  (ZOFRAN ) injection 4 mg  4 mg Intravenous Once Yu, Zhou, MD       potassium chloride  20 mEq in 100 mL IVPB  20 mEq Intravenous Once Elwyn Klosinski, MD 100 mL/hr at 05/30/23 1207 20 mEq at 05/30/23 1207   sodium chloride  flush (NS) 0.9 % injection 10 mL  10 mL Intravenous Once Chilton Sallade, MD        SUMMARY OF ONCOLOGIC HISTORY: Oncology History  Primary lung adenocarcinoma (HCC)  10/10/2021 Initial Diagnosis   Primary lung adenocarcinoma (HCC) Stage IV  Patient seen by PCP for wheezing for 2 weeks. Imaging with CXR followed by CT chest showed Spiculated 2.1 x 2.2 cm left upper lobe pulmonary nodule with associated pleural tethering. Innumerable subcentimeter pulmonary nodules throughout the lungs.      11/14/2021 PET scan   IMPRESSION: 1. Left suprahilar nodule with maximum SUV 4.5, and a more distal 2.2 cm left upper lobe nodule with maximum SUV of 5.5. 2. Innumerable scattered small pulmonary nodules, most of which are under 5 mm in diameter, throughout both lungs. Some are cavitary. Possibilities include cavitating hematogenous disseminated malignancy versus infectious etiology subset as septic emboli. Most of the nodules are stable, some are minimally enlarged compared to 10/10/2021. 3. Abnormal mild permeative type bony findings  in the right hemipelvis associated with substantially accentuated metabolic activity. Possible associated pathologic fracture through the right quadrilateral plate and right acetabulum. The findings involve the right ilium and ischium but also the right lateral sacral ala and a small focus in the left sacrum.    Pathology Results   She had transbronchial biopsies by Dr. Tamea on 11/19/21.  Pathology from LUL nodule showed adenocarcinoma, consistent with lung primary.    11/19/2021 Cancer Staging   Staging form: Lung, AJCC 8th Edition - Clinical stage from 11/19/2021: Stage IVB (cT1c, cM1c) - Signed by Clista Bimler, MD on 11/27/2021 Stage prefix: Initial diagnosis   11/27/2021 Imaging   MRI pelvis  IMPRESSION: 1. Abnormal marrow signal throughout the right ilium involving the right acetabulum, right superior pubic ramus and right inferior pubic ramus, right side of the sacrum and a smaller focus of abnormal marrow lesion in the left side of the sacrum. Findings are concerning for metastatic disease. 2. Nondisplaced pathologic fracture of the quadrilateral plate of the right acetabulum. 3. Mild muscle edema in the right gluteus minimus and medius muscles likely reflecting mild muscle strain.  MRI brain  IMPRESSION: No evidence of intracranial metastatic disease.   Indeterminate 1 cm left frontal calvarium lesion.   12/18/2021 - 08/27/2022 Chemotherapy   Patient is on Treatment Plan : LUNG Carboplatin  (5) + Pemetrexed  (500) + Pembrolizumab  (200) D1 q21d Induction x 4 cycles / Maintenance Pemetrexed  (500) + Pembrolizumab  (200) D1 q21d     10/07/2022 - 01/21/2023 Chemotherapy   Patient is on Treatment Plan : LUNG Carboplatin  + Paclitaxel  q21d Dose Reduction     03/27/2023 - 04/24/2023 Chemotherapy   Patient is on  Treatment Plan : LUNG Bevacizumab  q21d     06/16/2023 -  Chemotherapy   Patient is on Treatment Plan : LUNG Gemcitabine  (1000) D1,8 q21d     Primary malignant neoplasm of left  upper lobe of lung (HCC)  06/04/2022 Initial Diagnosis   Primary malignant neoplasm of left upper lobe of lung (HCC)   06/04/2022 Cancer Staging   Staging form: Lung, AJCC 8th Edition - Pathologic: Stage IVB (pT1c, pNX, cM1c) - Signed by Rennie Cindy SAUNDERS, MD on 06/04/2022     PHYSICAL EXAMINATION: ECOG PERFORMANCE STATUS: 2 - Symptomatic, <50% confined to bed  Vitals:   05/30/23 1058  BP: 117/89  Pulse: (!) 118  Resp: 14  Temp: 98.6 F (37 C)  SpO2: 100%    There were no vitals filed for this visit.    Physical Exam Constitutional:      Appearance: Normal appearance.  HENT:     Head: Normocephalic and atraumatic.  Cardiovascular:     Rate and Rhythm: Normal rate.  Pulmonary:     Effort: Pulmonary effort is normal.  Musculoskeletal:     Right lower leg: No edema.     Left lower leg: No edema.  Skin:    General: Skin is warm.  Neurological:     General: No focal deficit present.     Mental Status: She is oriented to person, place, and time.  Psychiatric:        Mood and Affect: Mood normal.     LABORATORY DATA:  I have reviewed the data as listed    Component Value Date/Time   NA 131 (L) 05/30/2023 1039   K 3.4 (L) 05/30/2023 1039   CL 101 05/30/2023 1039   CO2 21 (L) 05/30/2023 1039   GLUCOSE 146 (H) 05/30/2023 1039   BUN 25 (H) 05/30/2023 1039   CREATININE 1.24 (H) 05/30/2023 1039   CALCIUM  8.6 (L) 05/30/2023 1039   PROT 7.3 05/30/2023 1039   ALBUMIN 3.4 (L) 05/30/2023 1039   AST 48 (H) 05/30/2023 1039   ALT 48 (H) 05/30/2023 1039   ALKPHOS 172 (H) 05/30/2023 1039   BILITOT 0.8 05/30/2023 1039   GFRNONAA 47 (L) 05/30/2023 1039    No results found for: SPEP, UPEP  Lab Results  Component Value Date   WBC 10.1 05/30/2023   NEUTROABS 8.7 (H) 05/30/2023   HGB 10.2 (L) 05/30/2023   HCT 29.8 (L) 05/30/2023   MCV 98.7 05/30/2023   PLT 124 (L) 05/30/2023      Chemistry      Component Value Date/Time   NA 131 (L) 05/30/2023 1039   K  3.4 (L) 05/30/2023 1039   CL 101 05/30/2023 1039   CO2 21 (L) 05/30/2023 1039   BUN 25 (H) 05/30/2023 1039   CREATININE 1.24 (H) 05/30/2023 1039      Component Value Date/Time   CALCIUM  8.6 (L) 05/30/2023 1039   ALKPHOS 172 (H) 05/30/2023 1039   AST 48 (H) 05/30/2023 1039   ALT 48 (H) 05/30/2023 1039   BILITOT 0.8 05/30/2023 1039       RADIOGRAPHIC STUDIES: I have personally reviewed the radiological images as listed and agreed with the findings in the report. MR SHOULDER LEFT W WO CONTRAST Result Date: 05/21/2023 CLINICAL DATA:  Metastatic non-small cell lung cancer EXAM: MRI OF THE LEFT SHOULDER WITHOUT AND WITH CONTRAST TECHNIQUE: Multiplanar, multisequence MR imaging of the left shoulder was performed before and after the administration of intravenous contrast. CONTRAST:  6mL GADAVIST  GADOBUTROL  1  MMOL/ML IV SOLN COMPARISON:  PET-CT 05/16/2023 FINDINGS: Rotator cuff: Moderate-severe supraspinatus tendinosis with full-thickness, near-complete tear of the distal tendon with approximately 1.0 cm of tendon retraction. Moderate infraspinatus and mild subscapularis tendinosis without tear. Intact teres minor. Muscles: Preserved bulk and signal intensity of the rotator cuff musculature without edema, atrophy, or fatty infiltration. Biceps long head:  Mild intra-articular biceps tendinosis. Acromioclavicular Joint: Mild-to-moderate degenerative changes of the AC joint. Moderate-severe subacromial-subdeltoid bursitis. Glenohumeral Joint: No joint effusion. No cartilage defect. Labrum:  No evidence of labral tear. No paralabral cyst. Bones: No acute fracture. No dislocation. Subcentimeter marrow replacing bone lesion within the distal clavicle. Additional bone lesions within the posterolateral left fifth rib and anterior left third rib. Left third rib pathologic fracture, better seen by recent PET-CT. Other: None. IMPRESSION: LEFT SHOULDER: 1. Bone metastases within the distal clavicle, left third rib,  and left fifth rib. Left third rib pathologic fracture, better seen by recent PET-CT. 2. Moderate-severe supraspinatus tendinosis with full-thickness, near-complete tear of the distal tendon. 3. Moderate infraspinatus and mild subscapularis tendinosis without tear. 4. Mild intra-articular biceps tendinosis. 5. Moderate-severe subacromial-subdeltoid bursitis. Electronically Signed   By: Mabel Converse D.O.   On: 05/21/2023 08:30   MR SHOULDER RIGHT W WO CONTRAST Result Date: 05/21/2023 CLINICAL DATA:  Metastatic non-small cell lung cancer EXAM: MRI OF THE RIGHT SHOULDER WITHOUT AND WITH CONTRAST TECHNIQUE: Multiplanar, multisequence MR imaging of the right shoulder was performed before and after the administration of intravenous contrast. CONTRAST:  6mL GADAVIST  GADOBUTROL  1 MMOL/ML IV SOLN COMPARISON:  PET-CT 05/15/2018 FINDINGS: Rotator cuff: Severe rotator cuff tendinosis. Full-thickness, near-complete tears of the supraspinatus and infraspinatus tendons with retraction to the level of the humeral head apex. A portion of each tendon does remain intact. Mild subscapularis tendinosis without tear. Intact teres minor. Muscles:  Mild supraspinatus muscle atrophy. Biceps long head: Severe tendinosis and high-grade partial tearing of the intra-articular biceps tendon. Acromioclavicular Joint: Moderate degenerative changes of the AC joint. Mild-moderate subacromial-subdeltoid bursitis. Glenohumeral Joint: Trace glenohumeral joint effusion. Small intra-articular loose body in the axillary pouch. Mild chondral thinning. Labrum:  Superior labral degeneration. Bones: 1.8 cm marrow replacing bone lesion within the anteromedial aspect of the humeral head with patchy postcontrast enhancement. No pathologic fracture. No additional marrow replacing bone lesions. No fracture or dislocation. Other: No lymphadenopathy within the included right axilla. IMPRESSION: RIGHT SHOULDER: 1. A 1.8 cm marrow replacing bone lesion within  the humeral head, compatible with metastatic disease. No pathologic fracture. 2. Severe rotator cuff tendinosis with full-thickness, near-complete tears of the supraspinatus and infraspinatus tendons. Mild supraspinatus muscle atrophy. 3. Severe tendinosis and high-grade partial tearing of the intra-articular biceps tendon. 4. Moderate AC joint osteoarthritis. Mild-moderate subacromial-subdeltoid bursitis. 5. Mild glenohumeral osteoarthritis. Electronically Signed   By: Mabel Converse D.O.   On: 05/21/2023 08:21   MR HIP RIGHT W WO CONTRAST Result Date: 05/21/2023 CLINICAL DATA:  Metastatic non-small cell lung cancer EXAM: MRI OF THE RIGHT HIP WITHOUT AND WITH CONTRAST MRI OF THE LEFT HIP WITHOUT AND WITH CONTRAST TECHNIQUE: Multiplanar, multisequence MR imaging was performed both before and after administration of intravenous contrast. CONTRAST:  6mL GADAVIST  GADOBUTROL  1 MMOL/ML IV SOLN COMPARISON:  X-ray 05/09/2023, PET-CT 05/16/2023, MRI 11/20/2022 FINDINGS: Bones/Joint/Cartilage Numerous marrow-replacing metastatic bone lesions throughout the pelvis, lower lumbar spine, and proximal left femur. Several of the lesions demonstrate postcontrast enhancement. Overall, there has been slight progression compared to the 11/20/2022 MRI. Postsurgical changes from ORIF the left femur. Chronic pathologic fracture of  the right acetabulum. No evidence of an acute pathologic fracture. No pelvic diastasis. Trace bilateral SI joint effusions. Moderate osteoarthritic changes of both hips. No hip joint effusion. No femoral head avascular necrosis. Ligaments Intact. Muscles and Tendons No acute musculotendinous abnormality. Soft tissues No soft tissue edema or fluid collection. No pelvic or inguinal lymphadenopathy. No extraosseous mass lesion. IMPRESSION: 1. 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 Overall, there has been slight progression compared  to the 11/20/2022 MRI. 2. 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 No evidence of an acute pathologic fracture. Electronically Signed   By: Mabel Converse D.O.   On: 05/21/2023 08:15   MR HIP LEFT W WO CONTRAST Result Date: 05/21/2023 CLINICAL DATA:  Metastatic non-small cell lung cancer EXAM: MRI OF THE RIGHT HIP WITHOUT AND WITH CONTRAST MRI OF THE LEFT HIP WITHOUT AND WITH CONTRAST TECHNIQUE: Multiplanar, multisequence MR imaging was performed both before and after administration of intravenous contrast. CONTRAST:  6mL GADAVIST  GADOBUTROL  1 MMOL/ML IV SOLN COMPARISON:  X-ray 05/09/2023, PET-CT 05/16/2023, MRI 11/20/2022 FINDINGS: Bones/Joint/Cartilage Numerous marrow-replacing metastatic bone lesions throughout the pelvis, lower lumbar spine, and proximal left femur. Several of the lesions demonstrate postcontrast enhancement. Overall, there has been slight progression compared to the 11/20/2022 MRI. Postsurgical changes from ORIF the left femur. 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 No evidence of an acute pathologic fracture. No pelvic diastasis. Trace bilateral SI joint effusions. Moderate osteoarthritic changes of both hips. No hip joint effusion. No femoral head avascular necrosis. Ligaments Intact. Muscles and Tendons No acute musculotendinous abnormality. Soft tissues No soft tissue edema or fluid collection. No pelvic or inguinal lymphadenopathy. No extraosseous mass lesion. IMPRESSION: 1. 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 Overall, there has been slight progression compared to the 11/20/2022 MRI. 2. 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 No evidence of an acute pathologic fracture. Electronically Signed   By: Mabel Converse D.O.   On: 05/21/2023 08:15   NM PET Image Restag (PS) Skull Base To Thigh Result Date: 05/16/2023 CLINICAL DATA:  Subsequent treatment strategy for  non-small cell lung cancer. EXAM: NUCLEAR MEDICINE PET SKULL BASE TO THIGH TECHNIQUE: 7.58 mCi F-18 FDG was injected intravenously. Full-ring PET imaging was performed from the skull base to thigh after the radiotracer. CT data was obtained and used for attenuation correction and anatomic localization. Fasting blood glucose: 52 mg/dl COMPARISON:  Whole-body bone scan 04/10/2023, CT of the chest, abdomen and pelvis 02/24/2023 and PET-CT 11/14/2021 FINDINGS: Mediastinal blood pool activity: SUV max NECK: There is extensive brown fat activity throughout the neck and shoulder regions, limiting sensitivity. Allowing for this, no definite hypermetabolic cervical lymph nodes are identified.Fairly symmetric activity within the lymphoid tissue of Waldeyer's ring is within physiologic limits. No suspicious activity identified within the pharyngeal mucosal space. Incidental CT findings: none CHEST: As in the neck, there is prominent brown fat activity in the paraspinal regions, limiting sensitivity. Allowing for this, no hypermetabolic mediastinal, hilar or axillary lymph nodes are identified. Residual spiculated left upper lobe nodule measures 2.0 x 1.4 cm on image 34/6, unchanged from recent chest CT. This demonstrates intermediate metabolic activity (SUV max 5.9), previously 5.5. Diffuse septal thickening and perilymphatic nodularity throughout both lungs are grossly stable, without significant hypermetabolic activity. Incidental CT findings: Right IJ Port-A-Cath extends into the lower right atrium. ABDOMEN/PELVIS: There is no hypermetabolic activity within the liver, adrenal glands, spleen or  pancreas. There is no hypermetabolic nodal activity in the abdomen or pelvis. Incidental CT findings: Cholelithiasis. Minimal aortic atherosclerosis. Prominent stool throughout the colon. SKELETON: There is multifocal hypermetabolic osseous metastatic disease which has progressed from the previous PET-CT. There is intense  hypermetabolic activity within a C7 sclerotic metastasis (SUV max 11.5). Multiple hypermetabolic lesions are present within the lumbar spine, with near complete replacement the L1 vertebral body and its posterior elements (SUV max 11.8). Significant involvement also demonstrated within the L3, L4 and L5 vertebral bodies. There is also significant involvement within the left 3rd rib (SUV max 10.2), both sacral ala and both iliac bones. Two focal areas of hypermetabolic activity also present within proximal left femur status post ORIF, suspicious for residual metastatic disease. Incidental CT findings: none IMPRESSION: 1. Multifocal hypermetabolic osseous metastatic disease, progressed from previous PET-CT. 2. Residual spiculated left upper lobe nodule with intermediate metabolic activity, consistent with residual primary bronchogenic carcinoma. 3. 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 4. No evidence of hypermetabolic nodal or visceral metastatic disease. 5. Extensive brown fat activity throughout the neck and shoulder regions, limiting sensitivity. 6.  Aortic Atherosclerosis (ICD10-I70.0). Electronically Signed   By: Elsie Perone M.D.   On: 05/16/2023 12:31   DG HIPS BILAT WITH PELVIS MIN 5 VIEWS Result Date: 05/09/2023 CLINICAL DATA:  Bilateral hip pain.  History of osseous metastases. EXAM: DG HIP (WITH OR WITHOUT PELVIS) 5+V BILAT COMPARISON:  February 24, 2023. FINDINGS: There is no evidence of hip fracture or dislocation. Moderate degenerative changes are seen involving both hip joints. Status post intramedullary rod fixation of left femur. Mixed lucent and sclerotic lesions seen in left femoral head consistent with metastatic lesion. Sclerosis is seen involving right ischial tuberosity suggesting metastatic disease. 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 No acute fracture or  dislocation. Osseous metastases are again noted. Electronically Signed   By: Lynwood Landy Raddle M.D.   On: 05/09/2023 13:24   MR BRAIN W WO CONTRAST Result Date: 05/09/2023 CLINICAL DATA:  Brain/CNS neoplasm.  Assess treatment response. EXAM: MRI HEAD WITHOUT AND WITH CONTRAST TECHNIQUE: Multiplanar, multiecho pulse sequences of the brain and surrounding structures were obtained without and with intravenous contrast. CONTRAST:  6mL GADAVIST  GADOBUTROL  1 MMOL/ML IV SOLN COMPARISON:  01/03/2023 and multiple previous FINDINGS: Brain: Diffusion imaging does not show any acute or subacute infarction or other cause of restricted diffusion. No abnormality is seen affecting the brainstem or cerebellum. Treated metastatic lesion at the inferior left temporal occipital junction today measures 13 mm front to back, 11 mm right to left, with a thickness of 3.5 mm. This is slightly larger than was seen on the study of 01/03/2023. However, looking at this on multiple prior exams, including the study of 09/10/2022, though the overall diameter is increased by few mm, the cephalo caudal thickness is diminished from 5 mm on the 09/10/2022 study. Therefore, what we are seeing today could be treatment effect, but this area should be observed on subsequent studies. No new or definitely progressive parenchymal metastatic lesion is seen. Mild dural enhancement adjacent to the left frontal bone disease described below appears similar. No hydrocephalus or extra-axial fluid collection. Minimal small vessel change of the white matter is unchanged. Vascular: Major vessels at the base of the brain show flow. Skull and upper cervical spine: Some contraction of the abnormal signal associated with the clivus. No change in the appearance of the left frontal calvarial disease. Sinuses/Orbits: Clear/normal  Other: None IMPRESSION: 1. Treated metastatic lesion at the inferior left temporal occipital junction today measures 13 mm front to back, 11 mm  right to left, with a thickness of 3.5 mm. This is slightly larger in diameter than was seen on the study of 01/03/2023. However, looking at this on multiple prior exams, including the study of 09/10/2022, though the overall axial plane diameter is increased by few mm, the cephalo caudal thickness is diminished from 5 mm on the 09/10/2022 study. Therefore, what we are seeing today could be treatment effect, but this area should be observed on subsequent studies. 2. No new or progressive parenchymal metastatic lesion is seen elsewhere. 3. Some contraction of the abnormal signal associated with the clivus. No change in the appearance of the left frontal calvarial disease. Electronically Signed   By: Oneil Officer M.D.   On: 05/09/2023 12:07

## 2023-05-30 NOTE — Progress Notes (Signed)
 Met with patient and her daughter during follow up visit with Dr. Clista. All needs and questions addressed. Informed will follow up with them regarding recommendations from [facility] orthopedics once Dr. DELENA discusses her case with them. Contact info given and instructed to call with any questions or needs between visits. Informed that will follow up during clinic visits as well. Pt and daughter verbalized understanding. Nothing further needed at this time.

## 2023-05-30 NOTE — Progress Notes (Signed)
 Patient is having some bad nausea, they told me that the pharmacy never received them, and when I look at it in her chart it says waiting on [employer]. Can we give her some zofran  while she is here in clinic. Since November 2024, she has been having pain and stiffness in her right hip and in both shoulders. Rash on the back of her neck she would like for the doctor to look at it.

## 2023-06-02 ENCOUNTER — Encounter: Payer: Self-pay | Admitting: Internal Medicine

## 2023-06-02 ENCOUNTER — Ambulatory Visit: Payer: PPO

## 2023-06-02 ENCOUNTER — Ambulatory Visit
Admission: RE | Admit: 2023-06-02 | Discharge: 2023-06-02 | Disposition: A | Discharge status: Home / Self Care | Payer: PPO | Source: Ambulatory Visit | Attending: Radiation Oncology | Admitting: Radiation Oncology

## 2023-06-02 ENCOUNTER — Inpatient Hospital Stay (HOSPITAL_BASED_OUTPATIENT_CLINIC_OR_DEPARTMENT_OTHER): Payer: PPO | Admitting: Hospice and Palliative Medicine

## 2023-06-02 ENCOUNTER — Other Ambulatory Visit: Payer: Self-pay

## 2023-06-02 ENCOUNTER — Ambulatory Visit
Admission: RE | Admit: 2023-06-02 | Discharge: 2023-06-02 | Disposition: A | Discharge status: Home / Self Care | Payer: PPO | Source: Ambulatory Visit | Attending: Hospice and Palliative Medicine | Admitting: Hospice and Palliative Medicine

## 2023-06-02 ENCOUNTER — Inpatient Hospital Stay: Payer: PPO

## 2023-06-02 ENCOUNTER — Other Ambulatory Visit: Payer: Self-pay | Admitting: *Deleted

## 2023-06-02 ENCOUNTER — Encounter: Payer: Self-pay | Admitting: *Deleted

## 2023-06-02 ENCOUNTER — Encounter: Payer: Self-pay | Admitting: Hospice and Palliative Medicine

## 2023-06-02 VITALS — BP 137/97 | HR 102 | Temp 97.7°F | Resp 17 | Wt 134.0 lb

## 2023-06-02 DIAGNOSIS — G893 Neoplasm related pain (acute) (chronic): Secondary | ICD-10-CM

## 2023-06-02 DIAGNOSIS — E876 Hypokalemia: Secondary | ICD-10-CM

## 2023-06-02 DIAGNOSIS — Z510 Encounter for antineoplastic radiation therapy: Secondary | ICD-10-CM | POA: Diagnosis not present

## 2023-06-02 DIAGNOSIS — C3490 Malignant neoplasm of unspecified part of unspecified bronchus or lung: Secondary | ICD-10-CM

## 2023-06-02 DIAGNOSIS — R109 Unspecified abdominal pain: Secondary | ICD-10-CM | POA: Diagnosis not present

## 2023-06-02 DIAGNOSIS — C7951 Secondary malignant neoplasm of bone: Secondary | ICD-10-CM | POA: Diagnosis not present

## 2023-06-02 DIAGNOSIS — R112 Nausea with vomiting, unspecified: Secondary | ICD-10-CM

## 2023-06-02 DIAGNOSIS — K8020 Calculus of gallbladder without cholecystitis without obstruction: Secondary | ICD-10-CM | POA: Diagnosis not present

## 2023-06-02 DIAGNOSIS — C3412 Malignant neoplasm of upper lobe, left bronchus or lung: Secondary | ICD-10-CM | POA: Diagnosis not present

## 2023-06-02 DIAGNOSIS — K56609 Unspecified intestinal obstruction, unspecified as to partial versus complete obstruction: Secondary | ICD-10-CM | POA: Insufficient documentation

## 2023-06-02 DIAGNOSIS — Z95828 Presence of other vascular implants and grafts: Secondary | ICD-10-CM

## 2023-06-02 LAB — RAD ONC ARIA SESSION SUMMARY
Course Elapsed Days: 4
Plan Fractions Treated to Date: 3
Plan Prescribed Dose Per Fraction: 3 Gy
Plan Total Fractions Prescribed: 10
Plan Total Prescribed Dose: 30 Gy
Reference Point Dosage Given to Date: 9 Gy
Reference Point Session Dosage Given: 3 Gy
Session Number: 3

## 2023-06-02 LAB — CBC WITH DIFFERENTIAL (CANCER CENTER ONLY)
Abs Immature Granulocytes: 0.01 10*3/uL (ref 0.00–0.07)
Basophils Absolute: 0 10*3/uL (ref 0.0–0.1)
Basophils Relative: 0 %
Eosinophils Absolute: 0 10*3/uL (ref 0.0–0.5)
Eosinophils Relative: 1 %
HCT: 28.6 % — ABNORMAL LOW (ref 36.0–46.0)
Hemoglobin: 9.5 g/dL — ABNORMAL LOW (ref 12.0–15.0)
Immature Granulocytes: 0 %
Lymphocytes Relative: 16 %
Lymphs Abs: 0.5 10*3/uL — ABNORMAL LOW (ref 0.7–4.0)
MCH: 33.2 pg (ref 26.0–34.0)
MCHC: 33.2 g/dL (ref 30.0–36.0)
MCV: 100 fL (ref 80.0–100.0)
Monocytes Absolute: 0.3 10*3/uL (ref 0.1–1.0)
Monocytes Relative: 9 %
Neutro Abs: 2.3 10*3/uL (ref 1.7–7.7)
Neutrophils Relative %: 74 %
Platelet Count: 118 10*3/uL — ABNORMAL LOW (ref 150–400)
RBC: 2.86 MIL/uL — ABNORMAL LOW (ref 3.87–5.11)
RDW: 12.1 % (ref 11.5–15.5)
WBC Count: 3.2 10*3/uL — ABNORMAL LOW (ref 4.0–10.5)
nRBC: 0 % (ref 0.0–0.2)

## 2023-06-02 LAB — CMP (CANCER CENTER ONLY)
ALT: 23 U/L (ref 0–44)
AST: 22 U/L (ref 15–41)
Albumin: 3.2 g/dL — ABNORMAL LOW (ref 3.5–5.0)
Alkaline Phosphatase: 181 U/L — ABNORMAL HIGH (ref 38–126)
Anion gap: 8 (ref 5–15)
BUN: 18 mg/dL (ref 8–23)
CO2: 22 mmol/L (ref 22–32)
Calcium: 8.3 mg/dL — ABNORMAL LOW (ref 8.9–10.3)
Chloride: 105 mmol/L (ref 98–111)
Creatinine: 1.04 mg/dL — ABNORMAL HIGH (ref 0.44–1.00)
GFR, Estimated: 57 mL/min — ABNORMAL LOW (ref >60)
Glucose, Bld: 117 mg/dL — ABNORMAL HIGH (ref 70–99)
Potassium: 4.3 mmol/L (ref 3.5–5.1)
Sodium: 135 mmol/L (ref 135–145)
Total Bilirubin: 0.6 mg/dL (ref 0.0–1.2)
Total Protein: 6.8 g/dL (ref 6.5–8.1)

## 2023-06-02 MED ORDER — ONDANSETRON HCL 4 MG/2ML IJ SOLN
8.0000 mg | Freq: Once | INTRAMUSCULAR | Status: AC
  Administered 2023-06-02: 8 mg via INTRAVENOUS
  Filled 2023-06-02: qty 4

## 2023-06-02 MED ORDER — SODIUM CHLORIDE 0.9 % IV SOLN
Freq: Once | INTRAVENOUS | Status: AC
  Filled 2023-06-02: qty 250

## 2023-06-02 MED ORDER — IOHEXOL 300 MG/ML  SOLN
100.0000 mL | Freq: Once | INTRAMUSCULAR | Status: AC | PRN
  Administered 2023-06-02: 100 mL via INTRAVENOUS

## 2023-06-02 MED ORDER — HEPARIN SOD (PORK) LOCK FLUSH 100 UNIT/ML IV SOLN
500.0000 [IU] | Freq: Once | INTRAVENOUS | Status: AC
  Administered 2023-06-02: 500 [IU] via INTRAVENOUS
  Filled 2023-06-02: qty 5

## 2023-06-02 MED ORDER — SODIUM CHLORIDE 0.9% FLUSH
10.0000 mL | Freq: Once | INTRAVENOUS | Status: AC
  Administered 2023-06-02: 10 mL via INTRAVENOUS
  Filled 2023-06-02: qty 10

## 2023-06-02 MED ORDER — HEPARIN SOD (PORK) LOCK FLUSH 100 UNIT/ML IV SOLN
INTRAVENOUS | Status: AC
  Filled 2023-06-02: qty 5

## 2023-06-02 NOTE — Progress Notes (Signed)
 Slight headache this am and constipation no other concerns

## 2023-06-02 NOTE — Progress Notes (Signed)
 Sympt[facility] Management [facility] Telephone:([phone]) [phone] [phone]  Patient Care Team: Valli Gaw, MD as PCP - General (Family Medicine) Drake Gens, RN as Oncology Nurse Navigator Agrawal, Kavita, MD as Consulting Physician (Oncology) Lawerence Pressman, MD as Consulting Physician (Urology)   NAME OF PATIENT: Victoria Foley  [id number]  [birth year]   DATE OF VISIT: 06/02/23  REASON FOR CONSULT: Victoria Foley is a 73 y.o. 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  Patient is status post RT to the right hip due to nondisplaced pathologic fracture.  She is status post palliative RT to brain.  She is on systemic chemotherapy.  INTERVAL HISTORY:  Patient had PET scan on 05/16/2023, which unfortunately showed progressive osseous metastatic disease.  Patient also had MRI left and right hip and left and right shoulder with skeletal metastatic disease in each of those locations.   Patient saw Dr. Aris Bel on 05/30/2023 for evaluation of nausea and vomiting.  Patient managed conservatively with IV fluids and antiemetics.  Abdominal x-ray showed gaseous dilation of the transverse colon could not exclude large bowel obstruction or ileus.  Patient presents [facility] today for follow-up.  Today, patient states that she is feeling slightly better.  Did not have much nausea or vomiting over the weekend.  She has nauseated this morning, however.  Denies abdominal distention.  She is passing gas but has not had a bowel movement in several days.  She is taking MiraLAX and Senokot daily.  Reports stable pain.  Denies fever or chills.  Denies urinary complaints.  No other symptomatic complaints or concerns today.  PAST MEDICAL HISTORY: Past Medical History:  Diagnosis Date   Anemia    Anxiety    Arthritis    Cancer (HCC)    Lung,  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   COVID-19    03/2021   Hypertension    Pneumonia    age 52   Pre-diabetes    Prediabetes    Sciatica     PAST SURGICAL HISTORY:  Past Surgical History:  Procedure Laterality Date   BRONCHIAL BIOPSY  12/17/2022   Procedure: BRONCHIAL BIOPSIES;  Surgeon: Prudy Brownie, DO;  Location: [facility] ENDOSCOPY;  Service: Pulmonary;;   BRONCHIAL NEEDLE ASPIRATION BIOPSY  12/17/2022   Procedure: BRONCHIAL NEEDLE ASPIRATION BIOPSIES;  Surgeon: Prudy Brownie, DO;  Location: [facility] ENDOSCOPY;  Service: Pulmonary;;   CESAREAN SECTION     COLONOSCOPY W/ POLYPECTOMY     ECTOPIC PREGNANCY SURGERY     FEMUR SURGERY     left, s/p rod for fracture   IR IMAGING GUIDED PORT INSERTION  12/11/2021   TUBAL LIGATION      HEMATOLOGY/ONCOLOGY HISTORY:  Oncology History  Primary lung adenocarcinoma (HCC)  10/10/2021 Initial Diagnosis   Primary lung adenocarcinoma (HCC) Stage IV  Patient seen by PCP for wheezing for 2 weeks. Imaging with CXR followed by CT chest showed Spiculated 2.1 x 2.2 cm left upper lobe pulmonary nodule with associated pleural tethering. Innumerable subcentimeter pulmonary nodules throughout the lungs.      11/14/2021 PET scan   IMPRESSION: 1. Left suprahilar nodule with maximum SUV 4.5, and a more distal 2.2 cm left upper lobe nodule with maximum SUV of 5.5. 2. Innumerable scattered small pulmonary nodules, most of which are under 5 mm in diameter,  throughout both lungs. Some are cavitary. Possibilities include cavitating hematogenous disseminated malignancy versus infectious etiology subset as septic emboli. Most of the nodules are stable, some are minimally enlarged compared to 10/10/2021. 3. Abnormal mild permeative type bony findings in the right hemipelvis associated with substantially accentuated metabolic activity. Possible associated pathologic fracture through the  right quadrilateral plate and right acetabulum. The findings involve the right ilium and ischium but also the right lateral sacral ala and a small focus in the left sacrum.    Pathology Results   She had transbronchial biopsies by Dr. Viva Grise on 11/19/21.  Pathology from LUL nodule showed adenocarcinoma, consistent with lung primary.    11/19/2021 Cancer Staging   Staging form: Lung, AJCC 8th Edition - Clinical stage from 11/19/2021: Stage IVB (cT1c, cM1c) - Signed by Loreatha Rodney, MD on 11/27/2021 Stage prefix: Initial diagnosis   11/27/2021 Imaging   MRI pelvis  IMPRESSION: 1. Abnormal marrow signal throughout the right ilium involving the right acetabulum, right superior pubic ramus and right inferior pubic ramus, right side of the sacrum and a smaller focus of abnormal marrow lesion in the left side of the sacrum. Findings are concerning for metastatic disease. 2. Nondisplaced pathologic fracture of the quadrilateral plate of the right acetabulum. 3. Mild muscle edema in the right gluteus minimus and medius muscles likely reflecting mild muscle strain.  MRI brain  IMPRESSION: No evidence of intracranial metastatic disease.   Indeterminate 1 cm left frontal calvarium lesion.   12/18/2021 - 08/27/2022 Chemotherapy   Patient is on Treatment Plan : LUNG Carboplatin  (5) + Pemetrexed  (500) + Pembrolizumab  (200) D1 q21d Induction x 4 cycles / Maintenance Pemetrexed  (500) + Pembrolizumab  (200) D1 q21d     10/07/2022 - 01/21/2023 Chemotherapy   Patient is on Treatment Plan : LUNG Carboplatin  + Paclitaxel  q21d Dose Reduction     03/27/2023 - 04/24/2023 Chemotherapy   Patient is on Treatment Plan : LUNG Bevacizumab  q21d     06/16/2023 -  Chemotherapy   Patient is on Treatment Plan : LUNG Gemcitabine  (1000) D1,8 q21d     Primary malignant neoplasm of left upper lobe of lung (HCC)  06/04/2022 Initial Diagnosis   Primary malignant neoplasm of left upper lobe of lung (HCC)   06/04/2022 Cancer  Staging   Staging form: Lung, AJCC 8th Edition - Pathologic: Stage IVB (pT1c, pNX, cM1c) - Signed by Brahmanday, Govinda R, MD on 06/04/2022     ALLERGIES:  is allergic to diclofenac, lisinopril, and sulfa  antibiotics.  MEDICATIONS:  Current Outpatient Medications  Medication Sig Dispense Refill   albuterol  (VENTOLIN  HFA) 108 (90 Base) MCG/ACT inhaler Inhale 2 puffs into the lungs every 6 (six) hours as needed for wheezing or shortness of breath. 18 g 0   Azelastine  HCl 137 MCG/SPRAY SOLN Place 2 sprays into both nostrils 2 (two) times daily.     betamethasone  dipropionate 0.05 % cream Apply topically 2 (two) times daily for 10 days. 30 g 0   Cholecalciferol (VITAMIN D -3 PO) Take by mouth.     dexamethasone  (DECADRON ) 4 MG tablet Take 1 tablet (4 mg total) by mouth daily. Take for 3 days after chemotherapy 30 tablet 0   fluticasone  (FLONASE ) 50 MCG/ACT nasal spray USE TWO SPRAYS IN EACH NOSTRIL ONCE DAILY prn 16 g 12   gabapentin  (NEURONTIN ) 100 MG capsule Take 1 capsule (100 mg total) by mouth 3 (three) times daily. 90 capsule 0   lactulose  (CHRONULAC ) 10 GM/15ML solution Take 15 mLs (10 g total) by  mouth 3 (three) times daily. 236 mL 0   loratadine  (CLARITIN ) 10 MG tablet TAKE ONE TABLET BY MOUTH DAILY AS NEEDED FOR ALLERGIES 90 tablet 3   losartan  (COZAAR ) 100 MG tablet Take 1 tablet (100 mg total) by mouth daily. 90 tablet 3   magic mouthwash w/lidocaine  SOLN Take 5 mLs by mouth 4 (four) times daily as needed for mouth pain. Sig: Swish/Swallow 5-10 ml four times a day as needed. Dispense 480 ml. 1RF 480 mL 1   MULTIPLE VITAMIN PO Take 1 tablet by mouth daily.     naloxone  (NARCAN ) nasal spray 4 mg/0.1 mL SPRAY 1 SPRAY INTO ONE NOSTRIL AS DIRECTED FOR OPIOID OVERDOSE (TURN PERSON ON SIDE AFTER DOSE. IF NO RESPONSE IN 2-3 MINUTES OR PERSON RESPONDS BUT RELAPSES, REPEAT USING A NEW SPRAY DEVICE AND SPRAY INTO THE OTHER NOSTRIL. CALL 911 AFTER USE.) * EMERGENCY USE ONLY * 1 each 0   OLANZapine   (ZYPREXA ) 5 MG tablet Take 1 tablet (5 mg total) by mouth at bedtime. For appetite 30 tablet 3   ondansetron  (ZOFRAN ) 8 MG tablet Take 1 tablet (8 mg total) by mouth every 8 (eight) hours as needed for nausea or vomiting. 30 tablet 3   oxyCODONE  (OXY IR/ROXICODONE ) 5 MG immediate release tablet Take 1-2 tablets (5-10 mg total) by mouth every 4 (four) hours as needed for severe pain (pain score 7-10). 60 tablet 0   oxyCODONE  ER (XTAMPZA  ER) 9 MG C12A Take 1 capsule by mouth every 12 (twelve) hours. 60 capsule 0   senna (SENOKOT) 8.6 MG TABS tablet Take 1 tablet by mouth daily.     valACYclovir  (VALTREX ) 500 MG tablet Take 1 tablet (500 mg total) by mouth 2 (two) times daily. For shingles prophylaxis 60 tablet 1   No current facility-administered medications for this visit.   Facility-Administered Medications Ordered in Other Visits  Medication Dose Route Frequency Provider Last Rate Last Admin   heparin  lock flush 100 UNIT/ML injection            heparin  lock flush 100 unit/mL  500 Units Intravenous Once Agrawal, Kavita, MD       sodium chloride  flush (NS) 0.9 % injection 10 mL  10 mL Intravenous Once Agrawal, Kavita, MD        VITAL SIGNS: BP (!) 137/97 (Patient Position: Sitting)   Pulse (!) 102   Temp 97.7 F (36.5 C) (Tympanic)   Resp 17   SpO2 100%  There were no vitals filed for this visit.   Estimated body mass index is 27.06 kg/m as calculated from the following:   Height as of 05/28/23: 4\' 11"  (1.499 m).   Weight as of 05/28/23: 134 lb (60.8 kg).  LABS: CBC:    Component Value Date/Time   WBC 10.1 05/30/2023 1039   WBC 4.7 05/26/2023 1400   HGB 10.2 (L) 05/30/2023 1039   HCT 29.8 (L) 05/30/2023 1039   PLT 124 (L) 05/30/2023 1039   MCV 98.7 05/30/2023 1039   NEUTROABS 8.7 (H) 05/30/2023 1039   LYMPHSABS 0.8 05/30/2023 1039   MONOABS 0.5 05/30/2023 1039   EOSABS 0.0 05/30/2023 1039   BASOSABS 0.0 05/30/2023 1039   Comprehensive Metabolic Panel:    Component Value  Date/Time   NA 131 (L) 05/30/2023 1039   K 3.4 (L) 05/30/2023 1039   CL 101 05/30/2023 1039   CO2 21 (L) 05/30/2023 1039   BUN 25 (H) 05/30/2023 1039   CREATININE 1.24 (H) 05/30/2023 1039   GLUCOSE  146 (H) 05/30/2023 1039   CALCIUM 8.6 (L) 05/30/2023 1039   AST 48 (H) 05/30/2023 1039   ALT 48 (H) 05/30/2023 1039   ALKPHOS 172 (H) 05/30/2023 1039   BILITOT 0.8 05/30/2023 1039   PROT 7.3 05/30/2023 1039   ALBUMIN 3.4 (L) 05/30/2023 1039    RADIOGRAPHIC STUDIES: DG Abd 2 Views Result Date: 05/30/2023 CLINICAL DATA:  rule out bowel obstruction EXAM: ABDOMEN - 2 VIEW COMPARISON:  None Available. FINDINGS: Gaseous dilatation of the transverse colon. Increased stool burden within the cecum. There is no evidence of free air. No radio-opaque calculi or other significant radiographic abnormality is seen. IMPRESSION: Gaseous dilatation of the transverse colon. Underlying large bowel obstruction or ileus not excluded. Recommend CT abdomen pelvis with intravenous contrast for further evaluation. Electronically Signed   By: Morgane  Naveau M.D.   On: 05/30/2023 20:24   MR SHOULDER LEFT W WO CONTRAST Result Date: 05/21/2023 CLINICAL DATA:  Metastatic non-small cell lung cancer EXAM: MRI OF THE LEFT SHOULDER WITHOUT AND WITH CONTRAST TECHNIQUE: Multiplanar, multisequence MR imaging of the left shoulder was performed before and after the administration of intravenous contrast. CONTRAST:  6mL GADAVIST  GADOBUTROL  1 MMOL/ML IV SOLN COMPARISON:  PET-CT 05/16/2023 FINDINGS: Rotator cuff: Moderate-severe supraspinatus tendinosis with full-thickness, near-complete tear of the distal tendon with approximately 1.0 cm of tendon retraction. Moderate infraspinatus and mild subscapularis tendinosis without tear. Intact teres minor. Muscles: Preserved bulk and signal intensity of the rotator cuff musculature without edema, atrophy, or fatty infiltration. Biceps long head:  Mild intra-articular biceps tendinosis.  Acromioclavicular Joint: Mild-to-moderate degenerative changes of the AC joint. Moderate-severe subacromial-subdeltoid bursitis. Glenohumeral Joint: No joint effusion. No cartilage defect. Labrum:  No evidence of labral tear. No paralabral cyst. Bones: No acute fracture. No dislocation. Subcentimeter marrow replacing bone lesion within the distal clavicle. Additional bone lesions within the posterolateral left fifth rib and anterior left third rib. Left third rib pathologic fracture, better seen by recent PET-CT. Other: None. IMPRESSION: LEFT SHOULDER: 1. Bone metastases within the distal clavicle, left third rib, and left fifth rib. Left third rib pathologic fracture, better seen by recent PET-CT. 2. Moderate-severe supraspinatus tendinosis with full-thickness, near-complete tear of the distal tendon. 3. Moderate infraspinatus and mild subscapularis tendinosis without tear. 4. Mild intra-articular biceps tendinosis. 5. Moderate-severe subacromial-subdeltoid bursitis. Electronically Signed   By: Leverne Reading D.O.   On: 05/21/2023 08:30   MR SHOULDER RIGHT W WO CONTRAST Result Date: 05/21/2023 CLINICAL DATA:  Metastatic non-small cell lung cancer EXAM: MRI OF THE RIGHT SHOULDER WITHOUT AND WITH CONTRAST TECHNIQUE: Multiplanar, multisequence MR imaging of the right shoulder was performed before and after the administration of intravenous contrast. CONTRAST:  6mL GADAVIST  GADOBUTROL  1 MMOL/ML IV SOLN COMPARISON:  PET-CT 05/15/2018 FINDINGS: Rotator cuff: Severe rotator cuff tendinosis. Full-thickness, near-complete tears of the supraspinatus and infraspinatus tendons with retraction to the level of the humeral head apex. A portion of each tendon does remain intact. Mild subscapularis tendinosis without tear. Intact teres minor. Muscles:  Mild supraspinatus muscle atrophy. Biceps long head: Severe tendinosis and high-grade partial tearing of the intra-articular biceps tendon. Acromioclavicular Joint: Moderate  degenerative changes of the AC joint. Mild-moderate subacromial-subdeltoid bursitis. Glenohumeral Joint: Trace glenohumeral joint effusion. Small intra-articular loose body in the axillary pouch. Mild chondral thinning. Labrum:  Superior labral degeneration. Bones: 1.8 cm marrow replacing bone lesion within the anteromedial aspect of the humeral head with patchy postcontrast enhancement. No pathologic fracture. No additional marrow replacing bone lesions. No fracture or dislocation. Other:  No lymphadenopathy within the included right axilla. IMPRESSION: RIGHT SHOULDER: 1. A 1.8 cm marrow replacing bone lesion within the humeral head, compatible with metastatic disease. No pathologic fracture. 2. Severe rotator cuff tendinosis with full-thickness, near-complete tears of the supraspinatus and infraspinatus tendons. Mild supraspinatus muscle atrophy. 3. Severe tendinosis and high-grade partial tearing of the intra-articular biceps tendon. 4. Moderate AC joint osteoarthritis. Mild-moderate subacromial-subdeltoid bursitis. 5. Mild glenohumeral osteoarthritis. Electronically Signed   By: Leverne Reading D.O.   On: 05/21/2023 08:21   MR HIP RIGHT W WO CONTRAST Result Date: 05/21/2023 CLINICAL DATA:  Metastatic non-small cell lung cancer EXAM: MRI OF THE RIGHT HIP WITHOUT AND WITH CONTRAST MRI OF THE LEFT HIP WITHOUT AND WITH CONTRAST TECHNIQUE: Multiplanar, multisequence MR imaging was performed both before and after administration of intravenous contrast. CONTRAST:  6mL GADAVIST  GADOBUTROL  1 MMOL/ML IV SOLN COMPARISON:  X-ray 05/09/2023, PET-CT 05/16/2023, MRI 11/20/2022 FINDINGS: Bones/Joint/Cartilage Numerous marrow-replacing metastatic bone lesions throughout the pelvis, lower lumbar spine, and proximal left femur. Several of the lesions demonstrate postcontrast enhancement. Overall, there has been slight progression compared to the 11/20/2022 MRI. Postsurgical changes from ORIF the left femur. Chronic pathologic  fracture of the right acetabulum. No evidence of an acute pathologic fracture. No pelvic diastasis. Trace bilateral SI joint effusions. Moderate osteoarthritic changes of both hips. No hip joint effusion. No femoral head avascular necrosis. Ligaments Intact. Muscles and Tendons No acute musculotendinous abnormality. Soft tissues No soft tissue edema or fluid collection. No pelvic or inguinal lymphadenopathy. No extraosseous mass lesion. IMPRESSION: 1. 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 Overall, there has been slight progression compared to the 11/20/2022 MRI. 2. 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 No evidence of an acute pathologic fracture. Electronically Signed   By: Leverne Reading D.O.   On: 05/21/2023 08:15   MR HIP LEFT W WO CONTRAST Result Date: 05/21/2023 CLINICAL DATA:  Metastatic non-small cell lung cancer EXAM: MRI OF THE RIGHT HIP WITHOUT AND WITH CONTRAST MRI OF THE LEFT HIP WITHOUT AND WITH CONTRAST TECHNIQUE: Multiplanar, multisequence MR imaging was performed both before and after administration of intravenous contrast. CONTRAST:  6mL GADAVIST  GADOBUTROL  1 MMOL/ML IV SOLN COMPARISON:  X-ray 05/09/2023, PET-CT 05/16/2023, MRI 11/20/2022 FINDINGS: Bones/Joint/Cartilage Numerous marrow-replacing metastatic bone lesions throughout the pelvis, lower lumbar spine, and proximal left femur. Several of the lesions demonstrate postcontrast enhancement. Overall, there has been slight progression compared to the 11/20/2022 MRI. Postsurgical changes from ORIF the left femur. 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 No evidence of an acute pathologic fracture. No pelvic diastasis. Trace bilateral SI joint effusions. Moderate osteoarthritic changes of both hips. No hip joint effusion. No femoral head avascular necrosis. Ligaments Intact. Muscles and Tendons No acute musculotendinous abnormality. Soft  tissues No soft tissue edema or fluid collection. No pelvic or inguinal lymphadenopathy. No extraosseous mass lesion. IMPRESSION: 1. 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 Overall, there has been slight progression compared to the 11/20/2022 MRI. 2. 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 No evidence of an acute pathologic fracture. Electronically Signed   By: Leverne Reading D.O.   On: 05/21/2023 08:15   NM PET Image Restag (PS) Skull Base To Thigh Result Date: 05/16/2023 CLINICAL DATA:  Subsequent treatment strategy for non-small cell lung cancer. EXAM: NUCLEAR MEDICINE PET SKULL BASE TO THIGH TECHNIQUE: 7.58 mCi F-18 FDG was injected intravenously. Full-ring PET imaging was performed  from the skull base to thigh after the radiotracer. CT data was obtained and used for attenuation correction and anatomic localization. Fasting blood glucose: 52 mg/dl COMPARISON:  Whole-body bone scan 04/10/2023, CT of the chest, abdomen and pelvis 02/24/2023 and PET-CT 11/14/2021 FINDINGS: Mediastinal blood pool activity: SUV max NECK: There is extensive brown fat activity throughout the neck and shoulder regions, limiting sensitivity. Allowing for this, no definite hypermetabolic cervical lymph nodes are identified.Fairly symmetric activity within the lymphoid tissue of Waldeyer's ring is within physiologic limits. No suspicious activity identified within the pharyngeal mucosal space. Incidental CT findings: none CHEST: As in the neck, there is prominent brown fat activity in the paraspinal regions, limiting sensitivity. Allowing for this, no hypermetabolic mediastinal, hilar or axillary lymph nodes are identified. Residual spiculated left upper lobe nodule measures 2.0 x 1.4 cm on image 34/6, unchanged from recent chest CT. This demonstrates intermediate metabolic activity (SUV max 5.9), previously 5.5. Diffuse septal thickening and  perilymphatic nodularity throughout both lungs are grossly stable, without significant hypermetabolic activity. Incidental CT findings: Right IJ Port-A-Cath extends into the lower right atrium. ABDOMEN/PELVIS: There is no hypermetabolic activity within the liver, adrenal glands, spleen or pancreas. There is no hypermetabolic nodal activity in the abdomen or pelvis. Incidental CT findings: Cholelithiasis. Minimal aortic atherosclerosis. Prominent stool throughout the colon. SKELETON: There is multifocal hypermetabolic osseous metastatic disease which has progressed from the previous PET-CT. There is intense hypermetabolic activity within a C7 sclerotic metastasis (SUV max 11.5). Multiple hypermetabolic lesions are present within the lumbar spine, with near complete replacement the L1 vertebral body and its posterior elements (SUV max 11.8). Significant involvement also demonstrated within the L3, L4 and L5 vertebral bodies. There is also significant involvement within the left 3rd rib (SUV max 10.2), both sacral ala and both iliac bones. Two focal areas of hypermetabolic activity also present within proximal left femur status post ORIF, suspicious for residual metastatic disease. Incidental CT findings: none IMPRESSION: 1. Multifocal hypermetabolic osseous metastatic disease, progressed from previous PET-CT. 2. Residual spiculated left upper lobe nodule with intermediate metabolic activity, consistent with residual primary bronchogenic carcinoma. 3. 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 4. No evidence of hypermetabolic nodal or visceral metastatic disease. 5. Extensive brown fat activity throughout the neck and shoulder regions, limiting sensitivity. 6.  Aortic Atherosclerosis (ICD10-I70.0). Electronically Signed   By: Elmon Hagedorn M.D.   On: 05/16/2023 12:31   DG HIPS BILAT WITH PELVIS MIN 5 VIEWS Result Date:  05/09/2023 CLINICAL DATA:  Bilateral hip pain.  History of osseous metastases. EXAM: DG HIP (WITH OR WITHOUT PELVIS) 5+V BILAT COMPARISON:  February 24, 2023. FINDINGS: There is no evidence of hip fracture or dislocation. Moderate degenerative changes are seen involving both hip joints. Status post intramedullary rod fixation of left femur. Mixed lucent and sclerotic lesions seen in left femoral head consistent with metastatic lesion. Sclerosis is seen involving right ischial tuberosity suggesting metastatic disease. 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 No acute fracture or dislocation. Osseous metastases are again noted. Electronically Signed   By: Rosalene Colon M.D.   On: 05/09/2023 13:24   MR BRAIN W WO CONTRAST Result Date: 05/09/2023 CLINICAL DATA:  Brain/CNS neoplasm.  Assess treatment response. EXAM: MRI HEAD WITHOUT AND WITH CONTRAST TECHNIQUE: Multiplanar, multiecho pulse sequences of the brain and surrounding structures were obtained without and with intravenous contrast. CONTRAST:  6mL GADAVIST  GADOBUTROL  1 MMOL/ML IV SOLN COMPARISON:  01/03/2023 and  multiple previous FINDINGS: Brain: Diffusion imaging does not show any acute or subacute infarction or other cause of restricted diffusion. No abnormality is seen affecting the brainstem or cerebellum. Treated metastatic lesion at the inferior left temporal occipital junction today measures 13 mm front to back, 11 mm right to left, with a thickness of 3.5 mm. This is slightly larger than was seen on the study of 01/03/2023. However, looking at this on multiple prior exams, including the study of 09/10/2022, though the overall diameter is increased by few mm, the cephalo caudal thickness is diminished from 5 mm on the 09/10/2022 study. Therefore, what we are seeing today could be treatment effect, but this area should be observed on subsequent studies. No new or definitely progressive parenchymal metastatic lesion is seen.  Mild dural enhancement adjacent to the left frontal bone disease described below appears similar. No hydrocephalus or extra-axial fluid collection. Minimal small vessel change of the white matter is unchanged. Vascular: Major vessels at the base of the brain show flow. Skull and upper cervical spine: Some contraction of the abnormal signal associated with the clivus. No change in the appearance of the left frontal calvarial disease. Sinuses/Orbits: Clear/normal Other: None IMPRESSION: 1. Treated metastatic lesion at the inferior left temporal occipital junction today measures 13 mm front to back, 11 mm right to left, with a thickness of 3.5 mm. This is slightly larger in diameter than was seen on the study of 01/03/2023. However, looking at this on multiple prior exams, including the study of 09/10/2022, though the overall axial plane diameter is increased by few mm, the cephalo caudal thickness is diminished from 5 mm on the 09/10/2022 study. Therefore, what we are seeing today could be treatment effect, but this area should be observed on subsequent studies. 2. No new or progressive parenchymal metastatic lesion is seen elsewhere. 3. Some contraction of the abnormal signal associated with the clivus. No change in the appearance of the left frontal calvarial disease. Electronically Signed   By: Bettylou Brunner M.D.   On: 05/09/2023 12:07    PERFORMANCE STATUS (ECOG) : 1 - Symptomatic but completely ambulatory  Review of Systems Unless otherwise noted, a complete review of systems is negative.  Physical Exam General: NAD Cardiovascular: regular rate and rhythm Pulmonary: clear ant fields Abdomen: soft, nontender, + bowel sounds GU: no suprapubic tenderness Extremities: no edema, no joint deformities Skin: no rashes Neurological: Weakness but otherwise nonfocal  IMPRESSION/PLAN: Stage IV lung cancer -on systemic treatment with gemcitabine .  Nausea/vomiting -overall, symptoms sound improved.  Abdomen  soft without distention noted on exam.  However, abdominal film last week could not rule out colonic obstruction given gaseous distention.  Radiologist recommended obtaining CT scan.  Discussed with patient and Dr. Aris Bel and will proceed with ordering CT today.  Continue antiemetics.  Will proceed with IV fluids today.  Discussed bowel regimen in detail due to probable opioid-induced constipation.  Neoplasm pain -regimen Xtampza /oxycodone .  Patient is pending evaluation later this week for possible steroid injection of the shoulder.  Case and plan discussed with Dr. Aris Bel  RTC next week as previously scheduled   Patient expressed understanding and was in agreement with this plan. She also understands that She can call clinic at any time with any questions, concerns, or complaints.   Thank you for allowing me to participate in the care of this very pleasant patient.   Time Total: 20 minutes  Visit consisted of counseling and education dealing with the complex and emotionally intense  issues of symptom management in the setting of serious illness.Greater than 50%  of this time was spent counseling and coordinating care related to the above assessment and plan.  Signed by: Gerilyn Kobus, PhD, NP-C

## 2023-06-02 NOTE — Progress Notes (Signed)
 Met with patient and her daughter in fluid clinic after her visit with Josh. All questions answered during visit. Pt made aware that Dr. Erman Hayward office at [facility] will be in touch with an appt to schedule cortisone injections for torn rotator cuff. Pt stated that she has an appt with his clinic on Wed 2/19. Pt's daughter questioned if results from HER2 testing is available. Informed that will follow up on results and let them know once available. At this time, HER2 results are still pending. Order faxed by Darrell Else, RN on 2/4. Will follow up with [facility] by the end of the week to get order update. Instructed pt that will follow up at her next visit but advised to call if has any questions or needs between visits. Pt and daughter verbalized understanding.

## 2023-06-02 NOTE — Telephone Encounter (Signed)
 I called [facility] on 2/10 to confirm receipt of order. The stains have been made and the additional orders are being processed. May take a week plus turn around time.

## 2023-06-02 NOTE — Progress Notes (Signed)
 Left patient accessed for CT scan, transported to medical mall. Aware to come back to deaccess if no CT staff able to

## 2023-06-02 NOTE — Patient Instructions (Signed)
 Ondansetron Injection What is this medication? ONDANSETRON (on DAN se tron) prevents nausea and vomiting from chemotherapy, radiation, or surgery. It works by blocking substances in the body that may cause nausea or vomiting. It belongs to a class of medications called antiemetics. This medicine may be used for other purposes; ask your health care provider or pharmacist if you have questions. COMMON BRAND NAME(S): Zofran, Zofran in Dextrose, Zofran Solution What should I tell my care team before I take this medication? They need to know if you have any of these conditions: Heart disease History of irregular heartbeat Liver disease Low levels of magnesium or potassium in the blood An unusual or allergic reaction to ondansetron, granisetron, other medications, foods, dyes, or preservatives Pregnant or trying to get pregnant Breast-feeding How should I use this medication? This medication is injected into a vein. It is given by your care team in a hospital or clinic setting. Talk to your care team about the use of this medication in children. Special care may be needed. Overdosage: If you think you have taken too much of this medicine contact a poison control center or emergency room at once. NOTE: This medicine is only for you. Do not share this medicine with others. What if I miss a dose? This does not apply. What may interact with this medication? Do not take this medication with any of the following: Apomorphine Certain medications for fungal infections, such as fluconazole, itraconazole, ketoconazole, posaconazole, voriconazole Cisapride Dronedarone Pimozide Thioridazine This medication may also interact with the following: Carbamazepine Certain medications for depression, anxiety, or mental health conditions Fentanyl Linezolid MAOIs, such as Carbex, Eldepryl, Marplan, Nardil, and Parnate Methylene blue (injected into a vein) Other medications that cause heart rhythm changes,  such as dofetilide, ziprasidone Phenytoin Rifampicin Tramadol This list may not describe all possible interactions. Give your health care provider a list of all the medicines, herbs, non-prescription drugs, or dietary supplements you use. Also tell them if you smoke, drink alcohol, or use illegal drugs. Some items may interact with your medicine. What should I watch for while using this medication? Your condition will be monitored carefully while you are receiving this medication. What side effects may I notice from receiving this medication? Side effects that you should report to your care team as soon as possible: Allergic reactions--skin rash, itching, hives, swelling of the face, lips, tongue, or throat Bowel blockage--stomach cramping, unable to have a bowel movement or pass gas, loss of appetite, vomiting Chest pain (angina)--pain, pressure, or tightness in the chest, neck, back, or arms Heart rhythm changes--fast or irregular heartbeat, dizziness, feeling faint or lightheaded, chest pain, trouble breathing Irritability, confusion, fast or irregular heartbeat, muscle stiffness, twitching muscles, sweating, high fever, seizure, chills, vomiting, diarrhea, which may be signs of serotonin syndrome Side effects that usually do not require medical attention (report to your care team if they continue or are bothersome): Constipation Diarrhea General discomfort and fatigue Headache This list may not describe all possible side effects. Call your doctor for medical advice about side effects. You may report side effects to FDA at 1-800-FDA-1088. Where should I keep my medication? This medication is given in a hospital or clinic and will not be stored at home. NOTE: This sheet is a summary. It may not cover all possible information. If you have questions about this medicine, talk to your doctor, pharmacist, or health care provider.  2024 Elsevier/Gold Standard (2021-09-06 00:00:00)

## 2023-06-03 ENCOUNTER — Inpatient Hospital Stay: Payer: PPO

## 2023-06-03 ENCOUNTER — Encounter: Payer: Self-pay | Admitting: Internal Medicine

## 2023-06-03 ENCOUNTER — Ambulatory Visit
Admission: RE | Admit: 2023-06-03 | Discharge: 2023-06-03 | Disposition: A | Discharge status: Home / Self Care | Payer: PPO | Source: Ambulatory Visit | Attending: Radiation Oncology | Admitting: Radiation Oncology

## 2023-06-03 ENCOUNTER — Other Ambulatory Visit: Payer: Self-pay

## 2023-06-03 DIAGNOSIS — C3412 Malignant neoplasm of upper lobe, left bronchus or lung: Secondary | ICD-10-CM | POA: Diagnosis not present

## 2023-06-03 DIAGNOSIS — C7951 Secondary malignant neoplasm of bone: Secondary | ICD-10-CM | POA: Diagnosis not present

## 2023-06-03 DIAGNOSIS — Z510 Encounter for antineoplastic radiation therapy: Secondary | ICD-10-CM | POA: Diagnosis not present

## 2023-06-03 LAB — RAD ONC ARIA SESSION SUMMARY
Course Elapsed Days: 5
Plan Fractions Treated to Date: 4
Plan Prescribed Dose Per Fraction: 3 Gy
Plan Total Fractions Prescribed: 10
Plan Total Prescribed Dose: 30 Gy
Reference Point Dosage Given to Date: 12 Gy
Reference Point Session Dosage Given: 3 Gy
Session Number: 4

## 2023-06-03 NOTE — Progress Notes (Signed)
Pharmacist Chemotherapy Monitoring - Initial Assessment    Anticipated start date: 06/16/23   The following has been reviewed per standard work regarding the patient's treatment regimen: The patient's diagnosis, treatment plan and drug doses, and organ/hematologic function Lab orders and baseline tests specific to treatment regimen  The treatment plan start date, drug sequencing, and pre-medications Prior authorization status  Patient's documented medication list, including drug-drug interaction screen and prescriptions for anti-emetics and supportive care specific to the treatment regimen The drug concentrations, fluid compatibility, administration routes, and timing of the medications to be used The patient's access for treatment and lifetime cumulative dose history, if applicable  The patient's medication allergies and previous infusion related reactions, if applicable   Changes made to treatment plan:  N/A  Follow up needed:  N/A   Sharen Hones, RPH, 06/03/2023  8:43 AM

## 2023-06-03 NOTE — Progress Notes (Signed)
Nutrition Follow-up:  Patient with stage IV lung cancer.  Patient on gemcitabine.    Spoke with patient via phone.  Reports that she is constipated and that is effecting her appetite.  Says that bowels were moving ok but are not currently.  Seen by [facility] yesterday and CT obtained.  Says that she is taking miralax and senokot as directed.  Has eaten bacon, egg, toast and coffee this am and apple today.  Has drank water and ensure shakes.      Medications: lactulose, senna, miralax, olanzapine  Labs: glucose 117, creatinine 1.04  Anthropometrics:   Weight 134 lb on 2/10  137 lb on 2051-07-18 136 lb on 10/1 139 lb on 8/9 129 lb on 7/29 141 lb on 4/16   NUTRITION DIAGNOSIS: Unintentional weight loss continues   INTERVENTION:  Continue bowel regimen and if not working to notify MD/NP Encouraged hydration to help with constipation.   Encouraged small frequent mini snacks/meals Encouraged oral nutrition supplements    MONITORING, EVALUATION, GOAL: weight trends, intake   NEXT VISIT: Tuesday, Mar 4 phone call  Obryan Radu B. Freida Busman, RD, LDN Registered Dietitian [phone]

## 2023-06-04 ENCOUNTER — Other Ambulatory Visit: Payer: Self-pay

## 2023-06-04 ENCOUNTER — Ambulatory Visit
Admission: RE | Admit: 2023-06-04 | Discharge: 2023-06-04 | Disposition: A | Discharge status: Home / Self Care | Payer: PPO | Source: Ambulatory Visit | Attending: Radiation Oncology | Admitting: Radiation Oncology

## 2023-06-04 DIAGNOSIS — C3412 Malignant neoplasm of upper lobe, left bronchus or lung: Secondary | ICD-10-CM | POA: Diagnosis not present

## 2023-06-04 DIAGNOSIS — C7951 Secondary malignant neoplasm of bone: Secondary | ICD-10-CM | POA: Diagnosis not present

## 2023-06-04 DIAGNOSIS — Z510 Encounter for antineoplastic radiation therapy: Secondary | ICD-10-CM | POA: Diagnosis not present

## 2023-06-04 LAB — RAD ONC ARIA SESSION SUMMARY
Course Elapsed Days: 6
Plan Fractions Treated to Date: 5
Plan Prescribed Dose Per Fraction: 3 Gy
Plan Total Fractions Prescribed: 10
Plan Total Prescribed Dose: 30 Gy
Reference Point Dosage Given to Date: 15 Gy
Reference Point Session Dosage Given: 3 Gy
Session Number: 5

## 2023-06-05 ENCOUNTER — Ambulatory Visit: Payer: PPO | Admitting: Internal Medicine

## 2023-06-05 ENCOUNTER — Ambulatory Visit
Admission: RE | Admit: 2023-06-05 | Discharge: 2023-06-05 | Disposition: A | Discharge status: Home / Self Care | Payer: PPO | Source: Ambulatory Visit | Attending: Radiation Oncology | Admitting: Radiation Oncology

## 2023-06-05 ENCOUNTER — Other Ambulatory Visit: Payer: Self-pay

## 2023-06-05 ENCOUNTER — Ambulatory Visit: Payer: PPO

## 2023-06-05 ENCOUNTER — Other Ambulatory Visit: Payer: PPO

## 2023-06-05 DIAGNOSIS — C3412 Malignant neoplasm of upper lobe, left bronchus or lung: Secondary | ICD-10-CM | POA: Diagnosis not present

## 2023-06-05 DIAGNOSIS — C7951 Secondary malignant neoplasm of bone: Secondary | ICD-10-CM | POA: Diagnosis not present

## 2023-06-05 DIAGNOSIS — Z510 Encounter for antineoplastic radiation therapy: Secondary | ICD-10-CM | POA: Diagnosis not present

## 2023-06-05 LAB — RAD ONC ARIA SESSION SUMMARY
Course Elapsed Days: 7
Plan Fractions Treated to Date: 6
Plan Prescribed Dose Per Fraction: 3 Gy
Plan Total Fractions Prescribed: 10
Plan Total Prescribed Dose: 30 Gy
Reference Point Dosage Given to Date: 18 Gy
Reference Point Session Dosage Given: 3 Gy
Session Number: 6

## 2023-06-06 ENCOUNTER — Ambulatory Visit
Admission: RE | Admit: 2023-06-06 | Discharge: 2023-06-06 | Disposition: A | Discharge status: Home / Self Care | Payer: PPO | Source: Ambulatory Visit | Attending: Radiation Oncology | Admitting: Radiation Oncology

## 2023-06-06 ENCOUNTER — Other Ambulatory Visit: Payer: Self-pay

## 2023-06-06 DIAGNOSIS — C7951 Secondary malignant neoplasm of bone: Secondary | ICD-10-CM | POA: Diagnosis not present

## 2023-06-06 DIAGNOSIS — Z510 Encounter for antineoplastic radiation therapy: Secondary | ICD-10-CM | POA: Diagnosis not present

## 2023-06-06 DIAGNOSIS — C3412 Malignant neoplasm of upper lobe, left bronchus or lung: Secondary | ICD-10-CM | POA: Diagnosis not present

## 2023-06-06 LAB — RAD ONC ARIA SESSION SUMMARY
Course Elapsed Days: 8
Plan Fractions Treated to Date: 7
Plan Prescribed Dose Per Fraction: 3 Gy
Plan Total Fractions Prescribed: 10
Plan Total Prescribed Dose: 30 Gy
Reference Point Dosage Given to Date: 21 Gy
Reference Point Session Dosage Given: 3 Gy
Session Number: 7

## 2023-06-09 ENCOUNTER — Ambulatory Visit: Payer: PPO

## 2023-06-09 ENCOUNTER — Other Ambulatory Visit: Payer: PPO

## 2023-06-09 ENCOUNTER — Other Ambulatory Visit: Payer: Self-pay

## 2023-06-09 ENCOUNTER — Encounter: Payer: Self-pay | Admitting: *Deleted

## 2023-06-09 ENCOUNTER — Ambulatory Visit
Admission: RE | Admit: 2023-06-09 | Discharge: 2023-06-09 | Disposition: A | Discharge status: Home / Self Care | Payer: PPO | Source: Ambulatory Visit | Attending: Radiation Oncology | Admitting: Radiation Oncology

## 2023-06-09 DIAGNOSIS — C7951 Secondary malignant neoplasm of bone: Secondary | ICD-10-CM | POA: Diagnosis not present

## 2023-06-09 DIAGNOSIS — C3412 Malignant neoplasm of upper lobe, left bronchus or lung: Secondary | ICD-10-CM | POA: Diagnosis not present

## 2023-06-09 DIAGNOSIS — Z510 Encounter for antineoplastic radiation therapy: Secondary | ICD-10-CM | POA: Diagnosis not present

## 2023-06-09 LAB — RAD ONC ARIA SESSION SUMMARY
Course Elapsed Days: 11
Plan Fractions Treated to Date: 8
Plan Prescribed Dose Per Fraction: 3 Gy
Plan Total Fractions Prescribed: 10
Plan Total Prescribed Dose: 30 Gy
Reference Point Dosage Given to Date: 24 Gy
Reference Point Session Dosage Given: 3 Gy
Session Number: 8

## 2023-06-09 NOTE — Progress Notes (Signed)
Spoke to [employer] [facility] [employer] to follow up on add on testing for HER2 and CMET. Results still pending at this time and results can be expected later this week or early next week.

## 2023-06-10 ENCOUNTER — Other Ambulatory Visit: Payer: Self-pay

## 2023-06-10 ENCOUNTER — Ambulatory Visit
Admission: RE | Admit: 2023-06-10 | Discharge: 2023-06-10 | Disposition: A | Discharge status: Home / Self Care | Payer: PPO | Source: Ambulatory Visit | Attending: Radiation Oncology | Admitting: Radiation Oncology

## 2023-06-10 ENCOUNTER — Inpatient Hospital Stay: Payer: PPO

## 2023-06-10 VITALS — BP 130/80 | HR 81 | Temp 98.5°F

## 2023-06-10 DIAGNOSIS — Z95828 Presence of other vascular implants and grafts: Secondary | ICD-10-CM

## 2023-06-10 DIAGNOSIS — C3412 Malignant neoplasm of upper lobe, left bronchus or lung: Secondary | ICD-10-CM | POA: Diagnosis not present

## 2023-06-10 DIAGNOSIS — C7951 Secondary malignant neoplasm of bone: Secondary | ICD-10-CM | POA: Diagnosis not present

## 2023-06-10 DIAGNOSIS — C3490 Malignant neoplasm of unspecified part of unspecified bronchus or lung: Secondary | ICD-10-CM

## 2023-06-10 DIAGNOSIS — E860 Dehydration: Secondary | ICD-10-CM

## 2023-06-10 DIAGNOSIS — Z510 Encounter for antineoplastic radiation therapy: Secondary | ICD-10-CM | POA: Diagnosis not present

## 2023-06-10 LAB — RAD ONC ARIA SESSION SUMMARY
Course Elapsed Days: 12
Plan Fractions Treated to Date: 9
Plan Prescribed Dose Per Fraction: 3 Gy
Plan Total Fractions Prescribed: 10
Plan Total Prescribed Dose: 30 Gy
Reference Point Dosage Given to Date: 27 Gy
Reference Point Session Dosage Given: 3 Gy
Session Number: 9

## 2023-06-10 LAB — CBC WITH DIFFERENTIAL (CANCER CENTER ONLY)
Abs Immature Granulocytes: 0.01 10*3/uL (ref 0.00–0.07)
Basophils Absolute: 0 10*3/uL (ref 0.0–0.1)
Basophils Relative: 0 %
Eosinophils Absolute: 0.1 10*3/uL (ref 0.0–0.5)
Eosinophils Relative: 3 %
HCT: 30.7 % — ABNORMAL LOW (ref 36.0–46.0)
Hemoglobin: 10.2 g/dL — ABNORMAL LOW (ref 12.0–15.0)
Immature Granulocytes: 0 %
Lymphocytes Relative: 20 %
Lymphs Abs: 0.5 10*3/uL — ABNORMAL LOW (ref 0.7–4.0)
MCH: 33.7 pg (ref 26.0–34.0)
MCHC: 33.2 g/dL (ref 30.0–36.0)
MCV: 101.3 fL — ABNORMAL HIGH (ref 80.0–100.0)
Monocytes Absolute: 0.2 10*3/uL (ref 0.1–1.0)
Monocytes Relative: 8 %
Neutro Abs: 1.8 10*3/uL (ref 1.7–7.7)
Neutrophils Relative %: 69 %
Platelet Count: 116 10*3/uL — ABNORMAL LOW (ref 150–400)
RBC: 3.03 MIL/uL — ABNORMAL LOW (ref 3.87–5.11)
RDW: 12.6 % (ref 11.5–15.5)
WBC Count: 2.6 10*3/uL — ABNORMAL LOW (ref 4.0–10.5)
nRBC: 0 % (ref 0.0–0.2)

## 2023-06-10 LAB — CMP (CANCER CENTER ONLY)
ALT: 15 U/L (ref 0–44)
AST: 20 U/L (ref 15–41)
Albumin: 3.3 g/dL — ABNORMAL LOW (ref 3.5–5.0)
Alkaline Phosphatase: 145 U/L — ABNORMAL HIGH (ref 38–126)
Anion gap: 9 (ref 5–15)
BUN: 19 mg/dL (ref 8–23)
CO2: 20 mmol/L — ABNORMAL LOW (ref 22–32)
Calcium: 8.4 mg/dL — ABNORMAL LOW (ref 8.9–10.3)
Chloride: 106 mmol/L (ref 98–111)
Creatinine: 1.11 mg/dL — ABNORMAL HIGH (ref 0.44–1.00)
GFR, Estimated: 53 mL/min — ABNORMAL LOW (ref >60)
Glucose, Bld: 110 mg/dL — ABNORMAL HIGH (ref 70–99)
Potassium: 4.5 mmol/L (ref 3.5–5.1)
Sodium: 135 mmol/L (ref 135–145)
Total Bilirubin: 0.6 mg/dL (ref 0.0–1.2)
Total Protein: 7.2 g/dL (ref 6.5–8.1)

## 2023-06-10 MED ORDER — SODIUM CHLORIDE 0.9 % IV SOLN
INTRAVENOUS | Status: DC
  Filled 2023-06-10 (×2): qty 250

## 2023-06-10 MED ORDER — HEPARIN SOD (PORK) LOCK FLUSH 100 UNIT/ML IV SOLN
500.0000 [IU] | Freq: Once | INTRAVENOUS | Status: AC
  Administered 2023-06-10: 500 [IU] via INTRAVENOUS
  Filled 2023-06-10: qty 5

## 2023-06-10 MED ORDER — SODIUM CHLORIDE 0.9% FLUSH
10.0000 mL | INTRAVENOUS | Status: DC | PRN
Start: 2023-06-10 — End: 2023-06-10
  Administered 2023-06-10: 10 mL via INTRAVENOUS
  Filled 2023-06-10: qty 10

## 2023-06-11 ENCOUNTER — Ambulatory Visit: Payer: PPO

## 2023-06-11 ENCOUNTER — Ambulatory Visit
Admission: RE | Admit: 2023-06-11 | Discharge: 2023-06-11 | Disposition: A | Discharge status: Home / Self Care | Payer: PPO | Source: Ambulatory Visit | Attending: Radiation Oncology | Admitting: Radiation Oncology

## 2023-06-11 ENCOUNTER — Encounter: Payer: Self-pay | Admitting: Internal Medicine

## 2023-06-11 ENCOUNTER — Other Ambulatory Visit: Payer: Self-pay

## 2023-06-11 DIAGNOSIS — C7951 Secondary malignant neoplasm of bone: Secondary | ICD-10-CM | POA: Diagnosis not present

## 2023-06-11 DIAGNOSIS — Z510 Encounter for antineoplastic radiation therapy: Secondary | ICD-10-CM | POA: Diagnosis not present

## 2023-06-11 DIAGNOSIS — C3412 Malignant neoplasm of upper lobe, left bronchus or lung: Secondary | ICD-10-CM | POA: Diagnosis not present

## 2023-06-11 LAB — RAD ONC ARIA SESSION SUMMARY
Course Elapsed Days: 13
Plan Fractions Treated to Date: 10
Plan Prescribed Dose Per Fraction: 3 Gy
Plan Total Fractions Prescribed: 10
Plan Total Prescribed Dose: 30 Gy
Reference Point Dosage Given to Date: 30 Gy
Reference Point Session Dosage Given: 3 Gy
Session Number: 10

## 2023-06-12 ENCOUNTER — Ambulatory Visit: Payer: PPO

## 2023-06-12 NOTE — Radiation Completion Notes (Signed)
Patient Name: Victoria Foley, TREGONING MRN: [id number] Date of Birth: August 06, 1950 Referring Physician: Dana Allan, M.D. Date of Service: 2023-06-12 Radiation Oncologist: Carmina Miller, M.D. 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  We previously treated her bilateral pelvis for metastatic disease as well as left frontal region of her calvarium and brain for metastatic involvement..  From a brain standpoint she is asymptomatic she is having increased her lower back pain and bilateral hip pain.  Recent PET scan shows multifocal hypermetabolic osseous metastatic disease progressing.  She also has residual spiculated left upper lobe nodule with intermediate metabolic activity consistent with residual primary bronchogenic carcinoma.  There is significant activity in L1-L5 with near complete replacement of the L1 vertebral body and posterior elements.  Recent MRI scans of her left shoulder shows bone metastasis within the distal clavicle and left fifth rib.  MRI scan of her right shoulder shows 1.8 cm marrow replacing bone lesion within the humeral head with no pathologic fracture.  MRI scans of her pelvis showed numerous marrow replacing metastatic bone lesions throughout the pelvis lower lumbar spine and proximal left femur.  In regards to her previously treated left temporal occipital junction lesion there is been slight progression from 11 mm to 13 mm which is slightly larger than September 2024.  According to reader there were the overall axial plane diameter is increased by only a few millimeters  in the cephalocaudal thickness and diminished from 5 mm from May 24 study overseeing today could be treatment effect.  There were no new or progressive parenchymal metastatic lesions.  Patient complains mostly of lower back pain and bilateral hip pain.      ==========DELIVERED PLANS==========  First Treatment Date: 2023-05-29 Last Treatment Date: 2023-06-11   Plan Name: Spine_L Site: Lumbar Spine Technique: 3D Mode: Photon Dose Per Fraction: 3 Gy Prescribed Dose (Delivered / Prescribed): 30 Gy / 30 Gy Prescribed Fxs (Delivered / Prescribed): 10 / 10     ==========ON TREATMENT VISIT DATES========== 2023-06-03, 2023-06-10     ==========UPCOMING VISITS========== 08/15/2023 [facility] ON[facility] EST PT 30 Henreitta Leber, MD  08/08/2023 [facility] MR BRAIN W WO CONTRAST [facility] 1  07/17/2023 [facility] ONCOLOGY FOLLOW UP 30 Carmina Miller, MD  06/24/2023 [facility] NUT 45 Alphonse Guild, RD  06/23/2023 [facility] ON[facility] INFUSION 1HR30MIN (90) [facility] INFUSION CHAIR 3  06/23/2023 [facility] EST PT 15 Michaelyn Barter, MD  06/23/2023 [facility] ONC PORT FLUSH W/LAB [facility]PORT FLUSH  06/16/2023 [facility] ON[facility] INFUSION 1HR30MIN (90) [facility] INFUSION CHAIR 7  06/16/2023 [facility] ON[facility] EST PT 15 Michaelyn Barter, MD  06/16/2023 [facility] ON[facility] PORT FLUSH W/LAB [facility]PORT FLUSH        ==========APPENDIX - ON TREATMENT VISIT NOTES==========   See weekly On Treatment Notes in Epic for details in the Media tab (listed as Progress notes on the On Treatment Visit Dates listed above).

## 2023-06-13 ENCOUNTER — Ambulatory Visit: Payer: PPO

## 2023-06-16 ENCOUNTER — Inpatient Hospital Stay: Payer: PPO

## 2023-06-16 ENCOUNTER — Inpatient Hospital Stay (HOSPITAL_BASED_OUTPATIENT_CLINIC_OR_DEPARTMENT_OTHER): Payer: PPO | Admitting: Internal Medicine

## 2023-06-16 ENCOUNTER — Encounter: Payer: Self-pay | Admitting: Internal Medicine

## 2023-06-16 ENCOUNTER — Telehealth: Payer: Self-pay | Admitting: *Deleted

## 2023-06-16 VITALS — BP 116/69 | HR 107 | Temp 98.7°F | Resp 16 | Wt 122.2 lb

## 2023-06-16 DIAGNOSIS — C3490 Malignant neoplasm of unspecified part of unspecified bronchus or lung: Secondary | ICD-10-CM

## 2023-06-16 DIAGNOSIS — T451X5A Adverse effect of antineoplastic and immunosuppressive drugs, initial encounter: Secondary | ICD-10-CM | POA: Diagnosis not present

## 2023-06-16 DIAGNOSIS — G893 Neoplasm related pain (acute) (chronic): Secondary | ICD-10-CM | POA: Diagnosis not present

## 2023-06-16 DIAGNOSIS — D701 Agranulocytosis secondary to cancer chemotherapy: Secondary | ICD-10-CM | POA: Diagnosis not present

## 2023-06-16 DIAGNOSIS — Z95828 Presence of other vascular implants and grafts: Secondary | ICD-10-CM

## 2023-06-16 DIAGNOSIS — Z510 Encounter for antineoplastic radiation therapy: Secondary | ICD-10-CM | POA: Diagnosis not present

## 2023-06-16 LAB — CMP (CANCER CENTER ONLY)
ALT: 14 U/L (ref 0–44)
AST: 20 U/L (ref 15–41)
Albumin: 3.4 g/dL — ABNORMAL LOW (ref 3.5–5.0)
Alkaline Phosphatase: 133 U/L — ABNORMAL HIGH (ref 38–126)
Anion gap: 8 (ref 5–15)
BUN: 15 mg/dL (ref 8–23)
CO2: 19 mmol/L — ABNORMAL LOW (ref 22–32)
Calcium: 7.9 mg/dL — ABNORMAL LOW (ref 8.9–10.3)
Chloride: 109 mmol/L (ref 98–111)
Creatinine: 1.04 mg/dL — ABNORMAL HIGH (ref 0.44–1.00)
GFR, Estimated: 57 mL/min — ABNORMAL LOW (ref >60)
Glucose, Bld: 111 mg/dL — ABNORMAL HIGH (ref 70–99)
Potassium: 3.9 mmol/L (ref 3.5–5.1)
Sodium: 136 mmol/L (ref 135–145)
Total Bilirubin: 0.5 mg/dL (ref 0.0–1.2)
Total Protein: 7.1 g/dL (ref 6.5–8.1)

## 2023-06-16 LAB — CBC WITH DIFFERENTIAL (CANCER CENTER ONLY)
Abs Immature Granulocytes: 0.01 10*3/uL (ref 0.00–0.07)
Basophils Absolute: 0 10*3/uL (ref 0.0–0.1)
Basophils Relative: 1 %
Eosinophils Absolute: 0.1 10*3/uL (ref 0.0–0.5)
Eosinophils Relative: 2 %
HCT: 31.2 % — ABNORMAL LOW (ref 36.0–46.0)
Hemoglobin: 10.2 g/dL — ABNORMAL LOW (ref 12.0–15.0)
Immature Granulocytes: 1 %
Lymphocytes Relative: 26 %
Lymphs Abs: 0.5 10*3/uL — ABNORMAL LOW (ref 0.7–4.0)
MCH: 32.5 pg (ref 26.0–34.0)
MCHC: 32.7 g/dL (ref 30.0–36.0)
MCV: 99.4 fL (ref 80.0–100.0)
Monocytes Absolute: 0.3 10*3/uL (ref 0.1–1.0)
Monocytes Relative: 14 %
Neutro Abs: 1.2 10*3/uL — ABNORMAL LOW (ref 1.7–7.7)
Neutrophils Relative %: 56 %
Platelet Count: 106 10*3/uL — ABNORMAL LOW (ref 150–400)
RBC: 3.14 MIL/uL — ABNORMAL LOW (ref 3.87–5.11)
RDW: 13 % (ref 11.5–15.5)
WBC Count: 2.1 10*3/uL — ABNORMAL LOW (ref 4.0–10.5)
nRBC: 0 % (ref 0.0–0.2)

## 2023-06-16 MED ORDER — CYCLOBENZAPRINE HCL 5 MG PO TABS: 5.0000 mg | ORAL_TABLET | Freq: Two times a day (BID) | ORAL | 0 refills | Status: DC | PRN

## 2023-06-16 MED ORDER — HEPARIN SOD (PORK) LOCK FLUSH 100 UNIT/ML IV SOLN
500.0000 [IU] | Freq: Once | INTRAVENOUS | Status: AC
  Administered 2023-06-16: 500 [IU]
  Filled 2023-06-16: qty 5

## 2023-06-16 NOTE — Addendum Note (Signed)
 Addended byMichaelyn Barter on: 06/16/2023 12:36 PM   Modules accepted: Orders

## 2023-06-16 NOTE — Progress Notes (Signed)
 [facility] OFFICE PROGRESS NOTE  Patient Care Team: Dana Allan, MD as PCP - General (Family Medicine) Glory Buff, RN as Oncology Nurse Navigator Michaelyn Barter, MD as Consulting Physician (Oncology) Sondra Come, MD as Consulting Physician (Urology)  TREATMENT:  Right hip palliative RT 30 cGy completed 12/18/2021 Carboplatin, alimta and Keytruda x 4 cycles completed 02/19/22.  Discontinued maintenance alimta (04-23-22) due to worsening renal dysfunction, transaminitis and cytopenias Keytruda maintenance-discontinued on 09/17/2022 due to disease progression Palliative RT to left frontal lesion 5 fx completed 09/27/22 Palliative RT to left hip completed September 2024 Carboplatin (AUC 4) and paclitaxel 150 mg/m2 - x 5 cycles ( 10/07/2022- 01/21/2023). Maintenance Avastin 15 mg/kg- 03/27/2023- 04/24/23  ASSESSMENT & PLAN:   # Primary lung adenocarcinoma (HCC), Stage IV, PDL1 1% -Progressed through first-line [place] Alimta, Keytruda and then [place] maintenance on 09/17/2022.  -s/p second line carbo AUC 4 and Taxol 150 mg/m on 10/07/2022.  Completed 5 cycles and then discontinued due to severe cytopenias.  Patient agreed for maintenance Avastin (was not added with chemo because patient was hesitant).  Progressed through maintenance Avastin.  Last dose 04/24/2023.  -Foundation 1 from August 2023 no targets.  Liquid foundation 1 CDX from June 2024 with no targetable mutation.  Repeat lung biopsy on 12/17/2022 sent for foundation 1 CDX testing was limited due to inadequate sample but no targets seen.  RNA fusion panel was sent on primary biopsy from August 2023.  It could not be run due to low tumor purity.  -PET from 05/16/2023 showed disease progression in the bones.  Status post palliative radiation to spine.  She also had televisit with Dr. Kemper Durie at [facility] who recommended single agent gemcitabine.  -Labs reviewed today.  WBC is 2.2 with ANC of 1.2.  Likely myelosuppression from  radiation treatments.  She has been feeling increasingly weak.  No appetite.  Weight loss about 12 pounds in past few weeks.  Hold treatment today.  HER2 IHC 0 (negative).  No role for HER2 directed agents. Follow-up in about 2 weeks for consideration of treatment.  With her decline in the functional status, I also had a discussion about comfort measures/symptom management only.  They will think about it.  # Constipation -Continue with Senokot, MiraLAX as needed.  # Cancer-related pain -Follows with palliative care. 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   -Follow-up with Dr. Barbaraann Cao.  Dr. Barbaraann Cao discussed the case with Dr. Rushie Chestnut who plans to hold off on radiation at this time.  Since the growth is minimal and patient is asymptomatic. -Follow-up MRI brain scheduled in April 2025.  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 Cr stabilized.  -will continue to monitor.  # Chemotherapy-induced anemia  -Aranesp 300 mcg as needed.   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 Aggie Cosier on 12/18/2021 -Completed palliative RT to left hip on 01/01/2023.   -Was seen by [facility].  No surgical intervention needed. -Plan for palliative RT to lumbar spine for pain control  # Metastatic bone lesions -Xgeva 120 mg subcu monthly for now.  # Poor appetite # Weight loss -Continue follow-up with nutrition. -Advised to continue with olanzapine for stimulant.  # Access - port  RTC  in 2 weeks for MD visit, labs, cycle 1 gemcitabine  Orders Placed This Encounter  Procedures   CBC with Differential (Cancer Center Only)    Standing Status:   Future    Expected Date:   06/26/2023    Expiration Date:    06/25/2024   CMP (Cancer Center only)    Standing Status:   Future    Expected Date:   06/26/2023    Expiration Date:   06/25/2024    [facility]- 12/06/2021   All questions were answered. The patient knows to call the clinic with any problems, questions or concerns. The total time spent in the appointment was 30 minutes encounter with patients including review of chart and various tests results, discussions about plan of care and coordination of care plan   Michaelyn Barter, MD 06/16/2023 2:14 PM  INTERVAL HISTORY: Patient seen today as follow-up accompanied with daughter for management of stage IV lung adenocarcinoma. She completed radiation last week.  Has been feeling weak and tired.  Diet.  Has lost significant weight in the past weeks.  She is using long acting oxycodone.  Not using any short acting in between.  Does report muscle stiffness.  REVIEW OF SYSTEMS:   Positive ROS as above.  Rest 10 points ROS negative   I have reviewed the past medical history, past surgical history, social history and family history with the patient and they are unchanged from previous note.  ALLERGIES:  is allergic to diclofenac, lisinopril, and sulfa antibiotics.  MEDICATIONS:  Current Outpatient Medications  Medication Sig Dispense Refill   cyclobenzaprine (FLEXERIL) 5 MG tablet Take 1 tablet (5 mg total) by mouth 2 (two) times daily as needed for muscle spasms. 30 tablet 0   albuterol (VENTOLIN HFA) 108 (90 Base) MCG/ACT inhaler Inhale 2 puffs into the lungs every 6 (six) hours as needed for wheezing or shortness of breath. 18 g 0   Azelastine HCl 137 MCG/SPRAY SOLN Place 2 sprays into both nostrils 2 (two) times daily.     Cholecalciferol (VITAMIN D-3 PO) Take by mouth.     dexamethasone (DECADRON) 4 MG tablet Take 1 tablet (4 mg total) by mouth daily. Take for 3 days after chemotherapy 30 tablet 0   fluticasone (FLONASE) 50 MCG/ACT nasal spray USE TWO SPRAYS IN EACH NOSTRIL ONCE DAILY prn 16 g  12   gabapentin (NEURONTIN) 100 MG capsule Take 1 capsule (100 mg total) by mouth 3 (three) times daily. 90 capsule 0   lactulose (CHRONULAC) 10 GM/15ML solution Take 15 mLs (10 g total) by mouth 3 (three) times daily. 236 mL 0   loratadine (CLARITIN) 10 MG tablet TAKE ONE TABLET BY MOUTH DAILY AS NEEDED FOR ALLERGIES 90 tablet 3   losartan (COZAAR) 100 MG tablet Take 1 tablet (100 mg total) by mouth daily. 90 tablet 3   magic mouthwash w/lidocaine SOLN Take 5 mLs by mouth 4 (four) times daily as needed for mouth pain. Sig: Swish/Swallow 5-10 ml four times a day as needed. Dispense 480 ml. 1RF 480 mL 1   MULTIPLE VITAMIN PO Take 1 tablet by mouth daily.     naloxone (NARCAN) nasal spray 4 mg/0.1 mL SPRAY 1 SPRAY INTO ONE NOSTRIL AS DIRECTED FOR OPIOID OVERDOSE (TURN PERSON ON SIDE AFTER DOSE. IF NO RESPONSE IN 2-3 MINUTES OR PERSON RESPONDS BUT RELAPSES, REPEAT USING A NEW SPRAY DEVICE AND SPRAY INTO THE OTHER NOSTRIL. CALL 911 AFTER USE.) * EMERGENCY USE ONLY * 1 each 0  OLANZapine (ZYPREXA) 5 MG tablet Take 1 tablet (5 mg total) by mouth at bedtime. For appetite 30 tablet 3   ondansetron (ZOFRAN) 8 MG tablet Take 1 tablet (8 mg total) by mouth every 8 (eight) hours as needed for nausea or vomiting. 30 tablet 3   oxyCODONE (OXY IR/ROXICODONE) 5 MG immediate release tablet Take 1-2 tablets (5-10 mg total) by mouth every 4 (four) hours as needed for severe pain (pain score 7-10). 60 tablet 0   oxyCODONE ER (XTAMPZA ER) 9 MG C12A Take 1 capsule by mouth every 12 (twelve) hours. 60 capsule 0   senna (SENOKOT) 8.6 MG TABS tablet Take 1 tablet by mouth daily.     valACYclovir (VALTREX) 500 MG tablet Take 1 tablet (500 mg total) by mouth 2 (two) times daily. For shingles prophylaxis 60 tablet 1   No current facility-administered medications for this visit.   Facility-Administered Medications Ordered in Other Visits  Medication Dose Route Frequency Provider Last Rate Last Admin   heparin lock flush 100  UNIT/ML injection            heparin lock flush 100 unit/mL  500 Units Intravenous Once Michaelyn Barter, MD       sodium chloride flush (NS) 0.9 % injection 10 mL  10 mL Intravenous Once Michaelyn Barter, MD        SUMMARY OF ONCOLOGIC HISTORY: Oncology History  Primary lung adenocarcinoma (HCC)  10/10/2021 Initial Diagnosis   Primary lung adenocarcinoma (HCC) Stage IV  Patient seen by PCP for wheezing for 2 weeks. Imaging with CXR followed by CT chest showed Spiculated 2.1 x 2.2 cm left upper lobe pulmonary nodule with associated pleural tethering. Innumerable subcentimeter pulmonary nodules throughout the lungs.      11/14/2021 PET scan   IMPRESSION: 1. Left suprahilar nodule with maximum SUV 4.5, and a more distal 2.2 cm left upper lobe nodule with maximum SUV of 5.5. 2. Innumerable scattered small pulmonary nodules, most of which are under 5 mm in diameter, throughout both lungs. Some are cavitary. Possibilities include cavitating hematogenous disseminated malignancy versus infectious etiology subset as septic emboli. Most of the nodules are stable, some are minimally enlarged compared to 10/10/2021. 3. Abnormal mild permeative type bony findings in the right hemipelvis associated with substantially accentuated metabolic activity. Possible associated pathologic fracture through the right quadrilateral plate and right acetabulum. The findings involve the right ilium and ischium but also the right lateral sacral ala and a small focus in the left sacrum.    Pathology Results   She had transbronchial biopsies by Dr. Jayme Cloud on 11/19/21.  Pathology from LUL nodule showed adenocarcinoma, consistent with lung primary.    11/19/2021 Cancer Staging   Staging form: Lung, AJCC 8th Edition - Clinical stage from 11/19/2021: Stage IVB (cT1c, cM1c) - Signed by Michaelyn Barter, MD on 11/27/2021 Stage prefix: Initial diagnosis   11/27/2021 Imaging   MRI pelvis  IMPRESSION: 1. Abnormal marrow  signal throughout the right ilium involving the right acetabulum, right superior pubic ramus and right inferior pubic ramus, right side of the sacrum and a smaller focus of abnormal marrow lesion in the left side of the sacrum. Findings are concerning for metastatic disease. 2. Nondisplaced pathologic fracture of the quadrilateral plate of the right acetabulum. 3. Mild muscle edema in the right gluteus minimus and medius muscles likely reflecting mild muscle strain.  MRI brain  IMPRESSION: No evidence of intracranial metastatic disease.   Indeterminate 1 cm left frontal calvarium lesion.   12/18/2021 -  08/27/2022 Chemotherapy   Patient is on Treatment Plan : LUNG Carboplatin (5) + Pemetrexed (500) + Pembrolizumab (200) D1 q21d Induction x 4 cycles / Maintenance Pemetrexed (500) + Pembrolizumab (200) D1 q21d     10/07/2022 - 01/21/2023 Chemotherapy   Patient is on Treatment Plan : LUNG Carboplatin + Paclitaxel q21d Dose Reduction     03/27/2023 - 04/24/2023 Chemotherapy   Patient is on Treatment Plan : LUNG Bevacizumab q21d     06/26/2023 -  Chemotherapy   Patient is on Treatment Plan : LUNG Gemcitabine (1000) D1,8 q21d     Primary malignant neoplasm of left upper lobe of lung (HCC)  06/04/2022 Initial Diagnosis   Primary malignant neoplasm of left upper lobe of lung (HCC)   06/04/2022 Cancer Staging   Staging form: Lung, AJCC 8th Edition - Pathologic: Stage IVB (pT1c, pNX, cM1c) - Signed by Earna Coder, MD on 06/04/2022     PHYSICAL EXAMINATION: ECOG PERFORMANCE STATUS: 2 - Symptomatic, <50% confined to bed  Vitals:   06/16/23 1301  BP: 116/69  Pulse: (!) 107  Resp: 16  Temp: 98.7 F (37.1 C)  SpO2: 100%    Filed Weights   06/16/23 1301  Weight: 122 lb 3.2 oz (55.4 kg)      Physical Exam Constitutional:      Appearance: Normal appearance.  HENT:     Head: Normocephalic and atraumatic.  Cardiovascular:     Rate and Rhythm: Normal rate.  Pulmonary:      Effort: Pulmonary effort is normal.  Musculoskeletal:     Right lower leg: No edema.     Left lower leg: No edema.  Skin:    General: Skin is warm.  Neurological:     General: No focal deficit present.     Mental Status: She is oriented to person, place, and time.  Psychiatric:        Mood and Affect: Mood normal.     LABORATORY DATA:  I have reviewed the data as listed    Component Value Date/Time   NA 136 06/16/2023 1244   K 3.9 06/16/2023 1244   CL 109 06/16/2023 1244   CO2 19 (L) 06/16/2023 1244   GLUCOSE 111 (H) 06/16/2023 1244   BUN 15 06/16/2023 1244   CREATININE 1.04 (H) 06/16/2023 1244   CALCIUM 7.9 (L) 06/16/2023 1244   PROT 7.1 06/16/2023 1244   ALBUMIN 3.4 (L) 06/16/2023 1244   AST 20 06/16/2023 1244   ALT 14 06/16/2023 1244   ALKPHOS 133 (H) 06/16/2023 1244   BILITOT 0.5 06/16/2023 1244   GFRNONAA 57 (L) 06/16/2023 1244    No results found for: "SPEP", "UPEP"  Lab Results  Component Value Date   WBC 2.1 (L) 06/16/2023   NEUTROABS 1.2 (L) 06/16/2023   HGB 10.2 (L) 06/16/2023   HCT 31.2 (L) 06/16/2023   MCV 99.4 06/16/2023   PLT 106 (L) 06/16/2023      Chemistry      Component Value Date/Time   NA 136 06/16/2023 1244   K 3.9 06/16/2023 1244   CL 109 06/16/2023 1244   CO2 19 (L) 06/16/2023 1244   BUN 15 06/16/2023 1244   CREATININE 1.04 (H) 06/16/2023 1244      Component Value Date/Time   CALCIUM 7.9 (L) 06/16/2023 1244   ALKPHOS 133 (H) 06/16/2023 1244   AST 20 06/16/2023 1244   ALT 14 06/16/2023 1244   BILITOT 0.5 06/16/2023 1244       RADIOGRAPHIC STUDIES: I  have personally reviewed the radiological images as listed and agreed with the findings in the report. CT ABDOMEN PELVIS W CONTRAST Result Date: 06/02/2023 CLINICAL DATA:  Stage IV lung adenocarcinoma with metastatic disease. Abdominal pain. * Tracking Code: BO * EXAM: CT ABDOMEN AND PELVIS WITH CONTRAST TECHNIQUE: Multidetector CT imaging of the abdomen and pelvis was performed  using the standard protocol following bolus administration of intravenous contrast. RADIATION DOSE REDUCTION: This exam was performed according to the departmental dose-optimization program which includes automated exposure control, adjustment of the mA and/or kV according to patient size and/or use of iterative reconstruction technique. CONTRAST:  OMNIPAQUE IOHEXOL 300 MG/ML  SOLN COMPARISON:  X-ray 05/30/2023. PET-CT 05/16/2023. Standard CT without contrast 02/24/2023. FINDINGS: Lower chest: Breathing motion. There is some linear opacity at the bases likely scar or atelectasis. No pleural effusion. Stable tiny nodules along the course of the major fissure. Small hiatal hernia. Hepatobiliary: Gallstones in the nondilated gallbladder. Patent portal vein. No space-occupying liver lesion. Pancreas: Unremarkable. No pancreatic ductal dilatation or surrounding inflammatory changes. Spleen: Normal in size without focal abnormality. Adrenals/Urinary Tract: Adrenal glands are preserved. Mild bilateral renal atrophy. Cystic lesion in the right kidney measures 2.3 cm with Hounsfield density 15. Bosniak 1 lesion. No imaging follow-up. No collecting system dilatation. Ureters have normal course and caliber extending down to the urinary bladder. Preserved contour to the urinary bladder. Stomach/Bowel: Large bowel has a normal course and caliber. Moderate colonic stool. Redundant course to the transverse and sigmoid colon. Small hiatal hernia. Stomach is nondilated with some fluid and air. Small bowel is nondilated. Level of gaseous distention of the colon is improved from the previous examination. Vascular/Lymphatic: Aortic atherosclerosis. No enlarged abdominal or pelvic lymph nodes. Reproductive: Uterus and bilateral adnexa are unremarkable. Other: Small fat containing umbilical hernia. No free air or free fluid. Musculoskeletal: Trace anterolisthesis of L4 on L5. Scattered degenerative changes. There is once again  lytic and sclerotic bone lesions identified along the spine particularly at L1, L3, L4, L5 and T12. There is compression with Schmorl's node changes at the superior endplate of L3 which is increased from previous. There is also soft tissue bulging towards the central canal related to the neoplasm. Please correlate with any symptoms. The deformity along the superior endplate could represent component of pathologic compression fracture. Extensive sclerotic changes as well along the pelvis and femurs. Streak artifact from the rod along the left femur. IMPRESSION: Nonspecific bowel gas pattern with moderate stool. No bowel obstruction. In the interim since the prior x-ray the level of gaseous distention of the bowel is improving. Mixed lucent and sclerotic bone metastases again identified. The associated compression of the superior endplate of L3 appears increased from the prior CT February 24, 2023. And from PET-CT scan of 05/16/2023. Please correlate with any change in symptoms. There is some canal encroachment as well related to bony deformity and abnormal soft tissue. If needed further workup with MRI can be performed as clinically appropriate. Gallstones. Electronically Signed   By: Karen Kays M.D.   On: 06/02/2023 14:45   DG Abd 2 Views Result Date: 05/30/2023 CLINICAL DATA:  rule out bowel obstruction EXAM: ABDOMEN - 2 VIEW COMPARISON:  None Available. FINDINGS: Gaseous dilatation of the transverse colon. Increased stool burden within the cecum. There is no evidence of free air. No radio-opaque calculi or other significant radiographic abnormality is seen. IMPRESSION: Gaseous dilatation of the transverse colon. Underlying large bowel obstruction or ileus not excluded. Recommend CT abdomen pelvis with  intravenous contrast for further evaluation. Electronically Signed   By: Tish Frederickson M.D.   On: 05/30/2023 20:24   MR SHOULDER LEFT W WO CONTRAST Result Date: 05/21/2023 CLINICAL DATA:  Metastatic non-small  cell lung cancer EXAM: MRI OF THE LEFT SHOULDER WITHOUT AND WITH CONTRAST TECHNIQUE: Multiplanar, multisequence MR imaging of the left shoulder was performed before and after the administration of intravenous contrast. CONTRAST:  6mL GADAVIST GADOBUTROL 1 MMOL/ML IV SOLN COMPARISON:  PET-CT 05/16/2023 FINDINGS: Rotator cuff: Moderate-severe supraspinatus tendinosis with full-thickness, near-complete tear of the distal tendon with approximately 1.0 cm of tendon retraction. Moderate infraspinatus and mild subscapularis tendinosis without tear. Intact teres minor. Muscles: Preserved bulk and signal intensity of the rotator cuff musculature without edema, atrophy, or fatty infiltration. Biceps long head:  Mild intra-articular biceps tendinosis. Acromioclavicular Joint: Mild-to-moderate degenerative changes of the AC joint. Moderate-severe subacromial-subdeltoid bursitis. Glenohumeral Joint: No joint effusion. No cartilage defect. Labrum:  No evidence of labral tear. No paralabral cyst. Bones: No acute fracture. No dislocation. Subcentimeter marrow replacing bone lesion within the distal clavicle. Additional bone lesions within the posterolateral left fifth rib and anterior left third rib. Left third rib pathologic fracture, better seen by recent PET-CT. Other: None. IMPRESSION: LEFT SHOULDER: 1. Bone metastases within the distal clavicle, left third rib, and left fifth rib. Left third rib pathologic fracture, better seen by recent PET-CT. 2. Moderate-severe supraspinatus tendinosis with full-thickness, near-complete tear of the distal tendon. 3. Moderate infraspinatus and mild subscapularis tendinosis without tear. 4. Mild intra-articular biceps tendinosis. 5. Moderate-severe subacromial-subdeltoid bursitis. Electronically Signed   By: Duanne Guess D.O.   On: 05/21/2023 08:30   MR SHOULDER RIGHT W WO CONTRAST Result Date: 05/21/2023 CLINICAL DATA:  Metastatic non-small cell lung cancer EXAM: MRI OF THE RIGHT  SHOULDER WITHOUT AND WITH CONTRAST TECHNIQUE: Multiplanar, multisequence MR imaging of the right shoulder was performed before and after the administration of intravenous contrast. CONTRAST:  6mL GADAVIST GADOBUTROL 1 MMOL/ML IV SOLN COMPARISON:  PET-CT 05/15/2018 FINDINGS: Rotator cuff: Severe rotator cuff tendinosis. Full-thickness, near-complete tears of the supraspinatus and infraspinatus tendons with retraction to the level of the humeral head apex. A portion of each tendon does remain intact. Mild subscapularis tendinosis without tear. Intact teres minor. Muscles:  Mild supraspinatus muscle atrophy. Biceps long head: Severe tendinosis and high-grade partial tearing of the intra-articular biceps tendon. Acromioclavicular Joint: Moderate degenerative changes of the AC joint. Mild-moderate subacromial-subdeltoid bursitis. Glenohumeral Joint: Trace glenohumeral joint effusion. Small intra-articular loose body in the axillary pouch. Mild chondral thinning. Labrum:  Superior labral degeneration. Bones: 1.8 cm marrow replacing bone lesion within the anteromedial aspect of the humeral head with patchy postcontrast enhancement. No pathologic fracture. No additional marrow replacing bone lesions. No fracture or dislocation. Other: No lymphadenopathy within the included right axilla. IMPRESSION: RIGHT SHOULDER: 1. A 1.8 cm marrow replacing bone lesion within the humeral head, compatible with metastatic disease. No pathologic fracture. 2. Severe rotator cuff tendinosis with full-thickness, near-complete tears of the supraspinatus and infraspinatus tendons. Mild supraspinatus muscle atrophy. 3. Severe tendinosis and high-grade partial tearing of the intra-articular biceps tendon. 4. Moderate AC joint osteoarthritis. Mild-moderate subacromial-subdeltoid bursitis. 5. Mild glenohumeral osteoarthritis. Electronically Signed   By: Duanne Guess D.O.   On: 05/21/2023 08:21   MR HIP RIGHT W WO CONTRAST Result Date:  05/21/2023 CLINICAL DATA:  Metastatic non-small cell lung cancer EXAM: MRI OF THE RIGHT HIP WITHOUT AND WITH CONTRAST MRI OF THE LEFT HIP WITHOUT AND WITH CONTRAST TECHNIQUE: Multiplanar, multisequence MR  imaging was performed both before and after administration of intravenous contrast. CONTRAST:  6mL GADAVIST GADOBUTROL 1 MMOL/ML IV SOLN COMPARISON:  X-ray 05/09/2023, PET-CT 05/16/2023, MRI 11/20/2022 FINDINGS: Bones/Joint/Cartilage Numerous marrow-replacing metastatic bone lesions throughout the pelvis, lower lumbar spine, and proximal left femur. Several of the lesions demonstrate postcontrast enhancement. Overall, there has been slight progression compared to the 11/20/2022 MRI. Postsurgical changes from ORIF the left femur. 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 No evidence of an acute pathologic fracture. No pelvic diastasis. Trace bilateral SI joint effusions. Moderate osteoarthritic changes of both hips. No hip joint effusion. No femoral head avascular necrosis. Ligaments Intact. Muscles and Tendons No acute musculotendinous abnormality. Soft tissues No soft tissue edema or fluid collection. No pelvic or inguinal lymphadenopathy. No extraosseous mass lesion. IMPRESSION: 1. 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 Overall, there has been slight progression compared to the 11/20/2022 MRI. 2. 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 No evidence of an acute pathologic fracture. Electronically Signed   By: Duanne Guess D.O.   On: 05/21/2023 08:15   MR HIP LEFT W WO CONTRAST Result Date: 05/21/2023 CLINICAL DATA:  Metastatic non-small cell lung cancer EXAM: MRI OF THE RIGHT HIP WITHOUT AND WITH CONTRAST MRI OF THE LEFT HIP WITHOUT AND WITH CONTRAST TECHNIQUE: Multiplanar, multisequence MR imaging was performed both before and after administration of intravenous contrast. CONTRAST:  6mL GADAVIST GADOBUTROL  1 MMOL/ML IV SOLN COMPARISON:  X-ray 05/09/2023, PET-CT 05/16/2023, MRI 11/20/2022 FINDINGS: Bones/Joint/Cartilage Numerous marrow-replacing metastatic bone lesions throughout the pelvis, lower lumbar spine, and proximal left femur. Several of the lesions demonstrate postcontrast enhancement. Overall, there has been slight progression compared to the 11/20/2022 MRI. Postsurgical changes from ORIF the left femur. 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 No evidence of an acute pathologic fracture. No pelvic diastasis. Trace bilateral SI joint effusions. Moderate osteoarthritic changes of both hips. No hip joint effusion. No femoral head avascular necrosis. Ligaments Intact. Muscles and Tendons No acute musculotendinous abnormality. Soft tissues No soft tissue edema or fluid collection. No pelvic or inguinal lymphadenopathy. No extraosseous mass lesion. IMPRESSION: 1. 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 Overall, there has been slight progression compared to the 11/20/2022 MRI. 2. 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 No evidence of an acute pathologic fracture. Electronically Signed   By: Duanne Guess D.O.   On: 05/21/2023 08:15

## 2023-06-16 NOTE — Progress Notes (Signed)
 Patient finished her radiation last Wednesday, which it has made her weaker. She still isn't eating right or drinking enough. She is still having some stiffness, which her pain medication is helping her but still causes some constipation.

## 2023-06-16 NOTE — Telephone Encounter (Signed)
 Prior auth submitted for the flexeril- Clinical Key [id number]

## 2023-06-17 ENCOUNTER — Encounter: Payer: Self-pay | Admitting: Internal Medicine

## 2023-06-17 NOTE — Addendum Note (Signed)
 Encounter addended by: Dagoberto Reef on: 06/17/2023 10:09 AM  Actions taken: Imaging Exam begun

## 2023-06-17 NOTE — Addendum Note (Signed)
 Encounter addended by: Dagoberto Reef on: 06/17/2023 10:08 AM  Actions taken: Imaging Exam begun, Charge Capture section accepted

## 2023-06-17 NOTE — Telephone Encounter (Signed)
 Prescription approved.

## 2023-06-18 DIAGNOSIS — M25552 Pain in left hip: Secondary | ICD-10-CM | POA: Diagnosis not present

## 2023-06-18 DIAGNOSIS — M7542 Impingement syndrome of left shoulder: Secondary | ICD-10-CM | POA: Diagnosis not present

## 2023-06-18 DIAGNOSIS — M7541 Impingement syndrome of right shoulder: Secondary | ICD-10-CM | POA: Diagnosis not present

## 2023-06-19 ENCOUNTER — Encounter: Payer: Self-pay | Admitting: Internal Medicine

## 2023-06-20 ENCOUNTER — Encounter: Payer: Self-pay | Admitting: Pulmonary Disease

## 2023-06-23 ENCOUNTER — Other Ambulatory Visit: Payer: PPO

## 2023-06-23 ENCOUNTER — Ambulatory Visit: Payer: PPO

## 2023-06-23 ENCOUNTER — Ambulatory Visit: Payer: PPO | Admitting: Internal Medicine

## 2023-06-24 ENCOUNTER — Inpatient Hospital Stay: Payer: PPO | Attending: Internal Medicine

## 2023-06-24 DIAGNOSIS — Z5189 Encounter for other specified aftercare: Secondary | ICD-10-CM | POA: Insufficient documentation

## 2023-06-24 DIAGNOSIS — N261 Atrophy of kidney (terminal): Secondary | ICD-10-CM | POA: Insufficient documentation

## 2023-06-24 DIAGNOSIS — C7951 Secondary malignant neoplasm of bone: Secondary | ICD-10-CM | POA: Insufficient documentation

## 2023-06-24 DIAGNOSIS — K5900 Constipation, unspecified: Secondary | ICD-10-CM | POA: Insufficient documentation

## 2023-06-24 DIAGNOSIS — M533 Sacrococcygeal disorders, not elsewhere classified: Secondary | ICD-10-CM | POA: Insufficient documentation

## 2023-06-24 DIAGNOSIS — M4316 Spondylolisthesis, lumbar region: Secondary | ICD-10-CM | POA: Insufficient documentation

## 2023-06-24 DIAGNOSIS — C7931 Secondary malignant neoplasm of brain: Secondary | ICD-10-CM | POA: Insufficient documentation

## 2023-06-24 DIAGNOSIS — Z7952 Long term (current) use of systemic steroids: Secondary | ICD-10-CM | POA: Insufficient documentation

## 2023-06-24 DIAGNOSIS — Z79899 Other long term (current) drug therapy: Secondary | ICD-10-CM | POA: Insufficient documentation

## 2023-06-24 DIAGNOSIS — Z5111 Encounter for antineoplastic chemotherapy: Secondary | ICD-10-CM | POA: Insufficient documentation

## 2023-06-24 DIAGNOSIS — D6481 Anemia due to antineoplastic chemotherapy: Secondary | ICD-10-CM | POA: Insufficient documentation

## 2023-06-24 DIAGNOSIS — K429 Umbilical hernia without obstruction or gangrene: Secondary | ICD-10-CM | POA: Insufficient documentation

## 2023-06-24 DIAGNOSIS — G893 Neoplasm related pain (acute) (chronic): Secondary | ICD-10-CM | POA: Insufficient documentation

## 2023-06-24 DIAGNOSIS — Z923 Personal history of irradiation: Secondary | ICD-10-CM | POA: Insufficient documentation

## 2023-06-24 DIAGNOSIS — C3412 Malignant neoplasm of upper lobe, left bronchus or lung: Secondary | ICD-10-CM | POA: Insufficient documentation

## 2023-06-24 DIAGNOSIS — I700 Atherosclerosis of aorta: Secondary | ICD-10-CM | POA: Insufficient documentation

## 2023-06-24 DIAGNOSIS — R630 Anorexia: Secondary | ICD-10-CM | POA: Insufficient documentation

## 2023-06-24 DIAGNOSIS — K8020 Calculus of gallbladder without cholecystitis without obstruction: Secondary | ICD-10-CM | POA: Insufficient documentation

## 2023-06-24 DIAGNOSIS — N189 Chronic kidney disease, unspecified: Secondary | ICD-10-CM | POA: Insufficient documentation

## 2023-06-24 DIAGNOSIS — K449 Diaphragmatic hernia without obstruction or gangrene: Secondary | ICD-10-CM | POA: Insufficient documentation

## 2023-06-24 DIAGNOSIS — T451X5A Adverse effect of antineoplastic and immunosuppressive drugs, initial encounter: Secondary | ICD-10-CM | POA: Insufficient documentation

## 2023-06-24 NOTE — Progress Notes (Signed)
 Nutrition Follow-up:  Patient with stage IV lung cancer.  Patient on gemcitabine.    Spoke with patient via phone.  Reports that she is eating but she still does not have much of an appetite.  Is not taking olanzapine due to it making her feel "shut down".  Has only eaten eggs and bacon around 10:30 this morning so far today.  In the last few days has had pork chop with pinto beans and corn muffin, sub sandwich, hamburger, oatmeal.  Likes to snack on peanut butter crackers and sliced apples.  Reports that bowels are moving.  Drinks protein shakes   Medications: reviewed  Labs: reviewed  Anthropometrics:   Weight 122 lb 3.2 oz on 2/24  134 lb on 2/10 134 lb on 2/10 137 lb on 7/14 136 lb on 10/1 139 lb on 8/9 129 lb on 7/29   NUTRITION DIAGNOSIS: Unintentional weight loss continues    INTERVENTION:  Consider trial of alternative appetite stimulant.  Message sent to provider Recommend eating within 1 hour of waking and then q 2 hours (small snack/mini meal).   Encouraged choosing highest calories food that she likes.       MONITORING, EVALUATION, GOAL: weight trends, intake   NEXT VISIT: Thursday, March 13 during infusion  Ajah Vanhoose B. Freida Busman, RD, LDN Registered Dietitian [phone]

## 2023-06-26 ENCOUNTER — Telehealth: Payer: Self-pay | Admitting: *Deleted

## 2023-06-26 ENCOUNTER — Ambulatory Visit: Payer: PPO

## 2023-06-26 ENCOUNTER — Other Ambulatory Visit: Payer: PPO

## 2023-06-26 ENCOUNTER — Inpatient Hospital Stay: Payer: PPO

## 2023-06-26 ENCOUNTER — Inpatient Hospital Stay: Payer: PPO | Admitting: Internal Medicine

## 2023-06-26 ENCOUNTER — Encounter: Payer: Self-pay | Admitting: Internal Medicine

## 2023-06-26 ENCOUNTER — Ambulatory Visit: Payer: PPO | Admitting: Internal Medicine

## 2023-06-26 VITALS — BP 141/80 | HR 115 | Temp 97.3°F | Resp 16 | Ht 59.0 in | Wt 129.0 lb

## 2023-06-26 VITALS — BP 145/79 | HR 83 | Resp 17

## 2023-06-26 DIAGNOSIS — R29898 Other symptoms and signs involving the musculoskeletal system: Secondary | ICD-10-CM | POA: Diagnosis not present

## 2023-06-26 DIAGNOSIS — Z7952 Long term (current) use of systemic steroids: Secondary | ICD-10-CM | POA: Diagnosis not present

## 2023-06-26 DIAGNOSIS — M533 Sacrococcygeal disorders, not elsewhere classified: Secondary | ICD-10-CM | POA: Diagnosis not present

## 2023-06-26 DIAGNOSIS — Z5189 Encounter for other specified aftercare: Secondary | ICD-10-CM | POA: Diagnosis not present

## 2023-06-26 DIAGNOSIS — R630 Anorexia: Secondary | ICD-10-CM | POA: Diagnosis not present

## 2023-06-26 DIAGNOSIS — Z9181 History of falling: Secondary | ICD-10-CM

## 2023-06-26 DIAGNOSIS — C3490 Malignant neoplasm of unspecified part of unspecified bronchus or lung: Secondary | ICD-10-CM

## 2023-06-26 DIAGNOSIS — D6481 Anemia due to antineoplastic chemotherapy: Secondary | ICD-10-CM | POA: Diagnosis not present

## 2023-06-26 DIAGNOSIS — K5900 Constipation, unspecified: Secondary | ICD-10-CM | POA: Diagnosis not present

## 2023-06-26 DIAGNOSIS — Z923 Personal history of irradiation: Secondary | ICD-10-CM | POA: Diagnosis not present

## 2023-06-26 DIAGNOSIS — C7931 Secondary malignant neoplasm of brain: Secondary | ICD-10-CM | POA: Diagnosis not present

## 2023-06-26 DIAGNOSIS — M4316 Spondylolisthesis, lumbar region: Secondary | ICD-10-CM | POA: Diagnosis not present

## 2023-06-26 DIAGNOSIS — N261 Atrophy of kidney (terminal): Secondary | ICD-10-CM | POA: Diagnosis not present

## 2023-06-26 DIAGNOSIS — C7951 Secondary malignant neoplasm of bone: Secondary | ICD-10-CM | POA: Diagnosis not present

## 2023-06-26 DIAGNOSIS — I700 Atherosclerosis of aorta: Secondary | ICD-10-CM | POA: Diagnosis not present

## 2023-06-26 DIAGNOSIS — T451X5A Adverse effect of antineoplastic and immunosuppressive drugs, initial encounter: Secondary | ICD-10-CM | POA: Diagnosis not present

## 2023-06-26 DIAGNOSIS — K429 Umbilical hernia without obstruction or gangrene: Secondary | ICD-10-CM | POA: Diagnosis not present

## 2023-06-26 DIAGNOSIS — C3412 Malignant neoplasm of upper lobe, left bronchus or lung: Secondary | ICD-10-CM | POA: Diagnosis not present

## 2023-06-26 DIAGNOSIS — Z5111 Encounter for antineoplastic chemotherapy: Secondary | ICD-10-CM | POA: Diagnosis not present

## 2023-06-26 DIAGNOSIS — K449 Diaphragmatic hernia without obstruction or gangrene: Secondary | ICD-10-CM | POA: Diagnosis not present

## 2023-06-26 DIAGNOSIS — Z79899 Other long term (current) drug therapy: Secondary | ICD-10-CM | POA: Diagnosis not present

## 2023-06-26 DIAGNOSIS — N189 Chronic kidney disease, unspecified: Secondary | ICD-10-CM | POA: Diagnosis not present

## 2023-06-26 DIAGNOSIS — G893 Neoplasm related pain (acute) (chronic): Secondary | ICD-10-CM | POA: Diagnosis not present

## 2023-06-26 DIAGNOSIS — K8020 Calculus of gallbladder without cholecystitis without obstruction: Secondary | ICD-10-CM | POA: Diagnosis not present

## 2023-06-26 LAB — CBC WITH DIFFERENTIAL (CANCER CENTER ONLY)
Abs Immature Granulocytes: 0.02 10*3/uL (ref 0.00–0.07)
Basophils Absolute: 0 10*3/uL (ref 0.0–0.1)
Basophils Relative: 1 %
Eosinophils Absolute: 0 10*3/uL (ref 0.0–0.5)
Eosinophils Relative: 1 %
HCT: 31.7 % — ABNORMAL LOW (ref 36.0–46.0)
Hemoglobin: 10.4 g/dL — ABNORMAL LOW (ref 12.0–15.0)
Immature Granulocytes: 1 %
Lymphocytes Relative: 21 %
Lymphs Abs: 0.7 10*3/uL (ref 0.7–4.0)
MCH: 32.4 pg (ref 26.0–34.0)
MCHC: 32.8 g/dL (ref 30.0–36.0)
MCV: 98.8 fL (ref 80.0–100.0)
Monocytes Absolute: 0.2 10*3/uL (ref 0.1–1.0)
Monocytes Relative: 7 %
Neutro Abs: 2.4 10*3/uL (ref 1.7–7.7)
Neutrophils Relative %: 69 %
Platelet Count: 110 10*3/uL — ABNORMAL LOW (ref 150–400)
RBC: 3.21 MIL/uL — ABNORMAL LOW (ref 3.87–5.11)
RDW: 13.2 % (ref 11.5–15.5)
WBC Count: 3.4 10*3/uL — ABNORMAL LOW (ref 4.0–10.5)
nRBC: 0 % (ref 0.0–0.2)

## 2023-06-26 LAB — CMP (CANCER CENTER ONLY)
ALT: 16 U/L (ref 0–44)
AST: 23 U/L (ref 15–41)
Albumin: 3.5 g/dL (ref 3.5–5.0)
Alkaline Phosphatase: 156 U/L — ABNORMAL HIGH (ref 38–126)
Anion gap: 6 (ref 5–15)
BUN: 25 mg/dL — ABNORMAL HIGH (ref 8–23)
CO2: 20 mmol/L — ABNORMAL LOW (ref 22–32)
Calcium: 8.2 mg/dL — ABNORMAL LOW (ref 8.9–10.3)
Chloride: 112 mmol/L — ABNORMAL HIGH (ref 98–111)
Creatinine: 1.18 mg/dL — ABNORMAL HIGH (ref 0.44–1.00)
GFR, Estimated: 49 mL/min — ABNORMAL LOW (ref >60)
Glucose, Bld: 130 mg/dL — ABNORMAL HIGH (ref 70–99)
Potassium: 3.8 mmol/L (ref 3.5–5.1)
Sodium: 138 mmol/L (ref 135–145)
Total Bilirubin: 0.5 mg/dL (ref 0.0–1.2)
Total Protein: 7.1 g/dL (ref 6.5–8.1)

## 2023-06-26 MED ORDER — GEMCITABINE HCL CHEMO INJECTION 1 GM/26.3ML
800.0000 mg/m2 | Freq: Once | INTRAVENOUS | Status: AC
  Administered 2023-06-26: 1254 mg via INTRAVENOUS
  Filled 2023-06-26: qty 32.98

## 2023-06-26 MED ORDER — PROCHLORPERAZINE MALEATE 10 MG PO TABS
10.0000 mg | ORAL_TABLET | Freq: Once | ORAL | Status: AC
Start: 2023-06-26 — End: 2023-06-26
  Administered 2023-06-26: 10 mg via ORAL
  Filled 2023-06-26: qty 1

## 2023-06-26 MED ORDER — HEPARIN SOD (PORK) LOCK FLUSH 100 UNIT/ML IV SOLN
500.0000 [IU] | Freq: Once | INTRAVENOUS | Status: AC | PRN
  Administered 2023-06-26: 500 [IU]
  Filled 2023-06-26: qty 5

## 2023-06-26 MED ORDER — SODIUM CHLORIDE 0.9 % IV SOLN
INTRAVENOUS | Status: DC
Start: 2023-06-26 — End: 2023-06-26
  Filled 2023-06-26: qty 250

## 2023-06-26 NOTE — Patient Instructions (Signed)
 [facility] - A DEPT OF [facility]  Discharge Instructions: Thank you for choosing [facility] to provide your oncology and hematology care.  If you have a lab appointment with the Cancer Center, please go directly to the Cancer Center and check in at the registration area.  Wear comfortable clothing and clothing appropriate for easy access to any Portacath or PICC line.   We strive to give you quality time with your provider. You may need to reschedule your appointment if you arrive late (15 or more minutes).  Arriving late affects you and other patients whose appointments are after yours.  Also, if you miss three or more appointments without notifying the office, you may be dismissed from the clinic at the provider's discretion.      For prescription refill requests, have your pharmacy contact our office and allow 72 hours for refills to be completed.    Today you received the following chemotherapy and/or immunotherapy agents Gemzar      To help prevent nausea and vomiting after your treatment, we encourage you to take your nausea medication as directed.  BELOW ARE SYMPTOMS THAT SHOULD BE REPORTED IMMEDIATELY: *FEVER GREATER THAN 100.4 F (38 C) OR HIGHER *CHILLS OR SWEATING *NAUSEA AND VOMITING THAT IS NOT CONTROLLED WITH YOUR NAUSEA MEDICATION *UNUSUAL SHORTNESS OF BREATH *UNUSUAL BRUISING OR BLEEDING *URINARY PROBLEMS (pain or burning when urinating, or frequent urination) *BOWEL PROBLEMS (unusual diarrhea, constipation, pain near the anus) TENDERNESS IN MOUTH AND THROAT WITH OR WITHOUT PRESENCE OF ULCERS (sore throat, sores in mouth, or a toothache) UNUSUAL RASH, SWELLING OR PAIN  UNUSUAL VAGINAL DISCHARGE OR ITCHING   Items with * indicate a potential emergency and should be followed up as soon as possible or go to the Emergency Department if any problems should occur.  Please show the CHEMOTHERAPY ALERT CARD or IMMUNOTHERAPY ALERT  CARD at check-in to the Emergency Department and triage nurse.  Should you have questions after your visit or need to cancel or reschedule your appointment, please contact [facility] - A DEPT OF [facility]  [phone] and follow the prompts.  Office hours are 8:00 a.m. to 4:30 p.m. Monday - Friday. Please note that voicemails left after 4:00 p.m. may not be returned until the following business day.  We are closed weekends and major holidays. You have access to a nurse at all times for urgent questions. Please call the main number to the clinic [phone] and follow the prompts.  For any non-urgent questions, you may also contact your provider using MyChart. We now offer e-Visits for anyone 23 and older to request care online for non-urgent symptoms. For details visit [email or web address].   Also download the MyChart app! Go to the app store, search "MyChart", open the app, select [place], and log in with your MyChart username and password.   Gemcitabine Injection What is this medication? GEMCITABINE (jem SYE ta been) treats some types of cancer. It works by slowing down the growth of cancer cells. This medicine may be used for other purposes; ask your health care provider or pharmacist if you have questions. COMMON BRAND NAME(S): Gemzar, Infugem What should I tell my care team before I take this medication? They need to know if you have any of these conditions: Blood disorders Infection Kidney disease Liver disease Lung or breathing disease, such as asthma or COPD Recent or ongoing radiation therapy An unusual or  allergic reaction to gemcitabine, other medications, foods, dyes, or preservatives If you or your partner are pregnant or trying to get pregnant Breast-feeding How should I use this medication? This medication is injected into a vein. It is given by your care team in a hospital or clinic setting. Talk to your care team about the use  of this medication in children. Special care may be needed. Overdosage: If you think you have taken too much of this medicine contact a poison control center or emergency room at once. NOTE: This medicine is only for you. Do not share this medicine with others. What if I miss a dose? Keep appointments for follow-up doses. It is important not to miss your dose. Call your care team if you are unable to keep an appointment. What may interact with this medication? Interactions have not been studied. This list may not describe all possible interactions. Give your health care provider a list of all the medicines, herbs, non-prescription drugs, or dietary supplements you use. Also tell them if you smoke, drink alcohol, or use illegal drugs. Some items may interact with your medicine. What should I watch for while using this medication? Your condition will be monitored carefully while you are receiving this medication. This medication may make you feel generally unwell. This is not uncommon, as chemotherapy can affect healthy cells as well as cancer cells. Report any side effects. Continue your course of treatment even though you feel ill unless your care team tells you to stop. In some cases, you may be given additional medications to help with side effects. Follow all directions for their use. This medication may increase your risk of getting an infection. Call your care team for advice if you get a fever, chills, sore throat, or other symptoms of a cold or flu. Do not treat yourself. Try to avoid being around people who are sick. This medication may increase your risk to bruise or bleed. Call your care team if you notice any unusual bleeding. Be careful brushing or flossing your teeth or using a toothpick because you may get an infection or bleed more easily. If you have any dental work done, tell your dentist you are receiving this medication. Avoid taking medications that contain aspirin, acetaminophen,  ibuprofen, naproxen, or ketoprofen unless instructed by your care team. These medications may hide a fever. Talk to your care team if you or your partner wish to become pregnant or think you might be pregnant. This medication can cause serious birth defects if taken during pregnancy and for 6 months after the last dose. A negative pregnancy test is required before starting this medication. A reliable form of contraception is recommended while taking this medication and for 6 months after the last dose. Talk to your care team about effective forms of contraception. Do not father a child while taking this medication and for 3 months after the last dose. Use a condom while having sex during this time period. Do not breastfeed while taking this medication and for at least 1 week after the last dose. This medication may cause infertility. Talk to your care team if you are concerned about your fertility. What side effects may I notice from receiving this medication? Side effects that you should report to your care team as soon as possible: Allergic reactions--skin rash, itching, hives, swelling of the face, lips, tongue, or throat Capillary leak syndrome--stomach or muscle pain, unusual weakness or fatigue, feeling faint or lightheaded, decrease in the amount of urine, swelling  of the ankles, hands, or feet, trouble breathing Infection--fever, chills, cough, sore throat, wounds that don't heal, pain or trouble when passing urine, general feeling of discomfort or being unwell Liver injury--right upper belly pain, loss of appetite, nausea, light-colored stool, dark yellow or brown urine, yellowing skin or eyes, unusual weakness or fatigue Low red blood cell level--unusual weakness or fatigue, dizziness, headache, trouble breathing Lung injury--shortness of breath or trouble breathing, cough, spitting up blood, chest pain, fever Stomach pain, bloody diarrhea, pale skin, unusual weakness or fatigue, decrease in  the amount of urine, which may be signs of hemolytic uremic syndrome Sudden and severe headache, confusion, change in vision, seizures, which may be signs of posterior reversible encephalopathy syndrome (PRES) Unusual bruising or bleeding Side effects that usually do not require medical attention (report to your care team if they continue or are bothersome): Diarrhea Drowsiness Hair loss Nausea Pain, redness, or swelling with sores inside the mouth or throat Vomiting This list may not describe all possible side effects. Call your doctor for medical advice about side effects. You may report side effects to FDA at 1-800-FDA-1088. Where should I keep my medication? This medication is given in a hospital or clinic. It will not be stored at home. NOTE: This sheet is a summary. It may not cover all possible information. If you have questions about this medicine, talk to your doctor, pharmacist, or health care provider.  2024 [employer]vier/Gold Standard (2021-08-14 00:00:00)

## 2023-06-26 NOTE — Telephone Encounter (Addendum)
 Faxed [facility] PT referral to [facility] patient at [phone].   Note-This referral was sent 1 month ago; however, Frances Furbish attempted to call patient x 4. Eunice Blase with daughters, but family never  contacted [place] back. Referral was closed and therefore a new referral is now required.

## 2023-06-26 NOTE — Progress Notes (Signed)
 [facility] OFFICE PROGRESS NOTE  Patient Care Team: Dana Allan, MD as PCP - General (Family Medicine) Glory Buff, RN as Oncology Nurse Navigator Michaelyn Barter, MD as Consulting Physician (Oncology) Sondra Come, MD as Consulting Physician (Urology)  TREATMENT:  Right hip palliative RT 30 cGy completed 12/18/2021 Carboplatin, alimta and Keytruda x 4 cycles completed 02/19/22.  Discontinued maintenance alimta (04-23-22) due to worsening renal dysfunction, transaminitis and cytopenias Keytruda maintenance-discontinued on 09/17/2022 due to disease progression Palliative RT to left frontal lesion 5 fx completed 09/27/22 Palliative RT to left hip completed September 2024 Carboplatin (AUC 4) and paclitaxel 150 mg/m2 - x 5 cycles ( 10/07/2022- 01/21/2023). Maintenance Avastin 15 mg/kg- 03/27/2023- 04/24/23 Gemcitabine 800 mg/m day 1 day 8, started on 06/26/2023  ASSESSMENT & PLAN:   # Primary lung adenocarcinoma (HCC), Stage IV, PDL1 1% -Progressed through first-line [place] Alimta, Keytruda and then [place] maintenance on 09/17/2022.  -s/p second line carbo AUC 4 and Taxol 150 mg/m on 10/07/2022.  Completed 5 cycles and then discontinued due to severe cytopenias.  Patient agreed for maintenance Avastin (was not added with chemo because patient was hesitant).  Progressed through maintenance Avastin.  Last dose 04/24/2023.  -Foundation 1 from August 2023 no targets.  Liquid foundation 1 CDX from June 2024 with no targetable mutation.  Repeat lung biopsy on 12/17/2022 sent for foundation 1 CDX testing was limited due to inadequate sample but no targets seen.  RNA fusion panel was sent on primary biopsy from August 2023.  It could not be run due to low tumor purity.  HER2 IHC 0.  C-Met amplification negative by FISH.  Lab report polysomy 7.  -PET from 05/16/2023 showed disease progression in the bones.  Continue care coordination with Dr. Kemper Durie at [facility].   -Completed RT to lumbar spine  for pain control on 06/11/2022  -Labs reviewed and acceptable for treatment.  Her ANC has recovered from myelosuppression from radiation.  Will proceed with cycle 1 day 1 of gemcitabine 800 mg/m.  Follow-up in 1 week for day 8.  Plan to repeat PET scan after 3 cycles to assess treatment response.  # Constipation -Continue with Senokot, MiraLAX as needed.  # Cancer-related pain -Follows with palliative care. -Xtampza 9 mg twice daily with oxycodone IR 5 mg every 6 as needed -Flexeril 5 mg twice daily as needed for muscle stiffness -After spine RT, patient has completely stopped pain medication.  Reports her pain is manageable.  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   -Follow-up with Dr. Barbaraann Cao.  Dr. Barbaraann Cao discussed the case with Dr. Rushie Chestnut who plans to hold off on radiation at this time.  Since the growth is minimal and patient is asymptomatic. -Follow-up MRI brain scheduled in April 2025.  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 Cr stabilized.  -will continue to monitor.  # Chemotherapy-induced anemia  -Aranesp 300 mcg as needed.   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 Aggie Cosier on 12/18/2021 -Completed palliative RT to left hip on 01/01/2023.   -s/p palliative RT to lumbar spine completed on 06/12/2023  # Metastatic bone lesions -Xgeva 120 mg subcu monthly (last dose on 05/26/2023) -Plan is for q1 to 3 months depending on the calcium levels.  # Poor appetite # Weight loss -Continue follow-up with nutrition. -Did not tolerate olanzapine. did not make her feel right.  Has  declined other appetite stimulants  # Limited mobility # Risk of fall -Referral to home health PT because patient has limited mobility from metastatic lung cancer to the bone causing pathological fractures, pain and she is at high risk of fall.  #  Access - port  RTC in 1 week for MD visit, labs, Gemzar  Orders Placed This Encounter  Procedures   Ambulatory referral to Home Health    Referral Priority:   Urgent    Referral Type:   Home Health Care    Referral Reason:   Specialty Services Required    Requested Specialty:   Home Health Services    Number of Visits Requested:   1    [facility]- 12/06/2021   All questions were answered. The patient knows to call the clinic with any problems, questions or concerns. The total time spent in the appointment was 30 minutes encounter with patients including review of chart and various tests results, discussions about plan of care and coordination of care plan   Michaelyn Barter, MD 06/26/2023 11:46 AM  INTERVAL HISTORY: Patient seen today as follow-up accompanied with daughter for management of stage IV lung adenocarcinoma. Patient feeling better today. Pain is controlled. Stopped all her pain meds. Appetite is fair. Declined appetite stimulant.    REVIEW OF SYSTEMS:   Positive ROS as above.  Rest 10 points ROS negative   I have reviewed the past medical history, past surgical history, social history and family history with the patient and they are unchanged from previous note.  ALLERGIES:  is allergic to diclofenac, lisinopril, and sulfa antibiotics.  MEDICATIONS:  Current Outpatient Medications  Medication Sig Dispense Refill   albuterol (VENTOLIN HFA) 108 (90 Base) MCG/ACT inhaler Inhale 2 puffs into the lungs every 6 (six) hours as needed for wheezing or shortness of breath. 18 g 0   Azelastine HCl 137 MCG/SPRAY SOLN Place 2 sprays into both nostrils 2 (two) times daily.     Cholecalciferol (VITAMIN D-3 PO) Take by mouth.     cyclobenzaprine (FLEXERIL) 5 MG tablet Take 1 tablet (5 mg total) by mouth 2 (two) times daily as needed for muscle spasms. 30 tablet 0   dexamethasone (DECADRON) 4 MG tablet Take 1 tablet (4 mg total) by mouth daily. Take for 3 days after chemotherapy  30 tablet 0   fluticasone (FLONASE) 50 MCG/ACT nasal spray USE TWO SPRAYS IN EACH NOSTRIL ONCE DAILY prn 16 g 12   gabapentin (NEURONTIN) 100 MG capsule Take 1 capsule (100 mg total) by mouth 3 (three) times daily. 90 capsule 0   loratadine (CLARITIN) 10 MG tablet TAKE ONE TABLET BY MOUTH DAILY AS NEEDED FOR ALLERGIES 90 tablet 3   losartan (COZAAR) 100 MG tablet Take 1 tablet (100 mg total) by mouth daily. 90 tablet 3   MULTIPLE VITAMIN PO Take 1 tablet by mouth daily.     naloxone (NARCAN) nasal spray 4 mg/0.1 mL SPRAY 1 SPRAY INTO ONE NOSTRIL AS DIRECTED FOR OPIOID OVERDOSE (TURN PERSON ON SIDE AFTER DOSE. IF NO RESPONSE IN 2-3 MINUTES OR PERSON RESPONDS BUT RELAPSES, REPEAT USING A NEW SPRAY DEVICE AND SPRAY INTO THE OTHER NOSTRIL. CALL 911 AFTER USE.) * EMERGENCY USE ONLY * 1 each 0   OLANZapine (ZYPREXA) 5 MG tablet Take 1 tablet (5 mg total) by mouth at bedtime. For appetite 30 tablet 3   ondansetron (ZOFRAN) 8 MG tablet Take 1 tablet (8 mg total) by mouth every 8 (eight) hours as needed for nausea  or vomiting. 30 tablet 3   oxyCODONE (OXY IR/ROXICODONE) 5 MG immediate release tablet Take 1-2 tablets (5-10 mg total) by mouth every 4 (four) hours as needed for severe pain (pain score 7-10). 60 tablet 0   oxyCODONE ER (XTAMPZA ER) 9 MG C12A Take 1 capsule by mouth every 12 (twelve) hours. 60 capsule 0   senna (SENOKOT) 8.6 MG TABS tablet Take 1 tablet by mouth daily.     lactulose (CHRONULAC) 10 GM/15ML solution Take 15 mLs (10 g total) by mouth 3 (three) times daily. (Patient not taking: Reported on 06/26/2023) 236 mL 0   magic mouthwash w/lidocaine SOLN Take 5 mLs by mouth 4 (four) times daily as needed for mouth pain. Sig: Swish/Swallow 5-10 ml four times a day as needed. Dispense 480 ml. 1RF (Patient not taking: Reported on 06/26/2023) 480 mL 1   valACYclovir (VALTREX) 500 MG tablet Take 1 tablet (500 mg total) by mouth 2 (two) times daily. For shingles prophylaxis (Patient not taking: Reported on  06/26/2023) 60 tablet 1   No current facility-administered medications for this visit.   Facility-Administered Medications Ordered in Other Visits  Medication Dose Route Frequency Provider Last Rate Last Admin   0.9 %  sodium chloride infusion   Intravenous Continuous Michaelyn Barter, MD 10 mL/hr at 06/26/23 1044 New Bag at 06/26/23 1044   heparin lock flush 100 UNIT/ML injection            heparin lock flush 100 unit/mL  500 Units Intravenous Once Michaelyn Barter, MD       heparin lock flush 100 unit/mL  500 Units Intracatheter Once PRN Michaelyn Barter, MD       sodium chloride flush (NS) 0.9 % injection 10 mL  10 mL Intravenous Once Michaelyn Barter, MD        SUMMARY OF ONCOLOGIC HISTORY: Oncology History  Primary lung adenocarcinoma (HCC)  10/10/2021 Initial Diagnosis   Primary lung adenocarcinoma (HCC) Stage IV  Patient seen by PCP for wheezing for 2 weeks. Imaging with CXR followed by CT chest showed Spiculated 2.1 x 2.2 cm left upper lobe pulmonary nodule with associated pleural tethering. Innumerable subcentimeter pulmonary nodules throughout the lungs.      11/14/2021 PET scan   IMPRESSION: 1. Left suprahilar nodule with maximum SUV 4.5, and a more distal 2.2 cm left upper lobe nodule with maximum SUV of 5.5. 2. Innumerable scattered small pulmonary nodules, most of which are under 5 mm in diameter, throughout both lungs. Some are cavitary. Possibilities include cavitating hematogenous disseminated malignancy versus infectious etiology subset as septic emboli. Most of the nodules are stable, some are minimally enlarged compared to 10/10/2021. 3. Abnormal mild permeative type bony findings in the right hemipelvis associated with substantially accentuated metabolic activity. Possible associated pathologic fracture through the right quadrilateral plate and right acetabulum. The findings involve the right ilium and ischium but also the right lateral sacral ala and a small focus  in the left sacrum.    Pathology Results   She had transbronchial biopsies by Dr. Jayme Cloud on 11/19/21.  Pathology from LUL nodule showed adenocarcinoma, consistent with lung primary.    11/19/2021 Cancer Staging   Staging form: Lung, AJCC 8th Edition - Clinical stage from 11/19/2021: Stage IVB (cT1c, cM1c) - Signed by Michaelyn Barter, MD on 11/27/2021 Stage prefix: Initial diagnosis   11/27/2021 Imaging   MRI pelvis  IMPRESSION: 1. Abnormal marrow signal throughout the right ilium involving the right acetabulum, right superior pubic ramus and right inferior pubic  ramus, right side of the sacrum and a smaller focus of abnormal marrow lesion in the left side of the sacrum. Findings are concerning for metastatic disease. 2. Nondisplaced pathologic fracture of the quadrilateral plate of the right acetabulum. 3. Mild muscle edema in the right gluteus minimus and medius muscles likely reflecting mild muscle strain.  MRI brain  IMPRESSION: No evidence of intracranial metastatic disease.   Indeterminate 1 cm left frontal calvarium lesion.   12/18/2021 - 08/27/2022 Chemotherapy   Patient is on Treatment Plan : LUNG Carboplatin (5) + Pemetrexed (500) + Pembrolizumab (200) D1 q21d Induction x 4 cycles / Maintenance Pemetrexed (500) + Pembrolizumab (200) D1 q21d     10/07/2022 - 01/21/2023 Chemotherapy   Patient is on Treatment Plan : LUNG Carboplatin + Paclitaxel q21d Dose Reduction     03/27/2023 - 04/24/2023 Chemotherapy   Patient is on Treatment Plan : LUNG Bevacizumab q21d     06/26/2023 -  Chemotherapy   Patient is on Treatment Plan : LUNG Gemcitabine (1000) D1,8 q21d     Primary malignant neoplasm of left upper lobe of lung (HCC)  06/04/2022 Initial Diagnosis   Primary malignant neoplasm of left upper lobe of lung (HCC)   06/04/2022 Cancer Staging   Staging form: Lung, AJCC 8th Edition - Pathologic: Stage IVB (pT1c, pNX, cM1c) - Signed by Earna Coder, MD on 06/04/2022      PHYSICAL EXAMINATION: ECOG PERFORMANCE STATUS: 2 - Symptomatic, <50% confined to bed  Vitals:   06/26/23 1000 06/26/23 1011  BP: (!) 138/116 (!) 141/80  Pulse: (!) 121 (!) 115  Resp: 16   Temp: (!) 97.3 F (36.3 C)   SpO2: 100%     Filed Weights   06/26/23 1000  Weight: 129 lb (58.5 kg)      Physical Exam Constitutional:      Appearance: Normal appearance.  HENT:     Head: Normocephalic and atraumatic.  Cardiovascular:     Rate and Rhythm: Normal rate.  Pulmonary:     Effort: Pulmonary effort is normal.  Musculoskeletal:     Right lower leg: No edema.     Left lower leg: No edema.  Skin:    General: Skin is warm.  Neurological:     General: No focal deficit present.     Mental Status: She is oriented to person, place, and time.  Psychiatric:        Mood and Affect: Mood normal.     LABORATORY DATA:  I have reviewed the data as listed    Component Value Date/Time   NA 138 06/26/2023 0947   K 3.8 06/26/2023 0947   CL 112 (H) 06/26/2023 0947   CO2 20 (L) 06/26/2023 0947   GLUCOSE 130 (H) 06/26/2023 0947   BUN 25 (H) 06/26/2023 0947   CREATININE 1.18 (H) 06/26/2023 0947   CALCIUM 8.2 (L) 06/26/2023 0947   PROT 7.1 06/26/2023 0947   ALBUMIN 3.5 06/26/2023 0947   AST 23 06/26/2023 0947   ALT 16 06/26/2023 0947   ALKPHOS 156 (H) 06/26/2023 0947   BILITOT 0.5 06/26/2023 0947   GFRNONAA 49 (L) 06/26/2023 0947    No results found for: "SPEP", "UPEP"  Lab Results  Component Value Date   WBC 3.4 (L) 06/26/2023   NEUTROABS 2.4 06/26/2023   HGB 10.4 (L) 06/26/2023   HCT 31.7 (L) 06/26/2023   MCV 98.8 06/26/2023   PLT 110 (L) 06/26/2023      Chemistry      Component  Value Date/Time   NA 138 06/26/2023 0947   K 3.8 06/26/2023 0947   CL 112 (H) 06/26/2023 0947   CO2 20 (L) 06/26/2023 0947   BUN 25 (H) 06/26/2023 0947   CREATININE 1.18 (H) 06/26/2023 0947      Component Value Date/Time   CALCIUM 8.2 (L) 06/26/2023 0947   ALKPHOS 156 (H)  06/26/2023 0947   AST 23 06/26/2023 0947   ALT 16 06/26/2023 0947   BILITOT 0.5 06/26/2023 0947       RADIOGRAPHIC STUDIES: I have personally reviewed the radiological images as listed and agreed with the findings in the report. CT ABDOMEN PELVIS W CONTRAST Result Date: 06/02/2023 CLINICAL DATA:  Stage IV lung adenocarcinoma with metastatic disease. Abdominal pain. * Tracking Code: BO * EXAM: CT ABDOMEN AND PELVIS WITH CONTRAST TECHNIQUE: Multidetector CT imaging of the abdomen and pelvis was performed using the standard protocol following bolus administration of intravenous contrast. RADIATION DOSE REDUCTION: This exam was performed according to the departmental dose-optimization program which includes automated exposure control, adjustment of the mA and/or kV according to patient size and/or use of iterative reconstruction technique. CONTRAST:  OMNIPAQUE IOHEXOL 300 MG/ML  SOLN COMPARISON:  X-ray 05/30/2023. PET-CT 05/16/2023. Standard CT without contrast 02/24/2023. FINDINGS: Lower chest: Breathing motion. There is some linear opacity at the bases likely scar or atelectasis. No pleural effusion. Stable tiny nodules along the course of the major fissure. Small hiatal hernia. Hepatobiliary: Gallstones in the nondilated gallbladder. Patent portal vein. No space-occupying liver lesion. Pancreas: Unremarkable. No pancreatic ductal dilatation or surrounding inflammatory changes. Spleen: Normal in size without focal abnormality. Adrenals/Urinary Tract: Adrenal glands are preserved. Mild bilateral renal atrophy. Cystic lesion in the right kidney measures 2.3 cm with Hounsfield density 15. Bosniak 1 lesion. No imaging follow-up. No collecting system dilatation. Ureters have normal course and caliber extending down to the urinary bladder. Preserved contour to the urinary bladder. Stomach/Bowel: Large bowel has a normal course and caliber. Moderate colonic stool. Redundant course to the transverse and  sigmoid colon. Small hiatal hernia. Stomach is nondilated with some fluid and air. Small bowel is nondilated. Level of gaseous distention of the colon is improved from the previous examination. Vascular/Lymphatic: Aortic atherosclerosis. No enlarged abdominal or pelvic lymph nodes. Reproductive: Uterus and bilateral adnexa are unremarkable. Other: Small fat containing umbilical hernia. No free air or free fluid. Musculoskeletal: Trace anterolisthesis of L4 on L5. Scattered degenerative changes. There is once again lytic and sclerotic bone lesions identified along the spine particularly at L1, L3, L4, L5 and T12. There is compression with Schmorl's node changes at the superior endplate of L3 which is increased from previous. There is also soft tissue bulging towards the central canal related to the neoplasm. Please correlate with any symptoms. The deformity along the superior endplate could represent component of pathologic compression fracture. Extensive sclerotic changes as well along the pelvis and femurs. Streak artifact from the rod along the left femur. IMPRESSION: Nonspecific bowel gas pattern with moderate stool. No bowel obstruction. In the interim since the prior x-ray the level of gaseous distention of the bowel is improving. Mixed lucent and sclerotic bone metastases again identified. The associated compression of the superior endplate of L3 appears increased from the prior CT February 24, 2023. And from PET-CT scan of 05/16/2023. Please correlate with any change in symptoms. There is some canal encroachment as well related to bony deformity and abnormal soft tissue. If needed further workup with MRI can be performed as clinically  appropriate. Gallstones. Electronically Signed   By: Karen Kays M.D.   On: 06/02/2023 14:45   DG Abd 2 Views Result Date: 05/30/2023 CLINICAL DATA:  rule out bowel obstruction EXAM: ABDOMEN - 2 VIEW COMPARISON:  None Available. FINDINGS: Gaseous dilatation of the transverse  colon. Increased stool burden within the cecum. There is no evidence of free air. No radio-opaque calculi or other significant radiographic abnormality is seen. IMPRESSION: Gaseous dilatation of the transverse colon. Underlying large bowel obstruction or ileus not excluded. Recommend CT abdomen pelvis with intravenous contrast for further evaluation. Electronically Signed   By: Tish Frederickson M.D.   On: 05/30/2023 20:24

## 2023-06-26 NOTE — Patient Instructions (Signed)

## 2023-06-26 NOTE — Progress Notes (Signed)
 Patient denies new or acute problems/concerns today.  BP elevated onfirst office check with better reading on second check and states she did not take bp medications last night.  Heart rate is elevated.

## 2023-06-27 ENCOUNTER — Encounter: Payer: Self-pay | Admitting: Pulmonary Disease

## 2023-07-03 ENCOUNTER — Encounter: Payer: Self-pay | Admitting: Internal Medicine

## 2023-07-03 ENCOUNTER — Inpatient Hospital Stay

## 2023-07-03 ENCOUNTER — Inpatient Hospital Stay: Payer: PPO | Admitting: Internal Medicine

## 2023-07-03 ENCOUNTER — Inpatient Hospital Stay: Payer: PPO

## 2023-07-03 ENCOUNTER — Other Ambulatory Visit: Payer: Self-pay

## 2023-07-03 VITALS — BP 132/92 | HR 107 | Temp 98.2°F | Resp 20 | Ht 59.0 in | Wt 130.0 lb

## 2023-07-03 DIAGNOSIS — D701 Agranulocytosis secondary to cancer chemotherapy: Secondary | ICD-10-CM | POA: Diagnosis not present

## 2023-07-03 DIAGNOSIS — C3490 Malignant neoplasm of unspecified part of unspecified bronchus or lung: Secondary | ICD-10-CM | POA: Diagnosis not present

## 2023-07-03 DIAGNOSIS — T451X5A Adverse effect of antineoplastic and immunosuppressive drugs, initial encounter: Secondary | ICD-10-CM

## 2023-07-03 DIAGNOSIS — Z5111 Encounter for antineoplastic chemotherapy: Secondary | ICD-10-CM

## 2023-07-03 DIAGNOSIS — Z95828 Presence of other vascular implants and grafts: Secondary | ICD-10-CM

## 2023-07-03 LAB — CMP (CANCER CENTER ONLY)
ALT: 14 U/L (ref 0–44)
AST: 19 U/L (ref 15–41)
Albumin: 3.4 g/dL — ABNORMAL LOW (ref 3.5–5.0)
Alkaline Phosphatase: 152 U/L — ABNORMAL HIGH (ref 38–126)
Anion gap: 6 (ref 5–15)
BUN: 27 mg/dL — ABNORMAL HIGH (ref 8–23)
CO2: 22 mmol/L (ref 22–32)
Calcium: 8.8 mg/dL — ABNORMAL LOW (ref 8.9–10.3)
Chloride: 108 mmol/L (ref 98–111)
Creatinine: 1.28 mg/dL — ABNORMAL HIGH (ref 0.44–1.00)
GFR, Estimated: 45 mL/min — ABNORMAL LOW (ref >60)
Glucose, Bld: 108 mg/dL — ABNORMAL HIGH (ref 70–99)
Potassium: 3.7 mmol/L (ref 3.5–5.1)
Sodium: 136 mmol/L (ref 135–145)
Total Bilirubin: 0.6 mg/dL (ref 0.0–1.2)
Total Protein: 6.6 g/dL (ref 6.5–8.1)

## 2023-07-03 LAB — CBC WITH DIFFERENTIAL (CANCER CENTER ONLY)
Abs Immature Granulocytes: 0.01 10*3/uL (ref 0.00–0.07)
Basophils Absolute: 0 10*3/uL (ref 0.0–0.1)
Basophils Relative: 0 %
Eosinophils Absolute: 0.1 10*3/uL (ref 0.0–0.5)
Eosinophils Relative: 8 %
HCT: 27.5 % — ABNORMAL LOW (ref 36.0–46.0)
Hemoglobin: 9.2 g/dL — ABNORMAL LOW (ref 12.0–15.0)
Immature Granulocytes: 1 %
Lymphocytes Relative: 39 %
Lymphs Abs: 0.6 10*3/uL — ABNORMAL LOW (ref 0.7–4.0)
MCH: 32.7 pg (ref 26.0–34.0)
MCHC: 33.5 g/dL (ref 30.0–36.0)
MCV: 97.9 fL (ref 80.0–100.0)
Monocytes Absolute: 0.1 10*3/uL (ref 0.1–1.0)
Monocytes Relative: 6 %
Neutro Abs: 0.7 10*3/uL — ABNORMAL LOW (ref 1.7–7.7)
Neutrophils Relative %: 46 %
Platelet Count: 58 10*3/uL — ABNORMAL LOW (ref 150–400)
RBC: 2.81 MIL/uL — ABNORMAL LOW (ref 3.87–5.11)
RDW: 12.6 % (ref 11.5–15.5)
WBC Count: 1.4 10*3/uL — ABNORMAL LOW (ref 4.0–10.5)
nRBC: 0 % (ref 0.0–0.2)

## 2023-07-03 MED ORDER — HEPARIN SOD (PORK) LOCK FLUSH 100 UNIT/ML IV SOLN
500.0000 [IU] | Freq: Once | INTRAVENOUS | Status: AC
  Administered 2023-07-03: 500 [IU]
  Filled 2023-07-03: qty 5

## 2023-07-03 NOTE — Progress Notes (Signed)
 [facility] OFFICE PROGRESS NOTE  Patient Care Team: Dana Allan, MD as PCP - General (Family Medicine) Glory Buff, RN as Oncology Nurse Navigator Michaelyn Barter, MD as Consulting Physician (Oncology) Sondra Come, MD as Consulting Physician (Urology)  TREATMENT:  Right hip palliative RT 30 cGy completed 12/18/2021 Carboplatin, alimta and Keytruda x 4 cycles completed 02/19/22.  Discontinued maintenance alimta (04-23-22) due to worsening renal dysfunction, transaminitis and cytopenias Keytruda maintenance-discontinued on 09/17/2022 due to disease progression Palliative RT to left frontal lesion 5 fx completed 09/27/22 Palliative RT to left hip completed September 2024 Carboplatin (AUC 4) and paclitaxel 150 mg/m2 - x 5 cycles ( 10/07/2022- 01/21/2023). Maintenance Avastin 15 mg/kg- 03/27/2023- 04/24/23 Gemcitabine 800 mg/m day 1 day 8, started on 06/26/2023  ASSESSMENT & PLAN:   # Primary lung adenocarcinoma (HCC), Stage IV, PDL1 1% -Progressed through first-line [place] Alimta, Keytruda and then [place] maintenance on 09/17/2022.  -s/p second line carbo AUC 4 and Taxol 150 mg/m on 10/07/2022.  Completed 5 cycles and then discontinued due to severe cytopenias.  Patient agreed for maintenance Avastin (was not added with chemo because patient was hesitant).  Progressed through maintenance Avastin.  Last dose 04/24/2023.  -Foundation 1 from August 2023 no targets.  Liquid foundation 1 CDX from June 2024 with no targetable mutation.  Repeat lung biopsy on 12/17/2022 sent for foundation 1 CDX testing was limited due to inadequate sample but no targets seen.  RNA fusion panel was sent on primary biopsy from August 2023.  It could not be run due to low tumor purity.  HER2 IHC 0.  C-Met amplification negative by FISH.  Lab report polysomy 7.  -PET from 05/16/2023 showed disease progression in the bones.  Continue care coordination with Dr. Kemper Durie at [facility] reviewed.  WBC 1.4 with  ANC 0.7.  Platelets 58.  Hold cycle 1 day 8 of gemcitabine 800 mg/m.  She will likely benefit from day 1 day 15 regimen for better tolerance.  I will get Auth for Zarxio and plan to administer on 3/14 and 3/17 for faster recovery of neutropenia.  # Constipation -Continue with Senokot, MiraLAX as needed.  # Cancer-related pain -Follows with palliative care. -Xtampza 9 mg twice daily with oxycodone IR 5 mg every 6 as needed -Flexeril 5 mg twice daily as needed for muscle stiffness -After spine RT, patient has completely stopped pain medication.  Reports her pain is manageable.  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   -Follow-up with Dr. Barbaraann Cao.  Dr. Barbaraann Cao discussed the case with Dr. Rushie Chestnut who plans to hold off on radiation at this time.  Since the growth is minimal and patient is asymptomatic. -Follow-up MRI brain scheduled in April 2025.  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 Cr stabilized.  -will continue to monitor.  # Chemotherapy-induced anemia  -Aranesp 300 mcg as needed.   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 Aggie Cosier on 12/18/2021 -Completed palliative RT to left hip on 01/01/2023.   -s/p palliative RT to lumbar spine completed on 06/12/2023  # Metastatic bone lesions -Xgeva 120 mg subcu monthly (last dose on 05/26/2023) -Plan is for q1 to 3 months depending on the calcium levels.  # Poor appetite # Weight loss -Continue follow-up with nutrition. -Did not tolerate olanzapine. did not make her feel right.  Has declined other appetite stimulants  #  Limited mobility # Risk of fall -Referral to home health PT because patient has limited mobility from metastatic lung cancer to the bone causing pathological fractures, pain and she is at high risk of fall.  # Access - port  RTC in 1 week for MD visit, labs,  Gemzar, Xgeva  Orders Placed This Encounter  Procedures   CBC with Differential (Cancer Center Only)    Standing Status:   Future    Expected Date:   07/10/2023    Expiration Date:   07/09/2024   CMP (Cancer Center only)    Standing Status:   Future    Expected Date:   07/10/2023    Expiration Date:   07/09/2024    [facility]- 12/06/2021   All questions were answered. The patient knows to call the clinic with any problems, questions or concerns. The total time spent in the appointment was 30 minutes encounter with patients including review of chart and various tests results, discussions about plan of care and coordination of care plan   Michaelyn Barter, MD 07/03/2023 9:51 AM  INTERVAL HISTORY: Patient seen today as follow-up accompanied with daughter for management of stage IV lung adenocarcinoma. She tolerated chemotherapy well.  Denies any nausea or vomiting.  Denies any constipation.  She is using long-acting Xtampza.  Has not required short acting.  Mobility has improved after completion of radiation.  Appetite is fair.  Had some itching on her torso on Sunday.  No visible rash.  Improved with antihistamines.   REVIEW OF SYSTEMS:   Positive ROS as above.  Rest 10 points ROS negative   I have reviewed the past medical history, past surgical history, social history and family history with the patient and they are unchanged from previous note.  ALLERGIES:  is allergic to diclofenac, lisinopril, and sulfa antibiotics.  MEDICATIONS:  Current Outpatient Medications  Medication Sig Dispense Refill   albuterol (VENTOLIN HFA) 108 (90 Base) MCG/ACT inhaler Inhale 2 puffs into the lungs every 6 (six) hours as needed for wheezing or shortness of breath. 18 g 0   Azelastine HCl 137 MCG/SPRAY SOLN Place 2 sprays into both nostrils 2 (two) times daily.     Cholecalciferol (VITAMIN D-3 PO) Take by mouth.     cyclobenzaprine (FLEXERIL) 5 MG tablet Take 1 tablet (5 mg total) by mouth 2 (two)  times daily as needed for muscle spasms. 30 tablet 0   fluticasone (FLONASE) 50 MCG/ACT nasal spray USE TWO SPRAYS IN EACH NOSTRIL ONCE DAILY prn 16 g 12   loratadine (CLARITIN) 10 MG tablet TAKE ONE TABLET BY MOUTH DAILY AS NEEDED FOR ALLERGIES 90 tablet 3   losartan (COZAAR) 100 MG tablet Take 1 tablet (100 mg total) by mouth daily. 90 tablet 3   OLANZapine (ZYPREXA) 5 MG tablet Take 1 tablet (5 mg total) by mouth at bedtime. For appetite 30 tablet 3   ondansetron (ZOFRAN) 8 MG tablet Take 1 tablet (8 mg total) by mouth every 8 (eight) hours as needed for nausea or vomiting. 30 tablet 3   oxyCODONE ER (XTAMPZA ER) 9 MG C12A Take 1 capsule by mouth every 12 (twelve) hours. 60 capsule 0   senna (SENOKOT) 8.6 MG TABS tablet Take 1 tablet by mouth daily.     lactulose (CHRONULAC) 10 GM/15ML solution Take 15 mLs (10 g total) by mouth 3 (three) times daily. (Patient not taking: Reported on 07/03/2023) 236 mL 0   magic mouthwash w/lidocaine SOLN Take 5 mLs by mouth 4 (four)  times daily as needed for mouth pain. Sig: Swish/Swallow 5-10 ml four times a day as needed. Dispense 480 ml. 1RF (Patient not taking: Reported on 07/03/2023) 480 mL 1   naloxone (NARCAN) nasal spray 4 mg/0.1 mL SPRAY 1 SPRAY INTO ONE NOSTRIL AS DIRECTED FOR OPIOID OVERDOSE (TURN PERSON ON SIDE AFTER DOSE. IF NO RESPONSE IN 2-3 MINUTES OR PERSON RESPONDS BUT RELAPSES, REPEAT USING A NEW SPRAY DEVICE AND SPRAY INTO THE OTHER NOSTRIL. CALL 911 AFTER USE.) * EMERGENCY USE ONLY * (Patient not taking: Reported on 07/03/2023) 1 each 0   oxyCODONE (OXY IR/ROXICODONE) 5 MG immediate release tablet Take 1-2 tablets (5-10 mg total) by mouth every 4 (four) hours as needed for severe pain (pain score 7-10). (Patient not taking: Reported on 07/03/2023) 60 tablet 0   No current facility-administered medications for this visit.   Facility-Administered Medications Ordered in Other Visits  Medication Dose Route Frequency Provider Last Rate Last Admin    heparin lock flush 100 UNIT/ML injection            heparin lock flush 100 unit/mL  500 Units Intravenous Once Michaelyn Barter, MD       heparin lock flush 100 unit/mL  500 Units Intracatheter Once Michaelyn Barter, MD       sodium chloride flush (NS) 0.9 % injection 10 mL  10 mL Intravenous Once Michaelyn Barter, MD        SUMMARY OF ONCOLOGIC HISTORY: Oncology History  Primary lung adenocarcinoma (HCC)  10/10/2021 Initial Diagnosis   Primary lung adenocarcinoma (HCC) Stage IV  Patient seen by PCP for wheezing for 2 weeks. Imaging with CXR followed by CT chest showed Spiculated 2.1 x 2.2 cm left upper lobe pulmonary nodule with associated pleural tethering. Innumerable subcentimeter pulmonary nodules throughout the lungs.      11/14/2021 PET scan   IMPRESSION: 1. Left suprahilar nodule with maximum SUV 4.5, and a more distal 2.2 cm left upper lobe nodule with maximum SUV of 5.5. 2. Innumerable scattered small pulmonary nodules, most of which are under 5 mm in diameter, throughout both lungs. Some are cavitary. Possibilities include cavitating hematogenous disseminated malignancy versus infectious etiology subset as septic emboli. Most of the nodules are stable, some are minimally enlarged compared to 10/10/2021. 3. Abnormal mild permeative type bony findings in the right hemipelvis associated with substantially accentuated metabolic activity. Possible associated pathologic fracture through the right quadrilateral plate and right acetabulum. The findings involve the right ilium and ischium but also the right lateral sacral ala and a small focus in the left sacrum.    Pathology Results   She had transbronchial biopsies by Dr. Jayme Cloud on 11/19/21.  Pathology from LUL nodule showed adenocarcinoma, consistent with lung primary.    11/19/2021 Cancer Staging   Staging form: Lung, AJCC 8th Edition - Clinical stage from 11/19/2021: Stage IVB (cT1c, cM1c) - Signed by Michaelyn Barter, MD on  11/27/2021 Stage prefix: Initial diagnosis   11/27/2021 Imaging   MRI pelvis  IMPRESSION: 1. Abnormal marrow signal throughout the right ilium involving the right acetabulum, right superior pubic ramus and right inferior pubic ramus, right side of the sacrum and a smaller focus of abnormal marrow lesion in the left side of the sacrum. Findings are concerning for metastatic disease. 2. Nondisplaced pathologic fracture of the quadrilateral plate of the right acetabulum. 3. Mild muscle edema in the right gluteus minimus and medius muscles likely reflecting mild muscle strain.  MRI brain  IMPRESSION: No evidence of intracranial metastatic disease.  Indeterminate 1 cm left frontal calvarium lesion.   12/18/2021 - 08/27/2022 Chemotherapy   Patient is on Treatment Plan : LUNG Carboplatin (5) + Pemetrexed (500) + Pembrolizumab (200) D1 q21d Induction x 4 cycles / Maintenance Pemetrexed (500) + Pembrolizumab (200) D1 q21d     10/07/2022 - 01/21/2023 Chemotherapy   Patient is on Treatment Plan : LUNG Carboplatin + Paclitaxel q21d Dose Reduction     03/27/2023 - 04/24/2023 Chemotherapy   Patient is on Treatment Plan : LUNG Bevacizumab q21d     06/26/2023 -  Chemotherapy   Patient is on Treatment Plan : LUNG Gemcitabine (1000) D1,8 q21d     Primary malignant neoplasm of left upper lobe of lung (HCC)  06/04/2022 Initial Diagnosis   Primary malignant neoplasm of left upper lobe of lung (HCC)   06/04/2022 Cancer Staging   Staging form: Lung, AJCC 8th Edition - Pathologic: Stage IVB (pT1c, pNX, cM1c) - Signed by Earna Coder, MD on 06/04/2022     PHYSICAL EXAMINATION: ECOG PERFORMANCE STATUS: 2 - Symptomatic, <50% confined to bed  Vitals:   07/03/23 0907  BP: (!) 132/92  Pulse: (!) 107  Resp: 20  Temp: 98.2 F (36.8 C)    Filed Weights   07/03/23 0907  Weight: 130 lb (59 kg)      Physical Exam Constitutional:      Appearance: Normal appearance.  HENT:     Head:  Normocephalic and atraumatic.  Cardiovascular:     Rate and Rhythm: Normal rate.  Pulmonary:     Effort: Pulmonary effort is normal.  Musculoskeletal:     Right lower leg: No edema.     Left lower leg: No edema.  Skin:    General: Skin is warm.  Neurological:     General: No focal deficit present.     Mental Status: She is oriented to person, place, and time.  Psychiatric:        Mood and Affect: Mood normal.     LABORATORY DATA:  I have reviewed the data as listed    Component Value Date/Time   NA 136 07/03/2023 0851   K 3.7 07/03/2023 0851   CL 108 07/03/2023 0851   CO2 22 07/03/2023 0851   GLUCOSE 108 (H) 07/03/2023 0851   BUN 27 (H) 07/03/2023 0851   CREATININE 1.28 (H) 07/03/2023 0851   CALCIUM 8.8 (L) 07/03/2023 0851   PROT 6.6 07/03/2023 0851   ALBUMIN 3.4 (L) 07/03/2023 0851   AST 19 07/03/2023 0851   ALT 14 07/03/2023 0851   ALKPHOS 152 (H) 07/03/2023 0851   BILITOT 0.6 07/03/2023 0851   GFRNONAA 45 (L) 07/03/2023 0851    No results found for: "SPEP", "UPEP"  Lab Results  Component Value Date   WBC 1.4 (L) 07/03/2023   NEUTROABS 0.7 (L) 07/03/2023   HGB 9.2 (L) 07/03/2023   HCT 27.5 (L) 07/03/2023   MCV 97.9 07/03/2023   PLT 58 (L) 07/03/2023      Chemistry      Component Value Date/Time   NA 136 07/03/2023 0851   K 3.7 07/03/2023 0851   CL 108 07/03/2023 0851   CO2 22 07/03/2023 0851   BUN 27 (H) 07/03/2023 0851   CREATININE 1.28 (H) 07/03/2023 0851      Component Value Date/Time   CALCIUM 8.8 (L) 07/03/2023 0851   ALKPHOS 152 (H) 07/03/2023 0851   AST 19 07/03/2023 0851   ALT 14 07/03/2023 0851   BILITOT 0.6 07/03/2023 2841  RADIOGRAPHIC STUDIES: I have personally reviewed the radiological images as listed and agreed with the findings in the report. No results found.

## 2023-07-03 NOTE — Progress Notes (Signed)
 Nutrition Follow-up:   RD scheduled to see patient during infusion today but infusion finished before RD able to see patient.  Will follow-up at next visit   Gentry Pilson B. Freida Busman, RD, LDN Registered Dietitian [phone]

## 2023-07-04 ENCOUNTER — Inpatient Hospital Stay

## 2023-07-04 DIAGNOSIS — Z5111 Encounter for antineoplastic chemotherapy: Secondary | ICD-10-CM | POA: Diagnosis not present

## 2023-07-04 DIAGNOSIS — C3490 Malignant neoplasm of unspecified part of unspecified bronchus or lung: Secondary | ICD-10-CM

## 2023-07-04 MED ORDER — FILGRASTIM-SNDZ 480 MCG/0.8ML IJ SOSY
480.0000 ug | PREFILLED_SYRINGE | Freq: Once | INTRAMUSCULAR | Status: AC
  Administered 2023-07-04: 480 ug via SUBCUTANEOUS
  Filled 2023-07-04: qty 0.8

## 2023-07-07 ENCOUNTER — Inpatient Hospital Stay

## 2023-07-07 DIAGNOSIS — C3490 Malignant neoplasm of unspecified part of unspecified bronchus or lung: Secondary | ICD-10-CM

## 2023-07-07 DIAGNOSIS — Z5111 Encounter for antineoplastic chemotherapy: Secondary | ICD-10-CM | POA: Diagnosis not present

## 2023-07-07 MED ORDER — FILGRASTIM-SNDZ 480 MCG/0.8ML IJ SOSY
480.0000 ug | PREFILLED_SYRINGE | Freq: Once | INTRAMUSCULAR | Status: AC
  Administered 2023-07-07: 480 ug via SUBCUTANEOUS
  Filled 2023-07-07: qty 0.8

## 2023-07-10 ENCOUNTER — Inpatient Hospital Stay

## 2023-07-10 ENCOUNTER — Inpatient Hospital Stay (HOSPITAL_BASED_OUTPATIENT_CLINIC_OR_DEPARTMENT_OTHER): Admitting: Internal Medicine

## 2023-07-10 ENCOUNTER — Encounter: Payer: Self-pay | Admitting: Internal Medicine

## 2023-07-10 VITALS — BP 122/78 | HR 103 | Temp 98.8°F | Resp 16 | Wt 131.0 lb

## 2023-07-10 DIAGNOSIS — C3490 Malignant neoplasm of unspecified part of unspecified bronchus or lung: Secondary | ICD-10-CM

## 2023-07-10 DIAGNOSIS — D6959 Other secondary thrombocytopenia: Secondary | ICD-10-CM | POA: Diagnosis not present

## 2023-07-10 DIAGNOSIS — Z95828 Presence of other vascular implants and grafts: Secondary | ICD-10-CM

## 2023-07-10 DIAGNOSIS — Z5111 Encounter for antineoplastic chemotherapy: Secondary | ICD-10-CM

## 2023-07-10 DIAGNOSIS — T451X5A Adverse effect of antineoplastic and immunosuppressive drugs, initial encounter: Secondary | ICD-10-CM

## 2023-07-10 LAB — CMP (CANCER CENTER ONLY)
ALT: 15 U/L (ref 0–44)
AST: 22 U/L (ref 15–41)
Albumin: 3.5 g/dL (ref 3.5–5.0)
Alkaline Phosphatase: 198 U/L — ABNORMAL HIGH (ref 38–126)
Anion gap: 7 (ref 5–15)
BUN: 16 mg/dL (ref 8–23)
CO2: 22 mmol/L (ref 22–32)
Calcium: 8.5 mg/dL — ABNORMAL LOW (ref 8.9–10.3)
Chloride: 106 mmol/L (ref 98–111)
Creatinine: 1.16 mg/dL — ABNORMAL HIGH (ref 0.44–1.00)
GFR, Estimated: 50 mL/min — ABNORMAL LOW (ref >60)
Glucose, Bld: 116 mg/dL — ABNORMAL HIGH (ref 70–99)
Potassium: 4 mmol/L (ref 3.5–5.1)
Sodium: 135 mmol/L (ref 135–145)
Total Bilirubin: 0.5 mg/dL (ref 0.0–1.2)
Total Protein: 6.6 g/dL (ref 6.5–8.1)

## 2023-07-10 LAB — CBC WITH DIFFERENTIAL (CANCER CENTER ONLY)
Abs Immature Granulocytes: 0.3 10*3/uL — ABNORMAL HIGH (ref 0.00–0.07)
Basophils Absolute: 0 10*3/uL (ref 0.0–0.1)
Basophils Relative: 1 %
Eosinophils Absolute: 0.1 10*3/uL (ref 0.0–0.5)
Eosinophils Relative: 1 %
HCT: 28.7 % — ABNORMAL LOW (ref 36.0–46.0)
Hemoglobin: 9.3 g/dL — ABNORMAL LOW (ref 12.0–15.0)
Immature Granulocytes: 4 %
Lymphocytes Relative: 15 %
Lymphs Abs: 1 10*3/uL (ref 0.7–4.0)
MCH: 33 pg (ref 26.0–34.0)
MCHC: 32.4 g/dL (ref 30.0–36.0)
MCV: 101.8 fL — ABNORMAL HIGH (ref 80.0–100.0)
Monocytes Absolute: 0.5 10*3/uL (ref 0.1–1.0)
Monocytes Relative: 7 %
Neutro Abs: 4.9 10*3/uL (ref 1.7–7.7)
Neutrophils Relative %: 72 %
Platelet Count: 56 10*3/uL — ABNORMAL LOW (ref 150–400)
RBC: 2.82 MIL/uL — ABNORMAL LOW (ref 3.87–5.11)
RDW: 14.2 % (ref 11.5–15.5)
WBC Count: 6.8 10*3/uL (ref 4.0–10.5)
nRBC: 0.7 % — ABNORMAL HIGH (ref 0.0–0.2)

## 2023-07-10 MED ORDER — HEPARIN SOD (PORK) LOCK FLUSH 100 UNIT/ML IV SOLN
500.0000 [IU] | Freq: Once | INTRAVENOUS | Status: AC
  Administered 2023-07-10: 500 [IU]
  Filled 2023-07-10: qty 5

## 2023-07-10 MED ORDER — XTAMPZA ER 9 MG PO C12A: 1.0000 | EXTENDED_RELEASE_CAPSULE | Freq: Two times a day (BID) | ORAL | 0 refills | Status: DC

## 2023-07-10 NOTE — Progress Notes (Signed)
 Patient is doing about the same, she is still having some stiffness. She said that her appetite has got better. She is scheduled to have a MRI on 07/25/2023, from Dr. Barbaraann Cao.

## 2023-07-10 NOTE — Patient Instructions (Signed)

## 2023-07-10 NOTE — Progress Notes (Signed)
 Nutrition Follow-up:  Patient with stage IV lung cancer.  Patient chemotherapy on hold today due to low platelets.  Met with patient following MD visit. Reports that appetite is a little bit better.  Gained one pound today.  Yesterday able to eat egg, english muffin and grapes.  Sometimes has bagel for breakfast.Lunch was salad with beef and supper was soup with crackers and bowl of ice cream.  Reports at times stomach upset from eating something greasy.  Has nausea medication on hand    Medications: reviewed  Labs: reviewed  Anthropometrics:   Weight 131 lb, increased  122 lb 3.2 oz on 2/24 134 lb on 2/10 137 lb on 7/14 136 lb on 10/1 139 lb on 8/9 129 lb on 7/29   NUTRITION DIAGNOSIS: Unintentional weight loss continues   INTERVENTION:  Encouraged patient to add cream cheese on bagel for added calories and protein Encouraged continued high calorie, high protein foods to help keep weight up    MONITORING, EVALUATION, GOAL: weight trends, intake   NEXT VISIT: as needed  Alyviah Crandle B. Elease Hashimoto, CSO, LDN Registered Dietitian [phone]

## 2023-07-10 NOTE — Progress Notes (Signed)
 [facility] OFFICE PROGRESS NOTE  Patient Care Team: Dana Allan, MD as PCP - General (Family Medicine) Glory Buff, RN as Oncology Nurse Navigator Michaelyn Barter, MD as Consulting Physician (Oncology) Sondra Come, MD as Consulting Physician (Urology)  TREATMENT:  Right hip palliative RT 30 cGy completed 12/18/2021 Carboplatin, alimta and Keytruda x 4 cycles completed 02/19/22.  Discontinued maintenance alimta (04-23-22) due to worsening renal dysfunction, transaminitis and cytopenias Keytruda maintenance-discontinued on 09/17/2022 due to disease progression Palliative RT to left frontal lesion 5 fx completed 09/27/22 Palliative RT to left hip completed September 2024 Carboplatin (AUC 4) and paclitaxel 150 mg/m2 - x 5 cycles ( 10/07/2022- 01/21/2023). Maintenance Avastin 15 mg/kg- 03/27/2023- 04/24/23 Gemcitabine 800 mg/m day 1 day 8, started on 06/26/2023  ASSESSMENT & PLAN:   # Primary lung adenocarcinoma (HCC), Stage IV, PDL1 1% -Progressed through first-line [place] Alimta, Keytruda and then [place] maintenance on 09/17/2022.  -s/p second line carbo AUC 4 and Taxol 150 mg/m on 10/07/2022.  Completed 5 cycles and then discontinued due to severe cytopenias.  Patient agreed for maintenance Avastin (was not added with chemo because patient was hesitant).  Progressed through maintenance Avastin.  Last dose 04/24/2023.  -Foundation 1 from August 2023 no targets.  Liquid foundation 1 CDX from June 2024 with no targetable mutation.  Repeat lung biopsy on 12/17/2022 sent for foundation 1 CDX testing was limited due to inadequate sample but no targets seen.  RNA fusion panel was sent on primary biopsy from August 2023.  It could not be run due to low tumor purity.  HER2 IHC 0.  C-Met amplification negative by FISH.  Lab report polysomy 7.  -PET from 05/16/2023 showed disease progression in the bones.  Continue care coordination with Dr. Kemper Durie at [facility].   -s/p C1D1 of gemcitabine 800  mg/m on 06/26/2023.  ANC has recovered after 2 doses of Zarxio.  However her platelet recovery is slow.  Platelets are still low at 56 today.  No treatment today.  Deaccessed port.  I discussed about switching gemcitabine regimen from day 1, 8 to every 2-week with further dose reduction to 650 mg/m. Added Neulasta on pro 6 mg to the treatment plan with the next cycle.  Discussed about a rare consideration of thrombopoietin agonist such as Nplate if we are still dealing with thrombocytopenia.  Patient prefers to have all this information discussed with Dr. Kemper Durie at [facility].  I have sent an email to him and will update Consuello Masse once I hear back from him.    # Constipation -Continue with Senokot, MiraLAX as needed.  # Cancer-related pain -Follows with palliative care. -Xtampza 9 mg twice daily with oxycodone IR 5 mg every 6 as needed -Flexeril 5 mg twice daily as needed for muscle stiffness -After spine RT, patient has completely stopped pain medication.  Reports her pain is manageable.  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   -Follow-up with Dr. Barbaraann Cao.  Dr. Barbaraann Cao discussed the case with Dr. Rushie Chestnut who plans to hold off on radiation at this time.  Since the growth is minimal and patient is asymptomatic. -Follow-up MRI brain scheduled in April 2025.  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 Cr stabilized.  -will continue to monitor.  # Chemotherapy-induced anemia  -Aranesp 300 mcg as needed.   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 Aggie Cosier on  12/18/2021 -Completed palliative RT to left hip on 01/01/2023.   -s/p palliative RT to lumbar spine completed on 06/12/2023  # Metastatic bone lesions -Xgeva 120 mg subcu monthly (last dose on 05/26/2023) -Plan is for q1 to 3 months depending on the calcium levels.  # Poor appetite # Weight  loss -Continue follow-up with nutrition. -Did not tolerate olanzapine. did not make her feel right.  Has declined other appetite stimulants  # Limited mobility # Risk of fall -Referral to home health PT because patient has limited mobility from metastatic lung cancer to the bone causing pathological fractures, pain and she is at high risk of fall.  # Access - port  RTC in 1 week for MD visit, labs, Gemzar, Xgeva  Orders Placed This Encounter  Procedures   CBC with Differential (Cancer Center Only)    Standing Status:   Future    Expected Date:   07/17/2023    Expiration Date:   07/16/2024   CMP (Cancer Center only)    Standing Status:   Future    Expected Date:   07/17/2023    Expiration Date:   07/16/2024    [facility]- 12/06/2021   All questions were answered. The patient knows to call the clinic with any problems, questions or concerns. The total time spent in the appointment was 30 minutes encounter with patients including review of chart and various tests results, discussions about plan of care and coordination of care plan   Michaelyn Barter, MD 07/10/2023 2:34 PM  INTERVAL HISTORY: Patient seen today as follow-up accompanied with daughter for management of stage IV lung adenocarcinoma. She tolerated chemotherapy well.  Denies any nausea or vomiting.  Denies any constipation.  She is using long-acting Xtampza.  Has not required short acting.  Mobility has improved after completion of radiation.  Appetite is fair.  Had some itching on her torso on Sunday.  No visible rash.  Improved with antihistamines.   REVIEW OF SYSTEMS:   Positive ROS as above.  Rest 10 points ROS negative   I have reviewed the past medical history, past surgical history, social history and family history with the patient and they are unchanged from previous note.  ALLERGIES:  is allergic to diclofenac, lisinopril, and sulfa antibiotics.  MEDICATIONS:  Current Outpatient Medications  Medication  Sig Dispense Refill   albuterol (VENTOLIN HFA) 108 (90 Base) MCG/ACT inhaler Inhale 2 puffs into the lungs every 6 (six) hours as needed for wheezing or shortness of breath. 18 g 0   Azelastine HCl 137 MCG/SPRAY SOLN Place 2 sprays into both nostrils 2 (two) times daily.     Cholecalciferol (VITAMIN D-3 PO) Take by mouth.     cyclobenzaprine (FLEXERIL) 5 MG tablet Take 1 tablet (5 mg total) by mouth 2 (two) times daily as needed for muscle spasms. 30 tablet 0   fluticasone (FLONASE) 50 MCG/ACT nasal spray USE TWO SPRAYS IN EACH NOSTRIL ONCE DAILY prn 16 g 12   lactulose (CHRONULAC) 10 GM/15ML solution Take 15 mLs (10 g total) by mouth 3 (three) times daily. (Patient not taking: Reported on 07/03/2023) 236 mL 0   loratadine (CLARITIN) 10 MG tablet TAKE ONE TABLET BY MOUTH DAILY AS NEEDED FOR ALLERGIES 90 tablet 3   losartan (COZAAR) 100 MG tablet Take 1 tablet (100 mg total) by mouth daily. 90 tablet 3   magic mouthwash w/lidocaine SOLN Take 5 mLs by mouth 4 (four) times daily as needed for mouth pain. Sig: Swish/Swallow 5-10 ml four  times a day as needed. Dispense 480 ml. 1RF (Patient not taking: Reported on 07/03/2023) 480 mL 1   naloxone (NARCAN) nasal spray 4 mg/0.1 mL SPRAY 1 SPRAY INTO ONE NOSTRIL AS DIRECTED FOR OPIOID OVERDOSE (TURN PERSON ON SIDE AFTER DOSE. IF NO RESPONSE IN 2-3 MINUTES OR PERSON RESPONDS BUT RELAPSES, REPEAT USING A NEW SPRAY DEVICE AND SPRAY INTO THE OTHER NOSTRIL. CALL 911 AFTER USE.) * EMERGENCY USE ONLY * (Patient not taking: Reported on 07/03/2023) 1 each 0   OLANZapine (ZYPREXA) 5 MG tablet Take 1 tablet (5 mg total) by mouth at bedtime. For appetite 30 tablet 3   ondansetron (ZOFRAN) 8 MG tablet Take 1 tablet (8 mg total) by mouth every 8 (eight) hours as needed for nausea or vomiting. 30 tablet 3   oxyCODONE (OXY IR/ROXICODONE) 5 MG immediate release tablet Take 1-2 tablets (5-10 mg total) by mouth every 4 (four) hours as needed for severe pain (pain score 7-10). (Patient  not taking: Reported on 07/03/2023) 60 tablet 0   oxyCODONE ER (XTAMPZA ER) 9 MG C12A Take 1 capsule by mouth every 12 (twelve) hours. 60 capsule 0   senna (SENOKOT) 8.6 MG TABS tablet Take 1 tablet by mouth daily.     No current facility-administered medications for this visit.   Facility-Administered Medications Ordered in Other Visits  Medication Dose Route Frequency Provider Last Rate Last Admin   heparin lock flush 100 UNIT/ML injection            heparin lock flush 100 unit/mL  500 Units Intravenous Once Michaelyn Barter, MD       sodium chloride flush (NS) 0.9 % injection 10 mL  10 mL Intravenous Once Michaelyn Barter, MD        SUMMARY OF ONCOLOGIC HISTORY: Oncology History  Primary lung adenocarcinoma (HCC)  10/10/2021 Initial Diagnosis   Primary lung adenocarcinoma (HCC) Stage IV  Patient seen by PCP for wheezing for 2 weeks. Imaging with CXR followed by CT chest showed Spiculated 2.1 x 2.2 cm left upper lobe pulmonary nodule with associated pleural tethering. Innumerable subcentimeter pulmonary nodules throughout the lungs.      11/14/2021 PET scan   IMPRESSION: 1. Left suprahilar nodule with maximum SUV 4.5, and a more distal 2.2 cm left upper lobe nodule with maximum SUV of 5.5. 2. Innumerable scattered small pulmonary nodules, most of which are under 5 mm in diameter, throughout both lungs. Some are cavitary. Possibilities include cavitating hematogenous disseminated malignancy versus infectious etiology subset as septic emboli. Most of the nodules are stable, some are minimally enlarged compared to 10/10/2021. 3. Abnormal mild permeative type bony findings in the right hemipelvis associated with substantially accentuated metabolic activity. Possible associated pathologic fracture through the right quadrilateral plate and right acetabulum. The findings involve the right ilium and ischium but also the right lateral sacral ala and a small focus in the left sacrum.     Pathology Results   She had transbronchial biopsies by Dr. Jayme Cloud on 11/19/21.  Pathology from LUL nodule showed adenocarcinoma, consistent with lung primary.    11/19/2021 Cancer Staging   Staging form: Lung, AJCC 8th Edition - Clinical stage from 11/19/2021: Stage IVB (cT1c, cM1c) - Signed by Michaelyn Barter, MD on 11/27/2021 Stage prefix: Initial diagnosis   11/27/2021 Imaging   MRI pelvis  IMPRESSION: 1. Abnormal marrow signal throughout the right ilium involving the right acetabulum, right superior pubic ramus and right inferior pubic ramus, right side of the sacrum and a smaller focus of  abnormal marrow lesion in the left side of the sacrum. Findings are concerning for metastatic disease. 2. Nondisplaced pathologic fracture of the quadrilateral plate of the right acetabulum. 3. Mild muscle edema in the right gluteus minimus and medius muscles likely reflecting mild muscle strain.  MRI brain  IMPRESSION: No evidence of intracranial metastatic disease.   Indeterminate 1 cm left frontal calvarium lesion.   12/18/2021 - 08/27/2022 Chemotherapy   Patient is on Treatment Plan : LUNG Carboplatin (5) + Pemetrexed (500) + Pembrolizumab (200) D1 q21d Induction x 4 cycles / Maintenance Pemetrexed (500) + Pembrolizumab (200) D1 q21d     10/07/2022 - 01/21/2023 Chemotherapy   Patient is on Treatment Plan : LUNG Carboplatin + Paclitaxel q21d Dose Reduction     03/27/2023 - 04/24/2023 Chemotherapy   Patient is on Treatment Plan : LUNG Bevacizumab q21d     06/26/2023 -  Chemotherapy   Patient is on Treatment Plan : LUNG Gemcitabine (1000) D1,8 q21d     Primary malignant neoplasm of left upper lobe of lung (HCC)  06/04/2022 Initial Diagnosis   Primary malignant neoplasm of left upper lobe of lung (HCC)   06/04/2022 Cancer Staging   Staging form: Lung, AJCC 8th Edition - Pathologic: Stage IVB (pT1c, pNX, cM1c) - Signed by Earna Coder, MD on 06/04/2022     PHYSICAL EXAMINATION: ECOG  PERFORMANCE STATUS: 2 - Symptomatic, <50% confined to bed  Vitals:   07/10/23 1304  BP: 122/78  Pulse: (!) 103  Resp: 16  Temp: 98.8 F (37.1 C)  SpO2: 100%    Filed Weights   07/10/23 1304  Weight: 131 lb (59.4 kg)      Physical Exam Constitutional:      Appearance: Normal appearance.  HENT:     Head: Normocephalic and atraumatic.  Cardiovascular:     Rate and Rhythm: Normal rate.  Pulmonary:     Effort: Pulmonary effort is normal.  Musculoskeletal:     Right lower leg: No edema.     Left lower leg: No edema.  Skin:    General: Skin is warm.  Neurological:     General: No focal deficit present.     Mental Status: She is oriented to person, place, and time.  Psychiatric:        Mood and Affect: Mood normal.     LABORATORY DATA:  I have reviewed the data as listed    Component Value Date/Time   NA 135 07/10/2023 1244   K 4.0 07/10/2023 1244   CL 106 07/10/2023 1244   CO2 22 07/10/2023 1244   GLUCOSE 116 (H) 07/10/2023 1244   BUN 16 07/10/2023 1244   CREATININE 1.16 (H) 07/10/2023 1244   CALCIUM 8.5 (L) 07/10/2023 1244   PROT 6.6 07/10/2023 1244   ALBUMIN 3.5 07/10/2023 1244   AST 22 07/10/2023 1244   ALT 15 07/10/2023 1244   ALKPHOS 198 (H) 07/10/2023 1244   BILITOT 0.5 07/10/2023 1244   GFRNONAA 50 (L) 07/10/2023 1244    No results found for: "SPEP", "UPEP"  Lab Results  Component Value Date   WBC 6.8 07/10/2023   NEUTROABS 4.9 07/10/2023   HGB 9.3 (L) 07/10/2023   HCT 28.7 (L) 07/10/2023   MCV 101.8 (H) 07/10/2023   PLT 56 (L) 07/10/2023      Chemistry      Component Value Date/Time   NA 135 07/10/2023 1244   K 4.0 07/10/2023 1244   CL 106 07/10/2023 1244   CO2 22 07/10/2023  1244   BUN 16 07/10/2023 1244   CREATININE 1.16 (H) 07/10/2023 1244      Component Value Date/Time   CALCIUM 8.5 (L) 07/10/2023 1244   ALKPHOS 198 (H) 07/10/2023 1244   AST 22 07/10/2023 1244   ALT 15 07/10/2023 1244   BILITOT 0.5 07/10/2023 1244        RADIOGRAPHIC STUDIES: I have personally reviewed the radiological images as listed and agreed with the findings in the report. No results found.

## 2023-07-10 NOTE — Progress Notes (Signed)
 Holding gemzar and xgeva today. Port d/c

## 2023-07-14 ENCOUNTER — Encounter: Payer: Self-pay | Admitting: Internal Medicine

## 2023-07-14 NOTE — Addendum Note (Signed)
 Addended byMichaelyn Barter on: 07/14/2023 10:35 AM   Modules accepted: Orders

## 2023-07-17 ENCOUNTER — Inpatient Hospital Stay

## 2023-07-17 ENCOUNTER — Inpatient Hospital Stay: Admitting: Nurse Practitioner

## 2023-07-17 ENCOUNTER — Ambulatory Visit
Admission: RE | Admit: 2023-07-17 | Discharge: 2023-07-17 | Disposition: A | Discharge status: Home / Self Care | Payer: PPO | Source: Ambulatory Visit | Attending: Radiation Oncology | Admitting: Radiation Oncology

## 2023-07-17 ENCOUNTER — Encounter: Payer: Self-pay | Admitting: Nurse Practitioner

## 2023-07-17 ENCOUNTER — Encounter: Payer: Self-pay | Admitting: Radiation Oncology

## 2023-07-17 VITALS — BP 131/89 | HR 109 | Temp 98.7°F | Resp 16

## 2023-07-17 VITALS — BP 101/68 | HR 88 | Temp 98.7°F | Resp 16

## 2023-07-17 DIAGNOSIS — C3490 Malignant neoplasm of unspecified part of unspecified bronchus or lung: Secondary | ICD-10-CM

## 2023-07-17 DIAGNOSIS — D6959 Other secondary thrombocytopenia: Secondary | ICD-10-CM

## 2023-07-17 DIAGNOSIS — D701 Agranulocytosis secondary to cancer chemotherapy: Secondary | ICD-10-CM | POA: Diagnosis not present

## 2023-07-17 DIAGNOSIS — C3412 Malignant neoplasm of upper lobe, left bronchus or lung: Secondary | ICD-10-CM | POA: Insufficient documentation

## 2023-07-17 DIAGNOSIS — Z5111 Encounter for antineoplastic chemotherapy: Secondary | ICD-10-CM | POA: Insufficient documentation

## 2023-07-17 DIAGNOSIS — C7951 Secondary malignant neoplasm of bone: Secondary | ICD-10-CM

## 2023-07-17 DIAGNOSIS — T451X5A Adverse effect of antineoplastic and immunosuppressive drugs, initial encounter: Secondary | ICD-10-CM | POA: Insufficient documentation

## 2023-07-17 LAB — CBC WITH DIFFERENTIAL (CANCER CENTER ONLY)
Abs Immature Granulocytes: 0.03 10*3/uL (ref 0.00–0.07)
Basophils Absolute: 0 10*3/uL (ref 0.0–0.1)
Basophils Relative: 1 %
Eosinophils Absolute: 0 10*3/uL (ref 0.0–0.5)
Eosinophils Relative: 1 %
HCT: 31 % — ABNORMAL LOW (ref 36.0–46.0)
Hemoglobin: 10 g/dL — ABNORMAL LOW (ref 12.0–15.0)
Immature Granulocytes: 1 %
Lymphocytes Relative: 18 %
Lymphs Abs: 0.7 10*3/uL (ref 0.7–4.0)
MCH: 32.8 pg (ref 26.0–34.0)
MCHC: 32.3 g/dL (ref 30.0–36.0)
MCV: 101.6 fL — ABNORMAL HIGH (ref 80.0–100.0)
Monocytes Absolute: 0.3 10*3/uL (ref 0.1–1.0)
Monocytes Relative: 8 %
Neutro Abs: 2.6 10*3/uL (ref 1.7–7.7)
Neutrophils Relative %: 71 %
Platelet Count: 105 10*3/uL — ABNORMAL LOW (ref 150–400)
RBC: 3.05 MIL/uL — ABNORMAL LOW (ref 3.87–5.11)
RDW: 15.2 % (ref 11.5–15.5)
WBC Count: 3.6 10*3/uL — ABNORMAL LOW (ref 4.0–10.5)
nRBC: 0 % (ref 0.0–0.2)

## 2023-07-17 LAB — CMP (CANCER CENTER ONLY)
ALT: 14 U/L (ref 0–44)
AST: 22 U/L (ref 15–41)
Albumin: 3.6 g/dL (ref 3.5–5.0)
Alkaline Phosphatase: 176 U/L — ABNORMAL HIGH (ref 38–126)
Anion gap: 11 (ref 5–15)
BUN: 22 mg/dL (ref 8–23)
CO2: 21 mmol/L — ABNORMAL LOW (ref 22–32)
Calcium: 9.2 mg/dL (ref 8.9–10.3)
Chloride: 104 mmol/L (ref 98–111)
Creatinine: 1.32 mg/dL — ABNORMAL HIGH (ref 0.44–1.00)
GFR, Estimated: 43 mL/min — ABNORMAL LOW (ref >60)
Glucose, Bld: 147 mg/dL — ABNORMAL HIGH (ref 70–99)
Potassium: 4.1 mmol/L (ref 3.5–5.1)
Sodium: 136 mmol/L (ref 135–145)
Total Bilirubin: 0.5 mg/dL (ref 0.0–1.2)
Total Protein: 7 g/dL (ref 6.5–8.1)

## 2023-07-17 MED ORDER — SODIUM CHLORIDE 0.9 % IV SOLN
INTRAVENOUS | Status: DC
Start: 2023-07-17 — End: 2023-07-17
  Filled 2023-07-17: qty 250

## 2023-07-17 MED ORDER — HEPARIN SOD (PORK) LOCK FLUSH 100 UNIT/ML IV SOLN
500.0000 [IU] | Freq: Once | INTRAVENOUS | Status: DC | PRN
  Filled 2023-07-17: qty 5

## 2023-07-17 MED ORDER — DENOSUMAB 120 MG/1.7ML ~~LOC~~ SOLN
120.0000 mg | Freq: Once | SUBCUTANEOUS | Status: AC
  Administered 2023-07-17: 120 mg via SUBCUTANEOUS
  Filled 2023-07-17: qty 1.7

## 2023-07-17 MED ORDER — SODIUM CHLORIDE 0.9% FLUSH
10.0000 mL | INTRAVENOUS | Status: DC | PRN
Start: 2023-07-17 — End: 2023-07-17
  Filled 2023-07-17: qty 10

## 2023-07-17 MED ORDER — SODIUM CHLORIDE 0.9 % IV SOLN
650.0000 mg/m2 | Freq: Once | INTRAVENOUS | Status: AC
  Administered 2023-07-17: 1026 mg via INTRAVENOUS
  Filled 2023-07-17: qty 26.98

## 2023-07-17 MED ORDER — PROCHLORPERAZINE MALEATE 10 MG PO TABS
10.0000 mg | ORAL_TABLET | Freq: Once | ORAL | Status: AC
  Administered 2023-07-17: 10 mg via ORAL
  Filled 2023-07-17: qty 1

## 2023-07-17 MED ORDER — PEGFILGRASTIM 6 MG/0.6ML ~~LOC~~ PSKT
6.0000 mg | PREFILLED_SYRINGE | Freq: Once | SUBCUTANEOUS | Status: AC
  Administered 2023-07-17: 6 mg via SUBCUTANEOUS
  Filled 2023-07-17: qty 0.6

## 2023-07-17 NOTE — Progress Notes (Signed)
 Radiation Oncology Follow up Note  Name: Victoria Foley   Date:   07/17/2023 MRN:  [id number] DOB: 07/25/1950    This 73 y.o. female presents to the infusion center for follow-up 1 month out from palliative radiation therapy to her lumbar spine in patient with stage IV adenocarcinoma of the lung REFERRING PROVIDER: Dana Allan, MD  HPI: Patient is a 73 year old female now out 1 month having palliative radiation therapy to her lumbar spine she is having no significant pain in her lower back at this time just claims of some stiffness.  Also she is having no headaches no change in mental status..  She is currently receiving gemcitabine.  She complains of no other areas of painful metastasis at this time.  COMPLICATIONS OF TREATMENT: none  FOLLOW UP COMPLIANCE: keeps appointments   PHYSICAL EXAM:  There were no vitals taken for this visit. Motor and sensory levels are equal and symmetric in lower extremities.  Well-developed well-nourished patient in NAD. HEENT reveals PERLA, EOMI, discs not visualized.  Oral cavity is clear. No oral mucosal lesions are identified. Neck is clear without evidence of cervical or supraclavicular adenopathy. Lungs are clear to A&P. Cardiac examination is essentially unremarkable with regular rate and rhythm without murmur rub or thrill. Abdomen is benign with no organomegaly or masses noted. Motor sensory and DTR levels are equal and symmetric in the upper and lower extremities. Cranial nerves II through XII are grossly intact. Proprioception is intact. No peripheral adenopathy or edema is identified. No motor or sensory levels are noted. Crude visual fields are within normal range.  RADIOLOGY RESULTS: No current films to review  PLAN: At the present time patient is doing well I am going to turn follow-up care over to medical oncology.  She continues treatment through their department.  I would be happy to reevaluate the patient in time the future should that be  indicated.  I would like to take this opportunity to thank you for allowing me to participate in the care of your patient.Carmina Miller, MD

## 2023-07-17 NOTE — Patient Instructions (Addendum)
 [facility] - A DEPT OF [facility]  Discharge Instructions: Thank you for choosing [facility] to provide your oncology and hematology care.  If you have a lab appointment with the Cancer Center, please go directly to the Cancer Center and check in at the registration area.  Wear comfortable clothing and clothing appropriate for easy access to any Portacath or PICC line.   We strive to give you quality time with your provider. You may need to reschedule your appointment if you arrive late (15 or more minutes).  Arriving late affects you and other patients whose appointments are after yours.  Also, if you miss three or more appointments without notifying the office, you may be dismissed from the clinic at the provider's discretion.      For prescription refill requests, have your pharmacy contact our office and allow 72 hours for refills to be completed.    Today you received the following chemotherapy and/or immunotherapy agents Gemcitabine      To help prevent nausea and vomiting after your treatment, we encourage you to take your nausea medication as directed.  BELOW ARE SYMPTOMS THAT SHOULD BE REPORTED IMMEDIATELY: *FEVER GREATER THAN 100.4 F (38 C) OR HIGHER *CHILLS OR SWEATING *NAUSEA AND VOMITING THAT IS NOT CONTROLLED WITH YOUR NAUSEA MEDICATION *UNUSUAL SHORTNESS OF BREATH *UNUSUAL BRUISING OR BLEEDING *URINARY PROBLEMS (pain or burning when urinating, or frequent urination) *BOWEL PROBLEMS (unusual diarrhea, constipation, pain near the anus) TENDERNESS IN MOUTH AND THROAT WITH OR WITHOUT PRESENCE OF ULCERS (sore throat, sores in mouth, or a toothache) UNUSUAL RASH, SWELLING OR PAIN  UNUSUAL VAGINAL DISCHARGE OR ITCHING   Items with * indicate a potential emergency and should be followed up as soon as possible or go to the Emergency Department if any problems should occur.  Please show the CHEMOTHERAPY ALERT CARD or IMMUNOTHERAPY  ALERT CARD at check-in to the Emergency Department and triage nurse.  Should you have questions after your visit or need to cancel or reschedule your appointment, please contact [facility] - A DEPT OF [facility]  [phone] and follow the prompts.  Office hours are 8:00 a.m. to 4:30 p.m. Monday - Friday. Please note that voicemails left after 4:00 p.m. may not be returned until the following business day.  We are closed weekends and major holidays. You have access to a nurse at all times for urgent questions. Please call the main number to the clinic [phone] and follow the prompts.  For any non-urgent questions, you may also contact your provider using MyChart. We now offer e-Visits for anyone 5 and older to request care online for non-urgent symptoms. For details visit [email or web address].   Also download the MyChart app! Go to the app store, search "MyChart", open the app, select [place], and log in with your MyChart username and password.  Claritin and Tylenol with help with the bone pain from the neulasta on-body injection.

## 2023-07-17 NOTE — Progress Notes (Signed)
 [facility] OFFICE PROGRESS NOTE  Patient Care Team: Dana Allan, MD as PCP - General (Family Medicine) Glory Buff, RN as Oncology Nurse Navigator Michaelyn Barter, MD as Consulting Physician (Oncology) Sondra Come, MD as Consulting Physician (Urology)  TREATMENT:  Right hip palliative RT 30 cGy completed 12/18/2021 Carboplatin, alimta and Keytruda x 4 cycles completed 02/19/22.  Discontinued maintenance alimta (04-23-22) due to worsening renal dysfunction, transaminitis and cytopenias Keytruda maintenance-discontinued on 09/17/2022 due to disease progression Palliative RT to left frontal lesion 5 fx completed 09/27/22 Palliative RT to left hip completed September 2024 Carboplatin (AUC 4) and paclitaxel 150 mg/m2 - x 5 cycles ( 10/07/2022- 01/21/2023). Maintenance Avastin 15 mg/kg- 03/27/2023- 04/24/23 Gemcitabine 800 mg/m day 1, 8 of 28 day cycle started on 06/26/2023 D8C1 delayed due to thrombocytopenia.   ASSESSMENT & PLAN:   # Primary lung adenocarcinoma (HCC), Stage IV, PDL1 1% -Progressed through first-line [place] Alimta, Keytruda and then [place] maintenance on 09/17/2022.  -s/p second line carbo AUC 4 and Taxol 150 mg/m on 10/07/2022.  Completed 5 cycles and then discontinued due to severe cytopenias.  Patient agreed for maintenance Avastin (was not added with chemo because patient was hesitant).  Progressed through maintenance Avastin.  Last dose 04/24/2023.  -Foundation 1 from August 2023 no targets.  Liquid foundation 1 CDX from June 2024 with no targetable mutation.  Repeat lung biopsy on 12/17/2022 sent for foundation 1 CDX testing was limited due to inadequate sample but no targets seen.  RNA fusion panel was sent on primary biopsy from August 2023.  It could not be run due to low tumor purity.  HER2 IHC 0.  C-Met amplification negative by FISH.  Lab report polysomy 7.  -PET from 05/16/2023 showed disease progression in the bones.  Continue care coordination with Dr.  Kemper Durie at [facility].   -s/p C1D1 of gemcitabine 800 mg/m on 06/26/2023.  ANC recovered after 2 doses of zarxio. However, platelets did not recover. Today she re-presents for consideration of C1D8 of gemcitabine.   - care coordinating with Dr Kemper Durie at [facility]. Dr Alena Bills has sent an email to him provided update and is awaiting response. For now, plan to move forward with plan as below.  # Chemotherapy induced thrombocytopenia- Platelets are now 105. Recommend dose reduction to 650 mg/m2. She will has next week off then return to clinic for consideration of D1C2 in 2 weeks. Anticipate that Dr Alena Bills will continue with dose reduced gemcitabine at 650 mg/m2. Also reviewed that she may adjust to one week on, one week off if ongoing thrombocytopenia. Rarely we consider thrombopoietin agonist such as Nplate. Hold off for now.   # Chemotherapy induced neutropenia- ANC was 0.7 on 3/13. She received 2 doses of zarxio after D1C1. Also discussed that we will add neulasta onpro 6 mg with her treatment today. Can anticipate side effects similar to zarxio including myalgias or arthralgias. Recommend claritin and tylenol if needed.   # Constipation - likely due to narcotics - Continue with Senokot, MiraLAX as needed. Up-titrate to effect  # Cancer-related pain - Follows with palliative care. - previously on Xtampza 9 mg twice daily with oxycodone IR 5 mg every 6 as needed - Flexeril 5 mg twice daily as needed for muscle stiffness - After spine RT, patient has completely stopped pain medication.  Reports her pain is manageable. No longer on xtampza and rarely uses oxycodone.   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  - Follow-up with Dr. Barbaraann Cao.  Dr. Barbaraann Cao discussed the case with Dr. Rushie Chestnut who plans to hold off on radiation at this time.  Since the growth is minimal and patient is  asymptomatic. - Follow-up MRI brain scheduled in August 08, 2023.  # CKD - secondary to Alimta. Cr stabilized.  - will continue to monitor.  # Chemotherapy-induced anemia  - Aranesp 300 mcg as needed.   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 Aggie Cosier on 12/18/2021 - Completed palliative RT to left hip on 01/01/2023.   - s/p palliative RT to lumbar spine completed on 06/12/2023  # Metastatic bone lesions - Xgeva 120 mg subcu monthly (last dose on 05/26/2023) - Plan is for q1 to 3 months depending on the calcium levels. - held last week due to hypocalcemia.  - Ca 9.5 today. Proceed with xgeva.   # Poor appetite & Weight loss - Continue follow-up with nutrition. - Did not tolerate olanzapine d/t side effects. Has declined other appetite stimulants  # Limited mobility & Risk of fall - Referral to home health PT because patient has limited mobility from metastatic lung cancer to the bone causing pathological fractures, pain and she is at high risk of fall.  # Access - port  Disposition:  Gemzar, xgeva, neulasta on pro today Rtc in 2 weeks for port/lab, Dr Alena Bills, gemzar- la  No orders of the defined types were placed in this encounter.   [facility]- 12/06/2021   All questions were answered. The patient knows to call the clinic with any problems, questions or concerns.  The total time spent in the appointment was 40 minutes encounter with patients including review of chart and various tests results, discussions about plan of care and coordination of care plan   Alinda Dooms, NP 07/17/2023   INTERVAL HISTORY: Patient seen today as follow-up accompanied with daughter for reconsideration of C1D8 gemcitabine chemotherapy for stage IV lung adenocarcinoma. She feels at baseline and is eager to proceed with chemotherapy. Denies pain. Rarely uses oxycodone.   REVIEW OF SYSTEMS:   Review of Systems  Constitutional:  Negative for chills,  fever, malaise/fatigue and weight loss.  HENT:  Negative for hearing loss, nosebleeds, sore throat and tinnitus.   Eyes:  Negative for blurred vision and double vision.  Respiratory:  Negative for cough, hemoptysis, shortness of breath and wheezing.   Cardiovascular:  Negative for chest pain, palpitations and leg swelling.  Gastrointestinal:  Positive for constipation. Negative for abdominal pain, blood in stool, diarrhea, melena, nausea and vomiting.  Genitourinary:  Negative for dysuria and urgency.  Musculoskeletal:  Positive for joint pain. Negative for back pain, falls and myalgias.  Skin:  Negative for itching and rash.  Neurological:  Negative for dizziness, tingling, sensory change, loss of consciousness, weakness and headaches.  Endo/Heme/Allergies:  Negative for environmental allergies. Does not bruise/bleed easily.  Psychiatric/Behavioral:  Negative for depression. The patient is not nervous/anxious and does not have insomnia.    ALLERGIES:  is allergic to diclofenac, lisinopril, and sulfa antibiotics.  MEDICATIONS:  Current Outpatient Medications  Medication Sig Dispense Refill   albuterol (VENTOLIN HFA) 108 (90 Base) MCG/ACT inhaler Inhale 2 puffs into the lungs every 6 (six) hours as needed for wheezing or shortness of breath. 18 g 0   Azelastine HCl 137 MCG/SPRAY SOLN Place 2 sprays into both nostrils 2 (two) times daily.  Cholecalciferol (VITAMIN D-3 PO) Take by mouth.     cyclobenzaprine (FLEXERIL) 5 MG tablet Take 1 tablet (5 mg total) by mouth 2 (two) times daily as needed for muscle spasms. 30 tablet 0   fluticasone (FLONASE) 50 MCG/ACT nasal spray USE TWO SPRAYS IN EACH NOSTRIL ONCE DAILY prn 16 g 12   lactulose (CHRONULAC) 10 GM/15ML solution Take 15 mLs (10 g total) by mouth 3 (three) times daily. (Patient not taking: Reported on 06/26/2023) 236 mL 0   loratadine (CLARITIN) 10 MG tablet TAKE ONE TABLET BY MOUTH DAILY AS NEEDED FOR ALLERGIES 90 tablet 3   losartan  (COZAAR) 100 MG tablet Take 1 tablet (100 mg total) by mouth daily. 90 tablet 3   magic mouthwash w/lidocaine SOLN Take 5 mLs by mouth 4 (four) times daily as needed for mouth pain. Sig: Swish/Swallow 5-10 ml four times a day as needed. Dispense 480 ml. 1RF (Patient not taking: Reported on 06/26/2023) 480 mL 1   naloxone (NARCAN) nasal spray 4 mg/0.1 mL SPRAY 1 SPRAY INTO ONE NOSTRIL AS DIRECTED FOR OPIOID OVERDOSE (TURN PERSON ON SIDE AFTER DOSE. IF NO RESPONSE IN 2-3 MINUTES OR PERSON RESPONDS BUT RELAPSES, REPEAT USING A NEW SPRAY DEVICE AND SPRAY INTO THE OTHER NOSTRIL. CALL 911 AFTER USE.) * EMERGENCY USE ONLY * (Patient not taking: Reported on 07/17/2023) 1 each 0   OLANZapine (ZYPREXA) 5 MG tablet Take 1 tablet (5 mg total) by mouth at bedtime. For appetite 30 tablet 3   ondansetron (ZOFRAN) 8 MG tablet Take 1 tablet (8 mg total) by mouth every 8 (eight) hours as needed for nausea or vomiting. 30 tablet 3   oxyCODONE (OXY IR/ROXICODONE) 5 MG immediate release tablet Take 1-2 tablets (5-10 mg total) by mouth every 4 (four) hours as needed for severe pain (pain score 7-10). (Patient not taking: Reported on 07/17/2023) 60 tablet 0   oxyCODONE ER (XTAMPZA ER) 9 MG C12A Take 1 capsule by mouth every 12 (twelve) hours. 60 capsule 0   senna (SENOKOT) 8.6 MG TABS tablet Take 1 tablet by mouth daily.     No current facility-administered medications for this visit.   Facility-Administered Medications Ordered in Other Visits  Medication Dose Route Frequency Provider Last Rate Last Admin   0.9 %  sodium chloride infusion   Intravenous Continuous Michaelyn Barter, MD   Stopped at 07/17/23 1417   heparin lock flush 100 UNIT/ML injection            heparin lock flush 100 unit/mL  500 Units Intravenous Once Michaelyn Barter, MD       heparin lock flush 100 unit/mL  500 Units Intracatheter Once PRN Michaelyn Barter, MD       sodium chloride flush (NS) 0.9 % injection 10 mL  10 mL Intravenous Once Michaelyn Barter, MD        sodium chloride flush (NS) 0.9 % injection 10 mL  10 mL Intracatheter PRN Michaelyn Barter, MD        SUMMARY OF ONCOLOGIC HISTORY: Oncology History  Primary lung adenocarcinoma (HCC)  10/10/2021 Initial Diagnosis   Primary lung adenocarcinoma (HCC) Stage IV  Patient seen by PCP for wheezing for 2 weeks. Imaging with CXR followed by CT chest showed Spiculated 2.1 x 2.2 cm left upper lobe pulmonary nodule with associated pleural tethering. Innumerable subcentimeter pulmonary nodules throughout the lungs.      11/14/2021 PET scan   IMPRESSION: 1. Left suprahilar nodule with maximum SUV 4.5, and a more distal 2.2  cm left upper lobe nodule with maximum SUV of 5.5. 2. Innumerable scattered small pulmonary nodules, most of which are under 5 mm in diameter, throughout both lungs. Some are cavitary. Possibilities include cavitating hematogenous disseminated malignancy versus infectious etiology subset as septic emboli. Most of the nodules are stable, some are minimally enlarged compared to 10/10/2021. 3. Abnormal mild permeative type bony findings in the right hemipelvis associated with substantially accentuated metabolic activity. Possible associated pathologic fracture through the right quadrilateral plate and right acetabulum. The findings involve the right ilium and ischium but also the right lateral sacral ala and a small focus in the left sacrum.    Pathology Results   She had transbronchial biopsies by Dr. Jayme Cloud on 11/19/21.  Pathology from LUL nodule showed adenocarcinoma, consistent with lung primary.    11/19/2021 Cancer Staging   Staging form: Lung, AJCC 8th Edition - Clinical stage from 11/19/2021: Stage IVB (cT1c, cM1c) - Signed by Michaelyn Barter, MD on 11/27/2021 Stage prefix: Initial diagnosis   11/27/2021 Imaging   MRI pelvis  IMPRESSION: 1. Abnormal marrow signal throughout the right ilium involving the right acetabulum, right superior pubic ramus and right  inferior pubic ramus, right side of the sacrum and a smaller focus of abnormal marrow lesion in the left side of the sacrum. Findings are concerning for metastatic disease. 2. Nondisplaced pathologic fracture of the quadrilateral plate of the right acetabulum. 3. Mild muscle edema in the right gluteus minimus and medius muscles likely reflecting mild muscle strain.  MRI brain  IMPRESSION: No evidence of intracranial metastatic disease.   Indeterminate 1 cm left frontal calvarium lesion.   12/18/2021 - 08/27/2022 Chemotherapy   Patient is on Treatment Plan : LUNG Carboplatin (5) + Pemetrexed (500) + Pembrolizumab (200) D1 q21d Induction x 4 cycles / Maintenance Pemetrexed (500) + Pembrolizumab (200) D1 q21d     10/07/2022 - 01/21/2023 Chemotherapy   Patient is on Treatment Plan : LUNG Carboplatin + Paclitaxel q21d Dose Reduction     03/27/2023 - 04/24/2023 Chemotherapy   Patient is on Treatment Plan : LUNG Bevacizumab q21d     06/26/2023 -  Chemotherapy   Patient is on Treatment Plan : LUNG Gemcitabine (1000) D1,8 q21d     Primary malignant neoplasm of left upper lobe of lung (HCC)  06/04/2022 Initial Diagnosis   Primary malignant neoplasm of left upper lobe of lung (HCC)   06/04/2022 Cancer Staging   Staging form: Lung, AJCC 8th Edition - Pathologic: Stage IVB (pT1c, pNX, cM1c) - Signed by Earna Coder, MD on 06/04/2022     PHYSICAL EXAMINATION: ECOG PERFORMANCE STATUS: 2 - Symptomatic, <50% confined to bed  Vitals:   07/17/23 1148  BP: 131/89  Pulse: (!) 109  Resp: 16  Temp: 98.7 F (37.1 C)   There were no vitals filed for this visit.  Physical Exam Vitals reviewed.  Constitutional:      Appearance: Normal appearance. She is not ill-appearing.  HENT:     Head: Normocephalic and atraumatic.  Cardiovascular:     Rate and Rhythm: Normal rate.  Pulmonary:     Effort: Pulmonary effort is normal. No respiratory distress.  Musculoskeletal:     Right lower leg: No  edema.     Left lower leg: No edema.     Comments: cane  Skin:    General: Skin is warm.     Coloration: Skin is not pale.     Findings: No bruising.  Neurological:     Mental Status:  She is oriented to person, place, and time.  Psychiatric:        Mood and Affect: Mood normal.        Behavior: Behavior normal.    LABORATORY DATA:  I have reviewed the data as listed    Component Value Date/Time   NA 136 07/17/2023 1131   K 4.1 07/17/2023 1131   CL 104 07/17/2023 1131   CO2 21 (L) 07/17/2023 1131   GLUCOSE 147 (H) 07/17/2023 1131   BUN 22 07/17/2023 1131   CREATININE 1.32 (H) 07/17/2023 1131   CALCIUM 9.2 07/17/2023 1131   PROT 7.0 07/17/2023 1131   ALBUMIN 3.6 07/17/2023 1131   AST 22 07/17/2023 1131   ALT 14 07/17/2023 1131   ALKPHOS 176 (H) 07/17/2023 1131   BILITOT 0.5 07/17/2023 1131   GFRNONAA 43 (L) 07/17/2023 1131   Lab Results  Component Value Date   WBC 3.6 (L) 07/17/2023   NEUTROABS 2.6 07/17/2023   HGB 10.0 (L) 07/17/2023   HCT 31.0 (L) 07/17/2023   MCV 101.6 (H) 07/17/2023   PLT 105 (L) 07/17/2023    RADIOGRAPHIC STUDIES: I have personally reviewed the radiological images as listed and agreed with the findings in the report. No results found.

## 2023-07-31 ENCOUNTER — Other Ambulatory Visit: Payer: Self-pay

## 2023-07-31 ENCOUNTER — Inpatient Hospital Stay (HOSPITAL_BASED_OUTPATIENT_CLINIC_OR_DEPARTMENT_OTHER): Admitting: Internal Medicine

## 2023-07-31 ENCOUNTER — Encounter: Payer: Self-pay | Admitting: Internal Medicine

## 2023-07-31 ENCOUNTER — Inpatient Hospital Stay: Attending: Internal Medicine

## 2023-07-31 ENCOUNTER — Inpatient Hospital Stay

## 2023-07-31 VITALS — BP 127/83 | HR 109 | Temp 97.3°F | Resp 18 | Ht 59.0 in | Wt 131.0 lb

## 2023-07-31 DIAGNOSIS — Z5189 Encounter for other specified aftercare: Secondary | ICD-10-CM | POA: Insufficient documentation

## 2023-07-31 DIAGNOSIS — G893 Neoplasm related pain (acute) (chronic): Secondary | ICD-10-CM | POA: Diagnosis not present

## 2023-07-31 DIAGNOSIS — C7931 Secondary malignant neoplasm of brain: Secondary | ICD-10-CM | POA: Insufficient documentation

## 2023-07-31 DIAGNOSIS — C3490 Malignant neoplasm of unspecified part of unspecified bronchus or lung: Secondary | ICD-10-CM

## 2023-07-31 DIAGNOSIS — C3412 Malignant neoplasm of upper lobe, left bronchus or lung: Secondary | ICD-10-CM | POA: Diagnosis not present

## 2023-07-31 DIAGNOSIS — Z95828 Presence of other vascular implants and grafts: Secondary | ICD-10-CM

## 2023-07-31 DIAGNOSIS — K5900 Constipation, unspecified: Secondary | ICD-10-CM | POA: Diagnosis not present

## 2023-07-31 DIAGNOSIS — D6959 Other secondary thrombocytopenia: Secondary | ICD-10-CM

## 2023-07-31 DIAGNOSIS — T451X5A Adverse effect of antineoplastic and immunosuppressive drugs, initial encounter: Secondary | ICD-10-CM | POA: Insufficient documentation

## 2023-07-31 DIAGNOSIS — Z79899 Other long term (current) drug therapy: Secondary | ICD-10-CM | POA: Insufficient documentation

## 2023-07-31 DIAGNOSIS — C7951 Secondary malignant neoplasm of bone: Secondary | ICD-10-CM | POA: Diagnosis not present

## 2023-07-31 DIAGNOSIS — N189 Chronic kidney disease, unspecified: Secondary | ICD-10-CM | POA: Diagnosis not present

## 2023-07-31 DIAGNOSIS — Z5111 Encounter for antineoplastic chemotherapy: Secondary | ICD-10-CM

## 2023-07-31 DIAGNOSIS — R630 Anorexia: Secondary | ICD-10-CM | POA: Insufficient documentation

## 2023-07-31 DIAGNOSIS — D6481 Anemia due to antineoplastic chemotherapy: Secondary | ICD-10-CM

## 2023-07-31 LAB — CBC WITH DIFFERENTIAL (CANCER CENTER ONLY)
Abs Immature Granulocytes: 0.14 10*3/uL — ABNORMAL HIGH (ref 0.00–0.07)
Basophils Absolute: 0 10*3/uL (ref 0.0–0.1)
Basophils Relative: 1 %
Eosinophils Absolute: 0.1 10*3/uL (ref 0.0–0.5)
Eosinophils Relative: 1 %
HCT: 28.7 % — ABNORMAL LOW (ref 36.0–46.0)
Hemoglobin: 9.2 g/dL — ABNORMAL LOW (ref 12.0–15.0)
Immature Granulocytes: 2 %
Lymphocytes Relative: 13 %
Lymphs Abs: 1 10*3/uL (ref 0.7–4.0)
MCH: 33.2 pg (ref 26.0–34.0)
MCHC: 32.1 g/dL (ref 30.0–36.0)
MCV: 103.6 fL — ABNORMAL HIGH (ref 80.0–100.0)
Monocytes Absolute: 0.6 10*3/uL (ref 0.1–1.0)
Monocytes Relative: 8 %
Neutro Abs: 5.8 10*3/uL (ref 1.7–7.7)
Neutrophils Relative %: 75 %
Platelet Count: 84 10*3/uL — ABNORMAL LOW (ref 150–400)
RBC: 2.77 MIL/uL — ABNORMAL LOW (ref 3.87–5.11)
RDW: 16.8 % — ABNORMAL HIGH (ref 11.5–15.5)
WBC Count: 7.6 10*3/uL (ref 4.0–10.5)
nRBC: 0 % (ref 0.0–0.2)

## 2023-07-31 LAB — CMP (CANCER CENTER ONLY)
ALT: 15 U/L (ref 0–44)
AST: 21 U/L (ref 15–41)
Albumin: 3.4 g/dL — ABNORMAL LOW (ref 3.5–5.0)
Alkaline Phosphatase: 173 U/L — ABNORMAL HIGH (ref 38–126)
Anion gap: 8 (ref 5–15)
BUN: 21 mg/dL (ref 8–23)
CO2: 19 mmol/L — ABNORMAL LOW (ref 22–32)
Calcium: 8.5 mg/dL — ABNORMAL LOW (ref 8.9–10.3)
Chloride: 108 mmol/L (ref 98–111)
Creatinine: 1.25 mg/dL — ABNORMAL HIGH (ref 0.44–1.00)
GFR, Estimated: 46 mL/min — ABNORMAL LOW (ref >60)
Glucose, Bld: 103 mg/dL — ABNORMAL HIGH (ref 70–99)
Potassium: 4.1 mmol/L (ref 3.5–5.1)
Sodium: 135 mmol/L (ref 135–145)
Total Bilirubin: 0.5 mg/dL (ref 0.0–1.2)
Total Protein: 6.7 g/dL (ref 6.5–8.1)

## 2023-07-31 MED ORDER — HEPARIN SOD (PORK) LOCK FLUSH 100 UNIT/ML IV SOLN
500.0000 [IU] | Freq: Once | INTRAVENOUS | Status: AC
  Administered 2023-07-31: 500 [IU]
  Filled 2023-07-31: qty 5

## 2023-07-31 MED ORDER — MIRTAZAPINE 15 MG PO TABS: 15.0000 mg | ORAL_TABLET | Freq: Every day | ORAL | 0 refills | Status: DC

## 2023-07-31 NOTE — Progress Notes (Signed)
 [facility] OFFICE PROGRESS NOTE  Patient Care Team: Dana Allan, MD as PCP - General (Family Medicine) Glory Buff, RN as Oncology Nurse Navigator Michaelyn Barter, MD as Consulting Physician (Oncology) Sondra Come, MD as Consulting Physician (Urology)  TREATMENT:  Right hip palliative RT 30 cGy completed 12/18/2021 Carboplatin, alimta and Keytruda x 4 cycles completed 02/19/22.  Discontinued maintenance alimta (04-23-22) due to worsening renal dysfunction, transaminitis and cytopenias Keytruda maintenance-discontinued on 09/17/2022 due to disease progression Palliative RT to left frontal lesion 5 fx completed 09/27/22 Palliative RT to left hip completed September 2024 Carboplatin (AUC 4) and paclitaxel 150 mg/m2 - x 5 cycles ( 10/07/2022- 01/21/2023). Maintenance Avastin 15 mg/kg- 03/27/2023- 04/24/23 Gemcitabine 800 mg/m day 1 day 8, started on 06/26/2023  ASSESSMENT & PLAN:   # Primary lung adenocarcinoma (HCC), Stage IV, PDL1 1% -Progressed through first-line [place] Alimta, Keytruda and then [place] maintenance on 09/17/2022.  -s/p second line carbo AUC 4 and Taxol 150 mg/m on 10/07/2022.  Completed 5 cycles and then discontinued due to severe cytopenias.  Patient agreed for maintenance Avastin (was not added with chemo because patient was hesitant).  Progressed through maintenance Avastin.  Last dose 04/24/2023.  -Foundation 1 from August 2023 no targets.  Liquid foundation 1 CDX from June 2024 with no targetable mutation.  Repeat lung biopsy on 12/17/2022 sent for foundation 1 CDX testing was limited due to inadequate sample but no targets seen.  RNA fusion panel was sent on primary biopsy from August 2023.  It could not be run due to low tumor purity.  HER2 IHC 0.  C-Met amplification negative by FISH.  Lab report polysomy 7.  -PET from 05/16/2023 showed disease progression in the bones.  Continue care coordination with Dr. Kemper Durie at [facility].   -s/p C1D1 of gemcitabine 800  mg/m on 06/26/2023.  ANC 800.  Started on Neulasta support.  Platelets nadir at 50 and required about 3 weeks to recover.  Dose reduced gemcitabine to 650 mg/m last received on 07/17/2023.  Today her platelets are low at 84.  Defer treatment to next week.   # Constipation -Continue with Senokot, MiraLAX as needed.  # Cancer-related pain -Follows with palliative care. -Xtampza 9 mg twice daily with oxycodone IR 5 mg every 6 as needed -Flexeril 5 mg twice daily as needed for muscle stiffness -After spine RT, patient has completely stopped pain medication.  Reports her pain is manageable.  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   -Follow-up with Dr. Barbaraann Cao.  Dr. Barbaraann Cao discussed the case with Dr. Rushie Chestnut who plans to hold off on radiation at this time.  Since the growth is minimal and patient is asymptomatic. -Follow-up MRI brain scheduled in April 2025.  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 Cr stabilized.  -will continue to monitor.  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 Aggie Cosier on 12/18/2021 -Completed palliative RT to left hip on 01/01/2023.   -s/p palliative RT to lumbar spine completed on 06/12/2023  # Metastatic bone lesions -Xgeva 120 mg subcu monthly (last dose on 07/17/2023) -Plan is for q1 to 3 months depending on the calcium levels.  # Poor appetite # Weight loss -Continue follow-up with nutrition. -Olanzapine-did not tolerate.  Tells me he does not like the way she feels in her brain -I will be able to  do Remeron 15 mg once daily.  # Limited mobility # Risk of fall -Referral to home health PT because patient has limited mobility from metastatic lung cancer to the bone causing pathological fractures, pain and she is at high risk of fall.  # Access - port  RTC in 1  week for MD visit, labs, Gemzar RTC in 3 weeks for MD visit, labs, Gemzar  Orders Placed This Encounter  Procedures   CBC with Differential (Cancer Center Only)    Standing Status:   Future    Expected Date:   08/07/2023    Expiration Date:   08/06/2024   CMP (Cancer Center only)    Standing Status:   Future    Expected Date:   08/07/2023    Expiration Date:   08/06/2024    [facility]- 12/06/2021   All questions were answered. The patient knows to call the clinic with any problems, questions or concerns. The total time spent in the appointment was 30 minutes encounter with patients including review of chart and various tests results, discussions about plan of care and coordination of care plan   Michaelyn Barter, MD 07/31/2023 3:31 PM  INTERVAL HISTORY: Patient seen today as follow-up accompanied with daughter for management of stage IV lung adenocarcinoma. Patient reports feeling well overall.  Her pain is significantly improved after completion of radiation.  She is not taking any opioids.  Appetite is fair.  She feels hungry but does not have desire to eat.  She continues to follow with nutrition.  She feels stiffness in her joints and is hoping to get home health physical therapy.   REVIEW OF SYSTEMS:   Positive ROS as above.  Rest 10 points ROS negative   I have reviewed the past medical history, past surgical history, social history and family history with the patient and they are unchanged from previous note.  ALLERGIES:  is allergic to diclofenac, lisinopril, and sulfa antibiotics.  MEDICATIONS:  Current Outpatient Medications  Medication Sig Dispense Refill   albuterol (VENTOLIN HFA) 108 (90 Base) MCG/ACT inhaler Inhale 2 puffs into the lungs every 6 (six) hours as needed for wheezing or shortness of breath. 18 g 0   Azelastine HCl 137 MCG/SPRAY SOLN Place 2 sprays into both nostrils 2 (two) times daily.     Cholecalciferol (VITAMIN D-3 PO) Take by mouth.      cyclobenzaprine (FLEXERIL) 5 MG tablet Take 1 tablet (5 mg total) by mouth 2 (two) times daily as needed for muscle spasms. 30 tablet 0   fluticasone (FLONASE) 50 MCG/ACT nasal spray USE TWO SPRAYS IN EACH NOSTRIL ONCE DAILY prn 16 g 12   loratadine (CLARITIN) 10 MG tablet TAKE ONE TABLET BY MOUTH DAILY AS NEEDED FOR ALLERGIES 90 tablet 3   losartan (COZAAR) 100 MG tablet Take 1 tablet (100 mg total) by mouth daily. 90 tablet 3   mirtazapine (REMERON) 15 MG tablet Take 1 tablet (15 mg total) by mouth at bedtime. 30 tablet 0   oxyCODONE ER (XTAMPZA ER) 9 MG C12A Take 1 capsule by mouth every 12 (twelve) hours. 60 capsule 0   senna (SENOKOT) 8.6 MG TABS tablet Take 1 tablet by mouth daily.     lactulose (CHRONULAC) 10 GM/15ML solution Take 15 mLs (10 g total) by mouth 3 (three) times daily. (Patient not taking: Reported on 07/31/2023) 236 mL 0   magic mouthwash w/lidocaine SOLN Take 5 mLs by mouth 4 (four) times daily as needed for mouth pain. Sig: Swish/Swallow  5-10 ml four times a day as needed. Dispense 480 ml. 1RF (Patient not taking: Reported on 07/31/2023) 480 mL 1   naloxone (NARCAN) nasal spray 4 mg/0.1 mL SPRAY 1 SPRAY INTO ONE NOSTRIL AS DIRECTED FOR OPIOID OVERDOSE (TURN PERSON ON SIDE AFTER DOSE. IF NO RESPONSE IN 2-3 MINUTES OR PERSON RESPONDS BUT RELAPSES, REPEAT USING A NEW SPRAY DEVICE AND SPRAY INTO THE OTHER NOSTRIL. CALL 911 AFTER USE.) * EMERGENCY USE ONLY * (Patient not taking: Reported on 07/31/2023) 1 each 0   OLANZapine (ZYPREXA) 5 MG tablet Take 1 tablet (5 mg total) by mouth at bedtime. For appetite (Patient not taking: Reported on 07/31/2023) 30 tablet 3   ondansetron (ZOFRAN) 8 MG tablet Take 1 tablet (8 mg total) by mouth every 8 (eight) hours as needed for nausea or vomiting. (Patient not taking: Reported on 07/31/2023) 30 tablet 3   oxyCODONE (OXY IR/ROXICODONE) 5 MG immediate release tablet Take 1-2 tablets (5-10 mg total) by mouth every 4 (four) hours as needed for severe pain  (pain score 7-10). (Patient not taking: Reported on 07/03/2023) 60 tablet 0   No current facility-administered medications for this visit.   Facility-Administered Medications Ordered in Other Visits  Medication Dose Route Frequency Provider Last Rate Last Admin   heparin lock flush 100 UNIT/ML injection            heparin lock flush 100 unit/mL  500 Units Intravenous Once Michaelyn Barter, MD       sodium chloride flush (NS) 0.9 % injection 10 mL  10 mL Intravenous Once Michaelyn Barter, MD        SUMMARY OF ONCOLOGIC HISTORY: Oncology History  Primary lung adenocarcinoma (HCC)  10/10/2021 Initial Diagnosis   Primary lung adenocarcinoma (HCC) Stage IV  Patient seen by PCP for wheezing for 2 weeks. Imaging with CXR followed by CT chest showed Spiculated 2.1 x 2.2 cm left upper lobe pulmonary nodule with associated pleural tethering. Innumerable subcentimeter pulmonary nodules throughout the lungs.      11/14/2021 PET scan   IMPRESSION: 1. Left suprahilar nodule with maximum SUV 4.5, and a more distal 2.2 cm left upper lobe nodule with maximum SUV of 5.5. 2. Innumerable scattered small pulmonary nodules, most of which are under 5 mm in diameter, throughout both lungs. Some are cavitary. Possibilities include cavitating hematogenous disseminated malignancy versus infectious etiology subset as septic emboli. Most of the nodules are stable, some are minimally enlarged compared to 10/10/2021. 3. Abnormal mild permeative type bony findings in the right hemipelvis associated with substantially accentuated metabolic activity. Possible associated pathologic fracture through the right quadrilateral plate and right acetabulum. The findings involve the right ilium and ischium but also the right lateral sacral ala and a small focus in the left sacrum.    Pathology Results   She had transbronchial biopsies by Dr. Jayme Cloud on 11/19/21.  Pathology from LUL nodule showed adenocarcinoma, consistent  with lung primary.    11/19/2021 Cancer Staging   Staging form: Lung, AJCC 8th Edition - Clinical stage from 11/19/2021: Stage IVB (cT1c, cM1c) - Signed by Michaelyn Barter, MD on 11/27/2021 Stage prefix: Initial diagnosis   11/27/2021 Imaging   MRI pelvis  IMPRESSION: 1. Abnormal marrow signal throughout the right ilium involving the right acetabulum, right superior pubic ramus and right inferior pubic ramus, right side of the sacrum and a smaller focus of abnormal marrow lesion in the left side of the sacrum. Findings are concerning for metastatic disease. 2. Nondisplaced pathologic fracture of the  quadrilateral plate of the right acetabulum. 3. Mild muscle edema in the right gluteus minimus and medius muscles likely reflecting mild muscle strain.  MRI brain  IMPRESSION: No evidence of intracranial metastatic disease.   Indeterminate 1 cm left frontal calvarium lesion.   12/18/2021 - 08/27/2022 Chemotherapy   Patient is on Treatment Plan : LUNG Carboplatin (5) + Pemetrexed (500) + Pembrolizumab (200) D1 q21d Induction x 4 cycles / Maintenance Pemetrexed (500) + Pembrolizumab (200) D1 q21d     10/07/2022 - 01/21/2023 Chemotherapy   Patient is on Treatment Plan : LUNG Carboplatin + Paclitaxel q21d Dose Reduction     03/27/2023 - 04/24/2023 Chemotherapy   Patient is on Treatment Plan : LUNG Bevacizumab q21d     06/26/2023 -  Chemotherapy   Patient is on Treatment Plan : LUNG Gemcitabine (1000) D1,8 q21d     Primary malignant neoplasm of left upper lobe of lung (HCC)  06/04/2022 Initial Diagnosis   Primary malignant neoplasm of left upper lobe of lung (HCC)   06/04/2022 Cancer Staging   Staging form: Lung, AJCC 8th Edition - Pathologic: Stage IVB (pT1c, pNX, cM1c) - Signed by Earna Coder, MD on 06/04/2022     PHYSICAL EXAMINATION: ECOG PERFORMANCE STATUS: 2 - Symptomatic, <50% confined to bed  Vitals:   07/31/23 1328  BP: 127/83  Pulse: (!) 109  Resp: 18  Temp: (!) 97.3 F  (36.3 C)  SpO2: 100%    Filed Weights   07/31/23 1328  Weight: 131 lb (59.4 kg)      Physical Exam Constitutional:      Appearance: Normal appearance.  HENT:     Head: Normocephalic and atraumatic.  Cardiovascular:     Rate and Rhythm: Normal rate.  Pulmonary:     Effort: Pulmonary effort is normal.  Musculoskeletal:     Right lower leg: No edema.     Left lower leg: No edema.  Skin:    General: Skin is warm.  Neurological:     General: No focal deficit present.     Mental Status: She is oriented to person, place, and time.  Psychiatric:        Mood and Affect: Mood normal.     LABORATORY DATA:  I have reviewed the data as listed    Component Value Date/Time   NA 135 07/31/2023 1311   K 4.1 07/31/2023 1311   CL 108 07/31/2023 1311   CO2 19 (L) 07/31/2023 1311   GLUCOSE 103 (H) 07/31/2023 1311   BUN 21 07/31/2023 1311   CREATININE 1.25 (H) 07/31/2023 1311   CALCIUM 8.5 (L) 07/31/2023 1311   PROT 6.7 07/31/2023 1311   ALBUMIN 3.4 (L) 07/31/2023 1311   AST 21 07/31/2023 1311   ALT 15 07/31/2023 1311   ALKPHOS 173 (H) 07/31/2023 1311   BILITOT 0.5 07/31/2023 1311   GFRNONAA 46 (L) 07/31/2023 1311    No results found for: "SPEP", "UPEP"  Lab Results  Component Value Date   WBC 7.6 07/31/2023   NEUTROABS 5.8 07/31/2023   HGB 9.2 (L) 07/31/2023   HCT 28.7 (L) 07/31/2023   MCV 103.6 (H) 07/31/2023   PLT 84 (L) 07/31/2023      Chemistry      Component Value Date/Time   NA 135 07/31/2023 1311   K 4.1 07/31/2023 1311   CL 108 07/31/2023 1311   CO2 19 (L) 07/31/2023 1311   BUN 21 07/31/2023 1311   CREATININE 1.25 (H) 07/31/2023 1311  Component Value Date/Time   CALCIUM 8.5 (L) 07/31/2023 1311   ALKPHOS 173 (H) 07/31/2023 1311   AST 21 07/31/2023 1311   ALT 15 07/31/2023 1311   BILITOT 0.5 07/31/2023 1311       RADIOGRAPHIC STUDIES: I have personally reviewed the radiological images as listed and agreed with the findings in the  report. No results found.

## 2023-08-05 ENCOUNTER — Encounter: Payer: Self-pay | Admitting: Internal Medicine

## 2023-08-07 ENCOUNTER — Inpatient Hospital Stay (HOSPITAL_BASED_OUTPATIENT_CLINIC_OR_DEPARTMENT_OTHER): Admitting: Nurse Practitioner

## 2023-08-07 ENCOUNTER — Encounter: Payer: Self-pay | Admitting: Nurse Practitioner

## 2023-08-07 ENCOUNTER — Inpatient Hospital Stay

## 2023-08-07 VITALS — HR 83

## 2023-08-07 VITALS — BP 118/67 | HR 109 | Temp 97.4°F | Resp 14 | Wt 132.0 lb

## 2023-08-07 DIAGNOSIS — Z5111 Encounter for antineoplastic chemotherapy: Secondary | ICD-10-CM

## 2023-08-07 DIAGNOSIS — C3490 Malignant neoplasm of unspecified part of unspecified bronchus or lung: Secondary | ICD-10-CM

## 2023-08-07 DIAGNOSIS — D6959 Other secondary thrombocytopenia: Secondary | ICD-10-CM | POA: Diagnosis not present

## 2023-08-07 DIAGNOSIS — C7931 Secondary malignant neoplasm of brain: Secondary | ICD-10-CM | POA: Diagnosis not present

## 2023-08-07 DIAGNOSIS — T451X5A Adverse effect of antineoplastic and immunosuppressive drugs, initial encounter: Secondary | ICD-10-CM

## 2023-08-07 LAB — CBC WITH DIFFERENTIAL (CANCER CENTER ONLY)
Abs Immature Granulocytes: 0.05 10*3/uL (ref 0.00–0.07)
Basophils Absolute: 0 10*3/uL (ref 0.0–0.1)
Basophils Relative: 0 %
Eosinophils Absolute: 0.1 10*3/uL (ref 0.0–0.5)
Eosinophils Relative: 1 %
HCT: 29.2 % — ABNORMAL LOW (ref 36.0–46.0)
Hemoglobin: 9.3 g/dL — ABNORMAL LOW (ref 12.0–15.0)
Immature Granulocytes: 1 %
Lymphocytes Relative: 13 %
Lymphs Abs: 0.6 10*3/uL — ABNORMAL LOW (ref 0.7–4.0)
MCH: 33 pg (ref 26.0–34.0)
MCHC: 31.8 g/dL (ref 30.0–36.0)
MCV: 103.5 fL — ABNORMAL HIGH (ref 80.0–100.0)
Monocytes Absolute: 0.4 10*3/uL (ref 0.1–1.0)
Monocytes Relative: 8 %
Neutro Abs: 3.7 10*3/uL (ref 1.7–7.7)
Neutrophils Relative %: 77 %
Platelet Count: 116 10*3/uL — ABNORMAL LOW (ref 150–400)
RBC: 2.82 MIL/uL — ABNORMAL LOW (ref 3.87–5.11)
RDW: 16.6 % — ABNORMAL HIGH (ref 11.5–15.5)
WBC Count: 4.8 10*3/uL (ref 4.0–10.5)
nRBC: 0 % (ref 0.0–0.2)

## 2023-08-07 LAB — CMP (CANCER CENTER ONLY)
ALT: 12 U/L (ref 0–44)
AST: 19 U/L (ref 15–41)
Albumin: 3.5 g/dL (ref 3.5–5.0)
Alkaline Phosphatase: 175 U/L — ABNORMAL HIGH (ref 38–126)
Anion gap: 7 (ref 5–15)
BUN: 21 mg/dL (ref 8–23)
CO2: 20 mmol/L — ABNORMAL LOW (ref 22–32)
Calcium: 8.9 mg/dL (ref 8.9–10.3)
Chloride: 107 mmol/L (ref 98–111)
Creatinine: 1.24 mg/dL — ABNORMAL HIGH (ref 0.44–1.00)
GFR, Estimated: 47 mL/min — ABNORMAL LOW (ref >60)
Glucose, Bld: 144 mg/dL — ABNORMAL HIGH (ref 70–99)
Potassium: 3.9 mmol/L (ref 3.5–5.1)
Sodium: 134 mmol/L — ABNORMAL LOW (ref 135–145)
Total Bilirubin: 0.6 mg/dL (ref 0.0–1.2)
Total Protein: 6.7 g/dL (ref 6.5–8.1)

## 2023-08-07 MED ORDER — PROCHLORPERAZINE MALEATE 10 MG PO TABS
10.0000 mg | ORAL_TABLET | Freq: Once | ORAL | Status: AC
  Administered 2023-08-07: 10 mg via ORAL

## 2023-08-07 MED ORDER — HEPARIN SOD (PORK) LOCK FLUSH 100 UNIT/ML IV SOLN
500.0000 [IU] | Freq: Once | INTRAVENOUS | Status: AC | PRN
  Administered 2023-08-07: 500 [IU]
  Filled 2023-08-07: qty 5

## 2023-08-07 MED ORDER — SODIUM CHLORIDE 0.9 % IV SOLN
INTRAVENOUS | Status: DC
  Filled 2023-08-07: qty 250

## 2023-08-07 MED ORDER — SODIUM CHLORIDE 0.9 % IV SOLN
650.0000 mg/m2 | Freq: Once | INTRAVENOUS | Status: AC
  Administered 2023-08-07: 1026 mg via INTRAVENOUS
  Filled 2023-08-07: qty 26.98

## 2023-08-07 MED ORDER — PEGFILGRASTIM 6 MG/0.6ML ~~LOC~~ PSKT
6.0000 mg | PREFILLED_SYRINGE | Freq: Once | SUBCUTANEOUS | Status: AC
  Administered 2023-08-07: 6 mg via SUBCUTANEOUS
  Filled 2023-08-07: qty 0.6

## 2023-08-07 NOTE — Patient Instructions (Signed)
 [facility] - A DEPT OF [facility]  Discharge Instructions: Thank you for choosing [facility] to provide your oncology and hematology care.  If you have a lab appointment with the Cancer Center, please go directly to the Cancer Center and check in at the registration area.  Wear comfortable clothing and clothing appropriate for easy access to any Portacath or PICC line.   We strive to give you quality time with your provider. You may need to reschedule your appointment if you arrive late (15 or more minutes).  Arriving late affects you and other patients whose appointments are after yours.  Also, if you miss three or more appointments without notifying the office, you may be dismissed from the clinic at the provider's discretion.      For prescription refill requests, have your pharmacy contact our office and allow 72 hours for refills to be completed.    Today you received the following chemotherapy and/or immunotherapy agents GEMZAR and NEULASTA      To help prevent nausea and vomiting after your treatment, we encourage you to take your nausea medication as directed.  BELOW ARE SYMPTOMS THAT SHOULD BE REPORTED IMMEDIATELY: *FEVER GREATER THAN 100.4 F (38 C) OR HIGHER *CHILLS OR SWEATING *NAUSEA AND VOMITING THAT IS NOT CONTROLLED WITH YOUR NAUSEA MEDICATION *UNUSUAL SHORTNESS OF BREATH *UNUSUAL BRUISING OR BLEEDING *URINARY PROBLEMS (pain or burning when urinating, or frequent urination) *BOWEL PROBLEMS (unusual diarrhea, constipation, pain near the anus) TENDERNESS IN MOUTH AND THROAT WITH OR WITHOUT PRESENCE OF ULCERS (sore throat, sores in mouth, or a toothache) UNUSUAL RASH, SWELLING OR PAIN  UNUSUAL VAGINAL DISCHARGE OR ITCHING   Items with * indicate a potential emergency and should be followed up as soon as possible or go to the Emergency Department if any problems should occur.  Please show the CHEMOTHERAPY ALERT CARD or  IMMUNOTHERAPY ALERT CARD at check-in to the Emergency Department and triage nurse.  Should you have questions after your visit or need to cancel or reschedule your appointment, please contact [facility] - A DEPT OF [facility]  [phone] and follow the prompts.  Office hours are 8:00 a.m. to 4:30 p.m. Monday - Friday. Please note that voicemails left after 4:00 p.m. may not be returned until the following business day.  We are closed weekends and major holidays. You have access to a nurse at all times for urgent questions. Please call the main number to the clinic [phone] and follow the prompts.  For any non-urgent questions, you may also contact your provider using MyChart. We now offer e-Visits for anyone 52 and older to request care online for non-urgent symptoms. For details visit [email or web address].   Also download the MyChart app! Go to the app store, search "MyChart", open the app, select [facility], and log in with your MyChart username and password.   Gemcitabine Injection What is this medication? GEMCITABINE (jem SYE ta been) treats some types of cancer. It works by slowing down the growth of cancer cells. This medicine may be used for other purposes; ask your health care provider or pharmacist if you have questions. COMMON BRAND NAME(S): Gemzar, Infugem What should I tell my care team before I take this medication? They need to know if you have any of these conditions: Blood disorders Infection Kidney disease Liver disease Lung or breathing disease, such as asthma or COPD Recent or ongoing radiation therapy An  unusual or allergic reaction to gemcitabine, other medications, foods, dyes, or preservatives If you or your partner are pregnant or trying to get pregnant Breast-feeding How should I use this medication? This medication is injected into a vein. It is given by your care team in a hospital or clinic setting. Talk to your care  team about the use of this medication in children. Special care may be needed. Overdosage: If you think you have taken too much of this medicine contact a poison control center or emergency room at once. NOTE: This medicine is only for you. Do not share this medicine with others. What if I miss a dose? Keep appointments for follow-up doses. It is important not to miss your dose. Call your care team if you are unable to keep an appointment. What may interact with this medication? Interactions have not been studied. This list may not describe all possible interactions. Give your health care provider a list of all the medicines, herbs, non-prescription drugs, or dietary supplements you use. Also tell them if you smoke, drink alcohol, or use illegal drugs. Some items may interact with your medicine. What should I watch for while using this medication? Your condition will be monitored carefully while you are receiving this medication. This medication may make you feel generally unwell. This is not uncommon, as chemotherapy can affect healthy cells as well as cancer cells. Report any side effects. Continue your course of treatment even though you feel ill unless your care team tells you to stop. In some cases, you may be given additional medications to help with side effects. Follow all directions for their use. This medication may increase your risk of getting an infection. Call your care team for advice if you get a fever, chills, sore throat, or other symptoms of a cold or flu. Do not treat yourself. Try to avoid being around people who are sick. This medication may increase your risk to bruise or bleed. Call your care team if you notice any unusual bleeding. Be careful brushing or flossing your teeth or using a toothpick because you may get an infection or bleed more easily. If you have any dental work done, tell your dentist you are receiving this medication. Avoid taking medications that contain  aspirin, acetaminophen, ibuprofen, naproxen, or ketoprofen unless instructed by your care team. These medications may hide a fever. Talk to your care team if you or your partner wish to become pregnant or think you might be pregnant. This medication can cause serious birth defects if taken during pregnancy and for 6 months after the last dose. A negative pregnancy test is required before starting this medication. A reliable form of contraception is recommended while taking this medication and for 6 months after the last dose. Talk to your care team about effective forms of contraception. Do not father a child while taking this medication and for 3 months after the last dose. Use a condom while having sex during this time period. Do not breastfeed while taking this medication and for at least 1 week after the last dose. This medication may cause infertility. Talk to your care team if you are concerned about your fertility. What side effects may I notice from receiving this medication? Side effects that you should report to your care team as soon as possible: Allergic reactions--skin rash, itching, hives, swelling of the face, lips, tongue, or throat Capillary leak syndrome--stomach or muscle pain, unusual weakness or fatigue, feeling faint or lightheaded, decrease in the amount of  urine, swelling of the ankles, hands, or feet, trouble breathing Infection--fever, chills, cough, sore throat, wounds that don't heal, pain or trouble when passing urine, general feeling of discomfort or being unwell Liver injury--right upper belly pain, loss of appetite, nausea, light-colored stool, dark yellow or brown urine, yellowing skin or eyes, unusual weakness or fatigue Low red blood cell level--unusual weakness or fatigue, dizziness, headache, trouble breathing Lung injury--shortness of breath or trouble breathing, cough, spitting up blood, chest pain, fever Stomach pain, bloody diarrhea, pale skin, unusual weakness or  fatigue, decrease in the amount of urine, which may be signs of hemolytic uremic syndrome Sudden and severe headache, confusion, change in vision, seizures, which may be signs of posterior reversible encephalopathy syndrome (PRES) Unusual bruising or bleeding Side effects that usually do not require medical attention (report to your care team if they continue or are bothersome): Diarrhea Drowsiness Hair loss Nausea Pain, redness, or swelling with sores inside the mouth or throat Vomiting This list may not describe all possible side effects. Call your doctor for medical advice about side effects. You may report side effects to FDA at 1-800-FDA-1088. Where should I keep my medication? This medication is given in a hospital or clinic. It will not be stored at home. NOTE: This sheet is a summary. It may not cover all possible information. If you have questions about this medicine, talk to your doctor, pharmacist, or health care provider.  2024 Elsevier/Gold Standard (2021-08-14 00:00:00)  Pegfilgrastim Injection What is this medication? PEGFILGRASTIM (PEG fil gra stim) lowers the risk of infection in people who are receiving chemotherapy. It works by Systems analyst make more white blood cells, which protects your body from infection. It may also be used to help people who have been exposed to high doses of radiation. This medicine may be used for other purposes; ask your health care provider or pharmacist if you have questions. COMMON BRAND NAME(S): Cherly Hensen, Neulasta, Nyvepria, Stimufend, UDENYCA, UDENYCA ONBODY, Ziextenzo What should I tell my care team before I take this medication? They need to know if you have any of these conditions: Kidney disease Latex allergy Ongoing radiation therapy Sickle cell disease Skin reactions to acrylic adhesives (On-Body Injector only) An unusual or allergic reaction to pegfilgrastim, filgrastim, other medications, foods, dyes, or  preservatives Pregnant or trying to get pregnant Breast-feeding How should I use this medication? This medication is for injection under the skin. If you get this medication at home, you will be taught how to prepare and give the pre-filled syringe or how to use the On-body Injector. Refer to the patient Instructions for Use for detailed instructions. Use exactly as directed. Tell your care team immediately if you suspect that the On-body Injector may not have performed as intended or if you suspect the use of the On-body Injector resulted in a missed or partial dose. It is important that you put your used needles and syringes in a special sharps container. Do not put them in a trash can. If you do not have a sharps container, call your pharmacist or care team to get one. Talk to your care team about the use of this medication in children. While this medication may be prescribed for selected conditions, precautions do apply. Overdosage: If you think you have taken too much of this medicine contact a poison control center or emergency room at once. NOTE: This medicine is only for you. Do not share this medicine with others. What if I miss a dose? It  is important not to miss your dose. Call your care team if you miss your dose. If you miss a dose due to an On-body Injector failure or leakage, a new dose should be administered as soon as possible using a single prefilled syringe for manual use. What may interact with this medication? Interactions have not been studied. This list may not describe all possible interactions. Give your health care provider a list of all the medicines, herbs, non-prescription drugs, or dietary supplements you use. Also tell them if you smoke, drink alcohol, or use illegal drugs. Some items may interact with your medicine. What should I watch for while using this medication? Your condition will be monitored carefully while you are receiving this medication. You may need blood  work done while you are taking this medication. Talk to your care team about your risk of cancer. You may be more at risk for certain types of cancer if you take this medication. If you are going to need a MRI, CT scan, or other procedure, tell your care team that you are using this medication (On-Body Injector only). What side effects may I notice from receiving this medication? Side effects that you should report to your care team as soon as possible: Allergic reactions--skin rash, itching, hives, swelling of the face, lips, tongue, or throat Capillary leak syndrome--stomach or muscle pain, unusual weakness or fatigue, feeling faint or lightheaded, decrease in the amount of urine, swelling of the ankles, hands, or feet, trouble breathing High white blood cell level--fever, fatigue, trouble breathing, night sweats, change in vision, weight loss Inflammation of the aorta--fever, fatigue, back, chest, or stomach pain, severe headache Kidney injury (glomerulonephritis)--decrease in the amount of urine, red or dark brown urine, foamy or bubbly urine, swelling of the ankles, hands, or feet Shortness of breath or trouble breathing Spleen injury--pain in upper left stomach or shoulder Unusual bruising or bleeding Side effects that usually do not require medical attention (report to your care team if they continue or are bothersome): Bone pain Pain in the hands or feet This list may not describe all possible side effects. Call your doctor for medical advice about side effects. You may report side effects to FDA at 1-800-FDA-1088. Where should I keep my medication? Keep out of the reach of children. If you are using this medication at home, you will be instructed on how to store it. Throw away any unused medication after the expiration date on the label. NOTE: This sheet is a summary. It may not cover all possible information. If you have questions about this medicine, talk to your doctor, pharmacist, or  health care provider.  2024 Elsevier/Gold Standard (2021-03-09 00:00:00)

## 2023-08-07 NOTE — Progress Notes (Signed)
 [facility] OFFICE PROGRESS NOTE  Patient Care Team: Valli Gaw, MD as PCP - General (Family Medicine) Drake Gens, RN as Oncology Nurse Navigator Loreatha Rodney, MD as Consulting Physician (Oncology) Lawerence Pressman, MD as Consulting Physician (Urology)  TREATMENT:  Right hip palliative RT 30 cGy completed 12/18/2021 Carboplatin, alimta and Keytruda x 4 cycles completed 02/19/22.  Discontinued maintenance alimta (04-23-22) due to worsening renal dysfunction, transaminitis and cytopenias Keytruda maintenance-discontinued on 09/17/2022 due to disease progression Palliative RT to left frontal lesion 5 fx completed 09/27/22 Palliative RT to left hip completed September 2024 Carboplatin (AUC 4) and paclitaxel 150 mg/m2 - x 5 cycles ( 10/07/2022- 01/21/2023). Maintenance Avastin 15 mg/kg- 03/27/2023- 04/24/23 Gemcitabine 800 mg/m day 1 day 8, started on 06/26/2023  ASSESSMENT & PLAN:   # Primary lung adenocarcinoma (HCC), Stage IV, PDL1 1% - Progressed through first-line [place] Alimta, Keytruda and then Keytruda maintenance on 09/17/2022.  - s/p second line carbo AUC 4 and Taxol 150 mg/m on 10/07/2022.  Completed 5 cycles and then discontinued due to severe cytopenias.  Patient agreed for maintenance Avastin (was not added with chemo because patient was hesitant).  Progressed through maintenance Avastin.  Last dose 04/24/2023.  - Foundation 1 from August 2023 no targets.  Liquid foundation 1 CDX from June 2024 with no targetable mutation.  Repeat lung biopsy on 12/17/2022 sent for foundation 1 CDX testing was limited due to inadequate sample but no targets seen.  RNA fusion panel was sent on primary biopsy from August 2023.  It could not be run due to low tumor purity.  HER2 IHC 0.  C-Met amplification negative by FISH.  Lab report polysomy 7.  - PET from 05/16/2023 showed disease progression in the bones.  Continue care coordination with Dr. Adron Horns at [facility].   - s/p C1D1 of gemcitabine  800 mg/m on 06/26/2023.  ANC 800.  Started on Neulasta support.  Platelets nadir at 50 and required about 3 weeks to recover.  Dose reduced gemcitabine to 650 mg/m last received on 07/17/2023. D8 delayed d/t ongoing thrombocytopenia. Now plan for gemcitabine on D1 & D15. Labs today reviewed and acceptable for treatment. Platelets 116. Proceed with gemcitabine.   # Constipation -Continue with Senokot, MiraLAX as needed.  # Cancer-related pain -Follows with palliative care. -Xtampza 9 mg twice daily with oxycodone IR 5 mg every 6 as needed -Flexeril 5 mg twice daily as needed for muscle stiffness -After spine RT, patient has completely stopped pain medication.  Reports her pain is manageable.  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  - Follow-up with Dr. Mark Sil.  Dr. Mark Sil discussed the case with Dr. Chrystal who plans to hold off on radiation at this time.  Since the growth is minimal and patient is asymptomatic. - Follow-up MRI brain scheduled in April 2025.  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 Cr stabilized.  -will continue to monitor.  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 Maida Sciara on 12/18/2021 -Completed palliative RT to left hip on 01/01/2023.   -s/p palliative RT to lumbar spine completed on 06/12/2023  # Metastatic bone lesions -Xgeva 120 mg subcu monthly (last dose on 07/17/2023) -Plan is for q1 to 3 months depending on the calcium levels.  # Poor appetite & Weight loss -Continue follow-up with nutrition. -Olanzapine-did not tolerate. -  Remeron 15 mg once daily. Weight up 1 lb.   # Limited mobility # Risk of fall - Referral to home health PT because patient has limited mobility from metastatic lung cancer to the bone causing pathological fractures, pain and she is at high  risk of fall.  - encouraged her to call to schedule.   # Access - port  RTC in 2 weeks for port/labs, MD, gemzar- la  Orders Placed This Encounter  Procedures   CBC with Differential (Cancer Center Only)    Standing Status:   Future    Expected Date:   09/04/2023    Expiration Date:   09/03/2024   CMP (Cancer Center only)    Standing Status:   Future    Expected Date:   09/04/2023    Expiration Date:   09/03/2024   CBC with Differential (Cancer Center Only)    Standing Status:   Future    Expected Date:   09/18/2023    Expiration Date:   09/17/2024   CMP (Cancer Center only)    Standing Status:   Future    Expected Date:   09/18/2023    Expiration Date:   09/17/2024    [facility]- 12/06/2021   All questions were answered. The patient knows to call the clinic with any problems, questions or concerns. The total time spent in the appointment was 30 minutes encounter with patients including review of chart and various tests results, discussions about plan of care and coordination of care plan   Victoria Dooms, NP 08/07/2023   INTERVAL HISTORY: Patient seen today as follow-up accompanied with daughter for management of stage IV lung adenocarcinoma. Treatment was held last week due to thrombocytopenia. She clinically feels at baseline. She has not started home PT yet. Appetite is stable. Drinking well. Denies pain.    REVIEW OF SYSTEMS:   Positive ROS as above.  Rest 10 points ROS negative   I have reviewed the past medical history, past surgical history, social history and family history with the patient and they are unchanged from previous note.  ALLERGIES:  is allergic to diclofenac, lisinopril, and sulfa antibiotics.  MEDICATIONS:  Current Outpatient Medications  Medication Sig Dispense Refill   albuterol (VENTOLIN HFA) 108 (90 Base) MCG/ACT inhaler Inhale 2 puffs into the lungs every 6 (six) hours as needed for wheezing or shortness of breath. 18 g 0   Azelastine HCl 137  MCG/SPRAY SOLN Place 2 sprays into both nostrils 2 (two) times daily.     Cholecalciferol (VITAMIN D-3 PO) Take by mouth.     cyclobenzaprine (FLEXERIL) 5 MG tablet Take 1 tablet (5 mg total) by mouth 2 (two) times daily as needed for muscle spasms. 30 tablet 0   fluticasone (FLONASE) 50 MCG/ACT nasal spray USE TWO SPRAYS IN EACH NOSTRIL ONCE DAILY prn 16 g 12   lactulose (CHRONULAC) 10 GM/15ML solution Take 15 mLs (10 g total) by mouth 3 (three) times daily. (Patient not taking: Reported on 07/31/2023) 236 mL 0   loratadine (CLARITIN) 10 MG tablet TAKE ONE TABLET BY MOUTH DAILY AS NEEDED FOR ALLERGIES 90 tablet 3   losartan (COZAAR) 100 MG tablet Take 1 tablet (100 mg total) by mouth daily. 90 tablet 3   magic mouthwash w/lidocaine SOLN Take 5 mLs by mouth 4 (four) times daily as needed for mouth pain. Sig: Swish/Swallow 5-10 ml four times a day as needed. Dispense 480 ml. 1RF (Patient not taking: Reported on 07/31/2023) 480 mL 1  mirtazapine (REMERON) 15 MG tablet Take 1 tablet (15 mg total) by mouth at bedtime. 30 tablet 0   naloxone (NARCAN) nasal spray 4 mg/0.1 mL SPRAY 1 SPRAY INTO ONE NOSTRIL AS DIRECTED FOR OPIOID OVERDOSE (TURN PERSON ON SIDE AFTER DOSE. IF NO RESPONSE IN 2-3 MINUTES OR PERSON RESPONDS BUT RELAPSES, REPEAT USING A NEW SPRAY DEVICE AND SPRAY INTO THE OTHER NOSTRIL. CALL 911 AFTER USE.) * EMERGENCY USE ONLY * (Patient not taking: Reported on 07/31/2023) 1 each 0   ondansetron (ZOFRAN) 8 MG tablet Take 1 tablet (8 mg total) by mouth every 8 (eight) hours as needed for nausea or vomiting. (Patient not taking: Reported on 07/31/2023) 30 tablet 3   oxyCODONE (OXY IR/ROXICODONE) 5 MG immediate release tablet Take 1-2 tablets (5-10 mg total) by mouth every 4 (four) hours as needed for severe pain (pain score 7-10). (Patient not taking: Reported on 07/03/2023) 60 tablet 0   oxyCODONE ER (XTAMPZA ER) 9 MG C12A Take 1 capsule by mouth every 12 (twelve) hours. 60 capsule 0   senna (SENOKOT)  8.6 MG TABS tablet Take 1 tablet by mouth daily.     No current facility-administered medications for this visit.   Facility-Administered Medications Ordered in Other Visits  Medication Dose Route Frequency Provider Last Rate Last Admin   0.9 %  sodium chloride infusion   Intravenous Continuous Agrawal, Kavita, MD   Stopped at 08/07/23 1247   heparin lock flush 100 UNIT/ML injection            heparin lock flush 100 unit/mL  500 Units Intravenous Once Agrawal, Kavita, MD       sodium chloride flush (NS) 0.9 % injection 10 mL  10 mL Intravenous Once Agrawal, Kavita, MD        SUMMARY OF ONCOLOGIC HISTORY: Oncology History  Primary lung adenocarcinoma (HCC)  10/10/2021 Initial Diagnosis   Primary lung adenocarcinoma (HCC) Stage IV  Patient seen by PCP for wheezing for 2 weeks. Imaging with CXR followed by CT chest showed Spiculated 2.1 x 2.2 cm left upper lobe pulmonary nodule with associated pleural tethering. Innumerable subcentimeter pulmonary nodules throughout the lungs.      11/14/2021 PET scan   IMPRESSION: 1. Left suprahilar nodule with maximum SUV 4.5, and a more distal 2.2 cm left upper lobe nodule with maximum SUV of 5.5. 2. Innumerable scattered small pulmonary nodules, most of which are under 5 mm in diameter, throughout both lungs. Some are cavitary. Possibilities include cavitating hematogenous disseminated malignancy versus infectious etiology subset as septic emboli. Most of the nodules are stable, some are minimally enlarged compared to 10/10/2021. 3. Abnormal mild permeative type bony findings in the right hemipelvis associated with substantially accentuated metabolic activity. Possible associated pathologic fracture through the right quadrilateral plate and right acetabulum. The findings involve the right ilium and ischium but also the right lateral sacral ala and a small focus in the left sacrum.    Pathology Results   She had transbronchial biopsies by Dr.  Viva Grise on 11/19/21.  Pathology from LUL nodule showed adenocarcinoma, consistent with lung primary.    11/19/2021 Cancer Staging   Staging form: Lung, AJCC 8th Edition - Clinical stage from 11/19/2021: Stage IVB (cT1c, cM1c) - Signed by Loreatha Rodney, MD on 11/27/2021 Stage prefix: Initial diagnosis   11/27/2021 Imaging   MRI pelvis  IMPRESSION: 1. Abnormal marrow signal throughout the right ilium involving the right acetabulum, right superior pubic ramus and right inferior pubic ramus, right side of the sacrum  and a smaller focus of abnormal marrow lesion in the left side of the sacrum. Findings are concerning for metastatic disease. 2. Nondisplaced pathologic fracture of the quadrilateral plate of the right acetabulum. 3. Mild muscle edema in the right gluteus minimus and medius muscles likely reflecting mild muscle strain.  MRI brain  IMPRESSION: No evidence of intracranial metastatic disease.   Indeterminate 1 cm left frontal calvarium lesion.   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  HENT:     Head: Normocephalic and atraumatic.  Cardiovascular:     Rate and Rhythm: Normal rate.  Pulmonary:     Effort: Pulmonary effort is normal.  Musculoskeletal:     Right lower leg: No edema.     Left lower leg: No edema.  Skin:    General: Skin is warm.  Neurological:     General: No focal deficit present.     Mental Status: She is oriented to person, place, and time.  Psychiatric:        Mood and Affect: Mood normal.     LABORATORY DATA:  I have reviewed the data as listed    Component Value Date/Time   NA 134 (L) 08/07/2023 1040   K 3.9 08/07/2023 1040   CL 107 08/07/2023 1040   CO2 20 (L) 08/07/2023 1040   GLUCOSE 144 (H) 08/07/2023 1040   BUN 21 08/07/2023 1040   CREATININE 1.24 (H) 08/07/2023 1040   CALCIUM 8.9 08/07/2023 1040   PROT 6.7 08/07/2023 1040   ALBUMIN 3.5 08/07/2023 1040   AST 19 08/07/2023 1040   ALT 12 08/07/2023 1040   ALKPHOS 175 (H) 08/07/2023 1040   BILITOT 0.6 08/07/2023 1040   GFRNONAA 47 (L) 08/07/2023 1040    No results found for: "SPEP", "UPEP"  Lab Results  Component Value Date   WBC 4.8 08/07/2023   NEUTROABS 3.7 08/07/2023   HGB 9.3 (L) 08/07/2023   HCT 29.2 (L) 08/07/2023   MCV 103.5 (H) 08/07/2023   PLT 116 (L) 08/07/2023      Chemistry      Component Value Date/Time   NA 134 (L) 08/07/2023 1040   K 3.9 08/07/2023 1040   CL  107 08/07/2023 1040   CO2 20 (L) 08/07/2023 1040   BUN 21 08/07/2023 1040   CREATININE 1.24 (H) 08/07/2023 1040      Component Value Date/Time   CALCIUM 8.9 08/07/2023 1040   ALKPHOS 175 (H) 08/07/2023 1040   AST 19 08/07/2023 1040   ALT 12 08/07/2023 1040   BILITOT 0.6 08/07/2023 1040       RADIOGRAPHIC STUDIES: I have personally  reviewed the radiological images as listed and agreed with the findings in the report. No results found.

## 2023-08-07 NOTE — Progress Notes (Signed)
 Patient has been having a headache for about a month now, she thinks it is due to the seasonal allergies, which she is taking zyrtec for the allergies. She has no new questions for the doctor today. Her appetite is about the same not that good but she is still drinking the ensure drinks everyday.

## 2023-08-08 ENCOUNTER — Ambulatory Visit (HOSPITAL_COMMUNITY): Payer: PPO

## 2023-08-08 ENCOUNTER — Encounter (HOSPITAL_COMMUNITY): Payer: Self-pay

## 2023-08-08 ENCOUNTER — Ambulatory Visit
Admission: RE | Admit: 2023-08-08 | Discharge: 2023-08-08 | Disposition: A | Discharge status: Home / Self Care | Source: Ambulatory Visit | Attending: Internal Medicine | Admitting: Internal Medicine

## 2023-08-08 ENCOUNTER — Encounter: Payer: Self-pay | Admitting: Internal Medicine

## 2023-08-08 ENCOUNTER — Telehealth: Payer: Self-pay | Admitting: *Deleted

## 2023-08-08 DIAGNOSIS — C7839 Secondary malignant neoplasm of other respiratory organs: Secondary | ICD-10-CM | POA: Diagnosis not present

## 2023-08-08 DIAGNOSIS — C7931 Secondary malignant neoplasm of brain: Secondary | ICD-10-CM | POA: Diagnosis not present

## 2023-08-08 DIAGNOSIS — C801 Malignant (primary) neoplasm, unspecified: Secondary | ICD-10-CM | POA: Diagnosis not present

## 2023-08-08 DIAGNOSIS — C7951 Secondary malignant neoplasm of bone: Secondary | ICD-10-CM | POA: Diagnosis not present

## 2023-08-08 MED ORDER — GADOBUTROL 1 MMOL/ML IV SOLN
6.0000 mL | Freq: Once | INTRAVENOUS | Status: AC | PRN
  Administered 2023-08-08: 6 mL via INTRAVENOUS

## 2023-08-11 ENCOUNTER — Encounter: Payer: Self-pay | Admitting: Internal Medicine

## 2023-08-11 NOTE — Telephone Encounter (Signed)
 On Friday 08/08/2023- I refaxed the [facility] referral to [facility]

## 2023-08-12 ENCOUNTER — Telehealth: Payer: Self-pay | Admitting: *Deleted

## 2023-08-12 NOTE — Telephone Encounter (Signed)
 Victoria Foley called because that the patient orders was sent 4/18 and the patient does not want to start this PT until 4/24. We just wanted to make sure that the patient wanted to start on that day.

## 2023-08-14 ENCOUNTER — Encounter: Payer: Self-pay | Admitting: Internal Medicine

## 2023-08-14 DIAGNOSIS — G893 Neoplasm related pain (acute) (chronic): Secondary | ICD-10-CM | POA: Diagnosis not present

## 2023-08-14 DIAGNOSIS — K8020 Calculus of gallbladder without cholecystitis without obstruction: Secondary | ICD-10-CM | POA: Diagnosis not present

## 2023-08-14 DIAGNOSIS — Z9181 History of falling: Secondary | ICD-10-CM | POA: Diagnosis not present

## 2023-08-14 DIAGNOSIS — C7951 Secondary malignant neoplasm of bone: Secondary | ICD-10-CM | POA: Diagnosis not present

## 2023-08-14 DIAGNOSIS — C3412 Malignant neoplasm of upper lobe, left bronchus or lung: Secondary | ICD-10-CM | POA: Diagnosis not present

## 2023-08-14 DIAGNOSIS — D6481 Anemia due to antineoplastic chemotherapy: Secondary | ICD-10-CM | POA: Diagnosis not present

## 2023-08-14 DIAGNOSIS — D701 Agranulocytosis secondary to cancer chemotherapy: Secondary | ICD-10-CM | POA: Diagnosis not present

## 2023-08-14 DIAGNOSIS — D6959 Other secondary thrombocytopenia: Secondary | ICD-10-CM | POA: Diagnosis not present

## 2023-08-14 DIAGNOSIS — N189 Chronic kidney disease, unspecified: Secondary | ICD-10-CM | POA: Diagnosis not present

## 2023-08-14 DIAGNOSIS — Z7952 Long term (current) use of systemic steroids: Secondary | ICD-10-CM | POA: Diagnosis not present

## 2023-08-14 DIAGNOSIS — D631 Anemia in chronic kidney disease: Secondary | ICD-10-CM | POA: Diagnosis not present

## 2023-08-14 DIAGNOSIS — T451X5D Adverse effect of antineoplastic and immunosuppressive drugs, subsequent encounter: Secondary | ICD-10-CM | POA: Diagnosis not present

## 2023-08-14 DIAGNOSIS — D630 Anemia in neoplastic disease: Secondary | ICD-10-CM | POA: Diagnosis not present

## 2023-08-14 DIAGNOSIS — C7931 Secondary malignant neoplasm of brain: Secondary | ICD-10-CM | POA: Diagnosis not present

## 2023-08-15 ENCOUNTER — Telehealth: Payer: Self-pay | Admitting: *Deleted

## 2023-08-15 ENCOUNTER — Encounter: Payer: Self-pay | Admitting: Internal Medicine

## 2023-08-15 ENCOUNTER — Other Ambulatory Visit: Payer: Self-pay | Admitting: *Deleted

## 2023-08-15 ENCOUNTER — Other Ambulatory Visit: Payer: Self-pay

## 2023-08-15 ENCOUNTER — Inpatient Hospital Stay (HOSPITAL_BASED_OUTPATIENT_CLINIC_OR_DEPARTMENT_OTHER): Payer: PPO | Admitting: Internal Medicine

## 2023-08-15 DIAGNOSIS — C7931 Secondary malignant neoplasm of brain: Secondary | ICD-10-CM

## 2023-08-15 DIAGNOSIS — C3490 Malignant neoplasm of unspecified part of unspecified bronchus or lung: Secondary | ICD-10-CM

## 2023-08-15 NOTE — Progress Notes (Signed)
 I connected with Victoria Foley on 08/15/23 at 11:30 AM EDT by telephone visit and verified that I am speaking with the correct person using two identifiers.  I discussed the limitations, risks, security and privacy concerns of performing an evaluation and management service by telemedicine and the availability of in-person appointments. I also discussed with the patient that there may be a patient responsible charge related to this service. The patient expressed understanding and agreed to proceed.   Other persons participating in the visit and their role in the encounter:  n/a  Patient's location:  Home Provider's location:  Office Chief Complaint:  Metastasis to brain [facility])  History of Present Ilness: Victoria Foley reports no clinical changes today.  Denies seizurues, headaches.  Continues to undergo chemotherapy, avastin  with Dr. Aris Bel, now on gemcitabine .    Observations: Language and cognition at baseline  Imaging:  [facility] Clinician Interpretation: I have personally reviewed the CNS images as listed.  My interpretation, in the context of the patient's clinical presentation, is progressive disease pending official read  No results found.  Assessment and Plan: Metastasis to brain [facility])  Victoria Foley is clinically stable today.  MRI brain demonstrates progression of yet untreated left temporal metastasis.  Official read is pending.    We discussed and recommended re-referral to radiation oncology for focal radiation.  She is agreeable with this.  No corticosteroids for now given lack of symptoms.  Follow Up Instructions: We ask that Victoria Foley return to clinic in 3 months following next brain MRI, or sooner as needed.  I discussed the assessment and treatment plan with the patient.  The patient was provided an opportunity to ask questions and all were answered.  The patient agreed with the plan and demonstrated understanding of the instructions.    The patient was  advised to call back or seek an in-person evaluation if the symptoms worsen or if the condition fails to improve as anticipated.    [place] Rollyson K Tera Pellicane, MD   I provided 20 minutes of non face-to-face telephone visit time during this encounter, and > 50% was spent counseling as documented under my assessment & plan.

## 2023-08-15 NOTE — Telephone Encounter (Signed)
 MD is ok for the patient to get PT at home with [facility]. I called back to Amy and  I left message to her at [phone] and said that  it is fine to do PT

## 2023-08-21 ENCOUNTER — Encounter: Payer: Self-pay | Admitting: *Deleted

## 2023-08-21 ENCOUNTER — Other Ambulatory Visit: Payer: Self-pay | Admitting: *Deleted

## 2023-08-21 ENCOUNTER — Inpatient Hospital Stay: Attending: Internal Medicine

## 2023-08-21 ENCOUNTER — Inpatient Hospital Stay

## 2023-08-21 ENCOUNTER — Encounter: Payer: Self-pay | Admitting: Radiation Oncology

## 2023-08-21 ENCOUNTER — Ambulatory Visit
Admission: RE | Admit: 2023-08-21 | Discharge: 2023-08-21 | Disposition: A | Discharge status: Home / Self Care | Source: Ambulatory Visit | Attending: Radiation Oncology | Admitting: Radiation Oncology

## 2023-08-21 ENCOUNTER — Encounter: Payer: Self-pay | Admitting: Internal Medicine

## 2023-08-21 ENCOUNTER — Inpatient Hospital Stay (HOSPITAL_BASED_OUTPATIENT_CLINIC_OR_DEPARTMENT_OTHER): Admitting: Internal Medicine

## 2023-08-21 VITALS — BP 115/80 | HR 101 | Temp 97.3°F | Resp 16 | Wt 131.0 lb

## 2023-08-21 DIAGNOSIS — R630 Anorexia: Secondary | ICD-10-CM | POA: Diagnosis not present

## 2023-08-21 DIAGNOSIS — Z8616 Personal history of COVID-19: Secondary | ICD-10-CM | POA: Diagnosis not present

## 2023-08-21 DIAGNOSIS — T451X5A Adverse effect of antineoplastic and immunosuppressive drugs, initial encounter: Secondary | ICD-10-CM | POA: Diagnosis not present

## 2023-08-21 DIAGNOSIS — I129 Hypertensive chronic kidney disease with stage 1 through stage 4 chronic kidney disease, or unspecified chronic kidney disease: Secondary | ICD-10-CM | POA: Insufficient documentation

## 2023-08-21 DIAGNOSIS — M25552 Pain in left hip: Secondary | ICD-10-CM | POA: Diagnosis not present

## 2023-08-21 DIAGNOSIS — C7951 Secondary malignant neoplasm of bone: Secondary | ICD-10-CM | POA: Insufficient documentation

## 2023-08-21 DIAGNOSIS — D6481 Anemia due to antineoplastic chemotherapy: Secondary | ICD-10-CM | POA: Diagnosis not present

## 2023-08-21 DIAGNOSIS — Z5111 Encounter for antineoplastic chemotherapy: Secondary | ICD-10-CM | POA: Insufficient documentation

## 2023-08-21 DIAGNOSIS — C3490 Malignant neoplasm of unspecified part of unspecified bronchus or lung: Secondary | ICD-10-CM | POA: Diagnosis not present

## 2023-08-21 DIAGNOSIS — D6959 Other secondary thrombocytopenia: Secondary | ICD-10-CM

## 2023-08-21 DIAGNOSIS — M533 Sacrococcygeal disorders, not elsewhere classified: Secondary | ICD-10-CM | POA: Diagnosis not present

## 2023-08-21 DIAGNOSIS — Z923 Personal history of irradiation: Secondary | ICD-10-CM | POA: Insufficient documentation

## 2023-08-21 DIAGNOSIS — C3412 Malignant neoplasm of upper lobe, left bronchus or lung: Secondary | ICD-10-CM | POA: Diagnosis not present

## 2023-08-21 DIAGNOSIS — Z79899 Other long term (current) drug therapy: Secondary | ICD-10-CM | POA: Diagnosis not present

## 2023-08-21 DIAGNOSIS — K5900 Constipation, unspecified: Secondary | ICD-10-CM | POA: Diagnosis not present

## 2023-08-21 DIAGNOSIS — Z7952 Long term (current) use of systemic steroids: Secondary | ICD-10-CM | POA: Diagnosis not present

## 2023-08-21 DIAGNOSIS — N189 Chronic kidney disease, unspecified: Secondary | ICD-10-CM | POA: Insufficient documentation

## 2023-08-21 DIAGNOSIS — C7931 Secondary malignant neoplasm of brain: Secondary | ICD-10-CM | POA: Diagnosis not present

## 2023-08-21 DIAGNOSIS — M25551 Pain in right hip: Secondary | ICD-10-CM

## 2023-08-21 DIAGNOSIS — G893 Neoplasm related pain (acute) (chronic): Secondary | ICD-10-CM

## 2023-08-21 DIAGNOSIS — Z510 Encounter for antineoplastic radiation therapy: Secondary | ICD-10-CM | POA: Insufficient documentation

## 2023-08-21 DIAGNOSIS — D696 Thrombocytopenia, unspecified: Secondary | ICD-10-CM | POA: Insufficient documentation

## 2023-08-21 LAB — CMP (CANCER CENTER ONLY)
ALT: 12 U/L (ref 0–44)
AST: 18 U/L (ref 15–41)
Albumin: 3.5 g/dL (ref 3.5–5.0)
Alkaline Phosphatase: 225 U/L — ABNORMAL HIGH (ref 38–126)
Anion gap: 8 (ref 5–15)
BUN: 15 mg/dL (ref 8–23)
CO2: 20 mmol/L — ABNORMAL LOW (ref 22–32)
Calcium: 8.2 mg/dL — ABNORMAL LOW (ref 8.9–10.3)
Chloride: 106 mmol/L (ref 98–111)
Creatinine: 1.08 mg/dL — ABNORMAL HIGH (ref 0.44–1.00)
GFR, Estimated: 55 mL/min — ABNORMAL LOW (ref >60)
Glucose, Bld: 130 mg/dL — ABNORMAL HIGH (ref 70–99)
Potassium: 3.9 mmol/L (ref 3.5–5.1)
Sodium: 134 mmol/L — ABNORMAL LOW (ref 135–145)
Total Bilirubin: 0.5 mg/dL (ref 0.0–1.2)
Total Protein: 6.9 g/dL (ref 6.5–8.1)

## 2023-08-21 LAB — CBC WITH DIFFERENTIAL (CANCER CENTER ONLY)
Abs Immature Granulocytes: 0.65 10*3/uL — ABNORMAL HIGH (ref 0.00–0.07)
Basophils Absolute: 0 10*3/uL (ref 0.0–0.1)
Basophils Relative: 0 %
Eosinophils Absolute: 0 10*3/uL (ref 0.0–0.5)
Eosinophils Relative: 0 %
HCT: 26.9 % — ABNORMAL LOW (ref 36.0–46.0)
Hemoglobin: 8.8 g/dL — ABNORMAL LOW (ref 12.0–15.0)
Immature Granulocytes: 6 %
Lymphocytes Relative: 8 %
Lymphs Abs: 0.9 10*3/uL (ref 0.7–4.0)
MCH: 34 pg (ref 26.0–34.0)
MCHC: 32.7 g/dL (ref 30.0–36.0)
MCV: 103.9 fL — ABNORMAL HIGH (ref 80.0–100.0)
Monocytes Absolute: 0.5 10*3/uL (ref 0.1–1.0)
Monocytes Relative: 5 %
Neutro Abs: 9.3 10*3/uL — ABNORMAL HIGH (ref 1.7–7.7)
Neutrophils Relative %: 81 %
Platelet Count: 97 10*3/uL — ABNORMAL LOW (ref 150–400)
RBC: 2.59 MIL/uL — ABNORMAL LOW (ref 3.87–5.11)
RDW: 17 % — ABNORMAL HIGH (ref 11.5–15.5)
WBC Count: 11.4 10*3/uL — ABNORMAL HIGH (ref 4.0–10.5)
nRBC: 0.3 % — ABNORMAL HIGH (ref 0.0–0.2)

## 2023-08-21 MED ORDER — HEPARIN SOD (PORK) LOCK FLUSH 100 UNIT/ML IV SOLN
500.0000 [IU] | Freq: Once | INTRAVENOUS | Status: AC | PRN
  Administered 2023-08-21: 500 [IU]
  Filled 2023-08-21: qty 5

## 2023-08-21 MED ORDER — PROCHLORPERAZINE MALEATE 10 MG PO TABS
10.0000 mg | ORAL_TABLET | Freq: Once | ORAL | Status: AC
  Administered 2023-08-21: 10 mg via ORAL
  Filled 2023-08-21: qty 1

## 2023-08-21 MED ORDER — SODIUM CHLORIDE 0.9 % IV SOLN
INTRAVENOUS | Status: DC
Start: 2023-08-21 — End: 2023-08-21
  Filled 2023-08-21: qty 250

## 2023-08-21 MED ORDER — PEGFILGRASTIM 6 MG/0.6ML ~~LOC~~ PSKT
6.0000 mg | PREFILLED_SYRINGE | Freq: Once | SUBCUTANEOUS | Status: AC
  Administered 2023-08-21: 6 mg via SUBCUTANEOUS
  Filled 2023-08-21: qty 0.6

## 2023-08-21 MED ORDER — SODIUM CHLORIDE 0.9 % IV SOLN
650.0000 mg/m2 | Freq: Once | INTRAVENOUS | Status: AC
  Administered 2023-08-21: 1026 mg via INTRAVENOUS
  Filled 2023-08-21: qty 26.98

## 2023-08-21 NOTE — Progress Notes (Signed)
 Radiation Oncology Follow up Note  Name: Victoria Foley   Date:   08/21/2023 MRN:  [id number] DOB: 17-Oct-1950    This 73 y.o. female presents to the clinic today for evaluation of left temporal occipital brain metastasis and patient treated multiple times with palliative radiation therapy for stage IV adenocarcinoma of the lung.  REFERRING PROVIDER: Valli Gaw, MD  HPI: Patient is a 73 year old female well-known to department have received multiple courses radiation therapy to both her spine extremities as well as left frontal skull for metastatic stage IV adenocarcinoma of the lung..  She had a recent MRI scan which shows mild growth of a left temporal occipital metastasis with interval mild adjacent brain edema.  No other new brain metastasis were noted.  She does have some new dural thickening along the bilateral greater sphenoid wing deposits the left side deposit encroaches on the lateral left orbit with mild relative proptosis.  She is fairly asymptomatic although in the last several days has had a headache.  She has a PET/CT scheduled for 12 May.  Patient specifically denies any focal neurologic deficits.  COMPLICATIONS OF TREATMENT: none  FOLLOW UP COMPLIANCE: keeps appointments   PHYSICAL EXAM:  There were no vitals taken for this visit. Wheelchair-bound female in NAD.  Well-developed well-nourished patient in NAD. HEENT reveals PERLA, EOMI, discs not visualized.  Oral cavity is clear. No oral mucosal lesions are identified. Neck is clear without evidence of cervical or supraclavicular adenopathy. Lungs are clear to A&P. Cardiac examination is essentially unremarkable with regular rate and rhythm without murmur rub or thrill. Abdomen is benign with no organomegaly or masses noted. Motor sensory and DTR levels are equal and symmetric in the upper and lower extremities. Cranial nerves II through XII are grossly intact. Proprioception is intact. No peripheral adenopathy or edema is  identified. No motor or sensory levels are noted. Crude visual fields are within normal range.  RADIOLOGY RESULTS: MRI scans reviewed CT scans reviewed pending PET CT scan  PLAN: At this time like to review her PET CT scan performed before proceeding to [facility] to the left occipital lesion.  Will plan on doing 55 Martina Sledge in a single fraction to this lesion.  Although I do not think PET will alter my treatment approach to this patient I would like to have that information prior to proceeding with simulation.  I have set her up for simulation right after her PET CT scan is performed.  Risks and benefits of treatment clued possible hair loss fatigue alteration of blood counts skin reaction all reviewed in detail with the patient.  Her and her daughter both comprehend my treatment plan well.  I would like to take this opportunity to thank you for allowing me to participate in the care of your patient.Victoria Langdon, MD

## 2023-08-21 NOTE — Progress Notes (Addendum)
  [facility]cer Center OFFICE PROGRESS NOTE  Patient Care Team: Valli Gaw, MD as PCP - General (Family Medicine) Drake Gens, RN as Oncology Nurse Navigator Loreatha Rodney, MD as Consulting Physician (Oncology) Lawerence Pressman, MD as Consulting Physician (Urology)  TREATMENT:  Right hip palliative RT 30 cGy completed 12/18/2021 Carboplatin , alimta and Keytruda  x 4 cycles completed 02/19/22.  Discontinued maintenance alimta (04-23-22) due to worsening renal dysfunction, transaminitis and cytopenias Keytruda  maintenance-discontinued on 09/17/2022 due to disease progression Palliative RT to left frontal lesion 5 fx completed 09/27/22 Palliative RT to left hip completed September 2024 Carboplatin  (AUC 4) and paclitaxel  150 mg/m2 - x 5 cycles ( 10/07/2022- 01/21/2023). Maintenance Avastin  15 mg/kg- 03/27/2023- 04/24/23 Gemcitabine  800 mg/m day 1 day 8, started on 06/26/2023.  Dose reduced to 650 mg/m due to myelosuppression.  ASSESSMENT & PLAN:   # Primary lung adenocarcinoma (HCC), Stage IV, PDL1 1% -Progressed through first-line carbo Alimta, Keytruda  and then Keytruda  maintenance on 09/17/2022.  -s/p second line carbo AUC 4 and Taxol  150 mg/m on 10/07/2022.  Completed 5 cycles and then discontinued due to severe cytopenias.  Patient agreed for maintenance Avastin  (was not added with chemo because patient was hesitant).  Progressed through maintenance Avastin .  Last dose 04/24/2023.  -Foundation 1 from August 2023 no targets.  Liquid foundation 1 CDX from June 2024 with no targetable mutation.  Repeat lung biopsy on 12/17/2022 sent for foundation 1 CDX testing was limited due to inadequate sample but no targets seen.  RNA fusion panel was sent on primary biopsy from August 2023.  It could not be run due to low tumor purity.  HER2 IHC 0.  C-Met amplification negative by FISH.  Lab report polysomy 7.  -PET from 05/16/2023 showed disease progression in the bones.  Continue care coordination with Dr.  Adron Horns at [facility].   -s/p C1D1 of gemcitabine  800 mg/m on 06/26/2023.  ANC 800.  Started on Neulasta  support.  Platelets nadir at 50 and required 3 weeks to recover.  Patient is heavily myelosuppressed from prior chemo and radiation.  Dose reduced gemcitabine  to 650 mg/m (on 07/17/2023) every 2-week regimen.  Labs reviewed.  Hemoglobin 8.8 and platelets of 97 noted.  Will proceed with cycle 4 of gemcitabine  and Neulasta  on pro.  Schedule for PET scan in 2 weeks to reassess.  Her bone disease is better visualized on PET versus regular CT scan/ bone scan.  # Constipation -continue with Senokot, MiraLAX as needed.  # Cancer-related pain -Follows with palliative care. -Xtampza  9 mg twice daily with oxycodone  IR 5 mg every 6 as needed -After spine RT, patient has completely stopped pain medication.  Reports her pain is manageable.  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   -Continue to follow up with Dr. Mark Sil.  Recent MRI brain from 08/08/2023 showed mild interval progression of chronic left temporal occipital metastasis with mild adjacent brain edema.  Progressive skull base osseous metastatic disease with new dural thickening along the bilateral greater sphenoid wing deposit.    - She is scheduled to follow-up with Dr. Jacalyn Martin this afternoon to evaluate potential radiation treatment for left temporal occipital metastasis.  She has been having headaches for the past few days not improved with Xtampza .  Started on Decadron  by [employer].  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 Cr stabilized.  -will continue to monitor.  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   -s/p palliative RT to lumbar spine completed on 06/12/2023. All for palliative pain  control.   # Metastatic bone lesions -Xgeva  120 mg subcu monthly (last dose on 07/17/2023) -Plan is for q1 to 3 months depending on the calcium levels.  Calcium is low today at 8.2.  Continue with calcium vitamin D  supplements.  Scheduled for Xgeva  with next treatment.  # Poor appetite # Weight loss -Continue follow-up with nutrition. -Olanzapine -did not tolerate.  Tells me he does not like the way she feels in her brain - Has not been taking Remeron .  # Limited mobility # Risk of fall - Has started home PT  # Access - port  RTC 2 weeks with Dr. Randy Buttery, labs, Gemzar , xgeva   Orders Placed This Encounter  Procedures   NM PET Image Restag (PS) Skull Base To Thigh    Standing Status:   Future    Expected Date:   08/28/2023    Expiration Date:   08/20/2024    If indicated for the ordered procedure, I authorize the administration of a radiopharmaceutical per Radiology protocol:   Yes    Preferred imaging location?:   [facility]   Hold Tube- Blood Bank    Standing Status:   Future    Expected Date:   09/03/2023    Expiration Date:   08/20/2024      All questions were answered. The patient knows to call the clinic with any problems, questions or concerns. The total time spent in the appointment was 30 minutes encounter with patients including review of chart and various tests results, discussions about plan of care and coordination of care plan   Loreatha Rodney, MD 08/21/2023 12:18 PM  INTERVAL HISTORY: Patient seen today as follow-up accompanied with daughter for management of stage IV lung adenocarcinoma. Patient reports headache for the past few days.  Took Xtampza  12-hour regimen which did not help with the pain.  She has been having sneezing, postnasal drip, runny nose.  Denies any fever, shortness of breath, chest pain.  Does report history of allergies.  Was previously taking Zyrtec but now has stopped.  She is feeling nauseous and had 1 episode of vomiting yesterday and 1 today.   She has been constipated and not using any bowel regimen.  Appetite is poor.  Does not prefer to use stimulants.  REVIEW OF SYSTEMS:   Positive ROS as above.  Rest 10 points ROS negative   I have reviewed the past medical history, past surgical history, social history and family history with the patient and they are unchanged from previous note.  ALLERGIES:  is allergic to diclofenac, lisinopril, and sulfa  antibiotics.  MEDICATIONS:  Current Outpatient Medications  Medication Sig Dispense Refill   albuterol  (VENTOLIN  HFA) 108 (90 Base) MCG/ACT inhaler Inhale 2 puffs into the lungs every 6 (six) hours as needed for wheezing or shortness of breath. 18 g 0   Azelastine  HCl 137 MCG/SPRAY SOLN Place 2 sprays into both nostrils 2 (two) times daily.     Cholecalciferol (VITAMIN D -3 PO) Take by mouth.     cyclobenzaprine  (FLEXERIL ) 5 MG tablet Take 1 tablet (5 mg total) by mouth 2 (two) times daily as needed for muscle spasms. 30 tablet 0   fluticasone  (FLONASE ) 50 MCG/ACT nasal spray USE TWO SPRAYS IN EACH NOSTRIL ONCE DAILY prn 16 g 12   lactulose  (  CHRONULAC ) 10 GM/15ML solution Take 15 mLs (10 g total) by mouth 3 (three) times daily. 236 mL 0   loratadine  (CLARITIN ) 10 MG tablet TAKE ONE TABLET BY MOUTH DAILY AS NEEDED FOR ALLERGIES 90 tablet 3   oxyCODONE  ER (XTAMPZA  ER) 9 MG C12A Take 1 capsule by mouth every 12 (twelve) hours. 60 capsule 0   losartan  (COZAAR ) 100 MG tablet Take 1 tablet (100 mg total) by mouth daily. 90 tablet 3   magic mouthwash w/lidocaine  SOLN Take 5 mLs by mouth 4 (four) times daily as needed for mouth pain. Sig: Swish/Swallow 5-10 ml four times a day as needed. Dispense 480 ml. 1RF 480 mL 1   mirtazapine  (REMERON ) 15 MG tablet Take 1 tablet (15 mg total) by mouth at bedtime. 30 tablet 0   naloxone  (NARCAN ) nasal spray 4 mg/0.1 mL SPRAY 1 SPRAY INTO ONE NOSTRIL AS DIRECTED FOR OPIOID OVERDOSE (TURN PERSON ON SIDE AFTER DOSE. IF NO RESPONSE IN 2-3 MINUTES OR PERSON RESPONDS  BUT RELAPSES, REPEAT USING A NEW SPRAY DEVICE AND SPRAY INTO THE OTHER NOSTRIL. CALL 911 AFTER USE.) * EMERGENCY USE ONLY * 1 each 0   ondansetron  (ZOFRAN ) 8 MG tablet Take 1 tablet (8 mg total) by mouth every 8 (eight) hours as needed for nausea or vomiting. 30 tablet 3   oxyCODONE  (OXY IR/ROXICODONE ) 5 MG immediate release tablet Take 1-2 tablets (5-10 mg total) by mouth every 4 (four) hours as needed for severe pain (pain score 7-10). 60 tablet 0   senna (SENOKOT) 8.6 MG TABS tablet Take 1 tablet by mouth daily.     No current facility-administered medications for this visit.   Facility-Administered Medications Ordered in Other Visits  Medication Dose Route Frequency Provider Last Rate Last Admin   0.9 %  sodium chloride  infusion   Intravenous Continuous Krishawn Vanderweele, MD   Stopped at 08/21/23 1209   heparin  lock flush 100 UNIT/ML injection            heparin  lock flush 100 unit/mL  500 Units Intravenous Once Temesgen Weightman, MD       sodium chloride  flush (NS) 0.9 % injection 10 mL  10 mL Intravenous Once Zayonna Ayuso, MD        SUMMARY OF ONCOLOGIC HISTORY: Oncology History  Primary lung adenocarcinoma (HCC)  10/10/2021 Initial Diagnosis   Primary lung adenocarcinoma (HCC) Stage IV  Patient seen by PCP for wheezing for 2 weeks. Imaging with CXR followed by CT chest showed Spiculated 2.1 x 2.2 cm left upper lobe pulmonary nodule with associated pleural tethering. Innumerable subcentimeter pulmonary nodules throughout the lungs.      11/14/2021 PET scan   IMPRESSION: 1. Left suprahilar nodule with maximum SUV 4.5, and a more distal 2.2 cm left upper lobe nodule with maximum SUV of 5.5. 2. Innumerable scattered small pulmonary nodules, most of which are under 5 mm in diameter, throughout both lungs. Some are cavitary. Possibilities include cavitating hematogenous disseminated malignancy versus infectious etiology subset as septic emboli. Most of the nodules are stable, some are  minimally enlarged compared to 10/10/2021. 3. Abnormal mild permeative type bony findings in the right hemipelvis associated with substantially accentuated metabolic activity. Possible associated pathologic fracture through the right quadrilateral plate and right acetabulum. The findings involve the right ilium and ischium but also the right lateral sacral ala and a small focus in the left sacrum.    Pathology Results   She had transbronchial biopsies by Dr. Viva Grise on 11/19/21.  Pathology from  LUL nodule showed adenocarcinoma, consistent with lung primary.    11/19/2021 Cancer Staging   Staging form: Lung, AJCC 8th Edition - Clinical stage from 11/19/2021: Stage IVB (cT1c, cM1c) - Signed by Loreatha Rodney, MD on 11/27/2021 Stage prefix: Initial diagnosis   11/27/2021 Imaging   MRI pelvis  IMPRESSION: 1. Abnormal marrow signal throughout the right ilium involving the right acetabulum, right superior pubic ramus and right inferior pubic ramus, right side of the sacrum and a smaller focus of abnormal marrow lesion in the left side of the sacrum. Findings are concerning for metastatic disease. 2. Nondisplaced pathologic fracture of the quadrilateral plate of the right acetabulum. 3. Mild muscle edema in the right gluteus minimus and medius muscles likely reflecting mild muscle strain.  MRI brain  IMPRESSION: No evidence of intracranial metastatic disease.   Indeterminate 1 cm left frontal calvarium lesion.   12/18/2021 - 08/27/2022 Chemotherapy   Patient is on Treatment Plan : LUNG Carboplatin  (5) + Pemetrexed  (500) + Pembrolizumab  (200) D1 q21d Induction x 4 cycles / Maintenance Pemetrexed  (500) + Pembrolizumab  (200) D1 q21d     10/07/2022 - 01/21/2023 Chemotherapy   Patient is on Treatment Plan : LUNG Carboplatin  + Paclitaxel  q21d Dose Reduction     03/27/2023 - 04/24/2023 Chemotherapy   Patient is on Treatment Plan : LUNG Bevacizumab  q21d     06/26/2023 -  Chemotherapy   Patient is on  Treatment Plan : LUNG Gemcitabine  (1000) D1,8 q21d     Primary malignant neoplasm of left upper lobe of lung (HCC)  06/04/2022 Initial Diagnosis   Primary malignant neoplasm of left upper lobe of lung (HCC)   06/04/2022 Cancer Staging   Staging form: Lung, AJCC 8th Edition - Pathologic: Stage IVB (pT1c, pNX, cM1c) - Signed by Gwyn Leos, MD on 06/04/2022     PHYSICAL EXAMINATION: ECOG PERFORMANCE STATUS: 2 - Symptomatic, <50% confined to bed  Vitals:   08/21/23 1010  BP: 115/80  Pulse: (!) 101  Resp: 16  Temp: (!) 97.3 F (36.3 C)  SpO2: 99%    Filed Weights   08/21/23 1010  Weight: 131 lb (59.4 kg)      Physical Exam Constitutional:      Appearance: Normal appearance.  HENT:     Head: Normocephalic and atraumatic.  Cardiovascular:     Rate and Rhythm: Normal rate.  Pulmonary:     Effort: Pulmonary effort is normal.  Musculoskeletal:     Right lower leg: No edema.     Left lower leg: No edema.  Skin:    General: Skin is warm.  Neurological:     General: No focal deficit present.     Mental Status: She is oriented to person, place, and time.  Psychiatric:        Mood and Affect: Mood normal.     LABORATORY DATA:  I have reviewed the data as listed    Component Value Date/Time   NA 134 (L) 08/21/2023 0949   K 3.9 08/21/2023 0949   CL 106 08/21/2023 0949   CO2 20 (L) 08/21/2023 0949   GLUCOSE 130 (H) 08/21/2023 0949   BUN 15 08/21/2023 0949   CREATININE 1.08 (H) 08/21/2023 0949   CALCIUM 8.2 (L) 08/21/2023 0949   PROT 6.9 08/21/2023 0949   ALBUMIN 3.5 08/21/2023 0949   AST 18 08/21/2023 0949   ALT 12 08/21/2023 0949   ALKPHOS 225 (H) 08/21/2023 0949   BILITOT 0.5 08/21/2023 0949   GFRNONAA 55 (L) 08/21/2023 1610  No results found for: "SPEP", "UPEP"  Lab Results  Component Value Date   WBC 11.4 (H) 08/21/2023   NEUTROABS 9.3 (H) 08/21/2023   HGB 8.8 (L) 08/21/2023   HCT 26.9 (L) 08/21/2023   MCV 103.9 (H) 08/21/2023   PLT 97  (L) 08/21/2023      Chemistry      Component Value Date/Time   NA 134 (L) 08/21/2023 0949   K 3.9 08/21/2023 0949   CL 106 08/21/2023 0949   CO2 20 (L) 08/21/2023 0949   BUN 15 08/21/2023 0949   CREATININE 1.08 (H) 08/21/2023 0949      Component Value Date/Time   CALCIUM 8.2 (L) 08/21/2023 0949   ALKPHOS 225 (H) 08/21/2023 0949   AST 18 08/21/2023 0949   ALT 12 08/21/2023 0949   BILITOT 0.5 08/21/2023 0949       RADIOGRAPHIC STUDIES: I have personally reviewed the radiological images as listed and agreed with the findings in the report. MR BRAIN W WO CONTRAST Result Date: 08/16/2023 CLINICAL DATA:  CNS neoplasm, assess treatment response EXAM: MRI HEAD WITHOUT AND WITH CONTRAST TECHNIQUE: Multiplanar, multiecho pulse sequences of the brain and surrounding structures were obtained without and with intravenous contrast. CONTRAST:  6mL GADAVIST  GADOBUTROL  1 MMOL/ML IV SOLN COMPARISON:  05/09/2023 FINDINGS: Brain: Mass along the inferior left temporal occipital surface shows mild interval enlargement, up to 17 x 8 mm on parasagittal images, previously 13 x 6 mm. Even more pronounced growth since 16-Jan-2023. This lesion had a parenchymal epicenter on more remote imaging, regional T2 hyperintensity is new. No new brain parenchymal metastasis. Smooth dural thickening along bilateral greater wing sphenoid bone metastases No acute infarct, hemorrhage, hydrocephalus, or collection. Vascular: Major flow voids and vascular enhancements are preserved Skull and upper cervical spine: Extensive signal abnormality in the upper cervical spine and skull base with large chronic metastasis centered in the left frontal bone. More confluent infiltrative appearance in the clivus and sphenoid skull base. Sinuses/Orbits: Bone expansion and extraconal enhancement encroaches on the left lateral orbit from the left greater sphenoid wing metastasis with mild relative proptosis on the left. IMPRESSION: Mild growth of the  chronic left temporal occipital metastasis with interval mild adjacent brain edema. New new brain metastasis. Progressive skull base osseous metastatic disease with new dural thickening along bilateral greater sphenoid wing deposits. The left-sided deposit encroaches on the lateral left orbit with mild, relative proptosis. Electronically Signed   By: Ronnette Coke M.D.   On: 08/16/2023 05:02

## 2023-08-21 NOTE — Progress Notes (Signed)
 MRI on 08/08/2023. Patient has had a headache for a couple of days now and the oxycodone  isn't helping right now. She has been having a headache, sneezing that has a little of yellow mucus with it, she has been using Flonase  that isn't helping. She has a follow up appointment with Dr. Maida Sciara next week about the spot on her neck.

## 2023-08-21 NOTE — Patient Instructions (Signed)
 [facility] - A DEPT OF [facility]  Discharge Instructions: Thank you for choosing [facility] to provide your oncology and hematology care.  If you have a lab appointment with the Cancer Center, please go directly to the Cancer Center and check in at the registration area.  Wear comfortable clothing and clothing appropriate for easy access to any Portacath or PICC line.   We strive to give you quality time with your provider. You may need to reschedule your appointment if you arrive late (15 or more minutes).  Arriving late affects you and other patients whose appointments are after yours.  Also, if you miss three or more appointments without notifying the office, you may be dismissed from the clinic at the provider's discretion.      For prescription refill requests, have your pharmacy contact our office and allow 72 hours for refills to be completed.    Today you received the following chemotherapy and/or immunotherapy agents Neulasta  and Gemzar .      To help prevent nausea and vomiting after your treatment, we encourage you to take your nausea medication as directed.  BELOW ARE SYMPTOMS THAT SHOULD BE REPORTED IMMEDIATELY: *FEVER GREATER THAN 100.4 F (38 C) OR HIGHER *CHILLS OR SWEATING *NAUSEA AND VOMITING THAT IS NOT CONTROLLED WITH YOUR NAUSEA MEDICATION *UNUSUAL SHORTNESS OF BREATH *UNUSUAL BRUISING OR BLEEDING *URINARY PROBLEMS (pain or burning when urinating, or frequent urination) *BOWEL PROBLEMS (unusual diarrhea, constipation, pain near the anus) TENDERNESS IN MOUTH AND THROAT WITH OR WITHOUT PRESENCE OF ULCERS (sore throat, sores in mouth, or a toothache) UNUSUAL RASH, SWELLING OR PAIN  UNUSUAL VAGINAL DISCHARGE OR ITCHING   Items with * indicate a potential emergency and should be followed up as soon as possible or go to the Emergency Department if any problems should occur.  Please show the CHEMOTHERAPY ALERT CARD or  IMMUNOTHERAPY ALERT CARD at check-in to the Emergency Department and triage nurse.  Should you have questions after your visit or need to cancel or reschedule your appointment, please contact [facility] - A DEPT OF [facility]  [phone] and follow the prompts.  Office hours are 8:00 a.m. to 4:30 p.m. Monday - Friday. Please note that voicemails left after 4:00 p.m. may not be returned until the following business day.  We are closed weekends and major holidays. You have access to a nurse at all times for urgent questions. Please call the main number to the clinic [phone] and follow the prompts.  For any non-urgent questions, you may also contact your provider using MyChart. We now offer e-Visits for anyone 79 and older to request care online for non-urgent symptoms. For details visit [email or web address].   Also download the MyChart app! Go to the app store, search "MyChart", open the app, select [place], and log in with your MyChart username and password.

## 2023-08-22 ENCOUNTER — Telehealth: Payer: Self-pay | Admitting: *Deleted

## 2023-08-22 ENCOUNTER — Other Ambulatory Visit: Payer: Self-pay | Admitting: *Deleted

## 2023-08-22 MED ORDER — DEXAMETHASONE 2 MG PO TABS: 2.0000 mg | ORAL_TABLET | Freq: Two times a day (BID) | ORAL | 0 refills | Status: DC

## 2023-08-22 MED ORDER — LANSOPRAZOLE 15 MG PO CPDR
15.0000 mg | DELAYED_RELEASE_CAPSULE | Freq: Every day | ORAL | 0 refills | Status: AC
Start: 2023-08-22 — End: 2023-09-03

## 2023-08-22 NOTE — Telephone Encounter (Signed)
 Called patient to inform her of prescriptions sne to her pharmacy and directions for the timing of administration. Decadron  and Prevacid.

## 2023-08-26 ENCOUNTER — Telehealth: Payer: Self-pay | Admitting: *Deleted

## 2023-08-26 NOTE — Telephone Encounter (Signed)
 Returned call to patients daughter regarding discussion with Dr. Chrystal about his decision to plan treatment for her mother. Advised daughter to come to the office today to speak with daughter and get education regarding treatment and steroid administration. Daughter verbalized agreement with plan.

## 2023-08-27 DIAGNOSIS — C7931 Secondary malignant neoplasm of brain: Secondary | ICD-10-CM | POA: Diagnosis not present

## 2023-08-27 DIAGNOSIS — C3412 Malignant neoplasm of upper lobe, left bronchus or lung: Secondary | ICD-10-CM | POA: Diagnosis not present

## 2023-08-28 ENCOUNTER — Other Ambulatory Visit

## 2023-09-01 ENCOUNTER — Encounter: Payer: Self-pay | Admitting: Oncology

## 2023-09-01 ENCOUNTER — Ambulatory Visit: Admission: RE | Admit: 2023-09-01 | Source: Ambulatory Visit

## 2023-09-01 NOTE — Telephone Encounter (Signed)
 You can stay in the loop and be the point of contact with patient and family. She has not has a scan after starting gemcitabine . She needs it at some point this month. Not urgent.

## 2023-09-02 ENCOUNTER — Ambulatory Visit
Admission: RE | Admit: 2023-09-02 | Discharge: 2023-09-02 | Disposition: A | Discharge status: Home / Self Care | Source: Ambulatory Visit | Attending: Internal Medicine | Admitting: Internal Medicine

## 2023-09-02 DIAGNOSIS — R918 Other nonspecific abnormal finding of lung field: Secondary | ICD-10-CM | POA: Insufficient documentation

## 2023-09-02 DIAGNOSIS — C7951 Secondary malignant neoplasm of bone: Secondary | ICD-10-CM | POA: Diagnosis not present

## 2023-09-02 DIAGNOSIS — C3490 Malignant neoplasm of unspecified part of unspecified bronchus or lung: Secondary | ICD-10-CM | POA: Diagnosis not present

## 2023-09-02 DIAGNOSIS — R590 Localized enlarged lymph nodes: Secondary | ICD-10-CM | POA: Diagnosis not present

## 2023-09-02 LAB — GLUCOSE, CAPILLARY: Glucose-Capillary: 96 mg/dL (ref 70–99)

## 2023-09-02 MED ORDER — FLUDEOXYGLUCOSE F - 18 (FDG) INJECTION
6.9800 | Freq: Once | INTRAVENOUS | Status: AC | PRN
  Administered 2023-09-02: 6.98 via INTRAVENOUS

## 2023-09-03 ENCOUNTER — Inpatient Hospital Stay

## 2023-09-03 ENCOUNTER — Ambulatory Visit

## 2023-09-03 ENCOUNTER — Encounter: Payer: Self-pay | Admitting: Oncology

## 2023-09-03 ENCOUNTER — Inpatient Hospital Stay: Admitting: Oncology

## 2023-09-03 ENCOUNTER — Other Ambulatory Visit

## 2023-09-03 ENCOUNTER — Ambulatory Visit: Admitting: Internal Medicine

## 2023-09-03 VITALS — BP 118/80 | HR 110 | Temp 97.8°F | Resp 16 | Wt 129.0 lb

## 2023-09-03 DIAGNOSIS — G893 Neoplasm related pain (acute) (chronic): Secondary | ICD-10-CM | POA: Diagnosis not present

## 2023-09-03 DIAGNOSIS — D6959 Other secondary thrombocytopenia: Secondary | ICD-10-CM

## 2023-09-03 DIAGNOSIS — Z7189 Other specified counseling: Secondary | ICD-10-CM

## 2023-09-03 DIAGNOSIS — T451X5A Adverse effect of antineoplastic and immunosuppressive drugs, initial encounter: Secondary | ICD-10-CM | POA: Diagnosis not present

## 2023-09-03 DIAGNOSIS — C3491 Malignant neoplasm of unspecified part of right bronchus or lung: Secondary | ICD-10-CM

## 2023-09-03 DIAGNOSIS — D6481 Anemia due to antineoplastic chemotherapy: Secondary | ICD-10-CM

## 2023-09-03 DIAGNOSIS — Z5111 Encounter for antineoplastic chemotherapy: Secondary | ICD-10-CM | POA: Diagnosis not present

## 2023-09-03 DIAGNOSIS — C3490 Malignant neoplasm of unspecified part of unspecified bronchus or lung: Secondary | ICD-10-CM

## 2023-09-03 LAB — CBC WITH DIFFERENTIAL (CANCER CENTER ONLY)
Abs Immature Granulocytes: 0.2 10*3/uL — ABNORMAL HIGH (ref 0.00–0.07)
Basophils Absolute: 0 10*3/uL (ref 0.0–0.1)
Basophils Relative: 0 %
Eosinophils Absolute: 0.1 10*3/uL (ref 0.0–0.5)
Eosinophils Relative: 1 %
HCT: 25.2 % — ABNORMAL LOW (ref 36.0–46.0)
Hemoglobin: 8.1 g/dL — ABNORMAL LOW (ref 12.0–15.0)
Immature Granulocytes: 3 %
Lymphocytes Relative: 12 %
Lymphs Abs: 0.9 10*3/uL (ref 0.7–4.0)
MCH: 33.6 pg (ref 26.0–34.0)
MCHC: 32.1 g/dL (ref 30.0–36.0)
MCV: 104.6 fL — ABNORMAL HIGH (ref 80.0–100.0)
Monocytes Absolute: 0.5 10*3/uL (ref 0.1–1.0)
Monocytes Relative: 7 %
Neutro Abs: 6 10*3/uL (ref 1.7–7.7)
Neutrophils Relative %: 77 %
Platelet Count: 70 10*3/uL — ABNORMAL LOW (ref 150–400)
RBC: 2.41 MIL/uL — ABNORMAL LOW (ref 3.87–5.11)
RDW: 17.6 % — ABNORMAL HIGH (ref 11.5–15.5)
WBC Count: 7.7 10*3/uL (ref 4.0–10.5)
nRBC: 0.4 % — ABNORMAL HIGH (ref 0.0–0.2)

## 2023-09-03 LAB — CMP (CANCER CENTER ONLY)
ALT: 16 U/L (ref 0–44)
AST: 20 U/L (ref 15–41)
Albumin: 3.6 g/dL (ref 3.5–5.0)
Alkaline Phosphatase: 242 U/L — ABNORMAL HIGH (ref 38–126)
Anion gap: 10 (ref 5–15)
BUN: 25 mg/dL — ABNORMAL HIGH (ref 8–23)
CO2: 18 mmol/L — ABNORMAL LOW (ref 22–32)
Calcium: 8.3 mg/dL — ABNORMAL LOW (ref 8.9–10.3)
Chloride: 107 mmol/L (ref 98–111)
Creatinine: 1.28 mg/dL — ABNORMAL HIGH (ref 0.44–1.00)
GFR, Estimated: 45 mL/min — ABNORMAL LOW (ref >60)
Glucose, Bld: 115 mg/dL — ABNORMAL HIGH (ref 70–99)
Potassium: 3.9 mmol/L (ref 3.5–5.1)
Sodium: 135 mmol/L (ref 135–145)
Total Bilirubin: 0.4 mg/dL (ref 0.0–1.2)
Total Protein: 7.2 g/dL (ref 6.5–8.1)

## 2023-09-03 LAB — SAMPLE TO BLOOD BANK

## 2023-09-03 MED ORDER — HEPARIN SOD (PORK) LOCK FLUSH 100 UNIT/ML IV SOLN
500.0000 [IU] | Freq: Once | INTRAVENOUS | Status: AC
Start: 2023-09-03 — End: 2023-09-03
  Administered 2023-09-03: 500 [IU] via INTRAVENOUS
  Filled 2023-09-03: qty 5

## 2023-09-03 NOTE — Progress Notes (Unsigned)
 Hematology/Oncology Consult note [facility]  [phone] [phone]  Patient Care Team: Valli Gaw, MD as PCP - General (Family Medicine) Drake Gens, RN as Oncology Nurse Navigator Lawerence Pressman, MD as Consulting Physician (Urology)   Name of the patient: Victoria Foley  [id number]  1950-06-25   Date of visit: 09/03/23  Diagnosis- stage IV adenocarcinoma of the lung  Chief complaint/ Reason for visit-discuss CT scan results and further management  Heme/Onc history:  Oncology History  Primary lung adenocarcinoma (HCC)  10/10/2021 Initial Diagnosis   Primary lung adenocarcinoma (HCC) Stage IV  Patient seen by PCP for wheezing for 2 weeks. Imaging with CXR followed by CT chest showed Spiculated 2.1 x 2.2 cm left upper lobe pulmonary nodule with associated pleural tethering. Innumerable subcentimeter pulmonary nodules throughout the lungs.      11/14/2021 PET scan   IMPRESSION: 1. Left suprahilar nodule with maximum SUV 4.5, and a more distal 2.2 cm left upper lobe nodule with maximum SUV of 5.5. 2. Innumerable scattered small pulmonary nodules, most of which are under 5 mm in diameter, throughout both lungs. Some are cavitary. Possibilities include cavitating hematogenous disseminated malignancy versus infectious etiology subset as septic emboli. Most of the nodules are stable, some are minimally enlarged compared to 10/10/2021. 3. Abnormal mild permeative type bony findings in the right hemipelvis associated with substantially accentuated metabolic activity. Possible associated pathologic fracture through the right quadrilateral plate and right acetabulum. The findings involve the right ilium and ischium but also the right lateral sacral ala and a small focus in the left sacrum.    Pathology Results   She had transbronchial biopsies by Dr. Viva Grise on 11/19/21.  Pathology from LUL nodule showed adenocarcinoma,  consistent with lung primary.    11/19/2021 Cancer Staging   Staging form: Lung, AJCC 8th Edition - Clinical stage from 11/19/2021: Stage IVB (cT1c, cM1c) - Signed by Loreatha Rodney, MD on 11/27/2021 Stage prefix: Initial diagnosis   11/27/2021 Imaging   MRI pelvis  IMPRESSION: 1. Abnormal marrow signal throughout the right ilium involving the right acetabulum, right superior pubic ramus and right inferior pubic ramus, right side of the sacrum and a smaller focus of abnormal marrow lesion in the left side of the sacrum. Findings are concerning for metastatic disease. 2. Nondisplaced pathologic fracture of the quadrilateral plate of the right acetabulum. 3. Mild muscle edema in the right gluteus minimus and medius muscles likely reflecting mild muscle strain.  MRI brain  IMPRESSION: No evidence of intracranial metastatic disease.   Indeterminate 1 cm left frontal calvarium lesion.   12/18/2021 - 08/27/2022 Chemotherapy   Patient is on Treatment Plan : LUNG Carboplatin  (5) + Pemetrexed  (500) + Pembrolizumab  (200) D1 q21d Induction x 4 cycles / Maintenance Pemetrexed  (500) + Pembrolizumab  (200) D1 q21d     10/07/2022 - 01/21/2023 Chemotherapy   Patient is on Treatment Plan : LUNG Carboplatin  + Paclitaxel  q21d Dose Reduction     03/27/2023 - 04/24/2023 Chemotherapy   Patient is on Treatment Plan : LUNG Bevacizumab  q21d     06/26/2023 -  Chemotherapy   Patient is on Treatment Plan : LUNG Gemcitabine  (1000) D1,8 q21d     Primary malignant neoplasm of left upper lobe of lung (HCC)  06/04/2022 Initial Diagnosis   Primary malignant neoplasm of left upper lobe of lung (HCC)   06/04/2022 Cancer Staging   Staging form: Lung, AJCC 8th Edition - Pathologic: Stage IVB (pT1c, pNX, cM1c) - Signed by Valentine Gasmen,  Donnald Fuss, MD on 06/04/2022      Interval history-patient is here with her daughter today.  She is sitting in a wheelchair.  Has ongoing fatigue.  No recent falls.  She has lost a couple of  pounds since her last visit.  Pain is currently well-controlled with pain medications  ECOG PS- 2 Pain scale- 0 Opioid associated constipation- no  Review of systems- Review of Systems  Constitutional:  Positive for malaise/fatigue. Negative for chills, fever and weight loss.  HENT:  Negative for congestion, ear discharge and nosebleeds.   Eyes:  Negative for blurred vision.  Respiratory:  Negative for cough, hemoptysis, sputum production, shortness of breath and wheezing.   Cardiovascular:  Negative for chest pain, palpitations, orthopnea and claudication.  Gastrointestinal:  Negative for abdominal pain, blood in stool, constipation, diarrhea, heartburn, melena, nausea and vomiting.  Genitourinary:  Negative for dysuria, flank pain, frequency, hematuria and urgency.  Musculoskeletal:  Negative for back pain, joint pain and myalgias.  Skin:  Negative for rash.  Neurological:  Negative for dizziness, tingling, focal weakness, seizures, weakness and headaches.  Endo/Heme/Allergies:  Does not bruise/bleed easily.  Psychiatric/Behavioral:  Negative for depression and suicidal ideas. The patient does not have insomnia.       Allergies  Allergen Reactions   Diclofenac Hives   Lisinopril     cough   Sulfa  Antibiotics     ? 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   COVID-19    03/2021   Hypertension    Pneumonia    age 73   Pre-diabetes    Prediabetes    Sciatica      Past Surgical History:  Procedure Laterality Date   BRONCHIAL BIOPSY  12/17/2022   Procedure: BRONCHIAL BIOPSIES;  Surgeon: Prudy Brownie, DO;  Location: [facility];  Service: Pulmonary;;   BRONCHIAL NEEDLE ASPIRATION BIOPSY  12/17/2022   Procedure: BRONCHIAL NEEDLE ASPIRATION BIOPSIES;  Surgeon: Prudy Brownie, DO;  Location: [facility];  Service: Pulmonary;;   CESAREAN SECTION     COLONOSCOPY W/ POLYPECTOMY     ECTOPIC PREGNANCY SURGERY     FEMUR SURGERY     left, s/p rod for fracture   IR IMAGING GUIDED PORT INSERTION  12/11/2021   TUBAL LIGATION      Social History   Socioeconomic History   Marital status: Widowed    Spouse name: Not on file   Number of children: Not on file   Years of education: Not on file   Highest education level: Some college, no degree  Occupational History   Not on file  Tobacco Use   Smoking status: Never    Passive exposure: Never   Smokeless tobacco: Never  Vaping Use   Vaping status: Never Used  Substance and Sexual Activity   Alcohol use: Never   Drug use: Never   Sexual activity: Not Currently  Other Topics Concern   Not on file  Social History Narrative   Lives in [place] but husband died June 11, 2020 motorcycle accident bday 10/19/20 married 47 years   No pets   2 daughters and 3 granddaughters      Work - [employer] retired 09/2019   Diet - regular diet  Exercise - none   Social Drivers of Health   Financial Resource Strain: Low Risk  (06/18/2023)   Received from [facility]   Overall Financial Resource Strain (CARDIA)    Difficulty of Paying Living Expenses: Not hard at all  Food Insecurity: No Food Insecurity (06/18/2023)   Received from [facility]   Hunger Vital Sign    Worried About Running Out of Food in the Last Year: Never true    Ran Out of Food in the Last Year: Never true  Transportation Needs: No Transportation Needs (06/18/2023)   Received from [facility] - Transportation    In the past 12 months, has lack of transportation kept you from medical appointments or from getting medications?: No    Lack of Transportation (Non-Medical): No  Physical Activity: Inactive (05/27/2023)   Exercise Vital Sign    Days of Exercise per Week: 0 days    Minutes  of Exercise per Session: 0 min  Stress: No Stress Concern Present (05/27/2023)   [employer] of Occupational Health - Occupational Stress Questionnaire    Feeling of Stress : Not at all  Social Connections: Moderately Isolated (05/27/2023)   Social Connection and Isolation Panel [NHANES]    Frequency of Communication with Friends and Family: More than three times a week    Frequency of Social Gatherings with Friends and Family: Once a week    Attends Religious Services: 1 to 4 times per year    Active Member of [employer] or Organizations: No    Attends Banker Meetings: Never    Marital Status: Widowed  Intimate Partner Violence: Not At Risk (02/05/2023)   Humiliation, Afraid, Rape, and Kick questionnaire    Fear of Current or Ex-Partner: No    Emotionally Abused: No    Physically Abused: No    Sexually Abused: No    Family History  Problem Relation Age of Onset   Diabetes Mother    Hypertension Mother    Heart disease Mother    Cancer Mother        breast and stomach   Breast cancer Mother 66   Diabetes Father    Hypertension Father    Heart disease Father    Kidney disease Father      Current Outpatient Medications:    albuterol  (VENTOLIN  HFA) 108 (90 Base) MCG/ACT inhaler, Inhale 2 puffs into the lungs every 6 (six) hours as needed for wheezing or shortness of breath., Disp: 18 g, Rfl: 0   Azelastine  HCl 137 MCG/SPRAY SOLN, Place 2 sprays into both nostrils 2 (two) times daily., Disp: , Rfl:    Cholecalciferol (VITAMIN D -3 PO), Take by mouth., Disp: , Rfl:    cyclobenzaprine  (FLEXERIL ) 5 MG tablet, Take 1 tablet (5 mg total) by mouth 2 (two) times daily as needed for muscle spasms., Disp: 30 tablet, Rfl: 0   dexamethasone  (DECADRON ) 2 MG tablet, Take 1 tablet (2 mg total) by mouth 2 (two) times daily with a meal. Take as directed by MD, Disp: 11 tablet, Rfl: 0   fluticasone  (FLONASE ) 50 MCG/ACT nasal spray, USE TWO SPRAYS IN EACH NOSTRIL ONCE DAILY prn, Disp: 16  g, Rfl: 12   lactulose  (CHRONULAC ) 10 GM/15ML solution, Take 15 mLs (10 g total) by mouth 3 (three) times daily., Disp: 236 mL, Rfl: 0   lansoprazole  (PREVACID ) 15 MG capsule, Take 1 capsule (15 mg total) by mouth daily at 12 noon for 12 days.,  Disp: 12 capsule, Rfl: 0   loratadine  (CLARITIN ) 10 MG tablet, TAKE ONE TABLET BY MOUTH DAILY AS NEEDED FOR ALLERGIES, Disp: 90 tablet, Rfl: 3   losartan  (COZAAR ) 100 MG tablet, Take 1 tablet (100 mg total) by mouth daily., Disp: 90 tablet, Rfl: 3   magic mouthwash w/lidocaine  SOLN, Take 5 mLs by mouth 4 (four) times daily as needed for mouth pain. Sig: Swish/Swallow 5-10 ml four times a day as needed. Dispense 480 ml. 1RF, Disp: 480 mL, Rfl: 1   mirtazapine  (REMERON ) 15 MG tablet, Take 1 tablet (15 mg total) by mouth at bedtime., Disp: 30 tablet, Rfl: 0   naloxone  (NARCAN ) nasal spray 4 mg/0.1 mL, SPRAY 1 SPRAY INTO ONE NOSTRIL AS DIRECTED FOR OPIOID OVERDOSE (TURN PERSON ON SIDE AFTER DOSE. IF NO RESPONSE IN 2-3 MINUTES OR PERSON RESPONDS BUT RELAPSES, REPEAT USING A NEW SPRAY DEVICE AND SPRAY INTO THE OTHER NOSTRIL. CALL 911 AFTER USE.) * EMERGENCY USE ONLY *, Disp: 1 each, Rfl: 0   ondansetron  (ZOFRAN ) 8 MG tablet, Take 1 tablet (8 mg total) by mouth every 8 (eight) hours as needed for nausea or vomiting., Disp: 30 tablet, Rfl: 3   oxyCODONE  (OXY IR/ROXICODONE ) 5 MG immediate release tablet, Take 1-2 tablets (5-10 mg total) by mouth every 4 (four) hours as needed for severe pain (pain score 7-10)., Disp: 60 tablet, Rfl: 0   oxyCODONE  ER (XTAMPZA  ER) 9 MG C12A, Take 1 capsule by mouth every 12 (twelve) hours., Disp: 60 capsule, Rfl: 0   senna (SENOKOT) 8.6 MG TABS tablet, Take 1 tablet by mouth daily., Disp: , Rfl:  No current facility-administered medications for this visit.  Facility-Administered Medications Ordered in Other Visits:    heparin  lock flush 100 UNIT/ML injection, , , ,    heparin  lock flush 100 unit/mL, 500 Units, Intravenous, Once, Agrawal,  Kavita, MD   sodium chloride  flush (NS) 0.9 % injection 10 mL, 10 mL, Intravenous, Once, Agrawal, Kavita, MD  Physical exam: There were no vitals filed for this visit. Physical Exam Constitutional:      Comments: Sitting in a wheelchair.  Appears in no acute distress  Cardiovascular:     Rate and Rhythm: Normal rate and regular rhythm.     Heart sounds: Normal heart sounds.  Pulmonary:     Effort: Pulmonary effort is normal.     Breath sounds: Normal breath sounds.  Abdominal:     General: Bowel sounds are normal.     Palpations: Abdomen is soft.  Skin:    General: Skin is warm and dry.  Neurological:     Mental Status: She is alert and oriented to person, place, and time.      I have personally reviewed labs listed below:    Latest Ref Rng & Units 08/21/2023    9:49 AM  CMP  Glucose 70 - 99 mg/dL 811   BUN 8 - 23 mg/dL 15   Creatinine 9.14 - 1.00 mg/dL 7.82   Sodium 956 - 213 mmol/L 134   Potassium 3.5 - 5.1 mmol/L 3.9   Chloride 98 - 111 mmol/L 106   CO2 22 - 32 mmol/L 20   Calcium 8.9 - 10.3 mg/dL 8.2   Total Protein 6.5 - 8.1 g/dL 6.9   Total Bilirubin 0.0 - 1.2 mg/dL 0.5   Alkaline Phos 38 - 126 U/L 225   AST 15 - 41 U/L 18   ALT 0 - 44 U/L 12       Latest  Ref Rng & Units 08/21/2023    9:49 AM  CBC  WBC 4.0 - 10.5 K/uL 11.4   Hemoglobin 12.0 - 15.0 g/dL 8.8   Hematocrit 78.2 - 46.0 % 26.9   Platelets 150 - 400 K/uL 97    I have personally reviewed Radiology images listed below: No images are attached to the encounter.  NM PET Image Restag (PS) Skull Base To Thigh Result Date: 09/02/2023 CLINICAL DATA:  Subsequent treatment strategy for non-small-cell lung cancer. EXAM: NUCLEAR MEDICINE PET SKULL BASE TO THIGH TECHNIQUE: 6.98 mCi F-18 FDG was injected intravenously. Full-ring PET imaging was performed from the skull base to thigh after the radiotracer. CT data was obtained and used for attenuation correction and anatomic localization. Fasting blood glucose: 96  mg/dl COMPARISON:  PET-CT scan 05/16/2023. Abdomen pelvis CT 06/02/2023. Shoulder and hip MRI examinations January 2025 as well FINDINGS: Mediastinal blood pool activity: SUV max 2.7 Liver activity: SUV max 3.7 NECK: Previously there is extensive brown fat uptake. Today there are some areas uptake along small nodes in the neck which are new. Example includes left supraclavicular with maximum SUV value of 3.8. On image 26 of the CT scan this node measures 6 x 10 mm. New from previous. Small node more superiorly along the jugular chain zone 2 has maximum SUV of 3.0. The small node deep to the sternocleidomastoid muscle measures 10 x 5 mm. Also new. Node just caudal to this in zone 3 has maximum SUV value of 6.1 and this lymph node measures 8 x 8 mm. No abnormal uptake along right-sided nodes. Near symmetric uptake of the visualized intracranial compartment. Incidental CT findings: Paranasal sinuses and mastoid air cells are clear. Streak artifact related to the patient's dental hardware. The parotid glands, submandibular glands and thyroid  gland are preserved. CHEST: Left upper lobe spiculated mass again identified. Previously this had maximum SUV value 5.9. Today 7.5. On the prior the spiculated nodule was measured at 2.0 x 1.4 cm. Today when measured in a similar fashion 2.2 by 1.4 cm. The adjacent small areas of nodularity extending anteromedial is slightly larger. This area today measure 18 by 8 mm on image 40 and previously 12 x 9 mm. Uptake in this location is also particularly increased. This could be disease progression. In addition there is an area of uptake just lateral to this area in the left upper lobe with maximum SUV 3.0. This corresponds to a small area of ground-glass on image 41 measuring 8 mm. The area was seen previously but the uptake has increased. Previously only 1.2. Similar focus seen posteriorly in left upper lobe on image 39 with maximum SUV of 2.7. There is an increasing nodule in this  location measuring 7 mm. There are several small areas of uptake identified elsewhere in both lungs. There are increasing areas tiny nodules scattered in both lungs particularly in the upper lobes. Vast majority do not show any abnormal uptake but are quite small. These have a differential including lung metastases. Focus of increased uptake as well along left side of the mediastinum/hilum with maximum SUV of 4.8. This is seen on image 44 and could be a small lymph node along the AP window which is new measuring 5 mm. No other definite abnormal lymph node uptake identified this time. There is some mild uptake along the lower esophagus which is nonspecific. Incidental CT findings: Right IJ chest port. Heart is nonenlarged. No significant pericardial effusion. The thoracic aorta is normal course and caliber. Mild calcified  plaque. No pleural effusion or pneumothorax. Breathing motion. ABDOMEN/PELVIS: There is physiologic distribution radiotracer along the parenchymal organs, bowel and renal collecting systems. Incidental CT findings: Gallstones. Grossly the liver, spleen, adrenal glands are unremarkable. No abnormal calcifications seen within either kidney nor along the expected course of either ureter. Underdistended urinary bladder. Large bowel is normal course and caliber. Scattered colonic stool. Normal retrocecal appendix. Stomach and small bowel are nondilated. SKELETON: Once again there is extensive areas of abnormal uptake along the skeleton consistent with known osseous metastatic disease. Again there is extensive involvement of the spine, pelvis, sacrum, femurs, humeri, sternum. There are several areas that are increasing in particular throughout the thoracic spine, proximal humeri, right femoral shaft. There is also increasing areas along lumbar spine with now on ball of mint more diffusely for example at the L2 level. Left femur intramedullary rod identified. Incidental CT findings: Curvature of spine.  Diffuse degenerative changes. Stable areas of compression such as at L4. IMPRESSION: Overall progression of disease. Extensive osseous metastatic disease which has further increased today diffusely. The primary lesion left upper lobe centrally within the spiculated area is similar to previous with slightly more uptake. However the surrounding areas of nodularity to this main lesion have increased in uptake and size. In addition there are several scattered bilateral small lung nodules which subjectively of increase in size and now some show increasing uptake. New small mildly hypermetabolic left-sided mediastinal AP window lymph node as well as some small nodes in the left neck. Electronically Signed   By: Adrianna Horde M.D.   On: 09/02/2023 13:08   MR BRAIN W WO CONTRAST Result Date: 08/16/2023 CLINICAL DATA:  CNS neoplasm, assess treatment response EXAM: MRI HEAD WITHOUT AND WITH CONTRAST TECHNIQUE: Multiplanar, multiecho pulse sequences of the brain and surrounding structures were obtained without and with intravenous contrast. CONTRAST:  6mL GADAVIST  GADOBUTROL  1 MMOL/ML IV SOLN COMPARISON:  05/09/2023 FINDINGS: Brain: Mass along the inferior left temporal occipital surface shows mild interval enlargement, up to 17 x 8 mm on parasagittal images, previously 13 x 6 mm. Even more pronounced growth since January 30, 2023. This lesion had a parenchymal epicenter on more remote imaging, regional T2 hyperintensity is new. No new brain parenchymal metastasis. Smooth dural thickening along bilateral greater wing sphenoid bone metastases No acute infarct, hemorrhage, hydrocephalus, or collection. Vascular: Major flow voids and vascular enhancements are preserved Skull and upper cervical spine: Extensive signal abnormality in the upper cervical spine and skull base with large chronic metastasis centered in the left frontal bone. More confluent infiltrative appearance in the clivus and sphenoid skull base. Sinuses/Orbits: Bone  expansion and extraconal enhancement encroaches on the left lateral orbit from the left greater sphenoid wing metastasis with mild relative proptosis on the left. IMPRESSION: Mild growth of the chronic left temporal occipital metastasis with interval mild adjacent brain edema. New new brain metastasis. Progressive skull base osseous metastatic disease with new dural thickening along bilateral greater sphenoid wing deposits. The left-sided deposit encroaches on the lateral left orbit with mild, relative proptosis. Electronically Signed   By: Ronnette Coke M.D.   On: 08/16/2023 05:02     Assessment and plan- Patient is a 73 y.o. female with history of metastatic adenocarcinoma of the lung here to discuss further management  I have reviewed PET CT scan images independently and discussed findings with the patient and her daughter.  PET scan unfortunately shows evidence of progressive disease as evidenced by hypermetabolic uptake in the small nodes of the neck.  Bilateral lung nodules are more conspicuous and hypermetabolic as well.  Further worsening of osseous metastatic disease is noted.  Patient was initially treated with carbo Alimta Keytruda  chemotherapy.  She was on maintenance Alimta and Keytruda  for a short while following which Alimta was stopped due to progressive thrombocytopenia and AKI and she was continued on Keytruda  alone.  Upon disease progression patient was switched to CarboTaxol bevacizumab .  She had disease progression on that as well and most recently has received 2 cycles of gemcitabine  dose reduced due to ongoing cytopenias from myelosuppression from prior chemotherapy.  I discussed the following options moving forward:  Retrying low-dose Alimta given that her kidney functions have improved although her anemia and thrombocytopenia remain a challenge to continue ongoing chemotherapy Patient has received only 4 doses of gemcitabine  and was noted to have disease progression.  We could  try 2 more cycles to see if it could stabilize her disease. Offering low-dose docetaxel in the presence of cytopenias and ongoing issues with balance would be a challenge and I would not recommend that Considering a second opinion with Dr. Fulton Job at [facility] who she has seen in the past to see if there are any other options available Considering best supportive care/hospice given that she has progressed on multiple lines of chemotherapy  Patient would like to see Dr. Fulton Job from [facility] and I will await his opinion at this time.  Outside of clinical trial I would favor low-dose Alimta if tolerated.  I am also awaiting her MRI brain which will be done in the next 2 days followed by discussion at tumor board   Neoplasm related pain: I have renewed her Xtampza  today.  Goals of care conversation: Patient does not have any advance directive or MOST form updated.  I have encouraged her to think about these things.  I will have her follow-up with palliative care after her visit with [facility]   Visit Diagnosis 1. Cancer related pain   2. Goals of care, counseling/discussion   3. Primary adenocarcinoma of right lung (HCC)   4. Anemia due to antineoplastic chemotherapy   5. Chemotherapy-induced thrombocytopenia      Dr. Seretha Dance, MD, MPH [facility] [phone] 09/03/2023 9:10 AM

## 2023-09-03 NOTE — Progress Notes (Unsigned)
 Patient had a PET scan done yesterday, 09/02/2023. She needs a refill on her Oxycodone  ER the one she takes every 12 hours. Her appetite is still not good, she is still trying to drink one of the Ensure drinks a day. Her pain is ok right now.

## 2023-09-04 ENCOUNTER — Other Ambulatory Visit

## 2023-09-04 ENCOUNTER — Encounter: Payer: Self-pay | Admitting: Internal Medicine

## 2023-09-04 ENCOUNTER — Ambulatory Visit: Admitting: Radiation Oncology

## 2023-09-04 ENCOUNTER — Ambulatory Visit
Admission: RE | Admit: 2023-09-04 | Discharge: 2023-09-04 | Disposition: A | Discharge status: Home / Self Care | Source: Ambulatory Visit | Attending: Radiation Oncology | Admitting: Radiation Oncology

## 2023-09-04 DIAGNOSIS — C7951 Secondary malignant neoplasm of bone: Secondary | ICD-10-CM | POA: Diagnosis not present

## 2023-09-04 DIAGNOSIS — C3490 Malignant neoplasm of unspecified part of unspecified bronchus or lung: Secondary | ICD-10-CM | POA: Diagnosis not present

## 2023-09-04 DIAGNOSIS — C7931 Secondary malignant neoplasm of brain: Secondary | ICD-10-CM | POA: Insufficient documentation

## 2023-09-04 DIAGNOSIS — G936 Cerebral edema: Secondary | ICD-10-CM | POA: Diagnosis not present

## 2023-09-04 DIAGNOSIS — R930 Abnormal findings on diagnostic imaging of skull and head, not elsewhere classified: Secondary | ICD-10-CM | POA: Diagnosis not present

## 2023-09-04 MED ORDER — GADOBUTROL 1 MMOL/ML IV SOLN
5.0000 mL | Freq: Once | INTRAVENOUS | Status: AC | PRN
  Administered 2023-09-04: 5 mL via INTRAVENOUS

## 2023-09-04 MED ORDER — XTAMPZA ER 9 MG PO C12A: 1.0000 | EXTENDED_RELEASE_CAPSULE | Freq: Two times a day (BID) | ORAL | 0 refills | Status: DC

## 2023-09-08 ENCOUNTER — Encounter: Payer: Self-pay | Admitting: *Deleted

## 2023-09-08 ENCOUNTER — Other Ambulatory Visit: Payer: Self-pay | Admitting: *Deleted

## 2023-09-08 ENCOUNTER — Ambulatory Visit
Admission: RE | Admit: 2023-09-08 | Discharge: 2023-09-08 | Disposition: A | Discharge status: Home / Self Care | Source: Ambulatory Visit | Attending: Radiation Oncology | Admitting: Radiation Oncology

## 2023-09-08 ENCOUNTER — Encounter: Payer: Self-pay | Admitting: Radiation Oncology

## 2023-09-08 DIAGNOSIS — C3412 Malignant neoplasm of upper lobe, left bronchus or lung: Secondary | ICD-10-CM | POA: Diagnosis not present

## 2023-09-08 DIAGNOSIS — C7931 Secondary malignant neoplasm of brain: Secondary | ICD-10-CM

## 2023-09-08 DIAGNOSIS — C7951 Secondary malignant neoplasm of bone: Secondary | ICD-10-CM | POA: Diagnosis not present

## 2023-09-08 DIAGNOSIS — Z510 Encounter for antineoplastic radiation therapy: Secondary | ICD-10-CM | POA: Insufficient documentation

## 2023-09-08 MED ORDER — DEXAMETHASONE 2 MG PO TABS: 2.0000 mg | ORAL_TABLET | Freq: Two times a day (BID) | ORAL | 0 refills | Status: AC

## 2023-09-08 NOTE — Progress Notes (Signed)
 Radiation Oncology Follow up Note  Name: Victoria Foley   Date:   09/08/2023 MRN:  [id number] DOB: June 17, 1950    This 73 y.o. female presents to the clinic today for reevaluation of solitary brain metastasis and patient previously treated for stage IV adenocarcinoma of the lung.  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  She previously received palliative radiation therapy for a calvarium lesion.  She is also received radiation therapy to her spine in the past.  Recent MRI scan had shown mild growth of the left temporal occipital metastasis with interval mild adjacent brain edema.  She was having some headaches.  I ordered a PET CT scan.  Which showed overall progression of disease with extensive osseous metastatic disease which had further increased.  The left upper lobe lesion had some spiculated areas similar to previous exam although the nodularity of the main lesion had increased in uptake in size in addition there were several scattered bilateral small lung nodules which had subjectively increased in size.  We presented her case at our tumor conference as well as I reviewed her case with neuroradiology with recommendation for treating her solitary brain metastasis.  She states she is having some bone pain nothing significant and still has trouble ambulating with some pain in her bilateral hips.  She specifically Nuys change in visual fields any focal neurologic deficits.  COMPLICATIONS OF TREATMENT: none  FOLLOW UP COMPLIANCE: keeps appointments   PHYSICAL EXAM:  There were no vitals taken for this visit. Well-developed well-nourished patient in NAD. HEENT reveals PERLA, EOMI, discs not visualized.  Oral cavity is clear. No oral mucosal lesions are identified. Neck is clear without  evidence of cervical or supraclavicular adenopathy. Lungs are clear to A&P. Cardiac examination is essentially unremarkable with regular rate and rhythm without murmur rub or thrill. Abdomen is benign with no organomegaly or masses noted. Motor sensory and DTR levels are equal and symmetric in the upper and lower extremities. Cranial nerves II through XII are grossly intact. Proprioception is intact. No peripheral adenopathy or edema is identified. No motor or sensory levels are noted. Crude visual fields are within normal range.  RADIOLOGY RESULTS: PET scan reviewed MRI of the brain reviewed all compatible with above-stated findings  PLAN: At this time we will go ahead with SRS to her left occipital brain metastasis.  Will plan on delivering 20 Martina Sledge in single fraction risks and benefits of treatment including possible fatigue possible hair loss all were discussed with the patient and her daughter.  They have consented to treatment.  Simulation will be performed today with treatment later this week.  I would like to take this opportunity to thank you for allowing me to participate in the care of your patient.Glenis Langdon, MD

## 2023-09-09 DIAGNOSIS — C7931 Secondary malignant neoplasm of brain: Secondary | ICD-10-CM | POA: Diagnosis not present

## 2023-09-09 DIAGNOSIS — Z510 Encounter for antineoplastic radiation therapy: Secondary | ICD-10-CM | POA: Diagnosis not present

## 2023-09-09 DIAGNOSIS — C3412 Malignant neoplasm of upper lobe, left bronchus or lung: Secondary | ICD-10-CM | POA: Diagnosis not present

## 2023-09-11 ENCOUNTER — Ambulatory Visit
Admission: RE | Admit: 2023-09-11 | Discharge: 2023-09-11 | Disposition: A | Discharge status: Home / Self Care | Source: Ambulatory Visit | Attending: Radiation Oncology | Admitting: Radiation Oncology

## 2023-09-11 ENCOUNTER — Other Ambulatory Visit: Payer: Self-pay

## 2023-09-11 DIAGNOSIS — C3412 Malignant neoplasm of upper lobe, left bronchus or lung: Secondary | ICD-10-CM | POA: Diagnosis not present

## 2023-09-11 DIAGNOSIS — Z510 Encounter for antineoplastic radiation therapy: Secondary | ICD-10-CM | POA: Diagnosis not present

## 2023-09-11 DIAGNOSIS — C7931 Secondary malignant neoplasm of brain: Secondary | ICD-10-CM | POA: Diagnosis not present

## 2023-09-11 LAB — RAD ONC ARIA SESSION SUMMARY
Course Elapsed Days: 0
Plan Fractions Treated to Date: 1
Plan Prescribed Dose Per Fraction: 20 Gy
Plan Total Fractions Prescribed: 1
Plan Total Prescribed Dose: 20 Gy
Reference Point Dosage Given to Date: 20 Gy
Reference Point Session Dosage Given: 20 Gy
Session Number: 1

## 2023-09-11 NOTE — Telephone Encounter (Signed)
 Best to see me on 6/13. Cancel 5/28

## 2023-09-12 NOTE — Radiation Completion Notes (Signed)
 Patient Name: Victoria Foley, Victoria Foley MRN: [id number] Date of Birth: [birth year] Referring Physician: Valli Gaw, M.D. Date of Service: 2023-09-12 Radiation Oncologist: Glenis Langdon, M.D. 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  She previously received palliative radiation therapy for a calvarium lesion.  She is also received radiation therapy to her spine in the past.  Recent MRI scan had shown mild growth of the left temporal occipital metastasis with interval mild adjacent brain edema.  She was having some headaches.  I ordered a PET CT scan.  Which showed overall progression of disease with extensive osseous metastatic disease which had further increased.  The left upper lobe lesion had some spiculated areas similar to previous exam although the nodularity of the main lesion had increased in uptake in size in addition there were several scattered bilateral small lung nodules which had subjectively increased in size.  We presented her case at our tumor conference as well as I reviewed her case with neuroradiology with recommendation for treating her solitary brain metastasis.  She states she is having some bone pain nothing significant and still has trouble ambulating with some pain in her bilateral hips.  She specifically Nuys change in visual fields any focal neurologic deficits.      ==========DELIVERED PLANS==========  First Treatment Date: 2023-09-11 Last Treatment Date: 2023-09-11   Plan Name: Brain_SRS Site: Brain Technique:  SBRT/SRT-IMRT Mode: Photon Dose Per Fraction: 20 Gy Prescribed Dose (Delivered / Prescribed): 20 Gy / 20 Gy Prescribed Fxs (Delivered / Prescribed): 1 / 1     ==========ON TREATMENT VISIT DATES========== 2023-09-11, 2023-09-11     ==========UPCOMING VISITS==========       ==========APPENDIX - ON TREATMENT VISIT NOTES==========   See weekly On Treatment Notes in Epic for details in the Media tab (listed as Progress notes on the On Treatment Visit Dates listed above).

## 2023-09-17 ENCOUNTER — Inpatient Hospital Stay: Admitting: Oncology

## 2023-09-17 ENCOUNTER — Inpatient Hospital Stay

## 2023-09-18 ENCOUNTER — Other Ambulatory Visit

## 2023-09-18 ENCOUNTER — Ambulatory Visit

## 2023-09-18 ENCOUNTER — Ambulatory Visit: Admitting: Internal Medicine

## 2023-10-01 ENCOUNTER — Encounter: Payer: Self-pay | Admitting: Oncology

## 2023-10-01 ENCOUNTER — Inpatient Hospital Stay

## 2023-10-01 ENCOUNTER — Telehealth: Payer: Self-pay | Admitting: *Deleted

## 2023-10-01 ENCOUNTER — Other Ambulatory Visit: Payer: Self-pay

## 2023-10-01 ENCOUNTER — Inpatient Hospital Stay: Attending: Internal Medicine | Admitting: Hospice and Palliative Medicine

## 2023-10-01 ENCOUNTER — Ambulatory Visit

## 2023-10-01 ENCOUNTER — Other Ambulatory Visit: Payer: Self-pay | Admitting: *Deleted

## 2023-10-01 ENCOUNTER — Encounter: Payer: Self-pay | Admitting: Hospice and Palliative Medicine

## 2023-10-01 VITALS — BP 142/85 | HR 88 | Temp 97.8°F | Resp 18 | Ht 59.0 in | Wt 129.0 lb

## 2023-10-01 DIAGNOSIS — K8020 Calculus of gallbladder without cholecystitis without obstruction: Secondary | ICD-10-CM | POA: Diagnosis not present

## 2023-10-01 DIAGNOSIS — Z79899 Other long term (current) drug therapy: Secondary | ICD-10-CM | POA: Insufficient documentation

## 2023-10-01 DIAGNOSIS — C3412 Malignant neoplasm of upper lobe, left bronchus or lung: Secondary | ICD-10-CM | POA: Insufficient documentation

## 2023-10-01 DIAGNOSIS — D61810 Antineoplastic chemotherapy induced pancytopenia: Secondary | ICD-10-CM | POA: Diagnosis not present

## 2023-10-01 DIAGNOSIS — C3492 Malignant neoplasm of unspecified part of left bronchus or lung: Secondary | ICD-10-CM | POA: Diagnosis not present

## 2023-10-01 DIAGNOSIS — H532 Diplopia: Secondary | ICD-10-CM | POA: Insufficient documentation

## 2023-10-01 DIAGNOSIS — N189 Chronic kidney disease, unspecified: Secondary | ICD-10-CM | POA: Insufficient documentation

## 2023-10-01 DIAGNOSIS — Z923 Personal history of irradiation: Secondary | ICD-10-CM | POA: Insufficient documentation

## 2023-10-01 DIAGNOSIS — C3490 Malignant neoplasm of unspecified part of unspecified bronchus or lung: Secondary | ICD-10-CM | POA: Diagnosis not present

## 2023-10-01 DIAGNOSIS — E860 Dehydration: Secondary | ICD-10-CM | POA: Diagnosis not present

## 2023-10-01 DIAGNOSIS — Z9221 Personal history of antineoplastic chemotherapy: Secondary | ICD-10-CM | POA: Insufficient documentation

## 2023-10-01 DIAGNOSIS — R630 Anorexia: Secondary | ICD-10-CM | POA: Diagnosis not present

## 2023-10-01 DIAGNOSIS — T451X5A Adverse effect of antineoplastic and immunosuppressive drugs, initial encounter: Secondary | ICD-10-CM | POA: Diagnosis not present

## 2023-10-01 DIAGNOSIS — C7800 Secondary malignant neoplasm of unspecified lung: Secondary | ICD-10-CM | POA: Insufficient documentation

## 2023-10-01 DIAGNOSIS — C7951 Secondary malignant neoplasm of bone: Secondary | ICD-10-CM | POA: Diagnosis not present

## 2023-10-01 DIAGNOSIS — I129 Hypertensive chronic kidney disease with stage 1 through stage 4 chronic kidney disease, or unspecified chronic kidney disease: Secondary | ICD-10-CM | POA: Diagnosis not present

## 2023-10-01 DIAGNOSIS — R519 Headache, unspecified: Secondary | ICD-10-CM | POA: Diagnosis not present

## 2023-10-01 DIAGNOSIS — C7931 Secondary malignant neoplasm of brain: Secondary | ICD-10-CM | POA: Insufficient documentation

## 2023-10-01 DIAGNOSIS — Z8616 Personal history of COVID-19: Secondary | ICD-10-CM | POA: Insufficient documentation

## 2023-10-01 DIAGNOSIS — R112 Nausea with vomiting, unspecified: Secondary | ICD-10-CM | POA: Insufficient documentation

## 2023-10-01 DIAGNOSIS — Z7952 Long term (current) use of systemic steroids: Secondary | ICD-10-CM | POA: Insufficient documentation

## 2023-10-01 DIAGNOSIS — Z803 Family history of malignant neoplasm of breast: Secondary | ICD-10-CM | POA: Diagnosis not present

## 2023-10-01 DIAGNOSIS — R600 Localized edema: Secondary | ICD-10-CM | POA: Insufficient documentation

## 2023-10-01 DIAGNOSIS — Z95828 Presence of other vascular implants and grafts: Secondary | ICD-10-CM

## 2023-10-01 DIAGNOSIS — G893 Neoplasm related pain (acute) (chronic): Secondary | ICD-10-CM | POA: Diagnosis not present

## 2023-10-01 LAB — CBC WITH DIFFERENTIAL (CANCER CENTER ONLY)
Abs Immature Granulocytes: 0.01 K/uL (ref 0.00–0.07)
Basophils Absolute: 0 K/uL (ref 0.0–0.1)
Basophils Relative: 0 %
Eosinophils Absolute: 0 K/uL (ref 0.0–0.5)
Eosinophils Relative: 1 %
HCT: 28.1 % — ABNORMAL LOW (ref 36.0–46.0)
Hemoglobin: 9 g/dL — ABNORMAL LOW (ref 12.0–15.0)
Immature Granulocytes: 0 %
Lymphocytes Relative: 15 %
Lymphs Abs: 0.5 K/uL — ABNORMAL LOW (ref 0.7–4.0)
MCH: 33.5 pg (ref 26.0–34.0)
MCHC: 32 g/dL (ref 30.0–36.0)
MCV: 104.5 fL — ABNORMAL HIGH (ref 80.0–100.0)
Monocytes Absolute: 0.3 K/uL (ref 0.1–1.0)
Monocytes Relative: 8 %
Neutro Abs: 2.6 K/uL (ref 1.7–7.7)
Neutrophils Relative %: 76 %
Platelet Count: 141 K/uL — ABNORMAL LOW (ref 150–400)
RBC: 2.69 MIL/uL — ABNORMAL LOW (ref 3.87–5.11)
RDW: 14.2 % (ref 11.5–15.5)
WBC Count: 3.4 K/uL — ABNORMAL LOW (ref 4.0–10.5)
nRBC: 0 % (ref 0.0–0.2)

## 2023-10-01 LAB — CMP (CANCER CENTER ONLY)
ALT: 11 U/L (ref 0–44)
AST: 21 U/L (ref 15–41)
Albumin: 3.5 g/dL (ref 3.5–5.0)
Alkaline Phosphatase: 255 U/L — ABNORMAL HIGH (ref 38–126)
Anion gap: 8 (ref 5–15)
BUN: 14 mg/dL (ref 8–23)
CO2: 19 mmol/L — ABNORMAL LOW (ref 22–32)
Calcium: 7.7 mg/dL — ABNORMAL LOW (ref 8.9–10.3)
Chloride: 105 mmol/L (ref 98–111)
Creatinine: 1.08 mg/dL — ABNORMAL HIGH (ref 0.44–1.00)
GFR, Estimated: 55 mL/min — ABNORMAL LOW (ref >60)
Glucose, Bld: 165 mg/dL — ABNORMAL HIGH (ref 70–99)
Potassium: 4 mmol/L (ref 3.5–5.1)
Sodium: 132 mmol/L — ABNORMAL LOW (ref 135–145)
Total Bilirubin: 0.7 mg/dL (ref 0.0–1.2)
Total Protein: 6.6 g/dL (ref 6.5–8.1)

## 2023-10-01 MED ORDER — DEXAMETHASONE 2 MG PO TABS: 2.0000 mg | ORAL_TABLET | Freq: Every day | ORAL | 0 refills | Status: DC

## 2023-10-01 MED ORDER — SODIUM CHLORIDE 0.9 % IV SOLN
1.0000 g | Freq: Once | INTRAVENOUS | Status: AC
  Administered 2023-10-01: 1 g via INTRAVENOUS
  Filled 2023-10-01: qty 10

## 2023-10-01 MED ORDER — SODIUM CHLORIDE 0.9% FLUSH
10.0000 mL | Freq: Once | INTRAVENOUS | Status: DC
  Filled 2023-10-01: qty 10

## 2023-10-01 MED ORDER — SODIUM CHLORIDE 0.9% FLUSH
10.0000 mL | Freq: Once | INTRAVENOUS | Status: AC
  Administered 2023-10-01: 10 mL via INTRAVENOUS
  Filled 2023-10-01: qty 10

## 2023-10-01 MED ORDER — HEPARIN SOD (PORK) LOCK FLUSH 100 UNIT/ML IV SOLN
500.0000 [IU] | Freq: Once | INTRAVENOUS | Status: AC
  Administered 2023-10-01: 500 [IU]
  Filled 2023-10-01: qty 5

## 2023-10-01 MED ORDER — SODIUM CHLORIDE 0.9 % IV SOLN
INTRAVENOUS | Status: DC
  Filled 2023-10-01 (×3): qty 250

## 2023-10-01 NOTE — Progress Notes (Signed)
 Symptom Management Clinic [facility] at [facility] [phone] [phone]  Patient Care Team: Valli Gaw, MD as PCP - General (Family Medicine) Drake Gens, RN as Oncology Nurse Navigator Lawerence Pressman, MD as Consulting Physician (Urology) Avonne Boettcher, MD as Consulting Physician (Oncology)   NAME OF PATIENT: Victoria Foley  [id number]  12-05-1950   DATE OF VISIT: 10/01/23  REASON FOR CONSULT: COLLYN SELK is a 73 y.o. 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  Patient is status post RT to the right hip due to nondisplaced pathologic fracture.  She is status post palliative RT to brain.  Patient has had previous disease progression on multiple lines of chemotherapy.  INTERVAL HISTORY: Patient last saw Dr. Randy Buttery on 09/03/2023.  Unfortunately, PET scan on 09/02/2023 revealed overall progression of disease with increased osseous metastatic disease, increased size and uptake of left upper lobe pulmonary mass, and new nodal metastatic disease.  Patient was referred to [facility] for second opinion.  Patient presents [facility] today for evaluation of nausea vomiting, decreased appetite, and headaches.  Patient reports several days of feeling fatigued.  Has had several episodes of nausea and vomiting.  Oral intake has been poor.  Patient has had intermittent headaches but does not have dizziness, visual changes, or other symptoms.  Reports that she takes Tylenol  with some improvement.  Reports stable pain.  Denies fever or chills.  No respiratory symptoms.  Denies urinary complaints.  No other symptomatic complaints or concerns today.  PAST MEDICAL HISTORY: Past Medical History:  Diagnosis Date   Abnormal urine 06/19/2022   Anemia    Anxiety    Arthritis    Cancer (HCC)    Lung, 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   COVID-19    03/2021   Hypertension    Pneumonia    age 74   Pre-diabetes    Prediabetes    Sciatica     PAST SURGICAL HISTORY:  Past Surgical History:  Procedure Laterality Date   BRONCHIAL BIOPSY  12/17/2022   Procedure: BRONCHIAL BIOPSIES;  Surgeon: Prudy Brownie, DO;  Location: [facility] ENDOSCOPY;  Service: Pulmonary;;   BRONCHIAL NEEDLE ASPIRATION BIOPSY  12/17/2022   Procedure: BRONCHIAL NEEDLE ASPIRATION BIOPSIES;  Surgeon: Prudy Brownie, DO;  Location: [facility] ENDOSCOPY;  Service: Pulmonary;;   CESAREAN SECTION     COLONOSCOPY W/ POLYPECTOMY     ECTOPIC PREGNANCY SURGERY     FEMUR SURGERY     left, s/p rod for fracture   IR IMAGING GUIDED PORT INSERTION  12/11/2021   TUBAL LIGATION      HEMATOLOGY/ONCOLOGY HISTORY:  Oncology History  Primary lung adenocarcinoma (HCC)  10/10/2021 Initial Diagnosis   Primary lung adenocarcinoma (HCC) Stage IV  Patient seen by PCP for wheezing for 2 weeks. Imaging with CXR followed by CT chest showed Spiculated 2.1 x 2.2 cm left upper lobe pulmonary nodule with associated pleural tethering. Innumerable subcentimeter pulmonary nodules throughout the lungs.      11/14/2021 PET scan   IMPRESSION: 1. Left suprahilar nodule with maximum SUV 4.5, and a more distal 2.2 cm left upper lobe nodule with maximum SUV of 5.5. 2. Innumerable scattered small pulmonary nodules, most of which are under 5 mm in diameter, throughout both lungs. Some are cavitary. Possibilities include cavitating hematogenous disseminated malignancy versus infectious etiology  subset as septic emboli. Most of the nodules are stable, some are minimally enlarged compared to 10/10/2021. 3. Abnormal mild permeative type bony findings in the right hemipelvis associated with substantially accentuated metabolic activity. Possible associated pathologic fracture through the right quadrilateral plate and right acetabulum. The findings  involve the right ilium and ischium but also the right lateral sacral ala and a small focus in the left sacrum.    Pathology Results   She had transbronchial biopsies by Dr. Viva Grise on 11/19/21.  Pathology from LUL nodule showed adenocarcinoma, consistent with lung primary.    11/19/2021 Cancer Staging   Staging form: Lung, AJCC 8th Edition - Clinical stage from 11/19/2021: Stage IVB (cT1c, cM1c) - Signed by Loreatha Rodney, MD on 11/27/2021 Stage prefix: Initial diagnosis   11/27/2021 Imaging   MRI pelvis  IMPRESSION: 1. Abnormal marrow signal throughout the right ilium involving the right acetabulum, right superior pubic ramus and right inferior pubic ramus, right side of the sacrum and a smaller focus of abnormal marrow lesion in the left side of the sacrum. Findings are concerning for metastatic disease. 2. Nondisplaced pathologic fracture of the quadrilateral plate of the right acetabulum. 3. Mild muscle edema in the right gluteus minimus and medius muscles likely reflecting mild muscle strain.  MRI brain  IMPRESSION: No evidence of intracranial metastatic disease.   Indeterminate 1 cm left frontal calvarium lesion.   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  MEDICATIONS:  Current Outpatient Medications  Medication Sig Dispense Refill   albuterol  (VENTOLIN  HFA) 108 (90 Base) MCG/ACT inhaler Inhale 2 puffs into the lungs every 6 (six) hours as needed for wheezing or shortness of breath. 18 g 0   Azelastine  HCl 137 MCG/SPRAY SOLN Place 2 sprays into both nostrils 2 (two) times daily.     Cholecalciferol (VITAMIN D -3 PO) Take by mouth.     cyclobenzaprine  (FLEXERIL ) 5 MG tablet Take 1 tablet (5 mg total) by mouth 2 (two) times daily as needed for muscle spasms. 30 tablet 0   dexamethasone  (DECADRON ) 2 MG tablet Take 1 tablet (2 mg total) by mouth 2 (two) times daily with a meal. Take as directed by MD 11 tablet 0   fluticasone  (FLONASE ) 50 MCG/ACT nasal spray USE TWO SPRAYS IN EACH NOSTRIL ONCE DAILY prn (Patient not taking: Reported on 09/03/2023) 16 g 12   lactulose  (CHRONULAC ) 10 GM/15ML solution Take 15 mLs (10 g total) by mouth 3 (three) times daily. 236 mL 0   lansoprazole  (PREVACID ) 15 MG capsule Take 1 capsule (  15 mg total) by mouth daily at 12 noon for 12 days. 12 capsule 0   loratadine  (CLARITIN ) 10 MG tablet TAKE ONE TABLET BY MOUTH DAILY AS NEEDED FOR ALLERGIES 90 tablet 3   losartan  (COZAAR ) 100 MG tablet Take 1 tablet (100 mg total) by mouth daily. 90 tablet 3   magic mouthwash w/lidocaine  SOLN Take 5 mLs by mouth 4 (four) times daily as needed for mouth pain. Sig: Swish/Swallow 5-10 ml four times a day as needed. Dispense 480 ml. 1RF 480 mL 1   mirtazapine  (REMERON ) 15 MG tablet Take 1 tablet (15 mg total) by mouth at bedtime. 30 tablet 0   naloxone  (NARCAN ) nasal spray 4 mg/0.1 mL SPRAY 1 SPRAY INTO ONE NOSTRIL AS DIRECTED FOR OPIOID OVERDOSE (TURN PERSON ON SIDE AFTER DOSE. IF NO RESPONSE IN 2-3 MINUTES OR PERSON RESPONDS BUT RELAPSES, REPEAT USING A NEW SPRAY DEVICE AND SPRAY INTO  THE OTHER NOSTRIL. CALL 911 AFTER USE.) * EMERGENCY USE ONLY * 1 each 0   ondansetron  (ZOFRAN ) 8 MG tablet Take 1 tablet (8 mg total) by mouth every 8 (eight) hours as needed for nausea or vomiting. 30 tablet 3   oxyCODONE  (OXY IR/ROXICODONE ) 5 MG immediate release tablet Take 1-2 tablets (5-10 mg total) by mouth every 4 (four) hours as needed for severe pain (pain score 7-10). (Patient not taking: Reported on 09/03/2023) 60 tablet 0   oxyCODONE  ER (XTAMPZA  ER) 9 MG C12A Take 1 capsule by mouth every 12 (twelve) hours. 60 capsule 0   senna (SENOKOT) 8.6 MG TABS tablet Take 1 tablet by mouth daily.     No current facility-administered medications for this visit.   Facility-Administered Medications Ordered in Other Visits  Medication Dose Route Frequency Provider Last Rate Last Admin   heparin  lock flush 100 UNIT/ML injection            heparin  lock flush 100 unit/mL  500 Units Intravenous Once Agrawal, Kavita, MD       sodium chloride  flush (NS) 0.9 % injection 10 mL  10 mL Intravenous Once Agrawal, Kavita, MD        VITAL SIGNS: There were no vitals taken for this visit. There were no vitals filed for this visit.   Estimated body mass index is 26.05 kg/m as calculated from the following:   Height as of 07/31/23: 4' 11 (1.499 m).   Weight as of 09/03/23: 129 lb (58.5 kg).  LABS: CBC:    Component Value Date/Time   WBC 7.7 09/03/2023 0835   WBC 4.7 05/26/2023 1400   HGB 8.1 (L) 09/03/2023 0835   HCT 25.2 (L) 09/03/2023 0835   PLT 70 (L) 09/03/2023 0835   MCV 104.6 (H) 09/03/2023 0835   NEUTROABS 6.0 09/03/2023 0835   LYMPHSABS 0.9 09/03/2023 0835   MONOABS 0.5 09/03/2023 0835   EOSABS 0.1 09/03/2023 0835   BASOSABS 0.0 09/03/2023 0835   Comprehensive Metabolic Panel:    Component Value Date/Time   NA 135 09/03/2023 0835   K 3.9 09/03/2023 0835   CL 107 09/03/2023 0835   CO2 18 (L) 09/03/2023 0835   BUN 25 (H) 09/03/2023 0835   CREATININE 1.28 (H) 09/03/2023 0835   GLUCOSE  115 (H) 09/03/2023 0835   CALCIUM 8.3 (L) 09/03/2023 0835   AST 20 09/03/2023 0835   ALT 16 09/03/2023 0835   ALKPHOS 242 (H) 09/03/2023 0835   BILITOT 0.4 09/03/2023 0835   PROT 7.2 09/03/2023 0835   ALBUMIN 3.6 09/03/2023 0835  RADIOGRAPHIC STUDIES: MR Brain W Wo Contrast Result Date: 09/12/2023 CLINICAL DATA:  73 year old female with metastatic lung cancer. Skull base metastases with dural thickening in April. Previously treated left temporal occipital junction metastasis which has been slowly enlarging. Restaging. EXAM: MRI HEAD WITHOUT AND WITH CONTRAST TECHNIQUE: Multiplanar, multiecho pulse sequences of the brain and surrounding structures were obtained without and with intravenous contrast. CONTRAST:  5mL GADAVIST  GADOBUTROL  1 MMOL/ML IV SOLN COMPARISON:  Brain MRI 08/08/2023 and earlier. FINDINGS: Brain: Thick and mildly nodular/irregular dural thickening along the bilateral middle cranial fossa redemonstrated (series 19, images 58 and 66) and largely new since January, not significantly changed from last month. No convincing cavernous sinus involvement. Dorsal clivus involvement (series 21, image 14) also confirmed new from January. More subtle superior convexity pachymeningeal asymmetric thickening (series 19, image 119) is stable since last year. See also skull findings below. Peripheral oval and mildly lobulated 17 x 13 x 7 mm (AP by transverse by CC) enhancing metastasis in April was 17 x 14 by 8 mm. Mild regional vasogenic appearing T2 and FLAIR hyperintensity is stable (series 11, image 22) there is no regional mass effect. Chronic hemosiderin associated. No other intra-axial metastasis is identified. No restricted diffusion to suggest acute infarction. No midline shift, ventriculomegaly, extra-axial collection or acute intracranial hemorrhage. Cervicomedullary junction and pituitary are within normal limits. No other chronic cerebral blood products on SWI. No new signal abnormality in  the brain, scattered mild for age patchy additional white matter T2 and FLAIR hyperintensity. Vascular: Major intracranial vascular flow voids are stable. Following contrast the major dural venous sinuses are enhancing and appear to be patent. Skull and upper cervical spine: Widespread bone metastases, infiltrative of the calvarium and skull base. Stable appearance since January. Negative visible cervical spinal cord. Sinuses/Orbits: Stable and negative. Other: Mastoids remain clear. Visible internal auditory structures appear normal. IMPRESSION: 1. Solitary intra-axial enhancing metastasis along the inferior junction of the left temporal/occipital lobes is stable to perhaps 1 mm smaller since 08/08/2023. Stable mild regional edema without mass effect. 2. Diffuse osseous metastatic disease. Progressed since January but stable since last month is Pachymeningeal Dural Involvement with thickening and enhancement in the bilateral middle cranial fossa, over the dorsal clivus. No overt cavernous sinus involvement or other complicating features at this time. 3. No new metastatic disease identified. Electronically Signed   By: Marlise Simpers M.D.   On: 09/12/2023 10:49   NM PET Image Restag (PS) Skull Base To Thigh Result Date: 09/02/2023 CLINICAL DATA:  Subsequent treatment strategy for non-small-cell lung cancer. EXAM: NUCLEAR MEDICINE PET SKULL BASE TO THIGH TECHNIQUE: 6.98 mCi F-18 FDG was injected intravenously. Full-ring PET imaging was performed from the skull base to thigh after the radiotracer. CT data was obtained and used for attenuation correction and anatomic localization. Fasting blood glucose: 96 mg/dl COMPARISON:  PET-CT scan 05/16/2023. Abdomen pelvis CT 06/02/2023. Shoulder and hip MRI examinations January 2025 as well FINDINGS: Mediastinal blood pool activity: SUV max 2.7 Liver activity: SUV max 3.7 NECK: Previously there is extensive brown fat uptake. Today there are some areas uptake along small nodes in  the neck which are new. Example includes left supraclavicular with maximum SUV value of 3.8. On image 26 of the CT scan this node measures 6 x 10 mm. New from previous. Small node more superiorly along the jugular chain zone 2 has maximum SUV of 3.0. The small node deep to the sternocleidomastoid muscle measures 10 x 5 mm. Also new. Node just caudal  to this in zone 3 has maximum SUV value of 6.1 and this lymph node measures 8 x 8 mm. No abnormal uptake along right-sided nodes. Near symmetric uptake of the visualized intracranial compartment. Incidental CT findings: Paranasal sinuses and mastoid air cells are clear. Streak artifact related to the patient's dental hardware. The parotid glands, submandibular glands and thyroid  gland are preserved. CHEST: Left upper lobe spiculated mass again identified. Previously this had maximum SUV value 5.9. Today 7.5. On the prior the spiculated nodule was measured at 2.0 x 1.4 cm. Today when measured in a similar fashion 2.2 by 1.4 cm. The adjacent small areas of nodularity extending anteromedial is slightly larger. This area today measure 18 by 8 mm on image 40 and previously 12 x 9 mm. Uptake in this location is also particularly increased. This could be disease progression. In addition there is an area of uptake just lateral to this area in the left upper lobe with maximum SUV 3.0. This corresponds to a small area of ground-glass on image 41 measuring 8 mm. The area was seen previously but the uptake has increased. Previously only 1.2. Similar focus seen posteriorly in left upper lobe on image 39 with maximum SUV of 2.7. There is an increasing nodule in this location measuring 7 mm. There are several small areas of uptake identified elsewhere in both lungs. There are increasing areas tiny nodules scattered in both lungs particularly in the upper lobes. Vast majority do not show any abnormal uptake but are quite small. These have a differential including lung metastases. Focus  of increased uptake as well along left side of the mediastinum/hilum with maximum SUV of 4.8. This is seen on image 44 and could be a small lymph node along the AP window which is new measuring 5 mm. No other definite abnormal lymph node uptake identified this time. There is some mild uptake along the lower esophagus which is nonspecific. Incidental CT findings: Right IJ chest port. Heart is nonenlarged. No significant pericardial effusion. The thoracic aorta is normal course and caliber. Mild calcified plaque. No pleural effusion or pneumothorax. Breathing motion. ABDOMEN/PELVIS: There is physiologic distribution radiotracer along the parenchymal organs, bowel and renal collecting systems. Incidental CT findings: Gallstones. Grossly the liver, spleen, adrenal glands are unremarkable. No abnormal calcifications seen within either kidney nor along the expected course of either ureter. Underdistended urinary bladder. Large bowel is normal course and caliber. Scattered colonic stool. Normal retrocecal appendix. Stomach and small bowel are nondilated. SKELETON: Once again there is extensive areas of abnormal uptake along the skeleton consistent with known osseous metastatic disease. Again there is extensive involvement of the spine, pelvis, sacrum, femurs, humeri, sternum. There are several areas that are increasing in particular throughout the thoracic spine, proximal humeri, right femoral shaft. There is also increasing areas along lumbar spine with now on ball of mint more diffusely for example at the L2 level. Left femur intramedullary rod identified. Incidental CT findings: Curvature of spine. Diffuse degenerative changes. Stable areas of compression such as at L4. IMPRESSION: Overall progression of disease. Extensive osseous metastatic disease which has further increased today diffusely. The primary lesion left upper lobe centrally within the spiculated area is similar to previous with slightly more uptake.  However the surrounding areas of nodularity to this main lesion have increased in uptake and size. In addition there are several scattered bilateral small lung nodules which subjectively of increase in size and now some show increasing uptake. New small mildly hypermetabolic left-sided mediastinal AP  window lymph node as well as some small nodes in the left neck. Electronically Signed   By: Adrianna Horde M.D.   On: 09/02/2023 13:08    PERFORMANCE STATUS (ECOG) : 1 - Symptomatic but completely ambulatory  Review of Systems Unless otherwise noted, a complete review of systems is negative.  Physical Exam General: NAD Cardiovascular: regular rate and rhythm Pulmonary: clear anterior/posterior fields Abdomen: soft, nontender, + bowel sounds GU: no suprapubic tenderness Extremities: no edema, no joint deformities Skin: no rashes Neurological: Weakness but otherwise nonfocal  IMPRESSION/PLAN: Stage IV lung cancer -awaiting evaluation at [facility] later today.  Patient has follow-up with Dr. Randy Buttery on 10/03/2023.  Nausea/vomiting -likely secondary to progressive cancer.  Patient is not routinely taking her ondansetron , which I encouraged her to do.  We also discussed retrial of low-dose olanzapine  at bedtime.  Patient wanted to think about that option.  Will start patient on low-dose dexamethasone  as this may help with nausea, appetite, and fatigue.  Proceed with IV fluids and supportive care.  Hypocalcemia -replete with IV today.  Continue oral supplement.  Recheck labs when patient returns to clinic  Neoplasm pain -continue Xtampza /oxycodone .  Case and plan discussed with Dr. Randy Buttery  MD visit later this week   Patient expressed understanding and was in agreement with this plan. She also understands that She can call clinic at any time with any questions, concerns, or complaints.   Thank you for allowing me to participate in the care of this very pleasant patient.   Time Total: 25 minutes  Visit  consisted of counseling and education dealing with the complex and emotionally intense issues of symptom management in the setting of serious illness.Greater than 50%  of this time was spent counseling and coordinating care related to the above assessment and plan.  Signed by: Gerilyn Kobus, PhD, NP-C

## 2023-10-01 NOTE — Telephone Encounter (Signed)
 Spoke with daughter. Pt c/o headaches, nausea and vomiting over the last few days as well as decreased appetite patient is attempting to eat some oatmeal at this time. Last took zofran  8 mg around 5 this am. Daughter requesting pt to have iv fluids if possible. Arranged for smc visit today at 55 with port labs- see josh and IV fluids.

## 2023-10-01 NOTE — Telephone Encounter (Signed)
 Can one of you coordinate guardant liquid test for her on Friday?

## 2023-10-01 NOTE — Telephone Encounter (Signed)
 Scheduled patient for smc at 11- port labs / smc/-Josh and possible iv fluids.

## 2023-10-02 ENCOUNTER — Encounter: Payer: Self-pay | Admitting: Internal Medicine

## 2023-10-03 ENCOUNTER — Inpatient Hospital Stay (HOSPITAL_BASED_OUTPATIENT_CLINIC_OR_DEPARTMENT_OTHER): Admitting: Oncology

## 2023-10-03 ENCOUNTER — Telehealth: Payer: Self-pay | Admitting: Oncology

## 2023-10-03 ENCOUNTER — Inpatient Hospital Stay

## 2023-10-03 ENCOUNTER — Encounter: Payer: Self-pay | Admitting: Oncology

## 2023-10-03 VITALS — BP 139/97 | HR 88 | Temp 98.1°F | Resp 19 | Ht 59.0 in | Wt 128.9 lb

## 2023-10-03 DIAGNOSIS — Z95828 Presence of other vascular implants and grafts: Secondary | ICD-10-CM

## 2023-10-03 DIAGNOSIS — D61810 Antineoplastic chemotherapy induced pancytopenia: Secondary | ICD-10-CM | POA: Diagnosis not present

## 2023-10-03 DIAGNOSIS — C3490 Malignant neoplasm of unspecified part of unspecified bronchus or lung: Secondary | ICD-10-CM

## 2023-10-03 DIAGNOSIS — Z7189 Other specified counseling: Secondary | ICD-10-CM | POA: Diagnosis not present

## 2023-10-03 DIAGNOSIS — C3412 Malignant neoplasm of upper lobe, left bronchus or lung: Secondary | ICD-10-CM | POA: Diagnosis not present

## 2023-10-03 LAB — CBC WITH DIFFERENTIAL (CANCER CENTER ONLY)
Abs Immature Granulocytes: 0.03 10*3/uL (ref 0.00–0.07)
Basophils Absolute: 0 10*3/uL (ref 0.0–0.1)
Basophils Relative: 0 %
Eosinophils Absolute: 0.1 10*3/uL (ref 0.0–0.5)
Eosinophils Relative: 1 %
HCT: 28 % — ABNORMAL LOW (ref 36.0–46.0)
Hemoglobin: 9.3 g/dL — ABNORMAL LOW (ref 12.0–15.0)
Immature Granulocytes: 1 %
Lymphocytes Relative: 12 %
Lymphs Abs: 0.7 10*3/uL (ref 0.7–4.0)
MCH: 34.1 pg — ABNORMAL HIGH (ref 26.0–34.0)
MCHC: 33.2 g/dL (ref 30.0–36.0)
MCV: 102.6 fL — ABNORMAL HIGH (ref 80.0–100.0)
Monocytes Absolute: 0.5 10*3/uL (ref 0.1–1.0)
Monocytes Relative: 9 %
Neutro Abs: 4.5 10*3/uL (ref 1.7–7.7)
Neutrophils Relative %: 77 %
Platelet Count: 144 10*3/uL — ABNORMAL LOW (ref 150–400)
RBC: 2.73 MIL/uL — ABNORMAL LOW (ref 3.87–5.11)
RDW: 14.3 % (ref 11.5–15.5)
WBC Count: 5.9 10*3/uL (ref 4.0–10.5)
nRBC: 0 % (ref 0.0–0.2)

## 2023-10-03 LAB — CMP (CANCER CENTER ONLY)
ALT: 11 U/L (ref 0–44)
AST: 24 U/L (ref 15–41)
Albumin: 3.6 g/dL (ref 3.5–5.0)
Alkaline Phosphatase: 307 U/L — ABNORMAL HIGH (ref 38–126)
Anion gap: 10 (ref 5–15)
BUN: 18 mg/dL (ref 8–23)
CO2: 17 mmol/L — ABNORMAL LOW (ref 22–32)
Calcium: 8.5 mg/dL — ABNORMAL LOW (ref 8.9–10.3)
Chloride: 106 mmol/L (ref 98–111)
Creatinine: 1.22 mg/dL — ABNORMAL HIGH (ref 0.44–1.00)
GFR, Estimated: 47 mL/min — ABNORMAL LOW (ref >60)
Glucose, Bld: 119 mg/dL — ABNORMAL HIGH (ref 70–99)
Potassium: 3.9 mmol/L (ref 3.5–5.1)
Sodium: 133 mmol/L — ABNORMAL LOW (ref 135–145)
Total Bilirubin: 0.6 mg/dL (ref 0.0–1.2)
Total Protein: 7 g/dL (ref 6.5–8.1)

## 2023-10-03 MED ORDER — HEPARIN SOD (PORK) LOCK FLUSH 100 UNIT/ML IV SOLN
500.0000 [IU] | Freq: Once | INTRAVENOUS | Status: AC
  Administered 2023-10-03: 500 [IU] via INTRAVENOUS
  Filled 2023-10-03: qty 5

## 2023-10-03 MED ORDER — SODIUM CHLORIDE 0.9% FLUSH
10.0000 mL | Freq: Once | INTRAVENOUS | Status: AC
  Administered 2023-10-03: 10 mL via INTRAVENOUS
  Filled 2023-10-03: qty 10

## 2023-10-03 NOTE — Addendum Note (Signed)
 Addended by: Anthony Bateman on: 10/03/2023 10:18 AM   Modules accepted: Orders

## 2023-10-03 NOTE — Telephone Encounter (Signed)
 Patient has appointment request in the workqueue for 5/29. Please update the plan. Thanks!

## 2023-10-03 NOTE — Telephone Encounter (Signed)
 Rx plan discontinued

## 2023-10-03 NOTE — Progress Notes (Signed)
No iv fluids needed

## 2023-10-03 NOTE — Progress Notes (Unsigned)
 Patient's daughter states patient has been having headaches. She says the headaches started a little while after starting radiation.   Patient states when the headaches are present, on a pain scale from 1-10 they are about a 2. The headaches generally occur on the left side of her head, which is also where she has present lesions.

## 2023-10-04 ENCOUNTER — Encounter: Payer: Self-pay | Admitting: Internal Medicine

## 2023-10-04 NOTE — Progress Notes (Signed)
 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 Imaging with CXR followed by CT chest showed Spiculated 2.1 x 2.2 cm left upper lobe pulmonary nodule with associated pleural tethering. Innumerable subcentimeter pulmonary nodules throughout the lungs.      11/14/2021 PET scan   IMPRESSION: 1. Left suprahilar nodule with maximum SUV 4.5, and a more distal 2.2 cm left upper lobe nodule with maximum SUV of 5.5. 2. Innumerable scattered small pulmonary nodules, most of which are under 5 mm in diameter, throughout both lungs. Some are cavitary. Possibilities include cavitating hematogenous disseminated malignancy versus infectious etiology subset as septic emboli. Most of the nodules are stable, some are minimally enlarged compared to 10/10/2021. 3. Abnormal mild permeative type bony findings in the right hemipelvis associated with substantially accentuated metabolic activity. Possible associated pathologic fracture through the right quadrilateral plate and right acetabulum. The findings involve the right ilium and ischium but also the right lateral sacral ala and a small focus in the left sacrum.    Pathology Results   She had transbronchial biopsies by Dr. Viva Grise on 11/19/21.   Pathology from LUL nodule showed adenocarcinoma, consistent with lung primary.    11/19/2021 Cancer Staging   Staging form: Lung, AJCC 8th Edition - Clinical stage from 11/19/2021: Stage IVB (cT1c, cM1c) - Signed by Loreatha Rodney, MD on 11/27/2021 Stage prefix: Initial diagnosis   11/27/2021 Imaging   MRI pelvis  IMPRESSION: 1. Abnormal marrow signal throughout the right ilium involving the right acetabulum, right superior pubic ramus and right inferior pubic ramus, right side of the sacrum and a smaller focus of abnormal marrow lesion in the left side of the sacrum. Findings are concerning for metastatic disease. 2. Nondisplaced pathologic fracture of the quadrilateral plate of the right acetabulum. 3. Mild muscle edema in the right gluteus minimus and medius muscles likely reflecting mild muscle strain.  MRI brain  IMPRESSION: No evidence of intracranial metastatic disease.   Indeterminate 1 cm left frontal calvarium lesion.   12/18/2021 - 08/27/2022 Chemotherapy   Patient is on Treatment Plan : LUNG Carboplatin  (5) + Pemetrexed  (500) + Pembrolizumab  (200) D1 q21d Induction x 4 cycles / Maintenance Pemetrexed  (500) + Pembrolizumab  (200) D1 q21d     10/07/2022 - 01/21/2023 Chemotherapy   Patient is on Treatment Plan : LUNG Carboplatin  + Paclitaxel  q21d Dose Reduction     03/27/2023 - 04/24/2023 Chemotherapy   Patient is on Treatment Plan : LUNG Bevacizumab  q21d     06/26/2023 - 08/21/2023 Chemotherapy   Patient is on Treatment Plan : LUNG Gemcitabine  (1000) D1,8 q21d     Primary malignant neoplasm of left upper lobe of lung (HCC)  06/04/2022 Initial Diagnosis   Primary malignant neoplasm of left upper lobe of lung (HCC)   06/04/2022 Cancer Staging   Staging form: Lung, AJCC 8th Edition - Pathologic:  Stage IVB (pT1c, pNX, cM1c) - Signed by Brahmanday, Govinda R, MD on 06/04/2022      Interval history- appetite is fair. Weight has remained stable. She has ongoing fatigue  ECOG PS-  2 Pain scale- 3   Review of systems- Review of Systems  Constitutional:  Positive for malaise/fatigue. Negative for chills, fever and weight loss.  HENT:  Negative for congestion, ear discharge and nosebleeds.   Eyes:  Negative for blurred vision.  Respiratory:  Negative for cough, hemoptysis, sputum production, shortness of breath and wheezing.   Cardiovascular:  Negative for chest pain, palpitations, orthopnea and claudication.  Gastrointestinal:  Negative for abdominal pain, blood in stool, constipation, diarrhea, heartburn, melena, nausea and vomiting.  Genitourinary:  Negative for dysuria, flank pain, frequency, hematuria and urgency.  Musculoskeletal:  Negative for back pain, joint pain and myalgias.  Skin:  Negative for rash.  Neurological:  Negative for dizziness, tingling, focal weakness, seizures, weakness and headaches.  Endo/Heme/Allergies:  Does not bruise/bleed easily.  Psychiatric/Behavioral:  Negative for depression and suicidal ideas. The patient does not have insomnia.       Allergies  Allergen Reactions   Diclofenac Hives   Lisinopril     cough   Sulfa  Antibiotics     ? 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   COVID-19    03/2021   Hypertension    Pneumonia    age 73   Pre-diabetes    Prediabetes    Sciatica      Past Surgical History:  Procedure Laterality Date   BRONCHIAL BIOPSY  12/17/2022   Procedure: BRONCHIAL BIOPSIES;  Surgeon: Prudy Brownie, DO;  Location: [facility];  Service: Pulmonary;;   BRONCHIAL NEEDLE ASPIRATION BIOPSY  12/17/2022   Procedure: BRONCHIAL NEEDLE ASPIRATION BIOPSIES;  Surgeon: Prudy Brownie, DO;  Location: [facility];  Service: Pulmonary;;   CESAREAN SECTION     COLONOSCOPY W/ POLYPECTOMY     ECTOPIC PREGNANCY  SURGERY     FEMUR SURGERY     left, s/p rod for fracture   IR IMAGING GUIDED PORT INSERTION  12/11/2021   TUBAL LIGATION      Social History   Socioeconomic History   Marital status: Widowed    Spouse name: Not on file   Number of children: Not on file   Years of education: Not on file   Highest education level: Some college, no degree  Occupational History   Not on file  Tobacco Use   Smoking status: Never    Passive exposure: Never   Smokeless tobacco: Never  Vaping Use   Vaping status: Never Used  Substance and Sexual Activity   Alcohol use: Never   Drug use: Never   Sexual activity: Not Currently  Other Topics Concern   Not on file  Social History Narrative   Lives in [place] but husband died 2020-06-19 motorcycle accident bday 10/19/20 married 47 years   No pets   2 daughters and 3 granddaughters      Work - [employer] retired 09/2019   Diet - regular diet   Exercise - none   Social Drivers of Health   Financial Resource Strain: Low Risk  (06/18/2023)   Received from  [facility]   Overall Financial Resource Strain (CARDIA)    Difficulty of Paying Living Expenses: Not hard at all  Food Insecurity: No Food Insecurity (06/18/2023)   Received from [facility]   Hunger Vital Sign    Within the past 12 months, you worried that your food would run out before you got the money to buy more.: Never true    Within the past 12 months, the food you bought just didn't last and you didn't have money to get more.: Never true  Transportation Needs: No Transportation Needs (06/18/2023)   Received from [facility] - Transportation    In the past 12 months, has lack of transportation kept you from medical appointments or from getting medications?: No    Lack of Transportation (Non-Medical): No  Physical Activity: Inactive (05/27/2023)   Exercise Vital Sign    Days of Exercise per Week: 0 days    Minutes of Exercise  per Session: 0 min  Stress: No Stress Concern Present (05/27/2023)   [employer] of Occupational Health - Occupational Stress Questionnaire    Feeling of Stress : Not at all  Social Connections: Moderately Isolated (05/27/2023)   Social Connection and Isolation Panel    Frequency of Communication with Friends and Family: More than three times a week    Frequency of Social Gatherings with Friends and Family: Once a week    Attends Religious Services: 1 to 4 times per year    Active Member of [employer] or Organizations: No    Attends Banker Meetings: Never    Marital Status: Widowed  Intimate Partner Violence: Not At Risk (02/05/2023)   Humiliation, Afraid, Rape, and Kick questionnaire    Fear of Current or Ex-Partner: No    Emotionally Abused: No    Physically Abused: No    Sexually Abused: No    Family History  Problem Relation Age of Onset   Diabetes Mother    Hypertension Mother    Heart disease Mother    Cancer Mother        breast and stomach   Breast cancer Mother 30   Diabetes Father    Hypertension Father    Heart disease Father    Kidney disease Father      Current Outpatient Medications:    albuterol  (VENTOLIN  HFA) 108 (90 Base) MCG/ACT inhaler, Inhale 2 puffs into the lungs every 6 (six) hours as needed for wheezing or shortness of breath., Disp: 18 g, Rfl: 0   Azelastine  HCl 137 MCG/SPRAY SOLN, Place 2 sprays into both nostrils 2 (two) times daily., Disp: , Rfl:    calcium  carbonate (OSCAL) 1500 (600 Ca) MG TABS tablet, Take 600 mg of elemental calcium  by mouth 3 (three) times daily with meals., Disp: , Rfl:    cetirizine (ZYRTEC) 5 MG tablet, Take 5 mg by mouth daily., Disp: , Rfl:    Cholecalciferol (VITAMIN D -3 PO), Take by mouth., Disp: , Rfl:    dexamethasone  (DECADRON ) 2 MG tablet, Take 1 tablet (2 mg total) by mouth daily., Disp: 14 tablet, Rfl: 0   lidocaine -prilocaine  (EMLA ) cream, Apply topically., Disp: , Rfl:    loratadine  (CLARITIN ) 10 MG  tablet, TAKE ONE TABLET BY MOUTH DAILY AS NEEDED FOR ALLERGIES, Disp: 90 tablet, Rfl: 3   losartan  (COZAAR ) 100 MG tablet, Take 1 tablet (100 mg total) by mouth daily., Disp: 90 tablet, Rfl: 3   ondansetron  (ZOFRAN ) 8 MG tablet, Take  1 tablet (8 mg total) by mouth every 8 (eight) hours as needed for nausea or vomiting., Disp: 30 tablet, Rfl: 3   oxyCODONE  (OXY IR/ROXICODONE ) 5 MG immediate release tablet, Take 1-2 tablets (5-10 mg total) by mouth every 4 (four) hours as needed for severe pain (pain score 7-10)., Disp: 60 tablet, Rfl: 0   oxyCODONE  ER (XTAMPZA  ER) 9 MG C12A, Take 1 capsule by mouth every 12 (twelve) hours., Disp: 60 capsule, Rfl: 0   senna (SENOKOT) 8.6 MG TABS tablet, Take 1 tablet by mouth daily., Disp: , Rfl:    fluticasone  (FLONASE ) 50 MCG/ACT nasal spray, USE TWO SPRAYS IN EACH NOSTRIL ONCE DAILY prn (Patient not taking: Reported on 10/03/2023), Disp: 16 g, Rfl: 12   lactulose  (CHRONULAC ) 10 GM/15ML solution, Take 15 mLs (10 g total) by mouth 3 (three) times daily. (Patient not taking: Reported on 10/03/2023), Disp: 236 mL, Rfl: 0   lansoprazole  (PREVACID ) 15 MG capsule, Take 1 capsule (15 mg total) by mouth daily at 12 noon for 12 days. (Patient not taking: Reported on 10/03/2023), Disp: 12 capsule, Rfl: 0   magic mouthwash w/lidocaine  SOLN, Take 5 mLs by mouth 4 (four) times daily as needed for mouth pain. Sig: Swish/Swallow 5-10 ml four times a day as needed. Dispense 480 ml. 1RF (Patient not taking: Reported on 10/03/2023), Disp: 480 mL, Rfl: 1   naloxone  (NARCAN ) nasal spray 4 mg/0.1 mL, SPRAY 1 SPRAY INTO ONE NOSTRIL AS DIRECTED FOR OPIOID OVERDOSE (TURN PERSON ON SIDE AFTER DOSE. IF NO RESPONSE IN 2-3 MINUTES OR PERSON RESPONDS BUT RELAPSES, REPEAT USING A NEW SPRAY DEVICE AND SPRAY INTO THE OTHER NOSTRIL. CALL 911 AFTER USE.) * EMERGENCY USE ONLY * (Patient not taking: Reported on 10/03/2023), Disp: 1 each, Rfl: 0 No current facility-administered medications for this  visit.  Facility-Administered Medications Ordered in Other Visits:    heparin  lock flush 100 UNIT/ML injection, , , ,    heparin  lock flush 100 unit/mL, 500 Units, Intravenous, Once, Agrawal, Kavita, MD   sodium chloride  flush (NS) 0.9 % injection 10 mL, 10 mL, Intravenous, Once, Agrawal, Kavita, MD  Physical exam:  Vitals:   10/03/23 0946  BP: (!) 139/97  Pulse: 88  Resp: 19  Temp: 98.1 F (36.7 C)  TempSrc: Tympanic  SpO2: 100%  Weight: 128 lb 14.4 oz (58.5 kg)  Height: 4' 11 (1.499 m)   Physical Exam Constitutional:      Comments: Sitting in a wheelchair. Appears in no acute distress   Eyes:     Pupils: Pupils are equal, round, and reactive to light.    Cardiovascular:     Rate and Rhythm: Normal rate and regular rhythm.     Heart sounds: Normal heart sounds.  Pulmonary:     Effort: Pulmonary effort is normal.     Breath sounds: Normal breath sounds.  Abdominal:     General: Bowel sounds are normal.     Palpations: Abdomen is soft.   Musculoskeletal:     Cervical back: Normal range of motion.     Comments: B/l+1 edema   Skin:    General: Skin is warm and dry.   Neurological:     Mental Status: She is alert and oriented to person, place, and time.      I have personally reviewed labs listed below:    Latest Ref Rng & Units 10/03/2023    9:20 AM  CMP  Glucose 70 - 99 mg/dL 102   BUN 8 - 23  mg/dL 18   Creatinine 6.21 - 1.00 mg/dL 3.08   Sodium 657 - 846 mmol/L 133   Potassium 3.5 - 5.1 mmol/L 3.9   Chloride 98 - 111 mmol/L 106   CO2 22 - 32 mmol/L 17   Calcium  8.9 - 10.3 mg/dL 8.5   Total Protein 6.5 - 8.1 g/dL 7.0   Total Bilirubin 0.0 - 1.2 mg/dL 0.6   Alkaline Phos 38 - 126 U/L 307   AST 15 - 41 U/L 24   ALT 0 - 44 U/L 11       Latest Ref Rng & Units 10/03/2023    9:20 AM  CBC  WBC 4.0 - 10.5 K/uL 5.9   Hemoglobin 12.0 - 15.0 g/dL 9.3   Hematocrit 96.2 - 46.0 % 28.0   Platelets 150 - 400 K/uL 144      Assessment and plan- Patient is a  73 y.o. female with metastatic adenocarcinoma of the lung here to discuss further management   Patient had a virtual visit with Dr. Fulton Job from [facility] yesterday.  I have also personally spoken to Dr. Fulton Job regarding patient's for the management of stage IV adenocarcinoma of the lung.  Her treatment has been complicated by chemo induced pancytopenia which takes weeks to recover.  She also has baseline peripheral neuropathy.  It was recommended that she should get a repeat peripheral blood guardant NGS testing which we have sent out today.  Her prior testing from her lung specimen did not show any evidence of actionable mutations but also had a low tumor burden and certain mutations such as ERBB2 could not be tested on that specimen.  Discussed with patient and daughter that these test results will take another 2 weeks to be back.  If this test results do not show any evidence of targetable mutation, we could consider dose reduced docetaxel but given her cytopenias she will not be able to get it every 3 weeks but we are looking more like every 6 weeks but could come with additional toxicities such as nausea vomiting diarrhea low blood counts risk of infections hospitalizations and worsening peripheral neuropathy.  I will see the patient back in 2 to 3 weeks time to discuss the results of NGS testing and see if she still wishes to pursue with additional treatment.   Visit Diagnosis 1. Goals of care, counseling/discussion   2. Antineoplastic chemotherapy induced pancytopenia (CODE) (HCC)      Dr. Seretha Dance, MD, MPH [facility] at [facility] [phone] 10/04/2023 4:01 PM

## 2023-10-06 ENCOUNTER — Encounter: Payer: Self-pay | Admitting: Oncology

## 2023-10-06 ENCOUNTER — Encounter: Payer: Self-pay | Admitting: Internal Medicine

## 2023-10-07 ENCOUNTER — Encounter: Payer: Self-pay | Admitting: Internal Medicine

## 2023-10-07 ENCOUNTER — Other Ambulatory Visit: Payer: Self-pay | Admitting: *Deleted

## 2023-10-07 ENCOUNTER — Ambulatory Visit
Admission: RE | Admit: 2023-10-07 | Discharge: 2023-10-07 | Disposition: A | Discharge status: Home / Self Care | Source: Ambulatory Visit | Attending: Oncology | Admitting: Oncology

## 2023-10-07 ENCOUNTER — Telehealth: Payer: Self-pay | Admitting: *Deleted

## 2023-10-07 DIAGNOSIS — C7931 Secondary malignant neoplasm of brain: Secondary | ICD-10-CM | POA: Insufficient documentation

## 2023-10-07 DIAGNOSIS — R519 Headache, unspecified: Secondary | ICD-10-CM | POA: Insufficient documentation

## 2023-10-07 DIAGNOSIS — C3490 Malignant neoplasm of unspecified part of unspecified bronchus or lung: Secondary | ICD-10-CM | POA: Insufficient documentation

## 2023-10-07 DIAGNOSIS — C7951 Secondary malignant neoplasm of bone: Secondary | ICD-10-CM | POA: Diagnosis not present

## 2023-10-07 DIAGNOSIS — C801 Malignant (primary) neoplasm, unspecified: Secondary | ICD-10-CM | POA: Diagnosis not present

## 2023-10-07 MED ORDER — GADOBUTROL 1 MMOL/ML IV SOLN
6.0000 mL | Freq: Once | INTRAVENOUS | Status: AC | PRN
  Administered 2023-10-07: 6 mL via INTRAVENOUS

## 2023-10-07 NOTE — Telephone Encounter (Signed)
  RN spoke with Dr.Rao. MD recommends proceeding with MRI of brain - stat given worsening headaches and visual acuity changes. Pt aware and agreeabl. Order entered. Msg sent to scheduling to see when we can get the MRI scheduled.

## 2023-10-07 NOTE — Telephone Encounter (Signed)
 RN spoke with Dr.Rao. MD recommends proceeding with MRI of brain - stat given worsening headaches and visual acuity changes. Pt aware and agreeabl. Order entered. Msg sent to scheduling to see when we can get the MRI scheduled.

## 2023-10-07 NOTE — Telephone Encounter (Signed)
 RN spoke with Dr.Rao. MD recommends proceeding with MRI of brain - stat given worsening headaches. Pt aware. Order entered.

## 2023-10-07 NOTE — Telephone Encounter (Signed)
 Spoke with daughter. Reiterated the plan of care. MRI of brain scheduled this evening at 530.

## 2023-10-08 ENCOUNTER — Ambulatory Visit: Payer: Self-pay | Admitting: Oncology

## 2023-10-08 NOTE — Telephone Encounter (Signed)
 Contacted Tammy, pt's daughter. Reviewed MRI results with pt's daughter. Apt scheduled for Dr. Mark Sil- 930 am on Friday 6/20.

## 2023-10-08 NOTE — Telephone Encounter (Signed)
-----   Message from Avonne Boettcher sent at 10/08/2023  8:27 AM EDT ----- Victoria Foley shows no change. Is it possible for her to see DR. Vaslow soon for her headaches?  ----- Message ----- From: Interface, Rad Results In Sent: 10/07/2023   8:17 PM EDT To: Avonne Boettcher, MD

## 2023-10-10 ENCOUNTER — Inpatient Hospital Stay: Admitting: Internal Medicine

## 2023-10-10 ENCOUNTER — Encounter: Payer: Self-pay | Admitting: Internal Medicine

## 2023-10-10 VITALS — BP 134/82 | HR 120 | Temp 96.0°F | Resp 17 | Wt 125.0 lb

## 2023-10-10 DIAGNOSIS — G620 Drug-induced polyneuropathy: Secondary | ICD-10-CM

## 2023-10-10 DIAGNOSIS — C7931 Secondary malignant neoplasm of brain: Secondary | ICD-10-CM | POA: Diagnosis not present

## 2023-10-10 DIAGNOSIS — T451X5A Adverse effect of antineoplastic and immunosuppressive drugs, initial encounter: Secondary | ICD-10-CM | POA: Diagnosis not present

## 2023-10-10 DIAGNOSIS — C3412 Malignant neoplasm of upper lobe, left bronchus or lung: Secondary | ICD-10-CM | POA: Diagnosis not present

## 2023-10-10 MED ORDER — ASPIRIN 81 MG PO TBEC: 81.0000 mg | DELAYED_RELEASE_TABLET | Freq: Every day | ORAL | 12 refills | Status: DC

## 2023-10-10 NOTE — Progress Notes (Signed)
 Concerns today of vision changes and headache

## 2023-10-10 NOTE — Progress Notes (Signed)
 [facility] [address]  [place], [place] [address] [phone]   Interval Evaluation  Date of Service: 10/10/23 Patient Name: Victoria Foley Patient MRN: [id number] Patient DOB: 1951/04/22 Provider: Mamie Searles, MD  Identifying Statement:  Victoria Foley is a 73 y.o. female with Metastasis to brain [facility])  Chemotherapy-induced neuropathy (HCC)    Primary Cancer:  Oncologic History: Oncology History  Primary lung adenocarcinoma (HCC)  10/10/2021 Initial Diagnosis   Primary lung adenocarcinoma (HCC) Stage IV  Patient seen by PCP for wheezing for 2 weeks. Imaging with CXR followed by CT chest showed Spiculated 2.1 x 2.2 cm left upper lobe pulmonary nodule with associated pleural tethering. Innumerable subcentimeter pulmonary nodules throughout the lungs.      11/14/2021 PET scan   IMPRESSION: 1. Left suprahilar nodule with maximum SUV 4.5, and a more distal 2.2 cm left upper lobe nodule with maximum SUV of 5.5. 2. Innumerable scattered small pulmonary nodules, most of which are under 5 mm in diameter, throughout both lungs. Some are cavitary. Possibilities include cavitating hematogenous disseminated malignancy versus infectious etiology subset as septic emboli. Most of the nodules are stable, some are minimally enlarged compared to 10/10/2021. 3. Abnormal mild permeative type bony findings in the right hemipelvis associated with substantially accentuated metabolic activity. Possible associated pathologic fracture through the right quadrilateral plate and right acetabulum. The findings involve the right ilium and ischium but also the right lateral sacral ala and a small focus in the left sacrum.    Pathology Results   She had transbronchial biopsies by Dr. Viva Grise on 11/19/21.  Pathology from LUL nodule showed adenocarcinoma, consistent with lung primary.    11/19/2021 Cancer Staging   Staging form: Lung, AJCC 8th  Edition - Clinical stage from 11/19/2021: Stage IVB (cT1c, cM1c) - Signed by Loreatha Rodney, MD on 11/27/2021 Stage prefix: Initial diagnosis   11/27/2021 Imaging   MRI pelvis  IMPRESSION: 1. Abnormal marrow signal throughout the right ilium involving the right acetabulum, right superior pubic ramus and right inferior pubic ramus, right side of the sacrum and a smaller focus of abnormal marrow lesion in the left side of the sacrum. Findings are concerning for metastatic disease. 2. Nondisplaced pathologic fracture of the quadrilateral plate of the right acetabulum. 3. Mild muscle edema in the right gluteus minimus and medius muscles likely reflecting mild muscle strain.  MRI brain  IMPRESSION: No evidence of intracranial metastatic disease.   Indeterminate 1 cm left frontal calvarium lesion.   12/18/2021 - 08/27/2022 Chemotherapy   Patient is on Treatment Plan : LUNG Carboplatin  (5) + Pemetrexed  (500) + Pembrolizumab  (200) D1 q21d Induction x 4 cycles / Maintenance Pemetrexed  (500) + Pembrolizumab  (200) D1 q21d     10/07/2022 - 01/21/2023 Chemotherapy   Patient is on Treatment Plan : LUNG Carboplatin  + Paclitaxel  q21d Dose Reduction     03/27/2023 - 04/24/2023 Chemotherapy   Patient is on Treatment Plan : LUNG Bevacizumab  q21d     06/26/2023 - 08/21/2023 Chemotherapy   Patient is on Treatment Plan : LUNG Gemcitabine  (1000) D1,8 q21d     Primary malignant neoplasm of left upper lobe of lung (HCC)  06/04/2022 Initial Diagnosis   Primary malignant neoplasm of left upper lobe of lung (HCC)   06/04/2022 Cancer Staging   Staging form: Lung, AJCC 8th Edition - Pathologic: Stage IVB (pT1c, pNX, cM1c) - Signed by Gwyn Leos, MD on 06/04/2022    CNS Oncologic History 09/27/22: Completes  5 fractions to left frontal invasive calvarial metastasis (Victoria Foley) 09/11/23: SRS to L temporal metastasis (Victoria Foley)   Interval History: Victoria Foley presents today for follow up after recent MRI  brain, having completed radiosurgery to her brain metastasis last month.  Today she describes sudden onset double vision, starting six days ago.  Described as seeing objects side by side, worse when looking to the left, worse when looking long distances.  No meaningful improvement in the past few days.  Has been dosing decadron  2mg  for the past week for worsening headaches.  Still has orthopedic limitations with her gait from hip pain.  No seizures or headaches. Gemcitabine  for lung cancer curently on hold with Dr. Randy Buttery, patient evaluating other options at [facility].  H+P (11/29/22) Patient presents today to review recent CNS disease and treatments.  She completed 5 sessions of radiation for progressive left frontal calvarial metastasis in early June.  Denies neurologic symptoms at this time.  Walking is limited by hip pain from cancer.  No cognitive complaints, denies headaches and seizures.  Continues on taxol  and carboplatin  with Dr. Aris Bel.  Medications: Current Outpatient Medications on File Prior to Visit  Medication Sig Dispense Refill   albuterol  (VENTOLIN  HFA) 108 (90 Base) MCG/ACT inhaler Inhale 2 puffs into the lungs every 6 (six) hours as needed for wheezing or shortness of breath. 18 g 0   Azelastine  HCl 137 MCG/SPRAY SOLN Place 2 sprays into both nostrils 2 (two) times daily.     calcium  carbonate (OSCAL) 1500 (600 Ca) MG TABS tablet Take 600 mg of elemental calcium  by mouth 3 (three) times daily with meals.     cetirizine (ZYRTEC) 5 MG tablet Take 5 mg by mouth daily.     Cholecalciferol (VITAMIN D -3 PO) Take by mouth.     dexamethasone  (DECADRON ) 2 MG tablet Take 1 tablet (2 mg total) by mouth daily. 14 tablet 0   fluticasone  (FLONASE ) 50 MCG/ACT nasal spray USE TWO SPRAYS IN EACH NOSTRIL ONCE DAILY prn (Patient not taking: Reported on 10/03/2023) 16 g 12   lactulose  (CHRONULAC ) 10 GM/15ML solution Take 15 mLs (10 g total) by mouth 3 (three) times daily. (Patient not taking: Reported on  10/03/2023) 236 mL 0   lansoprazole  (PREVACID ) 15 MG capsule Take 1 capsule (15 mg total) by mouth daily at 12 noon for 12 days. (Patient not taking: Reported on 10/03/2023) 12 capsule 0   lidocaine -prilocaine  (EMLA ) cream Apply topically.     loratadine  (CLARITIN ) 10 MG tablet TAKE ONE TABLET BY MOUTH DAILY AS NEEDED FOR ALLERGIES 90 tablet 3   losartan  (COZAAR ) 100 MG tablet Take 1 tablet (100 mg total) by mouth daily. 90 tablet 3   magic mouthwash w/lidocaine  SOLN Take 5 mLs by mouth 4 (four) times daily as needed for mouth pain. Sig: Swish/Swallow 5-10 ml four times a day as needed. Dispense 480 ml. 1RF (Patient not taking: Reported on 10/03/2023) 480 mL 1   naloxone  (NARCAN ) nasal spray 4 mg/0.1 mL SPRAY 1 SPRAY INTO ONE NOSTRIL AS DIRECTED FOR OPIOID OVERDOSE (TURN PERSON ON SIDE AFTER DOSE. IF NO RESPONSE IN 2-3 MINUTES OR PERSON RESPONDS BUT RELAPSES, REPEAT USING A NEW SPRAY DEVICE AND SPRAY INTO THE OTHER NOSTRIL. CALL 911 AFTER USE.) * EMERGENCY USE ONLY * (Patient not taking: Reported on 10/03/2023) 1 each 0   ondansetron  (ZOFRAN ) 8 MG tablet Take 1 tablet (8 mg total) by mouth every 8 (eight) hours as needed for nausea or vomiting. 30 tablet 3  oxyCODONE  (OXY IR/ROXICODONE ) 5 MG immediate release tablet Take 1-2 tablets (5-10 mg total) by mouth every 4 (four) hours as needed for severe pain (pain score 7-10). 60 tablet 0   oxyCODONE  ER (XTAMPZA  ER) 9 MG C12A Take 1 capsule by mouth every 12 (twelve) hours. 60 capsule 0   senna (SENOKOT) 8.6 MG TABS tablet Take 1 tablet by mouth daily.     Current Facility-Administered Medications on File Prior to Visit  Medication Dose Route Frequency Provider Last Rate Last Admin   heparin  lock flush 100 UNIT/ML injection            heparin  lock flush 100 unit/mL  500 Units Intravenous Once Agrawal, Kavita, MD       sodium chloride  flush (NS) 0.9 % injection 10 mL  10 mL Intravenous Once Agrawal, Kavita, MD        Allergies:  Allergies  Allergen  Reactions   Diclofenac Hives   Lisinopril     cough   Sulfa  Antibiotics     ? Allergy    Past Medical History:  Past Medical History:  Diagnosis Date   Abnormal urine 06/19/2022   Anemia    Anxiety    Arthritis    Cancer (HCC)    Lung, 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   COVID-19    03/2021   Hypertension    Pneumonia    age 82   Pre-diabetes    Prediabetes    Sciatica    Past Surgical History:  Past Surgical History:  Procedure Laterality Date   BRONCHIAL BIOPSY  12/17/2022   Procedure: BRONCHIAL BIOPSIES;  Surgeon: Prudy Brownie, DO;  Location: [facility];  Service: Pulmonary;;   BRONCHIAL NEEDLE ASPIRATION BIOPSY  12/17/2022   Procedure: BRONCHIAL NEEDLE ASPIRATION BIOPSIES;  Surgeon: Prudy Brownie, DO;  Location: [facility];  Service: Pulmonary;;   CESAREAN SECTION     COLONOSCOPY W/ POLYPECTOMY     ECTOPIC PREGNANCY SURGERY     FEMUR SURGERY     left, s/p rod for fracture   IR IMAGING GUIDED PORT INSERTION  12/11/2021   TUBAL LIGATION     Social History:  Social History   Socioeconomic History   Marital status: Widowed    Spouse name: Not on file   Number of children: Not on file   Years of education: Not on file   Highest education level: Some college, no degree  Occupational History   Not on file  Tobacco Use   Smoking status: Never    Passive exposure: Never   Smokeless tobacco: Never  Vaping Use   Vaping status: Never Used  Substance and Sexual Activity   Alcohol use: Never   Drug use: Never   Sexual activity: Not Currently  Other Topics Concern   Not on file  Social History Narrative   Lives in [place] but husband died 07/11/20 motorcycle accident bday 10/19/20 married 47 years   No pets   2 daughters and 3 granddaughters      Work - [employer] retired 09/2019   Diet - regular diet   Exercise - none   Social Drivers of Corporate investment banker Strain: Low  Risk  (06/18/2023)   Received from [facility]   Overall Financial Resource Strain (CARDIA)    Difficulty of Paying Living Expenses: Not hard at all  Food Insecurity: No Food Insecurity (06/18/2023)   Received  from [facility]   Hunger Vital Sign    Within the past 12 months, you worried that your food would run out before you got the money to buy more.: Never true    Within the past 12 months, the food you bought just didn't last and you didn't have money to get more.: Never true  Transportation Needs: No Transportation Needs (06/18/2023)   Received from [facility] - Transportation    In the past 12 months, has lack of transportation kept you from medical appointments or from getting medications?: No    Lack of Transportation (Non-Medical): No  Physical Activity: Inactive (05/27/2023)   Exercise Vital Sign    Days of Exercise per Week: 0 days    Minutes of Exercise per Session: 0 min  Stress: No Stress Concern Present (05/27/2023)   Victoria Foley of Occupational Health - Occupational Stress Questionnaire    Feeling of Stress : Not at all  Social Connections: Moderately Isolated (05/27/2023)   Social Connection and Isolation Panel    Frequency of Communication with Friends and Family: More than three times a week    Frequency of Social Gatherings with Friends and Family: Once a week    Attends Religious Services: 1 to 4 times per year    Active Member of [employer] or Organizations: No    Attends Banker Meetings: Never    Marital Status: Widowed  Intimate Partner Violence: Not At Risk (02/05/2023)   Humiliation, Afraid, Rape, and Kick questionnaire    Fear of Current or Ex-Partner: No    Emotionally Abused: No    Physically Abused: No    Sexually Abused: No   Family History:  Family History  Problem Relation Age of Onset   Diabetes Mother    Hypertension Mother    Heart disease Mother    Cancer Mother         breast and stomach   Breast cancer Mother 6   Diabetes Father    Hypertension Father    Heart disease Father    Kidney disease Father     Review of Systems: Constitutional: Doesn't report fevers, chills or abnormal weight loss Eyes: Doesn't report blurriness of vision Ears, nose, mouth, throat, and face: Doesn't report sore throat Respiratory: Doesn't report cough, dyspnea or wheezes Cardiovascular: Doesn't report palpitation, chest discomfort  Gastrointestinal:  Doesn't report nausea, constipation, diarrhea GU: Doesn't report incontinence Skin: Doesn't report skin rashes Neurological: Per HPI Musculoskeletal: Doesn't report joint pain Behavioral/Psych: Doesn't report anxiety  Physical Exam: Vitals:   10/10/23 0947  BP: 134/82  Pulse: (!) 120  Resp: 17  Temp: (!) 96 F (35.6 C)  SpO2: 100%   KPS: 80. General: Alert, cooperative, pleasant, in no acute distress Head: Normal EENT: No conjunctival injection or scleral icterus.  Lungs: Resp effort normal Cardiac: Regular rate Abdomen: Non-distended abdomen Skin: No rashes cyanosis or petechiae. Extremities: No clubbing or edema  Neurologic Exam: Mental Status: Awake, alert, attentive to examiner. Oriented to self and environment. Language is fluent with intact comprehension.  Cranial Nerves: Visual acuity is grossly normal. Visual fields are full. L abducens palsy. No ptosis. Face is symmetric Motor: Tone and bulk are normal. Power is full in both arms and legs. Reflexes are symmetric, no pathologic reflexes present.  Sensory: Intact to light touch Gait: Deferred   Labs: I have reviewed the data as listed    Component Value Date/Time   NA 133 (L) 10/03/2023  0920   K 3.9 10/03/2023 0920   CL 106 10/03/2023 0920   CO2 17 (L) 10/03/2023 0920   GLUCOSE 119 (H) 10/03/2023 0920   BUN 18 10/03/2023 0920   CREATININE 1.22 (H) 10/03/2023 0920   CALCIUM  8.5 (L) 10/03/2023 0920   PROT 7.0 10/03/2023 0920   ALBUMIN 3.6  10/03/2023 0920   AST 24 10/03/2023 0920   ALT 11 10/03/2023 0920   ALKPHOS 307 (H) 10/03/2023 0920   BILITOT 0.6 10/03/2023 0920   GFRNONAA 47 (L) 10/03/2023 0920   Lab Results  Component Value Date   WBC 5.9 10/03/2023   NEUTROABS 4.5 10/03/2023   HGB 9.3 (L) 10/03/2023   HCT 28.0 (L) 10/03/2023   MCV 102.6 (H) 10/03/2023   PLT 144 (L) 10/03/2023    Imaging:  [facility] Clinician Interpretation: I have personally reviewed the CNS images as listed.  My interpretation, in the context of the patient's clinical presentation, is stable disease   MR Brain W Wo Contrast Result Date: 10/07/2023 CLINICAL DATA:  Brain metastases, assess treatment response Headache, history of cancer severe headaches. EXAM: MRI HEAD WITHOUT AND WITH CONTRAST TECHNIQUE: Multiplanar, multiecho pulse sequences of the brain and surrounding structures were obtained without and with intravenous contrast. CONTRAST:  6mL GADAVIST  GADOBUTROL  1 MMOL/ML IV SOLN COMPARISON:  MRI head May 15, 25. FINDINGS: Brain: No substantial change in solitary intra-axial enhancing metastasis at the junction of the left temporal and occipital lobes, measuring 1.2 cm on series 18, image 58. No new enhancing intraparenchymal metastasis identified. Mildly progressive multifocal nodular thickening of the dura, most conspicuous along the bilateral middle cranial fossa (for example see series 18, image 60). Persistent dorsal clival involvement and milder bilateral more superior frontoparietal convexity dural thickening is similar. No evidence of acute infarct, acute hemorrhage, midline shift, or hydrocephalus. Vascular: Major arterial flow voids are maintained at the skull base. Skull and upper cervical spine: Multifocal and widespread osseous metastases, most pronounced in the left frontal calvarium, not substantially changed. Sinuses/Orbits: Clear sinuses.  No acute orbital findings. Other: Small left mastoid effusion. IMPRESSION: 1. No substantial change  in solitary intra-axial enhancing metastasis at the junction of the left temporal and occipital lobes. No new intra-axial lesion identified. 2. Widespread osseous metastases with mildly progressive patchy meningeal dural involvement, most conspicuous along the bilateral middle cranial fossa. 3. No new/interval acute abnormality. Electronically Signed   By: Stevenson Elbe M.D.   On: 10/07/2023 20:15     Assessment/Plan Metastasis to brain [facility])  Chemotherapy-induced neuropathy (HCC)  HAELYN FORGEY is clinically stable today from neuro-oncologic standpoint.  MRI brain demonstrates stable findings overall now s/p L temporal SRS.  No significant edema or inflammation.  Her double vision localizes to left abducens nerve.  Suspect nerve infarct diabetic sixth based on clinical history, risk factors, exam and CNS imaging.    Recommended the following: -Stop dexamethasone  -Begin ASA 81mg  daily for secondary prevention -Meet with PCP to re-evaluate pre-diabetes -Meet with ophthalmologist to eval for prism  lenses  She is agreeable with this plan.  Otherwise recommended continuing imaging surveillance only at this time.  We appreciate the opportunity to participate in the care of MATHA MASSE.   Will con't to follow with Dr. Randy Buttery for systemic therapy.  We ask that TACI STERLING return to clinic in 3 months following next brain MRI, or sooner as needed.  All questions were answered. The patient knows to call the clinic with any problems, questions or concerns. No  barriers to learning were detected.  The total time spent in the encounter was 40 minutes and more than 50% was on counseling and review of test results   Mamie Searles, MD Medical Director of Neuro-Oncology [facility] at [facility] 10/10/23 9:42 AM

## 2023-10-14 DIAGNOSIS — H532 Diplopia: Secondary | ICD-10-CM | POA: Diagnosis not present

## 2023-10-14 DIAGNOSIS — H2513 Age-related nuclear cataract, bilateral: Secondary | ICD-10-CM | POA: Diagnosis not present

## 2023-10-15 ENCOUNTER — Encounter: Payer: Self-pay | Admitting: Radiation Oncology

## 2023-10-15 ENCOUNTER — Ambulatory Visit
Admission: RE | Admit: 2023-10-15 | Discharge: 2023-10-15 | Disposition: A | Discharge status: Home / Self Care | Source: Ambulatory Visit | Attending: Radiation Oncology | Admitting: Radiation Oncology

## 2023-10-15 VITALS — BP 121/84 | HR 118 | Temp 97.0°F | Ht 59.0 in | Wt 127.2 lb

## 2023-10-15 DIAGNOSIS — C3490 Malignant neoplasm of unspecified part of unspecified bronchus or lung: Secondary | ICD-10-CM | POA: Diagnosis not present

## 2023-10-15 NOTE — Progress Notes (Signed)
 Radiation Oncology Follow up Note  Name: Victoria Foley   Date:   10/15/2023 MRN:  [id number] DOB: 03/07/1951    This 73 y.o. female presents to the clinic today for 1 month follow-up status post SRS to her left occipital brain metastasis in patient with known stage IV adenocarcinoma of the lung.  REFERRING PROVIDER: Hope Merle, MD  HPI: Patient is a 73 year old female previously treated with palliative radiation therapy for a calvarium lesion.  She is now 1 month out from [facility] to a left occipital solitary brain metastasis..  She is currently awaiting results for targeted mutation has had outside consultation at [facility].  She is recently developed diplopia and has been evaluated by Dr. 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  She is stopped dexamethasone  is started aspirin  81 mg daily.  She has been seen by ophthalmology withspecific pathology noted she may be referred to neuro-ophthalmologist.  She currently has been on gemcitabine  which is on hold at this point.  Recent MRI scan which I reviewed shows substantial shrinkage of her occipital lobe lesion.  She does have widespread osseous metastatic disease with mild progressive patchy meningeal dural involvement conspicuous along the bilateral middle cranial fossa.  COMPLICATIONS OF TREATMENT: none  FOLLOW UP COMPLIANCE: keeps appointments   PHYSICAL EXAM:  BP 121/84   Pulse (!) 118   Temp (!) 97 F (36.1 C) (Tympanic)   Ht 4' 11 (1.499 m)   Wt 127 lb 3.2 oz (57.7 kg)   BMI 25.69 kg/m  Well-developed well-nourished patient in NAD. HEENT reveals PERLA, EOMI, discs not visualized.  Oral cavity is clear. No oral mucosal lesions are identified. Neck is clear without evidence of cervical or supraclavicular adenopathy. Lungs are clear to A&P. Cardiac examination is essentially unremarkable with regular rate and rhythm without murmur rub or thrill. Abdomen is benign with no organomegaly or masses noted. Motor  sensory and DTR levels are equal and symmetric in the upper and lower extremities. Cranial nerves II through XII are grossly intact. Proprioception is intact. No peripheral adenopathy or edema is identified. No motor or sensory levels are noted. Crude visual fields are within normal range.  RADIOLOGY RESULTS: MRI scan reviewed compatible with above-stated findings  PLAN: Present time patient scheduled for repeat MRI scan in September I will see her at the end of September for follow-up.  She continues close follow-up care and input from medical oncology as well as Dr. Buckley.  I have assured her her cranial nerves I no radiation therapy from [facility].  Patient and daughter know to call with any concerns.  I would like to take this opportunity to thank you for allowing me to participate in the care of your patient.SABRA Marcey Penton, MD

## 2023-10-17 ENCOUNTER — Encounter: Payer: Self-pay | Admitting: Oncology

## 2023-10-17 ENCOUNTER — Ambulatory Visit

## 2023-10-17 ENCOUNTER — Other Ambulatory Visit

## 2023-10-17 ENCOUNTER — Telehealth: Payer: Self-pay | Admitting: *Deleted

## 2023-10-17 NOTE — Telephone Encounter (Addendum)
 Patient's family is requesting an apt today for labs  (including cbc/metc,hgba1c) and iv fluids. Patient has continued to experience nausea and decrease appetite. Daughter accepted apt today at 8 today.  Daughter Tammy called back and msg back. She is unable to bring her mom and mom doesn't have anyone else that can bring her. She will cnl for today and then call us  back early next week if symptoms worse.  Daughter stated that the nausea is worse in the am. I asked Tammy if patient is taking the zyprexa  at bedtime to help at nausea. Tammy said most likely pt is not taking the Zyprexa  and she will remind pt to do so.

## 2023-10-21 DIAGNOSIS — H532 Diplopia: Secondary | ICD-10-CM | POA: Diagnosis not present

## 2023-10-21 DIAGNOSIS — H4923 Sixth [abducent] nerve palsy, bilateral: Secondary | ICD-10-CM | POA: Diagnosis not present

## 2023-10-22 ENCOUNTER — Telehealth: Payer: Self-pay

## 2023-10-22 NOTE — Telephone Encounter (Signed)
 [facility] testing sent earlier this month still in process.  Outbound call; spoke to [facility] Who indicated the 2nd tube of blood was pulled for additional quality checks and that is why it's slightly delayed. She will submit it to be expedited once the plasma processing is completed for that tube (it cannot be expedited through plasma processing). Once the sample hits the reporting stage then it will be expedited. Will keep note open to follow up until resulted.

## 2023-10-23 ENCOUNTER — Other Ambulatory Visit: Payer: Self-pay | Admitting: *Deleted

## 2023-10-23 ENCOUNTER — Inpatient Hospital Stay: Attending: Internal Medicine | Admitting: Oncology

## 2023-10-23 ENCOUNTER — Encounter: Payer: Self-pay | Admitting: Oncology

## 2023-10-23 ENCOUNTER — Ambulatory Visit

## 2023-10-23 ENCOUNTER — Inpatient Hospital Stay

## 2023-10-23 ENCOUNTER — Inpatient Hospital Stay: Admitting: Oncology

## 2023-10-23 VITALS — BP 140/68 | HR 82 | Temp 96.6°F | Resp 18 | Ht 59.0 in | Wt 127.2 lb

## 2023-10-23 DIAGNOSIS — R600 Localized edema: Secondary | ICD-10-CM | POA: Diagnosis not present

## 2023-10-23 DIAGNOSIS — Z79899 Other long term (current) drug therapy: Secondary | ICD-10-CM | POA: Diagnosis not present

## 2023-10-23 DIAGNOSIS — C3490 Malignant neoplasm of unspecified part of unspecified bronchus or lung: Secondary | ICD-10-CM

## 2023-10-23 DIAGNOSIS — Z803 Family history of malignant neoplasm of breast: Secondary | ICD-10-CM | POA: Diagnosis not present

## 2023-10-23 DIAGNOSIS — C3412 Malignant neoplasm of upper lobe, left bronchus or lung: Secondary | ICD-10-CM | POA: Insufficient documentation

## 2023-10-23 DIAGNOSIS — Z7982 Long term (current) use of aspirin: Secondary | ICD-10-CM | POA: Insufficient documentation

## 2023-10-23 DIAGNOSIS — I129 Hypertensive chronic kidney disease with stage 1 through stage 4 chronic kidney disease, or unspecified chronic kidney disease: Secondary | ICD-10-CM | POA: Diagnosis not present

## 2023-10-23 DIAGNOSIS — N189 Chronic kidney disease, unspecified: Secondary | ICD-10-CM | POA: Insufficient documentation

## 2023-10-23 DIAGNOSIS — D696 Thrombocytopenia, unspecified: Secondary | ICD-10-CM | POA: Insufficient documentation

## 2023-10-23 DIAGNOSIS — I700 Atherosclerosis of aorta: Secondary | ICD-10-CM | POA: Diagnosis not present

## 2023-10-23 DIAGNOSIS — Z9221 Personal history of antineoplastic chemotherapy: Secondary | ICD-10-CM | POA: Insufficient documentation

## 2023-10-23 DIAGNOSIS — Z923 Personal history of irradiation: Secondary | ICD-10-CM | POA: Diagnosis not present

## 2023-10-23 DIAGNOSIS — T451X5A Adverse effect of antineoplastic and immunosuppressive drugs, initial encounter: Secondary | ICD-10-CM | POA: Insufficient documentation

## 2023-10-23 DIAGNOSIS — E860 Dehydration: Secondary | ICD-10-CM

## 2023-10-23 DIAGNOSIS — C7931 Secondary malignant neoplasm of brain: Secondary | ICD-10-CM | POA: Diagnosis not present

## 2023-10-23 DIAGNOSIS — H532 Diplopia: Secondary | ICD-10-CM | POA: Insufficient documentation

## 2023-10-23 DIAGNOSIS — C7951 Secondary malignant neoplasm of bone: Secondary | ICD-10-CM | POA: Insufficient documentation

## 2023-10-23 DIAGNOSIS — Z8701 Personal history of pneumonia (recurrent): Secondary | ICD-10-CM | POA: Diagnosis not present

## 2023-10-23 DIAGNOSIS — Z7189 Other specified counseling: Secondary | ICD-10-CM | POA: Diagnosis not present

## 2023-10-23 DIAGNOSIS — D6481 Anemia due to antineoplastic chemotherapy: Secondary | ICD-10-CM | POA: Insufficient documentation

## 2023-10-23 DIAGNOSIS — Z8616 Personal history of COVID-19: Secondary | ICD-10-CM | POA: Insufficient documentation

## 2023-10-23 DIAGNOSIS — Z95828 Presence of other vascular implants and grafts: Secondary | ICD-10-CM

## 2023-10-23 DIAGNOSIS — K8020 Calculus of gallbladder without cholecystitis without obstruction: Secondary | ICD-10-CM | POA: Diagnosis not present

## 2023-10-23 LAB — CBC WITH DIFFERENTIAL (CANCER CENTER ONLY)
Abs Immature Granulocytes: 0.02 10*3/uL (ref 0.00–0.07)
Basophils Absolute: 0 10*3/uL (ref 0.0–0.1)
Basophils Relative: 0 %
Eosinophils Absolute: 0 10*3/uL (ref 0.0–0.5)
Eosinophils Relative: 1 %
HCT: 27.6 % — ABNORMAL LOW (ref 36.0–46.0)
Hemoglobin: 9.3 g/dL — ABNORMAL LOW (ref 12.0–15.0)
Immature Granulocytes: 0 %
Lymphocytes Relative: 9 %
Lymphs Abs: 0.4 10*3/uL — ABNORMAL LOW (ref 0.7–4.0)
MCH: 33.6 pg (ref 26.0–34.0)
MCHC: 33.7 g/dL (ref 30.0–36.0)
MCV: 99.6 fL (ref 80.0–100.0)
Monocytes Absolute: 0.4 10*3/uL (ref 0.1–1.0)
Monocytes Relative: 8 %
Neutro Abs: 3.9 10*3/uL (ref 1.7–7.7)
Neutrophils Relative %: 82 %
Platelet Count: 73 10*3/uL — ABNORMAL LOW (ref 150–400)
RBC: 2.77 MIL/uL — ABNORMAL LOW (ref 3.87–5.11)
RDW: 12.7 % (ref 11.5–15.5)
WBC Count: 4.8 10*3/uL (ref 4.0–10.5)
nRBC: 0 % (ref 0.0–0.2)

## 2023-10-23 LAB — CMP (CANCER CENTER ONLY)
ALT: 14 U/L (ref 0–44)
AST: 22 U/L (ref 15–41)
Albumin: 3.4 g/dL — ABNORMAL LOW (ref 3.5–5.0)
Alkaline Phosphatase: 318 U/L — ABNORMAL HIGH (ref 38–126)
Anion gap: 6 (ref 5–15)
BUN: 16 mg/dL (ref 8–23)
CO2: 20 mmol/L — ABNORMAL LOW (ref 22–32)
Calcium: 8 mg/dL — ABNORMAL LOW (ref 8.9–10.3)
Chloride: 100 mmol/L (ref 98–111)
Creatinine: 1.01 mg/dL — ABNORMAL HIGH (ref 0.44–1.00)
GFR, Estimated: 60 mL/min — ABNORMAL LOW (ref >60)
Glucose, Bld: 142 mg/dL — ABNORMAL HIGH (ref 70–99)
Potassium: 3.9 mmol/L (ref 3.5–5.1)
Sodium: 126 mmol/L — ABNORMAL LOW (ref 135–145)
Total Bilirubin: 0.8 mg/dL (ref 0.0–1.2)
Total Protein: 6.6 g/dL (ref 6.5–8.1)

## 2023-10-23 MED ORDER — HEPARIN SOD (PORK) LOCK FLUSH 100 UNIT/ML IV SOLN
500.0000 [IU] | Freq: Once | INTRAVENOUS | Status: AC
  Administered 2023-10-23: 500 [IU]
  Filled 2023-10-23: qty 5

## 2023-10-23 MED ORDER — SODIUM CHLORIDE 0.9% FLUSH
10.0000 mL | Freq: Once | INTRAVENOUS | Status: AC
  Administered 2023-10-23: 10 mL via INTRAVENOUS
  Filled 2023-10-23: qty 10

## 2023-10-23 MED ORDER — SODIUM CHLORIDE 0.9 % IV SOLN
INTRAVENOUS | Status: DC
  Filled 2023-10-23 (×2): qty 250

## 2023-10-23 NOTE — Telephone Encounter (Signed)
 MyChart msg not read so I contacted patient's daughter via phone.  They are concerned with increasing weakness with decreasing appetite.  Also having nausea that is relived by taking Zyprexa  but doesn't take consistently.  During my conversation with daughter she says that some from [facility] has already contacted patient to schedule appts for today for lab/MD/IVF.

## 2023-10-25 ENCOUNTER — Encounter: Payer: Self-pay | Admitting: Internal Medicine

## 2023-10-25 NOTE — Progress Notes (Signed)
 Hematology/Oncology Consult note [facility]  [phone] [phone]  Patient Care Team: Hope Merle, MD as PCP - General (Family Medicine) Verdene Gills, RN as Oncology Nurse Navigator Francisca Redell BROCKS, MD as Consulting Physician (Urology) Melanee Annah BROCKS, MD as Consulting Physician (Oncology)   Name of the patient: Victoria Foley  [id number]  [birth year]   Date of visit: 10/25/23  Diagnosis- stage IV adenocarcinoma of the lung   Chief complaint/ Reason for visit- discuss further management of lung cancer   Heme/Onc history:  Oncology History  Primary lung adenocarcinoma (HCC)  10/10/2021 Initial Diagnosis   Primary lung adenocarcinoma (HCC) Stage IV  Patient seen by PCP for wheezing for 2 weeks. Imaging with CXR followed by CT chest showed Spiculated 2.1 x 2.2 cm left upper lobe pulmonary nodule with associated pleural tethering. Innumerable subcentimeter pulmonary nodules throughout the lungs.      11/14/2021 PET scan   IMPRESSION: 1. Left suprahilar nodule with maximum SUV 4.5, and a more distal 2.2 cm left upper lobe nodule with maximum SUV of 5.5. 2. Innumerable scattered small pulmonary nodules, most of which are under 5 mm in diameter, throughout both lungs. Some are cavitary. Possibilities include cavitating hematogenous disseminated malignancy versus infectious etiology subset as septic emboli. Most of the nodules are stable, some are minimally enlarged compared to 10/10/2021. 3. Abnormal mild permeative type bony findings in the right hemipelvis associated with substantially accentuated metabolic activity. Possible associated pathologic fracture through the right quadrilateral plate and right acetabulum. The findings involve the right ilium and ischium but also the right lateral sacral ala and a small focus in the left sacrum.    Pathology Results   She had transbronchial biopsies by Dr. Tamea on 11/19/21.   Pathology from LUL nodule showed adenocarcinoma, consistent with lung primary.    11/19/2021 Cancer Staging   Staging form: Lung, AJCC 8th Edition - Clinical stage from 11/19/2021: Stage IVB (cT1c, cM1c) - Signed by Clista Bimler, MD on 11/27/2021 Stage prefix: Initial diagnosis   11/27/2021 Imaging   MRI pelvis  IMPRESSION: 1. Abnormal marrow signal throughout the right ilium involving the right acetabulum, right superior pubic ramus and right inferior pubic ramus, right side of the sacrum and a smaller focus of abnormal marrow lesion in the left side of the sacrum. Findings are concerning for metastatic disease. 2. Nondisplaced pathologic fracture of the quadrilateral plate of the right acetabulum. 3. Mild muscle edema in the right gluteus minimus and medius muscles likely reflecting mild muscle strain.  MRI brain  IMPRESSION: No evidence of intracranial metastatic disease.   Indeterminate 1 cm left frontal calvarium lesion.   12/18/2021 - 08/27/2022 Chemotherapy   Patient is on Treatment Plan : LUNG Carboplatin  (5) + Pemetrexed  (500) + Pembrolizumab  (200) D1 q21d Induction x 4 cycles / Maintenance Pemetrexed  (500) + Pembrolizumab  (200) D1 q21d     10/07/2022 - 01/21/2023 Chemotherapy   Patient is on Treatment Plan : LUNG Carboplatin  + Paclitaxel  q21d Dose Reduction     03/27/2023 - 04/24/2023 Chemotherapy   Patient is on Treatment Plan : LUNG Bevacizumab  q21d     06/26/2023 - 08/21/2023 Chemotherapy   Patient is on Treatment Plan : LUNG Gemcitabine  (1000) D1,8 q21d     Primary malignant neoplasm of left upper lobe of lung (HCC)  06/04/2022 Initial Diagnosis   Primary malignant neoplasm of left upper lobe of lung (HCC)   06/04/2022 Cancer Staging   Staging form: Lung, AJCC 8th Edition -  Pathologic: Stage IVB (pT1c, pNX, cM1c) - Signed by Rennie Cindy SAUNDERS, MD on 06/04/2022       Interval history- she feels poorly. Appetite is poor. She has no energy. On and off headaches  ECOG  PS- 3 Pain scale- 3 Opioid associated constipation- no  Review of systems- Review of Systems  Constitutional:  Positive for malaise/fatigue and weight loss. Negative for chills and fever.       Loss of appetite  HENT:  Negative for congestion, ear discharge and nosebleeds.   Eyes:  Negative for blurred vision.  Respiratory:  Negative for cough, hemoptysis, sputum production, shortness of breath and wheezing.   Cardiovascular:  Negative for chest pain, palpitations, orthopnea and claudication.  Gastrointestinal:  Negative for abdominal pain, blood in stool, constipation, diarrhea, heartburn, melena, nausea and vomiting.  Genitourinary:  Negative for dysuria, flank pain, frequency, hematuria and urgency.  Musculoskeletal:  Negative for back pain, joint pain and myalgias.  Skin:  Negative for rash.  Neurological:  Negative for dizziness, tingling, focal weakness, seizures, weakness and headaches.  Endo/Heme/Allergies:  Does not bruise/bleed easily.  Psychiatric/Behavioral:  Negative for depression and suicidal ideas. The patient does not have insomnia.       Allergies  Allergen Reactions   Diclofenac Hives   Lisinopril     cough   Sulfa  Antibiotics     ? 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   COVID-19    03/2021   Hypertension    Pneumonia    age 74   Pre-diabetes    Prediabetes    Sciatica      Past Surgical History:  Procedure Laterality Date   BRONCHIAL BIOPSY  12/17/2022   Procedure: BRONCHIAL BIOPSIES;  Surgeon: Brenna Adine CROME, DO;  Location: [facility];  Service: Pulmonary;;   BRONCHIAL NEEDLE ASPIRATION BIOPSY  12/17/2022   Procedure: BRONCHIAL NEEDLE ASPIRATION BIOPSIES;  Surgeon: Brenna Adine CROME, DO;  Location: [facility];  Service: Pulmonary;;   CESAREAN  SECTION     COLONOSCOPY W/ POLYPECTOMY     ECTOPIC PREGNANCY SURGERY     FEMUR SURGERY     left, s/p rod for fracture   IR IMAGING GUIDED PORT INSERTION  12/11/2021   TUBAL LIGATION      Social History   Socioeconomic History   Marital status: Widowed    Spouse name: Not on file   Number of children: Not on file   Years of education: Not on file   Highest education level: Some college, no degree  Occupational History   Not on file  Tobacco Use   Smoking status: Never    Passive exposure: Never   Smokeless tobacco: Never  Vaping Use   Vaping status: Never Used  Substance and Sexual Activity   Alcohol use: Never   Drug use: Never   Sexual activity: Not Currently  Other Topics Concern   Not on file  Social History Narrative   Lives in [place] but husband died 2020-06-12 motorcycle accident bday 10/19/20 married 47 years   No pets   2 daughters and 3 granddaughters      Work - [employer] retired 09/2019   Diet - regular diet   Exercise - none  Social Drivers of Corporate investment banker Strain: Low Risk  (06/18/2023)   Received from [facility]   Overall Financial Resource Strain (CARDIA)    Difficulty of Paying Living Expenses: Not hard at all  Food Insecurity: No Food Insecurity (06/18/2023)   Received from [facility]   Hunger Vital Sign    Within the past 12 months, you worried that your food would run out before you got the money to buy more.: Never true    Within the past 12 months, the food you bought just didn't last and you didn't have money to get more.: Never true  Transportation Needs: No Transportation Needs (06/18/2023)   Received from [facility] - Transportation    In the past 12 months, has lack of transportation kept you from medical appointments or from getting medications?: No    Lack of Transportation (Non-Medical): No  Physical Activity: Inactive (05/27/2023)   Exercise Vital  Sign    Days of Exercise per Week: 0 days    Minutes of Exercise per Session: 0 min  Stress: No Stress Concern Present (05/27/2023)   [employer] of Occupational Health - Occupational Stress Questionnaire    Feeling of Stress : Not at all  Social Connections: Moderately Isolated (05/27/2023)   Social Connection and Isolation Panel    Frequency of Communication with Friends and Family: More than three times a week    Frequency of Social Gatherings with Friends and Family: Once a week    Attends Religious Services: 1 to 4 times per year    Active Member of [employer] or Organizations: No    Attends Banker Meetings: Never    Marital Status: Widowed  Intimate Partner Violence: Not At Risk (02/05/2023)   Humiliation, Afraid, Rape, and Kick questionnaire    Fear of Current or Ex-Partner: No    Emotionally Abused: No    Physically Abused: No    Sexually Abused: No    Family History  Problem Relation Age of Onset   Diabetes Mother    Hypertension Mother    Heart disease Mother    Cancer Mother        breast and stomach   Breast cancer Mother 69   Diabetes Father    Hypertension Father    Heart disease Father    Kidney disease Father      Current Outpatient Medications:    albuterol  (VENTOLIN  HFA) 108 (90 Base) MCG/ACT inhaler, Inhale 2 puffs into the lungs every 6 (six) hours as needed for wheezing or shortness of breath., Disp: 18 g, Rfl: 0   aspirin  EC 81 MG tablet, Take 1 tablet (81 mg total) by mouth daily. Swallow whole., Disp: 30 tablet, Rfl: 12   Azelastine  HCl 137 MCG/SPRAY SOLN, Place 2 sprays into both nostrils 2 (two) times daily., Disp: , Rfl:    calcium  carbonate (OSCAL) 1500 (600 Ca) MG TABS tablet, Take 600 mg of elemental calcium  by mouth 3 (three) times daily with meals., Disp: , Rfl:    cetirizine (ZYRTEC) 5 MG tablet, Take 5 mg by mouth daily., Disp: , Rfl:    Cholecalciferol (VITAMIN D -3 PO), Take by mouth., Disp: , Rfl:    dexamethasone  (DECADRON ) 2  MG tablet, Take 1 tablet (2 mg total) by mouth daily. (Patient not taking: Reported on 10/23/2023), Disp: 14 tablet, Rfl: 0   fluticasone  (FLONASE ) 50 MCG/ACT nasal spray, USE TWO SPRAYS IN EACH NOSTRIL ONCE DAILY  prn, Disp: 16 g, Rfl: 12   lactulose  (CHRONULAC ) 10 GM/15ML solution, Take 15 mLs (10 g total) by mouth 3 (three) times daily., Disp: 236 mL, Rfl: 0   lansoprazole  (PREVACID ) 15 MG capsule, Take 1 capsule (15 mg total) by mouth daily at 12 noon for 12 days. (Patient not taking: Reported on 10/10/2023), Disp: 12 capsule, Rfl: 0   lidocaine -prilocaine  (EMLA ) cream, Apply topically., Disp: , Rfl:    loratadine  (CLARITIN ) 10 MG tablet, TAKE ONE TABLET BY MOUTH DAILY AS NEEDED FOR ALLERGIES, Disp: 90 tablet, Rfl: 3   losartan  (COZAAR ) 100 MG tablet, Take 1 tablet (100 mg total) by mouth daily., Disp: 90 tablet, Rfl: 3   magic mouthwash w/lidocaine  SOLN, Take 5 mLs by mouth 4 (four) times daily as needed for mouth pain. Sig: Swish/Swallow 5-10 ml four times a day as needed. Dispense 480 ml. 1RF (Patient not taking: Reported on 10/10/2023), Disp: 480 mL, Rfl: 1   naloxone  (NARCAN ) nasal spray 4 mg/0.1 mL, SPRAY 1 SPRAY INTO ONE NOSTRIL AS DIRECTED FOR OPIOID OVERDOSE (TURN PERSON ON SIDE AFTER DOSE. IF NO RESPONSE IN 2-3 MINUTES OR PERSON RESPONDS BUT RELAPSES, REPEAT USING A NEW SPRAY DEVICE AND SPRAY INTO THE OTHER NOSTRIL. CALL 911 AFTER USE.) * EMERGENCY USE ONLY * (Patient not taking: Reported on 10/10/2023), Disp: 1 each, Rfl: 0   ondansetron  (ZOFRAN ) 8 MG tablet, Take 1 tablet (8 mg total) by mouth every 8 (eight) hours as needed for nausea or vomiting., Disp: 30 tablet, Rfl: 3   oxyCODONE  (OXY IR/ROXICODONE ) 5 MG immediate release tablet, Take 1-2 tablets (5-10 mg total) by mouth every 4 (four) hours as needed for severe pain (pain score 7-10)., Disp: 60 tablet, Rfl: 0   oxyCODONE  ER (XTAMPZA  ER) 9 MG C12A, Take 1 capsule by mouth every 12 (twelve) hours., Disp: 60 capsule, Rfl: 0   senna (SENOKOT)  8.6 MG TABS tablet, Take 1 tablet by mouth daily., Disp: , Rfl:  No current facility-administered medications for this visit.  Facility-Administered Medications Ordered in Other Visits:    heparin  lock flush 100 UNIT/ML injection, , , ,    heparin  lock flush 100 unit/mL, 500 Units, Intravenous, Once, Agrawal, Kavita, MD   sodium chloride  flush (NS) 0.9 % injection 10 mL, 10 mL, Intravenous, Once, Agrawal, Kavita, MD  Physical exam:  Vitals:   10/23/23 1411 10/23/23 1459 10/23/23 1502  BP:  (!) 140/68   Pulse:  82   Resp:  18   Temp:  (!) 96.6 F (35.9 C)   TempSrc:  Tympanic   SpO2:  100% (!) 2%  Weight:  127 lb 3.2 oz (57.7 kg)   Height: 4' 11 (1.499 m) 4' 11 (1.499 m)    Physical Exam Cardiovascular:     Rate and Rhythm: Normal rate and regular rhythm.     Heart sounds: Normal heart sounds.  Pulmonary:     Effort: Pulmonary effort is normal.     Breath sounds: Normal breath sounds.  Abdominal:     General: Bowel sounds are normal.     Palpations: Abdomen is soft.  Skin:    General: Skin is warm and dry.  Neurological:     Mental Status: She is alert and oriented to person, place, and time.      I have personally reviewed labs listed below:    Latest Ref Rng & Units 10/23/2023    2:44 PM  CMP  Glucose 70 - 99 mg/dL 857   BUN 8 -  23 mg/dL 16   Creatinine 9.55 - 1.00 mg/dL 8.98   Sodium 864 - 854 mmol/L 126   Potassium 3.5 - 5.1 mmol/L 3.9   Chloride 98 - 111 mmol/L 100   CO2 22 - 32 mmol/L 20   Calcium  8.9 - 10.3 mg/dL 8.0   Total Protein 6.5 - 8.1 g/dL 6.6   Total Bilirubin 0.0 - 1.2 mg/dL 0.8   Alkaline Phos 38 - 126 U/L 318   AST 15 - 41 U/L 22   ALT 0 - 44 U/L 14       Latest Ref Rng & Units 10/23/2023    2:44 PM  CBC  WBC 4.0 - 10.5 K/uL 4.8   Hemoglobin 12.0 - 15.0 g/dL 9.3   Hematocrit 63.9 - 46.0 % 27.6   Platelets 150 - 400 K/uL 73    I have personally reviewed Radiology images listed below: No images are attached to the encounter.  MR Brain  W Wo Contrast Result Date: 10/07/2023 CLINICAL DATA:  Brain metastases, assess treatment response Headache, history of cancer severe headaches. EXAM: MRI HEAD WITHOUT AND WITH CONTRAST TECHNIQUE: Multiplanar, multiecho pulse sequences of the brain and surrounding structures were obtained without and with intravenous contrast. CONTRAST:  6mL GADAVIST  GADOBUTROL  1 MMOL/ML IV SOLN COMPARISON:  MRI head May 15, 25. FINDINGS: Brain: No substantial change in solitary intra-axial enhancing metastasis at the junction of the left temporal and occipital lobes, measuring 1.2 cm on series 18, image 58. No new enhancing intraparenchymal metastasis identified. Mildly progressive multifocal nodular thickening of the dura, most conspicuous along the bilateral middle cranial fossa (for example see series 18, image 60). Persistent dorsal clival involvement and milder bilateral more superior frontoparietal convexity dural thickening is similar. No evidence of acute infarct, acute hemorrhage, midline shift, or hydrocephalus. Vascular: Major arterial flow voids are maintained at the skull base. Skull and upper cervical spine: Multifocal and widespread osseous metastases, most pronounced in the left frontal calvarium, not substantially changed. Sinuses/Orbits: Clear sinuses.  No acute orbital findings. Other: Small left mastoid effusion. IMPRESSION: 1. No substantial change in solitary intra-axial enhancing metastasis at the junction of the left temporal and occipital lobes. No new intra-axial lesion identified. 2. Widespread osseous metastases with mildly progressive patchy meningeal dural involvement, most conspicuous along the bilateral middle cranial fossa. 3. No new/interval acute abnormality. Electronically Signed   By: Gilmore GORMAN Molt M.D.   On: 10/07/2023 20:15     Assessment and plan- Patient is a 73 y.o. female with history of metastatic adenocarcinoma of the lung here to discuss further management  We are currently  awaiting repeat NGS to see if there are any actionable mutations.  Patient has previously progressed on carbo Alimta  Keytruda  chemotherapy as well as gemcitabine .  She has also been treated with CarboTaxol bevacizumab  in the past.  Treatment complicated by persistent cytopenias requiring dose reductions and dose delays.  If patient does not have any actionable mutations on NGS testing options include retrying Alimta  since that was discontinued prematurely due to anemia versus dose reduced docetaxel but all these regimens are going to be challenging given patient's performance status as well as thrombocytopenia.  Patient does not wish to pursue hospice.  We are getting repeat scans next week and following that patient will be on vacation for a week.  She will see Dr. Rennie in my absence during the week of 11/10/2023 and decide about further course of management.  Patient has not received any chemotherapy since May 2025  and despite that her thrombocytopenia has not improved.  Question is if the thrombocytopenia is secondary to chronic myelosuppression from chemotherapy versus possible bone marrow involvement with malignancy.  I do not think getting a bone marrow biopsy at this time will change management.  She is receiving 1 L of IV fluids today   Visit Diagnosis 1. Anemia due to antineoplastic chemotherapy   2. Primary adenocarcinoma of lung, unspecified laterality (HCC)      Dr. Annah Skene, MD, MPH [facility] [phone] 10/25/2023 5:54 PM

## 2023-10-27 ENCOUNTER — Encounter: Payer: Self-pay | Admitting: Oncology

## 2023-10-28 ENCOUNTER — Encounter: Payer: Self-pay | Admitting: *Deleted

## 2023-10-28 ENCOUNTER — Ambulatory Visit

## 2023-10-28 ENCOUNTER — Ambulatory Visit: Payer: Self-pay | Admitting: Oncology

## 2023-10-28 DIAGNOSIS — C3490 Malignant neoplasm of unspecified part of unspecified bronchus or lung: Secondary | ICD-10-CM

## 2023-10-30 ENCOUNTER — Ambulatory Visit
Admission: RE | Admit: 2023-10-30 | Discharge: 2023-10-30 | Disposition: A | Discharge status: Home / Self Care | Source: Ambulatory Visit | Attending: Internal Medicine | Admitting: Internal Medicine

## 2023-10-30 DIAGNOSIS — C3490 Malignant neoplasm of unspecified part of unspecified bronchus or lung: Secondary | ICD-10-CM | POA: Diagnosis not present

## 2023-10-30 DIAGNOSIS — C7951 Secondary malignant neoplasm of bone: Secondary | ICD-10-CM | POA: Diagnosis not present

## 2023-10-30 DIAGNOSIS — K5900 Constipation, unspecified: Secondary | ICD-10-CM | POA: Diagnosis not present

## 2023-10-30 DIAGNOSIS — I700 Atherosclerosis of aorta: Secondary | ICD-10-CM | POA: Diagnosis not present

## 2023-11-04 ENCOUNTER — Encounter: Payer: Self-pay | Admitting: Internal Medicine

## 2023-11-04 NOTE — Telephone Encounter (Signed)
 Appears Guardant testing resulted but I do not have access to the results.  No further f/u needed.

## 2023-11-10 ENCOUNTER — Inpatient Hospital Stay (HOSPITAL_BASED_OUTPATIENT_CLINIC_OR_DEPARTMENT_OTHER): Admitting: Internal Medicine

## 2023-11-10 ENCOUNTER — Encounter: Payer: Self-pay | Admitting: Internal Medicine

## 2023-11-10 ENCOUNTER — Inpatient Hospital Stay

## 2023-11-10 VITALS — BP 118/74 | HR 114 | Temp 97.7°F | Resp 18 | Ht 59.0 in | Wt 125.8 lb

## 2023-11-10 DIAGNOSIS — C3490 Malignant neoplasm of unspecified part of unspecified bronchus or lung: Secondary | ICD-10-CM

## 2023-11-10 DIAGNOSIS — C7931 Secondary malignant neoplasm of brain: Secondary | ICD-10-CM

## 2023-11-10 DIAGNOSIS — C3412 Malignant neoplasm of upper lobe, left bronchus or lung: Secondary | ICD-10-CM | POA: Diagnosis not present

## 2023-11-10 LAB — CMP (CANCER CENTER ONLY)
ALT: 14 U/L (ref 0–44)
AST: 22 U/L (ref 15–41)
Albumin: 3.5 g/dL (ref 3.5–5.0)
Alkaline Phosphatase: 327 U/L — ABNORMAL HIGH (ref 38–126)
Anion gap: 8 (ref 5–15)
BUN: 22 mg/dL (ref 8–23)
CO2: 18 mmol/L — ABNORMAL LOW (ref 22–32)
Calcium: 8.5 mg/dL — ABNORMAL LOW (ref 8.9–10.3)
Chloride: 105 mmol/L (ref 98–111)
Creatinine: 1.13 mg/dL — ABNORMAL HIGH (ref 0.44–1.00)
GFR, Estimated: 52 mL/min — ABNORMAL LOW (ref >60)
Glucose, Bld: 115 mg/dL — ABNORMAL HIGH (ref 70–99)
Potassium: 3.9 mmol/L (ref 3.5–5.1)
Sodium: 131 mmol/L — ABNORMAL LOW (ref 135–145)
Total Bilirubin: 0.5 mg/dL (ref 0.0–1.2)
Total Protein: 6.5 g/dL (ref 6.5–8.1)

## 2023-11-10 LAB — CBC WITH DIFFERENTIAL (CANCER CENTER ONLY)
Abs Immature Granulocytes: 0.01 K/uL (ref 0.00–0.07)
Basophils Absolute: 0 K/uL (ref 0.0–0.1)
Basophils Relative: 1 %
Eosinophils Absolute: 0 K/uL (ref 0.0–0.5)
Eosinophils Relative: 1 %
HCT: 27.5 % — ABNORMAL LOW (ref 36.0–46.0)
Hemoglobin: 9.1 g/dL — ABNORMAL LOW (ref 12.0–15.0)
Immature Granulocytes: 0 %
Lymphocytes Relative: 19 %
Lymphs Abs: 0.6 K/uL — ABNORMAL LOW (ref 0.7–4.0)
MCH: 33.3 pg (ref 26.0–34.0)
MCHC: 33.1 g/dL (ref 30.0–36.0)
MCV: 100.7 fL — ABNORMAL HIGH (ref 80.0–100.0)
Monocytes Absolute: 0.3 K/uL (ref 0.1–1.0)
Monocytes Relative: 10 %
Neutro Abs: 2 K/uL (ref 1.7–7.7)
Neutrophils Relative %: 69 %
Platelet Count: 85 K/uL — ABNORMAL LOW (ref 150–400)
RBC: 2.73 MIL/uL — ABNORMAL LOW (ref 3.87–5.11)
RDW: 13 % (ref 11.5–15.5)
WBC Count: 2.9 K/uL — ABNORMAL LOW (ref 4.0–10.5)
nRBC: 0 % (ref 0.0–0.2)

## 2023-11-10 MED ORDER — HEPARIN SOD (PORK) LOCK FLUSH 100 UNIT/ML IV SOLN
500.0000 [IU] | Freq: Once | INTRAVENOUS | Status: AC
  Administered 2023-11-10: 500 [IU] via INTRAVENOUS
  Filled 2023-11-10: qty 5

## 2023-11-10 MED ORDER — HYDROCOD POLI-CHLORPHE POLI ER 10-8 MG/5ML PO SUER: 5.0000 mL | Freq: Every evening | ORAL | 0 refills | Status: DC | PRN

## 2023-11-10 NOTE — Assessment & Plan Note (Addendum)
#   Primary lung adenocarcinoma Stage IV, PDL1 1% [11/19/2021 after transbronchial biopsy of LUL nodule by Dr. Gonzalez].  Metastatic to pelvis, bilateral hips R>L and numerous pulmonary nodules. MRI brain showed no intracranial mets. 1 cm indeterminate left frontal calvarium lesion.  Liquid biopsy JUNE 2025- - NEG for any targetable mutations.  # JULY 2025-progressive metastatic disease in the lungs-patient mildly symptomatic with worsening cough.  # Patient currently status post multiple lines of therapy  carbo Alimta  Keytruda ; gemcitabine . CarboTaxol bevacizumab  in the past.  Unfortunately further treatments treatment complicated by persistent cytopenias requiring dose reductions and dose delays.  Today-CBC still not recovered back to baseline, last chemotherapy May 2025.    # I had a long discussion with the patient and family given the incurable nature of the disease /poor tolerance of therapy. I introduced hospice philosophy to the patient and family.  Discussed that goal of care should be directed to symptom management rather than treating the underlying disease; and in the process help improve quality of life rather than quantity.  Patient/family reluctantly agreed to have meeting with hospice.  Hospice referral made.  Given the progressive cough recommend Tussionex and prescription sent.  # Brain metastases-with cranial palsy s/p radiation-continues to have numbness on the left side of the face likely from the underlying malignancy.  Status post radiation-May 2025.  Currently awaiting reevaluation with [facility] neuro-ophthalmology.  Per patient/family wishes-will order MRI brain prior to the visit and end of October.  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  Currently no pain.   # Access - port  # DISPOSITION: # de-access # recommend hospice  re: lung cancer #  PLEASE SCHEDULE MRI brain- in third week of AUG # FOLLOW UP WITH Dr.Rao-  in thrid week of AUG after Brain MRI- labs- port- cbc/cmp;- Dr.B  # I reviewed the blood work- with the patient in detail; also reviewed the imaging independently [as summarized above]; and with the patient in detail.    # 40 minutes face-to-face with the patient discussing the above plan of care; more than 50% of time spent on prognosis/ natural history; counseling and coordination.

## 2023-11-10 NOTE — Progress Notes (Signed)
 [facility] CONSULT NOTE  Patient Care Team: Hope Merle, MD (Inactive) as PCP - General (Family Medicine) Verdene Gills, RN as Oncology Nurse Navigator Francisca Redell BROCKS, MD as Consulting Physician (Urology) Melanee Annah BROCKS, MD as Consulting Physician (Oncology)  CHIEF COMPLAINTS/PURPOSE OF CONSULTATION: Lung cancer  Oncology History  Primary lung adenocarcinoma (HCC)  10/10/2021 Initial Diagnosis   Primary lung adenocarcinoma (HCC) Stage IV  Patient seen by PCP for wheezing for 2 weeks. Imaging with CXR followed by CT chest showed Spiculated 2.1 x 2.2 cm left upper lobe pulmonary nodule with associated pleural tethering. Innumerable subcentimeter pulmonary nodules throughout the lungs.      11/14/2021 PET scan   IMPRESSION: 1. Left suprahilar nodule with maximum SUV 4.5, and a more distal 2.2 cm left upper lobe nodule with maximum SUV of 5.5. 2. Innumerable scattered small pulmonary nodules, most of which are under 5 mm in diameter, throughout both lungs. Some are cavitary. Possibilities include cavitating hematogenous disseminated malignancy versus infectious etiology subset as septic emboli. Most of the nodules are stable, some are minimally enlarged compared to 10/10/2021. 3. Abnormal mild permeative type bony findings in the right hemipelvis associated with substantially accentuated metabolic activity. Possible associated pathologic fracture through the right quadrilateral plate and right acetabulum. The findings involve the right ilium and ischium but also the right lateral sacral ala and a small focus in the left sacrum.    Pathology Results   She had transbronchial biopsies by Dr. Tamea on 11/19/21.  Pathology from LUL nodule showed adenocarcinoma, consistent with lung primary.    11/19/2021 Cancer Staging   Staging form: Lung, AJCC 8th Edition - Clinical stage from 11/19/2021: Stage IVB (cT1c, cM1c) - Signed by Clista Bimler, MD on 11/27/2021 Stage  prefix: Initial diagnosis   11/27/2021 Imaging   MRI pelvis  IMPRESSION: 1. Abnormal marrow signal throughout the right ilium involving the right acetabulum, right superior pubic ramus and right inferior pubic ramus, right side of the sacrum and a smaller focus of abnormal marrow lesion in the left side of the sacrum. Findings are concerning for metastatic disease. 2. Nondisplaced pathologic fracture of the quadrilateral plate of the right acetabulum. 3. Mild muscle edema in the right gluteus minimus and medius muscles likely reflecting mild muscle strain.  MRI brain  IMPRESSION: No evidence of intracranial metastatic disease.   Indeterminate 1 cm left frontal calvarium lesion.   12/18/2021 - 08/27/2022 Chemotherapy   Patient is on Treatment Plan : LUNG Carboplatin  (5) + Pemetrexed  (500) + Pembrolizumab  (200) D1 q21d Induction x 4 cycles / Maintenance Pemetrexed  (500) + Pembrolizumab  (200) D1 q21d     10/07/2022 - 01/21/2023 Chemotherapy   Patient is on Treatment Plan : LUNG Carboplatin  + Paclitaxel  q21d Dose Reduction     03/27/2023 - 04/24/2023 Chemotherapy   Patient is on Treatment Plan : LUNG Bevacizumab  q21d     06/26/2023 - 08/21/2023 Chemotherapy   Patient is on Treatment Plan : LUNG Gemcitabine  (1000) D1,8 q21d     Primary malignant neoplasm of left upper lobe of lung (HCC)  06/04/2022 Initial Diagnosis   Primary malignant neoplasm of left upper lobe of lung (HCC)   06/04/2022 Cancer Staging   Staging form: Lung, AJCC 8th Edition - Pathologic: Stage IVB (pT1c, pNX, cM1c) - Signed by Daunte Oestreich R, MD on 06/04/2022     HISTORY OF PRESENTING ILLNESS: Patient ambulating-in with assistance.   Accompanied by family- 2 daughters.   Chitara Clonch Roads 73 y.o.  female pleasant  patient with stage IV adenocarcinoma of the lung status post multiple lines of therapy and also history of brain metastases-currently off therapy because of poor bone marrow results is here for follow-up/review  results of the CT scan.  Patient had a CT scan a couple of weeks ago and has no new or acute concerns at this time except for ongoing cough.  Progressively getting worse.  No significant phlegm or blood.  Patient continues to have intermittent double vision given history of left external cranial palsy.  She is currently status post radiation.  Last imaging of the brain in June showed stable disease.  Review of Systems  Constitutional:  Negative for chills, diaphoresis, fever, malaise/fatigue and weight loss.  HENT:  Negative for nosebleeds and sore throat.   Eyes:  Negative for double vision.  Respiratory:  Negative for cough, hemoptysis, sputum production, shortness of breath and wheezing.   Cardiovascular:  Negative for chest pain, palpitations, orthopnea and leg swelling.  Gastrointestinal:  Negative for abdominal pain, blood in stool, constipation, diarrhea, heartburn, melena, nausea and vomiting.  Genitourinary:  Negative for dysuria, frequency and urgency.  Musculoskeletal:  Negative for back pain and joint pain.  Skin: Negative.  Negative for itching and rash.  Neurological:  Negative for dizziness, tingling, focal weakness, weakness and headaches.  Endo/Heme/Allergies:  Does not bruise/bleed easily.  Psychiatric/Behavioral:  Negative for depression. The patient is not nervous/anxious and does not have insomnia.     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   COVID-19    03/2021   Hypertension    Pneumonia    age 73   Pre-diabetes    Prediabetes    Sciatica     SURGICAL HISTORY: Past Surgical History:  Procedure Laterality Date   BRONCHIAL BIOPSY  12/17/2022   Procedure: BRONCHIAL BIOPSIES;  Surgeon: Brenna Adine CROME, DO;  Location: [facility];  Service: Pulmonary;;    BRONCHIAL NEEDLE ASPIRATION BIOPSY  12/17/2022   Procedure: BRONCHIAL NEEDLE ASPIRATION BIOPSIES;  Surgeon: Brenna Adine CROME, DO;  Location: [facility];  Service: Pulmonary;;   CESAREAN SECTION     COLONOSCOPY W/ POLYPECTOMY     ECTOPIC PREGNANCY SURGERY     FEMUR SURGERY     left, s/p rod for fracture   IR IMAGING GUIDED PORT INSERTION  12/11/2021   TUBAL LIGATION      SOCIAL HISTORY: Social History   Socioeconomic History   Marital status: Widowed    Spouse name: Not on file   Number of children: Not on file   Years of education: Not on file   Highest education level: Some college, no degree  Occupational History   Not on file  Tobacco Use   Smoking status: Never    Passive exposure: Never   Smokeless tobacco: Never  Vaping Use   Vaping status: Never Used  Substance and Sexual Activity   Alcohol use: Never   Drug use: Never   Sexual activity: Not Currently  Other Topics Concern   Not on file  Social History Narrative   Lives in [place] but husband died 16-Jul-2020 motorcycle accident bday 10/19/20 married 47 years   No pets   2 daughters and 3 granddaughters      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   Diet - regular diet   Exercise - none   Social Drivers of Health   Financial Resource Strain: Low Risk  (06/18/2023)   [facility]   Overall Financial Resource Strain (CARDIA)    Difficulty of Paying Living Expenses: Not hard at all  Food Insecurity: No Food Insecurity (06/18/2023)   Received from [facility]   Hunger Vital Sign    Within the past 12 months, you worried that your food would run out before you got the money to buy more.: Never true    Within the past 12 months, the food you bought just didn't last and you didn't have money to get more.: Never true  Transportation Needs: No Transportation Needs (06/18/2023)   Received from [facility] - Transportation    In the past 12  months, has lack of transportation kept you from medical appointments or from getting medications?: No    Lack of Transportation (Non-Medical): No  Physical Activity: Inactive (05/27/2023)   Exercise Vital Sign    Days of Exercise per Week: 0 days    Minutes of Exercise per Session: 0 min  Stress: No Stress Concern Present (05/27/2023)   [employer] of Occupational Health - Occupational Stress Questionnaire    Feeling of Stress : Not at all  Social Connections: Moderately Isolated (05/27/2023)   Social Connection and Isolation Panel    Frequency of Communication with Friends and Family: More than three times a week    Frequency of Social Gatherings with Friends and Family: Once a week    Attends Religious Services: 1 to 4 times per year    Active Member of [employer] or Organizations: No    Attends Banker Meetings: Never    Marital Status: Widowed  Intimate Partner Violence: Not At Risk (02/05/2023)   Humiliation, Afraid, Rape, and Kick questionnaire    Fear of Current or Ex-Partner: No    Emotionally Abused: No    Physically Abused: No    Sexually Abused: No    FAMILY HISTORY: Family History  Problem Relation Age of Onset   Diabetes Mother    Hypertension Mother    Heart disease Mother    Cancer Mother        breast and stomach   Breast cancer Mother 39   Diabetes Father    Hypertension Father    Heart disease Father    Kidney disease Father     ALLERGIES:  is allergic to diclofenac, lisinopril, and sulfa  antibiotics.  MEDICATIONS:  Current Outpatient Medications  Medication Sig Dispense Refill   chlorpheniramine-HYDROcodone (TUSSIONEX) 10-8 MG/5ML Take 5 mLs by mouth at bedtime as needed for cough. 140 mL 0   albuterol  (VENTOLIN  HFA) 108 (90 Base) MCG/ACT inhaler Inhale 2 puffs into the lungs every 6 (six) hours as needed for wheezing or shortness of breath. 18 g 0   aspirin  EC 81 MG tablet Take 1 tablet (81 mg total) by mouth daily. Swallow whole. 30 tablet  12   Azelastine  HCl 137 MCG/SPRAY SOLN Place 2 sprays into both nostrils 2 (two) times daily.     calcium  carbonate (OSCAL) 1500 (600 Ca) MG TABS tablet Take 600 mg of elemental calcium  by mouth 3 (three) times daily with meals.     cetirizine (ZYRTEC) 5 MG tablet Take 5 mg by mouth daily.     Cholecalciferol (VITAMIN D -3 PO) Take by mouth.  dexamethasone  (DECADRON ) 2 MG tablet Take 1 tablet (2 mg total) by mouth daily. (Patient not taking: Reported on 11/10/2023) 14 tablet 0   fluticasone  (FLONASE ) 50 MCG/ACT nasal spray USE TWO SPRAYS IN EACH NOSTRIL ONCE DAILY prn 16 g 12   lactulose  (CHRONULAC ) 10 GM/15ML solution Take 15 mLs (10 g total) by mouth 3 (three) times daily. 236 mL 0   lansoprazole  (PREVACID ) 15 MG capsule Take 1 capsule (15 mg total) by mouth daily at 12 noon for 12 days. (Patient not taking: Reported on 11/10/2023) 12 capsule 0   lidocaine -prilocaine  (EMLA ) cream Apply topically.     losartan  (COZAAR ) 100 MG tablet Take 1 tablet (100 mg total) by mouth daily. 90 tablet 3   magic mouthwash w/lidocaine  SOLN Take 5 mLs by mouth 4 (four) times daily as needed for mouth pain. Sig: Swish/Swallow 5-10 ml four times a day as needed. Dispense 480 ml. 1RF (Patient not taking: Reported on 11/10/2023) 480 mL 1   naloxone  (NARCAN ) nasal spray 4 mg/0.1 mL SPRAY 1 SPRAY INTO ONE NOSTRIL AS DIRECTED FOR OPIOID OVERDOSE (TURN PERSON ON SIDE AFTER DOSE. IF NO RESPONSE IN 2-3 MINUTES OR PERSON RESPONDS BUT RELAPSES, REPEAT USING A NEW SPRAY DEVICE AND SPRAY INTO THE OTHER NOSTRIL. CALL 911 AFTER USE.) * EMERGENCY USE ONLY * (Patient not taking: Reported on 11/10/2023) 1 each 0   ondansetron  (ZOFRAN ) 8 MG tablet Take 1 tablet (8 mg total) by mouth every 8 (eight) hours as needed for nausea or vomiting. 30 tablet 3   oxyCODONE  (OXY IR/ROXICODONE ) 5 MG immediate release tablet Take 1-2 tablets (5-10 mg total) by mouth every 4 (four) hours as needed for severe pain (pain score 7-10). 60 tablet 0   oxyCODONE   ER (XTAMPZA  ER) 9 MG C12A Take 1 capsule by mouth every 12 (twelve) hours. 60 capsule 0   senna (SENOKOT) 8.6 MG TABS tablet Take 1 tablet by mouth daily.     No current facility-administered medications for this visit.   Facility-Administered Medications Ordered in Other Visits  Medication Dose Route Frequency Provider Last Rate Last Admin   heparin  lock flush 100 UNIT/ML injection            heparin  lock flush 100 unit/mL  500 Units Intravenous Once Agrawal, Kavita, MD       sodium chloride  flush (NS) 0.9 % injection 10 mL  10 mL Intravenous Once Agrawal, Kavita, MD        PHYSICAL EXAMINATION:   Vitals:   11/10/23 0909  BP: 118/74  Pulse: (!) 114  Resp: 18  Temp: 97.7 F (36.5 C)  SpO2: 100%   Filed Weights   11/10/23 0909  Weight: 125 lb 12.8 oz (57.1 kg)    Physical Exam Vitals and nursing note reviewed.  HENT:     Head: Normocephalic and atraumatic.     Mouth/Throat:     Pharynx: Oropharynx is clear.  Eyes:     Extraocular Movements: Extraocular movements intact.     Pupils: Pupils are equal, round, and reactive to light.  Cardiovascular:     Rate and Rhythm: Normal rate and regular rhythm.  Pulmonary:     Comments: Decreased breath sounds bilaterally.  Abdominal:     Palpations: Abdomen is soft.  Musculoskeletal:        General: Normal range of motion.     Cervical back: Normal range of motion.  Skin:    General: Skin is warm.  Neurological:     General: No focal  deficit present.     Mental Status: She is alert and oriented to person, place, and time.  Psychiatric:        Behavior: Behavior normal.        Judgment: Judgment normal.     LABORATORY DATA:  I have reviewed the data as listed Lab Results  Component Value Date   WBC 2.9 (L) 11/10/2023   HGB 9.1 (L) 11/10/2023   HCT 27.5 (L) 11/10/2023   MCV 100.7 (H) 11/10/2023   PLT 85 (L) 11/10/2023   Recent Labs    10/03/23 0920 10/23/23 1444 11/10/23 0915  NA 133* 126* 131*  K 3.9 3.9 3.9   CL 106 100 105  CO2 17* 20* 18*  GLUCOSE 119* 142* 115*  BUN 18 16 22   CREATININE 1.22* 1.01* 1.13*  CALCIUM  8.5* 8.0* 8.5*  GFRNONAA 47* 60* 52*  PROT 7.0 6.6 6.5  ALBUMIN 3.6 3.4* 3.5  AST 24 22 22   ALT 11 14 14   ALKPHOS 307* 318* 327*  BILITOT 0.6 0.8 0.5    RADIOGRAPHIC STUDIES: I have personally reviewed the radiological images as listed and agreed with the findings in the report. CT CHEST ABDOMEN PELVIS WO CONTRAST Result Date: 11/02/2023 CLINICAL DATA:  Restaging for adenocarcinoma of lung. EXAM: CT CHEST, ABDOMEN AND PELVIS WITHOUT CONTRAST TECHNIQUE: Multidetector CT imaging of the chest, abdomen and pelvis was performed following the standard protocol without IV contrast. RADIATION DOSE REDUCTION: This exam was performed according to the departmental dose-optimization program which includes automated exposure control, adjustment of the mA and/or kV according to patient size and/or use of iterative reconstruction technique. COMPARISON:  PET-CT 09/02/2023, CT abdomen and pelvis with IV contrast 06/02/2023, PET-CT 05/16/2023, and CT chest, abdomen and pelvis no contrast 02/24/2023. FINDINGS: CT CHEST FINDINGS Cardiovascular: Right IJ port catheter again terminates about the superior cavoatrial junction. The cardiac size is normal. Pulmonary arteries and veins are nondilated. No visible coronary calcification. Trace aortic atherosclerosis is seen without aneurysm. Normal three-vessel aortic arch. No pericardial effusion. Mediastinum/Nodes: 8 mm in short axis precarinal lymph node on 2:24, was previously 5 mm. There is a lymph node in outer AP window on this same image measuring 8 mm, was previously 4 mm. No other intrathoracic adenopathy is seen without contrast. Evaluation of the hila is limited. Lungs/Pleura: The dominant coalescent suprahilar left upper lobe mass anteriorly, today on 4:49 measuring 3.4 x 2.9 cm, stable since 09/02/2023 but on 05/16/2023 measuring 2.6 x 2.7 cm. There is a  ground-glass nodule lateral to this mass measuring 1.3 cm on 4:50, previously 1 cm. There are innumerable randomly distributed bilateral lung nodules most numerous in the upper lobes, compatible with metastases. These are increased in numbers and sizes in the upper lobes compared with 05/16/2023 and have increased in number in the lower lobes since the January and February 2025 studies. There is no notable change from 09/02/2023. These nodules the measure up to 1.1 cm in the left apex on 4:39, in the right upper lobe up to 7 mm anteriorly on 4:54, in the left lower lobe largest is 9 mm in the superior segment on 4:47, largest right lower lobe nodule is 9 mm on 4:76. Index nodule in the right middle lobe is 5 mm on 4:99. There are coarse linear scar-like markings in both apices, both bases. No consolidation or effusion. Musculoskeletal: There is mild the appearance consistent with metastatic disease in the C6 and C7 vertebrae, seen previously, and in the left anterolateral third rib  also present previously. There is lytic involvement of the posterior T11 vertebral body and both lytic and blastic lesions again in the anterior and posterior aspect of T12., seen previously. CT ABDOMEN PELVIS FINDINGS Hepatobiliary: No liver metastasis is seen without contrast. Uncomplicated cholelithiasis. Pancreas: No masses seen without contrast. Spleen: No mass is seen without contrast. Adrenals/Urinary Tract: No adrenal or renal mass is seen without contrast. Stable 2.2 cm parapelvic Bosniak 1 cyst inferior pole right kidney, Hounsfield density is 7. There is no urinary stone or obstruction. No bladder thickening. Stomach/Bowel: Moderate gastric thickening again noted proximally. No bowel obstruction or inflammation. Normal appendix. Moderate fecal stasis. No evidence for colitis or diverticulitis. Vascular/Lymphatic: Aortic atherosclerosis. No enlarged abdominal or pelvic lymph nodes. Reproductive: Uterus and bilateral adnexa are  unremarkable. Other: Umbilical fat hernia.  No free fluid.  No entrapped hernia. Musculoskeletal: Widespread metastatic involvement again noted lumbar spine, sacrum, bony pelvis and proximal femurs. Intramedullary left femoral rod partially visible. There are compression fractures at L1 and 3, mild at L1 and mild-to-moderate at L3 but no new, further or progressive compression deformity. No new pathologic fracture is seen. IMPRESSION: 1. 3.4 x 2.9 cm coalescent suprahilar left upper lobe mass, stable since 09/02/2023 but increased in size since 05/16/2023. 2. 1.3 cm ground-glass nodule lateral to the suprahilar left upper lobe mass, previously 1 cm. 3. Innumerable randomly distributed bilateral lung nodules most numerous in the upper lobes, increased in number and sizes in the upper lobes compared with 05/16/2023 and increased in number in the lower lobes since the January and February 2025 studies. No notable change from 09/02/2023 although there could be some new tiny nodules allowing for suboptimal lung evaluation on the PET-CT. 4. 8 mm precarinal and AP window lymph nodes, increased in size from 09/02/2023. 5. Widespread osseous metastatic disease, seen previously. 6. No noncontrast evidence of metastatic disease in the abdomen or pelvis except for osseous structures. 7. Cholelithiasis. 8. Constipation. 9. Umbilical fat hernia. 10. Stable 2.2 cm parapelvic Bosniak 1 cyst inferior pole right kidney. 11. Aortic atherosclerosis. Aortic Atherosclerosis (ICD10-I70.0). Electronically Signed   By: Francis Quam M.D.   On: 11/02/2023 04:02     Primary malignant neoplasm of left upper lobe of lung (HCC)  # Primary lung adenocarcinoma Stage IV, PDL1 1% [11/19/2021 after transbronchial biopsy of LUL nodule by Dr. Emerson.  Metastatic to pelvis, bilateral hips R>L and numerous pulmonary nodules. MRI brain showed no intracranial mets. 1 cm indeterminate left frontal calvarium lesion.  Liquid biopsy JUNE 2025- - NEG  for any targetable mutations.  # JULY 2025-progressive metastatic disease in the lungs-patient mildly symptomatic with worsening cough.  # Patient currently status post multiple lines of therapy  carbo Alimta  Keytruda ; gemcitabine . CarboTaxol bevacizumab  in the past.  Unfortunately further treatments treatment complicated by persistent cytopenias requiring dose reductions and dose delays.  Today-CBC still not recovered back to baseline, last chemotherapy May 2025.    # I had a long discussion with the patient and family given the incurable nature of the disease /poor tolerance of therapy. I introduced hospice philosophy to the patient and family.  Discussed that goal of care should be directed to symptom management rather than treating the underlying disease; and in the process help improve quality of life rather than quantity.  Patient/family reluctantly agreed to have meeting with hospice.  Hospice referral made.  Given the progressive cough recommend Tussionex and prescription sent.  # Brain metastases-with cranial palsy s/p radiation-continues to have numbness on the  left side of the face likely from the underlying malignancy.  Status post radiation-May 2025.  Currently awaiting reevaluation with [facility] neuro-ophthalmology.  Per patient/family wishes-will order MRI brain prior to the visit and end of October.  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  Currently no pain.   # Access - port  # DISPOSITION: # de-access # recommend hospice  re: lung cancer #  PLEASE SCHEDULE MRI brain- in third week of AUG # FOLLOW UP WITH Dr.Rao- in thrid week of AUG after Brain MRI- labs- port- cbc/cmp;- Dr.B  # I reviewed the blood work- with the patient in detail; also reviewed the imaging independently [as summarized above]; and with the patient in detail.    # 40 minutes face-to-face with the patient discussing the above plan of care; more than  50% of time spent on prognosis/ natural history; counseling and coordination.     Above plan of care was discussed with patient/family in detail.  My contact information was given to the patient/family.     Cindy JONELLE Joe, MD 11/10/2023 1:02 PM

## 2023-11-10 NOTE — Progress Notes (Signed)
 Patient had a CT scan a couple of weeks ago and has no new or acute concerns at this time.

## 2023-11-10 NOTE — Addendum Note (Signed)
 Addended by: LAEL BROWNING A on: 11/10/2023 10:30 AM   Modules accepted: Orders

## 2023-11-12 ENCOUNTER — Telehealth: Payer: Self-pay | Admitting: *Deleted

## 2023-11-12 NOTE — Telephone Encounter (Signed)
 Good morning! Yesterday afternoon, I had a conversation with the daughter, Madelin. She is requesting the patient go to her neuro ophthalmologist appointment on August 28th. I explained that hospice would not cover this visit. Family is requesting a follow up call after the appointment. If the daughter is able to make an appointment for the patient to be seen earlier, she is going to do so. [facility] will continue to hold this referral until the patient is formally scheduled with an admission visit. If you have any questions, feel free to give me a call at [phone]. Thank you!  Per [facility].

## 2023-11-13 ENCOUNTER — Encounter: Payer: Self-pay | Admitting: Internal Medicine

## 2023-11-16 ENCOUNTER — Encounter: Payer: Self-pay | Admitting: Internal Medicine

## 2023-11-16 ENCOUNTER — Encounter: Payer: Self-pay | Admitting: *Deleted

## 2023-11-16 ENCOUNTER — Emergency Department: Admission: EM | Admit: 2023-11-16 | Discharge: 2023-11-16 | Disposition: A | Discharge status: Home / Self Care

## 2023-11-16 ENCOUNTER — Emergency Department

## 2023-11-16 ENCOUNTER — Other Ambulatory Visit: Payer: Self-pay

## 2023-11-16 DIAGNOSIS — E875 Hyperkalemia: Secondary | ICD-10-CM | POA: Insufficient documentation

## 2023-11-16 DIAGNOSIS — W01198A Fall on same level from slipping, tripping and stumbling with subsequent striking against other object, initial encounter: Secondary | ICD-10-CM | POA: Insufficient documentation

## 2023-11-16 DIAGNOSIS — R590 Localized enlarged lymph nodes: Secondary | ICD-10-CM | POA: Diagnosis not present

## 2023-11-16 DIAGNOSIS — R000 Tachycardia, unspecified: Secondary | ICD-10-CM | POA: Diagnosis not present

## 2023-11-16 DIAGNOSIS — Z85118 Personal history of other malignant neoplasm of bronchus and lung: Secondary | ICD-10-CM | POA: Insufficient documentation

## 2023-11-16 DIAGNOSIS — Z85841 Personal history of malignant neoplasm of brain: Secondary | ICD-10-CM | POA: Diagnosis not present

## 2023-11-16 DIAGNOSIS — E871 Hypo-osmolality and hyponatremia: Secondary | ICD-10-CM | POA: Diagnosis not present

## 2023-11-16 DIAGNOSIS — R410 Disorientation, unspecified: Secondary | ICD-10-CM | POA: Diagnosis present

## 2023-11-16 DIAGNOSIS — N189 Chronic kidney disease, unspecified: Secondary | ICD-10-CM | POA: Diagnosis not present

## 2023-11-16 DIAGNOSIS — C799 Secondary malignant neoplasm of unspecified site: Secondary | ICD-10-CM

## 2023-11-16 DIAGNOSIS — S0083XA Contusion of other part of head, initial encounter: Secondary | ICD-10-CM | POA: Diagnosis not present

## 2023-11-16 DIAGNOSIS — C7951 Secondary malignant neoplasm of bone: Secondary | ICD-10-CM | POA: Diagnosis not present

## 2023-11-16 DIAGNOSIS — I129 Hypertensive chronic kidney disease with stage 1 through stage 4 chronic kidney disease, or unspecified chronic kidney disease: Secondary | ICD-10-CM | POA: Diagnosis not present

## 2023-11-16 DIAGNOSIS — R591 Generalized enlarged lymph nodes: Secondary | ICD-10-CM | POA: Diagnosis not present

## 2023-11-16 DIAGNOSIS — R4701 Aphasia: Secondary | ICD-10-CM | POA: Insufficient documentation

## 2023-11-16 DIAGNOSIS — I517 Cardiomegaly: Secondary | ICD-10-CM | POA: Diagnosis not present

## 2023-11-16 DIAGNOSIS — I6782 Cerebral ischemia: Secondary | ICD-10-CM | POA: Diagnosis not present

## 2023-11-16 DIAGNOSIS — C3490 Malignant neoplasm of unspecified part of unspecified bronchus or lung: Secondary | ICD-10-CM | POA: Diagnosis not present

## 2023-11-16 DIAGNOSIS — W19XXXA Unspecified fall, initial encounter: Secondary | ICD-10-CM

## 2023-11-16 LAB — URINALYSIS, COMPLETE (UACMP) WITH MICROSCOPIC
Bacteria, UA: NONE SEEN
Bilirubin Urine: NEGATIVE
Glucose, UA: NEGATIVE mg/dL
Hgb urine dipstick: NEGATIVE
Ketones, ur: 5 mg/dL — AB
Leukocytes,Ua: NEGATIVE
Nitrite: NEGATIVE
Protein, ur: NEGATIVE mg/dL
Specific Gravity, Urine: 1.025 (ref 1.005–1.030)
pH: 5 (ref 5.0–8.0)

## 2023-11-16 LAB — CBC WITH DIFFERENTIAL/PLATELET
Abs Immature Granulocytes: 0.06 K/uL (ref 0.00–0.07)
Basophils Absolute: 0 K/uL (ref 0.0–0.1)
Basophils Relative: 0 %
Eosinophils Absolute: 0 K/uL (ref 0.0–0.5)
Eosinophils Relative: 0 %
HCT: 31.3 % — ABNORMAL LOW (ref 36.0–46.0)
Hemoglobin: 10.9 g/dL — ABNORMAL LOW (ref 12.0–15.0)
Immature Granulocytes: 1 %
Lymphocytes Relative: 10 %
Lymphs Abs: 0.7 K/uL (ref 0.7–4.0)
MCH: 33.4 pg (ref 26.0–34.0)
MCHC: 34.8 g/dL (ref 30.0–36.0)
MCV: 96 fL (ref 80.0–100.0)
Monocytes Absolute: 0.6 K/uL (ref 0.1–1.0)
Monocytes Relative: 8 %
Neutro Abs: 5.5 K/uL (ref 1.7–7.7)
Neutrophils Relative %: 81 %
Platelets: 101 K/uL — ABNORMAL LOW (ref 150–400)
RBC: 3.26 MIL/uL — ABNORMAL LOW (ref 3.87–5.11)
RDW: 12.9 % (ref 11.5–15.5)
WBC: 6.8 K/uL (ref 4.0–10.5)
nRBC: 0 % (ref 0.0–0.2)

## 2023-11-16 LAB — PROTIME-INR
INR: 1.2 (ref 0.8–1.2)
Prothrombin Time: 15.7 s — ABNORMAL HIGH (ref 11.4–15.2)

## 2023-11-16 LAB — COMPREHENSIVE METABOLIC PANEL WITH GFR
ALT: 23 U/L (ref 0–44)
AST: 38 U/L (ref 15–41)
Albumin: 3.9 g/dL (ref 3.5–5.0)
Alkaline Phosphatase: 425 U/L — ABNORMAL HIGH (ref 38–126)
Anion gap: 13 (ref 5–15)
BUN: 31 mg/dL — ABNORMAL HIGH (ref 8–23)
CO2: 19 mmol/L — ABNORMAL LOW (ref 22–32)
Calcium: 9.6 mg/dL (ref 8.9–10.3)
Chloride: 94 mmol/L — ABNORMAL LOW (ref 98–111)
Creatinine, Ser: 1.67 mg/dL — ABNORMAL HIGH (ref 0.44–1.00)
GFR, Estimated: 32 mL/min — ABNORMAL LOW (ref >60)
Glucose, Bld: 116 mg/dL — ABNORMAL HIGH (ref 70–99)
Potassium: 5.3 mmol/L — ABNORMAL HIGH (ref 3.5–5.1)
Sodium: 126 mmol/L — ABNORMAL LOW (ref 135–145)
Total Bilirubin: 0.9 mg/dL (ref 0.0–1.2)
Total Protein: 7.3 g/dL (ref 6.5–8.1)

## 2023-11-16 LAB — SODIUM, URINE, RANDOM: Sodium, Ur: 27 mmol/L

## 2023-11-16 LAB — LIPASE, BLOOD: Lipase: 28 U/L (ref 11–51)

## 2023-11-16 MED ORDER — HEPARIN SOD (PORK) LOCK FLUSH 100 UNIT/ML IV SOLN
500.0000 [IU] | Freq: Once | INTRAVENOUS | Status: AC
  Administered 2023-11-16: 500 [IU] via INTRAVENOUS
  Filled 2023-11-16: qty 5

## 2023-11-16 MED ORDER — SODIUM CHLORIDE 0.9 % IV BOLUS
500.0000 mL | Freq: Once | INTRAVENOUS | Status: AC
  Administered 2023-11-16: 500 mL via INTRAVENOUS

## 2023-11-16 MED ORDER — IOHEXOL 350 MG/ML SOLN
50.0000 mL | Freq: Once | INTRAVENOUS | Status: AC | PRN
  Administered 2023-11-16: 50 mL via INTRAVENOUS

## 2023-11-16 MED ORDER — ACETAMINOPHEN 500 MG PO TABS
1000.0000 mg | ORAL_TABLET | Freq: Once | ORAL | Status: AC
  Administered 2023-11-16: 1000 mg via ORAL
  Filled 2023-11-16: qty 2

## 2023-11-16 MED ORDER — SODIUM CHLORIDE 0.9 % IV BOLUS
250.0000 mL | Freq: Once | INTRAVENOUS | Status: AC
  Administered 2023-11-16: 250 mL via INTRAVENOUS

## 2023-11-16 NOTE — ED Triage Notes (Addendum)
 Pt to triage via wheelchair.  Daughter report pt fell today while ambulating with a walker.  Pt has stage 4 lung cancer with mets to the brain.  Family report pt had difficulty with speech and confusion yesterday since 6pm.  Pt has abrasion to right forehead.   No loc.  No vomiting.  Pt denies neck or back pain. Pt fell on the deck today outside.  Pt alert.  Pt has a port.  Pt taken to room 4.

## 2023-11-16 NOTE — Discharge Instructions (Addendum)
 Your evaluation in the emergency department was most notable for a low sodium level, and this may have contributed to your difficulty speaking today.  We send no clear evidence of a stroke, and your symptoms seem to have improved with IV fluid.  Given your goals of care are now prioritizing comfort and that you are in the process of being enrolled in a hospice program, you have been discharged home in accordance with these wishes.  Please do follow-up with your hospice service, primary care provider, and oncologist for reevaluation, and return to the emergency department with any new or worsening symptoms.

## 2023-11-16 NOTE — ED Notes (Signed)
 MD at bedside.

## 2023-11-16 NOTE — Progress Notes (Signed)
 Daughter called the patient had cognitive decline and also fall.  Patient recently declined hospice.  Recommend further evaluation emergency room.  Daughter verbalized understanding.

## 2023-11-16 NOTE — ED Provider Notes (Signed)
 [facility] Provider Note    Event Date/Time   First MD Initiated Contact with Patient 11/16/23 1518     (approximate)   History   Fall  Pt to triage via wheelchair.  Daughter report pt fell today while ambulating with a walker.  Pt has stage 4 lung cancer with mets to the brain.  Family report pt had difficulty with speech and confusion yesterday since 6pm.  Pt has abrasion to right forehead.   No loc.  No vomiting.  Pt denies neck or back pain. Pt fell on the deck today outside.  Pt alert.  Pt has a port.  Pt taken to room 4.     HPI Victoria Foley is a 73 y.o. 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  Has notable word finding difficulty and seems more confused than usual.  Have not noticed any obvious facial droop.  They have started conversations with a hospice service though hospice has not formally been initiated yet. - About an hour to hour prior to arrival, patient walked outside with her walker and tripped over a step, fell forward, hit her head, no loss of consciousness.  Not on blood thinners. - No recent infectious symptoms - Confirmed DNR/DNI status.  One of the patient's daughters at bedside is her HPOA. - Was pending neuro-ophthalmology eval for possible cranial nerve VI palsy  Oncology note from 11/10/2023 reviewed.  Noted to have been treated with multiple lines of therapy, currently not on therapy due to poor bone marrow results.  Noted to have ongoing cough, no blood or phlegm.  Recent CT scan with no interval change.  Patient does note intermittent double vision.  Note that metastases are present in pelvis, bilateral hips, numerous pulmonary nodules.  No intracranial mets on recent MRI brain (June) though  did have an indeterminate left 1 cm frontal calvarial lesion.  Last chemotherapy May/2025.  Reportedly had long goals of care discussion with family, hospice referral made, recommended symptomatic management rather than aiming to treat underlying disease.     Physical Exam   Triage Vital Signs: ED Triage Vitals  Encounter Vitals Group     BP 11/16/23 1510 (!) 109/58     Girls Systolic BP Percentile --      Girls Diastolic BP Percentile --      Boys Systolic BP Percentile --      Boys Diastolic BP Percentile --      Pulse Rate 11/16/23 1510 (!) 133     Resp 11/16/23 1510 18     Temp 11/16/23 1510 97.8 F (36.6 C)     Temp Source 11/16/23 1510 Oral     SpO2 11/16/23 1510 99 %     Weight 11/16/23 1507 125 lb (56.7 kg)     Height 11/16/23 1507 4' 11 (1.499 m)     Head Circumference --      Peak Flow --      Pain Score 11/16/23 1507 9     Pain Loc --      Pain Education --      Exclude from Growth Chart --     Most recent vital signs: Vitals:   11/16/23 2000 11/16/23 2030  BP: (!) 127/90 133/84  Pulse: (!) 120 (!) 111  Resp: 19 (!) 30  Temp:    SpO2: 100% 100%     General: Awake, no distress.  HEENT: Contusion to right forehead, otherwise atraumatic.  No midline neck pain. CV:  Good peripheral perfusion.  Tachycardic, regular rhythm, RP 2+.  Chest wall stable, no tenderness to palpation. Resp:  Normal effort. CTAB Abd:  No distention. Nontender to deep palpation throughout Neuro:  Alert, unclear orientation, CN II-XII intact except for apparent bilateral cranial nerve VI palsy (unable to look laterally with both eyes) with possible mild L nasolabial flattening, + ataxia right upper extremity (FNF wnl LUE), finger taps fast b/l, 5/5 strength in bilateral finger grip, BUE AG 10+ sec no drift, BLE AG 5+ sec no drift. Ambulates with steady gait. SILT. Negative Rhomberg.  Other:  Pelvis stable.  No tenderness palpation throughout bilateral upper and lower extremities.  1a   Level of consciousness: 0=alert; keenly responsive  1b. LOC questions:  0=Performs both tasks correctly  1c. LOC commands: 0=Performs both tasks correctly  2.  Best Gaze: 2=forced deviation, or total gaze paresis not overcome by oculocephalic maneuver  3.  Visual: 0=No visual loss  4. Facial Palsy: 1=Minor paralysis (flattened nasolabial fold, asymmetric on smiling)  5a.  Motor left arm: 0=No drift, limb holds 90 (or 45) degrees for full 10 seconds  5b.  Motor right arm: 0=No drift, limb holds 90 (or 45) degrees for full 10 seconds  6a. motor left leg: 0=No drift, limb holds 90 (or 45) degrees for full 10 seconds  6b  Motor right leg:  0=No drift, limb holds 90 (or 45) degrees for full 10 seconds  7. Limb Ataxia: 1=Present in one limb  8.  Sensory: 0=Normal; no sensory loss  9. Best Language:  1=Mild to moderate aphasia; some obvious loss of fluency or facility of comprehension without significant limitation on ideas expressed or form of expression.  10. Dysarthria: 0=Normal  11. Extinction and Inattention: 0=No abnormality  12. Distal motor function: 0=Normal   Total:   5      ED Results / Procedures / Treatments   Labs (all labs ordered are listed, but only abnormal results are displayed) Labs Reviewed  CBC WITH DIFFERENTIAL/PLATELET - Abnormal; Notable for the following components:      Result Value   RBC 3.26 (*)    Hemoglobin 10.9 (*)    HCT 31.3 (*)    Platelets 101 (*)    All other components within normal limits  PROTIME-INR - Abnormal; Notable for the following components:   Prothrombin Time 15.7 (*)    All other components within normal limits  COMPREHENSIVE METABOLIC PANEL WITH GFR - Abnormal; Notable for the following components:   Sodium 126 (*)    Potassium 5.3 (*)    Chloride 94 (*)    CO2 19 (*)    Glucose, Bld 116 (*)    BUN 31 (*)    Creatinine, Ser 1.67 (*)    Alkaline Phosphatase 425 (*)    GFR, Estimated 32 (*)    All other components within normal  limits  URINALYSIS, COMPLETE (UACMP) WITH MICROSCOPIC - Abnormal; Notable for the following components:   Color, Urine YELLOW (*)    APPearance CLEAR (*)    Ketones, ur 5 (*)    All other components within normal limits  LIPASE, BLOOD  SODIUM, URINE, RANDOM  OSMOLALITY, URINE  OSMOLALITY     EKG  See ED course below.   RADIOLOGY  Radiology interpreted by myself  and radiology report reviewed.  No acute pathology identified.   PROCEDURES:  Critical Care performed: No  Procedures   MEDICATIONS ORDERED IN ED: Medications  sodium chloride  0.9 % bolus 500 mL (0 mLs Intravenous Stopped 11/16/23 1900)  acetaminophen  (TYLENOL ) tablet 1,000 mg (1,000 mg Oral Given 11/16/23 1559)  iohexol  (OMNIPAQUE ) 350 MG/ML injection 50 mL (50 mLs Intravenous Contrast Given 11/16/23 1720)  sodium chloride  0.9 % bolus 250 mL (0 mLs Intravenous Stopped 11/16/23 2057)     IMPRESSION / MDM / ASSESSMENT AND PLAN / ED COURSE  I reviewed the triage vital signs and the nursing notes.                              DDX/MDM/AP: Differential diagnosis includes, but is not limited to, subacute CVA given new expressive aphasia and mild left nasolabial flattening with right upper extremity ataxia -- NIHSS 5.  Outside of acute stroke window.  Consider possible underlying metastatic disease, intracranial hemorrhage.  With regard to tachycardia, consider underlying PE given known malignancy though it does not appear patient lost consciousness and fortunately is normotensive here.  Consider underlying infection though does not appear febrile here and no obvious infectious symptoms.  Does not appear to be an arrhythmia, sinus tach on EKG.  Plan: - EKG - Labs - CTA head and neck - CT PE - IV fluid, Tylenol  - N.p.o. - Reassess, continue goals of care discussion pending results with  Patient's presentation is most consistent with acute presentation with potential threat to life or bodily function.  The patient  is on the cardiac monitor to evaluate for evidence of arrhythmia and/or significant heart rate changes.  ED course below.  Workup with notable hyponatremia to 126, given small bolus IV fluid.  Workup otherwise reassuring, mild hyperkalemia, also treated with small bolus IV fluid.  CTA head and neck with no acute pathology identified, redemonstration of known intracranial metastases.  Patient's mentation did notably improve while here in emergency department, appears to be back at baseline.  Consider recrudescence of prior intracranial injury or due to insult from known metastases in the setting of hyponatremia likely mild dehydration.  I did have extensive goals of care discussions with family and patient, priority us  to stay at home, would not want aggressive interventions and does not want further aggressive workup of presentation today--declined admission.  Will proceed with outpatient hospice enrollment.  They do plan to follow-up with patient's outpatient providers for ongoing symptomatic management.  Patient given a total of 750 cc IV fluid here and does appear much better than arrival.  Will proceed with discharge home per patient and family request which I believe is very reasonable in this clinical context.  Clinical Course as of 11/16/23 2111  Sun Nov 16, 2023  1529 Ecg = sinus tachycardia, rate 138, no gross ST elevation or depression, no significant repolarization abnormality, some left axis deviation present.  Normal intervals.  No evidence of ischemia nor arrhythmia on my interpretation. [MM]  1705 CMP with AKI on CKD as well as mild hyperkalemia and moderate hyponatremia.  No associated EKG changes appreciated.  Getting small bolus IV fluid. [MM]  1705 CBC at baseline [MM]  1808 CTA chest: IMPRESSION: 1. No evidence for pulmonary embolism. 2. Mild increase in size of the left upper lobe mass. 3. Diffuse nodular opacities throughout both lungs have increased in size and number  compatible with metastatic disease. 4. 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 5. Mediastinal and hilar lymphadenopathy has increased in size compatible with metastatic disease. 6. Cardiomegaly. 7. Cholelithiasis. 8. Small hiatal hernia with diffuse distal esophageal wall thickening. Correlate clinically for esophagitis.   [MM]  1906 Patient reevaluated, remains tachycardic to the 120s but mentation appears to have improved, much less word finding difficulty  Updated on results so far, awaiting results of CTA head and neck [MM]  1920 CTA head/neck: IMPRESSION: 1. No evidence of acute intracranial abnormality. 2. Known intracranial and osseous metastatic disease including dural involvement most notable in the middle cranial fossa and similar to the prior MRI. 3. No emergent large vessel occlusion. 4. Mild-to-moderate left A1 stenosis. 5. Focal mild dilatation of the distal right cervical ICA measuring 5 mm in diameter, possibly a small pseudoaneurysm related to remote trauma/dissection or fibromuscular dysplasia. 6. Progressive necrotic bilateral cervical lymphadenopathy.   [MM]  1932 Patient reevaluated, findings discussed.  Patient does remain mentating much better than before and aphasia is notably improved.  Consider possibility of stroke tumor not visualizing on CTA, it is also possible patient may be having recrudescence of prior symptoms in the setting of hyponatremia which is improving with IV fluids here though has had some similarly low levels in the past.  Discussed overall clinical picture with family.  They reiterate priority is to return home, patient is not amenable to admission, family confirms they are in the process of hospice and would prefer ongoing outpatient management.  Given goals of care I believe this is a very reasonable approach and family understands the patient's  clinical status could worsen and potentially result in death.  Unclear etiology of tachycardia today though on review of recent outpatient visits patient does frequently have tachycardia to the 110s to 120s.  Patient will provide urine sample to ensure no underlying UTI, will also plan hyponatremia labs.  Family plans to follow-up with patient's oncology team and can get IV fluid through them as needed.  No clear culprit medications contributing to hyponatremia though patient is on lactulose , can consider decreasing usage as she may be losing fluid due to this.  Will give small 250 cc bolus in addition to 500 cc already given, avoiding overly aggressive rehydration. [MM]  2031 Urinalysis with no evidence of infection [MM]    Clinical Course User Index [MM] Clarine Ozell LABOR, MD     FINAL CLINICAL IMPRESSION(S) / ED DIAGNOSES   Final diagnoses:  Aphasia of unknown origin  Hyponatremia  Fall, initial encounter  Contusion of face, initial encounter  Metastatic malignant neoplasm, unspecified site [facility])     Rx / DC Orders   ED Discharge Orders     None        Note:  This document was prepared using Dragon voice recognition software and may include unintentional dictation errors.   Clarine Ozell LABOR, MD 11/16/23 2111

## 2023-11-16 NOTE — ED Notes (Signed)
 Port was deaccessed due discharge.

## 2023-11-17 ENCOUNTER — Encounter: Payer: Self-pay | Admitting: Internal Medicine

## 2023-11-18 ENCOUNTER — Encounter: Payer: Self-pay | Admitting: Oncology

## 2023-11-18 LAB — OSMOLALITY, URINE: Osmolality, Ur: 321 mosm/kg (ref 300–900)

## 2023-11-19 ENCOUNTER — Encounter: Payer: Self-pay | Admitting: Internal Medicine

## 2023-11-19 NOTE — Telephone Encounter (Addendum)
 Received message via secure chat stating lauren from [employer] calling you back. asked you to call her at [phone].

## 2023-11-19 NOTE — Telephone Encounter (Incomplete Revision)
 Received message via secure chat stating lauren from [facility] calling you back. asked you to call her at [phone].

## 2023-11-26 ENCOUNTER — Encounter: Payer: Self-pay | Admitting: Oncology

## 2023-11-27 ENCOUNTER — Other Ambulatory Visit: Payer: Self-pay | Admitting: Nurse Practitioner

## 2023-11-27 MED ORDER — HYDROCOD POLI-CHLORPHE POLI ER 10-8 MG/5ML PO SUER: 5.0000 mL | Freq: Every evening | ORAL | 0 refills | Status: DC | PRN

## 2023-11-28 ENCOUNTER — Ambulatory Visit: Admitting: Internal Medicine

## 2023-11-28 ENCOUNTER — Telehealth: Payer: Self-pay | Admitting: Family Medicine

## 2023-11-28 NOTE — Telephone Encounter (Signed)
 LMOM and sent MyChart message for pt to call back to schedule a TOC. [facility]

## 2023-12-01 ENCOUNTER — Encounter: Payer: Self-pay | Admitting: Internal Medicine

## 2023-12-01 NOTE — Telephone Encounter (Addendum)
 Per patient my chart message We received the refill. Thanks! Also went to fill the anti-nausea medication but it requires pre-authorization. Can you help?. Tussionex prescribed 8/7 and sent to [facility] in [place]; no other meds recently prescribed. Outbound call to [facility] in [place] [phone]; spoke to Crystal who confirmed the patient picked up only 7 days worth of medication which did not require a pre-authorization.  Crystal indicated anything prescribed over 140 mLs will require a pre-authorization prior to fill.

## 2023-12-02 DIAGNOSIS — H547 Unspecified visual loss: Secondary | ICD-10-CM | POA: Diagnosis not present

## 2023-12-02 DIAGNOSIS — H4923 Sixth [abducent] nerve palsy, bilateral: Secondary | ICD-10-CM | POA: Diagnosis not present

## 2023-12-02 DIAGNOSIS — H25813 Combined forms of age-related cataract, bilateral: Secondary | ICD-10-CM | POA: Diagnosis not present

## 2023-12-02 NOTE — Progress Notes (Signed)
 Victoria Foley is a 73 y.o. y/o female, who presents to neuro-ophthalmology clinic on December 02, 2023 due to diplopia and blurred vision.  [name] is seen in consultation requested by Dr. Dingeldein for evaluation of diplopia.  Patient Care Team: Pcp, Information Unavailable as PCP - General  Doctors who need letters: Dr. Dingeldein   REASON FOR VISIT (Chief Complaint):  Patient reports diplopia starting in early June 2025 after stereotactic radiosurgery to L occipital brain met. Diplopia was sudden in onset and began in early June. It resolved if one eye was occluded. She saw an outside ophthalmologist and was prescribed Fresnel prisms on her current glasses. Since then her vision has become progressively blurrier and the diplopia is no longer occurring.   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 All eventually discontinued due to lab abnormalities or failure of treatment.  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 - None  PMHx: - She  has no past medical history on file. - She does not have a problem list on file.  Surgical Hx: - She  has no past surgical history on file.  Medication List: - She has a current medication list which includes the following prescription(s): aspirin , azelastine , calcium  carbonate, cetirizine, and cholecalciferol (vit d3)(bulk).  Allergies: - She is allergic to  lisinopril and sulfa  (sulfonamide antibiotics).  Social Hx: - She    Family Hx/Pedigree/Heredogram: - She family history is not on file.   Recent Relevant Labs: - N/A  General Imaging: MRI (10/07/2023)  IMPRESSION 1. No substantial change in solitary intra-axial enhancing  metastasis at the junction of the left temporal and occipital lobes.  No new intra-axial lesion identified.  2. Widespread osseous metastases with mildly progressive patchy  meningeal dural involvement, most conspicuous along the bilateral  middle cranial fossa.  3. No new/interval acute abnormality.   MRI 09/04/2023: 1. Solitary intra-axial enhancing metastasis along the inferior  junction of the left temporal/occipital lobes is stable to perhaps 1  mm smaller since 08/08/2023. Stable mild regional edema without mass  effect.  2. Diffuse osseous metastatic disease. 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 No overt cavernous sinus involvement or  other complicating features at this time.  3. No new metastatic disease identified.   EXAM TODAY  - BCVA: Corrected distance visual acuity was 20/80 in the right eye and CF@2ft . in the left eye. Base Eye Exam     Visual Acuity (Snellen - Linear)       Right Left   Dist cc 20/80 CF@2ft .   Dist ph cc 20/60 -2 NI    Correction: Glasses         Tonometry (Icare, 2:11 PM)       Right Left   Pressure 12 10         Pupils  Dark Light Shape React APD   Right 4 3 Round Slow None   Left 2 2 Round Minimal None         Visual Fields       Left Right    Full Full         Neuro/Psych     Oriented x3: Yes   Mood/Affect: Normal         Dilation     Both eyes: 1% Tropicamide, 2.5% Phenylephrine @ 2:56 PM           Additional Tests     Color       Right Left   Ishihara 6/11 0/11   Mostly able to see first number but not second number           Strabismus Exam     Fixing Eye: Right   Correction: Alden    Distance Near Near +3DS N Bifocals    ET 30              -4 0 0  ET 30 0 0 -4                     -4 * 0  ET 30 0 * -4                     -4 0 0  ET 30 0 0 -4              Much larger deviation in left and right gaze    Slit Lamp and Fundus Exam     External Exam       Right Left   External Mild ptosis Mild ptosis         Slit Lamp Exam       Right Left   Lids/Lashes Normal Normal   Conjunctiva/Sclera White and quiet White and quiet   Cornea Clear Clear   Anterior Chamber Deep and quiet Deep and quiet   Iris Round and dilated, no lesions Round and dilated, no lesions   Lens 2+ NS, cortical spokes 2-3+ NS with significant ant cortical opacities   Anterior Vitreous Syneresis Hazy view         Fundus Exam       Right Left   Disc Anomalous shape, no edema, margins crisp, no hemes Hazy view, margins appear sharp   C/D Ratio 0.25 0.25   Macula Normal Normal   Vessels Normal Normal           Refraction     Wearing Rx       Sphere Cylinder Axis Add Horz Prism    Right +1.75 Sphere  +2.75    Left +0.50 +1.50 032 +2.75 10BO Fresnel    Age: 56yrs   Type: Progressive  Left lens has fresnel prism            ANCILLARY TESTING - OCT Retinal Nerve Fiber Layer (RNFL): AT 105/96, wnl - OCT macular Ganglion Cell Complex [facility]): AT 86/84, wnl  ASSESSMENT  # 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 She has hypertension but reports that it is well controlled.  - Suspect that CNVI palsy is  unlikely to  resolve given complete palsy and no improvement since onset  #Decreased vision, left eye #Stage IV lung cancer with L occipital brain metastasis  - Based on chart review, BCVA was 20/80 on 10/21/23, so this is a fairly rapid progression of vision loss. Visually significant cataract is contributing but not severe enough to cause CF vision - Unclear etiology, but may be due to metastatic lesion in L occipital lobe, recent stereotactic radiosurgery to L occipital lobe, or radiation optic neuropathy, although OCT RNFL today wnl without atrophy - Given normal OCT ONH/RNFL and vision loss out of proportion to cataract, suspect occipital lobe lesion is most likely etiology  # Visually significant cataract, bilateral - Patient with 2+NS OD and 2-3+NS OS that are visually significant - Discussed with patient that cataract surgery may improve her vision in the left eye, although likely not significantly - Patient may pursue cataract surgery in the future, but it is not a priority for her currently as she navigates hurdles with her metastatic cancer  PLAN  #Return 2-3mo attending neuro-ophthalmology or [facility] for repeat OCT RNFL/DFE/EOM assessment to evaluate for changes in symptoms and RNFL - patient will schedule appt at [facility] but if able will pursue follow up with local ophthalmologist to reduce travel needs  #Cataract surgery evaluation - patient will reach out when ready for cataract surgery, may decide to follow with local ophthalmologist  Patient has been advised regarding signs and symptoms suggestive of worsening or new problem, to report to the [facility]  Emergency Room immediately.  Advised if there are further questions they can contact us  via [email or web address] or calling our clinical office number [phone] [place], Environmental health practitioner).  Fellow/Resident/Scribe assisting with Chart:  Shelvy Rhea, MD [facility] Department of Ophthalmology PGY-2  Seen with Dr. Berneda   Dear Elspeth Barter Dingeldein  I greatly appreciate the referral.   Please do not hesitate to contact me if you have any questions or concerns or additional information regarding our mutual patient.    Best regards, Lonni Berneda, MD  Neuro-ophthalmologist  PREPARING FOR YOUR NEXT NEURO-OPHTHALMOLOGY EVALUATION Please be advised that Neuro-Ophthalmology evaluation is a highly specialized and thorough process. The time requirement for visits ranges from 3-5 hours. This includes registration, technician evaluation, pupils dilation, special testing, attending/fellow chart review and clinical encounter with attending provider. 1. Request that your treating physicians send all relevant information to the neuro- ophthalmologist prior to your appointment, including office notes, results of laboratory tests and reports of CT and MRI scans. 2. If you have had a CT or MRI scan performed, arrange to pick up the actual films and bring them with you, or have the facility mail them to the neuro-ophthalmologist in advance or your appointment. 3. You will probably have your pupils dilated during the visit. The eye drops last about 4 hours and will make things look bright and blurry up close. Have someone else drive you to the appointment and bring your sunglasses. 4. Ladies, in order for the physician to get a good look at your eyelids and to avoid ruining your appearance when the eye drops are administered, do not wear eye makeup. 5. Bring a complete list of medications with you, including the name and dosage of prescription and over-the-counter medications.  WHAT WILL HAPPEN DURING YOUR NEXT EVALUATION? 1. The neuro-ophthalmologic evaluation is one of the most comprehensive examinations you will experience. It may take a few hours to complete. You will be asked to give an account of  your current problem and relate your entire medical history, including previous hospitalizations, operations, serious  illnesses, medical problems in your family members, and medication allergies.  2. You will have a complete eye examination. This may include testing of your peripheral vision (visual field test). 3. You may have a partial or complete neurologic exam to test your strength, sensation, and coordination.  4. The neuro-ophthalmologist will review the records and scans from previous evaluations, if applicable.  5. After the examination, the neuro-ophthalmolgist will discuss the diagnosis (or possible diagnoses), the need for any additional testing and possible treatment.  *Some images could not be shown.

## 2023-12-03 NOTE — Telephone Encounter (Signed)
 Please schedule for 1L NS over 1 hour

## 2023-12-04 ENCOUNTER — Inpatient Hospital Stay: Attending: Internal Medicine

## 2023-12-04 ENCOUNTER — Encounter: Payer: Self-pay | Admitting: Internal Medicine

## 2023-12-04 ENCOUNTER — Encounter: Payer: Self-pay | Admitting: Oncology

## 2023-12-04 VITALS — BP 118/62 | HR 105 | Temp 97.9°F | Resp 18 | Ht 63.0 in | Wt 211.0 lb

## 2023-12-04 DIAGNOSIS — C3412 Malignant neoplasm of upper lobe, left bronchus or lung: Secondary | ICD-10-CM | POA: Diagnosis not present

## 2023-12-04 DIAGNOSIS — E860 Dehydration: Secondary | ICD-10-CM

## 2023-12-04 LAB — CBC WITH DIFFERENTIAL (CANCER CENTER ONLY)
Abs Immature Granulocytes: 0.05 K/uL (ref 0.00–0.07)
Basophils Absolute: 0 K/uL (ref 0.0–0.1)
Basophils Relative: 1 %
Eosinophils Absolute: 0.1 K/uL (ref 0.0–0.5)
Eosinophils Relative: 1 %
HCT: 28.7 % — ABNORMAL LOW (ref 36.0–46.0)
Hemoglobin: 9.3 g/dL — ABNORMAL LOW (ref 12.0–15.0)
Immature Granulocytes: 1 %
Lymphocytes Relative: 18 %
Lymphs Abs: 0.8 K/uL (ref 0.7–4.0)
MCH: 32.3 pg (ref 26.0–34.0)
MCHC: 32.4 g/dL (ref 30.0–36.0)
MCV: 99.7 fL (ref 80.0–100.0)
Monocytes Absolute: 0.4 K/uL (ref 0.1–1.0)
Monocytes Relative: 8 %
Neutro Abs: 3.2 K/uL (ref 1.7–7.7)
Neutrophils Relative %: 71 %
Platelet Count: 106 K/uL — ABNORMAL LOW (ref 150–400)
RBC: 2.88 MIL/uL — ABNORMAL LOW (ref 3.87–5.11)
RDW: 13.4 % (ref 11.5–15.5)
WBC Count: 4.6 K/uL (ref 4.0–10.5)
nRBC: 0 % (ref 0.0–0.2)

## 2023-12-04 LAB — CMP (CANCER CENTER ONLY)
ALT: 13 U/L (ref 0–44)
AST: 26 U/L (ref 15–41)
Albumin: 3.4 g/dL — ABNORMAL LOW (ref 3.5–5.0)
Alkaline Phosphatase: 399 U/L — ABNORMAL HIGH (ref 38–126)
Anion gap: 9 (ref 5–15)
BUN: 26 mg/dL — ABNORMAL HIGH (ref 8–23)
CO2: 18 mmol/L — ABNORMAL LOW (ref 22–32)
Calcium: 9.1 mg/dL (ref 8.9–10.3)
Chloride: 103 mmol/L (ref 98–111)
Creatinine: 1.23 mg/dL — ABNORMAL HIGH (ref 0.44–1.00)
GFR, Estimated: 47 mL/min — ABNORMAL LOW (ref >60)
Glucose, Bld: 137 mg/dL — ABNORMAL HIGH (ref 70–99)
Potassium: 4.1 mmol/L (ref 3.5–5.1)
Sodium: 130 mmol/L — ABNORMAL LOW (ref 135–145)
Total Bilirubin: 0.6 mg/dL (ref 0.0–1.2)
Total Protein: 6.6 g/dL (ref 6.5–8.1)

## 2023-12-04 MED ORDER — SODIUM CHLORIDE 0.9 % IV SOLN
INTRAVENOUS | Status: DC
  Filled 2023-12-04 (×2): qty 250

## 2023-12-04 NOTE — Telephone Encounter (Signed)
 Per Dr. Melanee Please schedule for 1L NS over 1 hour.

## 2023-12-05 ENCOUNTER — Other Ambulatory Visit: Payer: Self-pay | Admitting: Hospice and Palliative Medicine

## 2023-12-05 MED ORDER — HYDROCOD POLI-CHLORPHE POLI ER 10-8 MG/5ML PO SUER: 5.0000 mL | Freq: Two times a day (BID) | ORAL | Status: DC | PRN

## 2023-12-05 NOTE — Telephone Encounter (Signed)
 Hycodan

## 2023-12-09 ENCOUNTER — Inpatient Hospital Stay: Admission: RE | Admit: 2023-12-09 | Source: Ambulatory Visit

## 2023-12-15 ENCOUNTER — Other Ambulatory Visit: Payer: Self-pay

## 2023-12-15 DIAGNOSIS — C7931 Secondary malignant neoplasm of brain: Secondary | ICD-10-CM

## 2023-12-16 ENCOUNTER — Other Ambulatory Visit

## 2023-12-16 ENCOUNTER — Inpatient Hospital Stay: Admitting: Oncology

## 2023-12-18 ENCOUNTER — Telehealth: Payer: Self-pay | Admitting: *Deleted

## 2023-12-18 NOTE — Telephone Encounter (Signed)
 Nurse called and said that the patient is on long-acting Xtampza  9 mg and it is not on formulary for [facility].  I spoke to [facility] and he said to switch her over to [facility] Contin and have the hospital doctor there to give the Rx for the MS Contin..  I left this message on her voicemail because I could not get in touch with her the 3 times I called

## 2023-12-22 DEATH — deceased

## 2024-01-07 ENCOUNTER — Other Ambulatory Visit

## 2024-01-16 ENCOUNTER — Ambulatory Visit: Admitting: Internal Medicine

## 2024-01-19 ENCOUNTER — Ambulatory Visit: Admitting: Radiation Oncology
